# Patient Record
Sex: Male | Born: 1950
Health system: Southern US, Community
[De-identification: ages and names within clinical notes are randomized; demographics above are authoritative.]

## PROBLEM LIST (undated history)

## (undated) DIAGNOSIS — M5136 Other intervertebral disc degeneration, lumbar region: Secondary | ICD-10-CM

## (undated) DIAGNOSIS — I454 Nonspecific intraventricular block: Secondary | ICD-10-CM

## (undated) DIAGNOSIS — C434 Malignant melanoma of scalp and neck: Secondary | ICD-10-CM

## (undated) DIAGNOSIS — E7800 Pure hypercholesterolemia, unspecified: Secondary | ICD-10-CM

## (undated) DIAGNOSIS — G43109 Migraine with aura, not intractable, without status migrainosus: Secondary | ICD-10-CM

## (undated) DIAGNOSIS — R0683 Snoring: Secondary | ICD-10-CM

## (undated) DIAGNOSIS — M1990 Unspecified osteoarthritis, unspecified site: Secondary | ICD-10-CM

## (undated) DIAGNOSIS — F32A Depression, unspecified: Secondary | ICD-10-CM

## (undated) DIAGNOSIS — I10 Essential (primary) hypertension: Secondary | ICD-10-CM

## (undated) DIAGNOSIS — K2270 Barrett's esophagus without dysplasia: Secondary | ICD-10-CM

## (undated) DIAGNOSIS — R011 Cardiac murmur, unspecified: Secondary | ICD-10-CM

## (undated) DIAGNOSIS — M069 Rheumatoid arthritis, unspecified: Secondary | ICD-10-CM

## (undated) DIAGNOSIS — R7611 Nonspecific reaction to tuberculin skin test without active tuberculosis: Secondary | ICD-10-CM

## (undated) DIAGNOSIS — K219 Gastro-esophageal reflux disease without esophagitis: Secondary | ICD-10-CM

## (undated) DIAGNOSIS — G5600 Carpal tunnel syndrome, unspecified upper limb: Secondary | ICD-10-CM

## (undated) DIAGNOSIS — F329 Major depressive disorder, single episode, unspecified: Secondary | ICD-10-CM

## (undated) DIAGNOSIS — G4730 Sleep apnea, unspecified: Secondary | ICD-10-CM

## (undated) DIAGNOSIS — Z9884 Bariatric surgery status: Secondary | ICD-10-CM

## (undated) DIAGNOSIS — R0602 Shortness of breath: Secondary | ICD-10-CM

## (undated) DIAGNOSIS — I499 Cardiac arrhythmia, unspecified: Secondary | ICD-10-CM

## (undated) DIAGNOSIS — M792 Neuralgia and neuritis, unspecified: Secondary | ICD-10-CM

## (undated) DIAGNOSIS — G2581 Restless legs syndrome: Secondary | ICD-10-CM

## (undated) DIAGNOSIS — Z973 Presence of spectacles and contact lenses: Secondary | ICD-10-CM

## (undated) DIAGNOSIS — E785 Hyperlipidemia, unspecified: Secondary | ICD-10-CM

## (undated) DIAGNOSIS — R51 Headache: Secondary | ICD-10-CM

## (undated) DIAGNOSIS — R2689 Other abnormalities of gait and mobility: Secondary | ICD-10-CM

## (undated) HISTORY — DX: Neuralgia and neuritis, unspecified: M79.2

## (undated) HISTORY — PX: KNEE ARTHROPLASTY: SHX992

## (undated) HISTORY — PX: TONSILLECTOMY: SUR1361

## (undated) HISTORY — DX: Nonspecific intraventricular block: I45.4

## (undated) HISTORY — PX: LAPAROSCOPIC GASTRIC BANDING: SHX1100

## (undated) HISTORY — PX: CIRCUMCISION: SUR203

## (undated) HISTORY — DX: Other abnormalities of gait and mobility: R26.89

## (undated) HISTORY — PX: BASAL CELL CARCINOMA EXCISION: SHX1214

## (undated) HISTORY — DX: Essential (primary) hypertension: I10

## (undated) HISTORY — PX: OTHER SURGICAL HISTORY: SHX169

## (undated) HISTORY — DX: Malignant melanoma of scalp and neck: C43.4

## (undated) HISTORY — DX: Rheumatoid arthritis, unspecified: M06.9

## (undated) HISTORY — PX: UPPER GI ENDOSCOPY: SHX6162

## (undated) HISTORY — DX: Pure hypercholesterolemia, unspecified: E78.00

## (undated) HISTORY — DX: Other intervertebral disc degeneration, lumbar region: M51.36

## (undated) HISTORY — DX: Migraine with aura, not intractable, without status migrainosus: G43.109

---

## 1982-05-22 HISTORY — PX: EXCISION MELANOMA WITH SENTINEL LYMPH NODE BIOPSY: SHX5628

## 2000-12-28 ENCOUNTER — Encounter: Admission: RE | Admit: 2000-12-28 | Discharge: 2001-03-28 | Payer: Self-pay | Admitting: *Deleted

## 2002-04-10 ENCOUNTER — Encounter: Admission: RE | Admit: 2002-04-10 | Discharge: 2002-04-10 | Payer: Self-pay | Admitting: Otolaryngology

## 2002-04-10 ENCOUNTER — Encounter: Payer: Self-pay | Admitting: Otolaryngology

## 2002-05-09 ENCOUNTER — Encounter: Admission: RE | Admit: 2002-05-09 | Discharge: 2002-05-09 | Payer: Self-pay | Admitting: Family Medicine

## 2002-05-09 ENCOUNTER — Encounter: Payer: Self-pay | Admitting: Family Medicine

## 2002-05-22 HISTORY — PX: MUSCLE BIOPSY: SHX716

## 2004-06-16 ENCOUNTER — Encounter: Admission: RE | Admit: 2004-06-16 | Discharge: 2004-06-16 | Payer: Self-pay | Admitting: Family Medicine

## 2004-10-26 ENCOUNTER — Encounter: Admission: RE | Admit: 2004-10-26 | Discharge: 2004-10-26 | Payer: Self-pay | Admitting: Family Medicine

## 2004-11-03 ENCOUNTER — Encounter: Admission: RE | Admit: 2004-11-03 | Discharge: 2004-11-03 | Payer: Self-pay | Admitting: Family Medicine

## 2008-11-12 ENCOUNTER — Ambulatory Visit (HOSPITAL_COMMUNITY): Admission: RE | Admit: 2008-11-12 | Discharge: 2008-11-12 | Payer: Self-pay | Admitting: Surgery

## 2008-11-13 ENCOUNTER — Ambulatory Visit (HOSPITAL_COMMUNITY): Admission: RE | Admit: 2008-11-13 | Discharge: 2008-11-13 | Payer: Self-pay | Admitting: Surgery

## 2008-11-20 ENCOUNTER — Encounter: Admission: RE | Admit: 2008-11-20 | Discharge: 2008-11-20 | Payer: Self-pay | Admitting: Surgery

## 2008-11-30 ENCOUNTER — Ambulatory Visit (HOSPITAL_COMMUNITY): Admission: RE | Admit: 2008-11-30 | Discharge: 2008-11-30 | Payer: Self-pay | Admitting: Surgery

## 2009-02-18 ENCOUNTER — Encounter: Admission: RE | Admit: 2009-02-18 | Discharge: 2009-05-19 | Payer: Self-pay | Admitting: Surgery

## 2009-03-13 ENCOUNTER — Emergency Department (HOSPITAL_BASED_OUTPATIENT_CLINIC_OR_DEPARTMENT_OTHER): Admission: EM | Admit: 2009-03-13 | Discharge: 2009-03-13 | Payer: Self-pay | Admitting: Emergency Medicine

## 2009-03-15 ENCOUNTER — Ambulatory Visit (HOSPITAL_COMMUNITY): Admission: RE | Admit: 2009-03-15 | Discharge: 2009-03-16 | Payer: Self-pay | Admitting: Surgery

## 2009-03-15 HISTORY — PX: LAPAROSCOPIC GASTRIC BANDING: SHX1100

## 2009-10-25 ENCOUNTER — Encounter: Admission: RE | Admit: 2009-10-25 | Discharge: 2009-10-25 | Payer: Self-pay | Admitting: Surgery

## 2010-08-25 LAB — COMPREHENSIVE METABOLIC PANEL
ALT: 41 U/L (ref 0–53)
AST: 31 U/L (ref 0–37)
Albumin: 4 g/dL (ref 3.5–5.2)
Alkaline Phosphatase: 60 U/L (ref 39–117)
BUN: 18 mg/dL (ref 6–23)
CO2: 27 mEq/L (ref 19–32)
Calcium: 9.9 mg/dL (ref 8.4–10.5)
Chloride: 103 mEq/L (ref 96–112)
Creatinine, Ser: 1.09 mg/dL (ref 0.4–1.5)
GFR calc Af Amer: >60 mL/min (ref >60)
GFR calc non Af Amer: >60 mL/min (ref >60)
Glucose, Bld: 88 mg/dL (ref 70–99)
Potassium: 4.9 mEq/L (ref 3.5–5.1)
Sodium: 137 mEq/L (ref 135–145)
Total Bilirubin: 1 mg/dL (ref 0.3–1.2)
Total Protein: 7.3 g/dL (ref 6.0–8.3)

## 2010-08-25 LAB — DIFFERENTIAL
Basophils Absolute: 0 10*3/uL (ref 0.0–0.1)
Basophils Absolute: 0 10*3/uL (ref 0.0–0.1)
Basophils Relative: 1 % (ref 0–1)
Basophils Relative: 1 % (ref 0–1)
Eosinophils Absolute: 0.2 10*3/uL (ref 0.0–0.7)
Eosinophils Absolute: 0.2 10*3/uL (ref 0.0–0.7)
Eosinophils Relative: 2 % (ref 0–5)
Eosinophils Relative: 3 % (ref 0–5)
Lymphocytes Relative: 32 % (ref 12–46)
Lymphocytes Relative: 34 % (ref 12–46)
Lymphs Abs: 2 10*3/uL (ref 0.7–4.0)
Lymphs Abs: 2.6 10*3/uL (ref 0.7–4.0)
Monocytes Absolute: 0.6 10*3/uL (ref 0.1–1.0)
Monocytes Absolute: 0.8 10*3/uL (ref 0.1–1.0)
Monocytes Relative: 10 % (ref 3–12)
Monocytes Relative: 11 % (ref 3–12)
Neutro Abs: 3.1 10*3/uL (ref 1.7–7.7)
Neutro Abs: 4.7 10*3/uL (ref 1.7–7.7)
Neutrophils Relative %: 52 % (ref 43–77)
Neutrophils Relative %: 56 % (ref 43–77)

## 2010-08-25 LAB — CBC
HCT: 42.9 % (ref 39.0–52.0)
HCT: 46.9 % (ref 39.0–52.0)
Hemoglobin: 14.8 g/dL (ref 13.0–17.0)
Hemoglobin: 16.3 g/dL (ref 13.0–17.0)
MCHC: 34.4 g/dL (ref 30.0–36.0)
MCHC: 34.7 g/dL (ref 30.0–36.0)
MCV: 92.7 fL (ref 78.0–100.0)
MCV: 93.4 fL (ref 78.0–100.0)
Platelets: 198 10*3/uL (ref 150–400)
Platelets: 218 10*3/uL (ref 150–400)
RBC: 4.59 MIL/uL (ref 4.22–5.81)
RBC: 5.06 MIL/uL (ref 4.22–5.81)
RDW: 13.4 % (ref 11.5–15.5)
RDW: 14.2 % (ref 11.5–15.5)
WBC: 5.9 10*3/uL (ref 4.0–10.5)
WBC: 8.3 10*3/uL (ref 4.0–10.5)

## 2010-08-25 LAB — HEMOGLOBIN AND HEMATOCRIT, BLOOD
HCT: 45 % (ref 39.0–52.0)
Hemoglobin: 15.3 g/dL (ref 13.0–17.0)

## 2010-11-29 ENCOUNTER — Other Ambulatory Visit: Payer: Self-pay | Admitting: Orthopedic Surgery

## 2010-11-29 ENCOUNTER — Other Ambulatory Visit (HOSPITAL_COMMUNITY): Payer: Self-pay | Admitting: Orthopedic Surgery

## 2010-11-29 ENCOUNTER — Ambulatory Visit (HOSPITAL_COMMUNITY)
Admission: RE | Admit: 2010-11-29 | Discharge: 2010-11-29 | Disposition: A | Discharge status: Home / Self Care | Payer: BC Managed Care – PPO | Source: Ambulatory Visit | Attending: Orthopedic Surgery | Admitting: Orthopedic Surgery

## 2010-11-29 ENCOUNTER — Encounter (HOSPITAL_COMMUNITY): Payer: BC Managed Care – PPO

## 2010-11-29 DIAGNOSIS — M1710 Unilateral primary osteoarthritis, unspecified knee: Secondary | ICD-10-CM

## 2010-11-29 DIAGNOSIS — Z01812 Encounter for preprocedural laboratory examination: Secondary | ICD-10-CM | POA: Insufficient documentation

## 2010-11-29 DIAGNOSIS — Z01818 Encounter for other preprocedural examination: Secondary | ICD-10-CM | POA: Insufficient documentation

## 2010-11-29 DIAGNOSIS — IMO0002 Reserved for concepts with insufficient information to code with codable children: Secondary | ICD-10-CM | POA: Insufficient documentation

## 2010-11-29 DIAGNOSIS — Z01810 Encounter for preprocedural cardiovascular examination: Secondary | ICD-10-CM | POA: Insufficient documentation

## 2010-11-29 LAB — BASIC METABOLIC PANEL
BUN: 17 mg/dL (ref 6–23)
CO2: 33 mEq/L — ABNORMAL HIGH (ref 19–32)
Calcium: 10.1 mg/dL (ref 8.4–10.5)
Chloride: 100 mEq/L (ref 96–112)
Creatinine, Ser: 0.94 mg/dL (ref 0.50–1.35)
GFR calc Af Amer: >60 mL/min (ref >60)
GFR calc non Af Amer: >60 mL/min (ref >60)
Glucose, Bld: 91 mg/dL (ref 70–99)
Potassium: 4.2 mEq/L (ref 3.5–5.1)
Sodium: 139 mEq/L (ref 135–145)

## 2010-11-29 LAB — CBC
HCT: 48.2 % (ref 39.0–52.0)
Hemoglobin: 16.1 g/dL (ref 13.0–17.0)
MCH: 30 pg (ref 26.0–34.0)
MCHC: 33.4 g/dL (ref 30.0–36.0)
MCV: 89.8 fL (ref 78.0–100.0)
Platelets: 261 10*3/uL (ref 150–400)
RBC: 5.37 MIL/uL (ref 4.22–5.81)
RDW: 13.4 % (ref 11.5–15.5)
WBC: 8.1 10*3/uL (ref 4.0–10.5)

## 2010-11-29 LAB — SURGICAL PCR SCREEN
MRSA, PCR: NEGATIVE
Staphylococcus aureus: NEGATIVE

## 2010-11-29 LAB — URINALYSIS, ROUTINE W REFLEX MICROSCOPIC
Bilirubin Urine: NEGATIVE
Glucose, UA: NEGATIVE mg/dL
Hgb urine dipstick: NEGATIVE
Ketones, ur: NEGATIVE mg/dL
Leukocytes, UA: NEGATIVE
Nitrite: NEGATIVE
Protein, ur: NEGATIVE mg/dL
Specific Gravity, Urine: 1.026 (ref 1.005–1.030)
Urobilinogen, UA: 0.2 mg/dL (ref 0.0–1.0)
pH: 6 (ref 5.0–8.0)

## 2010-11-29 LAB — DIFFERENTIAL
Basophils Absolute: 0.1 10*3/uL (ref 0.0–0.1)
Basophils Relative: 1 % (ref 0–1)
Eosinophils Absolute: 0.3 10*3/uL (ref 0.0–0.7)
Eosinophils Relative: 3 % (ref 0–5)
Lymphocytes Relative: 35 % (ref 12–46)
Lymphs Abs: 2.8 10*3/uL (ref 0.7–4.0)
Monocytes Absolute: 0.8 10*3/uL (ref 0.1–1.0)
Monocytes Relative: 10 % (ref 3–12)
Neutro Abs: 4.2 10*3/uL (ref 1.7–7.7)
Neutrophils Relative %: 51 % (ref 43–77)

## 2010-11-29 LAB — APTT: aPTT: 33 seconds (ref 24–37)

## 2010-11-29 LAB — PROTIME-INR
INR: 0.84 (ref 0.00–1.49)
Prothrombin Time: 11.7 seconds (ref 11.6–15.2)

## 2010-12-02 NOTE — H&P (Unsigned)
NAME:  David [name], David [name]                     ACCOUNT NO.:  MEDICAL RECORD NO.:  LOCATION:                                 FACILITY:  PHYSICIAN:  Madlyn Frankel. Charlann Boxer, M.D.  DATE OF BIRTH:  05/27/1950  DATE OF ADMISSION: DATE OF DISCHARGE:                             HISTORY & PHYSICAL   MEDICAL DOCTOR:  Joycelyn Rua, MD  The patient is a candidate for tranexamic acid and will receive that at surgery.  He is not given his home medicines today as he will be probably going to a rehab facility postop.  CHIEF COMPLAINT:  Bilateral knee osteoarthritis.  HISTORY OF PRESENT ILLNESS:  This is a 60 year old gentleman with a history of bilateral knee osteoarthritis that has failed conservative management.  After discussion of treatments, benefits, risks, and options, the patient is now scheduled for bilateral total knee arthroplasty.  He understands the increased risk of complications with bilateral total knee arthroplasty including DVT, need for transfusion. He understands these risks and wishes to proceed.  Surgery will go ahead as scheduled.  PAST MEDICAL HISTORY:  Drug allergy to PENICILLIN with a rash, also DILAUDID with significant itching.  CURRENT MEDICATIONS: 1. Hydrochlorothiazide 25 mg 1 daily. 2. Vytorin 10/20 one daily. 3. Ropinirole 2 mg 1 at bedtime. 4. Cymbalta 60 mg 1 daily. 5. Citracal 500 mg 1 daily. 6. Glucosamine 1500 mg 1 daily. 7. Percocet 5/325 one q. 6 p.r.n. pain.  Serious medical illnesses include: 1. Hypertension. 2. Hyperlipidemia. 3. Restless legs syndrome.  Previous surgeries include: 1. Radical right posterior neck dissection in 1984, for melanoma. 2. Muscle biopsy, right thigh. 3. Lap band surgery by Dr. Ezzard Standing.  FAMILY HISTORY:  Positive for coronary artery disease and stomach cancer.  SOCIAL HISTORY:  The patient is married.  He is a Production designer, theatre/television/film at [employer].  He does not smoke and drinks rarely.  REVIEW OF SYSTEMS:  CENTRAL NERVOUS  SYSTEM:  Negative for headache, blurred vision, or dizziness.  Positive for history of shingles. PULMONARY:  Negative for shortness of breath, PND, or orthopnea.  He did have a history of sleep apnea before his lap band surgery, but has been taken off his CPAP machine since his lap band surgery and weight loss. CARDIOVASCULAR:  Negative for chest pain or palpitation.  GI:  Negative for ulcers, hepatitis, but positive for reflux and Barrett esophagitis which is better since his lap band surgery.  MUSCULOSKELETAL:  Positive as in HPI.  PHYSICAL EXAMINATION:  VITAL SIGNS:  BP 124/82, respirations 16, pulse 78 and regular. GENERAL APPEARANCE:  This is a well-developed, well-nourished gentleman in no acute distress. HEENT:  Head normocephalic.  Nose patent.  Ears patent.  Pupils equal, round, and reactive to light.  Throat without injection. NECK:  Supple without adenopathy.  Carotids 2+ without bruit.  Well- healed scar posterior neck. CHEST:  Clear to auscultation.  No rales or rhonchi.  Respirations 16. HEART:  Regular rate and rhythm at 78 beats without murmur. ABDOMEN:  Soft.  Active bowel sounds.  No masses or organomegaly. NEUROLOGIC:  The patient is alert and oriented to time, place, and person.  Cranial nerves II  through XII grossly intact. EXTREMITIES:  Bilateral knees with osteoarthritis, varus deformity. Neurovascular status is intact.  IMPRESSION:  Bilateral knee osteoarthritis.  PLAN:  Bilateral total knee arthroplasty.  Note that the patient will probably want to go to a rehab facility and will get a rehab consult from the hospital and also social work consult in case he cannot go to rehab.     Jaquelyn Bitter. Gentry Pilson, P.A.   ______________________________ Madlyn Frankel Charlann Boxer, M.D.    SJC/MEDQ  D:  11/30/2010  T:  11/30/2010  Job:  [id number]

## 2010-12-05 ENCOUNTER — Inpatient Hospital Stay (HOSPITAL_COMMUNITY)
Admission: RE | Admit: 2010-12-05 | Discharge: 2010-12-12 | DRG: 471 | Disposition: A | Discharge status: Skilled Nursing Facility | Payer: BC Managed Care – PPO | Source: Ambulatory Visit | Attending: Orthopedic Surgery | Admitting: Orthopedic Surgery

## 2010-12-05 DIAGNOSIS — T50995A Adverse effect of other drugs, medicaments and biological substances, initial encounter: Secondary | ICD-10-CM | POA: Diagnosis not present

## 2010-12-05 DIAGNOSIS — E7800 Pure hypercholesterolemia, unspecified: Secondary | ICD-10-CM | POA: Diagnosis present

## 2010-12-05 DIAGNOSIS — M1710 Unilateral primary osteoarthritis, unspecified knee: Principal | ICD-10-CM | POA: Diagnosis present

## 2010-12-05 DIAGNOSIS — I10 Essential (primary) hypertension: Secondary | ICD-10-CM | POA: Diagnosis present

## 2010-12-05 DIAGNOSIS — K219 Gastro-esophageal reflux disease without esophagitis: Secondary | ICD-10-CM | POA: Diagnosis present

## 2010-12-05 DIAGNOSIS — G8918 Other acute postprocedural pain: Secondary | ICD-10-CM | POA: Diagnosis not present

## 2010-12-05 DIAGNOSIS — H5316 Psychophysical visual disturbances: Secondary | ICD-10-CM | POA: Diagnosis not present

## 2010-12-05 DIAGNOSIS — F19921 Other psychoactive substance use, unspecified with intoxication with delirium: Secondary | ICD-10-CM | POA: Diagnosis not present

## 2010-12-05 DIAGNOSIS — Z01812 Encounter for preprocedural laboratory examination: Secondary | ICD-10-CM

## 2010-12-05 DIAGNOSIS — F3289 Other specified depressive episodes: Secondary | ICD-10-CM | POA: Diagnosis present

## 2010-12-05 DIAGNOSIS — F329 Major depressive disorder, single episode, unspecified: Secondary | ICD-10-CM | POA: Diagnosis present

## 2010-12-05 HISTORY — PX: TOTAL KNEE ARTHROPLASTY: SHX125

## 2010-12-05 LAB — TYPE AND SCREEN
ABO/RH(D): A POS
Antibody Screen: NEGATIVE

## 2010-12-05 LAB — ABO/RH: ABO/RH(D): A POS

## 2010-12-05 NOTE — Op Note (Signed)
NAMECHASTON, BRADBURN NO.:  [id number]  MEDICAL RECORD NO.:  [id number]  LOCATION:  0007                         FACILITY:  [facility]  PHYSICIAN:  Madlyn Frankel. Charlann Boxer, M.D.  DATE OF BIRTH:  1950-06-24  DATE OF PROCEDURE:  12/05/2010 DATE OF DISCHARGE:                              OPERATIVE REPORT   PREOPERATIVE DIAGNOSIS:  Bilateral knee osteoarthritis.  POSTOPERATIVE DIAGNOSIS:  Bilateral knee osteoarthritis.  PROCEDURE:  Bilateral total knee replacement.  COMPONENTS USED:  Left total knee replacement with a size 4 narrow femur, 4 tibia, 41 patellar button and 12.5 insert.  The right knee was 4 narrow femur, 4 tibia, 15 mm insert with a 41 patellar button.  SURGEON:  Madlyn Frankel. Charlann Boxer, M.D.  ASSISTANT:  Jaquelyn Bitter. Chabon, P.A.  ANESTHESIA:  Epidural catheter plus MAC anesthesia.  SPECIMENS:  None.  COMPLICATIONS:  None.  DRAINS:  One per knee.  TOURNIQUET TIME:  36 minutes at 250 mmHg on the left knee and 43 minutes at 250 mmHg on the right knee.  INDICATIONS:  Mr. Sobecki is a 60 year old gentleman who presented to the office for surgical consultation of bilateral knee osteoarthritis.  He had failed conservative measures and injections.  His quality of life is poor at this point.  He is ready to proceed with more definitive measures.  He wished to proceed with bilateral total knee replacement, after reviewing risks and benefits this was his choice.  Risks and benefits were discussed, benefit for pain relief was chosen for and consent obtained.  PROCEDURE IN DETAIL:  The patient was brought to operative theater. Once adequate anesthesia, preoperative antibiotics, 2 g Ancef administered, the patient was positioned supine and bilateral thigh tourniquet placed.  Bilateral lower extremity was then prepped and draped simultaneously for simultaneous procedure.  Attention was first directed to left knee.  Following the prep and drape, a time-out was  performed, identifying the procedure for the left total knee, identifying the patient, planned procedure in this initial procedure.  Leg was exsanguinated, tourniquet elevated to 250 mmHg.  We did use a Mayo leg holder.  Midline incision was made followed by median arthrotomy.  Following initial exposure, attention was directed to the patella.  Precut measurement was 23 mm or 24 mm.  I resected down to about 14 mm and used a 41 patellar button to restore patellar height. Lug holes were drilled and a metal shim placed to protect the patella from retractors and saw blades.  At this point, the femoral canal was opened and drilled, irrigated to try to prevent fat emboli.  Intramedullary canal was irrigated. Intramedullary rod was passed, 5 degrees of valgus, 11 mm bone resected off the distal femur.  Following this resection, the tibia was subluxated anteriorly.  Using extramedullary guide, measured resection of 10 mm of proximal lateral tibia was carried out.  Following this resection, we confirmed that the medial, lateral and collateral ligaments were stable from extension with 10-mm insert.  At this point, sized the femur to be a size 4 from anterior-posterior as mentioned.  Chose a narrow component based on medial lateral dimensions. The size 4 rotation block was then pinned into position with anterior  reference using a C-clamp to set rotation.  The 4 in 1 cutting block was then placed.  Anterior, posterior and chamfer cuts made without difficulty nor notching.  Final box cut was made off the lateral aspect of distal femur. Following this resection, attention was directed back to the tibia.  The tibial was best fit with a size 4 tibial tray and was pinned into position, drilled and keel punched.  Trial reduction was now carried out with a 4 narrow femur, 4 tibia and 12.5 insert which the knee came to full extension.  It was stable from extension to flexion and patella tracked  through the trochlea without application of pressure.  Given these findings, the trial components removed.  The synovial capsule junction, knee was injected with 25 mL of 0.25% Marcaine with epinephrine and combination of 1 mL of Toradol.  The knee was irrigated with normal saline solution and pulse lavage.  Final components were opened and cement mixed.  Final components were cemented on to clean and dried cut surfaces of bone.  The knee was brought to extension with 12.5 insert.  Tourniquet had been let down at 36 minutes without significant hemostasis.  Once the cement had fully cured, excessive cement was removed throughout the knee.  The final 12.5 insert was placed.  A medium Hemovac drain was placed deep.  We re- irrigated the knee with normal saline solution and reapproximated extensor mechanism with the knee in flexion.  The extensor mechanism was reapproximated using #1 Vicryl with the knee flexed.  The remainder of wound was closed with 2-0 Vicryl and running 4- 0 Monocryl.  The knee was cleaned, dried and dressed sterilely using Dermabond and Aquacel dressing.  Drain site dressed separately.  As this left knee was being closed, attention was now directed to the right knee.  A separate time-out was performed, identifying the patient, planned procedure and extremity after doing the instrument count.  The right lower extremity was then exsanguinated, tourniquet elevated to 250 mmHg.  Again, the Mayo leg holder utilized.  Midline incision was made followed by median arthrotomy.  Following initial exposure and debridement, attention was directed patella.  Precut measurement again was around 23 mm to 24 mm, dissected down to 13 to 14 mm, used 41 patellar button to restore height.  Lug holes were drilled and metal shim placed to protect the patella from retractors and saw blades.  Attention was now directed to femur.  Femoral canal was opened and drilled, irrigated to try to  prevent fat emboli.  Intramedullary rod was passed and again at 5 degrees valgus, 11 mm bone resected off the distal femur.  Following this resection, tibia was subluxated anteriorly and again using a measured resection off the proximal lateral tibia of 10 mm.  I confirmed the cut was perpendicular in the coronal plane as well as checked extension gap.  This gap with these cuts felt a little bit bigger to me than what the contralateral knee felt.  This was taken into consideration.  Attention was now directed back to the femur.  We used a size 4 rotation block and was then pinned in position with anterior reference using the C-clamp to set rotation.  Anterior, posterior and chamfer cuts made without difficulty nor notching.  Final box cut again made off the lateral aspect distal femur.  The tibia was then subluxated anteriorly. The cut surface again was best fit with size 4 tibial tray, was pinned in position, drilled and keel punched.  Trial reduction now carried out with 4 femur, 4 tibia and at this point initially with 12.5 insert. Felt that there may have been just a little bit of hyperextension and I was going to trial with a 15-mm insert during cementing.  At this point, all trial components removed.  The knee was irrigated with normal saline solution and pulse lavage.  The final 25 mL of 0.25% Marcaine with epinephrine was used in the synovial capsule junction. Final components were opened, holding the polyethylene insert.  Cement mixed.  Final components were cemented on clean and dried cut surfaces of bone. 15 mm insert was placed with this and knee came to full extension. Extruded cement was removed.  Once cement had fully cured, excess cement was removed throughout the knee.  The final 15-mm insert was chosen and inserted into the knee.  The tourniquet had been let down at 43 minutes without significant hemostasis required.  A medium Hemovac drain was placed deep.   The extensor mechanism was then reapproximated using #1 Vicryl with the knee in flexion.  The remainder of the wound was closed with 2-0 Vicryl and running 4-0 Monocryl.  The knee was cleaned, dried and sterilely with Dermabond and Aquacel dressing.  Drain site dressed separately.  Both knees were then wrapped in Ace wrap and sheath.  He was then brought to recovery room in stable addition, tolerating the procedure well.     Madlyn Frankel Charlann Boxer, M.D.     MDO/MEDQ  D:  12/05/2010  T:  12/05/2010  Job:  [id number]  Electronically Signed by Durene Romans M.D. on 12/05/2010 04:57:13 PM

## 2010-12-06 LAB — BASIC METABOLIC PANEL
BUN: 12 mg/dL (ref 6–23)
CO2: 29 mEq/L (ref 19–32)
Calcium: 8.3 mg/dL — ABNORMAL LOW (ref 8.4–10.5)
Chloride: 101 mEq/L (ref 96–112)
Creatinine, Ser: 1.11 mg/dL (ref 0.50–1.35)
GFR calc Af Amer: >60 mL/min (ref >60)
GFR calc non Af Amer: >60 mL/min (ref >60)
Glucose, Bld: 126 mg/dL — ABNORMAL HIGH (ref 70–99)
Potassium: 3.5 mEq/L (ref 3.5–5.1)
Sodium: 135 mEq/L (ref 135–145)

## 2010-12-06 LAB — CBC
HCT: 34.5 % — ABNORMAL LOW (ref 39.0–52.0)
Hemoglobin: 11.9 g/dL — ABNORMAL LOW (ref 13.0–17.0)
MCH: 30.4 pg (ref 26.0–34.0)
MCHC: 34.5 g/dL (ref 30.0–36.0)
MCV: 88.2 fL (ref 78.0–100.0)
Platelets: 203 10*3/uL (ref 150–400)
RBC: 3.91 MIL/uL — ABNORMAL LOW (ref 4.22–5.81)
RDW: 13.6 % (ref 11.5–15.5)
WBC: 8.1 10*3/uL (ref 4.0–10.5)

## 2010-12-07 LAB — CBC
HCT: 31.7 % — ABNORMAL LOW (ref 39.0–52.0)
Hemoglobin: 10.7 g/dL — ABNORMAL LOW (ref 13.0–17.0)
MCH: 30.5 pg (ref 26.0–34.0)
MCHC: 33.8 g/dL (ref 30.0–36.0)
MCV: 90.3 fL (ref 78.0–100.0)
Platelets: 179 10*3/uL (ref 150–400)
RBC: 3.51 MIL/uL — ABNORMAL LOW (ref 4.22–5.81)
RDW: 13.4 % (ref 11.5–15.5)
WBC: 10 10*3/uL (ref 4.0–10.5)

## 2010-12-07 LAB — BASIC METABOLIC PANEL
BUN: 10 mg/dL (ref 6–23)
CO2: 33 mEq/L — ABNORMAL HIGH (ref 19–32)
Calcium: 8.5 mg/dL (ref 8.4–10.5)
Chloride: 101 mEq/L (ref 96–112)
Creatinine, Ser: 1.04 mg/dL (ref 0.50–1.35)
GFR calc Af Amer: >60 mL/min (ref >60)
GFR calc non Af Amer: >60 mL/min (ref >60)
Glucose, Bld: 118 mg/dL — ABNORMAL HIGH (ref 70–99)
Potassium: 4.1 mEq/L (ref 3.5–5.1)
Sodium: 138 mEq/L (ref 135–145)

## 2010-12-12 NOTE — Discharge Summary (Signed)
  NAMEKEYGAN, DUMOND NO.:  [id number]  MEDICAL RECORD NO.:  [id number]  LOCATION:  1610                         FACILITY:  [facility]  PHYSICIAN:  David Frankel. Charlann [name], M.D.  DATE OF BIRTH:  1950-12-03  DATE OF ADMISSION:  12/05/2010 DATE OF DISCHARGE:                        DISCHARGE SUMMARY - REFERRING   ADDENDUM: [name] is a 60 year old gentleman who was planned to be discharged on December 09, 2010, to skilled nursing facility for postop rehab for his bilateral knees.  However, upon evaluation, there have been some concerns about some visual hallucinations most likely felt to be narcotic based and the nursing facility did not feel they were able to take the patient.  Over the weekend, his visual hallucinations have cleared as to be expected.  We did change him over to Norco from oxycodone even though he had been on Percocet preoperatively.  By Monday, December 12, 2010, he was doing significant better and he was ready to be discharged to skilled nursing facility.  DISCHARGE CONDITION:  Unchanged.  He has remained stable and improved.  PLAN:  He will be discharged to skilled nursing facility for physical therapy, medications and follow-up plans are unchanged at this point.     David [name], M.D.     MDO/MEDQ  D:  12/12/2010  T:  12/12/2010  Job:  [id number]  Electronically Signed by Durene Romans M.D. on 12/12/2010 10:15:19 AM

## 2010-12-24 NOTE — Discharge Summary (Signed)
  David [name], SHOUSE NO.:  [id number]  MEDICAL RECORD NO.:  [id number]  LOCATION:  1610                         FACILITY:  [facility]  PHYSICIAN:  Madlyn Frankel. Charlann Boxer, M.D.  DATE OF BIRTH:  12-Aug-1950  DATE OF ADMISSION:  12/05/2010 DATE OF DISCHARGE:  12/09/2010                        DISCHARGE SUMMARY - REFERRING   ADMITTING DIAGNOSIS:  Bilateral knee osteoarthritis.  DISCHARGE DIAGNOSES: 1. Hypertension. 2. Reflux disease. 3. Sleep apnea. 4. Hypercholesterolemia. 5. Depression. 6. Status post bilateral total knee arthroplasties.  HISTORY OF PRESENT ILLNESS:  The patient is a 60 year old white male with history of bilateral knee pain with osteoarthritis.  After the failure of conservative treatment, options were discussed with the patient, the patient wishes to proceed with bilateral knee replacements. Risks, benefits and expectations were discussed with the patient.  He understands.  The patient underwent bilateral TKAs on December 05, 2010. The patient tolerated the procedure well, was brought to the recovery room in good condition, subsequently to the floor.  The patient was in the hospital for 4 days.  At that time, the patient was afebrile, vital signs stable.  Her electrolytes and labs were relatively stable throughout the whole visit.  The patient initially was doing really well with the epidural in place.  After the epidural was removed, the patient started complaining of significant pain.  The patient also suffers from some confusion while being in the hospital.  The patient did better while on oxygen to help perfuse better.  The patient is going to maintain his oxygen while at skilled nursing facility to keep his saturations above 90%, to help confusion.  Otherwise, the patient was doing well.  The patient will be discharged to skilled nursing facility today on December 09, 2010.  The patient has dressings in place, he will have them for about 8 days and  after that change to gauze and tape.  The patient will follow up with Dr. Charlann Boxer in the [facility] in 2 weeks. Appointment has already been made.  The patient will be weightbearing as tolerated on both legs.  DISCHARGE MEDICATIONS: 1. Colace 100 mg 1 p.o. b.i.d. 2. Aspirin 325 mg 1 p.o. b.i.d. for 30 days. 3. Robaxin 500 mg 1 p.o. q.6 h. p.r.n. 4. Oxycodone 5 mg to 15 mg p.o. q.4-6 h. p.r.n. pain. 5. MiraLax 17 g p.o. b.i.d. p.r.n. 6. Xarelto 10 mg 1 p.o. daily for 12 days. 7. Calcium citrate 2 tabs p.o. q.a.m. 8. Colace 100 mg 3 p.o. q.h.s. 9. Cymbalta 60 mg 1 p.o. q.h.s. 10.Glucosamine 1500 mg 1 p.o. q.a.m. 11.HCTZ 25 mg 1 p.o. q.h.s. 12.Pennsaid 1 application topically q.i.d. 13.Requip 2 mg 1 p.o. q.h.s. 14.Vytorin 10/20 mg 1 p.o. q.h.s.    ______________________________ Lanney Gins, PA   ______________________________ Madlyn Frankel. Charlann Boxer, M.D.    MB/MEDQ  D:  12/09/2010  T:  12/09/2010  Job:  [id number]  Electronically Signed by Lanney Gins PA on 12/21/2010 01:36:18 PM Electronically Signed by Durene Romans M.D. on 12/24/2010 09:17:43 AM

## 2011-09-14 ENCOUNTER — Telehealth (INDEPENDENT_AMBULATORY_CARE_PROVIDER_SITE_OTHER): Payer: Self-pay | Admitting: Surgery

## 2011-09-14 NOTE — Telephone Encounter (Signed)
09/14/11 mailed recall letter to patient for bariatric surgery follow-up. Advised patient to call [facility] @ [phone] to schedule an appointment. cef 

## 2012-09-23 ENCOUNTER — Encounter (HOSPITAL_BASED_OUTPATIENT_CLINIC_OR_DEPARTMENT_OTHER): Payer: Self-pay | Admitting: *Deleted

## 2012-09-23 ENCOUNTER — Other Ambulatory Visit: Payer: Self-pay | Admitting: Orthopedic Surgery

## 2012-09-23 NOTE — Progress Notes (Signed)
Pt had bilat t9otal knees 2012-saw dr Mayford Knife for sleep apnea and tachy rhythum after steroids-no meds-not seen since-cannot use a cpap- To come in for bmet-ekg-

## 2012-09-25 ENCOUNTER — Encounter (HOSPITAL_BASED_OUTPATIENT_CLINIC_OR_DEPARTMENT_OTHER)
Admission: RE | Admit: 2012-09-25 | Discharge: 2012-09-25 | Disposition: A | Discharge status: Home / Self Care | Payer: BC Managed Care – PPO | Source: Ambulatory Visit | Attending: Orthopedic Surgery | Admitting: Orthopedic Surgery

## 2012-09-25 LAB — BASIC METABOLIC PANEL
BUN: 17 mg/dL (ref 6–23)
CO2: 25 mEq/L (ref 19–32)
Calcium: 9.5 mg/dL (ref 8.4–10.5)
Chloride: 101 mEq/L (ref 96–112)
Creatinine, Ser: 0.97 mg/dL (ref 0.50–1.35)
GFR calc Af Amer: >90 mL/min (ref >90)
GFR calc non Af Amer: 88 mL/min — ABNORMAL LOW (ref >90)
Glucose, Bld: 115 mg/dL — ABNORMAL HIGH (ref 70–99)
Potassium: 4.3 mEq/L (ref 3.5–5.1)
Sodium: 135 mEq/L (ref 135–145)

## 2012-09-26 ENCOUNTER — Encounter (HOSPITAL_BASED_OUTPATIENT_CLINIC_OR_DEPARTMENT_OTHER): Payer: Self-pay | Admitting: *Deleted

## 2012-09-26 ENCOUNTER — Ambulatory Visit (HOSPITAL_BASED_OUTPATIENT_CLINIC_OR_DEPARTMENT_OTHER): Payer: BC Managed Care – PPO | Admitting: Anesthesiology

## 2012-09-26 ENCOUNTER — Encounter (HOSPITAL_BASED_OUTPATIENT_CLINIC_OR_DEPARTMENT_OTHER)
Admission: RE | Disposition: A | Discharge status: Home / Self Care | Payer: Self-pay | Source: Ambulatory Visit | Attending: Orthopedic Surgery

## 2012-09-26 ENCOUNTER — Ambulatory Visit (HOSPITAL_BASED_OUTPATIENT_CLINIC_OR_DEPARTMENT_OTHER)
Admission: RE | Admit: 2012-09-26 | Discharge: 2012-09-26 | Disposition: A | Discharge status: Home / Self Care | Payer: BC Managed Care – PPO | Source: Ambulatory Visit | Attending: Orthopedic Surgery | Admitting: Orthopedic Surgery

## 2012-09-26 ENCOUNTER — Encounter (HOSPITAL_BASED_OUTPATIENT_CLINIC_OR_DEPARTMENT_OTHER): Payer: Self-pay | Admitting: Anesthesiology

## 2012-09-26 DIAGNOSIS — Z885 Allergy status to narcotic agent status: Secondary | ICD-10-CM | POA: Insufficient documentation

## 2012-09-26 DIAGNOSIS — Z880 Allergy status to penicillin: Secondary | ICD-10-CM | POA: Insufficient documentation

## 2012-09-26 DIAGNOSIS — Z9884 Bariatric surgery status: Secondary | ICD-10-CM | POA: Insufficient documentation

## 2012-09-26 DIAGNOSIS — K219 Gastro-esophageal reflux disease without esophagitis: Secondary | ICD-10-CM | POA: Insufficient documentation

## 2012-09-26 DIAGNOSIS — E785 Hyperlipidemia, unspecified: Secondary | ICD-10-CM | POA: Insufficient documentation

## 2012-09-26 DIAGNOSIS — R0989 Other specified symptoms and signs involving the circulatory and respiratory systems: Secondary | ICD-10-CM | POA: Insufficient documentation

## 2012-09-26 DIAGNOSIS — Z87891 Personal history of nicotine dependence: Secondary | ICD-10-CM | POA: Insufficient documentation

## 2012-09-26 DIAGNOSIS — R0609 Other forms of dyspnea: Secondary | ICD-10-CM | POA: Insufficient documentation

## 2012-09-26 DIAGNOSIS — I10 Essential (primary) hypertension: Secondary | ICD-10-CM | POA: Insufficient documentation

## 2012-09-26 DIAGNOSIS — G2581 Restless legs syndrome: Secondary | ICD-10-CM | POA: Insufficient documentation

## 2012-09-26 DIAGNOSIS — F329 Major depressive disorder, single episode, unspecified: Secondary | ICD-10-CM | POA: Insufficient documentation

## 2012-09-26 DIAGNOSIS — Z79899 Other long term (current) drug therapy: Secondary | ICD-10-CM | POA: Insufficient documentation

## 2012-09-26 DIAGNOSIS — F3289 Other specified depressive episodes: Secondary | ICD-10-CM | POA: Insufficient documentation

## 2012-09-26 DIAGNOSIS — M6530 Trigger finger, unspecified finger: Secondary | ICD-10-CM | POA: Insufficient documentation

## 2012-09-26 DIAGNOSIS — Z6837 Body mass index (BMI) 37.0-37.9, adult: Secondary | ICD-10-CM | POA: Insufficient documentation

## 2012-09-26 DIAGNOSIS — G5600 Carpal tunnel syndrome, unspecified upper limb: Secondary | ICD-10-CM | POA: Insufficient documentation

## 2012-09-26 HISTORY — DX: Gastro-esophageal reflux disease without esophagitis: K21.9

## 2012-09-26 HISTORY — DX: Restless legs syndrome: G25.81

## 2012-09-26 HISTORY — DX: Essential (primary) hypertension: I10

## 2012-09-26 HISTORY — PX: CARPAL TUNNEL RELEASE: SHX101

## 2012-09-26 HISTORY — DX: Sleep apnea, unspecified: G47.30

## 2012-09-26 HISTORY — DX: Shortness of breath: R06.02

## 2012-09-26 HISTORY — DX: Cardiac arrhythmia, unspecified: I49.9

## 2012-09-26 HISTORY — DX: Unspecified osteoarthritis, unspecified site: M19.90

## 2012-09-26 HISTORY — PX: TRIGGER FINGER RELEASE: SHX641

## 2012-09-26 HISTORY — DX: Depression, unspecified: F32.A

## 2012-09-26 HISTORY — DX: Bariatric surgery status: Z98.84

## 2012-09-26 HISTORY — DX: Carpal tunnel syndrome, unspecified upper limb: G56.00

## 2012-09-26 HISTORY — DX: Presence of spectacles and contact lenses: Z97.3

## 2012-09-26 HISTORY — DX: Snoring: R06.83

## 2012-09-26 HISTORY — DX: Hyperlipidemia, unspecified: E78.5

## 2012-09-26 HISTORY — DX: Major depressive disorder, single episode, unspecified: F32.9

## 2012-09-26 LAB — POCT HEMOGLOBIN-HEMACUE: Hemoglobin: 16.3 g/dL (ref 13.0–17.0)

## 2012-09-26 SURGERY — CARPAL TUNNEL RELEASE
Anesthesia: Choice | Site: Wrist | Laterality: Right | Wound class: Clean

## 2012-09-26 MED ORDER — FENTANYL CITRATE 0.05 MG/ML IJ SOLN
INTRAMUSCULAR | Status: DC | PRN
  Administered 2012-09-26: 100 ug via INTRAVENOUS
  Administered 2012-09-26: 25 ug via INTRAVENOUS

## 2012-09-26 MED ORDER — OXYCODONE-ACETAMINOPHEN 5-325 MG PO TABS: ORAL_TABLET | ORAL | Status: DC

## 2012-09-26 MED ORDER — ONDANSETRON HCL 4 MG/2ML IJ SOLN: 4.0000 mg | Freq: Once | INTRAMUSCULAR | Status: DC | PRN

## 2012-09-26 MED ORDER — CEFAZOLIN SODIUM-DEXTROSE 2-3 GM-% IV SOLR
2.0000 g | INTRAVENOUS | Status: AC
  Administered 2012-09-26: 2 g via INTRAVENOUS

## 2012-09-26 MED ORDER — ONDANSETRON HCL 4 MG/2ML IJ SOLN
INTRAMUSCULAR | Status: DC | PRN
  Administered 2012-09-26: 4 mg via INTRAVENOUS

## 2012-09-26 MED ORDER — KETOROLAC TROMETHAMINE 30 MG/ML IJ SOLN
INTRAMUSCULAR | Status: DC | PRN
  Administered 2012-09-26: 30 mg via INTRAVENOUS

## 2012-09-26 MED ORDER — BUPIVACAINE HCL (PF) 0.25 % IJ SOLN
INTRAMUSCULAR | Status: DC | PRN
  Administered 2012-09-26: 10 mL

## 2012-09-26 MED ORDER — MIDAZOLAM HCL 5 MG/5ML IJ SOLN
INTRAMUSCULAR | Status: DC | PRN
  Administered 2012-09-26: 2 mg via INTRAVENOUS

## 2012-09-26 MED ORDER — LIDOCAINE HCL (PF) 0.5 % IJ SOLN
INTRAMUSCULAR | Status: DC | PRN
  Administered 2012-09-26: 35 mL via INTRAVENOUS

## 2012-09-26 MED ORDER — PROPOFOL 10 MG/ML IV EMUL
INTRAVENOUS | Status: DC | PRN
  Administered 2012-09-26: 100 ug/kg/min via INTRAVENOUS

## 2012-09-26 MED ORDER — CHLORHEXIDINE GLUCONATE 4 % EX LIQD: 60.0000 mL | Freq: Once | CUTANEOUS | Status: DC

## 2012-09-26 MED ORDER — LIDOCAINE HCL (CARDIAC) 20 MG/ML IV SOLN
INTRAVENOUS | Status: DC | PRN
  Administered 2012-09-26: 60 mg via INTRAVENOUS

## 2012-09-26 MED ORDER — FENTANYL CITRATE 0.05 MG/ML IJ SOLN: 25.0000 ug | INTRAMUSCULAR | Status: DC | PRN

## 2012-09-26 MED ORDER — LACTATED RINGERS IV SOLN
INTRAVENOUS | Status: DC
  Administered 2012-09-26: 08:00:00 via INTRAVENOUS

## 2012-09-26 MED ORDER — DEXAMETHASONE SODIUM PHOSPHATE 10 MG/ML IJ SOLN
INTRAMUSCULAR | Status: DC | PRN
  Administered 2012-09-26: 8 mg via INTRAVENOUS

## 2012-09-26 SURGICAL SUPPLY — 34 items
BANDAGE COBAN STERILE 2 (GAUZE/BANDAGES/DRESSINGS) IMPLANT
BANDAGE CONFORM 2  STR LF (GAUZE/BANDAGES/DRESSINGS) ×3 IMPLANT
BANDAGE ELASTIC 3 VELCRO ST LF (GAUZE/BANDAGES/DRESSINGS) ×3 IMPLANT
BANDAGE GAUZE ELAST BULKY 4 IN (GAUZE/BANDAGES/DRESSINGS) ×3 IMPLANT
BLADE MINI RND TIP GREEN BEAV (BLADE) IMPLANT
BLADE SURG 15 STRL LF DISP TIS (BLADE) ×4 IMPLANT
BLADE SURG 15 STRL SS (BLADE) ×2
BNDG ELASTIC 2 VLCR STRL LF (GAUZE/BANDAGES/DRESSINGS) IMPLANT
BNDG ESMARK 4X9 LF (GAUZE/BANDAGES/DRESSINGS) IMPLANT
CHLORAPREP W/TINT 26ML (MISCELLANEOUS) ×3 IMPLANT
CLOTH BEACON ORANGE TIMEOUT ST (SAFETY) ×3 IMPLANT
CORDS BIPOLAR (ELECTRODE) ×3 IMPLANT
COVER MAYO STAND STRL (DRAPES) ×3 IMPLANT
COVER TABLE BACK 60X90 (DRAPES) ×3 IMPLANT
CUFF TOURNIQUET SINGLE 18IN (TOURNIQUET CUFF) ×3 IMPLANT
DRAPE EXTREMITY T 121X128X90 (DRAPE) ×3 IMPLANT
DRAPE SURG 17X23 STRL (DRAPES) ×3 IMPLANT
DRSG PAD ABDOMINAL 8X10 ST (GAUZE/BANDAGES/DRESSINGS) ×3 IMPLANT
GAUZE XEROFORM 1X8 LF (GAUZE/BANDAGES/DRESSINGS) ×3 IMPLANT
GLOVE BIO SURGEON STRL SZ7.5 (GLOVE) ×3 IMPLANT
GOWN BRE IMP PREV XXLGXLNG (GOWN DISPOSABLE) ×3 IMPLANT
GOWN PREVENTION PLUS XLARGE (GOWN DISPOSABLE) IMPLANT
NEEDLE HYPO 25X1 1.5 SAFETY (NEEDLE) ×3 IMPLANT
NS IRRIG 1000ML POUR BTL (IV SOLUTION) ×3 IMPLANT
PACK BASIN DAY SURGERY FS (CUSTOM PROCEDURE TRAY) ×3 IMPLANT
PADDING CAST ABS 4INX4YD NS (CAST SUPPLIES) ×1
PADDING CAST ABS COTTON 4X4 ST (CAST SUPPLIES) ×2 IMPLANT
SPONGE GAUZE 4X4 12PLY (GAUZE/BANDAGES/DRESSINGS) ×3 IMPLANT
STOCKINETTE 4X48 STRL (DRAPES) ×3 IMPLANT
SUT ETHILON 4 0 PS 2 18 (SUTURE) ×6 IMPLANT
SYR BULB 3OZ (MISCELLANEOUS) ×3 IMPLANT
SYR CONTROL 10ML LL (SYRINGE) ×3 IMPLANT
TOWEL OR 17X24 6PK STRL BLUE (TOWEL DISPOSABLE) ×6 IMPLANT
UNDERPAD 30X30 INCONTINENT (UNDERPADS AND DIAPERS) ×3 IMPLANT

## 2012-09-26 NOTE — Brief Op Note (Signed)
09/26/2012  9:37 AM  PATIENT:  David [name]  62 y.o. male  PRE-OPERATIVE DIAGNOSIS:  RIGHT CARPAL TUNNEL, RING AND SMALL TRIGGER DIGITS  POST-OPERATIVE DIAGNOSIS:  RIGHT CARPAL TUNNEL, RING AND SMALL TRIGGER DIGITS  PROCEDURE:  Procedure(s): CARPAL TUNNEL RELEASE (Right) RELEASE TRIGGER FINGER/A-1 PULLEY RING AND SMALL  (Right)  SURGEON:  Surgeon(s) and Role:    * Tami Ribas, MD - Primary  PHYSICIAN ASSISTANT:   ASSISTANTS: none   ANESTHESIA:   Bier block  EBL:  Total I/O In: 700 [I.V.:700] Out: -   BLOOD ADMINISTERED:none  DRAINS: none   LOCAL MEDICATIONS USED:  MARCAINE     SPECIMEN:  No Specimen  DISPOSITION OF SPECIMEN:  N/A  COUNTS:  YES  TOURNIQUET:   Total Tourniquet Time Documented: Forearm (Right) - 56 minutes Total: Forearm (Right) - 56 minutes   DICTATION: .Other Dictation: Dictation Number [id number]  PLAN OF CARE: Discharge to home after PACU  PATIENT DISPOSITION:  PACU - hemodynamically stable.

## 2012-09-26 NOTE — Op Note (Signed)
[id number] 

## 2012-09-26 NOTE — Anesthesia Preprocedure Evaluation (Addendum)
Anesthesia Evaluation  Patient identified by MRN, date of birth, ID band Patient awake    Reviewed: Allergy & Precautions, H&P , NPO status , Patient's Chart, lab work & pertinent test results  Airway Mallampati: I TM Distance: >3 FB Neck ROM: Full    Dental  (+) Teeth Intact and Dental Advisory Given   Pulmonary sleep apnea (Pt unable to tolerate CPAP) ,  breath sounds clear to auscultation        Cardiovascular hypertension, Pt. on medications Rhythm:Regular Rate:Normal     Neuro/Psych    GI/Hepatic GERD-  Medicated and Controlled,  Endo/Other  Morbid obesity  Renal/GU      Musculoskeletal   Abdominal   Peds  Hematology   Anesthesia Other Findings   Reproductive/Obstetrics                          Anesthesia Physical Anesthesia Plan  ASA: II  Anesthesia Plan: MAC and Bier Block   Post-op Pain Management:    Induction: Intravenous  Airway Management Planned: Simple Face Mask  Additional Equipment:   Intra-op Plan:   Post-operative Plan:   Informed Consent: I have reviewed the patients History and Physical, chart, labs and discussed the procedure including the risks, benefits and alternatives for the proposed anesthesia with the patient or authorized representative who has indicated his/her understanding and acceptance.   Dental advisory given  Plan Discussed with: CRNA, Anesthesiologist and Surgeon  Anesthesia Plan Comments:         Anesthesia Quick Evaluation

## 2012-09-26 NOTE — Anesthesia Procedure Notes (Signed)
Procedure Name: MAC Performed by: Zeb Rawl W Pre-anesthesia Checklist: Patient identified, Timeout performed, Emergency Drugs available, Suction available and Patient being monitored Oxygen Delivery Method: Simple face mask Placement Confirmation: positive ETCO2 Dental Injury: Teeth and Oropharynx as per pre-operative assessment      

## 2012-09-26 NOTE — Anesthesia Postprocedure Evaluation (Signed)
  Anesthesia Post-op Note  Patient: David [name]  Procedure(s) Performed: Procedure(s): CARPAL TUNNEL RELEASE (Right) RELEASE TRIGGER FINGER/A-1 PULLEY RING AND SMALL  (Right)  Patient Location: PACU  Anesthesia Type:MAC and Bier block  Level of Consciousness: awake, alert  and oriented  Airway and Oxygen Therapy: Patient Spontanous Breathing  Post-op Pain: mild  Post-op Assessment: Post-op Vital signs reviewed  Post-op Vital Signs: Reviewed  Complications: No apparent anesthesia complications

## 2012-09-26 NOTE — H&P (Signed)
David [name] is an 62 y.o. male.   Chief Complaint: right carpal tunnel, ring and small trigger digits HPI: 62 yo rhd male with right ring and small finger trigger digits and carpal tunnel syndrome.  Has had injection of the trigger digits x 2 without lasting relief.  Positive nerve conduction studies.  He wishes to have carpal tunnel release and ring and small finger trigger release.  Past Medical History  Diagnosis Date  . Dysrhythmia     had some tachycardia with steroids  . Hypertension   . Depression   . RLS (restless legs syndrome)   . Arthritis   . GERD (gastroesophageal reflux disease)     no meds since gastric banding  . Sleep apnea     could not use a cpap  . Shortness of breath     DOE  . Wears glasses     driving  . Carpal tunnel syndrome   . Hyperlipemia   . Status post gastric banding   . Snores     Past Surgical History  Procedure Laterality Date  . Tonsillectomy    . Laparoscopic gastric banding  2010  . Muscle biopsy  2004    rt thigh to r/o myocitis  . Excision melanoma with sentinel lymph node biopsy  1984    rt neck with mulpipal nodes and some muscle excised rt neck-shoulder  . Total knee arthroplasty  2012    bilateral rt and lt    History reviewed. No pertinent family history. Social History:  reports that he quit smoking about 11 years ago. He does not have any smokeless tobacco history on file. He reports that  drinks alcohol. He reports that he does not use illicit drugs.  Allergies:  Allergies  Allergen Reactions  . Codeine Itching  . Dilaudid (Hydromorphone Hcl) Itching  . Penicillins Itching    Medications Prior to Admission  Medication Sig Dispense Refill  . calcium-vitamin D (OSCAL WITH D) 500-200 MG-UNIT per tablet Take 1 tablet by mouth daily.      . celecoxib (CELEBREX) 200 MG capsule Take 200 mg by mouth every evening.      . DULoxetine (CYMBALTA) 60 MG capsule Take 60 mg by mouth at bedtime.      Marland Kitchen ezetimibe-simvastatin  (VYTORIN) 10-20 MG per tablet Take 1 tablet by mouth at bedtime.      . hydrochlorothiazide (HYDRODIURIL) 25 MG tablet Take 25 mg by mouth daily.      . Multiple Vitamins-Minerals (MULTIVITAMIN WITH MINERALS) tablet Take 1 tablet by mouth daily. Takes 2      . rOPINIRole (REQUIP) 4 MG tablet Take 4 mg by mouth at bedtime.      Marland Kitchen zolpidem (AMBIEN) 10 MG tablet Take 10 mg by mouth at bedtime as needed for sleep.        Results for orders placed during the hospital encounter of 09/26/12 (from the past 48 hour(s))  BASIC METABOLIC PANEL     Status: Abnormal   Collection Time    09/25/12  2:30 PM      Result Value Range   Sodium 135  135 - 145 mEq/L   Potassium 4.3  3.5 - 5.1 mEq/L   Chloride 101  96 - 112 mEq/L   CO2 25  19 - 32 mEq/L   Glucose, Bld 115 (*) 70 - 99 mg/dL   BUN 17  6 - 23 mg/dL   Creatinine, Ser 1.61  0.50 - 1.35 mg/dL   Calcium 9.5  8.4 - 10.5 mg/dL   GFR calc non Af Amer 88 (*) >90 mL/min   GFR calc Af Amer >90  >90 mL/min   Comment:            The eGFR has been calculated     using the CKD EPI equation.     This calculation has not been     validated in all clinical     situations.     eGFR's persistently     <90 mL/min signify     possible Chronic Kidney Disease.  POCT HEMOGLOBIN-HEMACUE     Status: None   Collection Time    09/26/12  7:49 AM      Result Value Range   Hemoglobin 16.3  13.0 - 17.0 g/dL    No results found.   A comprehensive review of systems was negative except for: Eyes: positive for contacts/glasses Genitourinary: positive for dysuria Behavioral/Psych: positive for depression  Blood pressure 137/99, pulse 73, temperature 98 F (36.7 C), temperature source Oral, resp. rate 20, height 5\' 8"  (1.727 m), weight 111.857 kg (246 lb 9.6 oz), SpO2 96.00%.  General appearance: alert, cooperative and appears stated age Head: Normocephalic, without obvious abnormality, atraumatic Neck: supple, symmetrical, trachea midline Resp: clear to  auscultation bilaterally Cardio: regular rate and rhythm GI: non tender Extremities: intact sensation and capillary refill all digits.  +epl/fpl/io.  no wounds Pulses: 2+ and symmetric Skin: Skin color, texture, turgor normal. No rashes or lesions Neurologic: Grossly normal Incision/Wound: na  Assessment/Plan Right carpal tunnel syndrome and ring and small finger trigger digits.  Non operative and operative treatment options were discussed with the patient and patient wishes to proceed with operative treatment. Risks, benefits, and alternatives of surgery were discussed and the patient agrees with the plan of care.   David [name] R 09/26/2012, 8:24 AM

## 2012-09-26 NOTE — Transfer of Care (Signed)
Immediate Anesthesia Transfer of Care Note  Patient: David [name]  Procedure(s) Performed: Procedure(s): CARPAL TUNNEL RELEASE (Right) RELEASE TRIGGER FINGER/A-1 PULLEY RING AND SMALL  (Right)  Patient Location: PACU  Anesthesia Type:MAC and Bier block  Level of Consciousness: awake, alert  and oriented  Airway & Oxygen Therapy: Patient Spontanous Breathing and Patient connected to face mask oxygen  Post-op Assessment: Report given to PACU RN and Post -op Vital signs reviewed and stable  Post vital signs: Reviewed and stable  Complications: No apparent anesthesia complications

## 2012-09-27 ENCOUNTER — Encounter (HOSPITAL_BASED_OUTPATIENT_CLINIC_OR_DEPARTMENT_OTHER): Payer: Self-pay | Admitting: Orthopedic Surgery

## 2012-09-27 NOTE — Addendum Note (Signed)
Addendum created 09/27/12 1015 by Lance Coon, CRNA   Modules edited: Charges VN

## 2012-09-27 NOTE — Op Note (Signed)
NAMEMICHEL, HENDON NO.:  [id number]  MEDICAL RECORD NO.:  [id number]  LOCATION:                                 FACILITY:  PHYSICIAN:  Betha Loa, MD             DATE OF BIRTH:  DATE OF PROCEDURE:  09/26/2012 DATE OF DISCHARGE:                              OPERATIVE REPORT   PREOPERATIVE DIAGNOSIS:  Right carpal tunnel syndrome, ring and small finger trigger digits.  POSTOPERATIVE DIAGNOSIS:  Right carpal tunnel syndrome, ring and small finger trigger digits.  PROCEDURE:   1. Right carpal tunnel release  2. Right ring finger trigger digit release 3. Right small finger trigger digits release  SURGEONS:  Betha Loa, MD  ASSISTANT:  None.  ANESTHESIA:  Bier block anesthesia.  IV FLUIDS:  Per anesthesia flow sheet.  ESTIMATED BLOOD LOSS:  Minimal.  COMPLICATIONS:  None.  SPECIMENS:  None.  TOURNIQUET TIME:  56 minutes.  DISPOSITION:  Stable to PACU.  INDICATIONS:  Mr. Degante is a 62 year old right-hand dominant male who has been followed in my office.  He has positive nerve conduction studies for carpal tunnel syndrome.  He has had triggering of the right ring and small fingers.  He has had these each injected twice with not lasting relief.  He wishes to have a release of these triggers in the carpal tunnel for management of this symptoms.  Risks, benefits, and alternatives of the surgery were discussed including risk of blood loss, infection, damage to nerves, vessels, tendons, ligaments, bone; failure to surgery, need for additional surgery, complications with wound healing, continued pain, continued triggering, and continued carpal tunnel syndrome.  He voiced understanding of these risks and elected to proceed.  OPERATIVE COURSE:  After being identified preoperatively by myself, the patient and I agreed upon procedure and site of procedure.  Surgical site was marked.  The risks, benefits, and alternatives of surgery were reviewed and  he wished to proceed.  Surgical consent had been signed. He was given 2 g of IV Ancef as preoperative antibiotic prophylaxis.  He was transported to the operating room and placed on the operative table in a supine position with the right upper extremity on arm board.  Bier block anesthesia was induced by the anesthesiologist.  The right upper extremity was prepped and draped in normal sterile orthopedic fashion. A surgical pause was performed between surgeons, anesthesia, and operating room staff, and all were in agreement as to the patient, procedure, and site of procedure.  The tourniquet at the proximal aspect of the forearm had been inflated for the Bier block.  Incision was made over the transverse carpal ligament.  It was carried into subcutaneous tissues by spreading technique.  Bipolar electrocautery was used to obtain hemostasis.  The transverse carpal ligament was incised sharply. It was incised distally.  Care was taken to ensure complete decompression proximally.  The scissors were used to split the distal aspect of the volar antebrachial fascia.  The nerve was hyperemic.  It was examined.  The motor branch was identified and was intact. Attention was turned to the trigger digits.  The ring finger was addressed  first.  An incision was made over the MP joint.  It was carried into subcutaneous tissues by spreading technique.  The A1 pulley was identified and incised.  The first 1-2 mm of the A2 pulley were incised to prevent any catching at the A2 pulley.  The small finger was then addressed.  Again, an incision was made at the MP joint.  This had to be extended slightly proximally due to a small retinacular ganglion. It was hard to visualize.  The A1 pulley was then identified and incised sharply.  The first 1-2 mm of the A2 pulley was incised as well.  The small ganglion was removed.  The tendons were brought out through the wounds.  There was some adhesion of the FDS and FDP  in the small finger, especially.  This was separated.  The wounds were all copiously irrigated with sterile saline.  They were closed with 4-0 nylon in a horizontal mattress fashion.  They were then injected with 10 mL of 0.25% plain Marcaine to aid in postoperative analgesia.  The wounds were dressed with sterile Xeroform, 4x4s, and ABD and wrapped with Kerlix and Ace bandage.  The tourniquet was deflated at 56 minutes.  The fingertips were pink with brisk capillary refill after deflation of the tourniquet. Operative drapes were broken down.  The patient was transferred back to the stretcher and taken to PACU in stable condition.  I will see him back in the office in 1 week for postoperative followup.  I will give him Percocet 5/325, 1-2 p.o. q.6 hours p.r.n. pain, dispensed #40.     Betha Loa, MD     KK/MEDQ  D:  09/26/2012  T:  09/27/2012  Job:  [id number]

## 2012-10-11 ENCOUNTER — Other Ambulatory Visit: Payer: Self-pay | Admitting: Orthopedic Surgery

## 2012-10-21 ENCOUNTER — Encounter (HOSPITAL_BASED_OUTPATIENT_CLINIC_OR_DEPARTMENT_OTHER): Payer: Self-pay | Admitting: *Deleted

## 2012-10-22 ENCOUNTER — Encounter (HOSPITAL_BASED_OUTPATIENT_CLINIC_OR_DEPARTMENT_OTHER): Payer: Self-pay | Admitting: *Deleted

## 2012-10-23 NOTE — Progress Notes (Signed)
Spoke with Dr.Ossey if need to repeat BMET since pt just had one 09/25/2012 - stated pt does not need another one.

## 2012-10-24 ENCOUNTER — Ambulatory Visit (HOSPITAL_BASED_OUTPATIENT_CLINIC_OR_DEPARTMENT_OTHER)
Admission: RE | Admit: 2012-10-24 | Discharge: 2012-10-24 | Disposition: A | Discharge status: Home / Self Care | Payer: BC Managed Care – PPO | Source: Ambulatory Visit | Attending: Orthopedic Surgery | Admitting: Orthopedic Surgery

## 2012-10-24 ENCOUNTER — Encounter (HOSPITAL_BASED_OUTPATIENT_CLINIC_OR_DEPARTMENT_OTHER)
Admission: RE | Disposition: A | Discharge status: Home / Self Care | Payer: Self-pay | Source: Ambulatory Visit | Attending: Orthopedic Surgery

## 2012-10-24 ENCOUNTER — Encounter (HOSPITAL_BASED_OUTPATIENT_CLINIC_OR_DEPARTMENT_OTHER): Payer: Self-pay | Admitting: *Deleted

## 2012-10-24 ENCOUNTER — Ambulatory Visit (HOSPITAL_BASED_OUTPATIENT_CLINIC_OR_DEPARTMENT_OTHER): Payer: BC Managed Care – PPO | Admitting: *Deleted

## 2012-10-24 DIAGNOSIS — F3289 Other specified depressive episodes: Secondary | ICD-10-CM | POA: Insufficient documentation

## 2012-10-24 DIAGNOSIS — M6530 Trigger finger, unspecified finger: Secondary | ICD-10-CM | POA: Insufficient documentation

## 2012-10-24 DIAGNOSIS — F329 Major depressive disorder, single episode, unspecified: Secondary | ICD-10-CM | POA: Insufficient documentation

## 2012-10-24 DIAGNOSIS — M129 Arthropathy, unspecified: Secondary | ICD-10-CM | POA: Insufficient documentation

## 2012-10-24 DIAGNOSIS — G5600 Carpal tunnel syndrome, unspecified upper limb: Secondary | ICD-10-CM | POA: Insufficient documentation

## 2012-10-24 DIAGNOSIS — I10 Essential (primary) hypertension: Secondary | ICD-10-CM | POA: Insufficient documentation

## 2012-10-24 DIAGNOSIS — F411 Generalized anxiety disorder: Secondary | ICD-10-CM | POA: Insufficient documentation

## 2012-10-24 DIAGNOSIS — G4730 Sleep apnea, unspecified: Secondary | ICD-10-CM | POA: Insufficient documentation

## 2012-10-24 DIAGNOSIS — E785 Hyperlipidemia, unspecified: Secondary | ICD-10-CM | POA: Insufficient documentation

## 2012-10-24 DIAGNOSIS — Z79899 Other long term (current) drug therapy: Secondary | ICD-10-CM | POA: Insufficient documentation

## 2012-10-24 DIAGNOSIS — G43809 Other migraine, not intractable, without status migrainosus: Secondary | ICD-10-CM | POA: Insufficient documentation

## 2012-10-24 DIAGNOSIS — K219 Gastro-esophageal reflux disease without esophagitis: Secondary | ICD-10-CM | POA: Insufficient documentation

## 2012-10-24 DIAGNOSIS — Z880 Allergy status to penicillin: Secondary | ICD-10-CM | POA: Insufficient documentation

## 2012-10-24 DIAGNOSIS — G2581 Restless legs syndrome: Secondary | ICD-10-CM | POA: Insufficient documentation

## 2012-10-24 DIAGNOSIS — Z888 Allergy status to other drugs, medicaments and biological substances status: Secondary | ICD-10-CM | POA: Insufficient documentation

## 2012-10-24 DIAGNOSIS — Z9884 Bariatric surgery status: Secondary | ICD-10-CM | POA: Insufficient documentation

## 2012-10-24 DIAGNOSIS — Z885 Allergy status to narcotic agent status: Secondary | ICD-10-CM | POA: Insufficient documentation

## 2012-10-24 HISTORY — PX: TRIGGER FINGER RELEASE: SHX641

## 2012-10-24 HISTORY — PX: CARPAL TUNNEL RELEASE: SHX101

## 2012-10-24 HISTORY — DX: Headache: R51

## 2012-10-24 LAB — POCT HEMOGLOBIN-HEMACUE: Hemoglobin: 16.8 g/dL (ref 13.0–17.0)

## 2012-10-24 SURGERY — CARPAL TUNNEL RELEASE
Anesthesia: Regional | Site: Wrist | Laterality: Left | Wound class: Clean

## 2012-10-24 MED ORDER — FENTANYL CITRATE 0.05 MG/ML IJ SOLN
INTRAMUSCULAR | Status: DC | PRN
  Administered 2012-10-24: 50 ug via INTRAVENOUS

## 2012-10-24 MED ORDER — LIDOCAINE HCL (PF) 0.5 % IJ SOLN
INTRAMUSCULAR | Status: DC | PRN
  Administered 2012-10-24: 35 mL via INTRAVENOUS

## 2012-10-24 MED ORDER — CEFAZOLIN SODIUM-DEXTROSE 2-3 GM-% IV SOLR
2.0000 g | Freq: Once | INTRAVENOUS | Status: AC
  Administered 2012-10-24: 2 g via INTRAVENOUS

## 2012-10-24 MED ORDER — LACTATED RINGERS IV SOLN: INTRAVENOUS | Status: DC

## 2012-10-24 MED ORDER — MIDAZOLAM HCL 2 MG/2ML IJ SOLN: 1.0000 mg | INTRAMUSCULAR | Status: DC | PRN

## 2012-10-24 MED ORDER — MIDAZOLAM HCL 5 MG/5ML IJ SOLN
INTRAMUSCULAR | Status: DC | PRN
  Administered 2012-10-24: 2 mg via INTRAVENOUS

## 2012-10-24 MED ORDER — ONDANSETRON HCL 4 MG/2ML IJ SOLN
INTRAMUSCULAR | Status: DC | PRN
  Administered 2012-10-24: 4 mg via INTRAVENOUS

## 2012-10-24 MED ORDER — OXYCODONE-ACETAMINOPHEN 5-325 MG PO TABS: ORAL_TABLET | ORAL | Status: DC

## 2012-10-24 MED ORDER — FENTANYL CITRATE 0.05 MG/ML IJ SOLN: 50.0000 ug | INTRAMUSCULAR | Status: DC | PRN

## 2012-10-24 MED ORDER — VANCOMYCIN HCL IN DEXTROSE 1-5 GM/200ML-% IV SOLN: 1000.0000 mg | INTRAVENOUS | Status: DC

## 2012-10-24 MED ORDER — LACTATED RINGERS IV SOLN
INTRAVENOUS | Status: DC | PRN
  Administered 2012-10-24: 08:00:00 via INTRAVENOUS

## 2012-10-24 MED ORDER — BUPIVACAINE HCL (PF) 0.25 % IJ SOLN
INTRAMUSCULAR | Status: DC | PRN
  Administered 2012-10-24: 18 mL

## 2012-10-24 MED ORDER — PROPOFOL INFUSION 10 MG/ML OPTIME
INTRAVENOUS | Status: DC | PRN
  Administered 2012-10-24: 100 ug/kg/min via INTRAVENOUS

## 2012-10-24 MED ORDER — LIDOCAINE HCL (CARDIAC) 20 MG/ML IV SOLN
INTRAVENOUS | Status: DC | PRN
  Administered 2012-10-24: 25 mg via INTRAVENOUS

## 2012-10-24 MED ORDER — CHLORHEXIDINE GLUCONATE 4 % EX LIQD: 60.0000 mL | Freq: Once | CUTANEOUS | Status: DC

## 2012-10-24 MED ORDER — FENTANYL CITRATE 0.05 MG/ML IJ SOLN: 25.0000 ug | INTRAMUSCULAR | Status: DC | PRN

## 2012-10-24 SURGICAL SUPPLY — 38 items
BANDAGE COBAN STERILE 2 (GAUZE/BANDAGES/DRESSINGS) ×3 IMPLANT
BANDAGE CONFORM 2  STR LF (GAUZE/BANDAGES/DRESSINGS) ×3 IMPLANT
BANDAGE ELASTIC 3 VELCRO ST LF (GAUZE/BANDAGES/DRESSINGS) ×3 IMPLANT
BANDAGE GAUZE ELAST BULKY 4 IN (GAUZE/BANDAGES/DRESSINGS) ×3 IMPLANT
BLADE MINI RND TIP GREEN BEAV (BLADE) IMPLANT
BLADE SURG 15 STRL LF DISP TIS (BLADE) ×4 IMPLANT
BLADE SURG 15 STRL SS (BLADE) ×2
BNDG ELASTIC 2 VLCR STRL LF (GAUZE/BANDAGES/DRESSINGS) IMPLANT
BNDG ESMARK 4X9 LF (GAUZE/BANDAGES/DRESSINGS) IMPLANT
CHLORAPREP W/TINT 26ML (MISCELLANEOUS) ×3 IMPLANT
CLOTH BEACON ORANGE TIMEOUT ST (SAFETY) ×3 IMPLANT
CORDS BIPOLAR (ELECTRODE) ×3 IMPLANT
COVER MAYO STAND STRL (DRAPES) ×3 IMPLANT
COVER TABLE BACK 60X90 (DRAPES) ×3 IMPLANT
CUFF TOURNIQUET SINGLE 18IN (TOURNIQUET CUFF) ×3 IMPLANT
DRAPE EXTREMITY T 121X128X90 (DRAPE) ×3 IMPLANT
DRAPE SURG 17X23 STRL (DRAPES) ×3 IMPLANT
DRSG PAD ABDOMINAL 8X10 ST (GAUZE/BANDAGES/DRESSINGS) ×3 IMPLANT
GAUZE XEROFORM 1X8 LF (GAUZE/BANDAGES/DRESSINGS) ×3 IMPLANT
GLOVE BIO SURGEON STRL SZ 6.5 (GLOVE) ×3 IMPLANT
GLOVE BIO SURGEON STRL SZ7.5 (GLOVE) ×3 IMPLANT
GLOVE BIOGEL PI IND STRL 7.0 (GLOVE) ×2 IMPLANT
GLOVE BIOGEL PI INDICATOR 7.0 (GLOVE) ×1
GLOVE EXAM NITRILE LRG STRL (GLOVE) ×3 IMPLANT
GOWN BRE IMP PREV XXLGXLNG (GOWN DISPOSABLE) ×3 IMPLANT
GOWN PREVENTION PLUS XLARGE (GOWN DISPOSABLE) ×3 IMPLANT
NEEDLE HYPO 25X1 1.5 SAFETY (NEEDLE) IMPLANT
NS IRRIG 1000ML POUR BTL (IV SOLUTION) ×3 IMPLANT
PACK BASIN DAY SURGERY FS (CUSTOM PROCEDURE TRAY) ×3 IMPLANT
PADDING CAST ABS 4INX4YD NS (CAST SUPPLIES) ×1
PADDING CAST ABS COTTON 4X4 ST (CAST SUPPLIES) ×2 IMPLANT
SPONGE GAUZE 4X4 12PLY (GAUZE/BANDAGES/DRESSINGS) ×3 IMPLANT
STOCKINETTE 4X48 STRL (DRAPES) ×3 IMPLANT
SUT ETHILON 4 0 PS 2 18 (SUTURE) ×3 IMPLANT
SYR BULB 3OZ (MISCELLANEOUS) ×3 IMPLANT
SYR CONTROL 10ML LL (SYRINGE) IMPLANT
TOWEL OR 17X24 6PK STRL BLUE (TOWEL DISPOSABLE) ×6 IMPLANT
UNDERPAD 30X30 INCONTINENT (UNDERPADS AND DIAPERS) ×3 IMPLANT

## 2012-10-24 NOTE — Anesthesia Postprocedure Evaluation (Signed)
  Anesthesia Post-op Note  Patient: David [name]  Procedure(s) Performed: Procedure(s): CARPAL TUNNEL RELEASE (Left) RELEASE TRIGGER FINGER/A-1 PULLEY LEFT MIDDLE AND LEFT RING (Left)  Patient Location: PACU  Anesthesia Type:General  Level of Consciousness: awake  Airway and Oxygen Therapy: Patient Spontanous Breathing  Post-op Pain: mild  Post-op Assessment: Post-op Vital signs reviewed  Post-op Vital Signs: Reviewed  Complications: No apparent anesthesia complications

## 2012-10-24 NOTE — Anesthesia Preprocedure Evaluation (Addendum)
Anesthesia Evaluation  Patient identified by MRN, date of birth, ID band Patient awake    Reviewed: Allergy & Precautions, H&P , NPO status , Patient's Chart, lab work & pertinent test results  Airway Mallampati: II      Dental   Pulmonary shortness of breath and with exertion, sleep apnea ,  breath sounds clear to auscultation        Cardiovascular hypertension, Rhythm:Regular Rate:Normal     Neuro/Psych  Headaches, Anxiety Depression    GI/Hepatic Neg liver ROS, GERD-  ,  Endo/Other  negative endocrine ROS  Renal/GU negative Renal ROS     Musculoskeletal   Abdominal   Peds  Hematology   Anesthesia Other Findings   Reproductive/Obstetrics                           Anesthesia Physical Anesthesia Plan  ASA: III  Anesthesia Plan: Bier Block   Post-op Pain Management:    Induction: Intravenous  Airway Management Planned: Mask  Additional Equipment:   Intra-op Plan:   Post-operative Plan:   Informed Consent: I have reviewed the patients History and Physical, chart, labs and discussed the procedure including the risks, benefits and alternatives for the proposed anesthesia with the patient or authorized representative who has indicated his/her understanding and acceptance.   Dental advisory given  Plan Discussed with: CRNA, Anesthesiologist and Surgeon  Anesthesia Plan Comments:         Anesthesia Quick Evaluation

## 2012-10-24 NOTE — H&P (Signed)
David [name] is an 62 y.o. male.   Chief Complaint: left carpal tunnel and long/ring trigger digits HPI: 62 yo male with months of triggering of left long and ring fingers and carpal tunnel syndrome.  Has had injections x 2 in trigger digits without resolution of symptoms.  Positive nerve conduction studies.  Previous right carpal tunnel and trigger digit release.  He wishes to have left carpal tunnel and trigger digit release.  Past Medical History  Diagnosis Date  . Dysrhythmia     had some tachycardia with steroids  . Hypertension   . Depression   . RLS (restless legs syndrome)   . Arthritis   . GERD (gastroesophageal reflux disease)     no meds since gastric banding  . Sleep apnea     could not use a cpap  . Shortness of breath     DOE  . Wears glasses     driving  . Carpal tunnel syndrome   . Hyperlipemia   . Status post gastric banding   . Snores   . Headache(784.0)     Ocular Migraines - takes Ambien for it    Past Surgical History  Procedure Laterality Date  . Tonsillectomy    . Laparoscopic gastric banding  03/15/2009  . Muscle biopsy  2004    rt thigh to r/o myocitis  . Excision melanoma with sentinel lymph node biopsy  1984    rt neck with mulpipal nodes and some muscle excised rt neck-shoulder  . Total knee arthroplasty Bilateral 12/05/2010  . Carpal tunnel release Right 09/26/2012    Procedure: CARPAL TUNNEL RELEASE;  Surgeon: Tami Ribas, MD;  Location: [facility];  Service: Orthopedics;  Laterality: Right;  . Trigger finger release Right 09/26/2012    Procedure: RELEASE TRIGGER FINGER/A-1 PULLEY RING AND SMALL ;  Surgeon: Tami Ribas, MD;  Location: [facility];  Service: Orthopedics;  Laterality: Right;    History reviewed. No pertinent family history. Social History:  reports that he quit smoking about 11 years ago. He does not have any smokeless tobacco history on file. He reports that  drinks alcohol. He reports that he  does not use illicit drugs.  Allergies:  Allergies  Allergen Reactions  . Codeine Itching  . Dilaudid (Hydromorphone Hcl) Itching  . Fentanyl Itching    Pt states Fentanyl patch causes itching  . Penicillins Itching    Medications Prior to Admission  Medication Sig Dispense Refill  . calcium-vitamin D (OSCAL WITH D) 500-200 MG-UNIT per tablet Take 1 tablet by mouth daily.      . celecoxib (CELEBREX) 200 MG capsule Take 200 mg by mouth every evening.      . DULoxetine (CYMBALTA) 60 MG capsule Take 60 mg by mouth at bedtime.      Marland Kitchen ezetimibe-simvastatin (VYTORIN) 10-20 MG per tablet Take 1 tablet by mouth at bedtime.      . hydrochlorothiazide (HYDRODIURIL) 25 MG tablet Take 25 mg by mouth daily.      . Multiple Vitamins-Minerals (MULTIVITAMIN WITH MINERALS) tablet Take 1 tablet by mouth daily. Takes 2      . oxyCODONE-acetaminophen (PERCOCET) 5-325 MG per tablet 1-2 tabs po q6 hours prn pain  40 tablet  0  . rOPINIRole (REQUIP) 4 MG tablet Take 4 mg by mouth at bedtime.      Marland Kitchen zolpidem (AMBIEN) 10 MG tablet Take 10 mg by mouth at bedtime as needed for sleep.  Results for orders placed during the hospital encounter of 10/24/12 (from the past 48 hour(s))  POCT HEMOGLOBIN-HEMACUE     Status: None   Collection Time    10/24/12  8:03 AM      Result Value Range   Hemoglobin 16.8  13.0 - 17.0 g/dL    No results found.   A comprehensive review of systems was negative except for: Eyes: positive for contacts/glasses Genitourinary: positive for dysuria Behavioral/Psych: positive for depression  Blood pressure 136/91, pulse 73, temperature 98.4 F (36.9 C), temperature source Oral, resp. rate 20, height 5\' 8"  (1.727 m), weight 112.946 kg (249 lb), SpO2 96.00%.  General appearance: alert, cooperative and appears stated age Head: Normocephalic, without obvious abnormality, atraumatic Neck: supple, symmetrical, trachea midline Resp: clear to auscultation bilaterally Cardio:  regular rate and rhythm GI: non tender Extremities: intact sensation and capillary refill all digits.  +epl/fpl/io  skin intact Pulses: 2+ and symmetric Skin: Skin color, texture, turgor normal. No rashes or lesions Neurologic: Grossly normal Incision/Wound: na  Assessment/Plan Left carpal tunnel and long and ring finger trigger digits.  Non operative and operative treatment options were discussed with the patient and patient wishes to proceed with operative treatment. Risks, benefits, and alternatives of surgery were discussed and the patient agrees with the plan of care.   Maverik Foot R 10/24/2012, 8:32 AM

## 2012-10-24 NOTE — Anesthesia Postprocedure Evaluation (Signed)
  Anesthesia Post-op Note  Patient: David [name]  Procedure(s) Performed: Procedure(s): CARPAL TUNNEL RELEASE (Left) RELEASE TRIGGER FINGER/A-1 PULLEY LEFT MIDDLE AND LEFT RING (Left)  Patient Location: PACU  Anesthesia Type:General  Level of Consciousness: awake  Airway and Oxygen Therapy: Patient Spontanous Breathing  Post-op Pain: mild  Post-op Assessment: Post-op Vital signs reviewed  Post-op Vital Signs: Reviewed  Complications: No apparent anesthesia complications 

## 2012-10-24 NOTE — Transfer of Care (Signed)
Immediate Anesthesia Transfer of Care Note  Patient: David [name]  Procedure(s) Performed: Procedure(s): CARPAL TUNNEL RELEASE (Left) RELEASE TRIGGER FINGER/A-1 PULLEY LEFT MIDDLE AND LEFT RING (Left)  Patient Location: PACU  Anesthesia Type:Bier block  Level of Consciousness: awake, alert  and oriented  Airway & Oxygen Therapy: Patient Spontanous Breathing and Patient connected to face mask oxygen  Post-op Assessment: Report given to PACU RN, Post -op Vital signs reviewed and stable and Patient moving all extremities  Post vital signs: Reviewed and stable  Complications: No apparent anesthesia complications

## 2012-10-24 NOTE — Anesthesia Procedure Notes (Signed)
Procedure Name: MAC Date/Time: 10/24/2012 8:48 AM Performed by: Meyer Russel Pre-anesthesia Checklist: Patient identified, Emergency Drugs available, Suction available and Patient being monitored Patient Re-evaluated:Patient Re-evaluated prior to inductionOxygen Delivery Method: Simple face mask Preoxygenation: Pre-oxygenation with 100% oxygen Intubation Type: IV induction Ventilation: Mask ventilation without difficulty Dental Injury: Teeth and Oropharynx as per pre-operative assessment

## 2012-10-24 NOTE — Op Note (Signed)
[id number] 

## 2012-10-24 NOTE — Brief Op Note (Signed)
10/24/2012  10:01 AM  PATIENT:  Virgia Land  62 y.o. male  PRE-OPERATIVE DIAGNOSIS:  LEFT CARPAL TUNNEL SYNDROME, long/ring finger trigger digits  POST-OPERATIVE DIAGNOSIS:  LEFT CARPAL TUNNELSYNDROME, long/ring finger trigger digits   PROCEDURE:  Procedure(s): CARPAL TUNNEL RELEASE (Left) RELEASE TRIGGER FINGER/A-1 PULLEY LEFT MIDDLE AND LEFT RING (Left)  SURGEON:  Surgeon(s) and Role:    * Tami Ribas, MD - Primary  PHYSICIAN ASSISTANT:   ASSISTANTS: none   ANESTHESIA:   Bier block with sedation   EBL:  Total I/O In: 800 [I.V.:800] Out: -   BLOOD ADMINISTERED:none  DRAINS: none   LOCAL MEDICATIONS USED:  MARCAINE     SPECIMEN:  No Specimen  DISPOSITION OF SPECIMEN:  N/A  COUNTS:  YES  TOURNIQUET:   Total Tourniquet Time Documented: Forearm (Left) - 56 minutes Total: Forearm (Left) - 56 minutes   DICTATION: .Other Dictation: Dictation Number [id number]  PLAN OF CARE: Discharge to home after PACU  PATIENT DISPOSITION:  PACU - hemodynamically stable.

## 2012-10-25 ENCOUNTER — Encounter (HOSPITAL_BASED_OUTPATIENT_CLINIC_OR_DEPARTMENT_OTHER): Payer: Self-pay | Admitting: Orthopedic Surgery

## 2012-10-25 NOTE — Op Note (Signed)
NAMEABRIEL, GEESEY NO.:  [id number]  MEDICAL RECORD NO.:  [id number]  LOCATION:                                 FACILITY:  PHYSICIAN:  Betha Loa, MD             DATE OF BIRTH:  DATE OF PROCEDURE:  10/24/2012 DATE OF DISCHARGE:                              OPERATIVE REPORT   PREOPERATIVE DIAGNOSIS:  Left carpal tunnel syndrome, and long and ring finger trigger digits.  POSTOPERATIVE DIAGNOSIS:  Left carpal tunnel syndrome, and long and ring finger trigger digits.  PROCEDURE:   1. Left carpal tunnel release 2. Left long finger trigger digit release 3. Left ring finger trigger digit release  SURGEON:  Betha Loa, MD  ASSISTANT:  None.  ANESTHESIA:  Bier block with sedation.  IV FLUIDS:  Per anesthesia flow sheet.  ESTIMATED BLOOD LOSS:  Minimal.  COMPLICATIONS:  None.  SPECIMENS:  None.  TOURNIQUET TIME:  56 minutes.  DISPOSITION:  Stable to PACU.  INDICATIONS:  [name] is a 62 year old male, who had carpal tunnel syndrome and triggering of the left long and ring fingers for approximately 6 months.  He has had 2 injections in the trigger digits without lasting relief.  He wishes to have release of the carpal tunnel and trigger digits.  He has positive nerve conduction studies.  Risks, benefits, and alternatives surgery were discussed including risk of blood loss, infection, damage to nerves, vessels, tendons, ligaments, bone; failure of surgery; need for additional surgery, complications with wound healing, continued pain, nonunion, malunion, stiffness.  He voiced understanding of these risks and elected to proceed.  OPERATIVE COURSE:  After being identified preoperatively by myself, the patient and I agreed upon the procedure and site of procedure.  Surgical site was marked.  The risks, benefits, and alternatives of surgery were reviewed and he wished to proceed.  Surgical consent had been signed. He was given 2 g of IV Ancef as  preoperative antibiotic prophylaxis.  He was transferred to the operating room and placed on the operative table in supine position with left upper extremity on arm board.  Bier block anesthesia was induced by the anesthesiologist.  The left upper extremity was prepped and draped in normal sterile orthopedic fashion. Surgical pause was performed between surgeons, anesthesia, operating staff, and all were in agreement as to the patient, procedure, and site of procedure.  Tourniquet at the proximal aspect of the extremity had been inflated for the Bier block.  An incision was made over the transverse carpal ligament and carried into subcutaneous tissues by spreading technique.  The palmar fascia was incised sharply.  Bipolar electrocautery was used to obtain hemostasis.  The transverse carpal ligament was incised on its distal direction.  Care was taken to ensure complete decompression distally.  It was then incised proximally.  The distal aspect of the volar antebrachial fascia was split using the scissors.  The finger was placed into the wound to ensure complete decompression, which was the case.  The nerve was flattened.  It was explored.  The motor branch was difficult to visualize due to fat underneath the distal aspect of the transverse  carpal ligament.  It was able to be visualized one time.  Attention was turned to the long finger.  An incision was made at the volar to the MP joint.  It was carried into subcutaneous tissues by spreading technique.  The A1 pulley was incised.  The first 1-2 mm of the A2 pulley was incised.  The tendons were brought out through the wound and there was no remaining triggering.  Attention was turned to the ring finger.  Again, incision was made and spreading technique used in the subcutaneous tissues.  The A1 pulley was incised sharply.  The first 1-2 mm of the A2 pulley was incised as well.  The finger was placed through a range of motion. There was no  triggering remaining.  The wounds were all copiously irrigated with sterile saline.  They were closed with 4-0 nylon in a horizontal mattress fashion.  They were injected with 0.25% plain Marcaine to aid in postoperative analgesia.  The carpal tunnel wound had been injected prior to the procedure to augment the anesthesia.  The wounds were then dressed with sterile Xeroform, 4x4s, and ABD, and wrapped with a Kerlix and Ace bandage.  Tourniquet deflated at 56 minute.  Fingertips were pink with brisk capillary refill after deflation of tourniquet.  Operative drapes were broken down.  The patient was awoken from anesthesia safely.  He was transferred back to stretcher and taken to PACU in stable condition.  I will see him back in the office in 1 week for postoperative followup.  I will give him Percocet 5/325 one to two p.o. q.6 hours p.r.n. pain, dispensed #30.  He has taken this the past without problems.     Betha Loa, MD     KK/MEDQ  D:  10/24/2012  T:  10/24/2012  Job:  [id number]

## 2013-09-02 ENCOUNTER — Telehealth (HOSPITAL_COMMUNITY): Payer: Self-pay

## 2013-09-02 NOTE — Telephone Encounter (Signed)
This patient is overdue for recommended follow-up with a bariatric surgeon at [facility]. Call attempted today to reestablish post-op care with [facility], but unable to reach patient by phone.  A letter will be mailed to the patient today to the address on file from [facility] advising the patient on the benefits of follow-up care and directing them to call [facility] at [phone] to schedule an appointment at their earliest convenience.  ° °Amanda T. Fleming °Bariatric Office Coordinator °[phone] ° °

## 2013-09-15 ENCOUNTER — Other Ambulatory Visit: Payer: Self-pay | Admitting: Rheumatology

## 2013-09-15 ENCOUNTER — Ambulatory Visit
Admission: RE | Admit: 2013-09-15 | Discharge: 2013-09-15 | Disposition: A | Discharge status: Home / Self Care | Payer: BC Managed Care – PPO | Source: Ambulatory Visit | Attending: Rheumatology | Admitting: Rheumatology

## 2013-09-15 DIAGNOSIS — Z5189 Encounter for other specified aftercare: Secondary | ICD-10-CM

## 2013-09-15 DIAGNOSIS — Z0189 Encounter for other specified special examinations: Secondary | ICD-10-CM

## 2014-03-27 ENCOUNTER — Encounter: Payer: Self-pay | Admitting: Rheumatology

## 2015-06-28 ENCOUNTER — Inpatient Hospital Stay (HOSPITAL_BASED_OUTPATIENT_CLINIC_OR_DEPARTMENT_OTHER)
Admission: EM | Admit: 2015-06-28 | Discharge: 2015-07-02 | DRG: 395 | Disposition: A | Discharge status: Home / Self Care | Payer: 59 | Attending: Surgery | Admitting: Surgery

## 2015-06-28 ENCOUNTER — Encounter (HOSPITAL_BASED_OUTPATIENT_CLINIC_OR_DEPARTMENT_OTHER): Payer: Self-pay | Admitting: Emergency Medicine

## 2015-06-28 DIAGNOSIS — G4733 Obstructive sleep apnea (adult) (pediatric): Secondary | ICD-10-CM | POA: Diagnosis present

## 2015-06-28 DIAGNOSIS — G2581 Restless legs syndrome: Secondary | ICD-10-CM | POA: Diagnosis present

## 2015-06-28 DIAGNOSIS — I880 Nonspecific mesenteric lymphadenitis: Secondary | ICD-10-CM | POA: Diagnosis not present

## 2015-06-28 DIAGNOSIS — M069 Rheumatoid arthritis, unspecified: Secondary | ICD-10-CM | POA: Diagnosis present

## 2015-06-28 DIAGNOSIS — Z96653 Presence of artificial knee joint, bilateral: Secondary | ICD-10-CM | POA: Diagnosis present

## 2015-06-28 DIAGNOSIS — F329 Major depressive disorder, single episode, unspecified: Secondary | ICD-10-CM | POA: Diagnosis present

## 2015-06-28 DIAGNOSIS — R1031 Right lower quadrant pain: Secondary | ICD-10-CM | POA: Diagnosis not present

## 2015-06-28 DIAGNOSIS — I10 Essential (primary) hypertension: Secondary | ICD-10-CM | POA: Diagnosis present

## 2015-06-28 DIAGNOSIS — K8020 Calculus of gallbladder without cholecystitis without obstruction: Secondary | ICD-10-CM | POA: Diagnosis present

## 2015-06-28 DIAGNOSIS — Z87891 Personal history of nicotine dependence: Secondary | ICD-10-CM

## 2015-06-28 DIAGNOSIS — Z9884 Bariatric surgery status: Secondary | ICD-10-CM

## 2015-06-28 DIAGNOSIS — K219 Gastro-esophageal reflux disease without esophagitis: Secondary | ICD-10-CM | POA: Diagnosis present

## 2015-06-28 LAB — CBC
HCT: 40.3 % (ref 39.0–52.0)
Hemoglobin: 13.5 g/dL (ref 13.0–17.0)
MCH: 31 pg (ref 26.0–34.0)
MCHC: 33.5 g/dL (ref 30.0–36.0)
MCV: 92.6 fL (ref 78.0–100.0)
Platelets: 262 10*3/uL (ref 150–400)
RBC: 4.35 MIL/uL (ref 4.22–5.81)
RDW: 13.9 % (ref 11.5–15.5)
WBC: 10.7 10*3/uL — ABNORMAL HIGH (ref 4.0–10.5)

## 2015-06-28 LAB — COMPREHENSIVE METABOLIC PANEL
ALT: 31 U/L (ref 17–63)
AST: 35 U/L (ref 15–41)
Albumin: 4 g/dL (ref 3.5–5.0)
Alkaline Phosphatase: 68 U/L (ref 38–126)
Anion gap: 10 (ref 5–15)
BUN: 15 mg/dL (ref 6–20)
CO2: 30 mmol/L (ref 22–32)
Calcium: 9.6 mg/dL (ref 8.9–10.3)
Chloride: 97 mmol/L — ABNORMAL LOW (ref 101–111)
Creatinine, Ser: 1.02 mg/dL (ref 0.61–1.24)
GFR calc Af Amer: >60 mL/min (ref >60)
GFR calc non Af Amer: >60 mL/min (ref >60)
Glucose, Bld: 106 mg/dL — ABNORMAL HIGH (ref 65–99)
Potassium: 3.5 mmol/L (ref 3.5–5.1)
Sodium: 137 mmol/L (ref 135–145)
Total Bilirubin: 1.3 mg/dL — ABNORMAL HIGH (ref 0.3–1.2)
Total Protein: 6.7 g/dL (ref 6.5–8.1)

## 2015-06-28 LAB — URINALYSIS, ROUTINE W REFLEX MICROSCOPIC
Bilirubin Urine: NEGATIVE
Glucose, UA: NEGATIVE mg/dL
Hgb urine dipstick: NEGATIVE
Ketones, ur: NEGATIVE mg/dL
Leukocytes, UA: NEGATIVE
Nitrite: NEGATIVE
Protein, ur: 30 mg/dL — AB
Specific Gravity, Urine: 1.026 (ref 1.005–1.030)
pH: 5.5 (ref 5.0–8.0)

## 2015-06-28 LAB — URINE MICROSCOPIC-ADD ON

## 2015-06-28 LAB — LIPASE, BLOOD: Lipase: 27 U/L (ref 11–51)

## 2015-06-28 NOTE — ED Notes (Signed)
Patient has had right lower quad pain intermittently for about 2 days. Fever earlier today  - took motrin about 1 - 2 hours ago for fever of 102. The patient started to have N/v today

## 2015-06-28 NOTE — ED Provider Notes (Signed)
CSN: [id number]     Arrival date & time 06/28/15  2118 History  By signing my name below, I, David [name], attest that this documentation has been prepared under the direction and in the presence of Delora Fuel, MD. Electronically Signed: Eustaquio [name], ED Scribe. 06/29/2015. 12:12 AM.   Chief Complaint  Patient presents with  . Abdominal Pain   The history is provided by the patient. No language interpreter was used.     HPI Comments: David [name] is a 65 y.o. male who presents to the Emergency Department complaining of gradual onset, constant, worsening, dull and aching, 6/10, RLQ abdominal pain x 2 days. The pain is exacerbated with bending, twisting, and movement. He also complains of a fever of 102, intermittent chills, nausea, loss of appetite, and dry heaving. Pt took Motrin earlier today with relief from his fever. His temperature in the ED is 98.6. He mentions that last night he could not sleep due to the pain and came to the ED today in an attempt to not have another night like last night. Pt states that the pain last night was worst than it is today. Denies vomiting, diarrhea, or any other associated symptoms.   Past Medical History  Diagnosis Date  . Dysrhythmia     had some tachycardia with steroids  . Hypertension   . Depression   . RLS (restless legs syndrome)   . Arthritis   . GERD (gastroesophageal reflux disease)     no meds since gastric banding  . Sleep apnea     could not use a cpap  . Shortness of breath     DOE  . Wears glasses     driving  . Carpal tunnel syndrome   . Hyperlipemia   . Status post gastric banding   . Snores   . Headache(784.0)     Ocular Migraines - takes Ambien for it   Past Surgical History  Procedure Laterality Date  . Tonsillectomy    . Laparoscopic gastric banding  03/15/2009  . Muscle biopsy  2004    rt thigh to r/o myocitis  . Excision melanoma with sentinel lymph node biopsy  1984    rt neck with mulpipal nodes and some  muscle excised rt neck-shoulder  . Total knee arthroplasty Bilateral 12/05/2010  . Carpal tunnel release Right 09/26/2012    Procedure: CARPAL TUNNEL RELEASE;  Surgeon: Tennis Must, MD;  Location: [place];  Service: Orthopedics;  Laterality: Right;  . Trigger finger release Right 09/26/2012    Procedure: RELEASE TRIGGER FINGER/A-1 PULLEY RING AND SMALL ;  Surgeon: Tennis Must, MD;  Location: [place];  Service: Orthopedics;  Laterality: Right;  . Carpal tunnel release Left 10/24/2012    Procedure: CARPAL TUNNEL RELEASE;  Surgeon: Tennis Must, MD;  Location: [place];  Service: Orthopedics;  Laterality: Left;  . Trigger finger release Left 10/24/2012    Procedure: RELEASE TRIGGER FINGER/A-1 PULLEY LEFT MIDDLE AND LEFT RING;  Surgeon: Tennis Must, MD;  Location: [place];  Service: Orthopedics;  Laterality: Left;  . Knee replacement     History reviewed. No pertinent family history. Social History  Substance Use Topics  . Smoking status: Former Smoker    Quit date: 09/23/2001  . Smokeless tobacco: None  . Alcohol Use: Yes     Comment: rare    Review of Systems  Constitutional: Positive for fever, chills and appetite change.  Gastrointestinal: Positive  for nausea and abdominal pain. Negative for vomiting and diarrhea.  All other systems reviewed and are negative.  Allergies  Codeine; Dilaudid; Fentanyl; Nucynta; and Penicillins  Home Medications   Prior to Admission medications   Medication Sig Start Date End Date Taking? Authorizing Provider  HYDROcodone-acetaminophen (NORCO) 10-325 MG tablet Take 1 tablet by mouth every 6 (six) hours as needed for moderate pain.   Yes Historical Provider, MD  Hydroxychloroquine Sulfate (PLAQUENIL PO) Take by mouth.   Yes Historical Provider, MD  Methotrexate, PF, 25 MG/0.5ML SOAJ Inject 25 mg into the skin.   Yes Historical Provider, MD  morphine (MS CONTIN) 15 MG 12 hr tablet  Take 15 mg by mouth every 12 (twelve) hours.   Yes Historical Provider, MD  sulfaDIAZINE 500 MG tablet Take 400 mg by mouth 2 (two) times daily.   Yes Historical Provider, MD  calcium-vitamin D (OSCAL WITH D) 500-200 MG-UNIT per tablet Take 1 tablet by mouth daily.    Historical Provider, MD  celecoxib (CELEBREX) 200 MG capsule Take 200 mg by mouth every evening.    Historical Provider, MD  DULoxetine (CYMBALTA) 60 MG capsule Take 60 mg by mouth at bedtime.    Historical Provider, MD  ezetimibe-simvastatin (VYTORIN) 10-20 MG per tablet Take 1 tablet by mouth at bedtime.    Historical Provider, MD  gabapentin (NEURONTIN) 100 MG capsule Take 100 mg by mouth 3 (three) times daily.    Historical Provider, MD  hydrochlorothiazide (HYDRODIURIL) 25 MG tablet Take 25 mg by mouth daily.    Historical Provider, MD  Multiple Vitamins-Minerals (MULTIVITAMIN WITH MINERALS) tablet Take 1 tablet by mouth daily. Takes 2    Historical Provider, MD  oxyCODONE-acetaminophen (PERCOCET) 5-325 MG per tablet 1-2 tabs po q6 hours prn pain 09/26/12   Leanora Cover, MD  oxyCODONE-acetaminophen [facility]) 5-325 MG per tablet 1-2 tabs po q6 hours prn pain 10/24/12   Leanora Cover, MD  rOPINIRole (REQUIP) 4 MG tablet Take 4 mg by mouth at bedtime.    Historical Provider, MD  simvastatin (ZOCOR) 40 MG tablet Take 40 mg by mouth daily.    Historical Provider, MD  zolpidem (AMBIEN) 10 MG tablet Take 10 mg by mouth at bedtime as needed for sleep.    Historical Provider, MD   BP 115/81 mmHg  Pulse 90  Temp(Src) 98.6 F (37 C) (Oral)  Resp 18  Ht 5\' 8"  (1.727 m)  Wt 235 lb (106.595 kg)  BMI 35.74 kg/m2  SpO2 100%   Physical Exam  Constitutional: He is oriented to person, place, and time. He appears well-developed and well-nourished. No distress.  HENT:  Head: Normocephalic and atraumatic.  Eyes: Conjunctivae and EOM are normal. Pupils are equal, round, and reactive to light.  Neck: Normal range of motion. Neck supple. No JVD  present.  Cardiovascular: Normal rate, regular rhythm and normal heart sounds.   No murmur heard. Pulmonary/Chest: Effort normal and breath sounds normal. He has no wheezes. He has no rales. He exhibits no tenderness.  Abdominal: Soft. He exhibits no mass. There is tenderness. There is rebound. There is no guarding.  Tenderness well localized to McBurney's area RLQ with rebound tenderness Bowel sounds decreased  Musculoskeletal: Normal range of motion. He exhibits no edema.  Lymphadenopathy:    He has no cervical adenopathy.  Neurological: He is alert and oriented to person, place, and time. No cranial nerve deficit. He exhibits normal muscle tone. Coordination normal.  Skin: Skin is warm and dry. No rash noted.  Psychiatric: He has a normal mood and affect. His behavior is normal. Judgment and thought content normal.  Nursing note and vitals reviewed.   ED Course  Procedures (including critical care time)  DIAGNOSTIC STUDIES: Oxygen Saturation is 100% on RA, normal by my interpretation.    COORDINATION OF CARE: 12:03 AM-Discussed treatment plan which includes CT A/P with pt at bedside and pt agreed to plan.   Labs Review Results for orders placed or performed during the hospital encounter of 06/28/15  Urinalysis, Routine w reflex microscopic (not at [facility])  Result Value Ref Range   Color, Urine AMBER (A) YELLOW   APPearance CLEAR CLEAR   Specific Gravity, Urine 1.026 1.005 - 1.030   pH 5.5 5.0 - 8.0   Glucose, UA NEGATIVE NEGATIVE mg/dL   Hgb urine dipstick NEGATIVE NEGATIVE   Bilirubin Urine NEGATIVE NEGATIVE   Ketones, ur NEGATIVE NEGATIVE mg/dL   Protein, ur 30 (A) NEGATIVE mg/dL   Nitrite NEGATIVE NEGATIVE   Leukocytes, UA NEGATIVE NEGATIVE  Lipase, blood  Result Value Ref Range   Lipase 27 11 - 51 U/L  Comprehensive metabolic panel  Result Value Ref Range   Sodium 137 135 - 145 mmol/L   Potassium 3.5 3.5 - 5.1 mmol/L   Chloride 97 (L) 101 - 111 mmol/L   CO2 30 22  - 32 mmol/L   Glucose, Bld 106 (H) 65 - 99 mg/dL   BUN 15 6 - 20 mg/dL   Creatinine, Ser 1.02 0.61 - 1.24 mg/dL   Calcium 9.6 8.9 - 10.3 mg/dL   Total Protein 6.7 6.5 - 8.1 g/dL   Albumin 4.0 3.5 - 5.0 g/dL   AST 35 15 - 41 U/L   ALT 31 17 - 63 U/L   Alkaline Phosphatase 68 38 - 126 U/L   Total Bilirubin 1.3 (H) 0.3 - 1.2 mg/dL   GFR calc non Af Amer >60 >60 mL/min   GFR calc Af Amer >60 >60 mL/min   Anion gap 10 5 - 15  CBC  Result Value Ref Range   WBC 10.7 (H) 4.0 - 10.5 K/uL   RBC 4.35 4.22 - 5.81 MIL/uL   Hemoglobin 13.5 13.0 - 17.0 g/dL   HCT 40.3 39.0 - 52.0 %   MCV 92.6 78.0 - 100.0 fL   MCH 31.0 26.0 - 34.0 pg   MCHC 33.5 30.0 - 36.0 g/dL   RDW 13.9 11.5 - 15.5 %   Platelets 262 150 - 400 K/uL  Urine microscopic-add on  Result Value Ref Range   Squamous Epithelial / LPF 0-5 (A) NONE SEEN   WBC, UA 0-5 0 - 5 WBC/hpf   RBC / HPF 0-5 0 - 5 RBC/hpf   Bacteria, UA RARE (A) NONE SEEN   Casts HYALINE CASTS (A) NEGATIVE   I have personally reviewed and evaluated these images and lab results as part of my medical   MDM   Final diagnoses:  Right lower quadrant abdominal pain    Abdominal pain strongly suggestive of appendicitis. He is given IV fluids and ondansetron and morphine for pain. CT is ordered but unfortunately, CT scan of his down for repairs. Laboratory workup is significant for mild leukocytosis and minimal elevation of bilirubin which is probably not clinically significant. Case is discussed with Dr. Zella Richer who requests patient be transferred to was a long emergency department and CT scan obtained over there. Case is discussed with Dr. Leonides Schanz, ED physician was a long hospital, who accepts the patient in  transfer. He was empirically started on antibiotics for appendicitis-given ceftriaxone and metronidazole. Old records were reviewed and he has no relevant past visits.  I personally performed the services described in this documentation, which was scribed in my  presence. The recorded information has been reviewed and is accurate.        Delora Fuel, MD [id number] [phone]

## 2015-06-29 ENCOUNTER — Emergency Department (HOSPITAL_COMMUNITY): Payer: 59

## 2015-06-29 ENCOUNTER — Encounter (HOSPITAL_COMMUNITY): Payer: Self-pay

## 2015-06-29 ENCOUNTER — Emergency Department (HOSPITAL_BASED_OUTPATIENT_CLINIC_OR_DEPARTMENT_OTHER): Payer: 59

## 2015-06-29 DIAGNOSIS — I10 Essential (primary) hypertension: Secondary | ICD-10-CM | POA: Diagnosis present

## 2015-06-29 DIAGNOSIS — K8020 Calculus of gallbladder without cholecystitis without obstruction: Secondary | ICD-10-CM | POA: Diagnosis present

## 2015-06-29 DIAGNOSIS — F329 Major depressive disorder, single episode, unspecified: Secondary | ICD-10-CM | POA: Diagnosis present

## 2015-06-29 DIAGNOSIS — R1031 Right lower quadrant pain: Secondary | ICD-10-CM | POA: Diagnosis present

## 2015-06-29 DIAGNOSIS — I880 Nonspecific mesenteric lymphadenitis: Secondary | ICD-10-CM | POA: Diagnosis present

## 2015-06-29 DIAGNOSIS — G4733 Obstructive sleep apnea (adult) (pediatric): Secondary | ICD-10-CM | POA: Diagnosis present

## 2015-06-29 DIAGNOSIS — G2581 Restless legs syndrome: Secondary | ICD-10-CM | POA: Diagnosis present

## 2015-06-29 DIAGNOSIS — M069 Rheumatoid arthritis, unspecified: Secondary | ICD-10-CM | POA: Diagnosis present

## 2015-06-29 DIAGNOSIS — K219 Gastro-esophageal reflux disease without esophagitis: Secondary | ICD-10-CM | POA: Diagnosis present

## 2015-06-29 DIAGNOSIS — Z87891 Personal history of nicotine dependence: Secondary | ICD-10-CM | POA: Diagnosis not present

## 2015-06-29 DIAGNOSIS — Z9884 Bariatric surgery status: Secondary | ICD-10-CM | POA: Diagnosis not present

## 2015-06-29 DIAGNOSIS — Z96653 Presence of artificial knee joint, bilateral: Secondary | ICD-10-CM | POA: Diagnosis present

## 2015-06-29 LAB — I-STAT CG4 LACTIC ACID, ED: Lactic Acid, Venous: 0.61 mmol/L (ref 0.5–2.0)

## 2015-06-29 MED ORDER — METOPROLOL SUCCINATE ER 25 MG PO TB24
25.0000 mg | ORAL_TABLET | Freq: Every day | ORAL | Status: DC
  Administered 2015-06-29 – 2015-07-02 (×4): 25 mg via ORAL
  Filled 2015-06-29 (×4): qty 1

## 2015-06-29 MED ORDER — TAMSULOSIN HCL 0.4 MG PO CAPS
0.4000 mg | ORAL_CAPSULE | Freq: Every day | ORAL | Status: DC
  Administered 2015-06-29 – 2015-07-02 (×4): 0.4 mg via ORAL
  Filled 2015-06-29 (×4): qty 1

## 2015-06-29 MED ORDER — ONDANSETRON HCL 4 MG/2ML IJ SOLN
4.0000 mg | Freq: Once | INTRAMUSCULAR | Status: AC
  Administered 2015-06-29: 4 mg via INTRAVENOUS
  Filled 2015-06-29: qty 2

## 2015-06-29 MED ORDER — METRONIDAZOLE IN NACL 5-0.79 MG/ML-% IV SOLN
500.0000 mg | Freq: Once | INTRAVENOUS | Status: AC
  Administered 2015-06-29: 500 mg via INTRAVENOUS
  Filled 2015-06-29: qty 100

## 2015-06-29 MED ORDER — SODIUM CHLORIDE 0.9 % IV SOLN
1000.0000 mL | Freq: Once | INTRAVENOUS | Status: AC
  Administered 2015-06-29: 1000 mL via INTRAVENOUS

## 2015-06-29 MED ORDER — METRONIDAZOLE IN NACL 5-0.79 MG/ML-% IV SOLN
500.0000 mg | Freq: Three times a day (TID) | INTRAVENOUS | Status: DC
  Administered 2015-06-29 – 2015-07-01 (×7): 500 mg via INTRAVENOUS
  Filled 2015-06-29 (×7): qty 100

## 2015-06-29 MED ORDER — HEPARIN SODIUM (PORCINE) 5000 UNIT/ML IJ SOLN
5000.0000 [IU] | Freq: Three times a day (TID) | INTRAMUSCULAR | Status: DC
  Administered 2015-06-29 (×3): 5000 [IU] via SUBCUTANEOUS
  Filled 2015-06-29 (×7): qty 1

## 2015-06-29 MED ORDER — DIPHENHYDRAMINE HCL 50 MG/ML IJ SOLN
25.0000 mg | Freq: Once | INTRAMUSCULAR | Status: AC
  Administered 2015-06-29: 25 mg via INTRAVENOUS
  Filled 2015-06-29: qty 1

## 2015-06-29 MED ORDER — HYDROCHLOROTHIAZIDE 25 MG PO TABS
25.0000 mg | ORAL_TABLET | Freq: Every day | ORAL | Status: DC
  Administered 2015-06-29 – 2015-07-02 (×4): 25 mg via ORAL
  Filled 2015-06-29 (×4): qty 1

## 2015-06-29 MED ORDER — KCL IN DEXTROSE-NACL 20-5-0.9 MEQ/L-%-% IV SOLN
INTRAVENOUS | Status: DC
  Administered 2015-06-29 – 2015-07-01 (×5): via INTRAVENOUS
  Filled 2015-06-29 (×8): qty 1000

## 2015-06-29 MED ORDER — DULOXETINE HCL 60 MG PO CPEP
60.0000 mg | ORAL_CAPSULE | Freq: Two times a day (BID) | ORAL | Status: DC
  Administered 2015-06-29 – 2015-07-02 (×7): 60 mg via ORAL
  Filled 2015-06-29 (×8): qty 1

## 2015-06-29 MED ORDER — CEFTRIAXONE SODIUM 1 G IJ SOLR
1.0000 g | Freq: Once | INTRAMUSCULAR | Status: AC
  Administered 2015-06-29: 1 g via INTRAVENOUS
  Filled 2015-06-29: qty 10

## 2015-06-29 MED ORDER — SODIUM CHLORIDE 0.9 % IV SOLN: 1000.0000 mL | INTRAVENOUS | Status: DC

## 2015-06-29 MED ORDER — ZOLPIDEM TARTRATE 10 MG PO TABS
10.0000 mg | ORAL_TABLET | Freq: Every evening | ORAL | Status: DC | PRN
  Administered 2015-06-29: 10 mg via ORAL
  Filled 2015-06-29: qty 1

## 2015-06-29 MED ORDER — ROPINIROLE HCL 1 MG PO TABS
4.0000 mg | ORAL_TABLET | Freq: Every evening | ORAL | Status: DC
  Administered 2015-06-29: 4 mg via ORAL
  Filled 2015-06-29 (×2): qty 4

## 2015-06-29 MED ORDER — ONDANSETRON 4 MG PO TBDP: 4.0000 mg | ORAL_TABLET | Freq: Four times a day (QID) | ORAL | Status: DC | PRN

## 2015-06-29 MED ORDER — DEXTROSE 5 % IV SOLN
2.0000 g | INTRAVENOUS | Status: DC
  Administered 2015-06-29 – 2015-07-02 (×4): 2 g via INTRAVENOUS
  Filled 2015-06-29 (×5): qty 2

## 2015-06-29 MED ORDER — ONDANSETRON HCL 4 MG/2ML IJ SOLN
4.0000 mg | INTRAMUSCULAR | Status: DC | PRN
  Administered 2015-06-29: 4 mg via INTRAVENOUS
  Filled 2015-06-29: qty 2

## 2015-06-29 MED ORDER — IOHEXOL 300 MG/ML  SOLN
100.0000 mL | Freq: Once | INTRAMUSCULAR | Status: AC | PRN
  Administered 2015-06-29: 100 mL via INTRAVENOUS

## 2015-06-29 MED ORDER — PANTOPRAZOLE SODIUM 40 MG IV SOLR
40.0000 mg | Freq: Every day | INTRAVENOUS | Status: DC
  Administered 2015-06-29 – 2015-07-01 (×3): 40 mg via INTRAVENOUS
  Filled 2015-06-29 (×3): qty 40

## 2015-06-29 MED ORDER — HYDROXYCHLOROQUINE SULFATE 200 MG PO TABS
200.0000 mg | ORAL_TABLET | Freq: Two times a day (BID) | ORAL | Status: DC
  Administered 2015-06-29 – 2015-07-02 (×7): 200 mg via ORAL
  Filled 2015-06-29 (×8): qty 1

## 2015-06-29 MED ORDER — MORPHINE SULFATE (PF) 4 MG/ML IV SOLN
4.0000 mg | Freq: Once | INTRAVENOUS | Status: AC
  Administered 2015-06-29: 4 mg via INTRAVENOUS
  Filled 2015-06-29: qty 1

## 2015-06-29 MED ORDER — SODIUM CHLORIDE 0.9 % IV SOLN
INTRAVENOUS | Status: DC
  Administered 2015-06-29: 05:00:00 via INTRAVENOUS

## 2015-06-29 NOTE — ED Provider Notes (Addendum)
4:10 AM  Pt is a 65 y.o. male with hypertension, GERD who was transferred from [facility] with right lower quadrant pain for the past 2 days. Had fever at home of 102, nausea, anorexia. Does have a mild leukocytosis of 10.7. Otherwise labs, urine unremarkable. Unable to perform CT scan at [facility] as their CT scan was broken. Transferred here for imaging. Dr. Zella Richer with surgery is aware of patient and would like for [place] to proceed with CT scan. Patient has already drank CT contrast. Have updated CT technician. He denies any pain medicine right now. He is currently NPO.  He is hemodynamically stable. Tender in the right lower quadrant with guarding. Nonsurgical abdomen.  5:20 AM  Pt's  CT scan shows inflammation of the mesentery in the right abdomen. This could be mesenteric panniculitis when I discussed CT scan with Dr. Radene Knee , radiologist who read patient's imaging. He states the appendix appears to be normal caliber but inflammation is around the appendix. We'll discuss with general surgery on call to review patient's imaging for further recommendations. His tenderness is localized in the right lower quadrant at McBurney's point.   5:30 AM  D/w Dr. Zella Richer.  Surgeon on call in AM will see pt in the [facility] ED for recommendations.   Patient family updated. Patient declines pain medication.  He is NPO.      [place], DO 06/29/15 [phone]

## 2015-06-29 NOTE — ED Notes (Signed)
Bed: [id number] Expected date:  Expected time:  Means of arrival:  Comments: Tx from [facility]

## 2015-06-29 NOTE — ED Notes (Signed)
MD at bedside discussing plan of care.

## 2015-06-29 NOTE — Progress Notes (Signed)
Patient arrived to floor at around 0930 this am from ED. Alert and oriented x3. Pleasant. Oriented to environment. Will continue to monitor.

## 2015-06-29 NOTE — ED Notes (Signed)
Report given to Butch Penny, RN-will transport to 3 rd floor

## 2015-06-29 NOTE — H&P (Signed)
David [name] is an 65 y.o. male.   Chief Complaint:  Worsening lower abdominal pain HPI:    He was in his normal state of health until Sunday when he began developing some mild lower abdominal aching pain. Pain started to progress and became intermittently sharp and moved just a little bit to the right of the midline. He tried taking some Pepto-Bismol but only got transient relief from this. Monday he tried walking around some and this helped the pain a little bit but later in the evening and increased pain came back. Associated with the pain he had subjective fever, chills, sweats, nausea, and dry heaves. No diarrhea. He's been passing less gas. He went to [employer] last night and was noted to have some right lower quadrant tenderness on exam there and a slightly elevated white blood cell count. There was concern for appendicitis but no CT scan was able to be done there at that time. He stops golly was transferred to [facility] where he underwent a CT scan which demonstrated a normal-appearing appendix but some inflammatory changes in the mesentery in the right mid abdomen and right lower quadrant area.Gallstones were also present on the scan. We were asked to see him because of the concern for possible appendicitis despite the CT scan.  Past Medical History  Diagnosis Date  . Dysrhythmia     had some tachycardia with steroids  . Hypertension   . Depression   . RLS (restless legs syndrome)   . Arthritis   . GERD (gastroesophageal reflux disease)     no meds since gastric banding  . Sleep apnea     could not use a cpap  . Shortness of breath     DOE  . Wears glasses     driving  . Carpal tunnel syndrome   . Hyperlipemia   . Status post gastric banding   . Snores   . Headache(784.0)     Ocular Migraines - takes Ambien for it    Past Surgical History  Procedure Laterality Date  . Tonsillectomy    . Laparoscopic gastric banding  03/15/2009  . Muscle biopsy  2004    rt  thigh to r/o myocitis  . Excision melanoma with sentinel lymph node biopsy  1984    rt neck with mulpipal nodes and some muscle excised rt neck-shoulder  . Total knee arthroplasty Bilateral 12/05/2010  . Carpal tunnel release Right 09/26/2012    Procedure: CARPAL TUNNEL RELEASE;  Surgeon: Tennis Must, MD;  Location: [place];  Service: Orthopedics;  Laterality: Right;  . Trigger finger release Right 09/26/2012    Procedure: RELEASE TRIGGER FINGER/A-1 PULLEY RING AND SMALL ;  Surgeon: Tennis Must, MD;  Location: [place];  Service: Orthopedics;  Laterality: Right;  . Carpal tunnel release Left 10/24/2012    Procedure: CARPAL TUNNEL RELEASE;  Surgeon: Tennis Must, MD;  Location: [place];  Service: Orthopedics;  Laterality: Left;  . Trigger finger release Left 10/24/2012    Procedure: RELEASE TRIGGER FINGER/A-1 PULLEY LEFT MIDDLE AND LEFT RING;  Surgeon: Tennis Must, MD;  Location: [place];  Service: Orthopedics;  Laterality: Left;  . Knee replacement      History reviewed. No pertinent family history. Social History:  reports that he quit smoking about 13 years ago. He does not have any smokeless tobacco history on file. He reports that he drinks alcohol. He reports that he does  not use illicit drugs.  Allergies:  Allergies  Allergen Reactions  . Codeine Itching  . Dilaudid [Hydromorphone Hcl] Itching  . Fentanyl Itching    Pt states Fentanyl patch causes itching  . Nucynta [Tapentadol]     MENTAL PROBLEMS  . Penicillins Itching    Has patient had a PCN reaction causing immediate rash, facial/tongue/throat swelling, SOB or lightheadedness with hypotension:unsure childhood reaction Has patient had a PCN reaction causing severe rash involving mucus membranes or skin necrosis:unsure Has patient had a PCN reaction that required hospitalization:No Has patient had a PCN reaction occurring within the last 10 years:NO If all  of the above answers are "NO", then may proceed with Cephalosporin use.      (Not in a hospital admission)  Results for orders placed or performed during the hospital encounter of 06/28/15 (from the past 48 hour(s))  Urinalysis, Routine w reflex microscopic (not at [facility])     Status: Abnormal   Collection Time: 06/28/15  9:55 PM  Result Value Ref Range   Color, Urine AMBER (A) YELLOW    Comment: BIOCHEMICALS MAY BE AFFECTED BY COLOR   APPearance CLEAR CLEAR   Specific Gravity, Urine 1.026 1.005 - 1.030   pH 5.5 5.0 - 8.0   Glucose, UA NEGATIVE NEGATIVE mg/dL   Hgb urine dipstick NEGATIVE NEGATIVE   Bilirubin Urine NEGATIVE NEGATIVE   Ketones, ur NEGATIVE NEGATIVE mg/dL   Protein, ur 30 (A) NEGATIVE mg/dL   Nitrite NEGATIVE NEGATIVE   Leukocytes, UA NEGATIVE NEGATIVE  Urine microscopic-add on     Status: Abnormal   Collection Time: 06/28/15  9:55 PM  Result Value Ref Range   Squamous Epithelial / LPF 0-5 (A) NONE SEEN   WBC, UA 0-5 0 - 5 WBC/hpf   RBC / HPF 0-5 0 - 5 RBC/hpf   Bacteria, UA RARE (A) NONE SEEN   Casts HYALINE CASTS (A) NEGATIVE  Lipase, blood     Status: None   Collection Time: 06/28/15 10:01 PM  Result Value Ref Range   Lipase 27 11 - 51 U/L  Comprehensive metabolic panel     Status: Abnormal   Collection Time: 06/28/15 10:01 PM  Result Value Ref Range   Sodium 137 135 - 145 mmol/L   Potassium 3.5 3.5 - 5.1 mmol/L   Chloride 97 (L) 101 - 111 mmol/L   CO2 30 22 - 32 mmol/L   Glucose, Bld 106 (H) 65 - 99 mg/dL   BUN 15 6 - 20 mg/dL   Creatinine, Ser 1.02 0.61 - 1.24 mg/dL   Calcium 9.6 8.9 - 10.3 mg/dL   Total Protein 6.7 6.5 - 8.1 g/dL   Albumin 4.0 3.5 - 5.0 g/dL   AST 35 15 - 41 U/L   ALT 31 17 - 63 U/L   Alkaline Phosphatase 68 38 - 126 U/L   Total Bilirubin 1.3 (H) 0.3 - 1.2 mg/dL   GFR calc non Af Amer >60 >60 mL/min   GFR calc Af Amer >60 >60 mL/min    Comment: (NOTE) The eGFR has been calculated using the CKD EPI equation. This calculation  has not been validated in all clinical situations. eGFR's persistently <60 mL/min signify possible Chronic Kidney Disease.    Anion gap 10 5 - 15  CBC     Status: Abnormal   Collection Time: 06/28/15 10:01 PM  Result Value Ref Range   WBC 10.7 (H) 4.0 - 10.5 K/uL   RBC 4.35 4.22 - 5.81 MIL/uL  Hemoglobin 13.5 13.0 - 17.0 g/dL   HCT 40.3 39.0 - 52.0 %   MCV 92.6 78.0 - 100.0 fL   MCH 31.0 26.0 - 34.0 pg   MCHC 33.5 30.0 - 36.0 g/dL   RDW 13.9 11.5 - 15.5 %   Platelets 262 150 - 400 K/uL  I-Stat CG4 Lactic Acid, ED     Status: None   Collection Time: 06/29/15  6:01 AM  Result Value Ref Range   Lactic Acid, Venous 0.61 0.5 - 2.0 mmol/L   Ct Abdomen Pelvis W Contrast  06/29/2015  CLINICAL DATA:  Acute onset of mid abdominal pain, radiating to the right lower quadrant. Initial encounter. EXAM: CT ABDOMEN AND PELVIS WITH CONTRAST TECHNIQUE: Multidetector CT imaging of the abdomen and pelvis was performed using the standard protocol following bolus administration of intravenous contrast. CONTRAST:  163m OMNIPAQUE IOHEXOL 300 MG/ML  SOLN COMPARISON:  Abdominal ultrasound performed 11/12/2008 FINDINGS: Minimal bibasilar atelectasis or scarring is noted. The patient's gastric band is grossly unremarkable in appearance, noted extending to the port at the right mid abdomen. The liver and spleen are unremarkable in appearance. Stones are noted dependently within the gallbladder. The gallbladder is otherwise unremarkable. The pancreas and adrenal glands are unremarkable. The kidneys are unremarkable in appearance. There is no evidence of hydronephrosis. No renal or ureteral stones are seen. Minimal nonspecific perinephric stranding is noted bilaterally. There is focal mesenteric inflammation noted tracking from the mid abdomen to the right lower quadrant, which may reflect an infectious or inflammatory process. The associated small bowel loops are decompressed and grossly unremarkable in appearance. No  free fluid is identified. The stomach is within normal limits. No acute vascular abnormalities are seen. Minimal calcification is noted along the distal abdominal aorta and its branches. The appendix is normal in caliber, without evidence of appendicitis. Contrast progresses to the level of the splenic flexure of the colon. The colon is grossly unremarkable. The bladder is mildly distended and grossly unremarkable. The prostate remains normal in size. No inguinal lymphadenopathy is seen. No acute osseous abnormalities are identified. IMPRESSION: 1. Focal mesenteric inflammation tracking from the mid abdomen to the right lower quadrant, which may reflect an infectious or inflammatory process. Associated small bowel loops are decompressed and grossly unremarkable. 2. Gastric band is unremarkable in appearance. 3. Cholelithiasis.  Gallbladder otherwise unremarkable. Electronically Signed   By: JGarald BaldingM.D.   On: 06/29/2015 04:37    Review of Systems  Constitutional: Positive for fever, chills and diaphoresis.  HENT: Negative for congestion.   Respiratory: Negative for shortness of breath.   Cardiovascular: Negative for chest pain.  Gastrointestinal: Positive for nausea and abdominal pain.  Genitourinary: Negative for dysuria and hematuria.  Neurological: Positive for dizziness.    Blood pressure 144/86, pulse 74, temperature 98.4 F (36.9 C), temperature source Oral, resp. rate 18, height _0  (1.727 m), weight 106.595 kg (235 lb), SpO2 94 %. Physical Exam  Constitutional:  Overweight male in no acute distress.  HENT:  Head: Normocephalic and atraumatic.  Eyes: No scleral icterus.  Cardiovascular: Normal rate and regular rhythm.   Respiratory: Effort normal and breath sounds normal.  GI: Soft. There is tenderness (Mild tendernessin the hypogastric area and left lower quadrant to deep palpation. No right lower quadrant tenderness.).  Small scars noted and a palpable port in the right mid  abdomen.  Musculoskeletal: He exhibits no edema.  Neurological: He is alert.  Skin: Skin is warm and dry.  Psychiatric: He has  a normal mood and affect. His behavior is normal.     Assessment/Plan Lower abdominal pain and mesenteric inflammatory changes in the right midabdomen and right lower quadrant. Appendix appears normal on CT scan and there is no right lower quadrant tenderness on current exam. He received IV Rocephin and Flagyl and states since then he is feeling some better. Differential diagnosis includes mesenteric adenitis, small bowel diverticulitis, acute appendicitis (although I feel this is less likely given the exam and CT findings).  Plan: Admit to the hospital. Continue intravenous antibiotics. Serial exams and check white blood cell count tomorrow. If situation does not get better or worsens, we discussed doing a diagnostic laparoscopy. He seems to understand all of this and agrees with the plan.  Odis Hollingshead, MD 06/29/2015, 7:44 AM

## 2015-06-30 LAB — CBC WITH DIFFERENTIAL/PLATELET
Basophils Absolute: 0 10*3/uL (ref 0.0–0.1)
Basophils Relative: 1 %
Eosinophils Absolute: 0.1 10*3/uL (ref 0.0–0.7)
Eosinophils Relative: 2 %
HCT: 38.4 % — ABNORMAL LOW (ref 39.0–52.0)
Hemoglobin: 12.7 g/dL — ABNORMAL LOW (ref 13.0–17.0)
Lymphocytes Relative: 28 %
Lymphs Abs: 1.4 10*3/uL (ref 0.7–4.0)
MCH: 31.6 pg (ref 26.0–34.0)
MCHC: 33.1 g/dL (ref 30.0–36.0)
MCV: 95.5 fL (ref 78.0–100.0)
Monocytes Absolute: 0.6 10*3/uL (ref 0.1–1.0)
Monocytes Relative: 11 %
Neutro Abs: 3 10*3/uL (ref 1.7–7.7)
Neutrophils Relative %: 58 %
Platelets: 282 10*3/uL (ref 150–400)
RBC: 4.02 MIL/uL — ABNORMAL LOW (ref 4.22–5.81)
RDW: 14.2 % (ref 11.5–15.5)
WBC: 5.1 10*3/uL (ref 4.0–10.5)

## 2015-06-30 MED ORDER — HYDROCODONE-ACETAMINOPHEN 10-325 MG PO TABS
1.0000 | ORAL_TABLET | Freq: Four times a day (QID) | ORAL | Status: DC | PRN
  Administered 2015-06-30 (×2): 1 via ORAL
  Filled 2015-06-30 (×3): qty 1

## 2015-06-30 MED ORDER — SULFASALAZINE 500 MG PO TBEC
500.0000 mg | DELAYED_RELEASE_TABLET | Freq: Two times a day (BID) | ORAL | Status: DC
  Administered 2015-06-30 – 2015-07-02 (×5): 500 mg via ORAL
  Filled 2015-06-30 (×7): qty 1

## 2015-06-30 MED ORDER — ROPINIROLE HCL 1 MG PO TABS
4.0000 mg | ORAL_TABLET | Freq: Every day | ORAL | Status: DC
  Administered 2015-06-30 – 2015-07-01 (×2): 4 mg via ORAL
  Filled 2015-06-30 (×3): qty 4

## 2015-06-30 MED ORDER — MORPHINE SULFATE ER 15 MG PO TBCR
15.0000 mg | EXTENDED_RELEASE_TABLET | Freq: Three times a day (TID) | ORAL | Status: DC
  Administered 2015-06-30 – 2015-07-02 (×6): 15 mg via ORAL
  Filled 2015-06-30 (×6): qty 1

## 2015-06-30 NOTE — Progress Notes (Signed)
Pt mentioned he had some hallucinations during the night to RN during rounding.

## 2015-06-30 NOTE — Progress Notes (Addendum)
Subjective: He is having Hallucinations talking to a dead friend this AM.  No sleep for 48 hours. He has been off his chronic pain meds for 48 hours.  Got his Requip at 5 yesterday and shakes all over, all night.  His abdomen is better, but still having some pain.  He has had hallucinations before with variations of being off his meds or changes with his sleep pattern before.  Objective: Vital signs in last 24 hours: Temp:  [97.7 F (36.5 C)-98.2 F (36.8 C)] 98.2 F (36.8 C) (02/08 0557) Pulse Rate:  [59-72] 72 (02/08 0557) Resp:  [16-18] 16 (02/08 0557) BP: (115-129)/(56-77) 115/73 mmHg (02/08 0557) SpO2:  [96 %-98 %] 98 % (02/08 0557) Weight:  [107.2 kg (236 lb 5.3 oz)] 107.2 kg (236 lb 5.3 oz) (02/07 0954) Last BM Date: 06/29/15 480 PO 1075 urine; + BM x 1 Afebrile, VSS WBC is back to normal, H/H stable Intake/Output from previous day: 02/07 0701 - 02/08 0700 In: 1923.3 [P.O.:480; I.V.:1243.3; IV Piggyback:200] Out: 1075 [Urine:1075] Intake/Output this shift:    General appearance: alert, cooperative, no distress and he is with it now.  his wife reported he was talking with dead friend earlier this AM. Resp: clear to auscultation bilaterally GI: soft, still some pain in the mid upper abdomen, but better.  + BS, having some diarrhea also. Extremities: extremities normal, atraumatic, no cyanosis or edema  Lab Results:   Recent Labs  06/28/15 2201 06/30/15 0423  WBC 10.7* 5.1  HGB 13.5 12.7*  HCT 40.3 38.4*  PLT 262 282    BMET  Recent Labs  06/28/15 2201  NA 137  K 3.5  CL 97*  CO2 30  GLUCOSE 106*  BUN 15  CREATININE 1.02  CALCIUM 9.6   PT/INR No results for input(s): LABPROT, INR in the last 72 hours.   Recent Labs Lab 06/28/15 2201  AST 35  ALT 31  ALKPHOS 68  BILITOT 1.3*  PROT 6.7  ALBUMIN 4.0     Lipase     Component Value Date/Time   LIPASE 27 06/28/2015 2201     Studies/Results: Ct Abdomen Pelvis W Contrast  06/29/2015   CLINICAL DATA:  Acute onset of mid abdominal pain, radiating to the right lower quadrant. Initial encounter. EXAM: CT ABDOMEN AND PELVIS WITH CONTRAST TECHNIQUE: Multidetector CT imaging of the abdomen and pelvis was performed using the standard protocol following bolus administration of intravenous contrast. CONTRAST:  128mL OMNIPAQUE IOHEXOL 300 MG/ML  SOLN COMPARISON:  Abdominal ultrasound performed 11/12/2008 FINDINGS: Minimal bibasilar atelectasis or scarring is noted. The patient's gastric band is grossly unremarkable in appearance, noted extending to the port at the right mid abdomen. The liver and spleen are unremarkable in appearance. Stones are noted dependently within the gallbladder. The gallbladder is otherwise unremarkable. The pancreas and adrenal glands are unremarkable. The kidneys are unremarkable in appearance. There is no evidence of hydronephrosis. No renal or ureteral stones are seen. Minimal nonspecific perinephric stranding is noted bilaterally. There is focal mesenteric inflammation noted tracking from the mid abdomen to the right lower quadrant, which may reflect an infectious or inflammatory process. The associated small bowel loops are decompressed and grossly unremarkable in appearance. No free fluid is identified. The stomach is within normal limits. No acute vascular abnormalities are seen. Minimal calcification is noted along the distal abdominal aorta and its branches. The appendix is normal in caliber, without evidence of appendicitis. Contrast progresses to the level of the splenic flexure of the  colon. The colon is grossly unremarkable. The bladder is mildly distended and grossly unremarkable. The prostate remains normal in size. No inguinal lymphadenopathy is seen. No acute osseous abnormalities are identified. IMPRESSION: 1. Focal mesenteric inflammation tracking from the mid abdomen to the right lower quadrant, which may reflect an infectious or inflammatory process. Associated  small bowel loops are decompressed and grossly unremarkable. 2. Gastric band is unremarkable in appearance. 3. Cholelithiasis.  Gallbladder otherwise unremarkable. Electronically Signed   By: Garald Balding M.D.   On: 06/29/2015 04:37    Medications: . cefTRIAXone (ROCEPHIN)  IV  2 g Intravenous Q24H   And  . metronidazole  500 mg Intravenous Q8H  . DULoxetine  60 mg Oral BID  . heparin  5,000 Units Subcutaneous 3 times per day  . hydrochlorothiazide  25 mg Oral Daily  . hydroxychloroquine  200 mg Oral BID  . metoprolol succinate  25 mg Oral Daily  . pantoprazole (PROTONIX) IV  40 mg Intravenous QHS  . rOPINIRole  4 mg Oral QPM  . tamsulosin  0.4 mg Oral Daily   . dextrose 5 % and 0.9 % NaCl with KCl 20 mEq/L 100 mL/hr at 06/29/15 2201   Prior to Admission medications   Medication Sig Start Date End Date Taking? Authorizing Provider  DULoxetine (CYMBALTA) 60 MG capsule Take 60 mg by mouth 2 (two) times daily.    Yes Historical Provider, MD  hydrochlorothiazide (HYDRODIURIL) 25 MG tablet Take 25 mg by mouth daily.   Yes Historical Provider, MD  HYDROcodone-acetaminophen (NORCO) 10-325 MG tablet Take 1 tablet by mouth every 6 (six) hours as needed for moderate pain.   Yes Historical Provider, MD  hydroxychloroquine (PLAQUENIL) 200 MG tablet Take 200 mg by mouth 2 (two) times daily. 04/15/15  Yes Historical Provider, MD  ibuprofen (ADVIL,MOTRIN) 200 MG tablet Take 400 mg by mouth every 6 (six) hours as needed (for pain.).   Yes Historical Provider, MD  Methotrexate, PF, 25 MG/0.5ML SOAJ Inject 25 mg into the skin every 7 (seven) days.    Yes Historical Provider, MD  metoprolol succinate (TOPROL-XL) 25 MG 24 hr tablet Take 25 mg by mouth daily. 11/24/14  Yes Historical Provider, MD  morphine (MS CONTIN) 15 MG 12 hr tablet Take 15 mg by mouth 3 (three) times daily as needed for pain.    Yes Historical Provider, MD  MOVANTIK 25 MG TABS tablet Take 25 mg by mouth daily. 06/11/15  Yes Historical  Provider, MD  Multiple Vitamins-Minerals (MULTIVITAMIN WITH MINERALS) tablet Take 1 tablet by mouth daily.    Yes Historical Provider, MD  rOPINIRole (REQUIP) 4 MG tablet Take 4 mg by mouth every evening.    Yes Historical Provider, MD  simvastatin (ZOCOR) 40 MG tablet Take 40 mg by mouth daily.   Yes Historical Provider, MD  sulfaSALAzine (AZULFIDINE) 500 MG EC tablet Take 500 mg by mouth 2 (two) times daily.  05/21/15  Yes Historical Provider, MD  tamsulosin (FLOMAX) 0.4 MG CAPS capsule Take 0.4 mg by mouth.   Yes Historical Provider, MD  zolpidem (AMBIEN) 10 MG tablet Take 10 mg by mouth at bedtime as needed for sleep.   Yes Historical Provider, MD     Assessment/Plan Abdominal pain with mesenteric inflammatory changes Possible mesenteric adenitis vs diverticulitis, vs appendicitis Hx of gastric banding Depression/Restless leg syndrome  Rheumatoid arthritis with chronic pain meds including morphine and Hydrocodone. Hallucinations this AM, sleep deprevations vs withdrawal sympotms Migraines Sleep apnea not on CPAP GERD Hypertension  Knee relpacements Carpal tunnel and trigger finger release  Antibiotics:  DAy 3 ceftriaxone/flagyl DVT:  Heparin/SCD   Plan:  I am getting him back on PO meds from home including Morphine and hydrocodone.  I fixed his requip timing also.   I will put him on full liquids and continue antibiotics.     LOS: 1 day    JENNINGS,WILLARD 06/30/2015  Agree with above. Better with meds today. He did not sleep much last PM.  Adjustments in narcotics should help.  Alphonsa Overall, MD, [facility] Surgery Pager: [phone] Office phone:  [phone]

## 2015-06-30 NOTE — Progress Notes (Signed)
MD, Patient would like all  home medications continued (in particular: Sulfasalazine, Simvastatin, Folic Acid,and Morphine). Thanks, David [name]

## 2015-07-01 MED ORDER — METRONIDAZOLE 500 MG PO TABS
500.0000 mg | ORAL_TABLET | Freq: Three times a day (TID) | ORAL | Status: DC
  Administered 2015-07-01 – 2015-07-02 (×3): 500 mg via ORAL
  Filled 2015-07-01 (×3): qty 1

## 2015-07-01 MED ORDER — CIPROFLOXACIN HCL 500 MG PO TABS
500.0000 mg | ORAL_TABLET | Freq: Two times a day (BID) | ORAL | Status: DC
Start: 2015-07-01 — End: 2015-07-02
  Administered 2015-07-01 – 2015-07-02 (×2): 500 mg via ORAL
  Filled 2015-07-01 (×2): qty 1

## 2015-07-01 NOTE — Progress Notes (Signed)
Subjective: Much better this AM, pain is much better, no nausea, say his gut is active after clears, but no pain.  His hallucinations are gone, and he slept well last PM, sleeping when I came in.  He had also been walking earlier this AM when I saw him.  Objective: Vital signs in last 24 hours: Temp:  [97.4 F (36.3 C)-98.7 F (37.1 C)] 97.4 F (36.3 C) (02/09 0550) Pulse Rate:  [60-75] 60 (02/09 0550) Resp:  [18-20] 20 (02/09 0550) BP: (126-134)/(73-90) 134/90 mmHg (02/09 0550) SpO2:  [96 %-97 %] 96 % (02/09 0550) Last BM Date: 06/30/15 480 PO yesterday, 500 this AM  Diet:  Clears  850 urine BM x 2 Afebrile, VSS WBC 5.1 this AM Intake/Output from previous day: 02/08 0701 - 02/09 0700 In: 4273.3 [P.O.:480; I.V.:3293.3; IV Piggyback:500] Out: 850 [Urine:850] Intake/Output this shift: Total I/O In: 500 [P.O.:500] Out: -   General appearance: alert, cooperative and no distress GI: soft, non-tender; bowel sounds normal; no masses,  no organomegaly  Lab Results:   Recent Labs  06/28/15 2201 06/30/15 0423  WBC 10.7* 5.1  HGB 13.5 12.7*  HCT 40.3 38.4*  PLT 262 282    BMET  Recent Labs  06/28/15 2201  NA 137  K 3.5  CL 97*  CO2 30  GLUCOSE 106*  BUN 15  CREATININE 1.02  CALCIUM 9.6   PT/INR No results for input(s): LABPROT, INR in the last 72 hours.   Recent Labs Lab 06/28/15 2201  AST 35  ALT 31  ALKPHOS 68  BILITOT 1.3*  PROT 6.7  ALBUMIN 4.0     Lipase     Component Value Date/Time   LIPASE 27 06/28/2015 2201     Studies/Results: No results found.  Medications: . cefTRIAXone (ROCEPHIN)  IV  2 g Intravenous Q24H   And  . metronidazole  500 mg Intravenous Q8H  . DULoxetine  60 mg Oral BID  . heparin  5,000 Units Subcutaneous 3 times per day  . hydrochlorothiazide  25 mg Oral Daily  . hydroxychloroquine  200 mg Oral BID  . metoprolol succinate  25 mg Oral Daily  . morphine  15 mg Oral 3 times per day  . pantoprazole (PROTONIX) IV   40 mg Intravenous QHS  . rOPINIRole  4 mg Oral QHS  . sulfaSALAzine  500 mg Oral BID  . tamsulosin  0.4 mg Oral Daily    Assessment/Plan Abdominal pain with mesenteric inflammatory changes Possible mesenteric adenitis vs diverticulitis, vs appendicitis Hx of gastric banding Depression/Restless leg syndrome Rheumatoid arthritis with chronic pain meds including morphine and Hydrocodone. Hallucinations this AM, sleep deprevations vs withdrawal symptoms, - now appear resolved   Migraines Sleep apnea not on CPAP GERD Hypertension  Knee relpacements Carpal tunnel and trigger finger release  Antibiotics: DAy 4 ceftriaxone/flagyl DVT: Heparin/SCD    Plan:  Full liquids and discuss when to convert to PO antibiotics.  1520 hours:  Tolerated his diet well, no pain.  His IV has come out so I will switch him to PO Flagyl.  Next Rocephin is due at AM.  If he is doing well we can switch him to PO Cipro in AM, or Augmentin and avoid further IV sticks.  I will recheck labs in AM.      LOS: 2 days    David [name],David 07/01/2015  Agree with above. It looks like he will ready to go home tomorrow. I showed him his CT scan and reviewed the findings.  I also showed him that he had gall stones. I would leave any further workup to his PCP.  If he remains asymptomatic, most likely there is nothing else to do.  Alphonsa Overall, MD, [facility] Surgery Pager: [phone] Office phone:  [phone]

## 2015-07-01 NOTE — Care Management Note (Signed)
Case Management Note  Patient Details  Name: David [name] MRN: [id number] Date of Birth: Nov 11, 1950  Subjective/Objective:       65 yo admitted with Mesenteric Adenitis             Action/Plan: From home with spouse  Expected Discharge Date:   (unknown)               Expected Discharge Plan:  Home/Self Care  In-House Referral:     Discharge planning Services  CM Consult  Post Acute Care Choice:    Choice offered to:     DME Arranged:    DME Agency:     HH Arranged:    HH Agency:     Status of Service:  In process, will continue to follow  Medicare Important Message Given:    Date Medicare IM Given:    Medicare IM give by:    Date Additional Medicare IM Given:    Additional Medicare Important Message give by:     If discussed at [place] of Stay Meetings, dates discussed:    Additional Comments: Chart reviewed and no CM needs identified or communicated at this time. CM will continue to follow. Marney Doctor RN,BSN,NCM [id number] Lynnell Catalan, RN 07/01/2015, 4:12 PM

## 2015-07-02 MED ORDER — CIPROFLOXACIN HCL 500 MG PO TABS: 500.0000 mg | ORAL_TABLET | Freq: Two times a day (BID) | ORAL | Status: DC

## 2015-07-02 MED ORDER — METRONIDAZOLE 500 MG PO TABS: 500.0000 mg | ORAL_TABLET | Freq: Three times a day (TID) | ORAL | Status: DC

## 2015-07-02 NOTE — Discharge Summary (Signed)
Physician Discharge Summary  David [name] [id number] DOB: [birth year] DOA: 06/28/2015  PCP: Orpah Melter, MD  Consultation: none  Admit date: 06/28/2015 Discharge date: 07/02/2015  Recommendations for Outpatient Follow-up:   Follow-up Information    Follow up with Orpah Melter, MD.   Specialty:  Family Medicine   Contact information:   [address] [place] [address] [phone]       Follow up with Shann Medal, MD.   Specialty:  General Surgery   Why:  As needed   Contact information:   [address] [place] [address] [phone]      Discharge Diagnoses:  1. Abdominal pain 2. Mesenteric adenitis   Surgical Procedure: none  Discharge Condition: stable Disposition: home  Diet recommendation: regular  [facility] Weights   06/28/15 2152 06/29/15 0954  Weight: 106.595 kg (235 lb) 107.2 kg (236 lb 5.3 oz)     Filed Vitals:   07/01/15 2155 07/02/15 0649  BP: 126/74 154/83  Pulse: 63 65  Temp: 97.9 F (36.6 C) 97.9 F (36.6 C)  Resp: 16 16     Hospital Course:  David [name] is a 65 year old male with a history of HTN, RLS, RA, gastric banding, depression,OSA, GERD who presented to [facility] with abdominal pain. CT scan which demonstrated a normal-appearing appendix but some inflammatory changes in the mesentery in the right mid abdomen and right lower quadrant area.Gallstones were also present on the scan.  He was admitted for observation and IV antibiotics. Did not develop peritoneal signs or signs of infection.  Diet was advanced.  antibiotics changed to oral.  On HD#4 he was felt stable for discharge home with additional 4 days of cipro/flagyl.  Warning signs that warrant further evaluation were discussed.  He knows to follow up with pcp and plans to have a colonoscopy done.     Physical Exam: General appearance: alert and oriented. Calm and cooperative No acute distress. VSS. Afebrile.  GI: soft round and nontender. +BS x4  quadrants. No organomegaly, hernias or masses.   Discharge Instructions     Medication List    TAKE these medications        ciprofloxacin 500 MG tablet  Commonly known as:  CIPRO  Take 1 tablet (500 mg total) by mouth 2 (two) times daily.     DULoxetine 60 MG capsule  Commonly known as:  CYMBALTA  Take 60 mg by mouth 2 (two) times daily.     hydrochlorothiazide 25 MG tablet  Commonly known as:  HYDRODIURIL  Take 25 mg by mouth daily.     HYDROcodone-acetaminophen 10-325 MG tablet  Commonly known as:  NORCO  Take 1 tablet by mouth every 6 (six) hours as needed for moderate pain.     hydroxychloroquine 200 MG tablet  Commonly known as:  PLAQUENIL  Take 200 mg by mouth 2 (two) times daily.     ibuprofen 200 MG tablet  Commonly known as:  ADVIL,MOTRIN  Take 400 mg by mouth every 6 (six) hours as needed (for pain.).     Methotrexate (PF) 25 MG/0.5ML Soaj  Inject 25 mg into the skin every 7 (seven) days.     metoprolol succinate 25 MG 24 hr tablet  Commonly known as:  TOPROL-XL  Take 25 mg by mouth daily.     metroNIDAZOLE 500 MG tablet  Commonly known as:  FLAGYL  Take 1 tablet (500 mg total) by mouth every 8 (eight) hours.  morphine 15 MG 12 hr tablet  Commonly known as:  MS CONTIN  Take 15 mg by mouth 3 (three) times daily as needed for pain.     MOVANTIK 25 MG Tabs tablet  Generic drug:  naloxegol oxalate  Take 25 mg by mouth daily.     multivitamin with minerals tablet  Take 1 tablet by mouth daily.     rOPINIRole 4 MG tablet  Commonly known as:  REQUIP  Take 4 mg by mouth every evening.     simvastatin 40 MG tablet  Commonly known as:  ZOCOR  Take 40 mg by mouth daily.     sulfaSALAzine 500 MG EC tablet  Commonly known as:  AZULFIDINE  Take 500 mg by mouth 2 (two) times daily.     tamsulosin 0.4 MG Caps capsule  Commonly known as:  FLOMAX  Take 0.4 mg by mouth.     zolpidem 10 MG tablet  Commonly known as:  AMBIEN  Take 10 mg by mouth at  bedtime as needed for sleep.           Follow-up Information    Follow up with Orpah Melter, MD.   Specialty:  Family Medicine   Contact information:   [place] [address] [phone]       Follow up with [facility], MD.   Specialty:  General Surgery   Why:  As needed   Contact information:   [address] [place]  [address] [phone]        The results of significant diagnostics from this hospitalization (including imaging, microbiology, ancillary and laboratory) are listed below for reference.    Significant Diagnostic Studies: Ct Abdomen Pelvis W Contrast  06/29/2015  CLINICAL DATA:  Acute onset of mid abdominal pain, radiating to the right lower quadrant. Initial encounter. EXAM: CT ABDOMEN AND PELVIS WITH CONTRAST TECHNIQUE: Multidetector CT imaging of the abdomen and pelvis was performed using the standard protocol following bolus administration of intravenous contrast. CONTRAST:  148mL OMNIPAQUE IOHEXOL 300 MG/ML  SOLN COMPARISON:  Abdominal ultrasound performed 11/12/2008 FINDINGS: Minimal bibasilar atelectasis or scarring is noted. The patient's gastric band is grossly unremarkable in appearance, noted extending to the port at the right mid abdomen. The liver and spleen are unremarkable in appearance. Stones are noted dependently within the gallbladder. The gallbladder is otherwise unremarkable. The pancreas and adrenal glands are unremarkable. The kidneys are unremarkable in appearance. There is no evidence of hydronephrosis. No renal or ureteral stones are seen. Minimal nonspecific perinephric stranding is noted bilaterally. There is focal mesenteric inflammation noted tracking from the mid abdomen to the right lower quadrant, which may reflect an infectious or inflammatory process. The associated small bowel loops are decompressed and grossly unremarkable in appearance. No free fluid is identified. The stomach is within normal  limits. No acute vascular abnormalities are seen. Minimal calcification is noted along the distal abdominal aorta and its branches. The appendix is normal in caliber, without evidence of appendicitis. Contrast progresses to the level of the splenic flexure of the colon. The colon is grossly unremarkable. The bladder is mildly distended and grossly unremarkable. The prostate remains normal in size. No inguinal lymphadenopathy is seen. No acute osseous abnormalities are identified. IMPRESSION: 1. Focal mesenteric inflammation tracking from the mid abdomen to the right lower quadrant, which may reflect an infectious or inflammatory process. Associated small bowel loops are decompressed and grossly unremarkable. 2. Gastric band is unremarkable in appearance.  3. Cholelithiasis.  Gallbladder otherwise unremarkable. Electronically Signed   By: Garald Balding M.D.   On: 06/29/2015 04:37    Microbiology: No results found for this or any previous visit (from the past 240 hour(s)).   Labs: Basic Metabolic Panel:  Recent Labs Lab 06/28/15 2201  NA 137  K 3.5  CL 97*  CO2 30  GLUCOSE 106*  BUN 15  CREATININE 1.02  CALCIUM 9.6   Liver Function Tests:  Recent Labs Lab 06/28/15 2201  AST 35  ALT 31  ALKPHOS 68  BILITOT 1.3*  PROT 6.7  ALBUMIN 4.0    Recent Labs Lab 06/28/15 2201  LIPASE 27   No results for input(s): AMMONIA in the last 168 hours. CBC:  Recent Labs Lab 06/28/15 2201 06/30/15 0423  WBC 10.7* 5.1  NEUTROABS  --  3.0  HGB 13.5 12.7*  HCT 40.3 38.4*  MCV 92.6 95.5  PLT 262 282   Cardiac Enzymes: No results for input(s): CKTOTAL, CKMB, CKMBINDEX, TROPONINI in the last 168 hours. BNP: BNP (last 3 results) No results for input(s): BNP in the last 8760 hours.  ProBNP (last 3 results) No results for input(s): PROBNP in the last 8760 hours.  CBG: No results for input(s): GLUCAP in the last 168 hours.  Active Problems:   Mesenteric adenitis   Time  coordinating discharge: <30 mins   Signed:  Emina Riebock, ANP-BC  Agree with above. Unsure of his diagnosis, but he got better.  Alphonsa Overall, MD, [facility] Surgery Pager: [phone] Office phone:  [phone]

## 2015-07-02 NOTE — Discharge Instructions (Signed)
Ciprofloxacin tablets What is this medicine? CIPROFLOXACIN (sip roe FLOX a sin) is a quinolone antibiotic. It is used to treat certain kinds of bacterial infections. It will not work for colds, flu, or other viral infections. This medicine may be used for other purposes; ask your health care provider or pharmacist if you have questions. What should I tell my health care provider before I take this medicine? They need to know if you have any of these conditions: -bone problems -cerebral disease -history of low levels of potassium in the blood -joint problems -irregular heartbeat -kidney disease -myasthenia gravis -seizures -tendon problems -tingling of the fingers or toes, or other nerve disorder -an unusual or allergic reaction to ciprofloxacin, other antibiotics or medicines, foods, dyes, or preservatives -pregnant or trying to get pregnant -breast-feeding How should I use this medicine? Take this medicine by mouth with a glass of water. Follow the directions on the prescription label. Take your medicine at regular intervals. Do not take your medicine more often than directed. Take all of your medicine as directed even if you think your are better. Do not skip doses or stop your medicine early. You can take this medicine with food or on an empty stomach. It can be taken with a meal that contains dairy or calcium, but do not take it alone with a dairy product, like milk or yogurt or calcium-fortified juice. A special MedGuide will be given to you by the pharmacist with each prescription and refill. Be sure to read this information carefully each time. Talk to your pediatrician regarding the use of this medicine in children. Special care may be needed. Overdosage: If you think you have taken too much of this medicine contact a poison control center or emergency room at once. NOTE: This medicine is only for you. Do not share this medicine with others. What if I miss a dose? If you miss a  dose, take it as soon as you can. If it is almost time for your next dose, take only that dose. Do not take double or extra doses. What may interact with this medicine? Do not take this medicine with any of the following medications: -cisapride -droperidol -terfenadine -tizanidine This medicine may also interact with the following medications: -antacids -birth control pills -caffeine -cyclosporin -didanosine (ddI) buffered tablets or powder -medicines for diabetes -medicines for inflammation like ibuprofen, naproxen -methotrexate -multivitamins -omeprazole -phenytoin -probenecid -sucralfate -theophylline -warfarin This list may not describe all possible interactions. Give your health care provider a list of all the medicines, herbs, non-prescription drugs, or dietary supplements you use. Also tell them if you smoke, drink alcohol, or use illegal drugs. Some items may interact with your medicine. What should I watch for while using this medicine? Tell your doctor or health care professional if your symptoms do not improve. Do not treat diarrhea with over the counter products. Contact your doctor if you have diarrhea that lasts more than 2 days or if it is severe and watery. You may get drowsy or dizzy. Do not drive, use machinery, or do anything that needs mental alertness until you know how this medicine affects you. Do not stand or sit up quickly, especially if you are an older patient. This reduces the risk of dizzy or fainting spells. This medicine can make you more sensitive to the sun. Keep out of the sun. If you cannot avoid being in the sun, wear protective clothing and use sunscreen. Do not use sun lamps or tanning beds/booths. Avoid antacids, aluminum,  calcium, iron, magnesium, and zinc products for 6 hours before and 2 hours after taking a dose of this medicine. What side effects may I notice from receiving this medicine? Side effects that you should report to your doctor or  health care professional as soon as possible: -allergic reactions like skin rash or hives, swelling of the face, lips, or tongue -anxious -confusion -depressed mood -diarrhea -fast, irregular heartbeat -hallucination, loss of contact with reality -joint, muscle, or tendon pain or swelling -pain, tingling, numbness in the hands or feet -suicidal thoughts or other mood changes -sunburn -unusually weak or tired Side effects that usually do not require medical attention (report to your doctor or health care professional if they continue or are bothersome): -dry mouth -headache -nausea -trouble sleeping This list may not describe all possible side effects. Call your doctor for medical advice about side effects. You may report side effects to FDA at 1-800-FDA-1088. Where should I keep my medicine? Keep out of the reach of children. Store at room temperature below 30 degrees C (86 degrees F). Keep container tightly closed. Throw away any unused medicine after the expiration date. NOTE: This sheet is a summary. It may not cover all possible information. If you have questions about this medicine, talk to your doctor, pharmacist, or health care provider.    2016, Elsevier/Gold Standard. (2014-12-17 12:57:02) Metronidazole extended-release tablets What is this medicine? METRONIDAZOLE (me troe NI da zole) is an antiinfective. It is used to treat certain kinds of bacterial and protozoal infections. It will not work for colds, flu, or other viral infections. This medicine may be used for other purposes; ask your health care provider or pharmacist if you have questions. What should I tell my health care provider before I take this medicine? They need to know if you have any of these conditions: -anemia or other blood disorders -disease of the nervous system -fungal or yeast infection -if you drink alcohol containing drinks -liver disease -seizures -an unusual or allergic reaction to  metronidazole, or other medicines, foods, dyes, or preservatives -pregnant or trying to get pregnant -breast-feeding How should I use this medicine? Take this medicine by mouth with a full glass of water. Follow the directions on the prescription label. Do not crush or chew. Take this medicine on an empty stomach 1 hour before or 2 hours after meals or food. Take your medicine at regular intervals. Do not take your medicine more often than directed. Take all of your medicine as directed even if you think you are better. Do not skip doses or stop your medicine early. Talk to your pediatrician regarding the use of this medicine in children. Special care may be needed. Overdosage: If you think you have taken too much of this medicine contact a poison control center or emergency room at once. NOTE: This medicine is only for you. Do not share this medicine with others. What if I miss a dose? If you miss a dose, take it as soon as you can. If it is almost time for your next dose, take only that dose. Do not take double or extra doses. What may interact with this medicine? Do not take this medicine with any of the following medications: -alcohol or any product that contains alcohol -amprenavir oral solution -cisapride -disulfiram -dofetilide -dronedarone -paclitaxel injection -pimozide -ritonavir oral solution -sertraline oral solution -sulfamethoxazole-trimethoprim injection -thioridazine -ziprasidone This medicine may also interact with the following medications: -birth control pills -cimetidine -lithium -other medicines that prolong the QT interval (cause  an abnormal heart rhythm) -phenobarbital -phenytoin -warfarin This list may not describe all possible interactions. Give your health care provider a list of all the medicines, herbs, non-prescription drugs, or dietary supplements you use. Also tell them if you smoke, drink alcohol, or use illegal drugs. Some items may interact with your  medicine. What should I watch for while using this medicine? Tell your doctor or health care professional if your symptoms do not improve or if they get worse. You may get drowsy or dizzy. Do not drive, use machinery, or do anything that needs mental alertness until you know how this medicine affects you. Do not stand or sit up quickly, especially if you are an older patient. This reduces the risk of dizzy or fainting spells. Avoid alcoholic drinks while you are taking this medicine and for three days afterward. Alcohol may make you feel dizzy, sick, or flushed. If you are being treated for a sexually transmitted disease, avoid sexual contact until you have finished your treatment. Your sexual partner may also need treatment. What side effects may I notice from receiving this medicine? Side effects that you should report to your doctor or health care professional as soon as possible: -allergic reactions like skin rash, itching or hives, swelling of the face, lips, or tongue -confusion, clumsiness -difficulty speaking -discolored or sore mouth -dizziness -fever, infection -numbness, tingling, pain or weakness in the hands or feet -trouble passing urine or change in the amount of urine -redness, blistering, peeling or loosening of the skin, including inside the mouth -seizures -unusually weak or tired -vaginal irritation, dryness, or discharge Side effects that usually do not require medical attention (report to your doctor or health care professional if they continue or are bothersome): -diarrhea -headache -irritability -metallic taste -nausea -stomach pain or cramps -trouble sleeping This list may not describe all possible side effects. Call your doctor for medical advice about side effects. You may report side effects to FDA at 1-800-FDA-1088. Where should I keep my medicine? Keep out of the reach of children. Store at room temperature between 15 and 30 degrees C (59 and 86 degrees  F). Protect from light and moisture. Keep container tightly closed. Throw away any unused medicine after the expiration date. NOTE: This sheet is a summary. It may not cover all possible information. If you have questions about this medicine, talk to your doctor, pharmacist, or health care provider.    2016, Elsevier/Gold Standard. (2012-12-13 14:05:10)

## 2015-07-02 NOTE — Progress Notes (Signed)
Discharge instructions reviewed with patient and wife, questions answered, verbalized understanding.  Patient given prescriptions for Cipro and Flagyl as well as copy of CT report.  Patient transported via wheelchair to front of hospital accompanied by nurse tech to be taken home by wife.

## 2016-03-03 LAB — CBC AND DIFFERENTIAL
HCT: 41 % (ref 41–53)
Hemoglobin: 13.5 g/dL (ref 13.5–17.5)
Platelets: 289 10*3/uL (ref 150–399)
WBC: 6.2 10^3/mL

## 2016-03-03 LAB — BASIC METABOLIC PANEL
BUN: 18 mg/dL (ref 4–21)
Creatinine: 1.1 mg/dL (ref 0.6–1.3)
Glucose: 88 mg/dL
Potassium: 4.4 mmol/L (ref 3.4–5.3)
Sodium: 138 mmol/L (ref 137–147)

## 2016-03-03 LAB — HEPATIC FUNCTION PANEL
ALT: 24 U/L (ref 10–40)
AST: 27 U/L (ref 14–40)
Alkaline Phosphatase: 60 U/L (ref 25–125)
Bilirubin, Total: 0.4 mg/dL

## 2016-03-15 ENCOUNTER — Encounter: Payer: Self-pay | Admitting: *Deleted

## 2016-03-15 DIAGNOSIS — M47812 Spondylosis without myelopathy or radiculopathy, cervical region: Secondary | ICD-10-CM | POA: Insufficient documentation

## 2016-03-15 DIAGNOSIS — M19041 Primary osteoarthritis, right hand: Secondary | ICD-10-CM | POA: Insufficient documentation

## 2016-03-15 DIAGNOSIS — M19042 Primary osteoarthritis, left hand: Secondary | ICD-10-CM

## 2016-03-15 DIAGNOSIS — E669 Obesity, unspecified: Secondary | ICD-10-CM | POA: Insufficient documentation

## 2016-03-15 DIAGNOSIS — C434 Malignant melanoma of scalp and neck: Secondary | ICD-10-CM

## 2016-03-15 DIAGNOSIS — I454 Nonspecific intraventricular block: Secondary | ICD-10-CM

## 2016-03-15 DIAGNOSIS — M069 Rheumatoid arthritis, unspecified: Secondary | ICD-10-CM

## 2016-03-15 DIAGNOSIS — M47816 Spondylosis without myelopathy or radiculopathy, lumbar region: Secondary | ICD-10-CM | POA: Insufficient documentation

## 2016-03-15 DIAGNOSIS — M792 Neuralgia and neuritis, unspecified: Secondary | ICD-10-CM

## 2016-03-15 DIAGNOSIS — E7800 Pure hypercholesterolemia, unspecified: Secondary | ICD-10-CM | POA: Insufficient documentation

## 2016-03-15 DIAGNOSIS — M5136 Other intervertebral disc degeneration, lumbar region: Secondary | ICD-10-CM

## 2016-03-15 DIAGNOSIS — I10 Essential (primary) hypertension: Secondary | ICD-10-CM

## 2016-03-15 DIAGNOSIS — Z79899 Other long term (current) drug therapy: Secondary | ICD-10-CM | POA: Insufficient documentation

## 2016-03-15 DIAGNOSIS — M170 Bilateral primary osteoarthritis of knee: Secondary | ICD-10-CM | POA: Insufficient documentation

## 2016-03-15 DIAGNOSIS — M5030 Other cervical disc degeneration, unspecified cervical region: Secondary | ICD-10-CM

## 2016-03-15 DIAGNOSIS — M51369 Other intervertebral disc degeneration, lumbar region without mention of lumbar back pain or lower extremity pain: Secondary | ICD-10-CM | POA: Insufficient documentation

## 2016-03-15 HISTORY — DX: Malignant melanoma of scalp and neck: C43.4

## 2016-03-15 HISTORY — DX: Essential (primary) hypertension: I10

## 2016-03-15 HISTORY — DX: Other intervertebral disc degeneration, lumbar region: M51.36

## 2016-03-15 HISTORY — DX: Pure hypercholesterolemia, unspecified: E78.00

## 2016-03-15 HISTORY — DX: Nonspecific intraventricular block: I45.4

## 2016-03-15 HISTORY — DX: Rheumatoid arthritis, unspecified: M06.9

## 2016-03-15 HISTORY — DX: Neuralgia and neuritis, unspecified: M79.2

## 2016-03-15 HISTORY — DX: Other intervertebral disc degeneration, lumbar region without mention of lumbar back pain or lower extremity pain: M51.369

## 2016-03-15 NOTE — Progress Notes (Signed)
*IMAGE* Office Visit Note  Patient: David [name]             Date of Birth: 12-09-1950           MRN: [id number]             PCP: Orpah Melter, MD Referring: Orpah Melter, MD Visit Date: 03/16/2016    Subjective:  Follow-up   History of Present Illness: David [name] is a 65 y.o. male with seronegative rheumatoid arthritis. He complains of some pain in his bilateral MCP joints and intermittent swelling. More so in the right hand than the left hand. His left trochanteric bursitis is better after the cortisone injection , he still has some intermittent pain. Right lateral epicondylitis is improved after the injection as well. He denies much discomfort in his bilateral total knee replacement. C-spine and lumbar spine pain has been tolerable.  Activities of Daily Living:  Patient reports morning stiffness for 2 hours.   Patient Reports nocturnal pain.  Difficulty dressing/grooming: Denies Difficulty climbing stairs: Denies Difficulty getting out of chair: Denies Difficulty using hands for taps, buttons, cutlery, and/or writing: Reports   Review of Systems  Constitutional: Positive for fatigue, weight gain and weakness.  HENT: Positive for mouth sores and mouth dryness.   Eyes: Positive for dryness.  Respiratory: Negative.   Cardiovascular: Negative.   Gastrointestinal: Positive for constipation.  Endocrine: Negative.   Genitourinary: Negative.   Musculoskeletal: Positive for arthralgias, joint pain, joint swelling and morning stiffness.  Skin: Negative.   Allergic/Immunologic: Negative.   Neurological: Positive for memory loss.  Hematological: Negative for swollen glands.  Psychiatric/Behavioral: Negative.     PMFS History:  Patient Active Problem List   Diagnosis Date Noted  . Rheumatoid arthritis ([place]) 03/15/2016  . DJD (degenerative joint disease), cervical 03/15/2016  . DDD (degenerative disc disease), lumbar 03/15/2016  . HTN (hypertension) 03/15/2016  .  Obesity 03/15/2016  . Elevated cholesterol 03/15/2016  . Neuralgia 03/15/2016  . Malignant melanoma of right side of neck ([place]) 03/15/2016  . BBB (bundle branch block) 03/15/2016  . High risk medication use 03/15/2016  . Osteoarthritis of lumbar spine 03/15/2016  . Primary osteoarthritis of both hands 03/15/2016  . Primary osteoarthritis of both knees 03/15/2016  . Mesenteric adenitis 06/29/2015    Past Medical History:  Diagnosis Date  . Arthritis   . BBB (bundle branch block) 03/15/2016  . Carpal tunnel syndrome   . DDD (degenerative disc disease), lumbar 03/15/2016  . Depression   . Dysrhythmia    had some tachycardia with steroids  . Elevated cholesterol 03/15/2016  . GERD (gastroesophageal reflux disease)    no meds since gastric banding  . Headache(784.0)    Ocular Migraines - takes Ambien for it  . HTN (hypertension) 03/15/2016  . Hyperlipemia   . Hypertension   . Malignant melanoma of right side of neck ([place]) 03/15/2016   30 years ago   . Neuralgia 03/15/2016  . Rheumatoid arthritis ([place]) 03/15/2016   Sero Negative.   Marland Kitchen RLS (restless legs syndrome)   . Shortness of breath    DOE  . Sleep apnea    could not use a cpap  . Snores   . Status post gastric banding   . Wears glasses    driving    Family History  Problem Relation Age of Onset  . Heart disease Mother   . Alzheimer's disease Father    Past Surgical History:  Procedure Laterality Date  . CARPAL TUNNEL RELEASE  Right 09/26/2012   Procedure: CARPAL TUNNEL RELEASE;  Surgeon: Tennis Must, MD;  Location: [facility];  Service: Orthopedics;  Laterality: Right;  . CARPAL TUNNEL RELEASE Left 10/24/2012   Procedure: CARPAL TUNNEL RELEASE;  Surgeon: Tennis Must, MD;  Location: [facility];  Service: Orthopedics;  Laterality: Left;  . EXCISION MELANOMA WITH SENTINEL LYMPH NODE BIOPSY  1984   rt neck with mulpipal nodes and some muscle excised rt neck-shoulder  . knee replacement     . LAPAROSCOPIC GASTRIC BANDING  03/15/2009  . LAPAROSCOPIC GASTRIC BANDING    . MUSCLE BIOPSY  2004   rt thigh to r/o myocitis  . TONSILLECTOMY    . TOTAL KNEE ARTHROPLASTY Bilateral 12/05/2010  . TRIGGER FINGER RELEASE Right 09/26/2012   Procedure: RELEASE TRIGGER FINGER/A-1 PULLEY RING AND SMALL ;  Surgeon: Tennis Must, MD;  Location: [facility];  Service: Orthopedics;  Laterality: Right;  . TRIGGER FINGER RELEASE Left 10/24/2012   Procedure: RELEASE TRIGGER FINGER/A-1 PULLEY LEFT MIDDLE AND LEFT RING;  Surgeon: Tennis Must, MD;  Location: [facility];  Service: Orthopedics;  Laterality: Left;   Social History   Social History Narrative  . No narrative on file     Objective: Vital Signs: BP 129/75 (BP Location: Left Arm, Patient Position: Sitting, Cuff Size: Large)   Pulse 91   Resp 14   Ht 5\' 8"  (1.727 m)   Wt 244 lb (110.7 kg)   BMI 37.10 kg/m    Physical Exam  Constitutional: He is oriented to person, place, and time. He appears well-developed and well-nourished.  HENT:  Head: Normocephalic.  Eyes: Conjunctivae and EOM are normal. Pupils are equal, round, and reactive to light.  Neck: Normal range of motion. No thyromegaly present.  Cardiovascular: Normal rate, regular rhythm, normal heart sounds and intact distal pulses.   Pulmonary/Chest: Effort normal and breath sounds normal.  Abdominal: Soft. Bowel sounds are normal.  Lymphadenopathy:    He has no cervical adenopathy.  Neurological: He is alert and oriented to person, place, and time.  Skin: Skin is warm and dry. Capillary refill takes more than 3 seconds. Rash noted.  Psychiatric: He has a normal mood and affect. His behavior is normal.    He had macular rash on the right side of his neck in right shoulder. Musculoskeletal Exam: C-spine and thoracic lumbar spine was good range of motion. Right shoulder joint range of motion was limited to 140 but not painful. Left shoulder joint is  full range of motion. Bilateral elbow joints with good range of motion. No tenderness over the right epicondyle area was noted. He had some limitation with range of motion of his wrist joints. No synovitis was noted over her MCP joints. He had thickening of bilateral PIP DIP CMC joints consistent with osteoarthritis. Bilateral knee joints are replaced without any warmth swelling or effusion. He had good range of motion of his bilateral hip joints and ankle joints MTPs and PIP joints. No synovitis in feet is noted.  CDAI Exam: CDAI Homunculus Exam:   Joint Counts:  CDAI Tender Joint count: 0 CDAI Swollen Joint count: 0  Global Assessments:  Patient Global Assessment: 5 Provider Global Assessment: 3  CDAI Calculated Score: 8    Investigation: Findings:  Labs 03/03/2016 comprehensive metabolic panel normal CBC normal TB Gold 09/2015 was neg, hepatitis panel 03/2013 was negative, April 2015 G6PD was normal, HIV-negative, immunoglobulins normal    Imaging: No  results found.  Speciality Comments: No specialty comments available.    Procedures:  No procedures performed Allergies: Codeine; Dilaudid [hydromorphone hcl]; Fentanyl; Nucynta [tapentadol]; and Penicillins   Assessment / Plan: Visit Diagnoses: Rheumatoid arthritis of multiple sites with negative rheumatoid factor ([place]): He complains of pain in his bilateral MCP joints but no synovitis was noted on examination today. He is tolerating his medications well. He is unable to take more aggressive therapy due to history of melanoma.  High risk medication use - he is on methotrexate 25 mg subcutaneously per week, sulfasalazine 1 g by mouth twice a day, Plaquenil 200 mg twice a day. His labs have been stable and he's been getting his eye exams. We'll schedule standing orders every 3 months. Plan: CBC with Differential/Platelet, COMPLETE METABOLIC PANEL WITH GFR  DJD (degenerative joint disease), cervical: Some stiffness  present.  Osteoarthritis of lumbar spine: He reports minimal discomfort.,   Primary osteoarthritis of both hands: he has some ongoing pain and stiffness joint protection and muscle strengthening was discussed.  Primary osteoarthritis of both knees - BTKR: He does not have much discomfort in his knees.  Trochanteric bursitis of left hip.: Has improved since the cortisone injection he will continue to do exercises.  Lateral epicondylitis, right elbow has improved after the injection.  Rash: He has rash on the right side of his neck and right shoulder. He is seeing dermatologist for that and receiving some treatment. He believes it could be due to possible spider bite.  Malignant melanoma of right side of neck ([place]): Followed up by dermatologist.  Obesity due to excess calories, unspecified classification, unspecified whether serious comorbidity present   Association of heart disease with rheumatoid arthritis was discussed. Need to monitor blood pressure, cholesterol, and to exercise 30-60 minutes on daily basis was discussed. Poor dental hygiene can be a predisposing factor for rheumatoid arthritis. Good dental hygiene was discussed.   Follow-Up Instructions: Return in about 5 months (around 08/14/2016) for Rheumatoid arthritis.  Orders: Orders Placed This Encounter  Procedures  . CBC with Differential/Platelet  . COMPLETE METABOLIC PANEL WITH GFR    Bo Merino, MD

## 2016-03-16 ENCOUNTER — Ambulatory Visit (INDEPENDENT_AMBULATORY_CARE_PROVIDER_SITE_OTHER): Payer: 59 | Admitting: Rheumatology

## 2016-03-16 ENCOUNTER — Encounter: Payer: Self-pay | Admitting: Rheumatology

## 2016-03-16 VITALS — BP 129/75 | HR 91 | Resp 14 | Ht 68.0 in | Wt 244.0 lb

## 2016-03-16 DIAGNOSIS — M5030 Other cervical disc degeneration, unspecified cervical region: Secondary | ICD-10-CM

## 2016-03-16 DIAGNOSIS — E6609 Other obesity due to excess calories: Secondary | ICD-10-CM | POA: Diagnosis not present

## 2016-03-16 DIAGNOSIS — M19042 Primary osteoarthritis, left hand: Secondary | ICD-10-CM | POA: Diagnosis not present

## 2016-03-16 DIAGNOSIS — Z79899 Other long term (current) drug therapy: Secondary | ICD-10-CM | POA: Diagnosis not present

## 2016-03-16 DIAGNOSIS — M47816 Spondylosis without myelopathy or radiculopathy, lumbar region: Secondary | ICD-10-CM | POA: Diagnosis not present

## 2016-03-16 DIAGNOSIS — C434 Malignant melanoma of scalp and neck: Secondary | ICD-10-CM | POA: Diagnosis not present

## 2016-03-16 DIAGNOSIS — M7062 Trochanteric bursitis, left hip: Secondary | ICD-10-CM | POA: Diagnosis not present

## 2016-03-16 DIAGNOSIS — M19041 Primary osteoarthritis, right hand: Secondary | ICD-10-CM

## 2016-03-16 DIAGNOSIS — M170 Bilateral primary osteoarthritis of knee: Secondary | ICD-10-CM | POA: Diagnosis not present

## 2016-03-16 DIAGNOSIS — M0609 Rheumatoid arthritis without rheumatoid factor, multiple sites: Secondary | ICD-10-CM

## 2016-03-16 DIAGNOSIS — M7711 Lateral epicondylitis, right elbow: Secondary | ICD-10-CM

## 2016-03-16 DIAGNOSIS — M47812 Spondylosis without myelopathy or radiculopathy, cervical region: Secondary | ICD-10-CM

## 2016-03-16 NOTE — Patient Instructions (Signed)
Standing Labs We placed an order today for your standing lab work.    Please come back and get your standing labs in January  We have open lab Monday through Friday from 8:30-11:30 AM and 1-4 PM at the office of Dr. Tresa Moore, PA.   The office is located at [address] [place], [place], [place] [address] No appointment is necessary.   Labs are drawn by [facility].  You may receive a bill from [facility] for your lab work.

## 2016-03-16 NOTE — Progress Notes (Signed)
Pharmacy Note  Subjective:   Patient presents today to the [facility] to see Dr. Estanislado Pandy.  Patient reports he may be without insurance for a few months before he is eligible for Medicare in July 2018, and he is concerned about the cost of his medications.  Patient has rheumatoid arthritis and is currently prescribed methotrexate injections using prefilled syringe (Rasuvo), sulfasalazine, and hydroxychloroquine.  Patient seen by pharmacist for discussion of medication cost.    Assessment/Plan:  1.  Methotrexate:  Patient is currently using Rasuvo prefilled syringes.  Discussed switching to methotrexate vial and syringe to reduce cost.  Patient is agreeable to switch in the future if needed to reduce medication cost.  Educated patient on how to use a vial and syringe and reviewed injection technique with patient.  Patient confirms he gives his wife injections of B12 at this time and feels very comfortable drawing medication out of vials.  Counseled patient on difference with B12 injections (intramuscular) and methotrexate injections (subcutaneous).  Patient voiced understanding.  Provided patient on educational material regarding injection technique and storage of methotrexate.  Advised patient to call us in the future if he wants [place] to switch his Rasuvo prescription to methotrexate vials and syringes.   2.  Hydroxychloroquine/sulfasalazine:  Provided patient with GoodRx coupon card and looked up medication cost with GoodRx.  Patient reports he would be able to afford medication cost with GoodRx.  Elisabeth Most, Pharm.D., BCPS Clinical Pharmacist Pager: [phone] Phone: [phone] 03/16/2016 12:30 PM

## 2016-03-26 ENCOUNTER — Other Ambulatory Visit: Payer: Self-pay | Admitting: Rheumatology

## 2016-03-27 NOTE — Telephone Encounter (Signed)
Last visit 10/15/15 Next visit 08/15/15 Labs  03/06/16 Ok to refill per Dr Estanislado Pandy

## 2016-05-10 ENCOUNTER — Other Ambulatory Visit: Payer: Self-pay | Admitting: Rheumatology

## 2016-05-10 NOTE — Telephone Encounter (Signed)
Last Visit: 03/16/16 Next Visit: 08/14/16 Labs: 03/06/16  Okay to refill SSZ?

## 2016-05-10 NOTE — Telephone Encounter (Signed)
ok 

## 2016-06-26 ENCOUNTER — Other Ambulatory Visit: Payer: Self-pay | Admitting: Rheumatology

## 2016-06-26 NOTE — Telephone Encounter (Signed)
Last Visit: 03/16/16 Next Visit: 08/14/16 Labs: 03/03/16 WNL  Okay to refill SSZ?

## 2016-07-31 ENCOUNTER — Other Ambulatory Visit: Payer: Self-pay | Admitting: Rheumatology

## 2016-07-31 NOTE — Telephone Encounter (Signed)
Last visit 03/16/16 Next visit 08/14/16 Labs past due last done in October  Called patient left message for him to call back and let me know when he can go for the labs

## 2016-08-04 NOTE — Telephone Encounter (Signed)
Called patient left message for him to call back and let me know when he can go for the labs

## 2016-08-04 NOTE — Telephone Encounter (Signed)
Can not refill till labs are done

## 2016-08-04 NOTE — Telephone Encounter (Signed)
Last visit 03/16/16 Next visit 08/14/16 Labs past due last done in October  Multiple attempts made to contact patient regarding labs. He has not returned call.   Okay to refill 30 supply SSZ?

## 2016-08-07 NOTE — Progress Notes (Signed)
Office Visit Note  Patient: David [name]             Date of Birth: 11-26-1950           MRN: [id number]             PCP: Orpah Melter, MD Referring: Orpah Melter, MD Visit Date: 08/14/2016 Occupation: _0 @    Subjective:  Follow-up  History of Present Illness: David [name] is a 66 y.o. male  03/16/2016 last visit   hx of Malignant melanoma of right side of neck ([place]): Followed up by dermatologist. On methotrexate 25 mg subcutaneously per week, sulfasalazine 1 g by mouth twice a day, Plaquenil 200 mg twice a day.and doing well.  Pt would like to see if he can decrease one of his medication (up to [place] which medication we decreased but patient wants to explore that idea)  Hx of FMS.   Activities of Daily Living:  Patient reports morning stiffness for 30 minutes.   Patient Denies nocturnal pain.  Difficulty dressing/grooming: Denies Difficulty climbing stairs: Denies Difficulty getting out of chair: Denies Difficulty using hands for taps, buttons, cutlery, and/or writing: Denies   Review of Systems  Constitutional: Negative for fatigue.  HENT: Negative for mouth sores and mouth dryness.   Eyes: Negative for dryness.  Respiratory: Negative for shortness of breath.   Gastrointestinal: Negative for constipation and diarrhea.  Musculoskeletal: Negative for myalgias and myalgias.  Skin: Negative for sensitivity to sunlight.  Neurological: Negative for memory loss.  Psychiatric/Behavioral: Negative for sleep disturbance.    PMFS History:  Patient Active Problem List   Diagnosis Date Noted  . Rheumatoid arthritis ([place]) 03/15/2016  . DJD (degenerative joint disease), cervical 03/15/2016  . DDD (degenerative disc disease), lumbar 03/15/2016  . HTN (hypertension) 03/15/2016  . Obesity 03/15/2016  . Elevated cholesterol 03/15/2016  . Neuralgia 03/15/2016  . Malignant melanoma of right side of neck ([place]) 03/15/2016  . BBB (bundle branch block) 03/15/2016  .  High risk medication use 03/15/2016  . Osteoarthritis of lumbar spine 03/15/2016  . Primary osteoarthritis of both hands 03/15/2016  . Primary osteoarthritis of both knees 03/15/2016  . Mesenteric adenitis 06/29/2015    Past Medical History:  Diagnosis Date  . Arthritis   . BBB (bundle branch block) 03/15/2016  . Carpal tunnel syndrome   . DDD (degenerative disc disease), lumbar 03/15/2016  . Depression   . Dysrhythmia    had some tachycardia with steroids  . Elevated cholesterol 03/15/2016  . GERD (gastroesophageal reflux disease)    no meds since gastric banding  . Headache(784.0)    Ocular Migraines - takes Ambien for it  . HTN (hypertension) 03/15/2016  . Hyperlipemia   . Hypertension   . Malignant melanoma of right side of neck ([place]) 03/15/2016   30 years ago   . Neuralgia 03/15/2016  . Rheumatoid arthritis ([place]) 03/15/2016   Sero Negative.   Marland Kitchen RLS (restless legs syndrome)   . Shortness of breath    DOE  . Sleep apnea    could not use a cpap  . Snores   . Status post gastric banding   . Wears glasses    driving    Family History  Problem Relation Age of Onset  . Heart disease Mother   . Alzheimer's disease Father    Past Surgical History:  Procedure Laterality Date  . CARPAL TUNNEL RELEASE Right 09/26/2012   Procedure: CARPAL TUNNEL RELEASE;  Surgeon: Tennis Must, MD;  Location: [facility];  Service: Orthopedics;  Laterality: Right;  . CARPAL TUNNEL RELEASE Left 10/24/2012   Procedure: CARPAL TUNNEL RELEASE;  Surgeon: Tennis Must, MD;  Location: [facility];  Service: Orthopedics;  Laterality: Left;  . EXCISION MELANOMA WITH SENTINEL LYMPH NODE BIOPSY  1984   rt neck with mulpipal nodes and some muscle excised rt neck-shoulder  . knee replacement    . LAPAROSCOPIC GASTRIC BANDING  03/15/2009  . LAPAROSCOPIC GASTRIC BANDING    . MUSCLE BIOPSY  2004   rt thigh to r/o myocitis  . TONSILLECTOMY    . TOTAL KNEE ARTHROPLASTY  Bilateral 12/05/2010  . TRIGGER FINGER RELEASE Right 09/26/2012   Procedure: RELEASE TRIGGER FINGER/A-1 PULLEY RING AND SMALL ;  Surgeon: Tennis Must, MD;  Location: [facility];  Service: Orthopedics;  Laterality: Right;  . TRIGGER FINGER RELEASE Left 10/24/2012   Procedure: RELEASE TRIGGER FINGER/A-1 PULLEY LEFT MIDDLE AND LEFT RING;  Surgeon: Tennis Must, MD;  Location: [facility];  Service: Orthopedics;  Laterality: Left;   Social History   Social History Narrative  . No narrative on file     Objective: Vital Signs: BP (!) 163/98   Pulse 79   Resp 13   Ht _0  (1.727 m)   Wt 257 lb (116.6 kg)   BMI 39.08 kg/m    Physical Exam  Constitutional: He is oriented to person, place, and time. He appears well-developed and well-nourished.  HENT:  Head: Normocephalic and atraumatic.  Eyes: Conjunctivae and EOM are normal. Pupils are equal, round, and reactive to light.  Neck: Normal range of motion. Neck supple.  Cardiovascular: Normal rate, regular rhythm and normal heart sounds.  Exam reveals no gallop and no friction rub.   No murmur heard. Pulmonary/Chest: Effort normal and breath sounds normal. No respiratory distress. He has no wheezes. He has no rales. He exhibits no tenderness.  Abdominal: Soft. He exhibits no distension and no mass. There is no tenderness. There is no guarding.  Musculoskeletal: Normal range of motion.  Lymphadenopathy:    He has no cervical adenopathy.  Neurological: He is alert and oriented to person, place, and time. He exhibits normal muscle tone. Coordination normal.  Skin: Skin is warm and dry. Capillary refill takes less than 2 seconds. No rash noted.  Psychiatric: He has a normal mood and affect. His behavior is normal. Judgment and thought content normal.  Vitals reviewed.    Musculoskeletal Exam:  Full range of motion of all joints Grip strength is equal and strong bilaterally Fibromyalgia tender points are all  absent  CDAI Exam: CDAI Homunculus Exam:   Joint Counts:  CDAI Tender Joint count: 0 CDAI Swollen Joint count: 0  Global Assessments:  Patient Global Assessment: 0 Provider Global Assessment: 0    Investigation: Findings:  Labs 03/03/2016 comprehensive metabolic panel normal CBC normal TB Gold 09/2015 was neg, hepatitis panel 03/2013 was negative, April 2015 G6PD was normal, HIV-negative, immunoglobulins normal  08/13/2013  Per EULAR recommendations, ultrasound examination of bilateral hands was performed using a 12 MHz transducer, power Doppler, and gray scale to look for synovitis and tenosynovitis.  Bilateral 2nd, 3rd, and 5th MCP joints and bilateral wrist joints, both dorsal and volar aspects, were evaluated.  The findings were:  There was synovitis in bilateral 2nd MCP joints, bilateral 3rd MCP joints, and bilateral 5th MCP joints.  Also synovitis in bilateral wrist joints.  The right median nerve was 0.1  cm square which was more than upper limits of normal, and the left median nerve was 0.14 cm square which was also upper limits of normal.  There was also tenosynovitis in his right wrist joint.  These findings are consistent with inflammatory arthritis like rheumatoid arthritis.   PLAN:  He will be coming back for a followup visit to discuss the results and determine the future treatment plan  04/02/2013 X-rays bilateral hands in the office today show bilateral  PIP, DIP narrowing, no MCP narrowing, no intercarpal or radiocarpal joint space narrowing was noted.     He has history of elevated CK and had neurological and rheumatological workups in the past several years ago including a muscle biopsy which according to the patient was negative.    Labs from April 02, 2013 shows CBC with diff is normal, CMP is normal, hepatitis panel is negative, sed rate is normal, TSH is normal, uric acid is normal at 6.3, CK is elevated at 444.  We tried to add aldolase on last labs, but we were  not able to.  We will do that today on today's repeat labs.  Rheumatoid factor is 15, ANA is negative, SPEP M-spike is negative, CCP is negative, HLA-B27 is negative, vitamin D is normal at 47.   No visits with results within 3 Month(s) from this visit.  Latest known visit with results is:  Abstract on 03/15/2016  Component Date Value Ref Range Status  . Hemoglobin 03/03/2016 13.5  13.5 - 17.5 g/dL Final  . HCT 03/03/2016 41  41 - 53 % Final  . Platelets 03/03/2016 289  150 - 399 K/L Final  . WBC 03/03/2016 6.2  10^3/mL Final  . Glucose 03/03/2016 88  mg/dL Final  . BUN 03/03/2016 18  4 - 21 mg/dL Final  . Creatinine 03/03/2016 1.1  0.6 - 1.3 mg/dL Final  . Potassium 03/03/2016 4.4  3.4 - 5.3 mmol/L Final  . Sodium 03/03/2016 138  137 - 147 mmol/L Final  . Alkaline Phosphatase 03/03/2016 60  25 - 125 U/L Final  . ALT 03/03/2016 24  10 - 40 U/L Final  . AST 03/03/2016 27  14 - 40 U/L Final  . Bilirubin, Total 03/03/2016 0.4  mg/dL Final    Imaging: No results found.  Speciality Comments: No specialty comments available.    Procedures:  No procedures performed Allergies: Codeine; Dilaudid [hydromorphone hcl]; Fentanyl; Nucynta [tapentadol]; and Penicillins   Assessment / Plan:     Visit Diagnoses: Rheumatoid arthritis of multiple sites with negative rheumatoid factor ([facility])  High risk medication use - Methtorexate PLQ and SSZ   Primary osteoarthritis of both knees  DJD (degenerative joint disease), cervical  DDD (degenerative disc disease), lumbar  Malignant melanoma of right side of neck ([facility])  History of hypertension  History of hyperlipidemia  Elevated CK -  history of elevated CK and had neurological and rheum workups in the past several years ago including a muscle biopsy which according to the patient was normal   Plan: #1: History of rheumatoid arthritis (seronegative). Has had ultrasound done in 2015 which showed synovitis of the second third and fifth MCP  joint bilaterally. History of melanoma on the right side of the neck and therefore not on any biologic at this time. Currently taking triple therapy of methotrexate, sulfasalazine, Plaquenil. Patient is doing really well with his joints. No joint pain swelling or stiffness. Patient is requesting a decrease in one of his medication if appropriate since he is  doing so well. We discussed the possibility of decreasing Plaquenil from 200 mg twice a day to 20 mg daily  #2: High risk prescription Methotrexate 25 mg subcutaneous once a week Folic acid 2 mg daily Sulfasalazine 1000 mg BID Plaquenil 20 mg twice a day  #3: CBC with differential and CMP with GFR due today  #4 patient was seen pain management doctor but while the pain management doctor was on vacation, patient's medication refill was due. When he tried to get the refill, no one in the doctor's office was able to refill the patient's medication and patient went under some withdrawal symptoms. However, patient is doing well without the morphine and does not need any more pain medication anymore.  #5: No fibromyalgia symptoms at this time     Orders: No orders of the defined types were placed in this encounter.  No orders of the defined types were placed in this encounter.   Face-to-face time spent with patient was 30 minutes. 50% of time was spent in counseling and coordination of care.  Follow-Up Instructions: No Follow-up on file.   Eliezer Lofts, PA-C  Patient has no synovitis on exam today. I examined and evaluated the patient with Eliezer Lofts PA. The plan of care was discussed as noted above.  Bo Merino, MD Note - This record has been created using Editor, commissioning.  Chart creation errors have been sought, but may not always  have been located. Such creation errors do not reflect on  the standard of medical care.

## 2016-08-14 ENCOUNTER — Encounter: Payer: Self-pay | Admitting: Rheumatology

## 2016-08-14 ENCOUNTER — Ambulatory Visit (INDEPENDENT_AMBULATORY_CARE_PROVIDER_SITE_OTHER): Payer: 59 | Admitting: Rheumatology

## 2016-08-14 VITALS — BP 163/98 | HR 79 | Resp 13 | Ht 68.0 in | Wt 257.0 lb

## 2016-08-14 DIAGNOSIS — R748 Abnormal levels of other serum enzymes: Secondary | ICD-10-CM

## 2016-08-14 DIAGNOSIS — Z79899 Other long term (current) drug therapy: Secondary | ICD-10-CM | POA: Diagnosis not present

## 2016-08-14 DIAGNOSIS — M5030 Other cervical disc degeneration, unspecified cervical region: Secondary | ICD-10-CM | POA: Diagnosis not present

## 2016-08-14 DIAGNOSIS — C434 Malignant melanoma of scalp and neck: Secondary | ICD-10-CM | POA: Diagnosis not present

## 2016-08-14 DIAGNOSIS — M170 Bilateral primary osteoarthritis of knee: Secondary | ICD-10-CM | POA: Diagnosis not present

## 2016-08-14 DIAGNOSIS — Z8639 Personal history of other endocrine, nutritional and metabolic disease: Secondary | ICD-10-CM

## 2016-08-14 DIAGNOSIS — Z8679 Personal history of other diseases of the circulatory system: Secondary | ICD-10-CM

## 2016-08-14 DIAGNOSIS — M5136 Other intervertebral disc degeneration, lumbar region: Secondary | ICD-10-CM | POA: Diagnosis not present

## 2016-08-14 DIAGNOSIS — M0609 Rheumatoid arthritis without rheumatoid factor, multiple sites: Secondary | ICD-10-CM | POA: Diagnosis not present

## 2016-08-14 DIAGNOSIS — M47812 Spondylosis without myelopathy or radiculopathy, cervical region: Secondary | ICD-10-CM

## 2016-08-14 LAB — CBC WITH DIFFERENTIAL/PLATELET
Basophils Absolute: 92 cells/uL (ref 0–200)
Basophils Relative: 1 %
Eosinophils Absolute: 184 cells/uL (ref 15–500)
Eosinophils Relative: 2 %
HCT: 48.7 % (ref 38.5–50.0)
Hemoglobin: 16 g/dL (ref 13.2–17.1)
Lymphocytes Relative: 26 %
Lymphs Abs: 2392 cells/uL (ref 850–3900)
MCH: 31.2 pg (ref 27.0–33.0)
MCHC: 32.9 g/dL (ref 32.0–36.0)
MCV: 94.9 fL (ref 80.0–100.0)
MPV: 9 fL (ref 7.5–12.5)
Monocytes Absolute: 1104 cells/uL — ABNORMAL HIGH (ref 200–950)
Monocytes Relative: 12 %
Neutro Abs: 5428 cells/uL (ref 1500–7800)
Neutrophils Relative %: 59 %
Platelets: 269 10*3/uL (ref 140–400)
RBC: 5.13 MIL/uL (ref 4.20–5.80)
RDW: 15.4 % — ABNORMAL HIGH (ref 11.0–15.0)
WBC: 9.2 10*3/uL (ref 3.8–10.8)

## 2016-08-14 LAB — COMPLETE METABOLIC PANEL WITH GFR
ALT: 33 U/L (ref 9–46)
AST: 36 U/L — ABNORMAL HIGH (ref 10–35)
Albumin: 3.9 g/dL (ref 3.6–5.1)
Alkaline Phosphatase: 72 U/L (ref 40–115)
BUN: 14 mg/dL (ref 7–25)
CO2: 28 mmol/L (ref 20–31)
Calcium: 10 mg/dL (ref 8.6–10.3)
Chloride: 108 mmol/L (ref 98–110)
Creat: 1.06 mg/dL (ref 0.70–1.25)
GFR, Est African American: 85 mL/min (ref >=60)
GFR, Est Non African American: 74 mL/min (ref >=60)
Glucose, Bld: 94 mg/dL (ref 65–99)
Potassium: 5.7 mmol/L — ABNORMAL HIGH (ref 3.5–5.3)
Sodium: 141 mmol/L (ref 135–146)
Total Bilirubin: 0.5 mg/dL (ref 0.2–1.2)
Total Protein: 6.7 g/dL (ref 6.1–8.1)

## 2016-08-15 ENCOUNTER — Other Ambulatory Visit: Payer: Self-pay | Admitting: Rheumatology

## 2016-08-15 NOTE — Telephone Encounter (Signed)
Last Visit: 08/14/16 Next Visit: 01/18/17 Labs: 08/14/16 Potassium 5.7  Okay to refill SSZ?

## 2016-08-15 NOTE — Telephone Encounter (Signed)
Asked patient to follow with PCP for his elevated potassium

## 2016-08-16 NOTE — Telephone Encounter (Signed)
It should say "OK" , not o peter

## 2016-08-16 NOTE — Telephone Encounter (Signed)
[facility] refill sulfasalazine500 mg enteric-coated tablet2 pills twice a day120 tablets with 2 refill please

## 2016-08-16 NOTE — Telephone Encounter (Signed)
Patient advised to discuss elevated Potassium with PCP. Patient verbalized understanding.

## 2016-09-11 ENCOUNTER — Other Ambulatory Visit: Payer: Self-pay | Admitting: Rheumatology

## 2016-09-11 NOTE — Telephone Encounter (Signed)
08/14/16 labs and office visit 01/18/17 next visit Ok to refill per Dr Estanislado Pandy

## 2016-09-25 ENCOUNTER — Other Ambulatory Visit: Payer: Self-pay | Admitting: Rheumatology

## 2016-09-25 ENCOUNTER — Other Ambulatory Visit: Payer: Self-pay | Admitting: Radiology

## 2016-09-25 ENCOUNTER — Other Ambulatory Visit: Payer: Self-pay

## 2016-09-25 DIAGNOSIS — Z79899 Other long term (current) drug therapy: Secondary | ICD-10-CM

## 2016-09-25 LAB — COMPLETE METABOLIC PANEL WITH GFR
ALT: 24 U/L (ref 9–46)
AST: 30 U/L (ref 10–35)
Albumin: 4.1 g/dL (ref 3.6–5.1)
Alkaline Phosphatase: 62 U/L (ref 40–115)
BUN: 16 mg/dL (ref 7–25)
CO2: 28 mmol/L (ref 20–31)
Calcium: 9.6 mg/dL (ref 8.6–10.3)
Chloride: 105 mmol/L (ref 98–110)
Creat: 1.14 mg/dL (ref 0.70–1.25)
GFR, Est African American: 78 mL/min (ref >=60)
GFR, Est Non African American: 68 mL/min (ref >=60)
Glucose, Bld: 95 mg/dL (ref 65–99)
Potassium: 4.5 mmol/L (ref 3.5–5.3)
Sodium: 142 mmol/L (ref 135–146)
Total Bilirubin: 0.5 mg/dL (ref 0.2–1.2)
Total Protein: 6.6 g/dL (ref 6.1–8.1)

## 2016-09-25 LAB — CBC WITH DIFFERENTIAL/PLATELET
Basophils Absolute: 95 cells/uL (ref 0–200)
Basophils Relative: 1 %
Eosinophils Absolute: 190 cells/uL (ref 15–500)
Eosinophils Relative: 2 %
HCT: 47.7 % (ref 38.5–50.0)
Hemoglobin: 16.1 g/dL (ref 13.2–17.1)
Lymphocytes Relative: 26 %
Lymphs Abs: 2470 cells/uL (ref 850–3900)
MCH: 32.1 pg (ref 27.0–33.0)
MCHC: 33.8 g/dL (ref 32.0–36.0)
MCV: 95 fL (ref 80.0–100.0)
MPV: 9 fL (ref 7.5–12.5)
Monocytes Absolute: 1140 cells/uL — ABNORMAL HIGH (ref 200–950)
Monocytes Relative: 12 %
Neutro Abs: 5605 cells/uL (ref 1500–7800)
Neutrophils Relative %: 59 %
Platelets: 287 10*3/uL (ref 140–400)
RBC: 5.02 MIL/uL (ref 4.20–5.80)
RDW: 15.6 % — ABNORMAL HIGH (ref 11.0–15.0)
WBC: 9.5 10*3/uL (ref 3.8–10.8)

## 2016-09-25 NOTE — Telephone Encounter (Signed)
Spoke with patient and he states that he has not seen his PCP. Patient will come by the office today to have a BMP drawn.

## 2016-09-25 NOTE — Telephone Encounter (Signed)
Last Visit: 08/14/16  Next Visit: 01/18/17  Labs: 08/14/16 Potassium 5.7  Okay to refill SSZ?

## 2016-09-25 NOTE — Telephone Encounter (Signed)
Please make sure that patient had repeat potassium done with his PCP. Okay to refill sulfasalazine.

## 2016-09-26 NOTE — Progress Notes (Signed)
WNL

## 2016-10-20 ENCOUNTER — Encounter (HOSPITAL_COMMUNITY): Payer: Self-pay

## 2016-10-30 ENCOUNTER — Other Ambulatory Visit: Payer: Self-pay | Admitting: Rheumatology

## 2016-10-30 NOTE — Telephone Encounter (Signed)
Last Visit: 08/14/16  Next Visit: 01/18/17  Labs: 09/25/16 WNL  Okay to refill SSZ?

## 2016-12-18 ENCOUNTER — Telehealth: Payer: Self-pay | Admitting: Rheumatology

## 2016-12-18 MED ORDER — SULFASALAZINE 500 MG PO TBEC: 1000.0000 mg | DELAYED_RELEASE_TABLET | Freq: Two times a day (BID) | ORAL | 0 refills | Status: DC

## 2016-12-18 NOTE — Telephone Encounter (Signed)
Patient went to pick up Rasuvo rx and it was over $400 and he cannot afford that. He is requesting MTX 25mg  injectables to be sent to [facility] on [address].  He is also requesting a refill of sulfasalazine to be sent to [facility] on [address] in [place].

## 2016-12-18 NOTE — Telephone Encounter (Signed)
Patient states that the Rasuvo costs $400 per month. Patient has recently went on Medicare and [employer] for supplemental. Patient states he would like to change to MTX vial and syringe. Patient states this will be more cost effective for him. Patient is coming to update labs this week.   Last Visit: 08/14/16  Next Visit: 01/18/17  Labs: 09/25/16 WNL  Okay to switch patient to MTX vial and syringe?

## 2016-12-18 NOTE — Telephone Encounter (Signed)
ok 

## 2016-12-18 NOTE — Telephone Encounter (Signed)
Last Visit: 08/14/16  Next Visit: 01/18/17  Labs: 09/25/16 WNL  Okay to refill SSZ per Dr Estanislado Pandy

## 2016-12-19 MED ORDER — "TUBERCULIN-ALLERGY SYRINGES 27G X 1/2"" 1 ML MISC": 3 refills | Status: DC

## 2016-12-19 MED ORDER — METHOTREXATE SODIUM CHEMO INJECTION 50 MG/2ML: 25.0000 mg | INTRAMUSCULAR | 0 refills | Status: DC

## 2016-12-19 MED ORDER — METHOTREXATE SODIUM CHEMO INJECTION 50 MG/2ML: 12.5000 mg | INTRAMUSCULAR | 0 refills | Status: DC

## 2016-12-19 MED ORDER — "TUBERCULIN-ALLERGY SYRINGES 27G X 1/2"" 0.5 ML MISC": 3 refills | Status: DC

## 2016-12-19 NOTE — Telephone Encounter (Signed)
Patient has experience with vial and syringe and declines teaching at this time. Prescription sent to the pharmacy for MTX vial and syringe.

## 2016-12-25 ENCOUNTER — Other Ambulatory Visit: Payer: Self-pay | Admitting: Rheumatology

## 2017-01-15 ENCOUNTER — Telehealth: Payer: Self-pay | Admitting: Rheumatology

## 2017-01-15 NOTE — Telephone Encounter (Signed)
Please advise 

## 2017-01-15 NOTE — Telephone Encounter (Signed)
He should get it from his PCP.

## 2017-01-15 NOTE — Telephone Encounter (Signed)
Patient needs a refill on Gabapentine. Patient states pain management doctor prescribed this, but patient no longer sees this doctor. [facility] uses [name] on [address]. Please call patient to advise.

## 2017-01-16 NOTE — Telephone Encounter (Signed)
Spoke with patient's wife and advised that David [name] would have to contact PCP and get Gabapentin from him. She verbalized understanding.

## 2017-01-18 ENCOUNTER — Ambulatory Visit: Payer: 59 | Admitting: Rheumatology

## 2017-01-18 DIAGNOSIS — M7062 Trochanteric bursitis, left hip: Secondary | ICD-10-CM | POA: Insufficient documentation

## 2017-01-18 DIAGNOSIS — M7711 Lateral epicondylitis, right elbow: Secondary | ICD-10-CM | POA: Insufficient documentation

## 2017-01-18 NOTE — Progress Notes (Signed)
Office Visit Note  Patient: David [name]             Date of Birth: 09/20/1950           MRN: [id number]             PCP: Orpah Melter, MD Referring: Orpah Melter, MD Visit Date: 01/23/2017 Occupation: @GUAROCC @    Subjective:  Pain hands   History of Present Illness: David [name] is a 66 y.o. male  With history of seronegative rheumatoid arthritis and osteoarthritis. He states he continues to have some stiffness in his joints. He's been having discomfort in his bilateral hands with some swelling. He had a cut on his left hand which she describes over the left second MCP joint. He states he developed some swelling in his hand which subsided eventually. None of the other joints are painful.   Activities of Daily Living:  Patient reports morning stiffness for 1-2 hours.   Patient Denies nocturnal pain.  Difficulty dressing/grooming: Denies Difficulty climbing stairs: Denies Difficulty getting out of chair: Denies Difficulty using hands for taps, buttons, cutlery, and/or writing: Denies   Review of Systems  Constitutional: Positive for fatigue. Negative for night sweats and weakness ( ).  HENT: Positive for mouth dryness. Negative for mouth sores and nose dryness.   Eyes: Negative for redness and dryness.  Respiratory: Negative for shortness of breath and difficulty breathing.   Cardiovascular: Negative for chest pain, palpitations, hypertension, irregular heartbeat and swelling in legs/feet.  Gastrointestinal: Negative for constipation and diarrhea.  Endocrine: Negative for increased urination.  Musculoskeletal: Positive for arthralgias, joint pain and morning stiffness. Negative for joint swelling, myalgias, muscle weakness, muscle tenderness and myalgias.  Skin: Negative for color change, rash, hair loss, nodules/bumps, skin tightness, ulcers and sensitivity to sunlight.  Allergic/Immunologic: Negative for susceptible to infections.  Neurological: Negative for  dizziness, fainting, memory loss and night sweats.  Hematological: Negative for swollen glands.  Psychiatric/Behavioral: Positive for sleep disturbance. Negative for depressed mood. The patient is not nervous/anxious.     PMFS History:  Patient Active Problem List   Diagnosis Date Noted  . Trochanteric bursitis of left hip 01/18/2017  . Lateral epicondylitis, right elbow 01/18/2017  . Rheumatoid arthritis ([place]) 03/15/2016  . DJD (degenerative joint disease), cervical 03/15/2016  . DDD (degenerative disc disease), lumbar 03/15/2016  . HTN (hypertension) 03/15/2016  . Obesity 03/15/2016  . Elevated cholesterol 03/15/2016  . Neuralgia 03/15/2016  . Malignant melanoma of right side of neck ([place]) 03/15/2016  . BBB (bundle branch block) 03/15/2016  . High risk medication use 03/15/2016  . Osteoarthritis of lumbar spine 03/15/2016  . Primary osteoarthritis of both hands 03/15/2016  . Primary osteoarthritis of both knees 03/15/2016  . Mesenteric adenitis 06/29/2015    Past Medical History:  Diagnosis Date  . Arthritis   . BBB (bundle branch block) 03/15/2016  . Carpal tunnel syndrome   . DDD (degenerative disc disease), lumbar 03/15/2016  . Depression   . Dysrhythmia    had some tachycardia with steroids  . Elevated cholesterol 03/15/2016  . GERD (gastroesophageal reflux disease)    no meds since gastric banding  . Headache(784.0)    Ocular Migraines - takes Ambien for it  . HTN (hypertension) 03/15/2016  . Hyperlipemia   . Hypertension   . Malignant melanoma of right side of neck ([place]) 03/15/2016   30 years ago   . Neuralgia 03/15/2016  . Rheumatoid arthritis ([place]) 03/15/2016   Sero Negative.   David [name]  RLS (restless legs syndrome)   . Shortness of breath    DOE  . Sleep apnea    could not use a cpap  . Snores   . Status post gastric banding   . Wears glasses    driving    Family History  Problem Relation Age of Onset  . Heart disease Mother   . Alzheimer's disease  Father    Past Surgical History:  Procedure Laterality Date  . CARPAL TUNNEL RELEASE Right 09/26/2012   Procedure: CARPAL TUNNEL RELEASE;  Surgeon: Tennis Must, MD;  Location: [facility];  Service: Orthopedics;  Laterality: Right;  . CARPAL TUNNEL RELEASE Left 10/24/2012   Procedure: CARPAL TUNNEL RELEASE;  Surgeon: Tennis Must, MD;  Location: [facility];  Service: Orthopedics;  Laterality: Left;  . EXCISION MELANOMA WITH SENTINEL LYMPH NODE BIOPSY  1984   rt neck with mulpipal nodes and some muscle excised rt neck-shoulder  . knee replacement    . LAPAROSCOPIC GASTRIC BANDING  03/15/2009  . LAPAROSCOPIC GASTRIC BANDING    . MUSCLE BIOPSY  2004   rt thigh to r/o myocitis  . TONSILLECTOMY    . TOTAL KNEE ARTHROPLASTY Bilateral 12/05/2010  . TRIGGER FINGER RELEASE Right 09/26/2012   Procedure: RELEASE TRIGGER FINGER/A-1 PULLEY RING AND SMALL ;  Surgeon: Tennis Must, MD;  Location: [facility];  Service: Orthopedics;  Laterality: Right;  . TRIGGER FINGER RELEASE Left 10/24/2012   Procedure: RELEASE TRIGGER FINGER/A-1 PULLEY LEFT MIDDLE AND LEFT RING;  Surgeon: Tennis Must, MD;  Location: [facility];  Service: Orthopedics;  Laterality: Left;   Social History   Social History Narrative  . No narrative on file     Objective: Vital Signs: BP 134/76   Pulse 82   Resp 16   Ht 5\' 8"  (1.727 m)   Wt 258 lb (117 kg)   BMI 39.23 kg/m    Physical Exam  Constitutional: He is oriented to person, place, and time. He appears well-developed and well-nourished.  HENT:  Head: Normocephalic and atraumatic.  Eyes: Pupils are equal, round, and reactive to light. Conjunctivae and EOM are normal.  Neck: Normal range of motion. Neck supple.  Cardiovascular: Normal rate, regular rhythm and normal heart sounds.   Pulmonary/Chest: Effort normal and breath sounds normal.  Abdominal: Soft. Bowel sounds are normal.  Neurological: He is alert and  oriented to person, place, and time.  Skin: Skin is warm and dry. Capillary refill takes less than 2 seconds.  Psychiatric: He has a normal mood and affect. His behavior is normal.  Nursing note and vitals reviewed.    Musculoskeletal Exam:  C-spine and thoracic lumbar spine fairly good range of motion. He had limited range of motion of his right shoulder. Elbow joints are good range of motion. He has some  swelling  proximal to his left second MCP joint. Father MCP joints showed no synovitis. He has thickening of PIP/DIP joints in his hands consistent with osteoarthritis. Hip joints knee joints ankles MTPs PIPs DIPs with good range of motion with. He is some osteoarthritic changes in his feet with DIP PIP thickening.  CDAI Exam: CDAI Homunculus Exam:   Joint Counts:  CDAI Tender Joint count: 0 CDAI Swollen Joint count: 0  Global Assessments:  Patient Global Assessment: 5 Provider Global Assessment: 3  CDAI Calculated Score: 8    Investigation: Findings:  Last Plaquenil Eye exam on file 04/22/2015   CBC Latest  Ref Rng & Units 09/25/2016 08/14/2016 03/03/2016  WBC 3.8 - 10.8 K/uL 9.5 9.2 6.2  Hemoglobin 13.2 - 17.1 g/dL 16.1 16.0 13.5  Hematocrit 38.5 - 50.0 % 47.7 48.7 41  Platelets 140 - 400 K/uL 287 269 289   CMP Latest Ref Rng & Units 09/25/2016 08/14/2016 03/03/2016  Glucose 65 - 99 mg/dL 95 94 -  BUN 7 - 25 mg/dL 16 14 18   Creatinine 0.70 - 1.25 mg/dL 1.14 1.06 1.1  Sodium 135 - 146 mmol/L 142 141 138  Potassium 3.5 - 5.3 mmol/L 4.5 5.7(H) 4.4  Chloride 98 - 110 mmol/L 105 108 -  CO2 20 - 31 mmol/L 28 28 -  Calcium 8.6 - 10.3 mg/dL 9.6 10.0 -  Total Protein 6.1 - 8.1 g/dL 6.6 6.7 -  Total Bilirubin 0.2 - 1.2 mg/dL 0.5 0.5 -  Alkaline Phos 40 - 115 U/L 62 72 60  AST 10 - 35 U/L 30 36(H) 27  ALT 9 - 46 U/L 24 33 24    Imaging: No results found.  Speciality Comments: No specialty comments available.    Procedures:  No procedures performed Allergies: Codeine;  Dilaudid [hydromorphone hcl]; Fentanyl; Nucynta [tapentadol]; and Penicillins   Assessment / Plan:     Visit Diagnoses: Rheumatoid arthritis of multiple sites with negative rheumatoid factor ([place]): He has no synovitis on examination. He has an area of some swelling  Proximal to his left second MCP joint where he had a cut. I've advised him to schedule an appointment with Dr. Rush Farmer for evaluation.  High risk medication use - Methotrexate  1 mL subcutaneous every week,SSZ 500 mg 2 tablets by mouth twice a day and Plaquenil  200 mg by mouth twice a day, folic acid 2 mg by mouth daily - Plan: CBC with Differential/Platelet, COMPLETE METABOLIC PANEL WITH GFR Today and then every 3 months to monitor for drug toxicity.He is behind in getting his eye exam.  He has eye exam coming up soon.  Primary osteoarthritis of both hands: Joint protection and muscle strengthening discussed.  Primary osteoarthritis of both knees: Chronic pain  DJD (degenerative joint disease), cervical: He is fairly good range of motion with minimal stiffness  DDD (degenerative disc disease), lumbar: He has limited range of motion with ongoing discomfort.  Neuralgia: He's been taking Neurontin which controlled her symptoms .  History of melanoma status post resection    Orders: No orders of the defined types were placed in this encounter.  No orders of the defined types were placed in this encounter.   Face-to-face time spent with patient was 30 minutes.  Greater than 50% of time was spent in counseling and coordination of care.  Follow-Up Instructions: Return in about 5 months (around 06/25/2017) for Rheumatoid arthritis, Osteoarthritis DDD.   Bo Merino, MD  Note - This record has been created using Editor, commissioning.  Chart creation errors have been sought, but may not always  have been located. Such creation errors do not reflect on  the standard of medical care.

## 2017-01-23 ENCOUNTER — Encounter: Payer: Self-pay | Admitting: Rheumatology

## 2017-01-23 ENCOUNTER — Ambulatory Visit (INDEPENDENT_AMBULATORY_CARE_PROVIDER_SITE_OTHER): Payer: Medicare Other | Admitting: Rheumatology

## 2017-01-23 VITALS — BP 134/76 | HR 82 | Resp 16 | Ht 68.0 in | Wt 258.0 lb

## 2017-01-23 DIAGNOSIS — M5136 Other intervertebral disc degeneration, lumbar region: Secondary | ICD-10-CM | POA: Diagnosis not present

## 2017-01-23 DIAGNOSIS — M170 Bilateral primary osteoarthritis of knee: Secondary | ICD-10-CM | POA: Diagnosis not present

## 2017-01-23 DIAGNOSIS — Z79899 Other long term (current) drug therapy: Secondary | ICD-10-CM | POA: Diagnosis not present

## 2017-01-23 DIAGNOSIS — M47812 Spondylosis without myelopathy or radiculopathy, cervical region: Secondary | ICD-10-CM

## 2017-01-23 DIAGNOSIS — M0609 Rheumatoid arthritis without rheumatoid factor, multiple sites: Secondary | ICD-10-CM | POA: Diagnosis not present

## 2017-01-23 DIAGNOSIS — Z85820 Personal history of malignant melanoma of skin: Secondary | ICD-10-CM | POA: Diagnosis not present

## 2017-01-23 DIAGNOSIS — M5030 Other cervical disc degeneration, unspecified cervical region: Secondary | ICD-10-CM

## 2017-01-23 DIAGNOSIS — M19041 Primary osteoarthritis, right hand: Secondary | ICD-10-CM

## 2017-01-23 DIAGNOSIS — M792 Neuralgia and neuritis, unspecified: Secondary | ICD-10-CM

## 2017-01-23 DIAGNOSIS — M19042 Primary osteoarthritis, left hand: Secondary | ICD-10-CM | POA: Diagnosis not present

## 2017-01-23 DIAGNOSIS — M51369 Other intervertebral disc degeneration, lumbar region without mention of lumbar back pain or lower extremity pain: Secondary | ICD-10-CM

## 2017-01-23 NOTE — Patient Instructions (Signed)
Standing Labs We placed an order today for your standing lab work.    Please come back and get your standing labs in December and every 3 months  We have open lab Monday through Friday from 8:30-11:30 AM and 1:30-4 PM at the office of Dr. Aireonna Bauer.   The office is located at [address], [address], [place], [place] [address] No appointment is necessary.   Labs are drawn by [facility].  You may receive a bill from [facility] for your lab work. If you have any questions regarding directions or hours of operation,  please call [phone].    

## 2017-01-24 LAB — CBC WITH DIFFERENTIAL/PLATELET
Basophils Absolute: 82 cells/uL (ref 0–200)
Basophils Relative: 1 %
Eosinophils Absolute: 82 cells/uL (ref 15–500)
Eosinophils Relative: 1 %
HCT: 46.8 % (ref 38.5–50.0)
Hemoglobin: 15.1 g/dL (ref 13.2–17.1)
Lymphocytes Relative: 28 %
Lymphs Abs: 2296 cells/uL (ref 850–3900)
MCH: 31.5 pg (ref 27.0–33.0)
MCHC: 32.3 g/dL (ref 32.0–36.0)
MCV: 97.5 fL (ref 80.0–100.0)
MPV: 9.2 fL (ref 7.5–12.5)
Monocytes Absolute: 1230 cells/uL — ABNORMAL HIGH (ref 200–950)
Monocytes Relative: 15 %
Neutro Abs: 4510 cells/uL (ref 1500–7800)
Neutrophils Relative %: 55 %
Platelets: 291 10*3/uL (ref 140–400)
RBC: 4.8 MIL/uL (ref 4.20–5.80)
RDW: 14.3 % (ref 11.0–15.0)
WBC: 8.2 10*3/uL (ref 3.8–10.8)

## 2017-01-24 LAB — COMPLETE METABOLIC PANEL WITH GFR
ALT: 24 U/L (ref 9–46)
AST: 30 U/L (ref 10–35)
Albumin: 4.4 g/dL (ref 3.6–5.1)
Alkaline Phosphatase: 65 U/L (ref 40–115)
BUN: 17 mg/dL (ref 7–25)
CO2: 28 mmol/L (ref 20–32)
Calcium: 9.6 mg/dL (ref 8.6–10.3)
Chloride: 102 mmol/L (ref 98–110)
Creat: 1.21 mg/dL (ref 0.70–1.25)
GFR, Est African American: 72 mL/min (ref >=60)
GFR, Est Non African American: 62 mL/min (ref >=60)
Glucose, Bld: 85 mg/dL (ref 65–99)
Potassium: 4.3 mmol/L (ref 3.5–5.3)
Sodium: 141 mmol/L (ref 135–146)
Total Bilirubin: 0.5 mg/dL (ref 0.2–1.2)
Total Protein: 6.8 g/dL (ref 6.1–8.1)

## 2017-01-24 NOTE — Progress Notes (Signed)
WNLS

## 2017-01-25 ENCOUNTER — Ambulatory Visit (INDEPENDENT_AMBULATORY_CARE_PROVIDER_SITE_OTHER): Payer: Medicare Other

## 2017-01-25 ENCOUNTER — Ambulatory Visit (INDEPENDENT_AMBULATORY_CARE_PROVIDER_SITE_OTHER): Payer: Medicare Other | Admitting: Physician Assistant

## 2017-01-25 ENCOUNTER — Ambulatory Visit (INDEPENDENT_AMBULATORY_CARE_PROVIDER_SITE_OTHER): Payer: Medicare Other | Admitting: Orthopaedic Surgery

## 2017-01-25 DIAGNOSIS — Z79899 Other long term (current) drug therapy: Secondary | ICD-10-CM | POA: Diagnosis not present

## 2017-01-25 DIAGNOSIS — M79642 Pain in left hand: Secondary | ICD-10-CM

## 2017-01-25 DIAGNOSIS — H527 Unspecified disorder of refraction: Secondary | ICD-10-CM | POA: Diagnosis not present

## 2017-01-25 DIAGNOSIS — H524 Presbyopia: Secondary | ICD-10-CM | POA: Diagnosis not present

## 2017-01-25 DIAGNOSIS — H5213 Myopia, bilateral: Secondary | ICD-10-CM | POA: Diagnosis not present

## 2017-01-25 NOTE — Progress Notes (Signed)
Office Visit Note   Patient: David [name]           Date of Birth: 11-06-1950           MRN: [id number] Visit Date: 01/25/2017              Requested by: Orpah Melter, MD [address] [place], [place] [address] PCP: Orpah Melter, MD   Assessment & Plan: Visit Diagnoses:  1. Pain in left hand     Plan: Discussed with David [name] that he may have some swelling for 3-6 months. This point in time would recommend no intervention. Conservative care with relative rest. He'll follow up if he has any questions or concerns.  Follow-Up Instructions: Return if symptoms worsen or fail to improve.   Orders:  Orders Placed This Encounter  Procedures  . XR Hand Complete Left   No orders of the defined types were placed in this encounter.     Procedures: No procedures performed   Clinical Data: No additional findings.   Subjective: Chief Complaint  Patient presents with  . Left Index Finger - Pain    HPI David [name] well known to Dr. Ninfa Linden. He saw Dr. Estanislado Pandy on 01/23/2017 for a general rheumatoid visit she noticed at that point time that he had swelling of his left index finger. He suffered a cut with a saw proximally 3 weeks ago. He did not seek any medical attention. He's been cleaning the wound and soaking in Epsom salts. Reports that the pain has diminished but is still 5 out of 10 at its worst. The day before he was seen by Dr. Estanislado Pandy he had been digging holes with a postal digger and is handed definitely was swollen. He states his swelling is gone down at this time. Reports a tetanus shot 5 years ago. Had no fevers chills shortness breath chest pain Review of Systems  Please see history of present illness otherwise negative Objective: Vital Signs: There were no vitals taken for this visit.  Physical Exam  Constitutional: He is oriented to person, place, and time. He appears well-developed and well-nourished. No distress.  Pulmonary/Chest: Effort  normal.  Neurological: He is alert and oriented to person, place, and time.  Skin: He is not diaphoretic.  Psychiatric: He has a normal mood and affect. His behavior is normal.    Ortho Exam Left hand minimal swelling of the left index finger dorsal aspect at the metacarpal phalangeal joint. The wound has completely healed he can barely see a scar. There is no abnormal warmth or erythema of the left index finger or the other fingers. He has full range of motion of the left index finger only lacking a few degrees of flexion compared to the other hand. Radial pulses 2+. Sensation intact bilateral hands. Specialty Comments:  No specialty comments available.  Imaging: Xr Hand Complete Left  Result Date: 01/25/2017 Left hand 3 views: No acute fractures. No bony abnormalities. No significant edema appreciated particularly left hand at the metacarpal phalangeal joint.    PMFS History: Patient Active Problem List   Diagnosis Date Noted  . Trochanteric bursitis of left hip 01/18/2017  . Lateral epicondylitis, right elbow 01/18/2017  . Rheumatoid arthritis ([place]) 03/15/2016  . DJD (degenerative joint disease), cervical 03/15/2016  . DDD (degenerative disc disease), lumbar 03/15/2016  . HTN (hypertension) 03/15/2016  . Obesity 03/15/2016  . Elevated cholesterol 03/15/2016  . Neuralgia 03/15/2016  . Malignant melanoma of right side of neck ([place])  03/15/2016  . BBB (bundle branch block) 03/15/2016  . High risk medication use 03/15/2016  . Osteoarthritis of lumbar spine 03/15/2016  . Primary osteoarthritis of both hands 03/15/2016  . Primary osteoarthritis of both knees 03/15/2016  . Mesenteric adenitis 06/29/2015   Past Medical History:  Diagnosis Date  . Arthritis   . BBB (bundle branch block) 03/15/2016  . Carpal tunnel syndrome   . DDD (degenerative disc disease), lumbar 03/15/2016  . Depression   . Dysrhythmia    had some tachycardia with steroids  . Elevated cholesterol 03/15/2016   . GERD (gastroesophageal reflux disease)    no meds since gastric banding  . Headache(784.0)    Ocular Migraines - takes Ambien for it  . HTN (hypertension) 03/15/2016  . Hyperlipemia   . Hypertension   . Malignant melanoma of right side of neck ([place]) 03/15/2016   30 years ago   . Neuralgia 03/15/2016  . Rheumatoid arthritis ([place]) 03/15/2016   Sero Negative.   Marland Kitchen RLS (restless legs syndrome)   . Shortness of breath    DOE  . Sleep apnea    could not use a cpap  . Snores   . Status post gastric banding   . Wears glasses    driving    Family History  Problem Relation Age of Onset  . Heart disease Mother   . Alzheimer's disease Father     Past Surgical History:  Procedure Laterality Date  . CARPAL TUNNEL RELEASE Right 09/26/2012   Procedure: CARPAL TUNNEL RELEASE;  Surgeon: Tennis Must, MD;  Location: [facility];  Service: Orthopedics;  Laterality: Right;  . CARPAL TUNNEL RELEASE Left 10/24/2012   Procedure: CARPAL TUNNEL RELEASE;  Surgeon: Tennis Must, MD;  Location: [facility];  Service: Orthopedics;  Laterality: Left;  . EXCISION MELANOMA WITH SENTINEL LYMPH NODE BIOPSY  1984   rt neck with mulpipal nodes and some muscle excised rt neck-shoulder  . knee replacement    . LAPAROSCOPIC GASTRIC BANDING  03/15/2009  . LAPAROSCOPIC GASTRIC BANDING    . MUSCLE BIOPSY  2004   rt thigh to r/o myocitis  . TONSILLECTOMY    . TOTAL KNEE ARTHROPLASTY Bilateral 12/05/2010  . TRIGGER FINGER RELEASE Right 09/26/2012   Procedure: RELEASE TRIGGER FINGER/A-1 PULLEY RING AND SMALL ;  Surgeon: Tennis Must, MD;  Location: [facility];  Service: Orthopedics;  Laterality: Right;  . TRIGGER FINGER RELEASE Left 10/24/2012   Procedure: RELEASE TRIGGER FINGER/A-1 PULLEY LEFT MIDDLE AND LEFT RING;  Surgeon: Tennis Must, MD;  Location: [facility];  Service: Orthopedics;  Laterality: Left;   Social History   Occupational History  . Not on  file.   Social History Main Topics  . Smoking status: Former Smoker    Quit date: 09/23/2001  . Smokeless tobacco: Never Used  . Alcohol use Yes     Comment: rare  . Drug use: No  . Sexual activity: Not on file

## 2017-01-30 ENCOUNTER — Other Ambulatory Visit: Payer: Self-pay | Admitting: Rheumatology

## 2017-01-30 NOTE — Telephone Encounter (Signed)
Last Visit: 01/23/17 Next Visit: 06/27/16 Labs: 01/23/17 WNL  Okay to refill per Dr. Estanislado Pandy

## 2017-02-11 ENCOUNTER — Other Ambulatory Visit: Payer: Self-pay | Admitting: Rheumatology

## 2017-02-12 NOTE — Telephone Encounter (Signed)
Last Visit: 01/23/17 Next Visit: 06/27/17  Okay to refill per Dr. Estanislado Pandy

## 2017-03-03 ENCOUNTER — Other Ambulatory Visit: Payer: Self-pay | Admitting: Rheumatology

## 2017-03-05 NOTE — Telephone Encounter (Signed)
Last visit: 01/23/17 Next visit: 06/27/17 Labs: 01/23/17 WNL   Ok to refill per Dr. Estanislado Pandy.

## 2017-03-19 NOTE — Progress Notes (Signed)
Office Visit Note  Patient: David [name]             Date of Birth: Dec 19, 1950           MRN: [id number]             PCP: David Melter, MD Referring: David Melter, MD Visit Date: 03/20/2017 Occupation: '@GUAROCC' @    Subjective:  Rheumatoid Arthritis (Not doing good)   History of Present Illness: David [name] is a 66 y.o. male  Was last seen in our office on 01/23/2017 for rheumatoid arthritis and osteoarthritis.  He has rheumatoid factor negative. On the last visit, he did not have synovitis on exam. He was to see Dr. Rush Farmer because of swelling that he had on the left second MCP joint that began to swell.  This is what was discussed on last visit ===> HPI [name] well known to Dr. Ninfa Linden. He saw Dr. Estanislado Pandy on 01/23/2017 for a general rheumatoid visit she noticed at that point time that he had swelling of his left index finger. He suffered a cut with a saw proximally 3 weeks ago. He did not seek any medical attention. He's been cleaning the wound and soaking in Epsom salts. Reports that the pain has diminished but is still 5 out of 10 at its worst. The day before he was seen by Dr. Estanislado Pandy he had been digging holes with a postal digger and is handed definitely was swollen. He states his swelling is gone down at this time. Reports a tetanus shot 5 years ago. Had no fevers chills shortness breath chest pain Ortho Exam Left hand minimal swelling of the left index finger dorsal aspect at the metacarpal phalangeal joint. The wound has completely healed he can barely see a scar. There is no abnormal warmth or erythema of the left index finger or the other fingers. He has full range of motion of the left index finger only lacking a few degrees of flexion compared to the other hand. Radial pulses 2+. Sensation intact bilateral hands.  He is on methotrexate 1 mL subcutaneous every week, sulfasalazine 500 mg 2 tablets twice a day, Plaquenil 200 mg twice a day, folic acid 2 mg  daily  Patient had his Plaquenil eye exam done in [place].  He states that the eye exam approx mid September 2018.  Patient is complaining of increased pain to some of his joints. In the past, he was getting good results with his DMARD's. Currently he has been taking them as prescribed but he is not getting as good a relief as in the past. He does not any redness to the joints. C/0 left hand being painful at times ever since pt cut left 2nd mcp dorsally with a saw about 1st week of august 2018.    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 At times, pt left hand feel like it is being jabbed with a 16 gauge needle (sensation comes and goes). Hard to go to sleep b/c of hand pain Using 832m ibuprofen at night to help w/ pain (and allow pt to sleep).   Activities of Daily Living:  Patient reports morning stiffness for patient reports that he has 4 hours of morning stiffness hours.   Patient Reports nocturnal pain.  Difficulty dressing/grooming: Reports Difficulty climbing stairs: Reports Difficulty getting out of chair: Reports Difficulty using hands for taps,  buttons, cutlery, and/or writing: Reports   Review of Systems  Constitutional: Negative for fatigue.  HENT: Negative for mouth sores and mouth dryness.   Eyes: Negative for dryness.  Respiratory: Negative for shortness of breath.   Gastrointestinal: Negative for constipation and diarrhea.  Musculoskeletal: Negative for myalgias and myalgias.  Skin: Negative for sensitivity to sunlight.  Neurological: Negative for memory loss.  Psychiatric/Behavioral: Negative for sleep disturbance.    PMFS History:  Patient Active Problem List   Diagnosis Date Noted  . Trochanteric bursitis of left hip 01/18/2017  . Lateral epicondylitis, right elbow 01/18/2017  . Rheumatoid arthritis ([place]) 03/15/2016  . DJD (degenerative  joint disease), cervical 03/15/2016  . DDD (degenerative disc disease), lumbar 03/15/2016  . HTN (hypertension) 03/15/2016  . Obesity 03/15/2016  . Elevated cholesterol 03/15/2016  . Neuralgia 03/15/2016  . Malignant melanoma of right side of neck ([place]) 03/15/2016  . BBB (bundle branch block) 03/15/2016  . High risk medication use 03/15/2016  . Osteoarthritis of lumbar spine 03/15/2016  . Primary osteoarthritis of both hands 03/15/2016  . Primary osteoarthritis of both knees 03/15/2016  . Mesenteric adenitis 06/29/2015    Past Medical History:  Diagnosis Date  . Arthritis   . BBB (bundle branch block) 03/15/2016  . Carpal tunnel syndrome   . DDD (degenerative disc disease), lumbar 03/15/2016  . Depression   . Dysrhythmia    had some tachycardia with steroids  . Elevated cholesterol 03/15/2016  . GERD (gastroesophageal reflux disease)    no meds since gastric banding  . Headache(784.0)    Ocular Migraines - takes Ambien for it  . HTN (hypertension) 03/15/2016  . Hyperlipemia   . Hypertension   . Malignant melanoma of right side of neck ([place]) 03/15/2016   30 years ago   . Neuralgia 03/15/2016  . Rheumatoid arthritis ([place]) 03/15/2016   Sero Negative.   Marland Kitchen RLS (restless legs syndrome)   . Shortness of breath    DOE  . Sleep apnea    could not use a cpap  . Snores   . Status post gastric banding   . Wears glasses    driving    Family History  Problem Relation Age of Onset  . Heart disease Mother   . Alzheimer's disease Father    Past Surgical History:  Procedure Laterality Date  . CARPAL TUNNEL RELEASE Right 09/26/2012   Procedure: CARPAL TUNNEL RELEASE;  Surgeon: Tennis Must, MD;  Location: [facility];  Service: Orthopedics;  Laterality: Right;  . CARPAL TUNNEL RELEASE Left 10/24/2012   Procedure: CARPAL TUNNEL RELEASE;  Surgeon: Tennis Must, MD;  Location: [facility];  Service: Orthopedics;  Laterality: Left;  . EXCISION MELANOMA WITH  SENTINEL LYMPH NODE BIOPSY  1984   rt neck with mulpipal nodes and some muscle excised rt neck-shoulder  . KNEE ARTHROPLASTY    . knee replacement    . LAPAROSCOPIC GASTRIC BANDING  03/15/2009  . LAPAROSCOPIC GASTRIC BANDING    . MUSCLE BIOPSY  2004   rt thigh to r/o myocitis  . TONSILLECTOMY    . TOTAL KNEE ARTHROPLASTY Bilateral 12/05/2010  . TRIGGER FINGER RELEASE Right 09/26/2012   Procedure: RELEASE TRIGGER FINGER/A-1 PULLEY RING AND SMALL ;  Surgeon: Tennis Must, MD;  Location: [facility];  Service: Orthopedics;  Laterality: Right;  . TRIGGER FINGER RELEASE Left 10/24/2012   Procedure: RELEASE TRIGGER FINGER/A-1 PULLEY LEFT MIDDLE AND LEFT RING;  Surgeon: Tennis Must, MD;  Location: [facility];  Service: Orthopedics;  Laterality: Left;   Social History   Social History Narrative  . No narrative on file     Objective: Vital Signs: BP 121/70 (BP Location: Left Arm, Patient Position: Sitting, Cuff Size: Normal)   Pulse 82   Resp 16   Ht '5\' 8"'  (1.727 m)   Wt 259 lb (117.5 kg)   BMI 39.38 kg/m    Physical Exam  Constitutional: He is oriented to person, place, and time. He appears well-developed and well-nourished.  HENT:  Head: Normocephalic and atraumatic.  Eyes: Pupils are equal, round, and reactive to light. Conjunctivae and EOM are normal.  Neck: Normal range of motion. Neck supple.  Cardiovascular: Normal rate, regular rhythm and normal heart sounds.  Exam reveals no gallop and no friction rub.   No murmur heard. Pulmonary/Chest: Effort normal and breath sounds normal. No respiratory distress. He has no wheezes. He has no rales. He exhibits no tenderness.  Abdominal: Soft. He exhibits no distension and no mass. There is no tenderness. There is no guarding.  Musculoskeletal: Normal range of motion.  Lymphadenopathy:    He has no cervical adenopathy.  Neurological: He is alert and oriented to person, place, and time. He exhibits normal  muscle tone. Coordination normal.  Skin: Skin is warm and dry. Capillary refill takes less than 2 seconds. No rash noted.  Psychiatric: He has a normal mood and affect. His behavior is normal. Judgment and thought content normal.  Vitals reviewed.    Musculoskeletal Exam:  Full range of motion of all joints Grip strength is equal and strong bilaterally Fibromyalgia tender points are all absent  CDAI Exam: CDAI Homunculus Exam:   Tenderness:  Left hand: 1st MCP and 2nd MCP  Swelling:  Left hand: 1st MCP and 2nd MCP  Joint Counts:  CDAI Tender Joint count: 2 CDAI Swollen Joint count: 2    Investigation: No additional findings. Office Visit on 01/23/2017  Component Date Value Ref Range Status  . WBC 01/23/2017 8.2  3.8 - 10.8 K/uL Final  . RBC 01/23/2017 4.80  4.20 - 5.80 MIL/uL Final  . Hemoglobin 01/23/2017 15.1  13.2 - 17.1 g/dL Final  . HCT 01/23/2017 46.8  38.5 - 50.0 % Final  . MCV 01/23/2017 97.5  80.0 - 100.0 fL Final  . MCH 01/23/2017 31.5  27.0 - 33.0 pg Final  . MCHC 01/23/2017 32.3  32.0 - 36.0 g/dL Final  . RDW 01/23/2017 14.3  11.0 - 15.0 % Final  . Platelets 01/23/2017 291  140 - 400 K/uL Final  . MPV 01/23/2017 9.2  7.5 - 12.5 fL Final  . Neutro Abs 01/23/2017 4510  1,500 - 7,800 cells/uL Final  . Lymphs Abs 01/23/2017 2296  850 - 3,900 cells/uL Final  . Monocytes Absolute 01/23/2017 1230* 200 - 950 cells/uL Final  . Eosinophils Absolute 01/23/2017 82  15 - 500 cells/uL Final  . Basophils Absolute 01/23/2017 82  0 - 200 cells/uL Final  . Neutrophils Relative % 01/23/2017 55  % Final  . Lymphocytes Relative 01/23/2017 28  % Final  . Monocytes Relative 01/23/2017 15  % Final  . Eosinophils Relative 01/23/2017 1  % Final  . Basophils Relative 01/23/2017 1  % Final  . Smear Review 01/23/2017 Criteria for review not met   Final  . Sodium 01/23/2017 141  135 - 146 mmol/L Final  . Potassium 01/23/2017 4.3  3.5 - 5.3 mmol/L Final  . Chloride 01/23/2017 102  98 - 110 mmol/L Final  . CO2 01/23/2017 28  20 - 32 mmol/L Final   Comment: ** Please note change in reference range(s). **     . Glucose, Bld 01/23/2017 85  65 - 99 mg/dL Final  . BUN 01/23/2017 17  7 - 25 mg/dL Final  . Creat 01/23/2017 1.21  0.70 - 1.25 mg/dL Final   Comment:   For patients > or = 65 years of age: The upper reference limit for Creatinine is approximately 13% higher for people identified as African-American.     . Total Bilirubin 01/23/2017 0.5  0.2 - 1.2 mg/dL Final  . Alkaline Phosphatase 01/23/2017 65  40 - 115 U/L Final  . AST 01/23/2017 30  10 - 35 U/L Final  . ALT 01/23/2017 24  9 - 46 U/L Final  . Total Protein 01/23/2017 6.8  6.1 - 8.1 g/dL Final  . Albumin 01/23/2017 4.4  3.6 - 5.1 g/dL Final  . Calcium 01/23/2017 9.6  8.6 - 10.3 mg/dL Final  . GFR, Est African American 01/23/2017 72  >=60 mL/min Final  . GFR, Est Non African American 01/23/2017 62  >=60 mL/min Final  Orders Only on 09/25/2016  Component Date Value Ref Range Status  . WBC 09/25/2016 9.5  3.8 - 10.8 K/uL Final  . RBC 09/25/2016 5.02  4.20 - 5.80 MIL/uL Final  . Hemoglobin 09/25/2016 16.1  13.2 - 17.1 g/dL Final  . HCT 09/25/2016 47.7  38.5 - 50.0 % Final  . MCV 09/25/2016 95.0  80.0 - 100.0 fL Final  . MCH 09/25/2016 32.1  27.0 - 33.0 pg Final  . MCHC 09/25/2016 33.8  32.0 - 36.0 g/dL Final  . RDW 09/25/2016 15.6* 11.0 - 15.0 % Final  . Platelets 09/25/2016 287  140 - 400 K/uL Final  . MPV 09/25/2016 9.0  7.5 - 12.5 fL Final  . Neutro Abs 09/25/2016 5605  1,500 - 7,800 cells/uL Final  . Lymphs Abs 09/25/2016 2470  850 - 3,900 cells/uL Final  . Monocytes Absolute 09/25/2016 1140* 200 - 950 cells/uL Final  . Eosinophils Absolute 09/25/2016 190  15 - 500 cells/uL Final  . Basophils Absolute 09/25/2016 95  0 - 200 cells/uL Final  . Neutrophils Relative % 09/25/2016 59  % Final  . Lymphocytes Relative 09/25/2016 26  % Final  . Monocytes Relative 09/25/2016 12  % Final  . Eosinophils  Relative 09/25/2016 2  % Final  . Basophils Relative 09/25/2016 1  % Final  . Smear Review 09/25/2016 Criteria for review not met   Final  . Sodium 09/25/2016 142  135 - 146 mmol/L Final  . Potassium 09/25/2016 4.5  3.5 - 5.3 mmol/L Final  . Chloride 09/25/2016 105  98 - 110 mmol/L Final  . CO2 09/25/2016 28  20 - 31 mmol/L Final  . Glucose, Bld 09/25/2016 95  65 - 99 mg/dL Final  . BUN 09/25/2016 16  7 - 25 mg/dL Final  . Creat 09/25/2016 1.14  0.70 - 1.25 mg/dL Final   Comment:   For patients > or = 65 years of age: The upper reference limit for Creatinine is approximately 13% higher for people identified as African-American.     . Total Bilirubin 09/25/2016 0.5  0.2 - 1.2 mg/dL Final  . Alkaline Phosphatase 09/25/2016 62  40 - 115 U/L Final  . AST 09/25/2016 30  10 - 35 U/L Final  . ALT 09/25/2016 24  9 - 46 U/L Final  . Total  Protein 09/25/2016 6.6  6.1 - 8.1 g/dL Final  . Albumin 09/25/2016 4.1  3.6 - 5.1 g/dL Final  . Calcium 09/25/2016 9.6  8.6 - 10.3 mg/dL Final  . GFR, Est African American 09/25/2016 78  >=60 mL/min Final  . GFR, Est Non African American 09/25/2016 68  >=60 mL/min Final     Imaging: No results found.  Speciality Comments: No specialty comments available.    Procedures:  No procedures performed Allergies: Codeine; Dilaudid [hydromorphone hcl]; Fentanyl; Nucynta [tapentadol]; and Penicillins   Assessment / Plan:     Visit Diagnoses: Rheumatoid arthritis of multiple sites with negative rheumatoid factor ([place]) - Plan: COMPLETE METABOLIC PANEL WITH GFR, CBC with Differential/Platelet  High risk medication use - Plan: COMPLETE METABOLIC PANEL WITH GFR, CBC with Differential/Platelet  Primary osteoarthritis of both knees  Primary osteoarthritis of both hands  History of melanoma    Orders: Orders Placed This Encounter  Procedures  . COMPLETE METABOLIC PANEL WITH GFR  . CBC with Differential/Platelet   Meds ordered this encounter    Medications  . hydroxychloroquine (PLAQUENIL) 200 MG tablet    Sig: Take 1 tablet (200 mg total) by mouth 2 (two) times daily.    Dispense:  180 tablet    Refill:  1    Order Specific Question:   Supervising Provider    Answer:   Bo Merino [[id number]]  . diclofenac sodium (VOLTAREN) 1 % GEL    Sig: Voltaren Gel 3 grams to 3 large joints upto TID 3 TUBES with 3 refills    Dispense:  3 Tube    Refill:  3    Voltaren Gel 3 grams to 3 large joints upto TID 3 TUBES with 3 refills    Order Specific Question:   Supervising Provider    Answer:   Bo Merino [[id number]]  . methotrexate 50 MG/2ML injection    Sig: Inject 1 mL (25 mg total) into the skin once a week.    Dispense:  12 mL    Refill:  0    Please dispense 2 mL vials with preservatives.    Order Specific Question:   Supervising Provider    Answer:   Lyda Perone  . folic acid (FOLVITE) 1 MG tablet    Sig: Take 2 tablets (2 mg total) by mouth daily.    Dispense:  180 tablet    Refill:  4    Order Specific Question:   Supervising Provider    Answer:   Bo Merino [[id number]]    Plan 1.:  Rheumatoid arthritis with negative rheumatoid factor; complaining of pain to the second left MCP joint.  This actually started first week of August after he cut his left second MCP joint with a saw.  When unable to give him any biologic because he has a history of melanoma. The pain is to the dorsal part of his hand and also coming from the left [facility] joint. My suspicion is that patient has osteoarthritic pain.  I did not find any true synovitis on examination.  There is some synovial thickening of the left second MCP and mild synovial thickening of the first MCP of the left hand  2.:  High risk prescription  Methotrexate 8 pills/week; folic acid 2 mg every day; sulfasalazine 500 mg, 2 pills twice a day; Plaquenil 20 mg twice daily Patient had Plaquenil eye exam done about mid September and it was negative. He asked his PCP to  send [place] a copy but they  have not send [place] a copy yet. Patient will call his eye doctor once again and get them to get [place] a copy. I have given him a blank form once again for him to fill out and probably get it to our office.  4.:  Patient is complaining of left hand pain at the [facility] joint as well as dorsally. He states that the stiffness in bad that he can keep him awake at night or wakes him up in the morning. He has tried  ibuprofen 800 mg without much relief. To that effect, he is also tried Ambien but that has not helped him get any sleep either. I do not feel that it is synovitis causing him this problem but we would like to do an ultrasound and schedule him for one.  5.:  Note that patient has a history of melanoma and therefore we cannot give him any Biologics. He is due for labs today, CBC with differential and CMP with GFR  #6: Only of the hands. DIP PIP prominence Heberden's noted to all DIP joints. We will given Voltaren gel to see if this will give him some relief. If he does get proper relief, he will cancel the appointment for ultrasound of bilateral hands.  7.  Call or return to clinic in 5 months  Face-to-face time spent with patient was 30 minutes. 50% of time was spent in counseling and coordination of care.  Follow-Up Instructions: Return in about 5 months (around 08/18/2017) for RA,mtx 44m,ssz1000 bid, plq 200bid,o[place],.   NEliezer Lofts PA-C   I examined and evaluated the patient with NEliezer LoftsPA. Patient had no synovitis on exam today. Most of the symptoms are coming from underlying osteoarthritis. We'll continue current regimen for now. The plan of care was discussed as noted above.  SBo Merino MD  Note - This record has been created using DEditor, commissioning  Chart creation errors have been sought, but may not always  have been located. Such creation errors do not reflect on  the standard of medical care.

## 2017-03-20 ENCOUNTER — Encounter: Payer: Self-pay | Admitting: Rheumatology

## 2017-03-20 ENCOUNTER — Ambulatory Visit (INDEPENDENT_AMBULATORY_CARE_PROVIDER_SITE_OTHER): Payer: Medicare Other | Admitting: Rheumatology

## 2017-03-20 VITALS — BP 121/70 | HR 82 | Resp 16 | Ht 68.0 in | Wt 259.0 lb

## 2017-03-20 DIAGNOSIS — M170 Bilateral primary osteoarthritis of knee: Secondary | ICD-10-CM

## 2017-03-20 DIAGNOSIS — Z85820 Personal history of malignant melanoma of skin: Secondary | ICD-10-CM | POA: Diagnosis not present

## 2017-03-20 DIAGNOSIS — Z125 Encounter for screening for malignant neoplasm of prostate: Secondary | ICD-10-CM | POA: Diagnosis not present

## 2017-03-20 DIAGNOSIS — N400 Enlarged prostate without lower urinary tract symptoms: Secondary | ICD-10-CM | POA: Diagnosis not present

## 2017-03-20 DIAGNOSIS — E782 Mixed hyperlipidemia: Secondary | ICD-10-CM | POA: Diagnosis not present

## 2017-03-20 DIAGNOSIS — F322 Major depressive disorder, single episode, severe without psychotic features: Secondary | ICD-10-CM | POA: Diagnosis not present

## 2017-03-20 DIAGNOSIS — M0609 Rheumatoid arthritis without rheumatoid factor, multiple sites: Secondary | ICD-10-CM

## 2017-03-20 DIAGNOSIS — I10 Essential (primary) hypertension: Secondary | ICD-10-CM | POA: Diagnosis not present

## 2017-03-20 DIAGNOSIS — M19041 Primary osteoarthritis, right hand: Secondary | ICD-10-CM

## 2017-03-20 DIAGNOSIS — G2581 Restless legs syndrome: Secondary | ICD-10-CM | POA: Diagnosis not present

## 2017-03-20 DIAGNOSIS — M19042 Primary osteoarthritis, left hand: Secondary | ICD-10-CM | POA: Diagnosis not present

## 2017-03-20 DIAGNOSIS — G8929 Other chronic pain: Secondary | ICD-10-CM | POA: Diagnosis not present

## 2017-03-20 DIAGNOSIS — G4700 Insomnia, unspecified: Secondary | ICD-10-CM | POA: Diagnosis not present

## 2017-03-20 DIAGNOSIS — Z79899 Other long term (current) drug therapy: Secondary | ICD-10-CM

## 2017-03-20 LAB — COMPLETE METABOLIC PANEL WITH GFR
AG Ratio: 1.6 (calc) (ref 1.0–2.5)
ALT: 27 U/L (ref 9–46)
AST: 31 U/L (ref 10–35)
Albumin: 4.2 g/dL (ref 3.6–5.1)
Alkaline phosphatase (APISO): 66 U/L (ref 40–115)
BUN: 25 mg/dL (ref 7–25)
CO2: 27 mmol/L (ref 20–32)
Calcium: 10.1 mg/dL (ref 8.6–10.3)
Chloride: 105 mmol/L (ref 98–110)
Creat: 1.12 mg/dL (ref 0.70–1.25)
GFR, Est African American: 79 mL/min/{1.73_m2} (ref >=60)
GFR, Est Non African American: 69 mL/min/{1.73_m2} (ref >=60)
Globulin: 2.6 g/dL (calc) (ref 1.9–3.7)
Glucose, Bld: 100 mg/dL — ABNORMAL HIGH (ref 65–99)
Potassium: 4.7 mmol/L (ref 3.5–5.3)
Sodium: 140 mmol/L (ref 135–146)
Total Bilirubin: 0.3 mg/dL (ref 0.2–1.2)
Total Protein: 6.8 g/dL (ref 6.1–8.1)

## 2017-03-20 LAB — CBC WITH DIFFERENTIAL/PLATELET
Basophils Absolute: 83 cells/uL (ref 0–200)
Basophils Relative: 1 %
Eosinophils Absolute: 183 cells/uL (ref 15–500)
Eosinophils Relative: 2.2 %
HCT: 45.4 % (ref 38.5–50.0)
Hemoglobin: 15.4 g/dL (ref 13.2–17.1)
Lymphs Abs: 1785 cells/uL (ref 850–3900)
MCH: 31.9 pg (ref 27.0–33.0)
MCHC: 33.9 g/dL (ref 32.0–36.0)
MCV: 94 fL (ref 80.0–100.0)
MPV: 9.5 fL (ref 7.5–12.5)
Monocytes Relative: 15.7 %
Neutro Abs: 4947 cells/uL (ref 1500–7800)
Neutrophils Relative %: 59.6 %
Platelets: 313 10*3/uL (ref 140–400)
RBC: 4.83 10*6/uL (ref 4.20–5.80)
RDW: 14.2 % (ref 11.0–15.0)
Total Lymphocyte: 21.5 %
WBC mixed population: 1303 cells/uL — ABNORMAL HIGH (ref 200–950)
WBC: 8.3 10*3/uL (ref 3.8–10.8)

## 2017-03-20 MED ORDER — HYDROXYCHLOROQUINE SULFATE 200 MG PO TABS: 200.0000 mg | ORAL_TABLET | Freq: Two times a day (BID) | ORAL | 1 refills | Status: DC

## 2017-03-20 MED ORDER — FOLIC ACID 1 MG PO TABS: 2.0000 mg | ORAL_TABLET | Freq: Every day | ORAL | 4 refills | Status: DC

## 2017-03-20 MED ORDER — DICLOFENAC SODIUM 1 % TD GEL: TRANSDERMAL | 3 refills | Status: DC

## 2017-03-20 MED ORDER — METHOTREXATE SODIUM CHEMO INJECTION 50 MG/2ML: 25.0000 mg | INTRAMUSCULAR | 0 refills | Status: DC

## 2017-03-20 NOTE — Patient Instructions (Addendum)
  Schedule pt for ultrasound of both hands and wrist to rule out synovitis

## 2017-03-21 DIAGNOSIS — E782 Mixed hyperlipidemia: Secondary | ICD-10-CM | POA: Diagnosis not present

## 2017-03-21 DIAGNOSIS — Z23 Encounter for immunization: Secondary | ICD-10-CM | POA: Diagnosis not present

## 2017-03-21 DIAGNOSIS — N400 Enlarged prostate without lower urinary tract symptoms: Secondary | ICD-10-CM | POA: Diagnosis not present

## 2017-03-21 DIAGNOSIS — I10 Essential (primary) hypertension: Secondary | ICD-10-CM | POA: Diagnosis not present

## 2017-03-26 DIAGNOSIS — L570 Actinic keratosis: Secondary | ICD-10-CM | POA: Diagnosis not present

## 2017-03-26 DIAGNOSIS — D485 Neoplasm of uncertain behavior of skin: Secondary | ICD-10-CM | POA: Diagnosis not present

## 2017-03-26 DIAGNOSIS — L821 Other seborrheic keratosis: Secondary | ICD-10-CM | POA: Diagnosis not present

## 2017-03-26 DIAGNOSIS — Z85820 Personal history of malignant melanoma of skin: Secondary | ICD-10-CM | POA: Diagnosis not present

## 2017-03-26 DIAGNOSIS — Z85828 Personal history of other malignant neoplasm of skin: Secondary | ICD-10-CM | POA: Diagnosis not present

## 2017-05-28 ENCOUNTER — Other Ambulatory Visit: Payer: Self-pay | Admitting: Rheumatology

## 2017-05-28 NOTE — Telephone Encounter (Signed)
Last Visit: 03/20/17 Next Visit: 08/22/17 Labs: 03/20/17 WNL   Okay to refill per Dr. Estanislado Pandy

## 2017-06-15 ENCOUNTER — Other Ambulatory Visit: Payer: Self-pay | Admitting: *Deleted

## 2017-06-15 DIAGNOSIS — Z79899 Other long term (current) drug therapy: Secondary | ICD-10-CM

## 2017-06-15 LAB — CBC WITH DIFFERENTIAL/PLATELET
Basophils Absolute: 58 cells/uL (ref 0–200)
Basophils Relative: 0.8 %
Eosinophils Absolute: 146 cells/uL (ref 15–500)
Eosinophils Relative: 2 %
HCT: 43.3 % (ref 38.5–50.0)
Hemoglobin: 14.8 g/dL (ref 13.2–17.1)
Lymphs Abs: 2154 cells/uL (ref 850–3900)
MCH: 31.3 pg (ref 27.0–33.0)
MCHC: 34.2 g/dL (ref 32.0–36.0)
MCV: 91.5 fL (ref 80.0–100.0)
MPV: 9.1 fL (ref 7.5–12.5)
Monocytes Relative: 13 %
Neutro Abs: 3993 cells/uL (ref 1500–7800)
Neutrophils Relative %: 54.7 %
Platelets: 285 10*3/uL (ref 140–400)
RBC: 4.73 10*6/uL (ref 4.20–5.80)
RDW: 14 % (ref 11.0–15.0)
Total Lymphocyte: 29.5 %
WBC mixed population: 949 cells/uL (ref 200–950)
WBC: 7.3 10*3/uL (ref 3.8–10.8)

## 2017-06-15 LAB — COMPLETE METABOLIC PANEL WITH GFR
AG Ratio: 1.6 (calc) (ref 1.0–2.5)
ALT: 25 U/L (ref 9–46)
AST: 29 U/L (ref 10–35)
Albumin: 4.2 g/dL (ref 3.6–5.1)
Alkaline phosphatase (APISO): 70 U/L (ref 40–115)
BUN: 17 mg/dL (ref 7–25)
CO2: 32 mmol/L (ref 20–32)
Calcium: 9.9 mg/dL (ref 8.6–10.3)
Chloride: 99 mmol/L (ref 98–110)
Creat: 1.15 mg/dL (ref 0.70–1.25)
GFR, Est African American: 77 mL/min/{1.73_m2} (ref >=60)
GFR, Est Non African American: 66 mL/min/{1.73_m2} (ref >=60)
Globulin: 2.7 g/dL (calc) (ref 1.9–3.7)
Glucose, Bld: 93 mg/dL (ref 65–99)
Potassium: 4.4 mmol/L (ref 3.5–5.3)
Sodium: 138 mmol/L (ref 135–146)
Total Bilirubin: 0.5 mg/dL (ref 0.2–1.2)
Total Protein: 6.9 g/dL (ref 6.1–8.1)

## 2017-06-27 ENCOUNTER — Ambulatory Visit: Payer: Medicare Other | Admitting: Rheumatology

## 2017-07-03 DIAGNOSIS — M5416 Radiculopathy, lumbar region: Secondary | ICD-10-CM | POA: Diagnosis not present

## 2017-07-04 ENCOUNTER — Other Ambulatory Visit (HOSPITAL_BASED_OUTPATIENT_CLINIC_OR_DEPARTMENT_OTHER): Payer: Self-pay | Admitting: Internal Medicine

## 2017-07-04 DIAGNOSIS — M5410 Radiculopathy, site unspecified: Secondary | ICD-10-CM

## 2017-07-07 ENCOUNTER — Ambulatory Visit (HOSPITAL_BASED_OUTPATIENT_CLINIC_OR_DEPARTMENT_OTHER)
Admission: RE | Admit: 2017-07-07 | Discharge: 2017-07-07 | Disposition: A | Discharge status: Home / Self Care | Payer: Medicare Other | Source: Ambulatory Visit | Attending: Internal Medicine | Admitting: Internal Medicine

## 2017-07-07 DIAGNOSIS — M5116 Intervertebral disc disorders with radiculopathy, lumbar region: Secondary | ICD-10-CM | POA: Insufficient documentation

## 2017-07-07 DIAGNOSIS — M5410 Radiculopathy, site unspecified: Secondary | ICD-10-CM

## 2017-07-07 DIAGNOSIS — M48061 Spinal stenosis, lumbar region without neurogenic claudication: Secondary | ICD-10-CM | POA: Insufficient documentation

## 2017-07-07 DIAGNOSIS — M5416 Radiculopathy, lumbar region: Secondary | ICD-10-CM | POA: Diagnosis present

## 2017-08-09 NOTE — Progress Notes (Deleted)
Office Visit Note  Patient: David [name]             Date of Birth: [birth year]           MRN: [id number]             PCP: Orpah Melter, MD Referring: Orpah Melter, MD Visit Date: 08/22/2017 Occupation: @GUAROCC @    Subjective:  No chief complaint on file.   History of Present Illness: David [name] is a 67 y.o. male ***   Activities of Daily Living:  Patient reports morning stiffness for *** {minute/hour:19697}.   Patient {ACTIONS;DENIES/REPORTS:21021675::"Denies"} nocturnal pain.  Difficulty dressing/grooming: {ACTIONS;DENIES/REPORTS:21021675::"Denies"} Difficulty climbing stairs: {ACTIONS;DENIES/REPORTS:21021675::"Denies"} Difficulty getting out of chair: {ACTIONS;DENIES/REPORTS:21021675::"Denies"} Difficulty using hands for taps, buttons, cutlery, and/or writing: {ACTIONS;DENIES/REPORTS:21021675::"Denies"}   No Rheumatology ROS completed.   PMFS History:  Patient Active Problem List   Diagnosis Date Noted  . Trochanteric bursitis of left hip 01/18/2017  . Lateral epicondylitis, right elbow 01/18/2017  . Rheumatoid arthritis ([place]) 03/15/2016  . DJD (degenerative joint disease), cervical 03/15/2016  . DDD (degenerative disc disease), lumbar 03/15/2016  . HTN (hypertension) 03/15/2016  . Obesity 03/15/2016  . Elevated cholesterol 03/15/2016  . Neuralgia 03/15/2016  . Malignant melanoma of right side of neck ([place]) 03/15/2016  . BBB (bundle branch block) 03/15/2016  . High risk medication use 03/15/2016  . Osteoarthritis of lumbar spine 03/15/2016  . Primary osteoarthritis of both hands 03/15/2016  . Primary osteoarthritis of both knees 03/15/2016  . Mesenteric adenitis 06/29/2015    Past Medical History:  Diagnosis Date  . Arthritis   . BBB (bundle branch block) 03/15/2016  . Carpal tunnel syndrome   . DDD (degenerative disc disease), lumbar 03/15/2016  . Depression   . Dysrhythmia    had some tachycardia with steroids  . Elevated cholesterol  03/15/2016  . GERD (gastroesophageal reflux disease)    no meds since gastric banding  . Headache(784.0)    Ocular Migraines - takes Ambien for it  . HTN (hypertension) 03/15/2016  . Hyperlipemia   . Hypertension   . Malignant melanoma of right side of neck ([place]) 03/15/2016   30 years ago   . Neuralgia 03/15/2016  . Rheumatoid arthritis ([place]) 03/15/2016   Sero Negative.   Marland Kitchen RLS (restless legs syndrome)   . Shortness of breath    DOE  . Sleep apnea    could not use a cpap  . Snores   . Status post gastric banding   . Wears glasses    driving    Family History  Problem Relation Age of Onset  . Heart disease Mother   . Alzheimer's disease Father    Past Surgical History:  Procedure Laterality Date  . CARPAL TUNNEL RELEASE Right 09/26/2012   Procedure: CARPAL TUNNEL RELEASE;  Surgeon: Tennis Must, MD;  Location: [place];  Service: Orthopedics;  Laterality: Right;  . CARPAL TUNNEL RELEASE Left 10/24/2012   Procedure: CARPAL TUNNEL RELEASE;  Surgeon: Tennis Must, MD;  Location: [place];  Service: Orthopedics;  Laterality: Left;  . EXCISION MELANOMA WITH SENTINEL LYMPH NODE BIOPSY  1984   rt neck with mulpipal nodes and some muscle excised rt neck-shoulder  . KNEE ARTHROPLASTY    . knee replacement    . LAPAROSCOPIC GASTRIC BANDING  03/15/2009  . LAPAROSCOPIC GASTRIC BANDING    . MUSCLE BIOPSY  2004   rt thigh to r/o myocitis  . TONSILLECTOMY    . TOTAL KNEE ARTHROPLASTY  Bilateral 12/05/2010  . TRIGGER FINGER RELEASE Right 09/26/2012   Procedure: RELEASE TRIGGER FINGER/A-1 PULLEY RING AND SMALL ;  Surgeon: Tennis Must, MD;  Location: [place];  Service: Orthopedics;  Laterality: Right;  . TRIGGER FINGER RELEASE Left 10/24/2012   Procedure: RELEASE TRIGGER FINGER/A-1 PULLEY LEFT MIDDLE AND LEFT RING;  Surgeon: Tennis Must, MD;  Location: [place];  Service: Orthopedics;  Laterality: Left;   Social History    Social History Narrative  . Not on file     Objective: Vital Signs: There were no vitals taken for this visit.   Physical Exam   Musculoskeletal Exam: ***  CDAI Exam: No CDAI exam completed.    Investigation: No additional findings. CBC Latest Ref Rng & Units 06/15/2017 03/20/2017 01/23/2017  WBC 3.8 - 10.8 Thousand/uL 7.3 8.3 8.2  Hemoglobin 13.2 - 17.1 g/dL 14.8 15.4 15.1  Hematocrit 38.5 - 50.0 % 43.3 45.4 46.8  Platelets 140 - 400 Thousand/uL 285 313 291   CMP Latest Ref Rng & Units 06/15/2017 03/20/2017 01/23/2017  Glucose 65 - 99 mg/dL 93 100(H) 85  BUN 7 - 25 mg/dL 17 25 17   Creatinine 0.70 - 1.25 mg/dL 1.15 1.12 1.21  Sodium 135 - 146 mmol/L 138 140 141  Potassium 3.5 - 5.3 mmol/L 4.4 4.7 4.3  Chloride 98 - 110 mmol/L 99 105 102  CO2 20 - 32 mmol/L 32 27 28  Calcium 8.6 - 10.3 mg/dL 9.9 10.1 9.6  Total Protein 6.1 - 8.1 g/dL 6.9 6.8 6.8  Total Bilirubin 0.2 - 1.2 mg/dL 0.5 0.3 0.5  Alkaline Phos 40 - 115 U/L - - 65  AST 10 - 35 U/L 29 31 30   ALT 9 - 46 U/L 25 27 24     Imaging: No results found.  Speciality Comments: No specialty comments available.    Procedures:  No procedures performed Allergies: Codeine; Dilaudid [hydromorphone hcl]; Fentanyl; Nucynta [tapentadol]; and Penicillins   Assessment / Plan:     Visit Diagnoses: No diagnosis found.    Orders: No orders of the defined types were placed in this encounter.  No orders of the defined types were placed in this encounter.   Face-to-face time spent with patient was *** minutes. 50% of time was spent in counseling and coordination of care.  Follow-Up Instructions: No follow-ups on file.   Earnestine Mealing, CMA  Note - This record has been created using Editor, commissioning.  Chart creation errors have been sought, but may not always  have been located. Such creation errors do not reflect on  the standard of medical care.

## 2017-08-10 DIAGNOSIS — I10 Essential (primary) hypertension: Secondary | ICD-10-CM | POA: Diagnosis not present

## 2017-08-10 DIAGNOSIS — M48062 Spinal stenosis, lumbar region with neurogenic claudication: Secondary | ICD-10-CM | POA: Diagnosis not present

## 2017-08-10 DIAGNOSIS — M7061 Trochanteric bursitis, right hip: Secondary | ICD-10-CM | POA: Diagnosis not present

## 2017-08-10 DIAGNOSIS — Z6839 Body mass index (BMI) 39.0-39.9, adult: Secondary | ICD-10-CM | POA: Diagnosis not present

## 2017-08-10 DIAGNOSIS — M7062 Trochanteric bursitis, left hip: Secondary | ICD-10-CM | POA: Diagnosis not present

## 2017-08-21 ENCOUNTER — Other Ambulatory Visit: Payer: Self-pay

## 2017-08-21 ENCOUNTER — Inpatient Hospital Stay (HOSPITAL_BASED_OUTPATIENT_CLINIC_OR_DEPARTMENT_OTHER)
Admission: EM | Admit: 2017-08-21 | Discharge: 2017-08-23 | DRG: 390 | Disposition: A | Discharge status: Home / Self Care | Payer: Medicare Other | Attending: Surgery | Admitting: Surgery

## 2017-08-21 ENCOUNTER — Encounter (HOSPITAL_BASED_OUTPATIENT_CLINIC_OR_DEPARTMENT_OTHER): Payer: Self-pay

## 2017-08-21 ENCOUNTER — Emergency Department (HOSPITAL_BASED_OUTPATIENT_CLINIC_OR_DEPARTMENT_OTHER): Payer: Medicare Other

## 2017-08-21 ENCOUNTER — Emergency Department (HOSPITAL_COMMUNITY): Payer: Medicare Other

## 2017-08-21 ENCOUNTER — Inpatient Hospital Stay (HOSPITAL_COMMUNITY): Payer: Medicare Other

## 2017-08-21 DIAGNOSIS — K219 Gastro-esophageal reflux disease without esophagitis: Secondary | ICD-10-CM | POA: Diagnosis present

## 2017-08-21 DIAGNOSIS — Z4682 Encounter for fitting and adjustment of non-vascular catheter: Secondary | ICD-10-CM | POA: Diagnosis not present

## 2017-08-21 DIAGNOSIS — K56600 Partial intestinal obstruction, unspecified as to cause: Secondary | ICD-10-CM | POA: Diagnosis not present

## 2017-08-21 DIAGNOSIS — I10 Essential (primary) hypertension: Secondary | ICD-10-CM | POA: Diagnosis present

## 2017-08-21 DIAGNOSIS — G43B0 Ophthalmoplegic migraine, not intractable: Secondary | ICD-10-CM | POA: Diagnosis present

## 2017-08-21 DIAGNOSIS — Z888 Allergy status to other drugs, medicaments and biological substances status: Secondary | ICD-10-CM | POA: Diagnosis not present

## 2017-08-21 DIAGNOSIS — G2581 Restless legs syndrome: Secondary | ICD-10-CM | POA: Diagnosis present

## 2017-08-21 DIAGNOSIS — R112 Nausea with vomiting, unspecified: Secondary | ICD-10-CM | POA: Diagnosis not present

## 2017-08-21 DIAGNOSIS — M0600 Rheumatoid arthritis without rheumatoid factor, unspecified site: Secondary | ICD-10-CM | POA: Diagnosis present

## 2017-08-21 DIAGNOSIS — M5136 Other intervertebral disc degeneration, lumbar region: Secondary | ICD-10-CM | POA: Diagnosis present

## 2017-08-21 DIAGNOSIS — M5030 Other cervical disc degeneration, unspecified cervical region: Secondary | ICD-10-CM | POA: Diagnosis present

## 2017-08-21 DIAGNOSIS — I454 Nonspecific intraventricular block: Secondary | ICD-10-CM | POA: Diagnosis present

## 2017-08-21 DIAGNOSIS — Z0189 Encounter for other specified special examinations: Secondary | ICD-10-CM

## 2017-08-21 DIAGNOSIS — Z87891 Personal history of nicotine dependence: Secondary | ICD-10-CM | POA: Diagnosis not present

## 2017-08-21 DIAGNOSIS — Z96653 Presence of artificial knee joint, bilateral: Secondary | ICD-10-CM | POA: Diagnosis present

## 2017-08-21 DIAGNOSIS — Z85820 Personal history of malignant melanoma of skin: Secondary | ICD-10-CM

## 2017-08-21 DIAGNOSIS — K56609 Unspecified intestinal obstruction, unspecified as to partial versus complete obstruction: Secondary | ICD-10-CM | POA: Diagnosis present

## 2017-08-21 DIAGNOSIS — E785 Hyperlipidemia, unspecified: Secondary | ICD-10-CM | POA: Diagnosis present

## 2017-08-21 DIAGNOSIS — Z880 Allergy status to penicillin: Secondary | ICD-10-CM | POA: Diagnosis not present

## 2017-08-21 DIAGNOSIS — Z9884 Bariatric surgery status: Secondary | ICD-10-CM

## 2017-08-21 DIAGNOSIS — Z885 Allergy status to narcotic agent status: Secondary | ICD-10-CM

## 2017-08-21 DIAGNOSIS — E7800 Pure hypercholesterolemia, unspecified: Secondary | ICD-10-CM | POA: Diagnosis present

## 2017-08-21 DIAGNOSIS — R1013 Epigastric pain: Secondary | ICD-10-CM | POA: Diagnosis not present

## 2017-08-21 DIAGNOSIS — G4733 Obstructive sleep apnea (adult) (pediatric): Secondary | ICD-10-CM | POA: Diagnosis present

## 2017-08-21 DIAGNOSIS — Z8249 Family history of ischemic heart disease and other diseases of the circulatory system: Secondary | ICD-10-CM | POA: Diagnosis not present

## 2017-08-21 DIAGNOSIS — K8020 Calculus of gallbladder without cholecystitis without obstruction: Secondary | ICD-10-CM | POA: Diagnosis not present

## 2017-08-21 DIAGNOSIS — Z820 Family history of epilepsy and other diseases of the nervous system: Secondary | ICD-10-CM

## 2017-08-21 DIAGNOSIS — F329 Major depressive disorder, single episode, unspecified: Secondary | ICD-10-CM | POA: Diagnosis present

## 2017-08-21 LAB — URINALYSIS, ROUTINE W REFLEX MICROSCOPIC
Bilirubin Urine: NEGATIVE
Glucose, UA: NEGATIVE mg/dL
Hgb urine dipstick: NEGATIVE
Ketones, ur: NEGATIVE mg/dL
Leukocytes, UA: NEGATIVE
Nitrite: NEGATIVE
Protein, ur: NEGATIVE mg/dL
Specific Gravity, Urine: 1.02 (ref 1.005–1.030)
pH: 6 (ref 5.0–8.0)

## 2017-08-21 LAB — CBC
HCT: 47.5 % (ref 39.0–52.0)
Hemoglobin: 16.1 g/dL (ref 13.0–17.0)
MCH: 31.7 pg (ref 26.0–34.0)
MCHC: 33.9 g/dL (ref 30.0–36.0)
MCV: 93.5 fL (ref 78.0–100.0)
Platelets: 317 10*3/uL (ref 150–400)
RBC: 5.08 MIL/uL (ref 4.22–5.81)
RDW: 14.9 % (ref 11.5–15.5)
WBC: 12.9 10*3/uL — ABNORMAL HIGH (ref 4.0–10.5)

## 2017-08-21 LAB — COMPREHENSIVE METABOLIC PANEL
ALT: 29 U/L (ref 17–63)
AST: 36 U/L (ref 15–41)
Albumin: 4.3 g/dL (ref 3.5–5.0)
Alkaline Phosphatase: 69 U/L (ref 38–126)
Anion gap: 11 (ref 5–15)
BUN: 15 mg/dL (ref 6–20)
CO2: 25 mmol/L (ref 22–32)
Calcium: 9.2 mg/dL (ref 8.9–10.3)
Chloride: 100 mmol/L — ABNORMAL LOW (ref 101–111)
Creatinine, Ser: 1.03 mg/dL (ref 0.61–1.24)
GFR calc Af Amer: >60 mL/min (ref >60)
GFR calc non Af Amer: >60 mL/min (ref >60)
Glucose, Bld: 133 mg/dL — ABNORMAL HIGH (ref 65–99)
Potassium: 3.5 mmol/L (ref 3.5–5.1)
Sodium: 136 mmol/L (ref 135–145)
Total Bilirubin: 0.7 mg/dL (ref 0.3–1.2)
Total Protein: 7.3 g/dL (ref 6.5–8.1)

## 2017-08-21 LAB — LIPASE, BLOOD: Lipase: 37 U/L (ref 11–51)

## 2017-08-21 MED ORDER — IOPAMIDOL (ISOVUE-300) INJECTION 61%
125.0000 mL | Freq: Once | INTRAVENOUS | Status: AC | PRN
  Administered 2017-08-21: 125 mL via INTRAVENOUS

## 2017-08-21 MED ORDER — ONDANSETRON 4 MG PO TBDP: 4.0000 mg | ORAL_TABLET | Freq: Four times a day (QID) | ORAL | Status: DC | PRN

## 2017-08-21 MED ORDER — MORPHINE SULFATE (PF) 4 MG/ML IV SOLN
4.0000 mg | Freq: Once | INTRAVENOUS | Status: AC
  Administered 2017-08-21: 4 mg via INTRAVENOUS
  Filled 2017-08-21: qty 1

## 2017-08-21 MED ORDER — DIPHENHYDRAMINE HCL 50 MG/ML IJ SOLN
25.0000 mg | Freq: Four times a day (QID) | INTRAMUSCULAR | Status: DC | PRN
  Administered 2017-08-21: 25 mg via INTRAVENOUS
  Filled 2017-08-21: qty 1

## 2017-08-21 MED ORDER — METOPROLOL TARTRATE 5 MG/5ML IV SOLN
5.0000 mg | Freq: Three times a day (TID) | INTRAVENOUS | Status: DC
  Administered 2017-08-21 – 2017-08-23 (×6): 5 mg via INTRAVENOUS
  Filled 2017-08-21 (×6): qty 5

## 2017-08-21 MED ORDER — KCL IN DEXTROSE-NACL 20-5-0.45 MEQ/L-%-% IV SOLN
INTRAVENOUS | Status: DC
  Administered 2017-08-21: 1000 mL via INTRAVENOUS
  Administered 2017-08-21: 12:00:00 via INTRAVENOUS
  Filled 2017-08-21 (×4): qty 1000

## 2017-08-21 MED ORDER — ONDANSETRON HCL 4 MG/2ML IJ SOLN
4.0000 mg | Freq: Four times a day (QID) | INTRAMUSCULAR | Status: DC | PRN
  Administered 2017-08-21: 4 mg via INTRAVENOUS
  Filled 2017-08-21: qty 2

## 2017-08-21 MED ORDER — PHENOL 1.4 % MT LIQD
1.0000 | OROMUCOSAL | Status: DC | PRN
  Filled 2017-08-21: qty 177

## 2017-08-21 MED ORDER — HYDRALAZINE HCL 20 MG/ML IJ SOLN: 10.0000 mg | INTRAMUSCULAR | Status: DC | PRN

## 2017-08-21 MED ORDER — MORPHINE SULFATE (PF) 2 MG/ML IV SOLN
1.0000 mg | INTRAVENOUS | Status: DC | PRN
  Administered 2017-08-21 – 2017-08-22 (×3): 2 mg via INTRAVENOUS
  Filled 2017-08-21 (×4): qty 1

## 2017-08-21 MED ORDER — ACETAMINOPHEN 500 MG PO TABS
1000.0000 mg | ORAL_TABLET | Freq: Four times a day (QID) | ORAL | Status: DC | PRN
  Administered 2017-08-21 – 2017-08-22 (×2): 1000 mg via ORAL
  Filled 2017-08-21 (×3): qty 2

## 2017-08-21 MED ORDER — ONDANSETRON HCL 4 MG/2ML IJ SOLN
4.0000 mg | Freq: Once | INTRAMUSCULAR | Status: AC | PRN
  Administered 2017-08-21: 4 mg via INTRAVENOUS
  Filled 2017-08-21: qty 2

## 2017-08-21 MED ORDER — ENOXAPARIN SODIUM 40 MG/0.4ML ~~LOC~~ SOLN
40.0000 mg | SUBCUTANEOUS | Status: DC
  Filled 2017-08-21: qty 0.4

## 2017-08-21 MED ORDER — PANTOPRAZOLE SODIUM 40 MG IV SOLR
40.0000 mg | Freq: Every day | INTRAVENOUS | Status: DC
  Administered 2017-08-21 – 2017-08-22 (×2): 40 mg via INTRAVENOUS
  Filled 2017-08-21 (×2): qty 40

## 2017-08-21 MED ORDER — DIATRIZOATE MEGLUMINE & SODIUM 66-10 % PO SOLN
90.0000 mL | Freq: Once | ORAL | Status: AC
  Administered 2017-08-21: 90 mL via NASOGASTRIC
  Filled 2017-08-21: qty 90

## 2017-08-21 MED ORDER — ONDANSETRON HCL 4 MG/2ML IJ SOLN
4.0000 mg | Freq: Once | INTRAMUSCULAR | Status: AC
  Administered 2017-08-21: 4 mg via INTRAVENOUS
  Filled 2017-08-21: qty 2

## 2017-08-21 MED ORDER — SODIUM CHLORIDE 0.9 % IV BOLUS
1000.0000 mL | Freq: Once | INTRAVENOUS | Status: AC
  Administered 2017-08-21: 1000 mL via INTRAVENOUS

## 2017-08-21 NOTE — H&P (Signed)
David [name] Mar 24, 1951  [id number].    Chief Complaint/Reason for Consult: SBO  HPI:  This is a 67 yo male with a history of HTN, OSA, RA, and obesity s/p lap band placement 7 years ago by Dr. Lucia Gaskins.  The patient states that he last saw Dr. Lucia Gaskins about 7 years ago.  He is unsure if he has any fluid remaining in his band.  He states he is able to eat whatever he wants.  He lost about 70lbs and then regained at least 30lbs or more back.  He has never had any other abdominal surgery.    Yesterday morning, he ate cereal, but then developed nausea and vomiting.  After initially vomiting, he could not vomit anymore.  He just had dry heaves.  His abdominal pain persistently worsened throughout the day.  Nothing helped his symptoms.  He tried drinking ginger ale, but to no avail.  He states he passed a lot of flatus and had a liquid BM yesterday morning, but nothing since.  His wife took him to [place] last night where he had a CT scan that revealed a SBO.  He has since had an NGT placed and transferred to [facility] for admission.  ROS: ROS : Please see HPI, otherwise all other systems are negative.  He does have OSA, but CPAPs do not fit him and therefore he does not wear them.  Family History  Problem Relation Age of Onset  . Heart disease Mother   . Alzheimer's disease Father     Past Medical History:  Diagnosis Date  . Arthritis   . BBB (bundle branch block) 03/15/2016  . Carpal tunnel syndrome   . DDD (degenerative disc disease), lumbar 03/15/2016  . Depression   . Dysrhythmia    had some tachycardia with steroids  . Elevated cholesterol 03/15/2016  . GERD (gastroesophageal reflux disease)    no meds since gastric banding  . Headache(784.0)    Ocular Migraines - takes Ambien for it  . HTN (hypertension) 03/15/2016  . Hyperlipemia   . Hypertension   . Malignant melanoma of right side of neck ([place]) 03/15/2016   30 years ago   . Neuralgia 03/15/2016  . Rheumatoid arthritis  ([place]) 03/15/2016   Sero Negative.   Marland Kitchen RLS (restless legs syndrome)   . Shortness of breath    DOE  . Sleep apnea    could not use a cpap  . Snores   . Status post gastric banding   . Wears glasses    driving    Past Surgical History:  Procedure Laterality Date  . CARPAL TUNNEL RELEASE Right 09/26/2012   Procedure: CARPAL TUNNEL RELEASE;  Surgeon: Tennis Must, MD;  Location: [place];  Service: Orthopedics;  Laterality: Right;  . CARPAL TUNNEL RELEASE Left 10/24/2012   Procedure: CARPAL TUNNEL RELEASE;  Surgeon: Tennis Must, MD;  Location: [place];  Service: Orthopedics;  Laterality: Left;  . EXCISION MELANOMA WITH SENTINEL LYMPH NODE BIOPSY  1984   rt neck with mulpipal nodes and some muscle excised rt neck-shoulder  . KNEE ARTHROPLASTY    . knee replacement    . LAPAROSCOPIC GASTRIC BANDING  03/15/2009  . LAPAROSCOPIC GASTRIC BANDING    . MUSCLE BIOPSY  2004   rt thigh to r/o myocitis  . TONSILLECTOMY    . TOTAL KNEE ARTHROPLASTY Bilateral 12/05/2010  . TRIGGER FINGER RELEASE Right 09/26/2012   Procedure: RELEASE TRIGGER FINGER/A-1 PULLEY  RING AND SMALL ;  Surgeon: Tennis Must, MD;  Location: [place];  Service: Orthopedics;  Laterality: Right;  . TRIGGER FINGER RELEASE Left 10/24/2012   Procedure: RELEASE TRIGGER FINGER/A-1 PULLEY LEFT MIDDLE AND LEFT RING;  Surgeon: Tennis Must, MD;  Location: [place];  Service: Orthopedics;  Laterality: Left;    Social History:  reports that he quit smoking about 15 years ago. His smoking use included cigarettes. He has a 30.00 pack-year smoking history. He has never used smokeless tobacco. He reports that he drinks alcohol. He reports that he does not use drugs.  Allergies:  Allergies  Allergen Reactions  . Codeine Itching  . Dilaudid [Hydromorphone Hcl] Itching  . Fentanyl Itching    Pt states Fentanyl patch causes itching  . Nucynta [Tapentadol]     MENTAL  PROBLEMS  . Penicillins Itching    Has patient had a PCN reaction causing immediate rash, facial/tongue/throat swelling, SOB or lightheadedness with hypotension:unsure childhood reaction Has patient had a PCN reaction causing severe rash involving mucus membranes or skin necrosis:unsure Has patient had a PCN reaction that required hospitalization:No Has patient had a PCN reaction occurring within the last 10 years:NO If all of the above answers are "NO", then may proceed with Cephalosporin use.      (Not in a hospital admission)   Physical Exam: Blood pressure (!) 148/93, pulse 87, temperature 97.9 F (36.6 C), temperature source Oral, resp. rate 16, height '5\' 8"'$  (1.727 m), weight 250 lb (113.4 kg), SpO2 96 %. General: pleasant, morbidly obese white male who is laying in bed in NAD HEENT: head is normocephalic, atraumatic.  Sclera are noninjected.  PERRL.  Ears and nose without any masses or lesions.  NGT is present with minimal nonbilious output.  Mouth is pink and dry Heart: regular, rate, and rhythm.  Normal s1,s2. No obvious murmurs, gallops, or rubs noted.  Palpable radial and pedal pulses bilaterally Lungs: CTAB, no wheezes, rhonchi, or rales noted.  Respiratory effort nonlabored Abd: soft, tender greatest in the upper abdomen over the epigastrium, but no guarding. Morbidly obese, hypoactive BS, no masses, hernias, or organomegaly.  Port palpable in right mid abdomen. MS: all 4 extremities are symmetrical with no cyanosis, clubbing, or edema. Skin: warm and dry with no masses, lesions, or rashes Psych: A&Ox3 with an appropriate affect.   Results for orders placed or performed during the hospital encounter of 08/21/17 (from the past 48 hour(s))  Urinalysis, Routine w reflex microscopic     Status: None   Collection Time: 08/21/17 12:38 AM  Result Value Ref Range   Color, Urine YELLOW YELLOW   APPearance CLEAR CLEAR   Specific Gravity, Urine 1.020 1.005 - 1.030   pH 6.0 5.0 -  8.0   Glucose, UA NEGATIVE NEGATIVE mg/dL   Hgb urine dipstick NEGATIVE NEGATIVE   Bilirubin Urine NEGATIVE NEGATIVE   Ketones, ur NEGATIVE NEGATIVE mg/dL   Protein, ur NEGATIVE NEGATIVE mg/dL   Nitrite NEGATIVE NEGATIVE   Leukocytes, UA NEGATIVE NEGATIVE    Comment: Microscopic not done on urines with negative protein, blood, leukocytes, nitrite, or glucose < 500 mg/dL. Performed at [facility], [address] [place], [place] [address]   Lipase, blood     Status: None   Collection Time: 08/21/17 12:46 AM  Result Value Ref Range   Lipase 37 11 - 51 U/L    Comment: Performed at [facility], [address] [place], [place] [address]  Comprehensive metabolic panel     Status: Abnormal   Collection Time: 08/21/17 12:46 AM  Result Value Ref Range   Sodium 136 135 - 145 mmol/L   Potassium 3.5 3.5 - 5.1 mmol/L   Chloride 100 (L) 101 - 111 mmol/L   CO2 25 22 - 32 mmol/L   Glucose, Bld 133 (H) 65 - 99 mg/dL   BUN 15 6 - 20 mg/dL   Creatinine, Ser 1.03 0.61 - 1.24 mg/dL   Calcium 9.2 8.9 - 10.3 mg/dL   Total Protein 7.3 6.5 - 8.1 g/dL   Albumin 4.3 3.5 - 5.0 g/dL   AST 36 15 - 41 U/L   ALT 29 17 - 63 U/L   Alkaline Phosphatase 69 38 - 126 U/L   Total Bilirubin 0.7 0.3 - 1.2 mg/dL   GFR calc non Af Amer >60 >60 mL/min   GFR calc Af Amer >60 >60 mL/min    Comment: (NOTE) The eGFR has been calculated using the CKD EPI equation. This calculation has not been validated in all clinical situations. eGFR's persistently <60 mL/min signify possible Chronic Kidney Disease.    Anion gap 11 5 - 15    Comment: Performed at [facility], [address] [place], [place] [address]  CBC     Status: Abnormal   Collection Time: 08/21/17 12:46 AM  Result Value Ref Range   WBC 12.9 (H) 4.0 - 10.5 K/uL   RBC 5.08 4.22 - 5.81 MIL/uL   Hemoglobin 16.1 13.0 - 17.0 g/dL   HCT 47.5 39.0 - 52.0 %   MCV 93.5 78.0 - 100.0 fL   MCH 31.7 26.0 - 34.0 pg   MCHC 33.9 30.0  - 36.0 g/dL   RDW 14.9 11.5 - 15.5 %   Platelets 317 150 - 400 K/uL    Comment: Performed at [facility], [address], [place], [place] [address]   Dg Abdomen 1 View  Result Date: 08/21/2017 CLINICAL DATA:  67 year old male status post NG tube placement. EXAM: ABDOMEN - 1 VIEW COMPARISON:  Abdominal CT dated 08/21/2017 FINDINGS: An enteric tube is partially visualized with tip in the left upper abdomen, in the proximal stomach. The side port of the tube is in the distal esophagus proximal to the lap band. Recommend further advancing of the tube into the stomach. A lap band tubing is noted. Dilated loops of small bowel measure up to 5 cm. IMPRESSION: Enteric tube tip in the proximal stomach. Recommend advancing the tube further into the stomach. Persistent dilatation of small-bowel loops. Electronically Signed   By: Anner Crete M.D.   On: 08/21/2017 05:58   Ct Abdomen Pelvis W Contrast  Result Date: 08/21/2017 CLINICAL DATA:  Periumbilical pain for 8 hours EXAM: CT ABDOMEN AND PELVIS WITH CONTRAST TECHNIQUE: Multidetector CT imaging of the abdomen and pelvis was performed using the standard protocol following bolus administration of intravenous contrast. CONTRAST:  174m ISOVUE-300 IOPAMIDOL (ISOVUE-300) INJECTION 61% COMPARISON:  CT 06/29/2015 FINDINGS: Lower chest: Lung bases demonstrate no acute consolidation or pleural effusion. Normal heart size. Hepatobiliary: No focal hepatic abnormality. Multiple calcified gallstones. No gallbladder wall thickening. No biliary dilatation. Pancreas: Unremarkable. No pancreatic ductal dilatation or surrounding inflammatory changes. Spleen: Normal in size without focal abnormality. Adrenals/Urinary Tract: Adrenal glands are unremarkable. Kidneys are normal, without renal calculi, focal lesion, or hydronephrosis. Bladder is unremarkable. Stomach/Bowel: Status post gastric banding. Moderate dilated stomach. Multiple loops of dilated proximal to  mid  small bowel. Transition to decompressed distal small bowel in the right anterior lower abdomen, slightly to the right of midline, near the level of the umbilicus. Colon is decompressed. Appendix is normal. No colon wall thickening. Vascular/Lymphatic: Mild aortic atherosclerosis. No aneurysmal dilatation. No significantly enlarged lymph nodes Reproductive: Prostate is unremarkable. Other: Negative for free air or free fluid. Small fat in the umbilical region. Musculoskeletal: Degenerative changes. No acute or suspicious bone lesion IMPRESSION: 1. Status post gastric banding. Moderate dilatation of the stomach with multiple fluid-filled dilated loops of proximal to mid small bowel with transition to decompressed distal small bowel in the right anterior lower abdomen; findings are consistent with mechanical small bowel obstruction, likely due to adhesions. 2. Negative appendix 3. Multiple gallstones Electronically Signed   By: Donavan Foil M.D.   On: 08/21/2017 02:13   Dg Chest Portable 1 View  Result Date: 08/21/2017 CLINICAL DATA:  Nasogastric tube placement. EXAM: PORTABLE CHEST 1 VIEW COMPARISON:  Chest radiograph September 15, 2013 FINDINGS: Cardiac silhouette is at least mildly enlarged. Tortuous aorta assisted with chronic hypertension. Bibasilar strandy densities without pleural effusion or focal consolidation. Nasogastric tube past GE junction, distal tip out of field-of-view. Soft tissue planes and included osseous structures are nonsuspicious. IMPRESSION: Nasogastric tube past GE junction, distal tip out of field-of-view. Stable cardiomegaly.  Bibasilar atelectasis/scarring. Electronically Signed   By: Elon Alas M.D.   On: 08/21/2017 04:25      Assessment/Plan SBO The patient has evidence of a SBO on CT scan.  He has a history of lap band placed 7 years ago.  It seems as though he has never followed up and likely has minimal fluid if any in his band.  His CT suggests that his obstruction is  not related to his band but likely adhesive disease in his RLQ.  He will be admitted with his NGT and we will start the SBO protocol.  He is aware that he may resolved with conservative measures, but if he doesn't he may require surgical intervention.  We will also have one of the bariatric surgeons review his imaging just to make sure there are no other concerning issues seen with his band.  He will be NPO x minimal ice.  IS to bedside.  Mobilize TID in halls with NGT clamped.  HTN - hydralazine prn, BP good right now RA - hold home meds OSA - no CPAP at home.  Monitor for now Morbid obesity - h/o lap band surgery.    FEN - NPO/IVFs 100cc VTE - Lovenox, SCDs ID - none Foley - none  Henreitta Cea, [facility] 08/21/2017, 8:14 AM Pager: [phone]

## 2017-08-21 NOTE — ED Notes (Signed)
ED TO INPATIENT HANDOFF REPORT  Name/Age/Gender David [name] 67 y.o. male  Code Status Code Status History    Date Active Date Inactive Code Status Order ID Comments User Context   06/29/2015 0759 07/02/2015 1623 Full Code [id number]  Jackolyn Confer, MD ED      Home/SNF/Other Home  Chief Complaint stomachache..n/v  Level of Care/Admitting Diagnosis ED Disposition    ED Disposition Condition Comment   Admit  Hospital Area: [place] [[id number]]  Level of Care: Med-Surg [16]  Diagnosis: SBO (small bowel obstruction) ([place]) [800349]  Admitting Physician: [place], [place]  Attending Physician: CCS, MD [[id number]]  Estimated length of stay: 3 - 4 days  Certification:: I certify this patient will need inpatient services for at least 2 midnights  Bed request comments: 5W  PT Class (Do Not Modify): Inpatient [101]  PT Acc Code (Do Not Modify): Private [1]       Medical History Past Medical History:  Diagnosis Date  . Arthritis   . BBB (bundle branch block) 03/15/2016  . Carpal tunnel syndrome   . DDD (degenerative disc disease), lumbar 03/15/2016  . Depression   . Dysrhythmia    had some tachycardia with steroids  . Elevated cholesterol 03/15/2016  . GERD (gastroesophageal reflux disease)    no meds since gastric banding  . Headache(784.0)    Ocular Migraines - takes Ambien for it  . HTN (hypertension) 03/15/2016  . Hyperlipemia   . Hypertension   . Malignant melanoma of right side of neck ([place]) 03/15/2016   30 years ago   . Neuralgia 03/15/2016  . Rheumatoid arthritis ([place]) 03/15/2016   Sero Negative.   Marland Kitchen RLS (restless legs syndrome)   . Shortness of breath    DOE  . Sleep apnea    could not use a cpap  . Snores   . Status post gastric banding   . Wears glasses    driving    Allergies Allergies  Allergen Reactions  . Codeine Itching  . Dilaudid [Hydromorphone Hcl] Itching  . Fentanyl Itching    Pt states Fentanyl patch causes itching   . Nucynta [Tapentadol]     MENTAL PROBLEMS  . Penicillins Itching    Has patient had a PCN reaction causing immediate rash, facial/tongue/throat swelling, SOB or lightheadedness with hypotension:unsure childhood reaction Has patient had a PCN reaction causing severe rash involving mucus membranes or skin necrosis:unsure Has patient had a PCN reaction that required hospitalization:No Has patient had a PCN reaction occurring within the last 10 years:NO If all of the above answers are "NO", then may proceed with Cephalosporin use.     IV Location/Drains/Wounds Patient Lines/Drains/Airways Status   Active Line/Drains/Airways    Name:   Placement date:   Placement time:   Site:   Days:   Peripheral IV 08/21/17 Left Antecubital   08/21/17    0052    Antecubital   less than 1   Peripheral IV 08/21/17 Right Antecubital   08/21/17    0338    Antecubital   less than 1   NG/OG Tube Nasogastric 14 Fr. Left nare Aucultation Documented cm marking at nare/ corner of mouth   08/21/17    0335    Left nare   less than 1          Labs/Imaging Results for orders placed or performed during the hospital encounter of 08/21/17 (from the past 48 hour(s))  Urinalysis, Routine w reflex microscopic     Status:  None   Collection Time: 08/21/17 12:38 AM  Result Value Ref Range   Color, Urine YELLOW YELLOW   APPearance CLEAR CLEAR   Specific Gravity, Urine 1.020 1.005 - 1.030   pH 6.0 5.0 - 8.0   Glucose, UA NEGATIVE NEGATIVE mg/dL   Hgb urine dipstick NEGATIVE NEGATIVE   Bilirubin Urine NEGATIVE NEGATIVE   Ketones, ur NEGATIVE NEGATIVE mg/dL   Protein, ur NEGATIVE NEGATIVE mg/dL   Nitrite NEGATIVE NEGATIVE   Leukocytes, UA NEGATIVE NEGATIVE    Comment: Microscopic not done on urines with negative protein, blood, leukocytes, nitrite, or glucose < 500 mg/dL. Performed at [facility], [address] [place], [place] [address]   Lipase, blood     Status: None   Collection Time: 08/21/17 12:46  AM  Result Value Ref Range   Lipase 37 11 - 51 U/L    Comment: Performed at [facility], [address] [place], [place] [address]  Comprehensive metabolic panel     Status: Abnormal   Collection Time: 08/21/17 12:46 AM  Result Value Ref Range   Sodium 136 135 - 145 mmol/L   Potassium 3.5 3.5 - 5.1 mmol/L   Chloride 100 (L) 101 - 111 mmol/L   CO2 25 22 - 32 mmol/L   Glucose, Bld 133 (H) 65 - 99 mg/dL   BUN 15 6 - 20 mg/dL   Creatinine, Ser 1.03 0.61 - 1.24 mg/dL   Calcium 9.2 8.9 - 10.3 mg/dL   Total Protein 7.3 6.5 - 8.1 g/dL   Albumin 4.3 3.5 - 5.0 g/dL   AST 36 15 - 41 U/L   ALT 29 17 - 63 U/L   Alkaline Phosphatase 69 38 - 126 U/L   Total Bilirubin 0.7 0.3 - 1.2 mg/dL   GFR calc non Af Amer >60 >60 mL/min   GFR calc Af Amer >60 >60 mL/min    Comment: (NOTE) The eGFR has been calculated using the CKD EPI equation. This calculation has not been validated in all clinical situations. eGFR's persistently <60 mL/min signify possible Chronic Kidney Disease.    Anion gap 11 5 - 15    Comment: Performed at [facility], [address] [place], [place] [address]  CBC     Status: Abnormal   Collection Time: 08/21/17 12:46 AM  Result Value Ref Range   WBC 12.9 (H) 4.0 - 10.5 K/uL   RBC 5.08 4.22 - 5.81 MIL/uL   Hemoglobin 16.1 13.0 - 17.0 g/dL   HCT 47.5 39.0 - 52.0 %   MCV 93.5 78.0 - 100.0 fL   MCH 31.7 26.0 - 34.0 pg   MCHC 33.9 30.0 - 36.0 g/dL   RDW 14.9 11.5 - 15.5 %   Platelets 317 150 - 400 K/uL    Comment: Performed at [facility], [address] [place], [place] [address]   Dg Abdomen 1 View  Result Date: 08/21/2017 CLINICAL DATA:  67 year old male status post NG tube placement. EXAM: ABDOMEN - 1 VIEW COMPARISON:  Abdominal CT dated 08/21/2017 FINDINGS: An enteric tube is partially visualized with tip in the left upper abdomen, in the proximal stomach. The side port of the tube is in the distal esophagus proximal to the lap band.  Recommend further advancing of the tube into the stomach. A lap band tubing is noted. Dilated loops of small bowel measure up to 5 cm. IMPRESSION: Enteric tube tip in the proximal stomach. Recommend  advancing the tube further into the stomach. Persistent dilatation of small-bowel loops. Electronically Signed   By: Anner Crete M.D.   On: 08/21/2017 05:58   Ct Abdomen Pelvis W Contrast  Result Date: 08/21/2017 CLINICAL DATA:  Periumbilical pain for 8 hours EXAM: CT ABDOMEN AND PELVIS WITH CONTRAST TECHNIQUE: Multidetector CT imaging of the abdomen and pelvis was performed using the standard protocol following bolus administration of intravenous contrast. CONTRAST:  144m ISOVUE-300 IOPAMIDOL (ISOVUE-300) INJECTION 61% COMPARISON:  CT 06/29/2015 FINDINGS: Lower chest: Lung bases demonstrate no acute consolidation or pleural effusion. Normal heart size. Hepatobiliary: No focal hepatic abnormality. Multiple calcified gallstones. No gallbladder wall thickening. No biliary dilatation. Pancreas: Unremarkable. No pancreatic ductal dilatation or surrounding inflammatory changes. Spleen: Normal in size without focal abnormality. Adrenals/Urinary Tract: Adrenal glands are unremarkable. Kidneys are normal, without renal calculi, focal lesion, or hydronephrosis. Bladder is unremarkable. Stomach/Bowel: Status post gastric banding. Moderate dilated stomach. Multiple loops of dilated proximal to mid small bowel. Transition to decompressed distal small bowel in the right anterior lower abdomen, slightly to the right of midline, near the level of the umbilicus. Colon is decompressed. Appendix is normal. No colon wall thickening. Vascular/Lymphatic: Mild aortic atherosclerosis. No aneurysmal dilatation. No significantly enlarged lymph nodes Reproductive: Prostate is unremarkable. Other: Negative for free air or free fluid. Small fat in the umbilical region. Musculoskeletal: Degenerative changes. No acute or suspicious bone  lesion IMPRESSION: 1. Status post gastric banding. Moderate dilatation of the stomach with multiple fluid-filled dilated loops of proximal to mid small bowel with transition to decompressed distal small bowel in the right anterior lower abdomen; findings are consistent with mechanical small bowel obstruction, likely due to adhesions. 2. Negative appendix 3. Multiple gallstones Electronically Signed   By: KDonavan FoilM.D.   On: 08/21/2017 02:13   Dg Chest Portable 1 View  Result Date: 08/21/2017 CLINICAL DATA:  Nasogastric tube placement. EXAM: PORTABLE CHEST 1 VIEW COMPARISON:  Chest radiograph September 15, 2013 FINDINGS: Cardiac silhouette is at least mildly enlarged. Tortuous aorta assisted with chronic hypertension. Bibasilar strandy densities without pleural effusion or focal consolidation. Nasogastric tube past GE junction, distal tip out of field-of-view. Soft tissue planes and included osseous structures are nonsuspicious. IMPRESSION: Nasogastric tube past GE junction, distal tip out of field-of-view. Stable cardiomegaly.  Bibasilar atelectasis/scarring. Electronically Signed   By: CElon AlasM.D.   On: 08/21/2017 04:25    Pending Labs [employer](From admission, onward)   Start     Ordered   Signed and Held  HIV antibody (Routine Testing)  Once,   R     Signed and Held   Signed and Held  Creatinine, serum  (enoxaparin (LOVENOX)    CrCl >/= 30 ml/min)  Weekly,   R    Comments:  while on enoxaparin therapy    Signed and Held   Signed and Held  Basic metabolic panel  Tomorrow morning,   R     Signed and Held   Signed and Held  CBC  Tomorrow morning,   R     Signed and Held      Vitals/Pain Today's Vitals   08/21/17 0800 08/21/17 0826 08/21/17 0826 08/21/17 0930  BP: 124/81   (!) 144/81  Pulse: 84   90  Resp: 16   18  Temp:      TempSrc:      SpO2: 95%   96%  Weight:      Height:      PainSc:  0-No pain 0-No pain     Isolation Precautions No active  isolations  Medications Medications  ondansetron (ZOFRAN) injection 4 mg (4 mg Intravenous Given 08/21/17 0216)  sodium chloride 0.9 % bolus 1,000 mL (0 mLs Intravenous Stopped 08/21/17 0151)  ondansetron (ZOFRAN) injection 4 mg (4 mg Intravenous Given 08/21/17 0057)  morphine 4 MG/ML injection 4 mg (4 mg Intravenous Given 08/21/17 0058)  iopamidol (ISOVUE-300) 61 % injection 125 mL (125 mLs Intravenous Contrast Given 08/21/17 0126)  morphine 4 MG/ML injection 4 mg (4 mg Intravenous Given 08/21/17 0216)  morphine 4 MG/ML injection 4 mg (4 mg Intravenous Given 08/21/17 0302)  morphine 4 MG/ML injection 4 mg (4 mg Intravenous Given 08/21/17 0640)    Mobility walks

## 2017-08-21 NOTE — ED Provider Notes (Signed)
[facility] EMERGENCY DEPARTMENT Provider Note   CSN: [id number] Arrival date & time: 08/21/17  0019     History   Chief Complaint Chief Complaint  Patient presents with  . Abdominal Pain    HPI David [name] is a 67 y.o. male.  This patient is a 67 year old male with past medical history of hypertension, GERD, obesity, status post gastric sleeve 8 years ago.  He presents today for evaluation of abdominal pain.  This started earlier this evening in the absence of any injury or trauma.  He has vomited once.  Earlier today he was passing flatus, however he states that this has stopped.  His last bowel movement was this morning and was normal.  He denies any fevers or chills.  He denies any urinary complaints.  The history is provided by the patient.  Abdominal Pain   This is a new problem. Episode onset: This afternoon. The problem occurs constantly. The problem has been rapidly worsening. The pain is associated with an unknown factor. The pain is located in the epigastric region. The quality of the pain is cramping. The pain is severe. Associated symptoms include nausea and vomiting. Pertinent negatives include fever, melena, constipation, dysuria and frequency. Nothing aggravates the symptoms. Nothing relieves the symptoms.    Past Medical History:  Diagnosis Date  . Arthritis   . BBB (bundle branch block) 03/15/2016  . Carpal tunnel syndrome   . DDD (degenerative disc disease), lumbar 03/15/2016  . Depression   . Dysrhythmia    had some tachycardia with steroids  . Elevated cholesterol 03/15/2016  . GERD (gastroesophageal reflux disease)    no meds since gastric banding  . Headache(784.0)    Ocular Migraines - takes Ambien for it  . HTN (hypertension) 03/15/2016  . Hyperlipemia   . Hypertension   . Malignant melanoma of right side of neck ([place]) 03/15/2016   30 years ago   . Neuralgia 03/15/2016  . Rheumatoid arthritis ([place]) 03/15/2016   Sero Negative.   Marland Kitchen  RLS (restless legs syndrome)   . Shortness of breath    DOE  . Sleep apnea    could not use a cpap  . Snores   . Status post gastric banding   . Wears glasses    driving    Patient Active Problem List   Diagnosis Date Noted  . Trochanteric bursitis of left hip 01/18/2017  . Lateral epicondylitis, right elbow 01/18/2017  . Rheumatoid arthritis ([place]) 03/15/2016  . DJD (degenerative joint disease), cervical 03/15/2016  . DDD (degenerative disc disease), lumbar 03/15/2016  . HTN (hypertension) 03/15/2016  . Obesity 03/15/2016  . Elevated cholesterol 03/15/2016  . Neuralgia 03/15/2016  . Malignant melanoma of right side of neck ([place]) 03/15/2016  . BBB (bundle branch block) 03/15/2016  . High risk medication use 03/15/2016  . Osteoarthritis of lumbar spine 03/15/2016  . Primary osteoarthritis of both hands 03/15/2016  . Primary osteoarthritis of both knees 03/15/2016  . Mesenteric adenitis 06/29/2015    Past Surgical History:  Procedure Laterality Date  . CARPAL TUNNEL RELEASE Right 09/26/2012   Procedure: CARPAL TUNNEL RELEASE;  Surgeon: Tennis Must, MD;  Location: [facility];  Service: Orthopedics;  Laterality: Right;  . CARPAL TUNNEL RELEASE Left 10/24/2012   Procedure: CARPAL TUNNEL RELEASE;  Surgeon: Tennis Must, MD;  Location: [facility];  Service: Orthopedics;  Laterality: Left;  . EXCISION MELANOMA WITH SENTINEL LYMPH NODE BIOPSY  1984   rt neck  with mulpipal nodes and some muscle excised rt neck-shoulder  . KNEE ARTHROPLASTY    . knee replacement    . LAPAROSCOPIC GASTRIC BANDING  03/15/2009  . LAPAROSCOPIC GASTRIC BANDING    . MUSCLE BIOPSY  2004   rt thigh to r/o myocitis  . TONSILLECTOMY    . TOTAL KNEE ARTHROPLASTY Bilateral 12/05/2010  . TRIGGER FINGER RELEASE Right 09/26/2012   Procedure: RELEASE TRIGGER FINGER/A-1 PULLEY RING AND SMALL ;  Surgeon: Tennis Must, MD;  Location: [facility];  Service: Orthopedics;   Laterality: Right;  . TRIGGER FINGER RELEASE Left 10/24/2012   Procedure: RELEASE TRIGGER FINGER/A-1 PULLEY LEFT MIDDLE AND LEFT RING;  Surgeon: Tennis Must, MD;  Location: [facility];  Service: Orthopedics;  Laterality: Left;        Home Medications    Prior to Admission medications   Medication Sig Start Date End Date Taking? Authorizing Provider  diclofenac sodium (VOLTAREN) 1 % GEL Voltaren Gel 3 grams to 3 large joints upto TID 3 TUBES with 3 refills 03/20/17   Panwala, Naitik, PA-C  DULoxetine (CYMBALTA) 60 MG capsule Take 60 mg by mouth 2 (two) times daily.     [provider]  folic acid (FOLVITE) 1 MG tablet Take 2 tablets (2 mg total) by mouth daily. 03/20/17 06/13/18  Panwala, Naitik, PA-C  gabapentin (NEURONTIN) 300 MG capsule Take 600 mg by mouth. 07/11/16   [provider]  hydrochlorothiazide (HYDRODIURIL) 25 MG tablet Take 25 mg by mouth daily.    [provider]  HYDROcodone-acetaminophen (NORCO) 10-325 MG tablet Take 1 tablet by mouth every 6 (six) hours as needed for moderate pain.    [provider]  hydroxychloroquine (PLAQUENIL) 200 MG tablet Take 200 mg by mouth 2 (two) times daily. 04/15/15   [provider]  hydroxychloroquine (PLAQUENIL) 200 MG tablet Take 1 tablet (200 mg total) by mouth 2 (two) times daily. 03/20/17 09/16/17  Panwala, Naitik, PA-C  ibuprofen (ADVIL,MOTRIN) 200 MG tablet Take 400 mg by mouth every 6 (six) hours as needed (for pain.).    [provider]  methotrexate 50 MG/2ML injection Inject 1 mL (25 mg total) into the skin once a week. 03/20/17 06/18/17  Panwala, Naitik, PA-C  metoprolol succinate (TOPROL-XL) 25 MG 24 hr tablet Take 25 mg by mouth daily. 11/24/14   [provider]  morphine (MS CONTIN) 15 MG 12 hr tablet Take 30 mg by mouth 2 (two) times daily.     [provider]  MOVANTIK 25 MG TABS tablet Take 25 mg by mouth daily. 06/11/15   [provider]  Multiple Vitamins-Minerals (MULTIVITAMIN WITH MINERALS) tablet Take 1 tablet by mouth daily.     [provider]  psyllium (METAMUCIL) 58.6 % powder Take 1 packet by mouth as needed.     [provider]  rOPINIRole (REQUIP) 4 MG tablet Take 4 mg by mouth every evening.     [provider]  simvastatin (ZOCOR) 40 MG tablet Take 40 mg by mouth daily.    [provider]  sulfaSALAzine (AZULFIDINE) 500 MG EC tablet TAKE 2 TABLETS BY MOUTH TWICE DAILY 05/28/17   Bo Merino, MD  tamsulosin (FLOMAX) 0.4 MG CAPS capsule Take 0.4 mg by mouth.    [provider]  Tuberculin-Allergy Syringes 27G X 1/2" 1 ML MISC Patient to inject MTX once weekly 12/19/16   Bo Merino, MD  zolpidem (AMBIEN) 10 MG tablet Take 10 mg by mouth  at bedtime as needed for sleep.    [provider]    Family History Family History  Problem Relation Age of Onset  . Heart disease Mother   . Alzheimer's disease Father     Social History Social History   Tobacco Use  . Smoking status: Former Smoker    Packs/day: 1.00    Years: 30.00    Pack years: 30.00    Types: Cigarettes    Last attempt to quit: 09/23/2001    Years since quitting: 15.9  . Smokeless tobacco: Never Used  Substance Use Topics  . Alcohol use: Yes    Comment: rare  . Drug use: No     Allergies   Codeine; Dilaudid [hydromorphone hcl]; Fentanyl; Nucynta [tapentadol]; and Penicillins   Review of Systems Review of Systems  Constitutional: Negative for fever.  Gastrointestinal: Positive for abdominal pain, nausea and vomiting. Negative for constipation and melena.  Genitourinary: Negative for dysuria and frequency.  All other systems reviewed and are negative.    Physical Exam Updated Vital Signs BP (!) 167/99 (BP Location: Right Arm)   Pulse 83   Temp 98.1 F (36.7 C) (Oral)   Resp 18   Ht 5\' 8"  (1.727 m)   Wt 113.4 kg (250 lb)   SpO2 100%   BMI 38.01 kg/m   Physical  Exam  Constitutional: He is oriented to person, place, and time. He appears well-developed and well-nourished. No distress.  HENT:  Head: Normocephalic and atraumatic.  Mouth/Throat: Oropharynx is clear and moist.  Neck: Normal range of motion. Neck supple.  Cardiovascular: Normal rate and regular rhythm. Exam reveals no friction rub.  No murmur heard. Pulmonary/Chest: Effort normal and breath sounds normal. No respiratory distress. He has no wheezes. He has no rales.  Abdominal: Soft. Bowel sounds are normal. He exhibits no distension. There is tenderness in the right upper quadrant, epigastric area, periumbilical area and left upper quadrant. There is no rigidity, no rebound, no guarding and no CVA tenderness.  Musculoskeletal: Normal range of motion. He exhibits no edema.  Neurological: He is alert and oriented to person, place, and time. Coordination normal.  Skin: Skin is warm and dry. He is not diaphoretic.  Nursing note and vitals reviewed.    ED Treatments / Results  Labs (all labs ordered are listed, but only abnormal results are displayed) Labs Reviewed  URINALYSIS, ROUTINE W REFLEX MICROSCOPIC  LIPASE, BLOOD  COMPREHENSIVE METABOLIC PANEL  CBC    EKG None  Radiology No results found.  Procedures Procedures (including critical care time)  Medications Ordered in ED Medications  ondansetron (ZOFRAN) injection 4 mg (has no administration in time range)  sodium chloride 0.9 % bolus 1,000 mL (has no administration in time range)  ondansetron (ZOFRAN) injection 4 mg (has no administration in time range)  morphine 4 MG/ML injection 4 mg (has no administration in time range)     Initial Impression / Assessment and Plan / ED Course  I have reviewed the triage vital signs and the nursing notes.  Pertinent labs & imaging results that were available during my care of the patient were reviewed by me and considered in my medical decision making (see chart for  details).  Patient presents with acute upper abdominal pain starting earlier today.  He has a history of gastric band placement.  His workup reveals a white count of 13,000 and CT scan shows a small bowel obstruction, likely secondary to adhesions.  He was given IV fluids, pain  and nausea medicines, and an NG tube will be inserted.  I have discussed the case with Dr. Harlow Asa from general surgery who would like the patient to be transferred to [facility] ED for surgical evaluation.  Final Clinical Impressions(s) / ED Diagnoses   Final diagnoses:  None    ED Discharge Orders    None       Veryl Speak, MD 08/21/17 [phone]

## 2017-08-21 NOTE — ED Triage Notes (Signed)
Upper abdominal pain that is radiating to his back, with 5 episodes of vomiting since this afternoon, tried Gas X without relief

## 2017-08-21 NOTE — ED Notes (Signed)
Paged general surgery (Dr. Harlow Asa) for consult.

## 2017-08-21 NOTE — ED Provider Notes (Addendum)
Patient was seen when he arrived with [facility].  He has an NG tube.  He was diagnosed with SBO at [facility] and is being transferred to our ED to be evaluated by Dr. Harlow Asa.   05:10 AM Dr Harlow Asa, will see patient shortly. Wants an abd xray to see where the NG ends.   5:15 AM patient was informed that Dr. Harlow Asa would be seeing him in a little while.  He also was informed about the x-ray that was ordered.  He was offered pain or nausea medication but he states he does not need it at this time.  He states as long as the suction is working on his NG tube he feels comfortable.  Dg Abdomen 1 View  Result Date: 08/21/2017 CLINICAL DATA:  67 year old male status post NG tube placement. EXAM: ABDOMEN - 1 VIEW COMPARISON:  Abdominal CT dated 08/21/2017 FINDINGS: An enteric tube is partially visualized with tip in the left upper abdomen, in the proximal stomach. The side port of the tube is in the distal esophagus proximal to the lap band. Recommend further advancing of the tube into the stomach. A lap band tubing is noted. Dilated loops of small bowel measure up to 5 cm. IMPRESSION: Enteric tube tip in the proximal stomach. Recommend advancing the tube further into the stomach. Persistent dilatation of small-bowel loops. Electronically Signed   By: Anner Crete M.D.   On: 08/21/2017 05:58      Rolland Porter, MD, Barbette Or, MD 08/21/17 [place], [place], MD 08/21/17 [place], [place], MD 08/21/17 [phone]

## 2017-08-21 NOTE — ED Notes (Signed)
Pt reports inability to have a BM here when he visited the bathroom.

## 2017-08-22 ENCOUNTER — Ambulatory Visit: Payer: Medicare Other | Admitting: Rheumatology

## 2017-08-22 LAB — BASIC METABOLIC PANEL
Anion gap: 7 (ref 5–15)
BUN: 12 mg/dL (ref 6–20)
CO2: 27 mmol/L (ref 22–32)
Calcium: 8.9 mg/dL (ref 8.9–10.3)
Chloride: 107 mmol/L (ref 101–111)
Creatinine, Ser: 1.06 mg/dL (ref 0.61–1.24)
GFR calc Af Amer: >60 mL/min (ref >60)
GFR calc non Af Amer: >60 mL/min (ref >60)
Glucose, Bld: 110 mg/dL — ABNORMAL HIGH (ref 65–99)
Potassium: 3.5 mmol/L (ref 3.5–5.1)
Sodium: 141 mmol/L (ref 135–145)

## 2017-08-22 LAB — CBC
HCT: 43.9 % (ref 39.0–52.0)
Hemoglobin: 14.7 g/dL (ref 13.0–17.0)
MCH: 32.2 pg (ref 26.0–34.0)
MCHC: 33.5 g/dL (ref 30.0–36.0)
MCV: 96.1 fL (ref 78.0–100.0)
Platelets: 234 10*3/uL (ref 150–400)
RBC: 4.57 MIL/uL (ref 4.22–5.81)
RDW: 15.1 % (ref 11.5–15.5)
WBC: 8.8 10*3/uL (ref 4.0–10.5)

## 2017-08-22 LAB — HIV ANTIBODY (ROUTINE TESTING W REFLEX): HIV Screen 4th Generation wRfx: NONREACTIVE

## 2017-08-22 MED ORDER — ROPINIROLE HCL 1 MG PO TABS
4.0000 mg | ORAL_TABLET | Freq: Every day | ORAL | Status: DC
  Administered 2017-08-22: 4 mg via ORAL
  Filled 2017-08-22: qty 4

## 2017-08-22 MED ORDER — KETOROLAC TROMETHAMINE 15 MG/ML IJ SOLN
15.0000 mg | Freq: Three times a day (TID) | INTRAMUSCULAR | Status: DC | PRN
  Administered 2017-08-22: 15 mg via INTRAVENOUS
  Filled 2017-08-22 (×2): qty 1

## 2017-08-22 NOTE — Progress Notes (Signed)
[facility] Progress Note     Subjective: CC:  Reports multiple loose bowel movements, some straining to have BM. BMs are loose and non-bloody. Denies N/V. Mobilizing in halls. Denies abdominal pain.  Objective: Vital signs in last 24 hours: Temp:  [98.5 F (36.9 C)-98.8 F (37.1 C)] 98.5 F (36.9 C) (04/03 0517) Pulse Rate:  [83-90] 84 (04/03 0517) Resp:  [16-18] 16 (04/03 0517) BP: (136-151)/(81-96) 137/83 (04/03 0517) SpO2:  [92 %-97 %] 97 % (04/03 0517) Last BM Date: 08/21/17  Intake/Output from previous day: 04/02 0701 - 04/03 0700 In: 1893.3 [I.V.:1773.3; NG/GT:120] Out: 1050 [Urine:1050] Intake/Output this shift: No intake/output data recorded.  PE: Gen:  Alert, NAD, pleasant Card:  Regular rate and rhythm, pedal pulses 2+ BL Pulm:  Normal effort, clear to auscultation bilaterally Abd: Soft, mild TTP epigastrium, some upper abdominal distention, +BS, no guarding, no peritonitis  Skin: warm and dry, no rashes  Psych: A&Ox3   Lab Results:  Recent Labs    08/21/17 0046 08/22/17 0649  WBC 12.9* 8.8  HGB 16.1 14.7  HCT 47.5 43.9  PLT 317 234   BMET Recent Labs    08/21/17 0046 08/22/17 0649  NA 136 141  K 3.5 3.5  CL 100* 107  CO2 25 27  GLUCOSE 133* 110*  BUN 15 12  CREATININE 1.03 1.06  CALCIUM 9.2 8.9   PT/INR No results for input(s): LABPROT, INR in the last 72 hours. CMP     Component Value Date/Time   NA 141 08/22/2017 0649   NA 138 03/03/2016   K 3.5 08/22/2017 0649   CL 107 08/22/2017 0649   CO2 27 08/22/2017 0649   GLUCOSE 110 (H) 08/22/2017 0649   BUN 12 08/22/2017 0649   BUN 18 03/03/2016   CREATININE 1.06 08/22/2017 0649   CREATININE 1.15 06/15/2017 1407   CALCIUM 8.9 08/22/2017 0649   PROT 7.3 08/21/2017 0046   ALBUMIN 4.3 08/21/2017 0046   AST 36 08/21/2017 0046   ALT 29 08/21/2017 0046   ALKPHOS 69 08/21/2017 0046   BILITOT 0.7 08/21/2017 0046   GFRNONAA >60 08/22/2017 0649   GFRNONAA 66 06/15/2017 1407    GFRAA >60 08/22/2017 0649   GFRAA 77 06/15/2017 1407   Lipase     Component Value Date/Time   LIPASE 37 08/21/2017 0046       Studies/Results: Dg Abdomen 1 View  Result Date: 08/21/2017 CLINICAL DATA:  67 year old male status post NG tube placement. EXAM: ABDOMEN - 1 VIEW COMPARISON:  Abdominal CT dated 08/21/2017 FINDINGS: An enteric tube is partially visualized with tip in the left upper abdomen, in the proximal stomach. The side port of the tube is in the distal esophagus proximal to the lap band. Recommend further advancing of the tube into the stomach. A lap band tubing is noted. Dilated loops of small bowel measure up to 5 cm. IMPRESSION: Enteric tube tip in the proximal stomach. Recommend advancing the tube further into the stomach. Persistent dilatation of small-bowel loops. Electronically Signed   By: Anner Crete M.D.   On: 08/21/2017 05:58   Ct Abdomen Pelvis W Contrast  Result Date: 08/21/2017 CLINICAL DATA:  Periumbilical pain for 8 hours EXAM: CT ABDOMEN AND PELVIS WITH CONTRAST TECHNIQUE: Multidetector CT imaging of the abdomen and pelvis was performed using the standard protocol following bolus administration of intravenous contrast. CONTRAST:  151mL ISOVUE-300 IOPAMIDOL (ISOVUE-300) INJECTION 61% COMPARISON:  CT 06/29/2015 FINDINGS: Lower chest: Lung bases demonstrate no acute consolidation or pleural  effusion. Normal heart size. Hepatobiliary: No focal hepatic abnormality. Multiple calcified gallstones. No gallbladder wall thickening. No biliary dilatation. Pancreas: Unremarkable. No pancreatic ductal dilatation or surrounding inflammatory changes. Spleen: Normal in size without focal abnormality. Adrenals/Urinary Tract: Adrenal glands are unremarkable. Kidneys are normal, without renal calculi, focal lesion, or hydronephrosis. Bladder is unremarkable. Stomach/Bowel: Status post gastric banding. Moderate dilated stomach. Multiple loops of dilated proximal to mid small bowel.  Transition to decompressed distal small bowel in the right anterior lower abdomen, slightly to the right of midline, near the level of the umbilicus. Colon is decompressed. Appendix is normal. No colon wall thickening. Vascular/Lymphatic: Mild aortic atherosclerosis. No aneurysmal dilatation. No significantly enlarged lymph nodes Reproductive: Prostate is unremarkable. Other: Negative for free air or free fluid. Small fat in the umbilical region. Musculoskeletal: Degenerative changes. No acute or suspicious bone lesion IMPRESSION: 1. Status post gastric banding. Moderate dilatation of the stomach with multiple fluid-filled dilated loops of proximal to mid small bowel with transition to decompressed distal small bowel in the right anterior lower abdomen; findings are consistent with mechanical small bowel obstruction, likely due to adhesions. 2. Negative appendix 3. Multiple gallstones Electronically Signed   By: Donavan Foil M.D.   On: 08/21/2017 02:13   Dg Chest Portable 1 View  Result Date: 08/21/2017 CLINICAL DATA:  Nasogastric tube placement. EXAM: PORTABLE CHEST 1 VIEW COMPARISON:  Chest radiograph September 15, 2013 FINDINGS: Cardiac silhouette is at least mildly enlarged. Tortuous aorta assisted with chronic hypertension. Bibasilar strandy densities without pleural effusion or focal consolidation. Nasogastric tube past GE junction, distal tip out of field-of-view. Soft tissue planes and included osseous structures are nonsuspicious. IMPRESSION: Nasogastric tube past GE junction, distal tip out of field-of-view. Stable cardiomegaly.  Bibasilar atelectasis/scarring. Electronically Signed   By: Elon Alas M.D.   On: 08/21/2017 04:25   Dg Abd Portable 1v-small Bowel Obstruction Protocol-initial, 8 Hr Delay  Result Date: 08/21/2017 CLINICAL DATA:  Small bowel obstruction protocol, 8 hour delay EXAM: PORTABLE ABDOMEN - 1 VIEW COMPARISON:  08/21/2017, CT 08/21/2017 FINDINGS: Esophageal tube is coiled in  the stomach. Persistent dilatation of central small bowel loops measuring up to 6.1 cm but with contrast present within the colon and distal small bowel. IMPRESSION: Persistent dilatation of central small bowel loops but with contrast present within the colon. Electronically Signed   By: Donavan Foil M.D.   On: 08/21/2017 22:36   Dg Abd Portable 1v-small Bowel Protocol-position Verification  Result Date: 08/21/2017 CLINICAL DATA:  67 year old male with a history of nasogastric tube placement. EXAM: PORTABLE ABDOMEN - 1 VIEW COMPARISON:  None. FINDINGS: Limited plain film of the upper abdomen demonstrates gastric tube terminating in the left upper quadrant within the body of the stomach. Lap band present, poorly visualized. IMPRESSION: Limited upper abdomen plain film demonstrates gastric tube terminating within the stomach. Electronically Signed   By: Corrie Mckusick D.O.   On: 08/21/2017 12:57    Anti-infectives: Anti-infectives (From admission, onward)   None     Assessment/Plan SBO - Hx lap band 7 years ago - SB protocol 4/2 >> contrast in the colon; still with some dilated SB on AXR - bowel function returning with flatus and multiple BMs - D/C NG tube and start clear liquid diet  HTN - hydralazine prn, BP good right now RA - hold home meds OSA - no CPAP at home.  Monitor for now Morbid obesity - h/o lap band surgery.    FEN - clears VTE - Lovenox, SCDs  ID - none Foley - none    LOS: 1 day    [facility] 08/22/2017, 9:06 AM Pager: [phone] Consults: [phone] Mon-Fri 7:00 am-4:30 pm Sat-Sun 7:00 am-11:30 am

## 2017-08-23 NOTE — Progress Notes (Signed)
Assessment unchanged. Pt verbalized understanding of discharge instructions through teach back including follow up care and when to call the doctor. No scripts. Discharged via foot per request to meet wife at front entrance. Accompanied by NT.

## 2017-08-23 NOTE — Discharge Instructions (Signed)
Low-Fiber Diet °Fiber is found in fruits, vegetables, and whole grains. A low-fiber diet restricts fibrous foods that are not digested in the small intestine. A diet containing about 10-15 grams of fiber per day is considered low fiber. Low-fiber diets may be used to: °· Promote healing and rest the bowel during intestinal flare-ups. °· Prevent blockage of a partially obstructed or narrowed gastrointestinal tract. °· Reduce fecal weight and volume. °· Slow the movement of feces. ° °You may be on a low-fiber diet as a transitional diet following surgery, after an injury (trauma), or because of a short (acute) or lifelong (chronic) illness. Your health care provider will determine the length of time you need to stay on this diet. °What do I need to know about a low-fiber diet? °Always check the fiber content on the packaging's Nutrition Facts label, especially on foods from the grains list. Ask your dietitian if you have questions about specific foods that are related to your condition, especially if the food is not listed below. In general, a low-fiber food will have less than 2 g of fiber. °What foods can I eat? °Grains °All breads and crackers made with white flour. Sweet rolls, doughnuts, waffles, pancakes, French toast, bagels. Pretzels, Melba toast, zwieback. Well-cooked cereals, such as cornmeal, farina, or cream cereals. Dry cereals that do not contain whole grains, fruit, or nuts, such as refined corn, wheat, rice, and oat cereals. Potatoes prepared any way without skins, plain pastas and noodles, refined white rice. Use white flour for baking and making sauces. Use allowed list of grains for casseroles, dumplings, and puddings. °Vegetables °Strained tomato and vegetable juices. Fresh lettuce, cucumber, spinach. Well-cooked (no skin or pulp) or canned vegetables, such as asparagus, bean sprouts, beets, carrots, green beans, mushrooms, potatoes, pumpkin, spinach, yellow squash, tomato sauce/puree, turnips,  yams, and zucchini. Keep servings limited to ½ cup. °Fruits °All fruit juices except prune juice. Cooked or canned fruits without skin and seeds, such as applesauce, apricots, cherries, fruit cocktail, grapefruit, grapes, mandarin oranges, melons, peaches, pears, pineapple, and plums. Fresh fruits without skin, such as apricots, avocados, bananas, melons, pineapple, nectarines, and peaches. Keep servings limited to ½ cup or 1 piece. °Meat and Other Protein Sources °Ground or well-cooked tender beef, ham, veal, lamb, pork, or poultry. Eggs, plain cheese. Fish, oysters, shrimp, lobster, and other seafood. Liver, organ meats. Smooth nut butters. °Dairy °All milk products and alternative dairy substitutes, such as soy, rice, almond, and coconut, not containing added whole nuts, seeds, or added fruit. °Beverages °Decaf coffee, fruit, and vegetable juices or smoothies (small amounts, with no pulp or skins, and with fruits from allowed list), sports drinks, herbal tea. °Condiments °Ketchup, mustard, vinegar, cream sauce, cheese sauce, cocoa powder. Spices in moderation, such as allspice, basil, bay leaves, celery powder or leaves, cinnamon, cumin powder, curry powder, ginger, mace, marjoram, onion or garlic powder, oregano, paprika, parsley flakes, ground pepper, rosemary, sage, savory, tarragon, thyme, and turmeric. °Sweets and Desserts °Plain cakes and cookies, pie made with allowed fruit, pudding, custard, cream pie. Gelatin, fruit, ice, sherbet, frozen ice pops. Ice cream, ice milk without nuts. Plain hard candy, honey, jelly, molasses, syrup, sugar, chocolate syrup, gumdrops, marshmallows. Limit overall sugar intake. °Fats and Oil °Margarine, butter, cream, mayonnaise, salad oils, plain salad dressings made from allowed foods. Choose healthy fats such as olive oil, canola oil, and omega-3 fatty acids (such as found in salmon or tuna) when possible. °Other °Bouillon, broth, or cream soups made from allowed foods. Any    strained soup. Casseroles or mixed dishes made with allowed foods. °The items listed above may not be a complete list of recommended foods or beverages. Contact your dietitian for more options. °What foods are not recommended? °Grains °All whole wheat and whole grain breads and crackers. Multigrains, rye, bran seeds, nuts, or coconut. Cereals containing whole grains, multigrains, bran, coconut, nuts, raisins. Cooked or dry oatmeal, steel-cut oats. Coarse wheat cereals, granola. Cereals advertised as high fiber. Potato skins. Whole grain pasta, wild or brown rice. Popcorn. Coconut flour. Bran, buckwheat, corn bread, multigrains, rye, wheat germ. °Vegetables °Fresh, cooked or canned vegetables, such as artichokes, asparagus, beet greens, broccoli, Brussels sprouts, cabbage, celery, cauliflower, corn, eggplant, kale, legumes or beans, okra, peas, and tomatoes. Avoid large servings of any vegetables, especially raw vegetables. °Fruits °Fresh fruits, such as apples with or without skin, berries, cherries, figs, grapes, grapefruit, guavas, kiwis, mangoes, oranges, papayas, pears, persimmons, pineapple, and pomegranate. Prune juice and juices with pulp, stewed or dried prunes. Dried fruits, dates, raisins. Fruit seeds or skins. Avoid large servings of all fresh fruits. °Meats and Other Protein Sources °Tough, fibrous meats with gristle. Chunky nut butter. Cheese made with seeds, nuts, or other foods not recommended. Nuts, seeds, legumes (beans, including baked beans), dried peas, beans, lentils. °Dairy °Yogurt or cheese that contains nuts, seeds, or added fruit. °Beverages °Fruit juices with high pulp, prune juice. Caffeinated coffee and teas. °Condiments °Coconut, maple syrup, pickles, olives. °Sweets and Desserts °Desserts, cookies, or candies that contain nuts or coconut, chunky peanut butter, dried fruits. Jams, preserves with seeds, marmalade. Large amounts of sugar and sweets. Any other dessert made with fruits from  the not recommended list. °Other °Soups made from vegetables that are not recommended or that contain other foods not recommended. °The items listed above may not be a complete list of foods and beverages to avoid. Contact your dietitian for more information. °This information is not intended to replace advice given to you by your health care provider. Make sure you discuss any questions you have with your health care provider. °Document Released: 10/28/2001 Document Revised: 10/14/2015 Document Reviewed: 03/31/2013 °Elsevier Interactive Patient Education © 2017 [employer] ° °

## 2017-08-23 NOTE — Discharge Summary (Signed)
Patient ID: David [name] [id number] [birth year] 67 y.o.  Admit date: 08/21/2017 Discharge date: 08/23/2017  Admitting Diagnosis: SBO  Discharge Diagnosis Patient Active Problem List   Diagnosis Date Noted  . SBO (small bowel obstruction) ([place]) 08/21/2017  . Trochanteric bursitis of left hip 01/18/2017  . Lateral epicondylitis, right elbow 01/18/2017  . Rheumatoid arthritis ([place]) 03/15/2016  . DJD (degenerative joint disease), cervical 03/15/2016  . DDD (degenerative disc disease), lumbar 03/15/2016  . HTN (hypertension) 03/15/2016  . Obesity 03/15/2016  . Elevated cholesterol 03/15/2016  . Neuralgia 03/15/2016  . Malignant melanoma of right side of neck ([place]) 03/15/2016  . BBB (bundle branch block) 03/15/2016  . High risk medication use 03/15/2016  . Osteoarthritis of lumbar spine 03/15/2016  . Primary osteoarthritis of both hands 03/15/2016  . Primary osteoarthritis of both knees 03/15/2016  . Mesenteric adenitis 06/29/2015    Consultants none  Reason for Admission: This is a 67 yo male with a history of HTN, OSA, RA, and obesity s/p lap band placement 7 years ago by Dr. Lucia Gaskins.  The patient states that he last saw Dr. Lucia Gaskins about 7 years ago.  He is unsure if he has any fluid remaining in his band.  He states he is able to eat whatever he wants.  He lost about 70lbs and then regained at least 30lbs or more back.  He has never had any other abdominal surgery.    Yesterday morning, he ate cereal, but then developed nausea and vomiting.  After initially vomiting, he could not vomit anymore.  He just had dry heaves.  His abdominal pain persistently worsened throughout the day.  Nothing helped his symptoms.  He tried drinking ginger ale, but to no avail.  He states he passed a lot of flatus and had a liquid BM yesterday morning, but nothing since.  His wife took him to [place] last night where he had a CT scan that revealed a SBO.  He has since had an NGT placed and  transferred to [facility] for admission.  Procedures none  Hospital Course:  The patient was admitted after NGT placed.  He was started on the SBO protocol.  On his 8 hr delay films, he had contrast in his colon already.  He only had about 200cc of total NGT output during his admission.  His NGT was then removed on HD 1 and he was started on clear liquids.  The following day, he was still passing flatus and having BMs.  His diet was then advanced to a soft diet and he was stable for DC home.    Physical Exam: Heart: regular Lungs: CTAB Abd: soft, NT, obese, +BS  Allergies as of 08/23/2017      Reactions   Codeine Itching   Dilaudid [hydromorphone Hcl] Itching   Fentanyl Itching   Pt states Fentanyl patch causes itching   Nucynta [tapentadol]    MENTAL PROBLEMS   Penicillins Itching   Has patient had a PCN reaction causing immediate rash, facial/tongue/throat swelling, SOB or lightheadedness with hypotension:unsure childhood reaction Has patient had a PCN reaction causing severe rash involving mucus membranes or skin necrosis:unsure Has patient had a PCN reaction that required hospitalization:No Has patient had a PCN reaction occurring within the last 10 years:NO If all of the above answers are "NO", then may proceed with Cephalosporin use.      Medication List    TAKE these medications   diclofenac sodium 1 % Gel Commonly known as:  VOLTAREN Voltaren Gel 3 grams to 3 large joints upto TID 3 TUBES with 3 refills   DULoxetine 60 MG capsule Commonly known as:  CYMBALTA Take 60 mg by mouth 2 (two) times daily.   folic acid 1 MG tablet Commonly known as:  FOLVITE Take 2 tablets (2 mg total) by mouth daily.   gabapentin 300 MG capsule Commonly known as:  NEURONTIN Take 900 mg by mouth 2 (two) times daily.   hydrochlorothiazide 25 MG tablet Commonly known as:  HYDRODIURIL Take 25 mg by mouth daily.   hydroxychloroquine 200 MG tablet Commonly known as:  PLAQUENIL Take 1 tablet  (200 mg total) by mouth 2 (two) times daily.   ibuprofen 200 MG tablet Commonly known as:  ADVIL,MOTRIN Take 400 mg by mouth every 6 (six) hours as needed (for pain.).   methotrexate 50 MG/2ML injection Inject 1 mL (25 mg total) into the skin once a week.   metoprolol succinate 25 MG 24 hr tablet Commonly known as:  TOPROL-XL Take 25 mg by mouth daily.   rOPINIRole 4 MG tablet Commonly known as:  REQUIP Take 4 mg by mouth every evening.   simvastatin 40 MG tablet Commonly known as:  ZOCOR Take 40 mg by mouth daily.   sulfaSALAzine 500 MG EC tablet Commonly known as:  AZULFIDINE TAKE 2 TABLETS BY MOUTH TWICE DAILY   tamsulosin 0.4 MG Caps capsule Commonly known as:  FLOMAX Take 0.4 mg by mouth daily.   Tuberculin-Allergy Syringes 27G X 1/2" 1 ML Misc Patient to inject MTX once weekly   zolpidem 10 MG tablet Commonly known as:  AMBIEN Take 10 mg by mouth at bedtime as needed for sleep.        Follow-up Information    Orpah Melter, MD Follow up.   Specialty:  Family Medicine Why:  as needed Contact information: [address] [place] [address] [phone]           Signed: Saverio Danker, [facility] 08/23/2017, 8:56 AM Pager: [phone]

## 2017-08-29 NOTE — Progress Notes (Signed)
Office Visit Note  Patient: David [name]             Date of Birth: 1950-11-02           MRN: [id number]             PCP: Orpah Melter, MD Referring: Orpah Melter, MD Visit Date: 08/30/2017 Occupation: @GUAROCC @    Subjective:  Medication Management   History of Present Illness: David [name] is a 67 y.o. male with history of seronegative rheumatoid arthritis, osteoarthritis and degenerative disc disease.  Patient reports that he was recently hospitalized for partial small bowel obstruction.  He was discharged after 3 days.  He is on low fiber diet now.  He was also having the right lower extremity tingling and hot sensation for which she was evaluated by neurosurgeon.  Prior to that he was seen by PCP and was started on prednisone Dosepak.  He states the prednisone he stopped his symptoms.  The MRI of the lumbar spine showed mild to moderate spinal stenosis.  He is taking Mobic currently.  He is not having much joint discomfort currently.  He denies any joint swelling.  Activities of Daily Living:  Patient reports morning stiffness for 1 hour.   Patient Denies nocturnal pain.  Difficulty dressing/grooming: Denies Difficulty climbing stairs: Denies Difficulty getting out of chair: Denies Difficulty using hands for taps, buttons, cutlery, and/or writing: Denies   Review of Systems  Constitutional: Negative for fatigue and night sweats.  HENT: Positive for mouth dryness. Negative for mouth sores and nose dryness.   Eyes: Negative for redness and dryness.  Respiratory: Negative for shortness of breath and difficulty breathing.   Cardiovascular: Positive for hypertension. Negative for chest pain, palpitations, irregular heartbeat and swelling in legs/feet.  Gastrointestinal: Negative for constipation and diarrhea.  Endocrine: Negative for increased urination.  Musculoskeletal: Positive for arthralgias, joint pain and morning stiffness. Negative for joint swelling, myalgias,  muscle weakness, muscle tenderness and myalgias.  Skin: Negative for color change, rash, hair loss, nodules/bumps, skin tightness, ulcers and sensitivity to sunlight.  Allergic/Immunologic: Negative for susceptible to infections.  Neurological: Negative for dizziness, fainting, memory loss, night sweats and weakness ( ).  Hematological: Negative for swollen glands.  Psychiatric/Behavioral: Negative for depressed mood and sleep disturbance. The patient is not nervous/anxious.     PMFS History:  Patient Active Problem List   Diagnosis Date Noted  . SBO (small bowel obstruction) ([place]) 08/21/2017  . Trochanteric bursitis of left hip 01/18/2017  . Lateral epicondylitis, right elbow 01/18/2017  . Rheumatoid arthritis ([place]) 03/15/2016  . DJD (degenerative joint disease), cervical 03/15/2016  . DDD (degenerative disc disease), lumbar 03/15/2016  . HTN (hypertension) 03/15/2016  . Obesity 03/15/2016  . Elevated cholesterol 03/15/2016  . Neuralgia 03/15/2016  . Malignant melanoma of right side of neck ([place]) 03/15/2016  . BBB (bundle branch block) 03/15/2016  . High risk medication use 03/15/2016  . Osteoarthritis of lumbar spine 03/15/2016  . Primary osteoarthritis of both hands 03/15/2016  . Primary osteoarthritis of both knees 03/15/2016  . Mesenteric adenitis 06/29/2015    Past Medical History:  Diagnosis Date  . Arthritis   . BBB (bundle branch block) 03/15/2016  . Carpal tunnel syndrome   . DDD (degenerative disc disease), lumbar 03/15/2016  . Depression   . Dysrhythmia    had some tachycardia with steroids  . Elevated cholesterol 03/15/2016  . GERD (gastroesophageal reflux disease)    no meds since gastric banding  . Headache(784.0)  Ocular Migraines - takes Ambien for it  . HTN (hypertension) 03/15/2016  . Hyperlipemia   . Hypertension   . Malignant melanoma of right side of neck ([place]) 03/15/2016   30 years ago   . Neuralgia 03/15/2016  . Rheumatoid arthritis ([place])  03/15/2016   Sero Negative.   Marland Kitchen RLS (restless legs syndrome)   . Shortness of breath    DOE  . Sleep apnea    could not use a cpap  . Snores   . Status post gastric banding   . Wears glasses    driving    Family History  Problem Relation Age of Onset  . Heart disease Mother   . Alzheimer's disease Father    Past Surgical History:  Procedure Laterality Date  . CARPAL TUNNEL RELEASE Right 09/26/2012   Procedure: CARPAL TUNNEL RELEASE;  Surgeon: Tennis Must, MD;  Location: [facility];  Service: Orthopedics;  Laterality: Right;  . CARPAL TUNNEL RELEASE Left 10/24/2012   Procedure: CARPAL TUNNEL RELEASE;  Surgeon: Tennis Must, MD;  Location: [facility];  Service: Orthopedics;  Laterality: Left;  . EXCISION MELANOMA WITH SENTINEL LYMPH NODE BIOPSY  1984   rt neck with mulpipal nodes and some muscle excised rt neck-shoulder  . KNEE ARTHROPLASTY    . knee replacement    . LAPAROSCOPIC GASTRIC BANDING  03/15/2009  . LAPAROSCOPIC GASTRIC BANDING    . MUSCLE BIOPSY  2004   rt thigh to r/o myocitis  . TONSILLECTOMY    . TOTAL KNEE ARTHROPLASTY Bilateral 12/05/2010  . TRIGGER FINGER RELEASE Right 09/26/2012   Procedure: RELEASE TRIGGER FINGER/A-1 PULLEY RING AND SMALL ;  Surgeon: Tennis Must, MD;  Location: [facility];  Service: Orthopedics;  Laterality: Right;  . TRIGGER FINGER RELEASE Left 10/24/2012   Procedure: RELEASE TRIGGER FINGER/A-1 PULLEY LEFT MIDDLE AND LEFT RING;  Surgeon: Tennis Must, MD;  Location: [facility];  Service: Orthopedics;  Laterality: Left;   Social History   Social History Narrative  . Not on file     Objective: Vital Signs: BP 109/69 (BP Location: Left Arm, Patient Position: Sitting, Cuff Size: Normal)   Pulse 76   Resp 16   Ht 5\' 8"  (1.727 m)   Wt 257 lb (116.6 kg)   BMI 39.08 kg/m    Physical Exam  Constitutional: He is oriented to person, place, and time. He appears well-developed and  well-nourished.  HENT:  Head: Normocephalic and atraumatic.  Eyes: Pupils are equal, round, and reactive to light. Conjunctivae and EOM are normal.  Neck: Normal range of motion. Neck supple.  Cardiovascular: Normal rate, regular rhythm and normal heart sounds.  Pulmonary/Chest: Effort normal and breath sounds normal.  Abdominal: Soft. Bowel sounds are normal.  Neurological: He is alert and oriented to person, place, and time.  Skin: Skin is warm and dry. Capillary refill takes less than 2 seconds.  Psychiatric: He has a normal mood and affect. His behavior is normal.  Nursing note and vitals reviewed.    Musculoskeletal Exam: C-spine thoracic lumbar spine good range of motion.  Shoulder joints elbow joints wrist joints are good range of motion.  He has no synovitis over MCPs or MTP joints.  He is thickening of PIP/DIP joints in his hands and feet.  Hip joints were in good range of motion.  He has bilateral total knee replacement which is doing well.  CDAI Exam: CDAI Homunculus Exam:   Joint Counts:  CDAI Tender Joint count: 0 CDAI Swollen Joint count: 0  Global Assessments:  Patient Global Assessment: 4 Provider Global Assessment: 2  CDAI Calculated Score: 6    Investigation: No additional findings. CBC Latest Ref Rng & Units 08/22/2017 08/21/2017 06/15/2017  WBC 4.0 - 10.5 K/uL 8.8 12.9(H) 7.3  Hemoglobin 13.0 - 17.0 g/dL 14.7 16.1 14.8  Hematocrit 39.0 - 52.0 % 43.9 47.5 43.3  Platelets 150 - 400 K/uL 234 317 285   CMP Latest Ref Rng & Units 08/22/2017 08/21/2017 06/15/2017  Glucose 65 - 99 mg/dL 110(H) 133(H) 93  BUN 6 - 20 mg/dL 12 15 17   Creatinine 0.61 - 1.24 mg/dL 1.06 1.03 1.15  Sodium 135 - 145 mmol/L 141 136 138  Potassium 3.5 - 5.1 mmol/L 3.5 3.5 4.4  Chloride 101 - 111 mmol/L 107 100(L) 99  CO2 22 - 32 mmol/L 27 25 32  Calcium 8.9 - 10.3 mg/dL 8.9 9.2 9.9  Total Protein 6.5 - 8.1 g/dL - 7.3 6.9  Total Bilirubin 0.3 - 1.2 mg/dL - 0.7 0.5  Alkaline Phos 38 - 126 U/L  - 69 -  AST 15 - 41 U/L - 36 29  ALT 17 - 63 U/L - 29 25    Imaging: Dg Abdomen 1 View  Result Date: 08/21/2017 CLINICAL DATA:  67 year old male status post NG tube placement. EXAM: ABDOMEN - 1 VIEW COMPARISON:  Abdominal CT dated 08/21/2017 FINDINGS: An enteric tube is partially visualized with tip in the left upper abdomen, in the proximal stomach. The side port of the tube is in the distal esophagus proximal to the lap band. Recommend further advancing of the tube into the stomach. A lap band tubing is noted. Dilated loops of small bowel measure up to 5 cm. IMPRESSION: Enteric tube tip in the proximal stomach. Recommend advancing the tube further into the stomach. Persistent dilatation of small-bowel loops. Electronically Signed   By: Anner Crete M.D.   On: 08/21/2017 05:58   Ct Abdomen Pelvis W Contrast  Result Date: 08/21/2017 CLINICAL DATA:  Periumbilical pain for 8 hours EXAM: CT ABDOMEN AND PELVIS WITH CONTRAST TECHNIQUE: Multidetector CT imaging of the abdomen and pelvis was performed using the standard protocol following bolus administration of intravenous contrast. CONTRAST:  163mL ISOVUE-300 IOPAMIDOL (ISOVUE-300) INJECTION 61% COMPARISON:  CT 06/29/2015 FINDINGS: Lower chest: Lung bases demonstrate no acute consolidation or pleural effusion. Normal heart size. Hepatobiliary: No focal hepatic abnormality. Multiple calcified gallstones. No gallbladder wall thickening. No biliary dilatation. Pancreas: Unremarkable. No pancreatic ductal dilatation or surrounding inflammatory changes. Spleen: Normal in size without focal abnormality. Adrenals/Urinary Tract: Adrenal glands are unremarkable. Kidneys are normal, without renal calculi, focal lesion, or hydronephrosis. Bladder is unremarkable. Stomach/Bowel: Status post gastric banding. Moderate dilated stomach. Multiple loops of dilated proximal to mid small bowel. Transition to decompressed distal small bowel in the right anterior lower abdomen,  slightly to the right of midline, near the level of the umbilicus. Colon is decompressed. Appendix is normal. No colon wall thickening. Vascular/Lymphatic: Mild aortic atherosclerosis. No aneurysmal dilatation. No significantly enlarged lymph nodes Reproductive: Prostate is unremarkable. Other: Negative for free air or free fluid. Small fat in the umbilical region. Musculoskeletal: Degenerative changes. No acute or suspicious bone lesion IMPRESSION: 1. Status post gastric banding. Moderate dilatation of the stomach with multiple fluid-filled dilated loops of proximal to mid small bowel with transition to decompressed distal small bowel in the right anterior lower abdomen; findings are consistent with mechanical small bowel obstruction, likely due to  adhesions. 2. Negative appendix 3. Multiple gallstones Electronically Signed   By: Donavan Foil M.D.   On: 08/21/2017 02:13   Dg Chest Portable 1 View  Result Date: 08/21/2017 CLINICAL DATA:  Nasogastric tube placement. EXAM: PORTABLE CHEST 1 VIEW COMPARISON:  Chest radiograph September 15, 2013 FINDINGS: Cardiac silhouette is at least mildly enlarged. Tortuous aorta assisted with chronic hypertension. Bibasilar strandy densities without pleural effusion or focal consolidation. Nasogastric tube past GE junction, distal tip out of field-of-view. Soft tissue planes and included osseous structures are nonsuspicious. IMPRESSION: Nasogastric tube past GE junction, distal tip out of field-of-view. Stable cardiomegaly.  Bibasilar atelectasis/scarring. Electronically Signed   By: Elon Alas M.D.   On: 08/21/2017 04:25   Dg Abd Portable 1v-small Bowel Obstruction Protocol-initial, 8 Hr Delay  Result Date: 08/21/2017 CLINICAL DATA:  Small bowel obstruction protocol, 8 hour delay EXAM: PORTABLE ABDOMEN - 1 VIEW COMPARISON:  08/21/2017, CT 08/21/2017 FINDINGS: Esophageal tube is coiled in the stomach. Persistent dilatation of central small bowel loops measuring up to 6.1  cm but with contrast present within the colon and distal small bowel. IMPRESSION: Persistent dilatation of central small bowel loops but with contrast present within the colon. Electronically Signed   By: Donavan Foil M.D.   On: 08/21/2017 22:36   Dg Abd Portable 1v-small Bowel Protocol-position Verification  Result Date: 08/21/2017 CLINICAL DATA:  67 year old male with a history of nasogastric tube placement. EXAM: PORTABLE ABDOMEN - 1 VIEW COMPARISON:  None. FINDINGS: Limited plain film of the upper abdomen demonstrates gastric tube terminating in the left upper quadrant within the body of the stomach. Lap band present, poorly visualized. IMPRESSION: Limited upper abdomen plain film demonstrates gastric tube terminating within the stomach. Electronically Signed   By: Corrie Mckusick D.O.   On: 08/21/2017 12:57    Speciality Comments: No specialty comments available.    Procedures:  No procedures performed Allergies: Codeine; Dilaudid [hydromorphone hcl]; Fentanyl; Nucynta [tapentadol]; and Penicillins   Assessment / Plan:     Visit Diagnoses: Rheumatoid arthritis of multiple sites with negative rheumatoid factor ([place]) he had no synovitis on examination.  We had detailed discussion regarding tapering his medications.  He wants to stay on the same medication regimen.  He states it took a long time to get at this stage and he would rather wait until he comes from his trip in September.  High risk medication use - MTX 1 mL subcu weekly, PLQ 200 mg p.o. twice daily, SSZ 500 mg 2 tablets p.o. twice daily, folic acid 2 mg p.o. Daily, he states that his eye exam was within 6 months.  Primary osteoarthritis of both hands: Joint protection muscle strengthening was discussed.  Hx of total knee replacement, bilateral: Doing well.  DDD (degenerative disc disease), cervical: He has some stiffness with range of motion of his C-spine.  DDD (degenerative disc disease), lumbar - with spinal stenosis.  He has  been having right-sided radiculopathy with paresthesias.  He states he had evaluation by neurosurgeon and no surgical intervention is needed at this time.  He had some relief with the prednisone.  He is taking Mobic right now without any benefit.  Have advised him to discontinue Mobic as he is on multiple medications and it may elevate his liver functions.  Class II obesity: Weight loss diet and exercise was discussed.  He had been procedure few years back.  Dietary modification was discussed at length.  It will help his arthritis and also his lower back pain.  Neuralgia: Related to his disc disease.  History of melanoma    Association of heart disease with rheumatoid arthritis was discussed. Need to monitor blood pressure, cholesterol, and to exercise 30-60 minutes on daily basis was discussed. Poor dental hygiene can be a predisposing factor for rheumatoid arthritis. Good dental hygiene was discussed. Orders: No orders of the defined types were placed in this encounter.  No orders of the defined types were placed in this encounter.   Face-to-face time spent with patient was 30 minutes. Greater than 50% of time was spent in counseling and coordination of care.  Follow-Up Instructions: Return in about 5 months (around 01/30/2018) for Rheumatoid arthritis, Osteoarthritis,DDD.   Bo Merino, MD  Note - This record has been created using Editor, commissioning.  Chart creation errors have been sought, but may not always  have been located. Such creation errors do not reflect on  the standard of medical care.

## 2017-08-30 ENCOUNTER — Encounter: Payer: Self-pay | Admitting: Rheumatology

## 2017-08-30 ENCOUNTER — Ambulatory Visit (INDEPENDENT_AMBULATORY_CARE_PROVIDER_SITE_OTHER): Payer: Medicare Other | Admitting: Rheumatology

## 2017-08-30 VITALS — BP 109/69 | HR 76 | Resp 16 | Ht 68.0 in | Wt 257.0 lb

## 2017-08-30 DIAGNOSIS — M19042 Primary osteoarthritis, left hand: Secondary | ICD-10-CM

## 2017-08-30 DIAGNOSIS — Z79899 Other long term (current) drug therapy: Secondary | ICD-10-CM | POA: Diagnosis not present

## 2017-08-30 DIAGNOSIS — M19041 Primary osteoarthritis, right hand: Secondary | ICD-10-CM

## 2017-08-30 DIAGNOSIS — M5030 Other cervical disc degeneration, unspecified cervical region: Secondary | ICD-10-CM | POA: Diagnosis not present

## 2017-08-30 DIAGNOSIS — Z6839 Body mass index (BMI) 39.0-39.9, adult: Secondary | ICD-10-CM | POA: Diagnosis not present

## 2017-08-30 DIAGNOSIS — M792 Neuralgia and neuritis, unspecified: Secondary | ICD-10-CM | POA: Diagnosis not present

## 2017-08-30 DIAGNOSIS — M0609 Rheumatoid arthritis without rheumatoid factor, multiple sites: Secondary | ICD-10-CM | POA: Diagnosis not present

## 2017-08-30 DIAGNOSIS — Z85820 Personal history of malignant melanoma of skin: Secondary | ICD-10-CM | POA: Diagnosis not present

## 2017-08-30 DIAGNOSIS — E6609 Other obesity due to excess calories: Secondary | ICD-10-CM

## 2017-08-30 DIAGNOSIS — M5136 Other intervertebral disc degeneration, lumbar region: Secondary | ICD-10-CM | POA: Diagnosis not present

## 2017-08-30 DIAGNOSIS — Z96653 Presence of artificial knee joint, bilateral: Secondary | ICD-10-CM

## 2017-08-30 NOTE — Patient Instructions (Signed)
Association of heart disease with rheumatoid arthritis was discussed. Need to monitor blood pressure, cholesterol, and to exercise 30-60 minutes on daily basis was discussed. Poor dental hygiene can be a predisposing factor for rheumatoid arthritis. Good dental hygiene was discussed.  Standing Labs We placed an order today for your standing lab work.    Please come back and get your standing labs in July and every 3 months  We have open lab Monday through Friday from 8:30-11:30 AM and 1:30-4:00 PM  at the office of Dr. Aleaya Latona.   You may experience shorter wait times on Monday and Friday afternoons. The office is located at [address], [address], [place], [place] [address] No appointment is necessary.   Labs are drawn by [facility].  You may receive a bill from [facility] for your lab work. If you have any questions regarding directions or hours of operation,  please call [phone].    

## 2017-09-10 ENCOUNTER — Other Ambulatory Visit: Payer: Self-pay | Admitting: Rheumatology

## 2017-09-10 DIAGNOSIS — N400 Enlarged prostate without lower urinary tract symptoms: Secondary | ICD-10-CM | POA: Diagnosis not present

## 2017-09-10 DIAGNOSIS — M79604 Pain in right leg: Secondary | ICD-10-CM | POA: Diagnosis not present

## 2017-09-10 DIAGNOSIS — F325 Major depressive disorder, single episode, in full remission: Secondary | ICD-10-CM | POA: Diagnosis not present

## 2017-09-10 DIAGNOSIS — I10 Essential (primary) hypertension: Secondary | ICD-10-CM | POA: Diagnosis not present

## 2017-09-10 DIAGNOSIS — E782 Mixed hyperlipidemia: Secondary | ICD-10-CM | POA: Diagnosis not present

## 2017-09-10 DIAGNOSIS — I700 Atherosclerosis of aorta: Secondary | ICD-10-CM | POA: Diagnosis not present

## 2017-09-10 DIAGNOSIS — K56609 Unspecified intestinal obstruction, unspecified as to partial versus complete obstruction: Secondary | ICD-10-CM | POA: Diagnosis not present

## 2017-09-10 DIAGNOSIS — Z0000 Encounter for general adult medical examination without abnormal findings: Secondary | ICD-10-CM | POA: Diagnosis not present

## 2017-09-10 NOTE — Telephone Encounter (Signed)
Last Visit: 08/30/17 Next Visit: 02/12/18 Labs: 08/21/17 elevated glucose and elevated WBCs.  Okay to refill per Dr. Estanislado Pandy

## 2017-10-12 ENCOUNTER — Telehealth: Payer: Self-pay | Admitting: Rheumatology

## 2017-10-12 NOTE — Telephone Encounter (Signed)
Patient called requesting prescription refill of Hydroxychloroquine to be sent to [facility] in [place].

## 2017-10-12 NOTE — Telephone Encounter (Signed)
Last Visit: 08/30/17 Next Visit: 02/12/18 Labs: 08/21/17 elevated glucose and elevated WBCs.

## 2017-10-23 ENCOUNTER — Telehealth: Payer: Self-pay | Admitting: Rheumatology

## 2017-10-23 NOTE — Telephone Encounter (Signed)
See previous phone note.  

## 2017-10-23 NOTE — Telephone Encounter (Signed)
Patient left a voicemail requesting prescription refill of Plaquenil.  [facility]'s [facility] is [name] [address]

## 2017-10-23 NOTE — Telephone Encounter (Signed)
Last Visit: 08/30/17 Next Visit: 02/12/18 Labs: 08/21/17 elevated glucose and elevated WBCs. Patient's wife is having PLQ eye exam sent to office.

## 2017-10-24 MED ORDER — HYDROXYCHLOROQUINE SULFATE 200 MG PO TABS: 200.0000 mg | ORAL_TABLET | Freq: Two times a day (BID) | ORAL | 0 refills | Status: DC

## 2017-10-24 NOTE — Addendum Note (Signed)
Addended by: Carole Binning on: 10/24/2017 11:26 AM   Modules accepted: Orders

## 2017-10-24 NOTE — Telephone Encounter (Signed)
Okay to refill 30 day supply per Dr. Deveshwar 

## 2017-11-14 ENCOUNTER — Telehealth (INDEPENDENT_AMBULATORY_CARE_PROVIDER_SITE_OTHER): Payer: Self-pay | Admitting: Rheumatology

## 2017-11-14 NOTE — Telephone Encounter (Signed)
Emailed patient advising that copy of records are ready at front desk to pick up

## 2017-11-20 ENCOUNTER — Other Ambulatory Visit: Payer: Self-pay | Admitting: Rheumatology

## 2017-11-20 DIAGNOSIS — M48062 Spinal stenosis, lumbar region with neurogenic claudication: Secondary | ICD-10-CM | POA: Diagnosis not present

## 2017-11-20 DIAGNOSIS — Z6841 Body Mass Index (BMI) 40.0 and over, adult: Secondary | ICD-10-CM | POA: Diagnosis not present

## 2017-11-20 DIAGNOSIS — I10 Essential (primary) hypertension: Secondary | ICD-10-CM | POA: Diagnosis not present

## 2017-11-20 NOTE — Telephone Encounter (Signed)
Last Visit: 08/30/17 Next Visit: 02/12/18 Labs: 08/21/17 elevated glucose and elevated WBCs. PLQ Eye Exam: 03/27/17 WNL  Okay to refill per Dr. Estanislado Pandy

## 2017-12-10 ENCOUNTER — Telehealth: Payer: Self-pay | Admitting: Rheumatology

## 2017-12-10 MED ORDER — METHOTREXATE SODIUM CHEMO INJECTION 50 MG/2ML: 25.0000 mg | INTRAMUSCULAR | 0 refills | Status: DC

## 2017-12-10 NOTE — Telephone Encounter (Signed)
Patient called requesting prescription refill of Methotrexate to be sent to [facility] on [address]. Patient states he has been out of medication for 3 weeks now.

## 2017-12-10 NOTE — Telephone Encounter (Signed)
Last Visit: 08/30/17 Next Visit: 02/12/18 Labs: 08/21/17 elevated glucose and elevated WBCs.  Left message to advise patient he is due for labs.   Okay to refill 30 day supply per Dr. Estanislado Pandy

## 2017-12-13 ENCOUNTER — Other Ambulatory Visit: Payer: Self-pay | Admitting: *Deleted

## 2017-12-13 ENCOUNTER — Other Ambulatory Visit: Payer: Self-pay

## 2017-12-13 ENCOUNTER — Other Ambulatory Visit: Payer: Self-pay | Admitting: Rheumatology

## 2017-12-13 DIAGNOSIS — Z79899 Other long term (current) drug therapy: Secondary | ICD-10-CM | POA: Diagnosis not present

## 2017-12-13 LAB — COMPLETE METABOLIC PANEL WITH GFR
AG Ratio: 1.8 (calc) (ref 1.0–2.5)
ALT: 22 U/L (ref 9–46)
AST: 29 U/L (ref 10–35)
Albumin: 4.2 g/dL (ref 3.6–5.1)
Alkaline phosphatase (APISO): 66 U/L (ref 40–115)
BUN: 15 mg/dL (ref 7–25)
CO2: 29 mmol/L (ref 20–32)
Calcium: 9.2 mg/dL (ref 8.6–10.3)
Chloride: 100 mmol/L (ref 98–110)
Creat: 1.05 mg/dL (ref 0.70–1.25)
GFR, Est African American: 85 mL/min/{1.73_m2} (ref >=60)
GFR, Est Non African American: 74 mL/min/{1.73_m2} (ref >=60)
Globulin: 2.3 g/dL (calc) (ref 1.9–3.7)
Glucose, Bld: 67 mg/dL (ref 65–99)
Potassium: 4.4 mmol/L (ref 3.5–5.3)
Sodium: 136 mmol/L (ref 135–146)
Total Bilirubin: 0.6 mg/dL (ref 0.2–1.2)
Total Protein: 6.5 g/dL (ref 6.1–8.1)

## 2017-12-13 LAB — CBC WITH DIFFERENTIAL/PLATELET
Basophils Absolute: 60 cells/uL (ref 0–200)
Basophils Relative: 0.8 %
Eosinophils Absolute: 173 cells/uL (ref 15–500)
Eosinophils Relative: 2.3 %
HCT: 43.1 % (ref 38.5–50.0)
Hemoglobin: 14.7 g/dL (ref 13.2–17.1)
Lymphs Abs: 2288 cells/uL (ref 850–3900)
MCH: 31.3 pg (ref 27.0–33.0)
MCHC: 34.1 g/dL (ref 32.0–36.0)
MCV: 91.7 fL (ref 80.0–100.0)
MPV: 9.7 fL (ref 7.5–12.5)
Monocytes Relative: 13.8 %
Neutro Abs: 3945 cells/uL (ref 1500–7800)
Neutrophils Relative %: 52.6 %
Platelets: 254 10*3/uL (ref 140–400)
RBC: 4.7 10*6/uL (ref 4.20–5.80)
RDW: 13.8 % (ref 11.0–15.0)
Total Lymphocyte: 30.5 %
WBC mixed population: 1035 cells/uL — ABNORMAL HIGH (ref 200–950)
WBC: 7.5 10*3/uL (ref 3.8–10.8)

## 2017-12-13 NOTE — Telephone Encounter (Signed)
Last Visit: 08/30/17 Next Visit: 02/12/18 Labs: 08/21/17 elevated glucose and elevated WBCs.  Patient aware he is due for labs  Okay to refill 30 day supply per Dr. Estanislado Pandy

## 2018-01-09 ENCOUNTER — Other Ambulatory Visit: Payer: Self-pay | Admitting: Rheumatology

## 2018-01-09 NOTE — Telephone Encounter (Signed)
Last Visit: 08/30/17 Next Visit: 02/12/18 Labs: 12/13/17 WNL  Okay to refill per Dr. Estanislado Pandy

## 2018-01-12 ENCOUNTER — Other Ambulatory Visit: Payer: Self-pay | Admitting: Rheumatology

## 2018-01-14 NOTE — Telephone Encounter (Signed)
Last Visit: 08/30/17 Next Visit: 02/12/18 Labs: 12/13/17 WNL  Okay to refill per Dr. Estanislado Pandy

## 2018-01-25 DIAGNOSIS — J1000 Influenza due to other identified influenza virus with unspecified type of pneumonia: Secondary | ICD-10-CM | POA: Diagnosis not present

## 2018-01-27 ENCOUNTER — Other Ambulatory Visit: Payer: Self-pay | Admitting: Rheumatology

## 2018-01-28 NOTE — Telephone Encounter (Signed)
Last Visit: 08/30/17 Next Visit: 02/12/18 Labs: 12/13/17 WNL PLQ Eye Exam: 03/27/17 WNL  Okay to refill per Dr. Estanislado Pandy

## 2018-02-04 NOTE — Progress Notes (Signed)
Office Visit Note  Patient: David [name]             Date of Birth: Nov 16, 1950           MRN: [id number]             PCP: Orpah Melter, MD Referring: Orpah Melter, MD Visit Date: 02/05/2018 Occupation: @GUAROCC @  Subjective:  Pain in hands   History of Present Illness: David [name] is a 67 y.o. male seronegative rheumatoid arthritis and osteoarthritis.  He also have disc disease of lumbar spine with a spinal stenosis.  He has been having some radiculopathy to the right lower extremity.  He has seen a neurosurgeon and will be scheduled for nerve conduction velocities.  He continues to have some pain and stiffness in his hands.  He denies any joint swelling.  He sometimes have swelling in his right hand.  Bilateral total knee replacement is doing well.  Activities of Daily Living:  Patient reports morning stiffness for 4 hours.   Patient Reports nocturnal pain.  Difficulty dressing/grooming: Denies Difficulty climbing stairs: Denies Difficulty getting out of chair: Denies Difficulty using hands for taps, buttons, cutlery, and/or writing: Denies  Review of Systems  Constitutional: Negative for fatigue and night sweats.  HENT: Positive for mouth dryness. Negative for mouth sores, ear ringing and nose dryness.   Eyes: Positive for dryness. Negative for redness.  Respiratory: Negative for cough, shortness of breath and difficulty breathing.   Cardiovascular: Negative for chest pain, palpitations, hypertension, irregular heartbeat and swelling in legs/feet.  Gastrointestinal: Negative for blood in stool, constipation and diarrhea.  Endocrine: Negative for increased urination.  Genitourinary: Negative for difficulty urinating.  Musculoskeletal: Positive for arthralgias, joint pain, joint swelling, myalgias, muscle weakness, morning stiffness and myalgias. Negative for muscle tenderness.  Skin: Negative for color change, rash, hair loss, nodules/bumps, skin tightness, ulcers and  sensitivity to sunlight.  Allergic/Immunologic: Negative for susceptible to infections.  Neurological: Positive for weakness. Negative for dizziness, fainting, headaches, memory loss and night sweats.  Hematological: Negative for swollen glands.  Psychiatric/Behavioral: Positive for sleep disturbance. Negative for depressed mood. The patient is not nervous/anxious.     PMFS History:  Patient Active Problem List   Diagnosis Date Noted  . SBO (small bowel obstruction) ([place]) 08/21/2017  . Trochanteric bursitis of left hip 01/18/2017  . Lateral epicondylitis, right elbow 01/18/2017  . Rheumatoid arthritis ([place]) 03/15/2016  . DJD (degenerative joint disease), cervical 03/15/2016  . DDD (degenerative disc disease), lumbar 03/15/2016  . HTN (hypertension) 03/15/2016  . Obesity 03/15/2016  . Elevated cholesterol 03/15/2016  . Neuralgia 03/15/2016  . Malignant melanoma of right side of neck ([place]) 03/15/2016  . BBB (bundle branch block) 03/15/2016  . High risk medication use 03/15/2016  . Osteoarthritis of lumbar spine 03/15/2016  . Primary osteoarthritis of both hands 03/15/2016  . Primary osteoarthritis of both knees 03/15/2016  . Mesenteric adenitis 06/29/2015    Past Medical History:  Diagnosis Date  . Arthritis   . BBB (bundle branch block) 03/15/2016  . Carpal tunnel syndrome   . DDD (degenerative disc disease), lumbar 03/15/2016  . Depression   . Dysrhythmia    had some tachycardia with steroids  . Elevated cholesterol 03/15/2016  . GERD (gastroesophageal reflux disease)    no meds since gastric banding  . Headache(784.0)    Ocular Migraines - takes Ambien for it  . HTN (hypertension) 03/15/2016  . Hyperlipemia   . Hypertension   . Malignant melanoma of  right side of neck ([place]) 03/15/2016   30 years ago   . Neuralgia 03/15/2016  . Rheumatoid arthritis ([place]) 03/15/2016   Sero Negative.   Marland Kitchen RLS (restless legs syndrome)   . Shortness of breath    DOE  . Sleep apnea     could not use a cpap  . Snores   . Status post gastric banding   . Wears glasses    driving    Family History  Problem Relation Age of Onset  . Heart disease Mother   . Alzheimer's disease Father    Past Surgical History:  Procedure Laterality Date  . CARPAL TUNNEL RELEASE Right 09/26/2012   Procedure: CARPAL TUNNEL RELEASE;  Surgeon: Tennis Must, MD;  Location: [facility];  Service: Orthopedics;  Laterality: Right;  . CARPAL TUNNEL RELEASE Left 10/24/2012   Procedure: CARPAL TUNNEL RELEASE;  Surgeon: Tennis Must, MD;  Location: [facility];  Service: Orthopedics;  Laterality: Left;  . EXCISION MELANOMA WITH SENTINEL LYMPH NODE BIOPSY  1984   rt neck with mulpipal nodes and some muscle excised rt neck-shoulder  . KNEE ARTHROPLASTY    . knee replacement    . LAPAROSCOPIC GASTRIC BANDING  03/15/2009  . LAPAROSCOPIC GASTRIC BANDING    . MUSCLE BIOPSY  2004   rt thigh to r/o myocitis  . TONSILLECTOMY    . TOTAL KNEE ARTHROPLASTY Bilateral 12/05/2010  . TRIGGER FINGER RELEASE Right 09/26/2012   Procedure: RELEASE TRIGGER FINGER/A-1 PULLEY RING AND SMALL ;  Surgeon: Tennis Must, MD;  Location: [facility];  Service: Orthopedics;  Laterality: Right;  . TRIGGER FINGER RELEASE Left 10/24/2012   Procedure: RELEASE TRIGGER FINGER/A-1 PULLEY LEFT MIDDLE AND LEFT RING;  Surgeon: Tennis Must, MD;  Location: [facility];  Service: Orthopedics;  Laterality: Left;   Social History   Social History Narrative  . Not on file    Objective: Vital Signs: BP 128/83 (BP Location: Left Arm, Patient Position: Sitting, Cuff Size: Normal)   Pulse 86   Ht 5\' 8"  (1.727 m)   Wt 268 lb (121.6 kg)   BMI 40.75 kg/m    Physical Exam  Constitutional: He is oriented to person, place, and time. He appears well-developed and well-nourished.  HENT:  Head: Normocephalic and atraumatic.  Eyes: Pupils are equal, round, and reactive to light. Conjunctivae  and EOM are normal.  Neck: Normal range of motion. Neck supple.  Cardiovascular: Normal rate, regular rhythm and normal heart sounds.  Pulmonary/Chest: Effort normal and breath sounds normal.  Abdominal: Soft. Bowel sounds are normal.  Neurological: He is alert and oriented to person, place, and time.  Skin: Skin is warm and dry. Capillary refill takes less than 2 seconds.  Psychiatric: He has a normal mood and affect. His behavior is normal.  Nursing note and vitals reviewed.    Musculoskeletal Exam: C-spine thoracic lumbar spine limited range of motion with discomfort on range of motion of his lumbar spine.  Shoulder joints elbow joints wrist joints were in good range of motion.  He has DIP and PIP thickening in his bilateral hands.  No synovitis was noted.  Hip joints were in good range of motion.  His bilateral knee joints are replaced.  CDAI Exam: CDAI Score: 0.7  Patient Global Assessment: 5 (mm); Provider Global Assessment: 2 (mm) Swollen: 0 ; Tender: 0  Joint Exam   Not documented   There is currently no information documented on the  homunculus. Go to the Rheumatology activity and complete the homunculus joint exam.  Investigation: No additional findings.  Imaging: No results found.  Recent Labs: Lab Results  Component Value Date   WBC 7.5 12/13/2017   HGB 14.7 12/13/2017   PLT 254 12/13/2017   NA 136 12/13/2017   K 4.4 12/13/2017   CL 100 12/13/2017   CO2 29 12/13/2017   GLUCOSE 67 12/13/2017   BUN 15 12/13/2017   CREATININE 1.05 12/13/2017   BILITOT 0.6 12/13/2017   ALKPHOS 69 08/21/2017   AST 29 12/13/2017   ALT 22 12/13/2017   PROT 6.5 12/13/2017   ALBUMIN 4.3 08/21/2017   CALCIUM 9.2 12/13/2017   GFRAA 85 12/13/2017    Speciality Comments: PLQ Eye Exam: 03/27/17 WNL @ [facility]   Procedures:  No procedures performed Allergies: Codeine; Dilaudid [hydromorphone hcl]; Fentanyl; Nucynta [tapentadol]; and Penicillins   Assessment / Plan:     Visit  Diagnoses: Rheumatoid arthritis of multiple sites with negative rheumatoid factor (HCC)-patient had no synovitis on examination.  He complains of arthralgia which I believe is coming from osteoarthritis.  High risk medication use - MTX 1 mL subcu weekly, PLQ 200 mg p.o. twice daily, SSZ 500 mg 2 tablets p.o. twice daily, folic acid 2 mg p.o. eye exam: 03/27/2017.  His labs have been stable.  He has been reminded of getting his eye exam.  Primary osteoarthritis of both hands-he has severe osteoarthritis which causes decreased grip strength.  Hx of total knee replacement, bilateral-doing well.  DDD (degenerative disc disease), cervical-he has limitation with range of motion.  DDD (degenerative disc disease), lumbar - with spinal stenosis.Marland Kitchen  He has been having right-sided radiculopathy which is causing discomfort.  Have advised him to see his neurosurgeon.  History of melanoma  Neuralgia  History of obesity   Orders: No orders of the defined types were placed in this encounter.  No orders of the defined types were placed in this encounter.   Face-to-face time spent with patient was 30 minutes. Greater than 50% of time was spent in counseling and coordination of care.  Follow-Up Instructions: Return in about 5 months (around 07/08/2018) for Rheumatoid arthritis, Osteoarthritis.   Bo Merino, MD  Note - This record has been created using Editor, commissioning.  Chart creation errors have been sought, but may not always  have been located. Such creation errors do not reflect on  the standard of medical care.

## 2018-02-05 ENCOUNTER — Ambulatory Visit (INDEPENDENT_AMBULATORY_CARE_PROVIDER_SITE_OTHER): Payer: Medicare Other | Admitting: Rheumatology

## 2018-02-05 ENCOUNTER — Encounter: Payer: Self-pay | Admitting: Rheumatology

## 2018-02-05 VITALS — BP 128/83 | HR 86 | Ht 68.0 in | Wt 268.0 lb

## 2018-02-05 DIAGNOSIS — M5136 Other intervertebral disc degeneration, lumbar region: Secondary | ICD-10-CM | POA: Diagnosis not present

## 2018-02-05 DIAGNOSIS — M5030 Other cervical disc degeneration, unspecified cervical region: Secondary | ICD-10-CM

## 2018-02-05 DIAGNOSIS — M19041 Primary osteoarthritis, right hand: Secondary | ICD-10-CM | POA: Diagnosis not present

## 2018-02-05 DIAGNOSIS — M0609 Rheumatoid arthritis without rheumatoid factor, multiple sites: Secondary | ICD-10-CM | POA: Diagnosis not present

## 2018-02-05 DIAGNOSIS — Z85820 Personal history of malignant melanoma of skin: Secondary | ICD-10-CM

## 2018-02-05 DIAGNOSIS — M19042 Primary osteoarthritis, left hand: Secondary | ICD-10-CM

## 2018-02-05 DIAGNOSIS — Z8639 Personal history of other endocrine, nutritional and metabolic disease: Secondary | ICD-10-CM | POA: Diagnosis not present

## 2018-02-05 DIAGNOSIS — M51369 Other intervertebral disc degeneration, lumbar region without mention of lumbar back pain or lower extremity pain: Secondary | ICD-10-CM

## 2018-02-05 DIAGNOSIS — M792 Neuralgia and neuritis, unspecified: Secondary | ICD-10-CM

## 2018-02-05 DIAGNOSIS — Z96653 Presence of artificial knee joint, bilateral: Secondary | ICD-10-CM

## 2018-02-05 DIAGNOSIS — Z79899 Other long term (current) drug therapy: Secondary | ICD-10-CM | POA: Diagnosis not present

## 2018-02-05 NOTE — Patient Instructions (Signed)
Standing Labs We placed an order today for your standing lab work.   Please come back and get your standing labs in October and every 3 months   We have open lab Monday through Friday from 8:30-11:30 AM and 1:30-4:00 PM  at the office of Dr. Sharonann Malbrough.   You may experience shorter wait times on Monday and Friday afternoons. The office is located at [address], [address], [place], [place] [address] No appointment is necessary.   Labs are drawn by [facility].  You may receive a bill from [facility] for your lab work. If you have any questions regarding directions or hours of operation,  please call [phone].    

## 2018-02-12 ENCOUNTER — Ambulatory Visit: Payer: Medicare Other | Admitting: Rheumatology

## 2018-02-18 ENCOUNTER — Other Ambulatory Visit: Payer: Self-pay | Admitting: Rheumatology

## 2018-02-18 NOTE — Telephone Encounter (Signed)
Last Visit : 02/05/18 Next visit : 07/09/18 Labs:  12/13/17 CMP WNL.CBC WNL.  Okay to refill per Dr. Estanislado Pandy

## 2018-03-22 ENCOUNTER — Other Ambulatory Visit: Payer: Self-pay

## 2018-03-22 DIAGNOSIS — Z79899 Other long term (current) drug therapy: Secondary | ICD-10-CM

## 2018-03-22 LAB — COMPLETE METABOLIC PANEL WITH GFR
AG Ratio: 1.5 (calc) (ref 1.0–2.5)
ALT: 31 U/L (ref 9–46)
AST: 38 U/L — ABNORMAL HIGH (ref 10–35)
Albumin: 4.2 g/dL (ref 3.6–5.1)
Alkaline phosphatase (APISO): 74 U/L (ref 40–115)
BUN: 16 mg/dL (ref 7–25)
CO2: 30 mmol/L (ref 20–32)
Calcium: 10 mg/dL (ref 8.6–10.3)
Chloride: 100 mmol/L (ref 98–110)
Creat: 1.15 mg/dL (ref 0.70–1.25)
GFR, Est African American: 76 mL/min/{1.73_m2} (ref >=60)
GFR, Est Non African American: 66 mL/min/{1.73_m2} (ref >=60)
Globulin: 2.8 g/dL (calc) (ref 1.9–3.7)
Glucose, Bld: 102 mg/dL — ABNORMAL HIGH (ref 65–99)
Potassium: 4.9 mmol/L (ref 3.5–5.3)
Sodium: 141 mmol/L (ref 135–146)
Total Bilirubin: 0.4 mg/dL (ref 0.2–1.2)
Total Protein: 7 g/dL (ref 6.1–8.1)

## 2018-03-22 LAB — CBC WITH DIFFERENTIAL/PLATELET
Basophils Absolute: 53 cells/uL (ref 0–200)
Basophils Relative: 0.7 %
Eosinophils Absolute: 190 cells/uL (ref 15–500)
Eosinophils Relative: 2.5 %
HCT: 45.6 % (ref 38.5–50.0)
Hemoglobin: 15.5 g/dL (ref 13.2–17.1)
Lymphs Abs: 2668 cells/uL (ref 850–3900)
MCH: 31.2 pg (ref 27.0–33.0)
MCHC: 34 g/dL (ref 32.0–36.0)
MCV: 91.8 fL (ref 80.0–100.0)
MPV: 9.5 fL (ref 7.5–12.5)
Monocytes Relative: 12.7 %
Neutro Abs: 3724 cells/uL (ref 1500–7800)
Neutrophils Relative %: 49 %
Platelets: 318 10*3/uL (ref 140–400)
RBC: 4.97 10*6/uL (ref 4.20–5.80)
RDW: 14 % (ref 11.0–15.0)
Total Lymphocyte: 35.1 %
WBC mixed population: 965 cells/uL — ABNORMAL HIGH (ref 200–950)
WBC: 7.6 10*3/uL (ref 3.8–10.8)

## 2018-03-25 NOTE — Progress Notes (Signed)
LFTs are mildly elevated.  Please advise patient not to drink any alcohol and to do not to take any NSAIDs.  We will continue to monitor labs.

## 2018-03-26 ENCOUNTER — Telehealth: Payer: Self-pay | Admitting: Rheumatology

## 2018-03-26 ENCOUNTER — Other Ambulatory Visit: Payer: Self-pay | Admitting: Rheumatology

## 2018-03-26 MED ORDER — SULFASALAZINE 500 MG PO TABS: 1000.0000 mg | ORAL_TABLET | Freq: Two times a day (BID) | ORAL | 0 refills | Status: DC

## 2018-03-26 NOTE — Telephone Encounter (Signed)
Patient's wife David [name] left a voicemail requesting a return call.

## 2018-03-26 NOTE — Telephone Encounter (Signed)
Last Visit: 02/05/18 Next Visit: 07/09/18 Labs: 03/22/18 LFTs are mildly elevated

## 2018-03-27 MED ORDER — SULFASALAZINE 500 MG PO TBEC: 1000.0000 mg | DELAYED_RELEASE_TABLET | Freq: Two times a day (BID) | ORAL | 0 refills | Status: DC

## 2018-03-27 NOTE — Addendum Note (Signed)
Addended by: Carole Binning on: 03/27/2018 02:32 PM   Modules accepted: Orders

## 2018-04-02 ENCOUNTER — Other Ambulatory Visit: Payer: Self-pay | Admitting: *Deleted

## 2018-04-02 MED ORDER — FOLIC ACID 1 MG PO TABS: 2.0000 mg | ORAL_TABLET | Freq: Every day | ORAL | 4 refills | Status: DC

## 2018-04-02 NOTE — Telephone Encounter (Signed)
Refill request received via fax  Last Visit : 02/05/18 Next visit : 07/09/18  Okay to refill per Dr. Estanislado Pandy

## 2018-04-04 ENCOUNTER — Telehealth: Payer: Self-pay | Admitting: Pharmacist

## 2018-04-04 NOTE — Telephone Encounter (Signed)
Received fax from [facility] stating patient sulfasalazine enteric-coated tablets were unavailable.  Called patient to see how they would like [place] to proceed.  We can call in immediate release sulfasalazine which is associated with more GI upset or they can try to send the prescription to another pharmacy.  Left voicemail for patient to return our call.

## 2018-05-01 ENCOUNTER — Other Ambulatory Visit: Payer: Self-pay | Admitting: Rheumatology

## 2018-05-01 NOTE — Telephone Encounter (Signed)
Last Visit : 02/05/18 Next visit : 07/09/18 Labs: 03/22/18 LFTs are mildly elevated.  PLQ Eye Exam: 03/27/17 WNL  Okay to refill per Dr. Estanislado Pandy

## 2018-06-11 DIAGNOSIS — R0789 Other chest pain: Secondary | ICD-10-CM | POA: Diagnosis not present

## 2018-06-26 NOTE — Progress Notes (Signed)
Office Visit Note  Patient: David [name]             Date of Birth: March 26, 1951           MRN: [id number]             PCP: Orpah Melter, MD Referring: Orpah Melter, MD Visit Date: 07/09/2018 Occupation: @GUAROCC @  Subjective:  Pain in hands.    History of Present Illness: David [name] is a 68 y.o. male with history of rheumatoid arthritis and osteoarthritis overlap.  He states he has been having intermittent swelling in his knuckles especially after doing certain activities.  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  He also describes some stabbing sharp pain in different parts of his body which lasts only a few seconds.  He is has difficulty in the morning and also at nighttime.  He states afternoons and evenings get better.  Bilateral total knee replacements are doing well.  He denies any neck or lower back discomfort at this time.  Activities of Daily Living:  Patient reports morning stiffness for 4 hours.   Patient Reports nocturnal pain.  Difficulty dressing/grooming: Denies Difficulty climbing stairs: Denies Difficulty getting out of chair: Denies Difficulty using hands for taps, buttons, cutlery, and/or writing: Denies  Review of Systems  Constitutional: Positive for fatigue. Negative for night sweats.  HENT: Positive for mouth dryness. Negative for mouth sores and nose dryness.   Eyes: Negative for redness and dryness.  Respiratory: Negative for shortness of breath and difficulty breathing.   Cardiovascular: Negative for chest pain, palpitations, hypertension, irregular heartbeat and swelling in legs/feet.  Gastrointestinal: Negative for constipation and diarrhea.  Endocrine: Negative for increased urination.  Musculoskeletal: Positive for arthralgias, joint pain and morning stiffness. Negative for joint swelling, myalgias, muscle weakness, muscle tenderness and myalgias.  Skin: Negative for color change, rash, hair loss,  nodules/bumps, skin tightness, ulcers and sensitivity to sunlight.  Allergic/Immunologic: Negative for susceptible to infections.  Neurological: Negative for dizziness, fainting, memory loss, night sweats and weakness ( ).  Hematological: Negative for swollen glands.  Psychiatric/Behavioral: Positive for sleep disturbance. Negative for depressed mood. The patient is not nervous/anxious.     PMFS History:  Patient Active Problem List   Diagnosis Date Noted  . SBO (small bowel obstruction) ([place]) 08/21/2017  . Trochanteric bursitis of left hip 01/18/2017  . Lateral epicondylitis, right elbow 01/18/2017  . Rheumatoid arthritis ([place]) 03/15/2016  . DJD (degenerative joint disease), cervical 03/15/2016  . DDD (degenerative disc disease), lumbar 03/15/2016  . HTN (hypertension) 03/15/2016  . Obesity 03/15/2016  . Elevated cholesterol 03/15/2016  . Neuralgia 03/15/2016  . Malignant melanoma of right side of neck ([place]) 03/15/2016  . BBB (bundle branch block) 03/15/2016  . High risk medication use 03/15/2016  . Osteoarthritis of lumbar spine 03/15/2016  . Primary osteoarthritis of both hands 03/15/2016  . Primary osteoarthritis of both knees 03/15/2016  . Mesenteric adenitis 06/29/2015    Past Medical History:  Diagnosis Date  . Arthritis   . BBB (bundle branch block) 03/15/2016  . Carpal tunnel syndrome   . DDD (degenerative disc disease), lumbar 03/15/2016  . Depression   . Dysrhythmia    had some tachycardia with steroids  . Elevated cholesterol 03/15/2016  . GERD (gastroesophageal reflux disease)    no meds since gastric banding  . Headache(784.0)    Ocular Migraines - takes Ambien for it  . HTN (hypertension) 03/15/2016  . Hyperlipemia   .  Hypertension   . Malignant melanoma of right side of neck ([place]) 03/15/2016   30 years ago   . Neuralgia 03/15/2016  . Rheumatoid arthritis ([place]) 03/15/2016   Sero Negative.   Marland Kitchen RLS (restless legs syndrome)   . Shortness of breath     DOE  . Sleep apnea    could not use a cpap  . Snores   . Status post gastric banding   . Wears glasses    driving    Family History  Problem Relation Age of Onset  . Heart disease Mother   . Alzheimer's disease Father    Past Surgical History:  Procedure Laterality Date  . CARPAL TUNNEL RELEASE Right 09/26/2012   Procedure: CARPAL TUNNEL RELEASE;  Surgeon: Tennis Must, MD;  Location: [facility];  Service: Orthopedics;  Laterality: Right;  . CARPAL TUNNEL RELEASE Left 10/24/2012   Procedure: CARPAL TUNNEL RELEASE;  Surgeon: Tennis Must, MD;  Location: [facility];  Service: Orthopedics;  Laterality: Left;  . EXCISION MELANOMA WITH SENTINEL LYMPH NODE BIOPSY  1984   rt neck with mulpipal nodes and some muscle excised rt neck-shoulder  . KNEE ARTHROPLASTY    . knee replacement    . LAPAROSCOPIC GASTRIC BANDING  03/15/2009  . LAPAROSCOPIC GASTRIC BANDING    . MUSCLE BIOPSY  2004   rt thigh to r/o myocitis  . TONSILLECTOMY    . TOTAL KNEE ARTHROPLASTY Bilateral 12/05/2010  . TRIGGER FINGER RELEASE Right 09/26/2012   Procedure: RELEASE TRIGGER FINGER/A-1 PULLEY RING AND SMALL ;  Surgeon: Tennis Must, MD;  Location: [facility];  Service: Orthopedics;  Laterality: Right;  . TRIGGER FINGER RELEASE Left 10/24/2012   Procedure: RELEASE TRIGGER FINGER/A-1 PULLEY LEFT MIDDLE AND LEFT RING;  Surgeon: Tennis Must, MD;  Location: [facility];  Service: Orthopedics;  Laterality: Left;   Social History   Social History Narrative  . Not on file    There is no immunization history on file for this patient.   Objective: Vital Signs: BP 124/78 (BP Location: Left Arm, Patient Position: Sitting, Cuff Size: Large)   Pulse 80   Resp 15   Ht 5\' 8"  (1.727 m)   Wt 243 lb 12.8 oz (110.6 kg)   BMI 37.07 kg/m    Physical Exam Vitals signs and nursing note reviewed.  Constitutional:      Appearance: He is well-developed.  HENT:     Head:  Normocephalic and atraumatic.  Eyes:     Conjunctiva/sclera: Conjunctivae normal.     Pupils: Pupils are equal, round, and reactive to light.  Neck:     Musculoskeletal: Normal range of motion and neck supple.  Cardiovascular:     Rate and Rhythm: Normal rate and regular rhythm.     Heart sounds: Normal heart sounds.  Pulmonary:     Effort: Pulmonary effort is normal.     Breath sounds: Normal breath sounds.  Abdominal:     General: Bowel sounds are normal.     Palpations: Abdomen is soft.  Skin:    General: Skin is warm and dry.     Capillary Refill: Capillary refill takes less than 2 seconds.  Neurological:     Mental Status: He is alert and oriented to person, place, and time.  Psychiatric:        Behavior: Behavior normal.      Musculoskeletal Exam: C-spine good range of motion.  Lumbar spine limited range of  motion.  He has discomfort and limited range of motion in his right shoulder due to melanoma surgery.  Left shoulder joint was in full range of motion.  Elbow joints wrist joints were in good range of motion.  He has no synovitis over MCPs.  Synovial thickening over MCP joints.  PIP DIP and CMC thickening was noted.  Hip joints with good range of motion.  He has bilateral total knee replacement which appears to be doing well.  He has some discomfort over MTPs but no synovitis was noted.  CDAI Exam: CDAI Score: 0.8  Patient Global Assessment: 4 (mm); Provider Global Assessment: 4 (mm) Swollen: 0 ; Tender: 0  Joint Exam   Not documented   There is currently no information documented on the homunculus. Go to the Rheumatology activity and complete the homunculus joint exam.  Investigation: No additional findings.  Imaging: Xr Foot 2 Views Left  Result Date: 07/09/2018 PIP and DIP narrowing was noted.  First MTP minimal narrowing was noted.  No MTP joint narrowing was noted.  No erosive changes were noted.  No intertarsal or subtalar joint space narrowing was noted.  Impression: These findings are consistent with osteoarthritis of the foot.  Xr Foot 2 Views Right  Result Date: 07/09/2018 PIP and DIP narrowing was noted.  First MTP minimal narrowing was noted.  No MTP joint narrowing was noted.  No erosive changes were noted.  No intertarsal or subtalar joint space narrowing was noted.  Inferior and posterior calcaneal spurs were noted. Impression: These findings are consistent with osteoarthritis of the foot.  Xr Hand 2 View Left  Result Date: 07/09/2018 DIP PIP and CMC narrowing was noted.  Juxta-articular osteopenia was noted.  No MCP intercarpal radiocarpal joint space narrowing was noted.  No erosive changes were noted. Impression: These findings are consistent with rheumatoid arthritis and osteoarthritis overlap.  Xr Hand 2 View Right  Result Date: 07/09/2018 DIP and PIP narrowing was noted.  No MCP intercarpal or radiocarpal joint space narrowing was noted.  No erosive changes were noted.  These findings are consistent with osteoarthritis.  Mild juxta-articular osteopenia was noted. Impression: These findings are consistent with rheumatoid arthritis and osteoarthritis overlap.   Recent Labs: Lab Results  Component Value Date   WBC 7.6 03/22/2018   HGB 15.5 03/22/2018   PLT 318 03/22/2018   NA 141 03/22/2018   K 4.9 03/22/2018   CL 100 03/22/2018   CO2 30 03/22/2018   GLUCOSE 102 (H) 03/22/2018   BUN 16 03/22/2018   CREATININE 1.15 03/22/2018   BILITOT 0.4 03/22/2018   ALKPHOS 69 08/21/2017   AST 38 (H) 03/22/2018   ALT 31 03/22/2018   PROT 7.0 03/22/2018   ALBUMIN 4.3 08/21/2017   CALCIUM 10.0 03/22/2018   GFRAA 76 03/22/2018    Speciality Comments: PLQ Eye Exam: 03/27/17 WNL @ [facility]   Procedures:  No procedures performed Allergies: Codeine; Dilaudid [hydromorphone hcl]; Fentanyl; Nucynta [tapentadol]; and Penicillins   Assessment / Plan:     Visit Diagnoses: Rheumatoid arthritis of multiple sites with negative rheumatoid  factor (HCC) -patient complains of pain and discomfort in his hands and feet.  No synovitis was noted on examination. I believe his discomfort is coming from underlying osteoarthritis.  Plan: XR Hand 2 View Right, XR Hand 2 View Left, XR Foot 2 Views Right, XR Foot 2 Views Left.  The x-rays were consistent with rheumatoid arthritis and osteoarthritis overlap without any radiographic progression.  High risk medication  use - MTX 1 mL subcu weekly, PLQ 200 mg p.o. twice daily, SSZ 500 mg 2 tablets p.o. twice daily, folic acid 2 mg p.o.Eye Exam: 03/27/17 -his last labs showed mild elevation of LFTs.  Plan: CBC with Differential/Platelet, COMPLETE METABOLIC PANEL WITH GFR  Primary osteoarthritis of both hands-he has severe osteoarthritis in his hands.  He has been having discomfort over bilateral CMC joints.  Joint protection muscle strengthening was discussed.  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  Hx of total knee replacement, bilateral-he is not having much discomfort.  DDD (degenerative disc disease), cervical-he has chronic discomfort.  DDD (degenerative disc disease), lumbar - with spinal stenosis-chronic pain.  Neuralgia-he continues to have neurology has which cause discomfort.  He states he recently switched from gabapentin to pregabalin.  History of obesity  History of melanoma   Orders: Orders Placed This Encounter  Procedures  . XR Hand 2 View Right  . XR Hand 2 View Left  . XR Foot 2 Views Right  . XR Foot 2 Views Left  . CBC with Differential/Platelet  . COMPLETE METABOLIC PANEL WITH GFR   Meds ordered this encounter  Medications  . diclofenac sodium (VOLTAREN) 1 % GEL    Sig: Voltaren Gel 3 grams to 3 large joints upto TID 3 TUBES with 3 refills    Dispense:  3 Tube    Refill:  3    Voltaren Gel 3 grams to 3 large joints upto TID 3 TUBES with 3 refills    Face-to-face time spent with patient was 30 minutes. Greater than 50% of time was spent in  counseling and coordination of care.  Follow-Up Instructions: Return in about 5 months (around 12/07/2018) for Rheumatoid arthritis, Osteoarthritis.   Bo Merino, MD  Note - This record has been created using Editor, commissioning.  Chart creation errors have been sought, but may not always  have been located. Such creation errors do not reflect on  the standard of medical care.

## 2018-06-28 ENCOUNTER — Other Ambulatory Visit: Payer: Self-pay | Admitting: Rheumatology

## 2018-06-28 NOTE — Telephone Encounter (Addendum)
Last Visit : 02/05/18 Next visit : 07/09/18 Labs: 03/22/18 LFTs are mildly elevated.  Left message to advise patient he is due to update labs.  Okay to refill 30 day supply per Dr. Estanislado Pandy

## 2018-07-09 ENCOUNTER — Ambulatory Visit (INDEPENDENT_AMBULATORY_CARE_PROVIDER_SITE_OTHER): Payer: Self-pay

## 2018-07-09 ENCOUNTER — Ambulatory Visit (INDEPENDENT_AMBULATORY_CARE_PROVIDER_SITE_OTHER): Payer: Medicare Other

## 2018-07-09 ENCOUNTER — Encounter: Payer: Self-pay | Admitting: Rheumatology

## 2018-07-09 ENCOUNTER — Ambulatory Visit (INDEPENDENT_AMBULATORY_CARE_PROVIDER_SITE_OTHER): Payer: Medicare Other | Admitting: Rheumatology

## 2018-07-09 VITALS — BP 124/78 | HR 80 | Resp 15 | Ht 68.0 in | Wt 243.8 lb

## 2018-07-09 DIAGNOSIS — M0609 Rheumatoid arthritis without rheumatoid factor, multiple sites: Secondary | ICD-10-CM | POA: Diagnosis not present

## 2018-07-09 DIAGNOSIS — M5030 Other cervical disc degeneration, unspecified cervical region: Secondary | ICD-10-CM | POA: Diagnosis not present

## 2018-07-09 DIAGNOSIS — Z96653 Presence of artificial knee joint, bilateral: Secondary | ICD-10-CM

## 2018-07-09 DIAGNOSIS — Z8639 Personal history of other endocrine, nutritional and metabolic disease: Secondary | ICD-10-CM

## 2018-07-09 DIAGNOSIS — M19041 Primary osteoarthritis, right hand: Secondary | ICD-10-CM | POA: Diagnosis not present

## 2018-07-09 DIAGNOSIS — Z85820 Personal history of malignant melanoma of skin: Secondary | ICD-10-CM | POA: Diagnosis not present

## 2018-07-09 DIAGNOSIS — Z79899 Other long term (current) drug therapy: Secondary | ICD-10-CM | POA: Diagnosis not present

## 2018-07-09 DIAGNOSIS — M792 Neuralgia and neuritis, unspecified: Secondary | ICD-10-CM | POA: Diagnosis not present

## 2018-07-09 DIAGNOSIS — M5136 Other intervertebral disc degeneration, lumbar region: Secondary | ICD-10-CM

## 2018-07-09 DIAGNOSIS — M19042 Primary osteoarthritis, left hand: Secondary | ICD-10-CM | POA: Diagnosis not present

## 2018-07-09 LAB — CBC WITH DIFFERENTIAL/PLATELET
Absolute Monocytes: 1008 cells/uL — ABNORMAL HIGH (ref 200–950)
Basophils Absolute: 50 cells/uL (ref 0–200)
Basophils Relative: 0.7 %
Eosinophils Absolute: 223 cells/uL (ref 15–500)
Eosinophils Relative: 3.1 %
HCT: 44.8 % (ref 38.5–50.0)
Hemoglobin: 15.1 g/dL (ref 13.2–17.1)
Lymphs Abs: 2225 cells/uL (ref 850–3900)
MCH: 31.1 pg (ref 27.0–33.0)
MCHC: 33.7 g/dL (ref 32.0–36.0)
MCV: 92.2 fL (ref 80.0–100.0)
MPV: 9.6 fL (ref 7.5–12.5)
Monocytes Relative: 14 %
Neutro Abs: 3694 cells/uL (ref 1500–7800)
Neutrophils Relative %: 51.3 %
Platelets: 240 10*3/uL (ref 140–400)
RBC: 4.86 10*6/uL (ref 4.20–5.80)
RDW: 14.2 % (ref 11.0–15.0)
Total Lymphocyte: 30.9 %
WBC: 7.2 10*3/uL (ref 3.8–10.8)

## 2018-07-09 LAB — COMPLETE METABOLIC PANEL WITH GFR
AG Ratio: 1.6 (calc) (ref 1.0–2.5)
ALT: 25 U/L (ref 9–46)
AST: 35 U/L (ref 10–35)
Albumin: 4.2 g/dL (ref 3.6–5.1)
Alkaline phosphatase (APISO): 81 U/L (ref 35–144)
BUN: 16 mg/dL (ref 7–25)
CO2: 28 mmol/L (ref 20–32)
Calcium: 9.8 mg/dL (ref 8.6–10.3)
Chloride: 100 mmol/L (ref 98–110)
Creat: 1.08 mg/dL (ref 0.70–1.25)
GFR, Est African American: 82 mL/min/{1.73_m2} (ref >=60)
GFR, Est Non African American: 71 mL/min/{1.73_m2} (ref >=60)
Globulin: 2.6 g/dL (calc) (ref 1.9–3.7)
Glucose, Bld: 92 mg/dL (ref 65–99)
Potassium: 4.3 mmol/L (ref 3.5–5.3)
Sodium: 135 mmol/L (ref 135–146)
Total Bilirubin: 0.5 mg/dL (ref 0.2–1.2)
Total Protein: 6.8 g/dL (ref 6.1–8.1)

## 2018-07-09 MED ORDER — DICLOFENAC SODIUM 1 % TD GEL: TRANSDERMAL | 3 refills | Status: DC

## 2018-07-09 NOTE — Patient Instructions (Addendum)
Standing Labs We placed an order today for your standing lab work.    Please come back and get your standing labs in May and every 3 months  We have open lab Monday through Friday from 8:30-11:30 AM and 1:30-4:00 PM  at the office of Dr. Kaylob Wallen.   You may experience shorter wait times on Monday and Friday afternoons. The office is located at [address], [address], [place], [place] [address] No appointment is necessary.   Labs are drawn by [facility].  You may receive a bill from [facility] for your lab work.  If you wish to have your labs drawn at another location, please call the office 24 hours in advance to send orders.  If you have any questions regarding directions or hours of operation,  please call [phone].   Just as a reminder please drink plenty of water prior to coming for your lab work. Thanks!  

## 2018-08-09 ENCOUNTER — Other Ambulatory Visit: Payer: Self-pay | Admitting: Rheumatology

## 2018-08-12 ENCOUNTER — Other Ambulatory Visit: Payer: Self-pay | Admitting: Rheumatology

## 2018-08-12 NOTE — Telephone Encounter (Signed)
Last Visit: 07/09/2018 Next Visit: 12/04/2018 Labs: 07/09/2018 Absolute monocytes are mildly elevated. Rest of CBC is WNL. CMP WNL.  Okay to refill per Dr. Estanislado Pandy.

## 2018-08-12 NOTE — Telephone Encounter (Signed)
Last Visit: 07/09/2018 Next Visit: 12/04/2018 Labs: 07/09/2018 Absolute monocytes are mildly elevated. Rest of CBC is WNL. CMP WNL. Eye exam: 03/27/2017  Advised patient we need recent eye exam, patient verbalized understanding. Patient states as soon as his eye doctors office opens back up, he will get one scheduled.   Okay to refill per Dr. Estanislado Pandy.

## 2018-09-14 ENCOUNTER — Encounter (HOSPITAL_BASED_OUTPATIENT_CLINIC_OR_DEPARTMENT_OTHER): Payer: Self-pay | Admitting: Emergency Medicine

## 2018-09-14 ENCOUNTER — Other Ambulatory Visit: Payer: Self-pay

## 2018-09-14 ENCOUNTER — Emergency Department (HOSPITAL_BASED_OUTPATIENT_CLINIC_OR_DEPARTMENT_OTHER)
Admission: EM | Admit: 2018-09-14 | Discharge: 2018-09-14 | Disposition: A | Discharge status: Home / Self Care | Payer: Medicare Other | Attending: Emergency Medicine | Admitting: Emergency Medicine

## 2018-09-14 DIAGNOSIS — Z87891 Personal history of nicotine dependence: Secondary | ICD-10-CM | POA: Insufficient documentation

## 2018-09-14 DIAGNOSIS — Z79899 Other long term (current) drug therapy: Secondary | ICD-10-CM | POA: Diagnosis not present

## 2018-09-14 DIAGNOSIS — R600 Localized edema: Secondary | ICD-10-CM | POA: Diagnosis not present

## 2018-09-14 DIAGNOSIS — I10 Essential (primary) hypertension: Secondary | ICD-10-CM | POA: Diagnosis not present

## 2018-09-14 DIAGNOSIS — M069 Rheumatoid arthritis, unspecified: Secondary | ICD-10-CM | POA: Diagnosis not present

## 2018-09-14 DIAGNOSIS — M7989 Other specified soft tissue disorders: Secondary | ICD-10-CM | POA: Diagnosis not present

## 2018-09-14 DIAGNOSIS — M79603 Pain in arm, unspecified: Secondary | ICD-10-CM | POA: Diagnosis present

## 2018-09-14 LAB — CBC WITH DIFFERENTIAL/PLATELET
Abs Immature Granulocytes: 0.03 10*3/uL (ref 0.00–0.07)
Basophils Absolute: 0 10*3/uL (ref 0.0–0.1)
Basophils Relative: 1 %
Eosinophils Absolute: 0.1 10*3/uL (ref 0.0–0.5)
Eosinophils Relative: 2 %
HCT: 43.9 % (ref 39.0–52.0)
Hemoglobin: 14.4 g/dL (ref 13.0–17.0)
Immature Granulocytes: 0 %
Lymphocytes Relative: 23 %
Lymphs Abs: 1.9 10*3/uL (ref 0.7–4.0)
MCH: 30.4 pg (ref 26.0–34.0)
MCHC: 32.8 g/dL (ref 30.0–36.0)
MCV: 92.8 fL (ref 80.0–100.0)
Monocytes Absolute: 1.1 10*3/uL — ABNORMAL HIGH (ref 0.1–1.0)
Monocytes Relative: 13 %
Neutro Abs: 5 10*3/uL (ref 1.7–7.7)
Neutrophils Relative %: 61 %
Platelets: 226 10*3/uL (ref 150–400)
RBC: 4.73 MIL/uL (ref 4.22–5.81)
RDW: 14.7 % (ref 11.5–15.5)
WBC: 8.2 10*3/uL (ref 4.0–10.5)
nRBC: 0 % (ref 0.0–0.2)

## 2018-09-14 LAB — COMPREHENSIVE METABOLIC PANEL
ALT: 30 U/L (ref 0–44)
AST: 36 U/L (ref 15–41)
Albumin: 3.8 g/dL (ref 3.5–5.0)
Alkaline Phosphatase: 72 U/L (ref 38–126)
Anion gap: 8 (ref 5–15)
BUN: 21 mg/dL (ref 8–23)
CO2: 28 mmol/L (ref 22–32)
Calcium: 9.1 mg/dL (ref 8.9–10.3)
Chloride: 101 mmol/L (ref 98–111)
Creatinine, Ser: 0.93 mg/dL (ref 0.61–1.24)
GFR calc Af Amer: >60 mL/min (ref >60)
GFR calc non Af Amer: >60 mL/min (ref >60)
Glucose, Bld: 101 mg/dL — ABNORMAL HIGH (ref 70–99)
Potassium: 3.2 mmol/L — ABNORMAL LOW (ref 3.5–5.1)
Sodium: 137 mmol/L (ref 135–145)
Total Bilirubin: 0.6 mg/dL (ref 0.3–1.2)
Total Protein: 6.6 g/dL (ref 6.5–8.1)

## 2018-09-14 LAB — PROTIME-INR
INR: 0.9 (ref 0.8–1.2)
Prothrombin Time: 12 seconds (ref 11.4–15.2)

## 2018-09-14 MED ORDER — SODIUM CHLORIDE 0.9 % IV SOLN
INTRAVENOUS | Status: DC | PRN
  Administered 2018-09-14: 16:00:00 500 mL via INTRAVENOUS

## 2018-09-14 MED ORDER — MORPHINE SULFATE (PF) 4 MG/ML IV SOLN
8.0000 mg | Freq: Once | INTRAVENOUS | Status: AC
  Administered 2018-09-14: 16:00:00 8 mg via INTRAVENOUS
  Filled 2018-09-14: qty 2

## 2018-09-14 MED ORDER — PREDNISONE 20 MG PO TABS: ORAL_TABLET | ORAL | 0 refills | Status: DC

## 2018-09-14 MED ORDER — PREDNISONE 50 MG PO TABS
60.0000 mg | ORAL_TABLET | Freq: Once | ORAL | Status: AC
  Administered 2018-09-14: 60 mg via ORAL
  Filled 2018-09-14: qty 1

## 2018-09-14 MED ORDER — POTASSIUM CHLORIDE CRYS ER 20 MEQ PO TBCR
40.0000 meq | EXTENDED_RELEASE_TABLET | Freq: Once | ORAL | Status: AC
  Administered 2018-09-14: 40 meq via ORAL
  Filled 2018-09-14: qty 2

## 2018-09-14 NOTE — ED Provider Notes (Signed)
[facility] EMERGENCY DEPARTMENT Provider Note   CSN: [id number] Arrival date & time: 09/14/18  1512    History   Chief Complaint Chief Complaint  Patient presents with  . Arm Pain    HPI David [name] is a 68 y.o. male.     HPI  68 year old male presents with bilateral arm pain.  Started 2 days ago.  Yesterday he noticed bruising, especially to his left wrist.  No trauma noted.  No fevers, shortness of breath, or chest pain.  The swelling is mostly in his left wrist and hand.  He is also having pain in his right shoulder as well as milder pains in his left shoulder and right hand and wrist.  He has a history of rheumatoid arthritis and states that he is never had pain this bad though this pain does feel somewhat similar in quality.  At times the pain will suddenly exacerbate and get worse, especially with movement of his left wrist.  He also feels like the dorsal aspects of his hands are darker.  However there is no diffuse swelling to his extremities.  He is not on a blood thinner.  He is been taking hydrocodone for the pain and at times taking aspirin/Tylenol/caffeine combination medicine. No abnormal bleeding or bruising elsewhere. When to urgent care and was sent here for labwork. No trauma to arms.  Past Medical History:  Diagnosis Date  . Arthritis   . BBB (bundle branch block) 03/15/2016  . Carpal tunnel syndrome   . DDD (degenerative disc disease), lumbar 03/15/2016  . Depression   . Dysrhythmia    had some tachycardia with steroids  . Elevated cholesterol 03/15/2016  . GERD (gastroesophageal reflux disease)    no meds since gastric banding  . Headache(784.0)    Ocular Migraines - takes Ambien for it  . HTN (hypertension) 03/15/2016  . Hyperlipemia   . Hypertension   . Malignant melanoma of right side of neck ([place]) 03/15/2016   30 years ago   . Neuralgia 03/15/2016  . Rheumatoid arthritis ([place]) 03/15/2016   Sero Negative.   Marland Kitchen RLS (restless legs  syndrome)   . Shortness of breath    DOE  . Sleep apnea    could not use a cpap  . Snores   . Status post gastric banding   . Wears glasses    driving    Patient Active Problem List   Diagnosis Date Noted  . SBO (small bowel obstruction) ([place]) 08/21/2017  . Trochanteric bursitis of left hip 01/18/2017  . Lateral epicondylitis, right elbow 01/18/2017  . Rheumatoid arthritis ([place]) 03/15/2016  . DJD (degenerative joint disease), cervical 03/15/2016  . DDD (degenerative disc disease), lumbar 03/15/2016  . HTN (hypertension) 03/15/2016  . Obesity 03/15/2016  . Elevated cholesterol 03/15/2016  . Neuralgia 03/15/2016  . Malignant melanoma of right side of neck ([place]) 03/15/2016  . BBB (bundle branch block) 03/15/2016  . High risk medication use 03/15/2016  . Osteoarthritis of lumbar spine 03/15/2016  . Primary osteoarthritis of both hands 03/15/2016  . Primary osteoarthritis of both knees 03/15/2016  . Mesenteric adenitis 06/29/2015    Past Surgical History:  Procedure Laterality Date  . CARPAL TUNNEL RELEASE Right 09/26/2012   Procedure: CARPAL TUNNEL RELEASE;  Surgeon: Tennis Must, MD;  Location: [facility];  Service: Orthopedics;  Laterality: Right;  . CARPAL TUNNEL RELEASE Left 10/24/2012   Procedure: CARPAL TUNNEL RELEASE;  Surgeon: Tennis Must, MD;  Location: [facility];  Service: Orthopedics;  Laterality: Left;  . EXCISION MELANOMA WITH SENTINEL LYMPH NODE BIOPSY  1984   rt neck with mulpipal nodes and some muscle excised rt neck-shoulder  . KNEE ARTHROPLASTY    . knee replacement    . LAPAROSCOPIC GASTRIC BANDING  03/15/2009  . LAPAROSCOPIC GASTRIC BANDING    . MUSCLE BIOPSY  2004   rt thigh to r/o myocitis  . TONSILLECTOMY    . TOTAL KNEE ARTHROPLASTY Bilateral 12/05/2010  . TRIGGER FINGER RELEASE Right 09/26/2012   Procedure: RELEASE TRIGGER FINGER/A-1 PULLEY RING AND SMALL ;  Surgeon: Tennis Must, MD;  Location: [facility];  Service: Orthopedics;  Laterality: Right;  . TRIGGER FINGER RELEASE Left 10/24/2012   Procedure: RELEASE TRIGGER FINGER/A-1 PULLEY LEFT MIDDLE AND LEFT RING;  Surgeon: Tennis Must, MD;  Location: [facility];  Service: Orthopedics;  Laterality: Left;        Home Medications    Prior to Admission medications   Medication Sig Start Date End Date Taking? Authorizing Provider  diclofenac sodium (VOLTAREN) 1 % GEL Voltaren Gel 3 grams to 3 large joints upto TID 3 TUBES with 3 refills 07/09/18   Bo Merino, MD  DULoxetine (CYMBALTA) 60 MG capsule Take 60 mg by mouth 2 (two) times daily.     [provider]  folic acid (FOLVITE) 1 MG tablet Take 2 tablets (2 mg total) by mouth daily. 04/02/18 06/26/19  Bo Merino, MD  gabapentin (NEURONTIN) 300 MG capsule Take 900 mg by mouth 2 (two) times daily.  07/11/16   [provider]  hydrochlorothiazide (HYDRODIURIL) 25 MG tablet Take 25 mg by mouth daily.    [provider]  hydroxychloroquine (PLAQUENIL) 200 MG tablet TAKE ONE TABLET BY MOUTH TWICE A DAY 08/12/18   Bo Merino, MD  methotrexate 50 MG/2ML injection INJECT 1 ML INTO THE SKIN ONCE WEEKLY 08/12/18   Bo Merino, MD  metoprolol succinate (TOPROL-XL) 25 MG 24 hr tablet Take 25 mg by mouth daily. 11/24/14   [provider]  predniSONE (DELTASONE) 20 MG tablet 3 tabs po daily x 2 days, then 2 tabs x 3 days, then 1.5 tabs x 3 days, then 1 tab x 3 days, then 0.5 tabs x 3 days 09/15/18   Sherwood Gambler, MD  pregabalin (LYRICA) 25 MG capsule Take 25 mg by mouth 2 (two) times daily.    [provider]  rOPINIRole (REQUIP) 4 MG tablet Take 4 mg by mouth every evening.     [provider]  simvastatin (ZOCOR) 40 MG tablet Take 40 mg by mouth daily.    [provider]  sulfaSALAzine (AZULFIDINE) 500 MG EC tablet TAKE 2 TABLETS BY MOUTH TWICE DAILY 08/12/18   Bo Merino, MD  tamsulosin (FLOMAX)  0.4 MG CAPS capsule Take 0.4 mg by mouth daily.     [provider]  Tuberculin-Allergy Syringes 27G X 1/2" 1 ML MISC Patient to inject MTX once weekly 12/19/16   Bo Merino, MD  zolpidem (AMBIEN) 10 MG tablet Take 10 mg by mouth at bedtime as needed for sleep.    [provider]    Family History Family History  Problem Relation Age of Onset  . Heart disease Mother   . Alzheimer's disease Father     Social History Social History   Tobacco Use  . Smoking status: Former Smoker    Packs/day: 1.00    Years: 30.00    Pack years:  30.00    Types: Cigarettes    Last attempt to quit: 09/23/2001    Years since quitting: 16.9  . Smokeless tobacco: Never Used  Substance Use Topics  . Alcohol use: Yes    Comment: rare  . Drug use: No     Allergies   Codeine; Dilaudid [hydromorphone hcl]; Fentanyl; Nucynta [tapentadol]; and Penicillins   Review of Systems Review of Systems  Constitutional: Negative for fever.  Respiratory: Negative for shortness of breath.   Cardiovascular: Negative for chest pain.  Musculoskeletal: Positive for arthralgias and joint swelling.  All other systems reviewed and are negative.    Physical Exam Updated Vital Signs BP 132/87   Pulse 79   Temp 97.8 F (36.6 C) (Oral)   Resp 18   Ht 5\' 8"  (1.727 m)   Wt 117.9 kg   SpO2 98%   BMI 39.53 kg/m   Physical Exam Vitals signs and nursing note reviewed.  Constitutional:      Appearance: He is well-developed. He is obese.  HENT:     Head: Normocephalic and atraumatic.     Right Ear: External ear normal.     Left Ear: External ear normal.     Nose: Nose normal.  Eyes:     General:        Right eye: No discharge.        Left eye: No discharge.  Neck:     Musculoskeletal: Neck supple.  Cardiovascular:     Rate and Rhythm: Normal rate and regular rhythm.     Pulses:          Radial pulses are 2+ on the right side and 2+ on the left side.     Heart sounds: Normal heart  sounds.  Pulmonary:     Effort: Pulmonary effort is normal.     Breath sounds: Normal breath sounds.  Abdominal:     Palpations: Abdomen is soft.     Tenderness: There is no abdominal tenderness.  Musculoskeletal:     Right shoulder: He exhibits normal range of motion and no effusion.     Left shoulder: He exhibits normal range of motion and no swelling.     Right elbow: He exhibits normal range of motion and no swelling. No tenderness found.     Left elbow: He exhibits normal range of motion and no swelling. No tenderness found.     Right wrist: He exhibits normal range of motion, no tenderness and no swelling.     Left wrist: He exhibits tenderness and swelling.       Arms:     Right hand: He exhibits tenderness (mild). He exhibits no swelling.     Left hand: He exhibits tenderness and swelling (mild).     Comments: Normal active, passive ROM of upper extremities, including left forearm and wrist. No hot joints or erythema. Maybe some mild discoloration of dorsal hands, and some ecchymosis with tenderness to left forearm. No circumferential swelling or pitting edema.  Skin:    General: Skin is warm and dry.  Neurological:     Mental Status: He is alert.  Psychiatric:        Mood and Affect: Mood is not anxious.      ED Treatments / Results  Labs (all labs ordered are listed, but only abnormal results are displayed) Labs Reviewed  COMPREHENSIVE METABOLIC PANEL - Abnormal; Notable for the following components:      Result Value   Potassium 3.2 (*)  Glucose, Bld 101 (*)    All other components within normal limits  CBC WITH DIFFERENTIAL/PLATELET - Abnormal; Notable for the following components:   Monocytes Absolute 1.1 (*)    All other components within normal limits  PROTIME-INR    EKG None  Radiology No results found.  Procedures Procedures (including critical care time)  Medications Ordered in ED Medications  0.9 %  sodium chloride infusion (500 mLs  Intravenous New Bag/Given 09/14/18 1604)  predniSONE (DELTASONE) tablet 60 mg (has no administration in time range)  morphine 4 MG/ML injection 8 mg (8 mg Intravenous Given 09/14/18 1605)     Initial Impression / Assessment and Plan / ED Course  I have reviewed the triage vital signs and the nursing notes.  Pertinent labs & imaging results that were available during my care of the patient were reviewed by me and considered in my medical decision making (see chart for details).        Given the multiple areas of joint pain and some mild swelling I think this is a rheumatoid arthritis flare.  Unclear why he has the ecchymosis over his forearm.  He has good range of motion of his forearm and his wrist without trauma I think bony fracture is unlikely.  I offered x-ray but he declines.  Good distal pulses.  There is no uniform swelling to suggest a proximal  obstruction such as thoracic outlet syndrome.  He is feeling a little better with IV morphine here.  Will start on prednisone taper.  Follow-up with PCP and/or rheumatologist.  We discussed return precautions.  I highly doubt septic joint or multiple septic joints.  Final Clinical Impressions(s) / ED Diagnoses   Final diagnoses:  Rheumatoid arthritis flare [facility])    ED Discharge Orders         Ordered    predniSONE (DELTASONE) 20 MG tablet     09/14/18 1635           Sherwood Gambler, MD 09/14/18 1641

## 2018-09-14 NOTE — Discharge Instructions (Addendum)
If you develop fever, worsening swelling or swelling of your entire arms, trouble breathing, chest pain, redness, hot or very warm joints, intractable pain, or any other new/concerning symptoms then return to the ER for evaluation.

## 2018-09-14 NOTE — ED Triage Notes (Addendum)
Pt reports bilateral arm pain, discoloration, swelling x 2 days. Denies injury. Sent from [facility]

## 2018-09-25 ENCOUNTER — Other Ambulatory Visit: Payer: Self-pay | Admitting: Rheumatology

## 2018-09-26 DIAGNOSIS — G2581 Restless legs syndrome: Secondary | ICD-10-CM | POA: Diagnosis not present

## 2018-09-26 DIAGNOSIS — N400 Enlarged prostate without lower urinary tract symptoms: Secondary | ICD-10-CM | POA: Diagnosis not present

## 2018-09-26 DIAGNOSIS — I10 Essential (primary) hypertension: Secondary | ICD-10-CM | POA: Diagnosis not present

## 2018-09-26 DIAGNOSIS — F325 Major depressive disorder, single episode, in full remission: Secondary | ICD-10-CM | POA: Diagnosis not present

## 2018-09-26 DIAGNOSIS — M79606 Pain in leg, unspecified: Secondary | ICD-10-CM | POA: Diagnosis not present

## 2018-09-26 DIAGNOSIS — I700 Atherosclerosis of aorta: Secondary | ICD-10-CM | POA: Diagnosis not present

## 2018-09-26 DIAGNOSIS — M79603 Pain in arm, unspecified: Secondary | ICD-10-CM | POA: Diagnosis not present

## 2018-09-26 DIAGNOSIS — E782 Mixed hyperlipidemia: Secondary | ICD-10-CM | POA: Diagnosis not present

## 2018-09-26 NOTE — Telephone Encounter (Signed)
Last Visit: 07/09/2018 Next Visit: 12/04/2018 Labs: 07/09/2018 Absolute monocytes are mildly elevated. Rest of CBC is WNL. CMP WNL. PLQ Eye Exam: 03/27/17 WNL  Patient advised he is due to update PLQ eye exam. Patient states the doctor he was seeing is no longer there. Patient advised to schedule an appointment as soon as possible.  Okay to refill PLQ?

## 2018-09-26 NOTE — Telephone Encounter (Signed)
ok 

## 2018-10-21 DIAGNOSIS — K2270 Barrett's esophagus without dysplasia: Secondary | ICD-10-CM | POA: Diagnosis not present

## 2018-10-21 DIAGNOSIS — Z9889 Other specified postprocedural states: Secondary | ICD-10-CM | POA: Diagnosis not present

## 2018-11-03 ENCOUNTER — Other Ambulatory Visit: Payer: Self-pay | Admitting: Rheumatology

## 2018-11-04 NOTE — Telephone Encounter (Signed)
Last Visit: 07/09/2018 Next Visit: 12/04/2018 Labs: 09/14/2018 elevated glucose, potassium 3.2, monocytes absolute 1.1.   Okay to refill per Dr. Estanislado Pandy.

## 2018-11-15 DIAGNOSIS — H5213 Myopia, bilateral: Secondary | ICD-10-CM | POA: Diagnosis not present

## 2018-11-15 DIAGNOSIS — H353130 Nonexudative age-related macular degeneration, bilateral, stage unspecified: Secondary | ICD-10-CM | POA: Diagnosis not present

## 2018-11-15 DIAGNOSIS — H524 Presbyopia: Secondary | ICD-10-CM | POA: Diagnosis not present

## 2018-11-15 DIAGNOSIS — Z79899 Other long term (current) drug therapy: Secondary | ICD-10-CM | POA: Diagnosis not present

## 2018-11-20 NOTE — Progress Notes (Signed)
Office Visit Note  Patient: David [name]             Date of Birth: 1950/12/23           MRN: [id number]             PCP: Orpah Melter, MD Referring: Orpah Melter, MD Visit Date: 12/04/2018 Occupation: @GUAROCC @  Subjective:  Pain in both shoulder joints     History of Present Illness: David [name] is a 68 y.o. male with history of seronegative rheumatoid arthritis, osteoarthritis, DDD.  Patient is on triple therapy of methotrexate 1 mL subcutaneous injections once weekly, folic acid 2 mg a mouth daily, sulfasalazine 500 mg 2 tablets twice daily, and Plaquenil 20 mg 1 tablet twice daily.  He reports he is having pain and stiffness in both hands, worse in the morning.  He reports muscle tenderness and aching in both upper extremities.  He states the tenderness can be exquisite and cause him to yell out.  He has been having worsening nocturnal pain.  He denies any other joint pain or joint swelling.  He had a PLQ eye exam on 11/15/18.    Activities of Daily Living:  Patient reports morning stiffness for several hours.   Patient Reports nocturnal pain.  Difficulty dressing/grooming: Denies Difficulty climbing stairs: Denies Difficulty getting out of chair: Denies Difficulty using hands for taps, buttons, cutlery, and/or writing: Reports  Review of Systems  Constitutional: Positive for fatigue. Negative for night sweats.  HENT: Negative for mouth sores, mouth dryness and nose dryness.   Eyes: Negative for pain, redness, itching and dryness.  Respiratory: Negative for cough, hemoptysis, shortness of breath, wheezing and difficulty breathing.   Cardiovascular: Negative for chest pain, palpitations, hypertension, irregular heartbeat and swelling in legs/feet.  Gastrointestinal: Negative for abdominal pain, blood in stool, constipation and diarrhea.  Endocrine: Negative for increased urination.  Genitourinary: Negative for painful urination and pelvic pain.  Musculoskeletal:  Positive for arthralgias, joint pain, joint swelling and morning stiffness. Negative for myalgias, muscle weakness, muscle tenderness and myalgias.  Skin: Negative for color change, rash, hair loss, nodules/bumps, redness, skin tightness, ulcers and sensitivity to sunlight.  Allergic/Immunologic: Negative for susceptible to infections.  Neurological: Negative for dizziness, fainting, light-headedness, headaches, night sweats and weakness.  Hematological: Negative for swollen glands.  Psychiatric/Behavioral: Negative for depressed mood, confusion and sleep disturbance. The patient is not nervous/anxious.     PMFS History:  Patient Active Problem List   Diagnosis Date Noted  . SBO (small bowel obstruction) ([place]) 08/21/2017  . Trochanteric bursitis of left hip 01/18/2017  . Lateral epicondylitis, right elbow 01/18/2017  . Rheumatoid arthritis ([place]) 03/15/2016  . DJD (degenerative joint disease), cervical 03/15/2016  . DDD (degenerative disc disease), lumbar 03/15/2016  . HTN (hypertension) 03/15/2016  . Obesity 03/15/2016  . Elevated cholesterol 03/15/2016  . Neuralgia 03/15/2016  . Malignant melanoma of right side of neck ([place]) 03/15/2016  . BBB (bundle branch block) 03/15/2016  . High risk medication use 03/15/2016  . Osteoarthritis of lumbar spine 03/15/2016  . Primary osteoarthritis of both hands 03/15/2016  . Primary osteoarthritis of both knees 03/15/2016  . Mesenteric adenitis 06/29/2015    Past Medical History:  Diagnosis Date  . Arthritis   . BBB (bundle branch block) 03/15/2016  . Carpal tunnel syndrome   . DDD (degenerative disc disease), lumbar 03/15/2016  . Depression   . Dysrhythmia    had some tachycardia with steroids  . Elevated cholesterol 03/15/2016  .  GERD (gastroesophageal reflux disease)    no meds since gastric banding  . Headache(784.0)    Ocular Migraines - takes Ambien for it  . HTN (hypertension) 03/15/2016  . Hyperlipemia   . Hypertension   .  Malignant melanoma of right side of neck ([place]) 03/15/2016   30 years ago   . Neuralgia 03/15/2016  . Rheumatoid arthritis ([place]) 03/15/2016   Sero Negative.   Marland Kitchen RLS (restless legs syndrome)   . Shortness of breath    DOE  . Sleep apnea    could not use a cpap  . Snores   . Status post gastric banding   . Wears glasses    driving    Family History  Problem Relation Age of Onset  . Heart disease Mother   . Alzheimer's disease Father    Past Surgical History:  Procedure Laterality Date  . CARPAL TUNNEL RELEASE Right 09/26/2012   Procedure: CARPAL TUNNEL RELEASE;  Surgeon: Tennis Must, MD;  Location: [facility];  Service: Orthopedics;  Laterality: Right;  . CARPAL TUNNEL RELEASE Left 10/24/2012   Procedure: CARPAL TUNNEL RELEASE;  Surgeon: Tennis Must, MD;  Location: [facility];  Service: Orthopedics;  Laterality: Left;  . EXCISION MELANOMA WITH SENTINEL LYMPH NODE BIOPSY  1984   rt neck with mulpipal nodes and some muscle excised rt neck-shoulder  . KNEE ARTHROPLASTY    . knee replacement    . LAPAROSCOPIC GASTRIC BANDING  03/15/2009  . LAPAROSCOPIC GASTRIC BANDING    . MUSCLE BIOPSY  2004   rt thigh to r/o myocitis  . TONSILLECTOMY    . TOTAL KNEE ARTHROPLASTY Bilateral 12/05/2010  . TRIGGER FINGER RELEASE Right 09/26/2012   Procedure: RELEASE TRIGGER FINGER/A-1 PULLEY RING AND SMALL ;  Surgeon: Tennis Must, MD;  Location: [facility];  Service: Orthopedics;  Laterality: Right;  . TRIGGER FINGER RELEASE Left 10/24/2012   Procedure: RELEASE TRIGGER FINGER/A-1 PULLEY LEFT MIDDLE AND LEFT RING;  Surgeon: Tennis Must, MD;  Location: [facility];  Service: Orthopedics;  Laterality: Left;   Social History   Social History Narrative  . Not on file    There is no immunization history on file for this patient.   Objective: Vital Signs: BP 115/72 (BP Location: Left Arm, Patient Position: Sitting, Cuff Size: Large)   Pulse  72   Resp 15   Ht 5\' 8"  (1.727 m)   Wt 269 lb (122 kg)   BMI 40.90 kg/m    Physical Exam Vitals signs and nursing note reviewed.  Constitutional:      Appearance: He is well-developed.  HENT:     Head: Normocephalic and atraumatic.  Eyes:     Conjunctiva/sclera: Conjunctivae normal.     Pupils: Pupils are equal, round, and reactive to light.  Neck:     Musculoskeletal: Normal range of motion and neck supple.  Cardiovascular:     Rate and Rhythm: Normal rate and regular rhythm.     Heart sounds: Normal heart sounds.  Pulmonary:     Effort: Pulmonary effort is normal.     Breath sounds: Normal breath sounds.  Abdominal:     General: Bowel sounds are normal.     Palpations: Abdomen is soft.  Skin:    General: Skin is warm and dry.     Capillary Refill: Capillary refill takes less than 2 seconds.  Neurological:     Mental Status: He is alert and oriented to  person, place, and time.  Psychiatric:        Behavior: Behavior normal.      Musculoskeletal Exam: C-spine, thoracic spine, and lumbar spine good ROM.  Limited abduction and internal rotation of both shoulder joints.  Elbow joints, wrist joints, MCPs, PIPs, and DIPs good ROM with no synovitis.  PIP and DIP synovial thickening consistent with osteoarthritis.  Hip joints, ankle joints, MTPs, PIPs, and DIPs good ROM with no synovitis. Bilateral knee replacements have good ROM with no warmth or effusion.  No warmth or effusion of knee joints.  No tenderness or swelling of ankle joints.   CDAI Exam: CDAI Score: - Patient Global: -; Provider Global: - Swollen: -; Tender: - Joint Exam   No joint exam has been documented for this visit   There is currently no information documented on the homunculus. Go to the Rheumatology activity and complete the homunculus joint exam.  Investigation: No additional findings.  Imaging: No results found.  Recent Labs: Lab Results  Component Value Date   WBC 8.2 09/14/2018   HGB 14.4  09/14/2018   PLT 226 09/14/2018   NA 137 09/14/2018   K 3.2 (L) 09/14/2018   CL 101 09/14/2018   CO2 28 09/14/2018   GLUCOSE 101 (H) 09/14/2018   BUN 21 09/14/2018   CREATININE 0.93 09/14/2018   BILITOT 0.6 09/14/2018   ALKPHOS 72 09/14/2018   AST 36 09/14/2018   ALT 30 09/14/2018   PROT 6.6 09/14/2018   ALBUMIN 3.8 09/14/2018   CALCIUM 9.1 09/14/2018   GFRAA >60 09/14/2018    Speciality Comments: PLQ Eye Exam: 03/27/17 WNL @ [facility]   Procedures:  Indications: pain Details: 71 G 1.5 in needle, posterior approach  Arthrogram: No  Outcome: tolerated well, no immediate complications Procedure, treatment alternatives, risks and benefits explained, specific risks discussed. Consent was given by the patient. Immediately prior to procedure a time out was called to verify the correct patient, procedure, equipment, support staff and site/side marked as required. Patient was prepped and draped in the usual sterile fashion.   Large Joint Inj: bilateral glenohumeral on 12/04/2018 10:58 AM Indications: pain Details: 27 G 1.5 in needle, posterior approach  Arthrogram: No  Medications (Right): 1 mL lidocaine 1 %; 40 mg triamcinolone acetonide 40 MG/ML Aspirate (Right): 0 mL Medications (Left): 1 mL lidocaine 1 %; 40 mg triamcinolone acetonide 40 MG/ML Aspirate (Left): 0 mL Outcome: tolerated well, no immediate complications Procedure, treatment alternatives, risks and benefits explained, specific risks discussed. Consent was given by the patient. Immediately prior to procedure a time out was called to verify the correct patient, procedure, equipment, support staff and site/side marked as required. Patient was prepped and draped in the usual sterile fashion.     Allergies: Codeine, Dilaudid [hydromorphone hcl], Fentanyl, Nucynta [tapentadol], and Penicillins    Assessment / Plan:     Visit Diagnoses: Rheumatoid arthritis of multiple sites with negative rheumatoid factor ([place]): He  has no synovitis or tenderness on exam.  He has not had any recent rheumatoid arthritis flares.  He is clinically doing well on triple therapy including methotrexate 1 mL subcutaneous injections once weekly, Plaquenil 200 mg 1 tablet twice daily, and sulfasalazine 500 mg 2 tablets twice daily.  He has not missed any doses of these medications recently.  He has chronic pain and stiffness in bilateral hands due to underlying osteoarthritis.  He has tenderness of bilateral CMC joints.  Joint protection and muscle strengthening were discussed today.  He is been having increased pain in bilateral shoulder joints.  X-rays of both shoulders were obtained today.  He requested bilateral cortisone injections.  He tolerated the procedure well.  He will continue on his current treatment regimen.  He was advised to notify us if he develops increased joint pain or joint swelling.  He will follow-up in the office in 5 months.  High risk medication use - Methotrexate 1 mL every 7 days, folic acid 1 mg 2 tablets daily, sulfasalazine 500 mg 2 tablets twice daily, and Plaquenil 200 mg 1 tablet twice daily.  Last Plaquenil eye exam normal on 11/15/18.  Most recent CBC/CMP during hospital encounter within normal limits except for low potassium on 09/14/2018.  CBC and CMP were drawn today. Standing orders are in place.  Primary osteoarthritis of both hands - He has PIP and DIP synovial thickening consistent with osteoarthritis of bilateral hands.  He has CMC joint synovial thickening and tenderness bilaterally.  He has complete fist formation bilaterally.  No synovitis was noted.  Joint protection and muscle strengthening were discussed.  Hx of total knee replacement, bilateral - Doing well.  He has good ROM with no discomfort.  No warmth or effusion of knee joints.   DDD (degenerative disc disease), cervical - He has good ROM with some discomfort.   DDD (degenerative disc disease), lumbar - with spinal stenosis -Chronic pain    Chronic pain of both shoulders -He presents today with increased pain in bilateral shoulder joints.  He has limited abduction and internal rotation with discomfort.  X-rays of both shoulders were obtained today.  He requested bilateral cortisone injections.  He tolerated procedure well.  The procedure note was completed above.  Aftercare was discussed.  He was given a handout of shoulder exercises to perform.  He was advised to notify us if he develops new or worsening symptoms.  Plan: XR Shoulder Left, XR Shoulder Right  Other medical conditions are listed as follows:   History of melanoma  Neuralgia   History of obesity     Orders: Orders Placed This Encounter  Procedures  . Large Joint Inj  . XR Shoulder Left  . XR Shoulder Right   Meds ordered this encounter  Medications  . hydroxychloroquine (PLAQUENIL) 200 MG tablet    Sig: Take 1 tablet (200 mg total) by mouth 2 (two) times daily.    Dispense:  180 tablet    Refill:  0  . sulfaSALAzine (AZULFIDINE) 500 MG EC tablet    Sig: Take 2 tablets (1,000 mg total) by mouth 2 (two) times daily.    Dispense:  360 tablet    Refill:  0    Face-to-face time spent with patient was 30 minutes. Greater than 50% of time was spent in counseling and coordination of care.  Follow-Up Instructions: Return in about 5 months (around 05/06/2019) for Rheumatoid arthritis, DDD.   Ofilia Neas, PA-C   I examined and evaluated the patient with Hazel Sams PA.  Patient complains of increased pain and discomfort in his joints.  He had no synovitis on examination today.  He has been having discomfort in his shoulder joints.  The x-ray of the shoulder joint showed acromioclavicular arthritis otherwise unremarkable.  Per his request after different treatment options were discussed bilateral shoulder joints were injected with cortisone as described above.  He will continue current regimen for right now.  The plan of care was discussed as noted above.   Bo Merino, MD Note -  This record has been created using [employer].  Chart creation errors have been sought, but may not always  have been located. Such creation errors do not reflect on  the standard of medical care.

## 2018-12-04 ENCOUNTER — Ambulatory Visit (INDEPENDENT_AMBULATORY_CARE_PROVIDER_SITE_OTHER): Payer: Medicare Other

## 2018-12-04 ENCOUNTER — Other Ambulatory Visit: Payer: Self-pay

## 2018-12-04 ENCOUNTER — Ambulatory Visit: Payer: Self-pay

## 2018-12-04 ENCOUNTER — Encounter: Payer: Self-pay | Admitting: Rheumatology

## 2018-12-04 ENCOUNTER — Ambulatory Visit (INDEPENDENT_AMBULATORY_CARE_PROVIDER_SITE_OTHER): Payer: Medicare Other | Admitting: Rheumatology

## 2018-12-04 VITALS — BP 115/72 | HR 72 | Resp 15 | Ht 68.0 in | Wt 269.0 lb

## 2018-12-04 DIAGNOSIS — Z96653 Presence of artificial knee joint, bilateral: Secondary | ICD-10-CM | POA: Diagnosis not present

## 2018-12-04 DIAGNOSIS — M0609 Rheumatoid arthritis without rheumatoid factor, multiple sites: Secondary | ICD-10-CM

## 2018-12-04 DIAGNOSIS — M19041 Primary osteoarthritis, right hand: Secondary | ICD-10-CM

## 2018-12-04 DIAGNOSIS — G8929 Other chronic pain: Secondary | ICD-10-CM

## 2018-12-04 DIAGNOSIS — Z85820 Personal history of malignant melanoma of skin: Secondary | ICD-10-CM | POA: Diagnosis not present

## 2018-12-04 DIAGNOSIS — M19042 Primary osteoarthritis, left hand: Secondary | ICD-10-CM | POA: Diagnosis not present

## 2018-12-04 DIAGNOSIS — M792 Neuralgia and neuritis, unspecified: Secondary | ICD-10-CM | POA: Diagnosis not present

## 2018-12-04 DIAGNOSIS — M5136 Other intervertebral disc degeneration, lumbar region: Secondary | ICD-10-CM

## 2018-12-04 DIAGNOSIS — M25511 Pain in right shoulder: Secondary | ICD-10-CM | POA: Diagnosis not present

## 2018-12-04 DIAGNOSIS — Z79899 Other long term (current) drug therapy: Secondary | ICD-10-CM

## 2018-12-04 DIAGNOSIS — M25512 Pain in left shoulder: Secondary | ICD-10-CM

## 2018-12-04 DIAGNOSIS — M5030 Other cervical disc degeneration, unspecified cervical region: Secondary | ICD-10-CM | POA: Diagnosis not present

## 2018-12-04 DIAGNOSIS — Z8639 Personal history of other endocrine, nutritional and metabolic disease: Secondary | ICD-10-CM

## 2018-12-04 MED ORDER — SULFASALAZINE 500 MG PO TBEC: 1000.0000 mg | DELAYED_RELEASE_TABLET | Freq: Two times a day (BID) | ORAL | 0 refills | Status: DC

## 2018-12-04 MED ORDER — TRIAMCINOLONE ACETONIDE 40 MG/ML IJ SUSP
40.0000 mg | INTRAMUSCULAR | Status: AC | PRN
  Administered 2018-12-04: 40 mg via INTRA_ARTICULAR

## 2018-12-04 MED ORDER — LIDOCAINE HCL 1 % IJ SOLN
1.0000 mL | INTRAMUSCULAR | Status: AC | PRN
  Administered 2018-12-04: 1 mL

## 2018-12-04 MED ORDER — HYDROXYCHLOROQUINE SULFATE 200 MG PO TABS: 200.0000 mg | ORAL_TABLET | Freq: Two times a day (BID) | ORAL | 0 refills | Status: DC

## 2018-12-04 NOTE — Addendum Note (Signed)
Addended by: Ofilia Neas on: 12/04/2018 11:20 AM   Modules accepted: Orders

## 2018-12-04 NOTE — Patient Instructions (Signed)
Shoulder Exercises Ask your health care provider which exercises are safe for you. Do exercises exactly as told by your health care provider and adjust them as directed. It is normal to feel mild stretching, pulling, tightness, or discomfort as you do these exercises. Stop right away if you feel sudden pain or your pain gets worse. Do not begin these exercises until told by your health care provider. Stretching exercises External rotation and abduction This exercise is sometimes called corner stretch. This exercise rotates your arm outward (external rotation) and moves your arm out from your body (abduction). 1. Stand in a doorway with one of your feet slightly in front of the other. This is called a staggered stance. If you cannot reach your forearms to the door frame, stand facing a corner of a room. 2. Choose one of the following positions as told by your health care provider: ? Place your hands and forearms on the door frame above your head. ? Place your hands and forearms on the door frame at the height of your head. ? Place your hands on the door frame at the height of your elbows. 3. Slowly move your weight onto your front foot until you feel a stretch across your chest and in the front of your shoulders. Keep your head and chest upright and keep your abdominal muscles tight. 4. Hold for __________ seconds. 5. To release the stretch, shift your weight to your back foot. Repeat __________ times. Complete this exercise __________ times a day. Extension, standing 1. Stand and hold a broomstick, a cane, or a similar object behind your back. ? Your hands should be a little wider than shoulder width apart. ? Your palms should face away from your back. 2. Keeping your elbows straight and your shoulder muscles relaxed, move the stick away from your body until you feel a stretch in your shoulders (extension). ? Avoid shrugging your shoulders while you move the stick. Keep your shoulder blades tucked  down toward the middle of your back. 3. Hold for __________ seconds. 4. Slowly return to the starting position. Repeat __________ times. Complete this exercise __________ times a day. Range-of-motion exercises Pendulum  1. Stand near a wall or a surface that you can hold onto for balance. 2. Bend at the waist and let your left / right arm hang straight down. Use your other arm to support you. Keep your back straight and do not lock your knees. 3. Relax your left / right arm and shoulder muscles, and move your hips and your trunk so your left / right arm swings freely. Your arm should swing because of the motion of your body, not because you are using your arm or shoulder muscles. 4. Keep moving your hips and trunk so your arm swings in the following directions, as told by your health care provider: ? Side to side. ? Forward and backward. ? In clockwise and counterclockwise circles. 5. Continue each motion for __________ seconds, or for as long as told by your health care provider. 6. Slowly return to the starting position. Repeat __________ times. Complete this exercise __________ times a day. Shoulder flexion, standing  1. Stand and hold a broomstick, a cane, or a similar object. Place your hands a little more than shoulder width apart on the object. Your left / right hand should be palm up, and your other hand should be palm down. 2. Keep your elbow straight and your shoulder muscles relaxed. Push the stick up with your healthy arm to   raise your left / right arm in front of your body, and then over your head until you feel a stretch in your shoulder (flexion). ? Avoid shrugging your shoulder while you raise your arm. Keep your shoulder blade tucked down toward the middle of your back. 3. Hold for __________ seconds. 4. Slowly return to the starting position. Repeat __________ times. Complete this exercise __________ times a day. Shoulder abduction, standing 1. Stand and hold a broomstick,  a cane, or a similar object. Place your hands a little more than shoulder width apart on the object. Your left / right hand should be palm up, and your other hand should be palm down. 2. Keep your elbow straight and your shoulder muscles relaxed. Push the object across your body toward your left / right side. Raise your left / right arm to the side of your body (abduction) until you feel a stretch in your shoulder. ? Do not raise your arm above shoulder height unless your health care provider tells you to do that. ? If directed, raise your arm over your head. ? Avoid shrugging your shoulder while you raise your arm. Keep your shoulder blade tucked down toward the middle of your back. 3. Hold for __________ seconds. 4. Slowly return to the starting position. Repeat __________ times. Complete this exercise __________ times a day. Internal rotation  1. Place your left / right hand behind your back, palm up. 2. Use your other hand to dangle an exercise band, a towel, or a similar object over your shoulder. Grasp the band with your left / right hand so you are holding on to both ends. 3. Gently pull up on the band until you feel a stretch in the front of your left / right shoulder. The movement of your arm toward the center of your body is called internal rotation. ? Avoid shrugging your shoulder while you raise your arm. Keep your shoulder blade tucked down toward the middle of your back. 4. Hold for __________ seconds. 5. Release the stretch by letting go of the band and lowering your hands. Repeat __________ times. Complete this exercise __________ times a day. Strengthening exercises External rotation  1. Sit in a stable chair without armrests. 2. Secure an exercise band to a stable object at elbow height on your left / right side. 3. Place a soft object, such as a folded towel or a small pillow, between your left / right upper arm and your body to move your elbow about 4 inches (10 cm) away  from your side. 4. Hold the end of the exercise band so it is tight and there is no slack. 5. Keeping your elbow pressed against the soft object, slowly move your forearm out, away from your abdomen (external rotation). Keep your body steady so only your forearm moves. 6. Hold for __________ seconds. 7. Slowly return to the starting position. Repeat __________ times. Complete this exercise __________ times a day. Shoulder abduction  1. Sit in a stable chair without armrests, or stand up. 2. Hold a __________ weight in your left / right hand, or hold an exercise band with both hands. 3. Start with your arms straight down and your left / right palm facing in, toward your body. 4. Slowly lift your left / right hand out to your side (abduction). Do not lift your hand above shoulder height unless your health care provider tells you that this is safe. ? Keep your arms straight. ? Avoid shrugging your shoulder while you   do this movement. Keep your shoulder blade tucked down toward the middle of your back. 5. Hold for __________ seconds. 6. Slowly lower your arm, and return to the starting position. Repeat __________ times. Complete this exercise __________ times a day. Shoulder extension 1. Sit in a stable chair without armrests, or stand up. 2. Secure an exercise band to a stable object in front of you so it is at shoulder height. 3. Hold one end of the exercise band in each hand. Your palms should face each other. 4. Straighten your elbows and lift your hands up to shoulder height. 5. Step back, away from the secured end of the exercise band, until the band is tight and there is no slack. 6. Squeeze your shoulder blades together as you pull your hands down to the sides of your thighs (extension). Stop when your hands are straight down by your sides. Do not let your hands go behind your body. 7. Hold for __________ seconds. 8. Slowly return to the starting position. Repeat __________ times.  Complete this exercise __________ times a day. Shoulder row 1. Sit in a stable chair without armrests, or stand up. 2. Secure an exercise band to a stable object in front of you so it is at waist height. 3. Hold one end of the exercise band in each hand. Position your palms so that your thumbs are facing the ceiling (neutral position). 4. Bend each of your elbows to a 90-degree angle (right angle) and keep your upper arms at your sides. 5. Step back until the band is tight and there is no slack. 6. Slowly pull your elbows back behind you. 7. Hold for __________ seconds. 8. Slowly return to the starting position. Repeat __________ times. Complete this exercise __________ times a day. Shoulder press-ups  1. Sit in a stable chair that has armrests. Sit upright, with your feet flat on the floor. 2. Put your hands on the armrests so your elbows are bent and your fingers are pointing forward. Your hands should be about even with the sides of your body. 3. Push down on the armrests and use your arms to lift yourself off the chair. Straighten your elbows and lift yourself up as much as you comfortably can. ? Move your shoulder blades down, and avoid letting your shoulders move up toward your ears. ? Keep your feet on the ground. As you get stronger, your feet should support less of your body weight as you lift yourself up. 4. Hold for __________ seconds. 5. Slowly lower yourself back into the chair. Repeat __________ times. Complete this exercise __________ times a day. Wall push-ups  1. Stand so you are facing a stable wall. Your feet should be about one arm-length away from the wall. 2. Lean forward and place your palms on the wall at shoulder height. 3. Keep your feet flat on the floor as you bend your elbows and lean forward toward the wall. 4. Hold for __________ seconds. 5. Straighten your elbows to push yourself back to the starting position. Repeat __________ times. Complete this exercise  __________ times a day. This information is not intended to replace advice given to you by your health care provider. Make sure you discuss any questions you have with your health care provider. Document Released: 03/22/2005 Document Revised: 08/30/2018 Document Reviewed: 06/07/2018 Elsevier Patient Education  2020 [employer]  

## 2018-12-05 LAB — CBC WITH DIFFERENTIAL/PLATELET
Absolute Monocytes: 1006 cells/uL — ABNORMAL HIGH (ref 200–950)
Basophils Absolute: 62 cells/uL (ref 0–200)
Basophils Relative: 0.8 %
Eosinophils Absolute: 203 cells/uL (ref 15–500)
Eosinophils Relative: 2.6 %
HCT: 46.9 % (ref 38.5–50.0)
Hemoglobin: 15.6 g/dL (ref 13.2–17.1)
Lymphs Abs: 1989 cells/uL (ref 850–3900)
MCH: 31.3 pg (ref 27.0–33.0)
MCHC: 33.3 g/dL (ref 32.0–36.0)
MCV: 94.2 fL (ref 80.0–100.0)
MPV: 9.7 fL (ref 7.5–12.5)
Monocytes Relative: 12.9 %
Neutro Abs: 4540 cells/uL (ref 1500–7800)
Neutrophils Relative %: 58.2 %
Platelets: 275 10*3/uL (ref 140–400)
RBC: 4.98 10*6/uL (ref 4.20–5.80)
RDW: 14.1 % (ref 11.0–15.0)
Total Lymphocyte: 25.5 %
WBC: 7.8 10*3/uL (ref 3.8–10.8)

## 2018-12-05 LAB — COMPLETE METABOLIC PANEL WITH GFR
AG Ratio: 1.6 (calc) (ref 1.0–2.5)
ALT: 28 U/L (ref 9–46)
AST: 34 U/L (ref 10–35)
Albumin: 4.1 g/dL (ref 3.6–5.1)
Alkaline phosphatase (APISO): 61 U/L (ref 35–144)
BUN: 14 mg/dL (ref 7–25)
CO2: 32 mmol/L (ref 20–32)
Calcium: 10.2 mg/dL (ref 8.6–10.3)
Chloride: 102 mmol/L (ref 98–110)
Creat: 1.03 mg/dL (ref 0.70–1.25)
GFR, Est African American: 87 mL/min/{1.73_m2} (ref >=60)
GFR, Est Non African American: 75 mL/min/{1.73_m2} (ref >=60)
Globulin: 2.5 g/dL (calc) (ref 1.9–3.7)
Glucose, Bld: 102 mg/dL — ABNORMAL HIGH (ref 65–99)
Potassium: 5.3 mmol/L (ref 3.5–5.3)
Sodium: 139 mmol/L (ref 135–146)
Total Bilirubin: 0.5 mg/dL (ref 0.2–1.2)
Total Protein: 6.6 g/dL (ref 6.1–8.1)

## 2018-12-05 NOTE — Progress Notes (Signed)
Labs are WNL.

## 2019-01-06 ENCOUNTER — Other Ambulatory Visit: Payer: Self-pay | Admitting: Rheumatology

## 2019-01-06 DIAGNOSIS — Z79899 Other long term (current) drug therapy: Secondary | ICD-10-CM

## 2019-01-22 ENCOUNTER — Other Ambulatory Visit: Payer: Self-pay | Admitting: Rheumatology

## 2019-01-22 NOTE — Telephone Encounter (Signed)
Last Visit: 12/14/18 Next Visit: 05/12/19 Labs: 12/04/18 WNL  Okay to refill per Dr. Estanislado Pandy

## 2019-01-30 ENCOUNTER — Telehealth: Payer: Self-pay | Admitting: Pharmacy Technician

## 2019-01-30 NOTE — Telephone Encounter (Signed)
Received notification from [facility] regarding a prior authorization for Methotrexate vial. Authorization has been APPROVED from 01/30/19 to 05/22/19.   Approved quantity: 12 units per 84 day(s). You may fill up to a 90 day supply except for those on Specialty Tier 5, which can be filled up to a 30 day supply.  Called [name] and advised of approval.  Authorization # [id number] Phone # [phone]  10:43 AM Beatriz Chancellor, CPhT

## 2019-03-13 ENCOUNTER — Other Ambulatory Visit: Payer: Self-pay | Admitting: Physician Assistant

## 2019-03-13 DIAGNOSIS — Z79899 Other long term (current) drug therapy: Secondary | ICD-10-CM

## 2019-03-14 NOTE — Telephone Encounter (Addendum)
Last Visit: 12/14/18 Next Visit: 05/12/19 Labs: 12/04/18 WNL  Patient's wife advised patient is due to update labs. She will have patient come to office Monday to update.

## 2019-03-18 ENCOUNTER — Other Ambulatory Visit: Payer: Self-pay

## 2019-03-18 DIAGNOSIS — Z79899 Other long term (current) drug therapy: Secondary | ICD-10-CM

## 2019-03-19 LAB — CBC WITH DIFFERENTIAL/PLATELET
Absolute Monocytes: 1234 cells/uL — ABNORMAL HIGH (ref 200–950)
Basophils Absolute: 58 cells/uL (ref 0–200)
Basophils Relative: 0.8 %
Eosinophils Absolute: 168 cells/uL (ref 15–500)
Eosinophils Relative: 2.3 %
HCT: 45.2 % (ref 38.5–50.0)
Hemoglobin: 15.1 g/dL (ref 13.2–17.1)
Lymphs Abs: 2227 cells/uL (ref 850–3900)
MCH: 31.5 pg (ref 27.0–33.0)
MCHC: 33.4 g/dL (ref 32.0–36.0)
MCV: 94.4 fL (ref 80.0–100.0)
MPV: 9.7 fL (ref 7.5–12.5)
Monocytes Relative: 16.9 %
Neutro Abs: 3614 cells/uL (ref 1500–7800)
Neutrophils Relative %: 49.5 %
Platelets: 282 10*3/uL (ref 140–400)
RBC: 4.79 10*6/uL (ref 4.20–5.80)
RDW: 13.9 % (ref 11.0–15.0)
Total Lymphocyte: 30.5 %
WBC: 7.3 10*3/uL (ref 3.8–10.8)

## 2019-03-19 LAB — COMPLETE METABOLIC PANEL WITH GFR
AG Ratio: 1.9 (calc) (ref 1.0–2.5)
ALT: 19 U/L (ref 9–46)
AST: 28 U/L (ref 10–35)
Albumin: 4.1 g/dL (ref 3.6–5.1)
Alkaline phosphatase (APISO): 64 U/L (ref 35–144)
BUN: 20 mg/dL (ref 7–25)
CO2: 25 mmol/L (ref 20–32)
Calcium: 9.6 mg/dL (ref 8.6–10.3)
Chloride: 101 mmol/L (ref 98–110)
Creat: 0.97 mg/dL (ref 0.70–1.25)
GFR, Est African American: 93 mL/min/{1.73_m2} (ref >=60)
GFR, Est Non African American: 80 mL/min/{1.73_m2} (ref >=60)
Globulin: 2.2 g/dL (calc) (ref 1.9–3.7)
Glucose, Bld: 92 mg/dL (ref 65–99)
Potassium: 4.6 mmol/L (ref 3.5–5.3)
Sodium: 136 mmol/L (ref 135–146)
Total Bilirubin: 0.4 mg/dL (ref 0.2–1.2)
Total Protein: 6.3 g/dL (ref 6.1–8.1)

## 2019-03-19 NOTE — Telephone Encounter (Signed)
Patient updated labs 03/18/19   Okay to refill per Dr. Estanislado Pandy

## 2019-03-24 ENCOUNTER — Other Ambulatory Visit: Payer: Self-pay | Admitting: Physician Assistant

## 2019-03-24 DIAGNOSIS — Z79899 Other long term (current) drug therapy: Secondary | ICD-10-CM

## 2019-03-24 NOTE — Telephone Encounter (Signed)
Last Visit: 12/14/18 Next Visit: 05/12/19 Labs: 03/18/19 CMP WNL. Absolute monocytes are elevated but stable. Rest of CBC WNL. Plaquenil eye exam normal on 11/15/18  Okay to refill per Dr. Estanislado Pandy

## 2019-04-02 DIAGNOSIS — M792 Neuralgia and neuritis, unspecified: Secondary | ICD-10-CM | POA: Diagnosis not present

## 2019-04-02 DIAGNOSIS — I10 Essential (primary) hypertension: Secondary | ICD-10-CM | POA: Diagnosis not present

## 2019-04-02 DIAGNOSIS — W19XXXA Unspecified fall, initial encounter: Secondary | ICD-10-CM | POA: Diagnosis not present

## 2019-04-02 DIAGNOSIS — M1990 Unspecified osteoarthritis, unspecified site: Secondary | ICD-10-CM | POA: Diagnosis not present

## 2019-04-02 DIAGNOSIS — Z23 Encounter for immunization: Secondary | ICD-10-CM | POA: Diagnosis not present

## 2019-04-02 DIAGNOSIS — Z0000 Encounter for general adult medical examination without abnormal findings: Secondary | ICD-10-CM | POA: Diagnosis not present

## 2019-04-02 DIAGNOSIS — E782 Mixed hyperlipidemia: Secondary | ICD-10-CM | POA: Diagnosis not present

## 2019-04-02 DIAGNOSIS — I700 Atherosclerosis of aorta: Secondary | ICD-10-CM | POA: Diagnosis not present

## 2019-04-02 DIAGNOSIS — N400 Enlarged prostate without lower urinary tract symptoms: Secondary | ICD-10-CM | POA: Diagnosis not present

## 2019-04-02 DIAGNOSIS — F325 Major depressive disorder, single episode, in full remission: Secondary | ICD-10-CM | POA: Diagnosis not present

## 2019-04-02 DIAGNOSIS — G2581 Restless legs syndrome: Secondary | ICD-10-CM | POA: Diagnosis not present

## 2019-05-07 ENCOUNTER — Other Ambulatory Visit: Payer: Self-pay

## 2019-05-07 ENCOUNTER — Other Ambulatory Visit: Payer: Self-pay | Admitting: *Deleted

## 2019-05-07 ENCOUNTER — Telehealth: Payer: Self-pay | Admitting: Rheumatology

## 2019-05-07 ENCOUNTER — Encounter: Payer: Self-pay | Admitting: Rheumatology

## 2019-05-07 ENCOUNTER — Telehealth (INDEPENDENT_AMBULATORY_CARE_PROVIDER_SITE_OTHER): Payer: Medicare Other | Admitting: Rheumatology

## 2019-05-07 DIAGNOSIS — Z96653 Presence of artificial knee joint, bilateral: Secondary | ICD-10-CM

## 2019-05-07 DIAGNOSIS — M0609 Rheumatoid arthritis without rheumatoid factor, multiple sites: Secondary | ICD-10-CM

## 2019-05-07 DIAGNOSIS — Z79899 Other long term (current) drug therapy: Secondary | ICD-10-CM | POA: Diagnosis not present

## 2019-05-07 DIAGNOSIS — M19041 Primary osteoarthritis, right hand: Secondary | ICD-10-CM | POA: Diagnosis not present

## 2019-05-07 DIAGNOSIS — M5030 Other cervical disc degeneration, unspecified cervical region: Secondary | ICD-10-CM

## 2019-05-07 DIAGNOSIS — M792 Neuralgia and neuritis, unspecified: Secondary | ICD-10-CM

## 2019-05-07 DIAGNOSIS — M19042 Primary osteoarthritis, left hand: Secondary | ICD-10-CM

## 2019-05-07 DIAGNOSIS — Z85820 Personal history of malignant melanoma of skin: Secondary | ICD-10-CM

## 2019-05-07 DIAGNOSIS — M5136 Other intervertebral disc degeneration, lumbar region: Secondary | ICD-10-CM

## 2019-05-07 MED ORDER — PREDNISONE 5 MG PO TABS: ORAL_TABLET | ORAL | 0 refills | Status: DC

## 2019-05-07 NOTE — Progress Notes (Signed)
Virtual Visit via Telephone Note  I connected with David [name] on 05/07/19 at  1:00 PM EST by telephone and verified that I am speaking with the correct person using two identifiers.  Location: Patient: Home  Provider: Clinic  This service was conducted via virtual visit.    The patient was located at home. I was located in my office.  Consent was obtained prior to the virtual visit and is aware of possible charges through their insurance for this visit.  The patient is an established patient.  Dr. Estanislado Pandy, MD conducted the virtual visit and Hazel Sams, PA-C acted as scribe during the service.  Office staff helped with scheduling follow up visits after the service was conducted.    I discussed the limitations, risks, security and privacy concerns of performing an evaluation and management service by telephone and the availability of in person appointments. I also discussed with the patient that there may be a patient responsible charge related to this service. The patient expressed understanding and agreed to proceed.  CC: Pain in both arms History of Present Illness: Patient is a 68 year old male with a past medical history of seronegative rheumatoid arthritis, osteoarthritis, and DDD. He is currently on MTX 1 ml sq once weekly, sulfasalazine 500 mg 2 tablets BID, and PLQ  200 mg 1 tablet twice daily. He has been experiencing increased pain in bilateral upper extremities for the past 2 weeks, which has progressively been worsening. He had bilateral shoulder joint cortisone injections on 12/04/18, which provided significant relief for about 4 weeks.  He is having severe pain today and having difficulty with ADLs.  He is having an aching sensation in both forearms down to his fingers.  He is having difficulty making a complete fist.  He is having painful ROM and tenderness of both wrist joints.  He is having mild swelling in both wrist joints and both hands.   Review of Systems  Constitutional:  Positive for malaise/fatigue. Negative for fever.  HENT:       +Dry mouth  Eyes: Negative for photophobia, pain, discharge and redness.  Respiratory: Negative for cough, shortness of breath and wheezing.   Cardiovascular: Negative for chest pain and palpitations.  Gastrointestinal: Negative for blood in stool, constipation and diarrhea.  Genitourinary: Negative for dysuria.  Musculoskeletal: Positive for joint pain. Negative for back pain, myalgias and neck pain.       +Joint swelling +Joint stiffness   Skin: Negative for rash.  Neurological: Negative for dizziness and headaches.  Psychiatric/Behavioral: Negative for depression. The patient is not nervous/anxious and does not have insomnia.       Observations/Objective: Physical Exam  Constitutional: He is oriented to person, place, and time.  Neurological: He is alert and oriented to person, place, and time.  Psychiatric: Mood, memory, affect and judgment normal.   Patient reports morning stiffness for 3 hours.   Patient reports nocturnal pain.  Difficulty dressing/grooming: Reports Difficulty climbing stairs: Denies Difficulty getting out of chair: Denies Difficulty using hands for taps, buttons, cutlery, and/or writing: Reports   Assessment and Plan: Visit Diagnoses: Rheumatoid arthritis of multiple sites with negative rheumatoid factor [facility]): He is having tenderness and inflammation in bilateral shoulder joints, elbow joints, wrist joints, and both hands, which started about 2 weeks ago.  His pain is severe in both wrist joints and both hands today.  He is having difficulty with ADLs.  He has noticed limited ROM of both wrist joints and is unable to  make a complete fist at this time.  He is currently on triple therapy including Methotrexate 1 ml sq once weekly, folic acid 2 mg po daily, sulfasalazine 500 mg 2 tablets BID, and Plaquenil 200 mg 1 tablet BID.  He has not missed any doses recently.  His treatment options are limited  due to his history of melanoma.  We discussed switching him SSZ to [place].  Indications, contraindications, and potential side effects of Arava were discussed.  He does not want to make any changes at this time. He will review information about Arava on his own and let us know if he would like to switch to [place].  He will start taking prednisone 20 mg tapering by 5 mg every week.  He was advised to notify us if his joint and inflammation returns after the prednisone taper.  He will follow up in 2-3 months.  High risk medication use - Methotrexate 1 mL every 7 days, folic acid 1 mg 2 tablets daily, sulfasalazine 500 mg 2 tablets twice daily, and Plaquenil 200 mg 1 tablet twice daily.  Last Plaquenil eye exam normal on 11/15/18.  CBC and CMP were drawn on 03/18/19.  He is due to update lab work in January and every 3 months.   Primary osteoarthritis of both hands - He is having severe pain and inflammation in both hands and wrist joints.  He is having difficulty making a complete fist and cannot write today due to the discomfort.  We will send in a prednisone taper today.  Hx of total knee replacement, bilateral - Doing well.  He has no joint pain or inflammation this time.  DDD (degenerative disc disease), cervical - He is not having any increased neck pain or stiffness at this time.  DDD (degenerative disc disease), lumbar - with spinal stenosis -Chronic pain.  He is not having any new or worsening symptoms.   Chronic pain of both shoulders -He has chronic pain in both shoulder joints. He had cortisone injections in both shoulder joints on 12/04/18, which resolved his discomfort for 4 weeks.  His discomfort is persistent and is limiting his activities.    Other medical conditions are listed as follows:   History of melanoma  Neuralgia   History of obesity   Follow Up Instructions: He will follow up in 2-3 months.    I discussed the assessment and treatment plan with the patient. The  patient was provided an opportunity to ask questions and all were answered. The patient agreed with the plan and demonstrated an understanding of the instructions.   The patient was advised to call back or seek an in-person evaluation if the symptoms worsen or if the condition fails to improve as anticipated.  I provided 25 minutes of non-face-to-face time during this encounter.   Bo Merino, MD   Scribed by-  Hazel Sams, PA-C

## 2019-05-07 NOTE — Telephone Encounter (Signed)
Faxed, I called patient.

## 2019-05-07 NOTE — Telephone Encounter (Signed)
Patient called checking on the status of his prescription of Prednisone.  Patient states the prescription needs to be sent to [facility] on [address] in [place].

## 2019-05-08 ENCOUNTER — Telehealth: Payer: Medicare Other | Admitting: Rheumatology

## 2019-05-12 ENCOUNTER — Ambulatory Visit: Payer: Medicare Other | Admitting: Rheumatology

## 2019-06-10 DIAGNOSIS — Z23 Encounter for immunization: Secondary | ICD-10-CM | POA: Diagnosis not present

## 2019-06-16 ENCOUNTER — Other Ambulatory Visit: Payer: Self-pay | Admitting: Rheumatology

## 2019-06-16 DIAGNOSIS — Z79899 Other long term (current) drug therapy: Secondary | ICD-10-CM

## 2019-06-16 NOTE — Telephone Encounter (Signed)
Last Visit: 05/07/19 Next Visit: 08/06/19 Labs: 03/18/19 CMP WNL. Absolute monocytes are elevated but stable. Rest of CBC WNL.  Okay to refill per Dr. Estanislado Pandy

## 2019-06-21 ENCOUNTER — Other Ambulatory Visit: Payer: Self-pay | Admitting: Rheumatology

## 2019-06-21 DIAGNOSIS — Z79899 Other long term (current) drug therapy: Secondary | ICD-10-CM

## 2019-06-23 NOTE — Telephone Encounter (Signed)
Last Visit: 05/07/19 Next Visit: 08/06/19 Labs: 03/18/19 CMP WNL. Absolute monocytes are elevated but stable. Rest of CBC WNL. PLQ Eye Exam: 11/15/18 WNL   Okay to refill per Dr. Estanislado Pandy

## 2019-06-29 ENCOUNTER — Ambulatory Visit: Payer: Medicare Other

## 2019-07-08 DIAGNOSIS — Z23 Encounter for immunization: Secondary | ICD-10-CM | POA: Diagnosis not present

## 2019-07-16 ENCOUNTER — Ambulatory Visit: Payer: Medicare Other

## 2019-07-30 NOTE — Progress Notes (Signed)
Office Visit Note  Patient: David [name]             Date of Birth: 1950-07-11           MRN: [id number]             PCP: Orpah Melter, MD Referring: Orpah Melter, MD Visit Date: 08/06/2019 Occupation: @GUAROCC @  Subjective:  Severe pain in bilateral upper extremities  History of Present Illness: JEANPHILIPPE [name] is a 69 y.o. male with history of seronegative rheumatoid arthritis and osteoarthritis. He is on MTX 1.0 ml sq once weekly, folic acid 2 mg po daily, PLQ 200 mg 1 tablet BID, and SSZ 500 mg 2 tablets twice daily.  He denies any recent rheumatoid arthritis flares.  He states that over the past 2 months he has been experiencing sharp pains intermittently in bilateral lower extremities.  He states that the symptoms have progressively been getting worse for the past 2 weeks.  He states that the past 2 weeks have been miserable.  He describes the pain has an ice pick stabbing into his arms.  The pain is very fleeting and nothing seems to alleviate or aggravate the pain.  He states the pain is so severe that it causes an involuntary jerking of his muscles.  He has been having frequent muscle spasms despite taking robaxin 500 mg every 6 hours. He has been having severe nocturnal pain causing interrupted sleep at night.  He states that his fatigue and drowsiness have been so significant that he has fallen asleep standing up and has fallen and hit his head on the floor.  He denies any joint pain or joint swelling and states that the pain is in his muscles.  He denies any muscle weakness.  He was evaluated by Dr. Ninfa Linden on 08/04/2019 who prescribed Robaxin, Medrol dosepak, and hydrocodone for pain relief.  He states the discomfort and frequency of the pains has improved slightly.  Activities of Daily Living:  Patient reports morning stiffness for 4  hours.   Patient Reports nocturnal pain.  Difficulty dressing/grooming: Reports Difficulty climbing stairs: Denies Difficulty getting out of  chair: Denies Difficulty using hands for taps, buttons, cutlery, and/or writing: Reports  Review of Systems  Constitutional: Positive for fatigue. Negative for night sweats.  HENT: Positive for mouth dryness. Negative for mouth sores and nose dryness.   Eyes: Negative for redness and dryness.  Respiratory: Negative for cough, hemoptysis, shortness of breath and difficulty breathing.   Cardiovascular: Negative for chest pain, palpitations, hypertension, irregular heartbeat and swelling in legs/feet.  Gastrointestinal: Negative for blood in stool, constipation and diarrhea.  Endocrine: Negative for increased urination.  Genitourinary: Negative for painful urination.  Musculoskeletal: Positive for arthralgias, joint pain, morning stiffness and muscle tenderness. Negative for joint swelling, myalgias, muscle weakness and myalgias.  Skin: Negative for color change, rash, hair loss, nodules/bumps, skin tightness, ulcers and sensitivity to sunlight.  Allergic/Immunologic: Negative for susceptible to infections.  Neurological: Negative for dizziness, fainting, numbness, memory loss, night sweats and weakness.  Hematological: Negative for bruising/bleeding tendency and swollen glands.  Psychiatric/Behavioral: Positive for sleep disturbance. Negative for depressed mood. The patient is not nervous/anxious.     PMFS History:  Patient Active Problem List   Diagnosis Date Noted  . SBO (small bowel obstruction) ([place]) 08/21/2017  . Trochanteric bursitis of left hip 01/18/2017  . Lateral epicondylitis, right elbow 01/18/2017  . Rheumatoid arthritis ([place]) 03/15/2016  . DJD (degenerative joint disease), cervical 03/15/2016  . DDD (  degenerative disc disease), lumbar 03/15/2016  . HTN (hypertension) 03/15/2016  . Obesity 03/15/2016  . Elevated cholesterol 03/15/2016  . Neuralgia 03/15/2016  . Malignant melanoma of right side of neck ([place]) 03/15/2016  . BBB (bundle branch block) 03/15/2016  . High risk  medication use 03/15/2016  . Osteoarthritis of lumbar spine 03/15/2016  . Primary osteoarthritis of both hands 03/15/2016  . Primary osteoarthritis of both knees 03/15/2016  . Mesenteric adenitis 06/29/2015    Past Medical History:  Diagnosis Date  . Arthritis   . BBB (bundle branch block) 03/15/2016  . Carpal tunnel syndrome   . DDD (degenerative disc disease), lumbar 03/15/2016  . Depression   . Dysrhythmia    had some tachycardia with steroids  . Elevated cholesterol 03/15/2016  . GERD (gastroesophageal reflux disease)    no meds since gastric banding  . Headache(784.0)    Ocular Migraines - takes Ambien for it  . HTN (hypertension) 03/15/2016  . Hyperlipemia   . Hypertension   . Malignant melanoma of right side of neck ([place]) 03/15/2016   30 years ago   . Neuralgia 03/15/2016  . Rheumatoid arthritis ([place]) 03/15/2016   Sero Negative.   Marland Kitchen RLS (restless legs syndrome)   . Shortness of breath    DOE  . Sleep apnea    could not use a cpap  . Snores   . Status post gastric banding   . Wears glasses    driving    Family History  Problem Relation Age of Onset  . Heart disease Mother   . Alzheimer's disease Father    Past Surgical History:  Procedure Laterality Date  . CARPAL TUNNEL RELEASE Right 09/26/2012   Procedure: CARPAL TUNNEL RELEASE;  Surgeon: Tennis Must, MD;  Location: [place];  Service: Orthopedics;  Laterality: Right;  . CARPAL TUNNEL RELEASE Left 10/24/2012   Procedure: CARPAL TUNNEL RELEASE;  Surgeon: Tennis Must, MD;  Location: [place];  Service: Orthopedics;  Laterality: Left;  . EXCISION MELANOMA WITH SENTINEL LYMPH NODE BIOPSY  1984   rt neck with mulpipal nodes and some muscle excised rt neck-shoulder  . KNEE ARTHROPLASTY    . knee replacement    . LAPAROSCOPIC GASTRIC BANDING  03/15/2009  . LAPAROSCOPIC GASTRIC BANDING    . MUSCLE BIOPSY  2004   rt thigh to r/o myocitis  . TONSILLECTOMY    . TOTAL KNEE  ARTHROPLASTY Bilateral 12/05/2010  . TRIGGER FINGER RELEASE Right 09/26/2012   Procedure: RELEASE TRIGGER FINGER/A-1 PULLEY RING AND SMALL ;  Surgeon: Tennis Must, MD;  Location: [place];  Service: Orthopedics;  Laterality: Right;  . TRIGGER FINGER RELEASE Left 10/24/2012   Procedure: RELEASE TRIGGER FINGER/A-1 PULLEY LEFT MIDDLE AND LEFT RING;  Surgeon: Tennis Must, MD;  Location: [place];  Service: Orthopedics;  Laterality: Left;   Social History   Social History Narrative  . Not on file    There is no immunization history on file for this patient.   Objective: Vital Signs: BP 111/67 (BP Location: Right Arm, Patient Position: Sitting, Cuff Size: Normal)   Pulse 79   Resp 16   Ht 5\' 8"  (1.727 m)   Wt 267 lb 12.8 oz (121.5 kg)   BMI 40.72 kg/m    Physical Exam Vitals and nursing note reviewed.  Constitutional:      Appearance: He is well-developed.  HENT:     Head: Normocephalic and atraumatic.  Eyes:  Conjunctiva/sclera: Conjunctivae normal.     Pupils: Pupils are equal, round, and reactive to light.  Pulmonary:     Effort: Pulmonary effort is normal.  Abdominal:     General: Bowel sounds are normal.     Palpations: Abdomen is soft.  Musculoskeletal:     Cervical back: Normal range of motion and neck supple.  Skin:    General: Skin is warm and dry.     Capillary Refill: Capillary refill takes less than 2 seconds.  Neurological:     Mental Status: He is alert and oriented to person, place, and time.  Psychiatric:        Behavior: Behavior normal.      Musculoskeletal Exam: C-spine, thoracic spine, and lumbar spine good ROM.  Shoulder joints good ROM.  Right elbow joint contracture. Left elbow good ROM.  Wrist joints, MCPs, PIPs, and DIPs good ROM. Dupuytren's contracture left 3rd and 4th. Bilateral carpal tunnel release.  PIP and DIP thickening consistent with osteoarthritis of both hands.  Hip joints, knee joints, ankle joints,  MTPs, PIPs, and DIPs good ROM with no synovitis.  No warmth or effusion of knee joints.  No tenderness or swelling of ankle joints.   Involuntary muscle spasms and jerking in bilateral upper extremities.  No tremor noted.   Patellar reflexes normal bilaterally.    CDAI Exam: CDAI Score: - Patient Global: -; Provider Global: - Swollen: -; Tender: - Joint Exam 08/06/2019   No joint exam has been documented for this visit   There is currently no information documented on the homunculus. Go to the Rheumatology activity and complete the homunculus joint exam.  Investigation: No additional findings.  Imaging: No results found.  Recent Labs: Lab Results  Component Value Date   WBC 7.3 03/18/2019   HGB 15.1 03/18/2019   PLT 282 03/18/2019   NA 136 03/18/2019   K 4.6 03/18/2019   CL 101 03/18/2019   CO2 25 03/18/2019   GLUCOSE 92 03/18/2019   BUN 20 03/18/2019   CREATININE 0.97 03/18/2019   BILITOT 0.4 03/18/2019   ALKPHOS 72 09/14/2018   AST 28 03/18/2019   ALT 19 03/18/2019   PROT 6.3 03/18/2019   ALBUMIN 3.8 09/14/2018   CALCIUM 9.6 03/18/2019   GFRAA 93 03/18/2019    Speciality Comments: PLQ Eye Exam: 11/15/18 WNL @ [facility]   Procedures:  No procedures performed Allergies: Codeine, Dilaudid [hydromorphone hcl], Fentanyl, Nucynta [tapentadol], and Penicillins   Assessment / Plan:     Visit Diagnoses: Rheumatoid arthritis of multiple sites with negative rheumatoid factor ([place]): He has no synovitis on exam.  He has not had any recent rheumatoid arthritis flares.  He is clinically doing well on triple therapy of methotrexate 1 mL once weekly, folic acid 2 mg by mouth daily, sulfasalazine 500 mg 2 tablets twice daily and Plaquenil 200 mg 1 tablet twice daily.  He has been tolerating this combination of medications and has not missed any doses recently.  He is not having any joint pain or joint swelling at this time.  He has been experiencing sharp pain and muscle spasms in  bilateral lower extremities which has progressively been getting worse over the past 2 weeks.  He is currently taking Robaxin 500 mg every 6 hours as needed and Medrol dosepak prescribed by Dr. Ninfa Linden.  He will continue on Abitrexate, Plaquenil, and sulfasalazine as prescribed.  He was advised to notify us if he develops increased joint pain or joint swelling.  He will follow-up in the office in 2 months.- Plan: CBC with Differential/Platelet, COMPLETE METABOLIC PANEL WITH GFR  High risk medication use - Methotrexate 1 mL every 7 days, folic acid 1 mg 2 tablets daily, sulfasalazine 500 mg 2 tablets twice daily, and Plaquenil 200 mg 1 tablet twice daily.  CBC and CMP were drawn today to monitor for drug toxicity.  Plaquenil eye exam was updated on 11/15/2018.- Plan: CBC with Differential/Platelet, COMPLETE METABOLIC PANEL WITH GFR  Primary osteoarthritis of both hands: He has PIP and DIP thickening consistent with osteoarthritis of both hands.  No tenderness or synovitis noted.  Joint protection and muscle strengthening were discussed.   Hx of total knee replacement, bilateral: Doing well.  He has good range of motion with no discomfort at this time.  DDD (degenerative disc disease), cervical: He has good range of motion of the C-spine with no discomfort.  No symptoms of radiculopathy.  DDD (degenerative disc disease), lumbar - with spinal stenosis: He has intermittent discomfort.  No symptoms of radiculopathy.   Muscle spasm: He has been experiencing frequent and severe muscle spasms in bilateral upper extremities for the past 2 weeks. His symptoms initially started about 2 months ago but were very infrequent and mild.  There have been no aggravating or alleviating factors he has had severe nocturnal pain which has led to excessive daytime drowsiness.  He had one occurrence of falling asleep while standing which led to a fall.  He has been having fleeting sharp pains which has led to spasms as well as  involuntary muscle jerking intermittently in both upper extremities.  He has no muscle weakness or symptoms in his lower extremities.  No joint pain or joint swelling noted.  No paresthesias. No tremor noted.   He was evaluated by Dr. Ninfa Linden on 08/04/2019 who prescribed Robaxin 500 mg every 6 hours as needed for muscle spasms as well as a Medrol Dosepak.  His symptoms have become slightly less frequent and severe but he is still in considerable discomfort.  He reports of the pain is brought him to tears several times daily.  We will refer him to neurology for further evaluation.  Other medical conditions are listed as follows:   History of melanoma  Neuralgia - Plan: Ambulatory referral to Neurology  History of obesity    Orders: Orders Placed This Encounter  Procedures  . CBC with Differential/Platelet  . COMPLETE METABOLIC PANEL WITH GFR  . Ambulatory referral to Neurology   No orders of the defined types were placed in this encounter.   Face-to-face time spent with patient was 30 minutes. Greater than 50% of time was spent in counseling and coordination of care.  Follow-Up Instructions: Return in about 2 months (around 10/06/2019) for Rheumatoid arthritis, Osteoarthritis.   Ofilia Neas, PA-C   I examined and evaluated the patient with Hazel Sams PA.  Patient had no synovitis on my examination.  He has been experiencing increased muscle spasm and severe pain in his upper extremities.  He was placed on multiple medications including steroid pack by Dr. Ninfa Linden.  Which is given him some relief.  He states the pain has been so severe that he has been in tears and is unable to sleep.  We have referred him to neurology.  He has an appointment  tomorrow.  The plan of care was discussed as noted above.  Bo Merino, MD  Note - This record has been created using Editor, commissioning.  Chart creation errors have  been sought, but may not always  have been located. Such creation errors  do not reflect on  the standard of medical care.

## 2019-08-04 ENCOUNTER — Ambulatory Visit (INDEPENDENT_AMBULATORY_CARE_PROVIDER_SITE_OTHER): Payer: Medicare Other | Admitting: Orthopaedic Surgery

## 2019-08-04 ENCOUNTER — Encounter: Payer: Self-pay | Admitting: Orthopaedic Surgery

## 2019-08-04 ENCOUNTER — Other Ambulatory Visit: Payer: Self-pay

## 2019-08-04 DIAGNOSIS — M79601 Pain in right arm: Secondary | ICD-10-CM | POA: Diagnosis not present

## 2019-08-04 DIAGNOSIS — M79602 Pain in left arm: Secondary | ICD-10-CM | POA: Diagnosis not present

## 2019-08-04 MED ORDER — METHYLPREDNISOLONE 4 MG PO TABS: ORAL_TABLET | ORAL | 0 refills | Status: DC

## 2019-08-04 MED ORDER — HYDROCODONE-ACETAMINOPHEN 5-325 MG PO TABS: 1.0000 | ORAL_TABLET | Freq: Four times a day (QID) | ORAL | 0 refills | Status: DC | PRN

## 2019-08-04 MED ORDER — METHOCARBAMOL 500 MG PO TABS: 500.0000 mg | ORAL_TABLET | Freq: Four times a day (QID) | ORAL | 0 refills | Status: DC | PRN

## 2019-08-04 NOTE — Progress Notes (Signed)
Office Visit Note   Patient: David [name]           Date of Birth: 25-Mar-1951           MRN: [id number] Visit Date: 08/04/2019              Requested by: Orpah Melter, MD [address] [place],  [place] [address] PCP: Orpah Melter, MD   Assessment & Plan: Visit Diagnoses:  1. Bilateral arm pain     Plan: I am not sure as to why his pain is so severe in his upper extremities.  It does not seem to be neck related.  This certainly could be a flareup of his rheumatologic issues.  He does have appointment with rheumatology next week but he cannot wait for that appointment due to the severity of his pain.  I want to start him on a 6-day steroid taper as well as a combination of Robaxin and some Norco to see if we can calm down the acute pain while he awaits his rheumatologic appointment.  All questions and concerns were answered and addressed.  Follow-Up Instructions: Return if symptoms worsen or fail to improve.   Orders:  No orders of the defined types were placed in this encounter.  No orders of the defined types were placed in this encounter.     Procedures: No procedures performed   Clinical Data: No additional findings.   Subjective: Chief Complaint  Patient presents with  . Left Shoulder - Pain  . Right Shoulder - Pain  Morning the patient comes in today with significant pain in his bilateral upper extremities.  It has been getting rapidly worse for him in a short amount of time.  He is a patient of rheumatology and is on Plaquenil as well as gabapentin and methotrexate.  He is very tearful in the office today.  He says he gets sharp stabbing pains in his arms.  He has had x-rays of his shoulders last year which did not show any significant findings.  He has not been able to get sleep at all this week.  He says he could almost not get here due to a flareup of pain while he was driving.  HPI  Review of Systems He currently denies any headache, chest pain,  shortness of breath, fever, chills, nausea, vomiting  Objective: Vital Signs: There were no vitals taken for this visit.  Physical Exam He is alert and oriented x3 and in no acute distress but obvious comfort and very tearful Ortho Exam Examination of his bilateral upper extremities shows no acute findings.  Both shoulders are well located.  Both elbows move well.  Neither hand is swollen.  Both wrists move well.  He did get some "jolts of pain" after I examined him and became very tearful again. Specialty Comments:  No specialty comments available.  Imaging: No results found.   PMFS History: Patient Active Problem List   Diagnosis Date Noted  . SBO (small bowel obstruction) ([facility]) 08/21/2017  . Trochanteric bursitis of left hip 01/18/2017  . Lateral epicondylitis, right elbow 01/18/2017  . Rheumatoid arthritis ([place]) 03/15/2016  . DJD (degenerative joint disease), cervical 03/15/2016  . DDD (degenerative disc disease), lumbar 03/15/2016  . HTN (hypertension) 03/15/2016  . Obesity 03/15/2016  . Elevated cholesterol 03/15/2016  . Neuralgia 03/15/2016  . Malignant melanoma of right side of neck ([place]) 03/15/2016  . BBB (bundle branch block) 03/15/2016  . High risk medication use 03/15/2016  .  Osteoarthritis of lumbar spine 03/15/2016  . Primary osteoarthritis of both hands 03/15/2016  . Primary osteoarthritis of both knees 03/15/2016  . Mesenteric adenitis 06/29/2015   Past Medical History:  Diagnosis Date  . Arthritis   . BBB (bundle branch block) 03/15/2016  . Carpal tunnel syndrome   . DDD (degenerative disc disease), lumbar 03/15/2016  . Depression   . Dysrhythmia    had some tachycardia with steroids  . Elevated cholesterol 03/15/2016  . GERD (gastroesophageal reflux disease)    no meds since gastric banding  . Headache(784.0)    Ocular Migraines - takes Ambien for it  . HTN (hypertension) 03/15/2016  . Hyperlipemia   . Hypertension   . Malignant melanoma of  right side of neck ([facility]) 03/15/2016   30 years ago   . Neuralgia 03/15/2016  . Rheumatoid arthritis ([place]) 03/15/2016   Sero Negative.   Marland Kitchen RLS (restless legs syndrome)   . Shortness of breath    DOE  . Sleep apnea    could not use a cpap  . Snores   . Status post gastric banding   . Wears glasses    driving    Family History  Problem Relation Age of Onset  . Heart disease Mother   . Alzheimer's disease Father     Past Surgical History:  Procedure Laterality Date  . CARPAL TUNNEL RELEASE Right 09/26/2012   Procedure: CARPAL TUNNEL RELEASE;  Surgeon: Tennis Must, MD;  Location: [facility];  Service: Orthopedics;  Laterality: Right;  . CARPAL TUNNEL RELEASE Left 10/24/2012   Procedure: CARPAL TUNNEL RELEASE;  Surgeon: Tennis Must, MD;  Location: [facility];  Service: Orthopedics;  Laterality: Left;  . EXCISION MELANOMA WITH SENTINEL LYMPH NODE BIOPSY  1984   rt neck with mulpipal nodes and some muscle excised rt neck-shoulder  . KNEE ARTHROPLASTY    . knee replacement    . LAPAROSCOPIC GASTRIC BANDING  03/15/2009  . LAPAROSCOPIC GASTRIC BANDING    . MUSCLE BIOPSY  2004   rt thigh to r/o myocitis  . TONSILLECTOMY    . TOTAL KNEE ARTHROPLASTY Bilateral 12/05/2010  . TRIGGER FINGER RELEASE Right 09/26/2012   Procedure: RELEASE TRIGGER FINGER/A-1 PULLEY RING AND SMALL ;  Surgeon: Tennis Must, MD;  Location: [facility];  Service: Orthopedics;  Laterality: Right;  . TRIGGER FINGER RELEASE Left 10/24/2012   Procedure: RELEASE TRIGGER FINGER/A-1 PULLEY LEFT MIDDLE AND LEFT RING;  Surgeon: Tennis Must, MD;  Location: [facility];  Service: Orthopedics;  Laterality: Left;   Social History   Occupational History  . Not on file  Tobacco Use  . Smoking status: Former Smoker    Packs/day: 1.00    Years: 30.00    Pack years: 30.00    Types: Cigarettes    Quit date: 09/23/2001    Years since quitting: 17.8  . Smokeless tobacco:  Never Used  Substance and Sexual Activity  . Alcohol use: Yes    Comment: rare  . Drug use: No  . Sexual activity: Not on file

## 2019-08-06 ENCOUNTER — Other Ambulatory Visit: Payer: Self-pay

## 2019-08-06 ENCOUNTER — Encounter: Payer: Self-pay | Admitting: Physician Assistant

## 2019-08-06 ENCOUNTER — Ambulatory Visit (INDEPENDENT_AMBULATORY_CARE_PROVIDER_SITE_OTHER): Payer: Medicare Other | Admitting: Rheumatology

## 2019-08-06 VITALS — BP 111/67 | HR 79 | Resp 16 | Ht 68.0 in | Wt 267.8 lb

## 2019-08-06 DIAGNOSIS — Z96653 Presence of artificial knee joint, bilateral: Secondary | ICD-10-CM | POA: Diagnosis not present

## 2019-08-06 DIAGNOSIS — M792 Neuralgia and neuritis, unspecified: Secondary | ICD-10-CM | POA: Diagnosis not present

## 2019-08-06 DIAGNOSIS — M5030 Other cervical disc degeneration, unspecified cervical region: Secondary | ICD-10-CM | POA: Diagnosis not present

## 2019-08-06 DIAGNOSIS — M19041 Primary osteoarthritis, right hand: Secondary | ICD-10-CM

## 2019-08-06 DIAGNOSIS — Z85820 Personal history of malignant melanoma of skin: Secondary | ICD-10-CM | POA: Diagnosis not present

## 2019-08-06 DIAGNOSIS — M19042 Primary osteoarthritis, left hand: Secondary | ICD-10-CM

## 2019-08-06 DIAGNOSIS — M0609 Rheumatoid arthritis without rheumatoid factor, multiple sites: Secondary | ICD-10-CM

## 2019-08-06 DIAGNOSIS — M51369 Other intervertebral disc degeneration, lumbar region without mention of lumbar back pain or lower extremity pain: Secondary | ICD-10-CM

## 2019-08-06 DIAGNOSIS — M5136 Other intervertebral disc degeneration, lumbar region: Secondary | ICD-10-CM | POA: Diagnosis not present

## 2019-08-06 DIAGNOSIS — Z79899 Other long term (current) drug therapy: Secondary | ICD-10-CM

## 2019-08-06 DIAGNOSIS — M62838 Other muscle spasm: Secondary | ICD-10-CM

## 2019-08-06 DIAGNOSIS — Z8639 Personal history of other endocrine, nutritional and metabolic disease: Secondary | ICD-10-CM | POA: Diagnosis not present

## 2019-08-06 NOTE — Patient Instructions (Signed)
Standing Labs We placed an order today for your standing lab work.    Please come back and get your standing labs in June  We have open lab daily Monday through Thursday from 8:30-12:30 PM and 1:30-4:30 PM and Friday from 8:30-12:30 PM and 1:30-4:00 PM at the office of Dr. Bo Merino.   You may experience shorter wait times on Monday and Friday afternoons. The office is located at [address] [place], [place], [place] [address] No appointment is necessary.   Labs are drawn by [facility].  You may receive a bill from [facility] for your lab work.  If you wish to have your labs drawn at another location, please call the office 24 hours in advance to send orders.  If you have any questions regarding directions or hours of operation,  please call [phone].   Just as a reminder please drink plenty of water prior to coming for your lab work. Thanks!

## 2019-08-07 ENCOUNTER — Other Ambulatory Visit: Payer: Self-pay

## 2019-08-07 ENCOUNTER — Encounter: Payer: Self-pay | Admitting: Neurology

## 2019-08-07 ENCOUNTER — Ambulatory Visit (INDEPENDENT_AMBULATORY_CARE_PROVIDER_SITE_OTHER): Payer: Medicare Other | Admitting: Neurology

## 2019-08-07 VITALS — BP 154/90 | HR 84 | Temp 97.0°F | Ht 68.0 in | Wt 264.0 lb

## 2019-08-07 DIAGNOSIS — M79601 Pain in right arm: Secondary | ICD-10-CM | POA: Diagnosis not present

## 2019-08-07 DIAGNOSIS — M79602 Pain in left arm: Secondary | ICD-10-CM | POA: Diagnosis not present

## 2019-08-07 NOTE — Progress Notes (Signed)
Subjective:    Patient ID: David [name] is a 69 y.o. male.  HPI     Star Age, MD, PhD [facility] [address] [place], [place] [address]  Dear David [name],   I saw your patient, David [name], upon your kind request in my neurologic clinic today for initial consultation of his upper extremity pain.  The patient is unaccompanied today.  As you know, David [name] is a 69 year old right-handed gentleman with an underlying complex medical history of osteoarthritis, rheumatoid arthritis, restless leg syndrome, morbid obesity with a BMI of over 40, obstructive sleep apnea, history of melanoma, hypertension, hyperlipidemia, reflux disease, carpal tunnel syndrome, degenerative disc disease, depression, status post weight loss surgery, history of bundle branch block, who reports an approximately 20-year history of intermittent sharp pains affecting both upper extremities in various areas.  He denies any radiating pain, any specific area that hurts, it is a shocklike stabbing sensation in different parts of the upper extremities, including hands, wrist area, forearms, elbow area, upper arms and shoulder areas.  He reports that some 20 years ago he saw multiple doctors and specialists for this including to rheumatologist, neurologist, and internal medicine doctor.  He reports that he had a muscle biopsy which may have been normal.  1 rheumatologist felt that he had fibromyalgia.  He was in pain management up until 2 years ago.  He reports that he took morphine.  He reports that he lost his prescription and could not get a refill.  He reports that he went through withdrawals and was treated symptomatically by Dr. Ninfa Linden with gabapentin.  He then switched to Lyrica for about a year and is now back on gabapentin.  He denies any similar pain in the lower extremities, his strength is good, he has a history of melanoma removal from the back of his neck or base of  the head and also surgery to his right neck for this.  He reports that some years ago he had a neck MRI.  He also had a lumbar spine MRI because of burning sensation in the right thigh area.  He was seen by an orthopedic specialist but with time his symptoms improved.  For the past 2 months his upper extremity pain has been more severe and more frequent. I reviewed your office records.  He has had multiple recent x-rays. He also recently saw orthopedics, Dr. Ninfa Linden on 08/04/2019 for the same issue and was given a trial of steroid Dosepak as well as Norco as needed.  No obvious cause was found and pain was not deemed neck related.    His Past Medical History Is Significant For: Past Medical History:  Diagnosis Date  . Arthritis   . BBB (bundle branch block) 03/15/2016  . Carpal tunnel syndrome   . DDD (degenerative disc disease), lumbar 03/15/2016  . Depression   . Dysrhythmia    had some tachycardia with steroids  . Elevated cholesterol 03/15/2016  . GERD (gastroesophageal reflux disease)    no meds since gastric banding  . Headache(784.0)    Ocular Migraines - takes Ambien for it  . HTN (hypertension) 03/15/2016  . Hyperlipemia   . Hypertension   . Malignant melanoma of right side of neck ([facility]) 03/15/2016   30 years ago   . Neuralgia 03/15/2016  . Rheumatoid arthritis ([facility]) 03/15/2016   Sero Negative.   Marland Kitchen RLS (restless legs syndrome)   . Shortness of breath    DOE  .  Sleep apnea    could not use a cpap  . Snores   . Status post gastric banding   . Wears glasses    driving    His Past Surgical History Is Significant For: Past Surgical History:  Procedure Laterality Date  . CARPAL TUNNEL RELEASE Right 09/26/2012   Procedure: CARPAL TUNNEL RELEASE;  Surgeon: Tennis Must, MD;  Location: [facility];  Service: Orthopedics;  Laterality: Right;  . CARPAL TUNNEL RELEASE Left 10/24/2012   Procedure: CARPAL TUNNEL RELEASE;  Surgeon: Tennis Must, MD;  Location: [facility];  Service: Orthopedics;  Laterality: Left;  . EXCISION MELANOMA WITH SENTINEL LYMPH NODE BIOPSY  1984   rt neck with mulpipal nodes and some muscle excised rt neck-shoulder  . KNEE ARTHROPLASTY    . knee replacement    . LAPAROSCOPIC GASTRIC BANDING  03/15/2009  . LAPAROSCOPIC GASTRIC BANDING    . MUSCLE BIOPSY  2004   rt thigh to r/o myocitis  . TONSILLECTOMY    . TOTAL KNEE ARTHROPLASTY Bilateral 12/05/2010  . TRIGGER FINGER RELEASE Right 09/26/2012   Procedure: RELEASE TRIGGER FINGER/A-1 PULLEY RING AND SMALL ;  Surgeon: Tennis Must, MD;  Location: [facility];  Service: Orthopedics;  Laterality: Right;  . TRIGGER FINGER RELEASE Left 10/24/2012   Procedure: RELEASE TRIGGER FINGER/A-1 PULLEY LEFT MIDDLE AND LEFT RING;  Surgeon: Tennis Must, MD;  Location: [facility];  Service: Orthopedics;  Laterality: Left;    His Family History Is Significant For: Family History  Problem Relation Age of Onset  . Heart disease Mother   . Alzheimer's disease Father     His Social History Is Significant For: Social History   Socioeconomic History  . Marital status: Married    Spouse name: Not on file  . Number of children: Not on file  . Years of education: Not on file  . Highest education level: Not on file  Occupational History  . Not on file  Tobacco Use  . Smoking status: Former Smoker    Packs/day: 1.00    Years: 30.00    Pack years: 30.00    Types: Cigarettes    Quit date: 09/23/2001    Years since quitting: 17.8  . Smokeless tobacco: Never Used  Substance and Sexual Activity  . Alcohol use: Yes    Comment: rare  . Drug use: No  . Sexual activity: Not on file  Other Topics Concern  . Not on file  Social History Narrative  . Not on file   Social Determinants of Health   Financial Resource Strain:   . Difficulty of Paying Living Expenses:   Food Insecurity:   . Worried About Charity fundraiser in the Last Year:   . Youth worker in the Last Year:   Transportation Needs:   . Film/video editor (Medical):   Marland Kitchen Lack of Transportation (Non-Medical):   Physical Activity:   . Days of Exercise per Week:   . Minutes of Exercise per Session:   Stress:   . Feeling of Stress :   Social Connections:   . Frequency of Communication with Friends and Family:   . Frequency of Social Gatherings with Friends and Family:   . Attends Religious Services:   . Active Member of Clubs or Organizations:   . Attends Archivist Meetings:   Marland Kitchen Marital Status:     His Allergies Are:  Allergies  Allergen  Reactions  . Codeine Itching  . Dilaudid [Hydromorphone Hcl] Itching  . Fentanyl Itching    Pt states Fentanyl patch causes itching  . Nucynta [Tapentadol]     MENTAL PROBLEMS  . Penicillins Itching    Has patient had a PCN reaction causing immediate rash, facial/tongue/throat swelling, SOB or lightheadedness with hypotension:unsure childhood reaction Has patient had a PCN reaction causing severe rash involving mucus membranes or skin necrosis:unsure Has patient had a PCN reaction that required hospitalization:No Has patient had a PCN reaction occurring within the last 10 years:NO If all of the above answers are "NO", then may proceed with Cephalosporin use.   :   His Current Medications Are:  Outpatient Encounter Medications as of 08/07/2019  Medication Sig  . DULoxetine (CYMBALTA) 60 MG capsule Take 60 mg by mouth 2 (two) times daily.   . folic acid (FOLVITE) 1 MG tablet TAKE TWO TABLETS BY MOUTH DAILY  . gabapentin (NEURONTIN) 300 MG capsule   . hydrochlorothiazide (HYDRODIURIL) 25 MG tablet Take 25 mg by mouth daily.  Marland Kitchen HYDROcodone-acetaminophen (NORCO/VICODIN) 5-325 MG tablet Take 1-2 tablets by mouth every 6 (six) hours as needed for moderate pain.  . hydroxychloroquine (PLAQUENIL) 200 MG tablet TAKE ONE TABLET BY MOUTH TWICE A DAY  . methocarbamol (ROBAXIN) 500 MG tablet Take 1 tablet (500 mg total) by  mouth every 6 (six) hours as needed for muscle spasms.  . methotrexate 50 MG/2ML injection INJECT 1 ML UNDER THE SKIN ONCE WEEKLY  . methylPREDNISolone (MEDROL) 4 MG tablet Medrol dose pack. Take as instructed  . metoprolol succinate (TOPROL-XL) 25 MG 24 hr tablet Take 25 mg by mouth daily.  Marland Kitchen rOPINIRole (REQUIP) 4 MG tablet Take 4 mg by mouth every evening.   . simvastatin (ZOCOR) 40 MG tablet Take 40 mg by mouth daily.  Marland Kitchen sulfaSALAzine (AZULFIDINE) 500 MG EC tablet TAKE TWO TABLETS BY MOUTH TWICE A DAY  . tamsulosin (FLOMAX) 0.4 MG CAPS capsule Take 0.4 mg by mouth daily.   . Tuberculin-Allergy Syringes 27G X 1/2" 1 ML MISC Patient to inject MTX once weekly  . predniSONE (DELTASONE) 5 MG tablet Take 4 tablets (20 mg) by mouth daily x 1 week, take 3 tablets by mouth daily (15 mg) x 1 week, take 2 tablets by mouth daily (10 mg) x 1 week, take 1 tablet by mouth daily x 1 week (5 mg). (Patient not taking: Reported on 08/07/2019)   No facility-administered encounter medications on file as of 08/07/2019.  :   Review of Systems:  Out of a complete 14 point review of systems, all are reviewed and negative with the exception of these symptoms as listed below:  Review of Systems  Neurological:       Pt complains of Twitching and pain    Objective:  Neurological Exam  Physical Exam Physical Examination:   Vitals:   08/07/19 1308  BP: (!) 154/90  Pulse: 84  Temp: (!) 97 F (36.1 C)   General Examination: The patient is a very pleasant 69 y.o. male in no acute distress. He appears well-developed and well-nourished and adequately groomed.   HEENT: Normocephalic, atraumatic, pupils are equal, round and reactive to light, extraocular tracking is good without limitation to gaze excursion or nystagmus noted. Normal smooth pursuit is noted. Hearing is grossly intact. Tympanic membranes are clear bilaterally. Face is symmetric with normal facial animation and normal facial sensation. Speech is  clear with no dysarthria noted. There is no hypophonia. There is no lip,  neck/head, jaw or voice tremor. Neck is supple with full range of passive and active motion. There are no carotid bruits on auscultation. Oropharynx exam reveals: moderate mouth dryness, adequate dental hygiene. Tongue protrudes centrally and palate elevates symmetrically. Scar R ant neck, well healed.   Chest: Clear to auscultation without wheezing, rhonchi or crackles noted.  Heart: S1+S2+0, regular and normal without murmurs, rubs or gallops noted.   Abdomen: Soft, non-tender and non-distended with normal bowel sounds appreciated on auscultation.  Extremities: There is no pitting edema in the distal lower extremities bilaterally. Pedal pulses are intact.  Skin: Warm and dry without trophic changes noted. There are no varicose veins.  Musculoskeletal: exam reveals bilateral knee scars from knee replacement surgeries, right shoulder muscle profile smaller than left from prior surgery.   Neurologically:  Mental status: The patient is awake, alert and oriented in all 4 spheres. His immediate and remote memory, attention, language skills and fund of knowledge are appropriate. There is no evidence of aphasia, agnosia, apraxia or anomia. Speech is clear with normal prosody and enunciation. Thought process is linear. Mood is normal and affect is normal.  Cranial nerves II - XII are as described above under HEENT exam. In addition: shoulder shrug is normal with equal shoulder height noted. Motor exam: Normal bulk, strength and tone is noted. There is no drift, tremor or rebound. Romberg is negative.  He has intermittent upper body movements and jerking from pain.  No abnormal involuntary movements, no fasciculations noted, no athetoid movements, no tremor noted. Reflexes are 1+ in the UEs and trace in the LEs. Toes are flexor bilaterally. Fine motor skills and coordination: intact with normal finger taps, normal hand movements,  normal rapid alternating patting, normal foot taps and normal foot agility.  Cerebellar testing: No dysmetria or intention tremor on finger to nose testing. Heel to shin is unremarkable bilaterally. There is no truncal or gait ataxia.  Sensory exam: intact to light touch, pinprick, vibration, temperature in the upper and lower extremities.  Gait, station and balance: He stands easily. No veering to one side is noted. No leaning to one side is noted. Posture is age-appropriate and stance is narrow based. Gait shows normal stride length and normal pace. No problems turning are noted.   Assessment and Plan:   Assessment and Plan:  In summary, David [name] is a very pleasant 69 y.o.-year old male with an underlying complex medical history of osteoarthritis, rheumatoid arthritis, restless leg syndrome, morbid obesity with a BMI of over 40, obstructive sleep apnea, history of melanoma, hypertension, hyperlipidemia, reflux disease, carpal tunnel syndrome, degenerative disc disease, depression, status post weight loss surgery, history of bundle branch block, who presents for evaluation of his upper extremity stabbing pains.  He reports that he has had this pain for 20 years and has essentially resolved to living with this.  He has previously had work-up through a neurologist and 2 other rheumatologists as I understand several years ago.  I am not sure as to how to explain his pain.  It does not seem the have a radicular pattern, no specific neuralgic pattern.  Symptoms and findings are in keeping with peripheral neuropathy.  Of note, he had bilateral carpal tunnel surgeries and in that context several years ago had an EMG nerve conduction study.  I suggested we check his CK level.  He reports that in the past it was elevated.  He also had a muscle biopsy but prior test results are not available for  my review today.  He was told by his neurologist that the biopsy was normal.  We will check his CK level today and  also schedule him for EMG nerve conduction velocity testing of the upper extremities.  We will keep him posted as to his test results.  At this juncture, I am afraid, I do not have a good answer for him.  For symptomatic treatment he is currently on Norco, he is also on gabapentin.  In addition he takes Cymbalta.  In the past, he was followed by pain management.  He is encouraged to talk to you about referral to pain management if needed.  I answered all his questions today and the patient was in agreement.  Thank you very much for allowing me to participate in the care of this nice patient. If I can be of any further assistance to you please do not hesitate to call me at [phone].  Sincerely,   Star Age, MD, PhD

## 2019-08-07 NOTE — Patient Instructions (Addendum)
Unfortunately, I do not have a good explanation for your stabbing pains in the upper extremities.  Your neurological exam is benign.  I do not believe that you have a pinched nerve issues in the neck.  We will check your CK level today.  Please also make sure that you follow-up with your primary care physician on a regular basis and get your thyroid function tested, screening test for diabetes and that you have your vitamin B12 level checked.  We will do an EMG and nerve conduction velocity test, which is an electrical nerve and muscle test, which we will schedule. We will call you with the results.

## 2019-08-08 ENCOUNTER — Telehealth: Payer: Self-pay | Admitting: Rheumatology

## 2019-08-08 LAB — CK: Total CK: 372 U/L — ABNORMAL HIGH (ref 41–331)

## 2019-08-08 NOTE — Telephone Encounter (Signed)
Patient's wife calling after patient has seen Neurologist. Neuro is not sure if he can help patient. In the meantime, he has been put on a Prednisone Dose pk on Monday by Dr. Ninfa Linden. Patient has gotten some relief with that, and slept thru the night for the first time in a long time. Patient takes last dose tomorrow. Patient's wife wanted to discuss possible having patient on a lose dose of Prednisone continuously to keep flare ups from happening. Please call to advise.

## 2019-08-11 ENCOUNTER — Telehealth: Payer: Self-pay

## 2019-08-11 NOTE — Telephone Encounter (Signed)
David [name] was in the office on 08/06/19 and he was referred to a neurologist. Patient saw on the neurologist on 08/07/19. Patient is scheduled for EMG nerve conduction velocity. Manuela Schwartz, patient's wife states [name] is in a significant amount of pain. She states patient is crying in pain and the pain is interrupting his sleep. Patient was given a prednisone by Dr. Rush Farmer. Manuela Schwartz states the prednisone taper appears to have helped with the pain. Manuela Schwartz states that this has been the only thing that has helped with the pain. She states the pain is affecting his quality of life. Manuela Schwartz is inquiring about the possibility of having patient on a low dose of Prednisone. Please advise.

## 2019-08-11 NOTE — Telephone Encounter (Signed)
I do not know why he is having muscle spasm and severe pain in his muscles.  Prednisone will mask symptoms and will not be a solution.  I would recommend going to the pain management if he has severe pain.  He should follow-up with the neurologist to find the cause.  If you would prefer I will be happy to refer him for a second opinion to [facility] or to [facility].

## 2019-08-11 NOTE — Telephone Encounter (Signed)
I called pt and discussed this with him. He was told at check out that he would be called to schedule his NCV/EMG. He does wish to proceed with this. Pt verbalized understanding of results. Pt had no questions at this time but was encouraged to call back if questions arise.

## 2019-08-11 NOTE — Telephone Encounter (Signed)
-----   Message from Star Age, MD sent at 08/11/2019  7:58 AM EDT ----- Please call patient and advise him that his CK level was mildly elevated. Mild elevation of CK level can be seen in variety of underlying causes.  He reported to me that it was elevated in the past as well.  As discussed, we will proceed with an EMG nerve conduction velocity test.  I do not see where he has been scheduled yet.  Please inquire with patient.

## 2019-08-11 NOTE — Progress Notes (Signed)
Please call patient and advise him that his CK level was mildly elevated. Mild elevation of CK level can be seen in variety of underlying causes.  He reported to me that it was elevated in the past as well.  As discussed, we will proceed with an EMG nerve conduction velocity test.  I do not see where he has been scheduled yet.  Please inquire with patient.

## 2019-08-11 NOTE — Telephone Encounter (Signed)
Manuela Schwartz advised Dr. Estanislado Pandy does not know why he is having muscle spasm and severe pain in his muscles.  Prednisone will mask symptoms and will not be a solution.  I would recommend going to the pain management if he has severe pain.  He should follow-up with the neurologist to find the cause.  If you would prefer I will be happy to refer him for a second opinion to [facility] or to [facility]. Manuela Schwartz states she will discuss with Mancel Bale and call to let us know if they would like to the referral for a second opinion.

## 2019-09-02 ENCOUNTER — Ambulatory Visit (INDEPENDENT_AMBULATORY_CARE_PROVIDER_SITE_OTHER): Payer: Medicare Other | Admitting: Neurology

## 2019-09-02 ENCOUNTER — Encounter: Payer: Self-pay | Admitting: Neurology

## 2019-09-02 ENCOUNTER — Other Ambulatory Visit: Payer: Self-pay

## 2019-09-02 DIAGNOSIS — M79601 Pain in right arm: Secondary | ICD-10-CM | POA: Diagnosis not present

## 2019-09-02 DIAGNOSIS — M79602 Pain in left arm: Secondary | ICD-10-CM

## 2019-09-02 DIAGNOSIS — M792 Neuralgia and neuritis, unspecified: Secondary | ICD-10-CM

## 2019-09-02 NOTE — Procedures (Signed)
     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  The patient is being evaluated for this issue.  He has had prior surgery for bilateral carpal tunnel syndrome.  NERVE CONDUCTION STUDIES:  Nerve conduction studies were performed on both upper extremities.  The distal motor latencies for the median nerves were borderline normal on the right and slightly prolonged on the left with normal motor amplitudes for these nerves bilaterally.  The distal motor latencies and motor amplitudes for the ulnar nerves were normal bilaterally.  Slight slowing was seen for the left median nerve but was normal for the right median nerve and for the ulnar nerves bilaterally.  The sensory latencies for the median nerves were slightly prolonged bilaterally, normal for the ulnar nerves bilaterally.  The F-wave latencies for the ulnar nerves were slightly prolonged on the left and normal on the right.  EMG STUDIES:  EMG study was performed on the left upper extremity:  The first dorsal interosseous muscle reveals 2 to 4 K units with full recruitment. No fibrillations or positive waves were noted. The abductor pollicis brevis muscle reveals 2 to 4 K units with full recruitment. No fibrillations or positive waves were noted. The extensor indicis proprius muscle reveals 1 to 3 K units with full recruitment. No fibrillations or positive waves were noted. The pronator teres muscle reveals 2 to 3 K units with full recruitment. No fibrillations or positive waves were noted. The biceps muscle reveals 1 to 2 K units with full recruitment. No fibrillations or positive waves were noted. The triceps muscle reveals 2 to 4 K units with full recruitment. No fibrillations or positive waves were noted. The anterior deltoid muscle reveals 2 to 3 K units with full recruitment. No fibrillations or positive waves were noted. The cervical paraspinal  muscles were tested at 2 levels. No abnormalities of insertional activity were seen at either level tested. There was good relaxation.   IMPRESSION:  Nerve conduction studies done on both upper extremities shows evidence of a borderline carpal tunnel syndrome on the right and a very mild carpal tunnel syndrome on the left.  EMG of the left upper extremity is unremarkable, no evidence of an overlying cervical radiculopathy is seen.  [name] MD 09/02/2019 2:11 PM  [facility] [address] [place], [place] [address]  Phone [phone] Fax [phone]

## 2019-09-02 NOTE — Progress Notes (Signed)
Please call and advise the patient that the recent EMG and nerve conduction velocity test, which is the electrical nerve and muscle test we we performed, was reported as showing borderline carpal tunnel type changes on the right and very mild carpal tunnel on the left and otherwise normal findings.  For carpal tunnel, he can try OTC wrist splints, at least at night or he can follow up with his hand surgeon if the carpal tunnel symptoms bother him during the day. At this juncture, he can follow up with his primary care.   Star Age, MD, PhD

## 2019-09-02 NOTE — Progress Notes (Signed)
Please refer to EMG and nerve conduction procedure note.  

## 2019-09-02 NOTE — Progress Notes (Signed)
[facility]    Nerve / Sites Muscle Latency Ref. Amplitude Ref. Rel Amp Segments Distance Velocity Ref. Area    ms ms mV mV %  cm m/s m/s mVms  R Median - APB     Wrist APB 4.0 ?4.4 9.0 ?4.0 100 Wrist - APB 7   31.5     Upper arm APB 8.5  8.9  98.3 Upper arm - Wrist 22 49 ?49 30.4  L Median - APB     Wrist APB 4.1 ?4.4 6.3 ?4.0 100 Wrist - APB 7   23.0     Upper arm APB 8.8  2.5  39 Upper arm - Wrist 21 45 ?49 9.6  R Ulnar - ADM     Wrist ADM 2.9 ?3.3 9.6 ?6.0 100 Wrist - ADM 7   34.1     B.Elbow ADM 6.5  7.0  72.9 B.Elbow - Wrist 20 54 ?49 25.9     A.Elbow ADM 8.4  8.5  122 A.Elbow - B.Elbow 10 54 ?49 33.0         A.Elbow - Wrist      L Ulnar - ADM     Wrist ADM 2.7 ?3.3 11.8 ?6.0 100 Wrist - ADM 7   40.6     B.Elbow ADM 6.1  12.4  105 B.Elbow - Wrist 19 56 ?49 40.0     A.Elbow ADM 8.0  11.7  94.3 A.Elbow - B.Elbow 10 52 ?49 39.9         A.Elbow - Wrist                 SNC    Nerve / Sites Rec. Site Peak Lat Ref.  Amp Ref. Segments Distance    ms ms V V  cm  R Median - Orthodromic (Dig II, Mid palm)     Dig II Wrist 3.5 ?3.4 5 ?10 Dig II - Wrist 13  L Median - Orthodromic (Dig II, Mid palm)     Dig II Wrist 3.5 ?3.4 8 ?10 Dig II - Wrist 13  R Ulnar - Orthodromic, (Dig V, Mid palm)     Dig V Wrist 3.0 ?3.1 6 ?5 Dig V - Wrist 11  L Ulnar - Orthodromic, (Dig V, Mid palm)     Dig V Wrist 2.8 ?3.1 5 ?5 Dig V - Wrist 65             F  Wave    Nerve F Lat Ref.   ms ms  R Ulnar - ADM 31.9 ?32.0  L Ulnar - ADM 33.4 ?32.0

## 2019-09-03 ENCOUNTER — Telehealth: Payer: Self-pay

## 2019-09-03 NOTE — Telephone Encounter (Signed)
I spoke with Dr. Jannifer Franklin and please clarify with patient that there was no obvious evidence of neuropathy. He has been on several medications for symptomatic relief of his pain. Unfortunately, I cannot think of anything else to add at this time. If he wishes to pursue a second opinion, I would recommend, he talk to his primary care physician about seeing another neurologist, I would recommend a neuromuscular specialist.

## 2019-09-03 NOTE — Telephone Encounter (Signed)
I called pt about EMG test. I stated per Dr.Athar.I stated the recent EMG and nerve conduction velocity test, which is the electrical nerve and muscle test we we performed, was reported as showing borderline carpal tunnel type changes on the right and very mild carpal tunnel on the left and otherwise normal findings.  For carpal tunnel, he can try OTC wrist splints, at least at night or he can follow up with his hand surgeon if the carpal tunnel symptoms bother him during the day. At this juncture, he can follow up with his primary care. Pt stated he carpel tunnel surgery 10 years ago and is having pain from shoulders to hands. He stated Dr. 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 Rexene Alberts to review. He verbalized understanding.

## 2019-09-03 NOTE — Telephone Encounter (Signed)
Sent to Dr Rexene Alberts

## 2019-09-03 NOTE — Telephone Encounter (Signed)
-----   Message from Star Age, MD sent at 09/02/2019  4:17 PM EDT ----- Please call and advise the patient that the recent EMG and nerve conduction velocity test, which is the electrical nerve and muscle test we we performed, was reported as showing borderline carpal tunnel type changes on the right and very mild carpal tunnel on the left and otherwise normal findings.  For carpal tunnel, he can try OTC wrist splints, at least at night or he can follow up with his hand surgeon if the carpal tunnel symptoms bother him during the day. At this juncture, he can follow up with his primary care.   Star Age, MD, PhD

## 2019-09-08 NOTE — Telephone Encounter (Signed)
I called pt and gave him Dr.Athar recommendations listed below. Pt verbalized understanding of the EMG results and knows he can get call his PCP for second opinion.

## 2019-09-17 DIAGNOSIS — G4752 REM sleep behavior disorder: Secondary | ICD-10-CM | POA: Diagnosis not present

## 2019-09-17 DIAGNOSIS — G4733 Obstructive sleep apnea (adult) (pediatric): Secondary | ICD-10-CM | POA: Diagnosis not present

## 2019-09-17 DIAGNOSIS — G8929 Other chronic pain: Secondary | ICD-10-CM | POA: Diagnosis not present

## 2019-09-19 ENCOUNTER — Other Ambulatory Visit: Payer: Self-pay | Admitting: Rheumatology

## 2019-09-19 DIAGNOSIS — Z79899 Other long term (current) drug therapy: Secondary | ICD-10-CM

## 2019-09-19 NOTE — Telephone Encounter (Signed)
Last Visit: 08/06/2019 Next Visit: 10/06/2019 Labs: 03/18/2019 CMP WNL. Absolute monocytes are elevated but stable. Rest of CBC WNL. Eye exam: 11/15/2018  Advised patient that he is due to update labs. Patient will come by the office today to have labs drawn.   Okay to refill 30 day supply of plaquenil?  Patient is also requesting a low dose of steroids, patient states he discussed this with Dr. Estanislado Pandy prior to seeing the neurologist and was advised it would mask the symptoms. Patient has now seen the neurologist and would like low dose steroids. Please advise.

## 2019-09-22 ENCOUNTER — Other Ambulatory Visit: Payer: Self-pay

## 2019-09-22 ENCOUNTER — Other Ambulatory Visit (HOSPITAL_BASED_OUTPATIENT_CLINIC_OR_DEPARTMENT_OTHER): Payer: Self-pay

## 2019-09-22 DIAGNOSIS — G4752 REM sleep behavior disorder: Secondary | ICD-10-CM

## 2019-09-22 DIAGNOSIS — R0683 Snoring: Secondary | ICD-10-CM

## 2019-09-22 DIAGNOSIS — G4719 Other hypersomnia: Secondary | ICD-10-CM

## 2019-09-22 DIAGNOSIS — Z79899 Other long term (current) drug therapy: Secondary | ICD-10-CM

## 2019-09-22 DIAGNOSIS — R0681 Apnea, not elsewhere classified: Secondary | ICD-10-CM

## 2019-09-22 NOTE — Telephone Encounter (Signed)
Dr. Estanislado Pandy recommends a referral to [facility] neurology for further evaluation.   She does not recommend prednisone at this time since there is not a definite diagnosis.    Ok to refill 30 day supply of PLQ. Please advised the patient to update lab work ASAP.

## 2019-09-22 NOTE — Telephone Encounter (Signed)
Advised patient Dr. Estanislado Pandy recommends a referral to [facility] neurology for further evaluation.   She does not recommend prednisone at this time since there is not a definite diagnosis.  patient declined a referral to [facility] neurology since we received the diagnosis of recurrent carpal tunnel, he will follow up with Dr. Ninfa Linden for that. Patient will discuss treatment options at upcoming visit.   Ok to refill 30 day supply of PLQ. Per patient, he updated labs today.

## 2019-09-23 ENCOUNTER — Telehealth: Payer: Self-pay | Admitting: Rheumatology

## 2019-09-23 ENCOUNTER — Other Ambulatory Visit: Payer: Self-pay | Admitting: Rheumatology

## 2019-09-23 DIAGNOSIS — M0609 Rheumatoid arthritis without rheumatoid factor, multiple sites: Secondary | ICD-10-CM

## 2019-09-23 DIAGNOSIS — Z79899 Other long term (current) drug therapy: Secondary | ICD-10-CM

## 2019-09-23 LAB — CBC WITH DIFFERENTIAL/PLATELET
Absolute Monocytes: 558 cells/uL (ref 200–950)
Basophils Absolute: 41 cells/uL (ref 0–200)
Basophils Relative: 0.6 %
Eosinophils Absolute: 143 cells/uL (ref 15–500)
Eosinophils Relative: 2.1 %
HCT: 47.8 % (ref 38.5–50.0)
Hemoglobin: 15.4 g/dL (ref 13.2–17.1)
Lymphs Abs: 1843 cells/uL (ref 850–3900)
MCH: 30.6 pg (ref 27.0–33.0)
MCHC: 32.2 g/dL (ref 32.0–36.0)
MCV: 95 fL (ref 80.0–100.0)
MPV: 9.7 fL (ref 7.5–12.5)
Monocytes Relative: 8.2 %
Neutro Abs: 4216 cells/uL (ref 1500–7800)
Neutrophils Relative %: 62 %
Platelets: 285 10*3/uL (ref 140–400)
RBC: 5.03 10*6/uL (ref 4.20–5.80)
RDW: 14 % (ref 11.0–15.0)
Total Lymphocyte: 27.1 %
WBC: 6.8 10*3/uL (ref 3.8–10.8)

## 2019-09-23 LAB — COMPLETE METABOLIC PANEL WITH GFR
AG Ratio: 1.8 (calc) (ref 1.0–2.5)
ALT: 25 U/L (ref 9–46)
AST: 31 U/L (ref 10–35)
Albumin: 4.1 g/dL (ref 3.6–5.1)
Alkaline phosphatase (APISO): 67 U/L (ref 35–144)
BUN: 18 mg/dL (ref 7–25)
CO2: 32 mmol/L (ref 20–32)
Calcium: 9.8 mg/dL (ref 8.6–10.3)
Chloride: 102 mmol/L (ref 98–110)
Creat: 1.06 mg/dL (ref 0.70–1.25)
GFR, Est African American: 84 mL/min/{1.73_m2} (ref >=60)
GFR, Est Non African American: 72 mL/min/{1.73_m2} (ref >=60)
Globulin: 2.3 g/dL (calc) (ref 1.9–3.7)
Glucose, Bld: 166 mg/dL — ABNORMAL HIGH (ref 65–99)
Potassium: 4.7 mmol/L (ref 3.5–5.3)
Sodium: 140 mmol/L (ref 135–146)
Total Bilirubin: 0.4 mg/dL (ref 0.2–1.2)
Total Protein: 6.4 g/dL (ref 6.1–8.1)

## 2019-09-23 MED ORDER — HYDROXYCHLOROQUINE SULFATE 200 MG PO TABS: 200.0000 mg | ORAL_TABLET | Freq: Two times a day (BID) | ORAL | 0 refills | Status: DC

## 2019-09-23 MED ORDER — METHOTREXATE SODIUM CHEMO INJECTION 50 MG/2ML: INTRAMUSCULAR | 0 refills | Status: DC

## 2019-09-23 NOTE — Telephone Encounter (Signed)
Last Visit: 08/06/2019 Next Visit: 10/06/2019 Labs: 09/22/2019 Glucose is elevated-166. Rest of CMP WNL. CBC WNL.   Okay to refill per Dr. Estanislado Pandy.

## 2019-09-23 NOTE — Telephone Encounter (Signed)
Last Visit: 08/06/2019 Next Visit: 10/06/2019 Labs: 09/22/2019 Glucose is elevated-166. Rest of CMP WNL. CBC WNL.  Eye exam: 11/15/2018   Spoke with [name] and sulfasalazine prescription has been received. Explained to David [name] that plaquenil was refilled for a 30 day supply because we had not received lab results when the refill was sent. Per David [name], they will be traveling and she requested a 60 day supply of plaquenil be sent to [name] to equal a 90 day.  She also requested a refill of methotrexate.   Okay to refill per Dr. Estanislado Pandy.   [name] called and they do not have sulfasalazine in stock. I called patient's wife to advise and see which pharmacy she would like prescription sent to. She will call a few local pharmacies and call back to advise.

## 2019-09-23 NOTE — Progress Notes (Signed)
Glucose is elevated-166.  Rest of CMP WNL.  CBC WNL.

## 2019-09-23 NOTE — Telephone Encounter (Signed)
David [name] called stating she went to [place] to pick up Kh[facility]'s prescriptions.  David [name] states they did not have the prescription for Sulfasalazine.  Patient was informed that the prescription was electronically sent to the pharmacy today at 2:37.  David [name] states she was there today at 2:45 and they were still waiting on the doctor's office to send in the prescription.    David [name] states she also picked up Hydroxychloroquine, but there was only 60 tablets.  David [name] states she needs 3 month supply due to going out of town.  She also states they will also need a refill of Methotrexate before they leave next week.  Edy only has 2 injections remaining.

## 2019-09-24 ENCOUNTER — Telehealth: Payer: Self-pay | Admitting: Rheumatology

## 2019-09-24 DIAGNOSIS — Z79899 Other long term (current) drug therapy: Secondary | ICD-10-CM

## 2019-09-24 MED ORDER — SULFASALAZINE 500 MG PO TBEC: 1000.0000 mg | DELAYED_RELEASE_TABLET | Freq: Two times a day (BID) | ORAL | 0 refills | Status: DC

## 2019-09-24 NOTE — Telephone Encounter (Signed)
Prescription resent to [facility] as requested.

## 2019-09-24 NOTE — Telephone Encounter (Signed)
David [name] called stating she needs the Sulfasalazine prescription sent to [facility] at [address] in [place].  David [name] states she thought the prescription was being transferred from [name], but [facility] is requesting new prescription before they will refill.  If you have any questions, please call back at [phone]

## 2019-09-25 ENCOUNTER — Other Ambulatory Visit (HOSPITAL_COMMUNITY)
Admission: RE | Admit: 2019-09-25 | Discharge: 2019-09-25 | Disposition: A | Discharge status: Home / Self Care | Payer: Medicare Other | Source: Ambulatory Visit | Attending: Internal Medicine | Admitting: Internal Medicine

## 2019-09-25 DIAGNOSIS — G2581 Restless legs syndrome: Secondary | ICD-10-CM | POA: Diagnosis not present

## 2019-09-25 DIAGNOSIS — N400 Enlarged prostate without lower urinary tract symptoms: Secondary | ICD-10-CM | POA: Diagnosis not present

## 2019-09-25 DIAGNOSIS — M1990 Unspecified osteoarthritis, unspecified site: Secondary | ICD-10-CM | POA: Diagnosis not present

## 2019-09-25 DIAGNOSIS — I10 Essential (primary) hypertension: Secondary | ICD-10-CM | POA: Diagnosis not present

## 2019-09-25 DIAGNOSIS — I700 Atherosclerosis of aorta: Secondary | ICD-10-CM | POA: Diagnosis not present

## 2019-09-25 DIAGNOSIS — Z20822 Contact with and (suspected) exposure to covid-19: Secondary | ICD-10-CM | POA: Insufficient documentation

## 2019-09-25 DIAGNOSIS — Z01812 Encounter for preprocedural laboratory examination: Secondary | ICD-10-CM | POA: Diagnosis not present

## 2019-09-25 DIAGNOSIS — F325 Major depressive disorder, single episode, in full remission: Secondary | ICD-10-CM | POA: Diagnosis not present

## 2019-09-25 DIAGNOSIS — M792 Neuralgia and neuritis, unspecified: Secondary | ICD-10-CM | POA: Diagnosis not present

## 2019-09-25 DIAGNOSIS — E782 Mixed hyperlipidemia: Secondary | ICD-10-CM | POA: Diagnosis not present

## 2019-09-25 LAB — SARS CORONAVIRUS 2 (TAT 6-24 HRS): SARS Coronavirus 2: NEGATIVE

## 2019-09-26 NOTE — Progress Notes (Signed)
Office Visit Note  Patient: David [name]             Date of Birth: May 27, 1950           MRN: [id number]             PCP: Orpah Melter, MD Referring: Orpah Melter, MD Visit Date: 10/06/2019 Occupation: @GUAROCC @  Subjective:  Right shoulder joint pain   History of Present Illness: David [name] is a 69 y.o. male with history of seronegative rheumatoid arthritis and osteoarthritis.  He is taking plaquenil 200 mg 1 tablet by mouth twice daily, MTX 15ml sq once weekly, folic acid 2 mg po daily, and Sulfasalazine 500 mg 2 tablets by mouth twice daily.  He denies missing any doses. He denies any recent rheumatoid arthritis flares.  He states he continues to have lancinating pain intermittently in bilateral upper extremities.  He states the pain is severe and causes muscle jerking and spasms.  He had a NCV with EMG, which revealed carpal tunnel syndrome bilaterally.  He states he has had surgical release bilaterally. He has tried taking robaxin as needed but it causes significant drowsiness.  He states his discomfort improves with prednisone.     Activities of Daily Living:  Patient reports morning stiffness for 2-3  hours.   Patient Reports nocturnal pain.  Difficulty dressing/grooming: Denies Difficulty climbing stairs: Denies Difficulty getting out of chair: Denies Difficulty using hands for taps, buttons, cutlery, and/or writing: Reports  Review of Systems  Constitutional: Positive for fatigue. Negative for night sweats.  HENT: Negative for mouth sores, mouth dryness and nose dryness.   Eyes: Negative for redness, itching and dryness.  Respiratory: Negative for shortness of breath and difficulty breathing.   Cardiovascular: Negative for chest pain, palpitations, hypertension, irregular heartbeat and swelling in legs/feet.  Gastrointestinal: Negative for blood in stool, constipation and diarrhea.  Endocrine: Negative for increased urination.  Genitourinary: Negative for  difficulty urinating and painful urination.  Musculoskeletal: Positive for arthralgias, joint pain, joint swelling, myalgias, muscle weakness, morning stiffness, muscle tenderness and myalgias.  Skin: Negative for color change, rash, hair loss, nodules/bumps, redness, skin tightness, ulcers and sensitivity to sunlight.  Allergic/Immunologic: Negative for susceptible to infections.  Neurological: Positive for memory loss. Negative for dizziness, fainting, numbness, headaches, night sweats and weakness.  Hematological: Negative for bruising/bleeding tendency and swollen glands.  Psychiatric/Behavioral: Positive for sleep disturbance. Negative for depressed mood and confusion. The patient is not nervous/anxious.     PMFS History:  Patient Active Problem List   Diagnosis Date Noted  . SBO (small bowel obstruction) ([place]) 08/21/2017  . Trochanteric bursitis of left hip 01/18/2017  . Lateral epicondylitis, right elbow 01/18/2017  . Rheumatoid arthritis ([place]) 03/15/2016  . DJD (degenerative joint disease), cervical 03/15/2016  . DDD (degenerative disc disease), lumbar 03/15/2016  . HTN (hypertension) 03/15/2016  . Obesity 03/15/2016  . Elevated cholesterol 03/15/2016  . Neuralgia 03/15/2016  . Malignant melanoma of right side of neck ([place]) 03/15/2016  . BBB (bundle branch block) 03/15/2016  . High risk medication use 03/15/2016  . Osteoarthritis of lumbar spine 03/15/2016  . Primary osteoarthritis of both hands 03/15/2016  . Primary osteoarthritis of both knees 03/15/2016  . Mesenteric adenitis 06/29/2015    Past Medical History:  Diagnosis Date  . Arthritis   . BBB (bundle branch block) 03/15/2016  . Carpal tunnel syndrome   . DDD (degenerative disc disease), lumbar 03/15/2016  . Depression   . Dysrhythmia    had  some tachycardia with steroids  . Elevated cholesterol 03/15/2016  . GERD (gastroesophageal reflux disease)    no meds since gastric banding  . Headache(784.0)    Ocular  Migraines - takes Ambien for it  . HTN (hypertension) 03/15/2016  . Hyperlipemia   . Hypertension   . Malignant melanoma of right side of neck ([place]) 03/15/2016   30 years ago   . Neuralgia 03/15/2016  . Rheumatoid arthritis ([place]) 03/15/2016   Sero Negative.   Marland Kitchen RLS (restless legs syndrome)   . Shortness of breath    DOE  . Sleep apnea    could not use a cpap  . Snores   . Status post gastric banding   . Wears glasses    driving    Family History  Problem Relation Age of Onset  . Heart disease Mother   . Alzheimer's disease Father    Past Surgical History:  Procedure Laterality Date  . CARPAL TUNNEL RELEASE Right 09/26/2012   Procedure: CARPAL TUNNEL RELEASE;  Surgeon: Tennis Must, MD;  Location: [facility];  Service: Orthopedics;  Laterality: Right;  . CARPAL TUNNEL RELEASE Left 10/24/2012   Procedure: CARPAL TUNNEL RELEASE;  Surgeon: Tennis Must, MD;  Location: [facility];  Service: Orthopedics;  Laterality: Left;  . EXCISION MELANOMA WITH SENTINEL LYMPH NODE BIOPSY  1984   rt neck with mulpipal nodes and some muscle excised rt neck-shoulder  . KNEE ARTHROPLASTY    . knee replacement    . LAPAROSCOPIC GASTRIC BANDING  03/15/2009  . LAPAROSCOPIC GASTRIC BANDING    . MUSCLE BIOPSY  2004   rt thigh to r/o myocitis  . nerve conduction velosity test    . TONSILLECTOMY    . TOTAL KNEE ARTHROPLASTY Bilateral 12/05/2010  . TRIGGER FINGER RELEASE Right 09/26/2012   Procedure: RELEASE TRIGGER FINGER/A-1 PULLEY RING AND SMALL ;  Surgeon: Tennis Must, MD;  Location: [place] [facility];  Service: Orthopedics;  Laterality: Right;  . TRIGGER FINGER RELEASE Left 10/24/2012   Procedure: RELEASE TRIGGER FINGER/A-1 PULLEY LEFT MIDDLE AND LEFT RING;  Surgeon: Tennis Must, MD;  Location: [facility];  Service: Orthopedics;  Laterality: Left;   Social History   Social History Narrative  . Not on file    There is no immunization history  on file for this patient.   Objective: Vital Signs: BP 131/77 (BP Location: Left Arm, Patient Position: Sitting, Cuff Size: Normal)   Pulse 87   Resp 20   Ht 5\' 8"  (1.727 m)   Wt 264 lb 6.4 oz (119.9 kg)   BMI 40.20 kg/m    Physical Exam Vitals and nursing note reviewed.  Constitutional:      Appearance: He is well-developed.  HENT:     Head: Normocephalic and atraumatic.  Eyes:     Conjunctiva/sclera: Conjunctivae normal.     Pupils: Pupils are equal, round, and reactive to light.  Pulmonary:     Effort: Pulmonary effort is normal.  Abdominal:     General: Bowel sounds are normal.     Palpations: Abdomen is soft.  Musculoskeletal:     Cervical back: Normal range of motion and neck supple.  Skin:    General: Skin is warm and dry.     Capillary Refill: Capillary refill takes less than 2 seconds.  Neurological:     Mental Status: He is alert and oriented to person, place, and time.  Psychiatric:  Behavior: Behavior normal.      Musculoskeletal Exam: C-spine good ROM bilaterally.  Thoracic kyphosis. Painful and limited ROM of both shoulder joints. Tenderness over bilateral AC joints and subacromial bursa. Elbow joints good ROM with no tenderness of synovitis.  PIP and DIP thickening consistent with osteoarthritis. Complete fist formation bilaterally.  Bilateral knee replacements has good ROM with no warmth or effusion.  Ankle joints good ROM with no tenderness or inflammation.   CDAI Exam: CDAI Score: -- Patient Global: --; Provider Global: -- Swollen: --; Tender: -- Joint Exam 10/06/2019   No joint exam has been documented for this visit   There is currently no information documented on the homunculus. Go to the Rheumatology activity and complete the homunculus joint exam.  Investigation: No additional findings.  Imaging: SLEEP STUDY DOCUMENTS  Result Date: 10/01/2019 Ordered by an unspecified provider.   Recent Labs: Lab Results  Component Value Date    WBC 6.8 09/22/2019   HGB 15.4 09/22/2019   PLT 285 09/22/2019   NA 140 09/22/2019   K 4.7 09/22/2019   CL 102 09/22/2019   CO2 32 09/22/2019   GLUCOSE 166 (H) 09/22/2019   BUN 18 09/22/2019   CREATININE 1.06 09/22/2019   BILITOT 0.4 09/22/2019   ALKPHOS 72 09/14/2018   AST 31 09/22/2019   ALT 25 09/22/2019   PROT 6.4 09/22/2019   ALBUMIN 3.8 09/14/2018   CALCIUM 9.8 09/22/2019   GFRAA 84 09/22/2019    Speciality Comments: PLQ Eye Exam: 11/15/18 WNL @ [facility]   Procedures:  Large Joint Inj: R subacromial bursa on 10/06/2019 9:14 AM Indications: pain Details: 27 G 1.5 in needle, posterior approach  Arthrogram: No  Medications: 1.5 mL lidocaine 1 %; 40 mg triamcinolone acetonide 40 MG/ML Aspirate: 0 mL Outcome: tolerated well, no immediate complications Procedure, treatment alternatives, risks and benefits explained, specific risks discussed. Consent was given by the patient. Immediately prior to procedure a time out was called to verify the correct patient, procedure, equipment, support staff and site/side marked as required. Patient was prepped and draped in the usual sterile fashion.     Allergies: Codeine, Dilaudid [hydromorphone hcl], Fentanyl, Nucynta [tapentadol], and Penicillins   Assessment / Plan:     Visit Diagnoses: Rheumatoid arthritis of multiple sites with negative rheumatoid factor ([place]): He has no synovitis on exam today.  He has been experiencing severe pain in both shoulder joints.  X-rays of both shoulders were obtained on 12/04/2018 which were consistent with [facility] joint arthritis.  He has painful limited range of motion of both shoulders on exam.  A right subacromial bursa cortisone injection was performed today.  He tolerated the procedure well.  He has been experiencing lancinating pain in bilateral upper extremities.  He was evaluated by Dr. Jannifer Franklin and had a thorough work-up performed.  NCV with EMG was consistent with carpal tunnel syndrome bilaterally.   He was advised to schedule an appointment with Dr. Ninfa Linden for further evaluation and treatment.  Her discomfort has been a 10 out of 10 and has had difficulty sleeping due to the pain.  He has an upcoming vacation with his family and is unsure if he will be able to go due to the severity of his pain.  We discussed scheduling appointment with Dr. Ronnald Ramp his dermatologist to discuss clearance for starting on a biologic medication in the future.  We will send in a prednisone taper starting at 20 mg tapering by 5 mg every week.  We will  also check a sed rate today.  In the meantime he will continue taking sulfasalazine 500 mg 2 tablets twice daily, Plaquenil 200 mg 1 tablet twice daily, and methotrexate 1 mL subcu injections once weekly.  He will follow-up in the office in 3 months.- Plan: Sedimentation rate  High risk medication use - Methotrexate 1 mL every 7 days, folic acid 1 mg 2 tablets daily, sulfasalazine 500 mg 2 tablets twice daily, and Plaquenil 200 mg 1 tablet twice daily.  He has a history of melanoma and has not taken any biologic medications in the past.  We discussed scheduling appointment Dr. Ronnald Ramp to discuss starting on a biologic to better control control his rheumatoid arthritis.  CBC and CMP were drawn on 09/22/2019.  He will return for lab work in August and every 3 months.  Primary osteoarthritis of both hands: He has PIP and DIP thickening consistent with osteoarthritis of both hands.  No tenderness or synovitis was noted.  He has complete fist formation bilaterally.  Arthritis of both acromioclavicular joints: X-rays of both shoulder joints were obtained on 12/04/2018 which were consistent with acromioclavicular joint arthritis.  He has painful limited range of motion of the right shoulder on exam.  He requested a cortisone injection today.  He tolerated the procedure well.  The procedure note was completed above.  He will be given a prednisone taper starting at 20 mg tapering by 5 mg every  week.  Hx of total knee replacement, bilateral: Doing well.  He has good range of motion with no discomfort.  No warmth or effusion was noted.  DDD (degenerative disc disease), cervical: He has good range of motion with no discomfort at this time.  DDD (degenerative disc disease), lumbar: He is not having any lower back pain currently.  He has no symptoms of radiculopathy.  History of melanoma: He sees Dr. Ronnald Ramp on a yearly basis.  He was advised to schedule a follow-up visit to discuss starting on a biologic medication for management of his rheumatoid arthritis.  Neuralgia -He has been experiencing severe intermittent neuralgias and muscle spasms.  He was evaluated with Dr. Jannifer Franklin and underwent a thorough work-up.  NCV with EMG was consistent with carpal tunnel syndrome bilaterally.  He has been taking gabapentin as prescribed.  He tried taking Robaxin but experiences severe drowsiness.  He declined a second opinion referral to a neurologist.  Plan: Sedimentation rate  Muscle spasm - He continues to have lancinating pain and muscle spasms intermittently in bilateral upper extremities.  He was evaluated by Dr. Jannifer Franklin and underwent a thorough work-up.  He discomfort is 10 out of 10 intermittently.  His neuralgias and myalgias are responsive to prednisone.  We discussed a second referral to neurologist but he declined at this time.  He had a NCV with EMG which was consistent with carpal tunnel syndrome bilaterally.  He has had carpal tunnel release bilaterally in the past.  He was advised to schedule appointment Dr. Ninfa Linden for further evaluation and treatment.  He will be starting on a prednisone taper starting at 20 mg tapering by 5 mg once a week.  We will check a sed rate today.- Plan: Sedimentation rate  Orders: Orders Placed This Encounter  Procedures  . Large Joint Inj  . Sedimentation rate   Meds ordered this encounter  Medications  . predniSONE (DELTASONE) 5 MG tablet    Sig: Take 4  tablets by mouth daily x1 week, 3 tablets by mouth daily x1 week, 2  tablets by mouth daily x1 week, 1 tablet by mouth daily x1 week.    Dispense:  70 tablet    Refill:  0    Face-to-face time spent with patient was 30 minutes. Greater than 50% of time was spent in counseling and coordination of care.  Follow-Up Instructions: Return in about 3 months (around 01/06/2020) for Rheumatoid arthritis.   Ofilia Neas, PA-C  Note - This record has been created using Dragon software.  Chart creation errors have been sought, but may not always  have been located. Such creation errors do not reflect on  the standard of medical care.

## 2019-09-27 ENCOUNTER — Other Ambulatory Visit: Payer: Self-pay

## 2019-09-27 ENCOUNTER — Ambulatory Visit (HOSPITAL_BASED_OUTPATIENT_CLINIC_OR_DEPARTMENT_OTHER): Payer: Medicare Other | Attending: Internal Medicine | Admitting: Internal Medicine

## 2019-09-27 VITALS — Ht 68.0 in | Wt 256.0 lb

## 2019-09-29 ENCOUNTER — Other Ambulatory Visit (HOSPITAL_BASED_OUTPATIENT_CLINIC_OR_DEPARTMENT_OTHER): Payer: Self-pay

## 2019-09-29 DIAGNOSIS — G4752 REM sleep behavior disorder: Secondary | ICD-10-CM

## 2019-09-29 DIAGNOSIS — G4719 Other hypersomnia: Secondary | ICD-10-CM

## 2019-09-29 DIAGNOSIS — R0681 Apnea, not elsewhere classified: Secondary | ICD-10-CM

## 2019-09-29 DIAGNOSIS — R0683 Snoring: Secondary | ICD-10-CM

## 2019-10-05 NOTE — Procedures (Signed)
   NAME: David [name] DATE OF BIRTH:  1951-02-06 MEDICAL RECORD NUMBER [id number]  LOCATION: [facility]  PHYSICIAN: Marius Ditch  DATE OF STUDY: 09/27/2019  SLEEP STUDY TYPE: Positive Airway Pressure Titration               REFERRING PHYSICIAN: Marius Ditch, MD  INDICATION FOR STUDY: h/o OSA, disrupted sleep, witnessed apnea, excessive daytime sleepiness  EPWORTH SLEEPINESS SCORE:   HEIGHT: 5\' 8"  (172.7 cm)  WEIGHT: 256 lb (116.1 kg)    Body mass index is 38.92 kg/m.  NECK SIZE: 16 in.  MEDICATIONS Patient self administered medications include: N/A. Medications administered during study include Sleep medicine administered - melatonin 5 mg at 10:14:24 PM.  SLEEP STUDY TECHNIQUE A multi-channel overnight Polysomnography study was performed. The channels recorded and monitored were central and occipital EEG, electrooculogram (EOG), submentalis EMG (chin), nasal and oral airflow, thoracic and abdominal wall motion, anterior tibialis EMG, snore microphone, electrocardiogram, and a pulse oximetry.  TECHNICAL COMMENTS Comments added by Technician: Patient had difficulty initiating sleep. Comments added by Scorer: N/A  SLEEP ARCHITECTURE The study was initiated at 10:18:33 PM and terminated at 12:33:05 AM. The total recorded time was 134.5 minutes. EEG confirmed total sleep time was 42 minutes yielding a sleep efficiency of 31.2%%. Sleep onset after lights out was 12.4 minutes with a REM latency of N/A minutes. The patient spent 46.4%% of the night in stage N1 sleep, 53.6%% in stage N2 sleep, 0.0%% in stage N3 and 0% in REM. Wake after sleep onset (WASO) was 80.1 minutes. The Arousal Index was 51.4/hour.  RESPIRATORY PARAMETERS There were a total of 3 respiratory disturbances out of which 0 were apneas ( 0 obstructive, 0 mixed, 0 central) and 3 hypopneas. The apnea/hypopnea index (AHI) was 4.3 events/hour. The central sleep apnea index was 0.0 events/hour. The  supine AHI was 2.7 events/hour and the non supine AHI was 6.2 events/hour. Respiratory disturbance index was 27 events/hour.  Respiratory disturbances were associated with oxygen desaturation down to a nadir of 85.0% during sleep. The mean oxygen saturation during the study was 89.5%. The cumulative time under 88% oxygen saturation was 6.4 minutes (about 15% of measured time).  LEG MOVEMENT DATA The total leg movements were 0 with a resulting leg movement index of 0.0/hr . Associated arousal with leg movement index was 0.0/hr. CARDIAC DATA The underlying cardiac rhythm was most consistent with sinus rhythm. Mean heart rate during sleep was 77.2 bpm. Additional rhythm abnormalities include None.  IMPRESSIONS - Inadequate sleep test with only 42 minutes of sleep noted - No Significant Obstructive Sleep apnea(OSA) by AHI; moderate OSA by RDI - Moderate Oxygen Desaturation  DIAGNOSIS - Obstructive sleep apnea, by history and by RDI  RECOMMENDATIONS - This test was inadequate. It was discontinued due to patient discomfort. Although there is concern about RBD, a HSAT might be adequate to (re)confirm his past diagnosis of OSA and allow treatment with further testing for RBD if dream enactment continues.   Marius Ditch Sleep specialist, [place] Board of [employer]  ELECTRONICALLY SIGNED ON:  10/05/2019, 8:25 PM [place] PH: [phone]   FX: [phone] [place]

## 2019-10-06 ENCOUNTER — Other Ambulatory Visit: Payer: Self-pay

## 2019-10-06 ENCOUNTER — Ambulatory Visit (INDEPENDENT_AMBULATORY_CARE_PROVIDER_SITE_OTHER): Payer: Medicare Other | Admitting: Physician Assistant

## 2019-10-06 ENCOUNTER — Encounter: Payer: Self-pay | Admitting: Physician Assistant

## 2019-10-06 VITALS — BP 131/77 | HR 87 | Resp 20 | Ht 68.0 in | Wt 264.4 lb

## 2019-10-06 DIAGNOSIS — Z85820 Personal history of malignant melanoma of skin: Secondary | ICD-10-CM

## 2019-10-06 DIAGNOSIS — M62838 Other muscle spasm: Secondary | ICD-10-CM

## 2019-10-06 DIAGNOSIS — M19012 Primary osteoarthritis, left shoulder: Secondary | ICD-10-CM

## 2019-10-06 DIAGNOSIS — M5030 Other cervical disc degeneration, unspecified cervical region: Secondary | ICD-10-CM | POA: Diagnosis not present

## 2019-10-06 DIAGNOSIS — M19011 Primary osteoarthritis, right shoulder: Secondary | ICD-10-CM | POA: Diagnosis not present

## 2019-10-06 DIAGNOSIS — M0609 Rheumatoid arthritis without rheumatoid factor, multiple sites: Secondary | ICD-10-CM

## 2019-10-06 DIAGNOSIS — Z8639 Personal history of other endocrine, nutritional and metabolic disease: Secondary | ICD-10-CM | POA: Diagnosis not present

## 2019-10-06 DIAGNOSIS — M5136 Other intervertebral disc degeneration, lumbar region: Secondary | ICD-10-CM | POA: Diagnosis not present

## 2019-10-06 DIAGNOSIS — M7551 Bursitis of right shoulder: Secondary | ICD-10-CM | POA: Diagnosis not present

## 2019-10-06 DIAGNOSIS — M19041 Primary osteoarthritis, right hand: Secondary | ICD-10-CM | POA: Diagnosis not present

## 2019-10-06 DIAGNOSIS — M792 Neuralgia and neuritis, unspecified: Secondary | ICD-10-CM

## 2019-10-06 DIAGNOSIS — Z96653 Presence of artificial knee joint, bilateral: Secondary | ICD-10-CM

## 2019-10-06 DIAGNOSIS — Z79899 Other long term (current) drug therapy: Secondary | ICD-10-CM

## 2019-10-06 DIAGNOSIS — M19042 Primary osteoarthritis, left hand: Secondary | ICD-10-CM

## 2019-10-06 LAB — SEDIMENTATION RATE: Sed Rate: 6 mm/h (ref 0–20)

## 2019-10-06 MED ORDER — PREDNISONE 5 MG PO TABS
ORAL_TABLET | ORAL | 0 refills | Status: DC
Start: 2019-10-06 — End: 2020-01-06

## 2019-10-07 NOTE — Progress Notes (Signed)
ESR is WNL-6.

## 2019-10-30 ENCOUNTER — Encounter: Payer: Self-pay | Admitting: Orthopaedic Surgery

## 2019-10-30 ENCOUNTER — Ambulatory Visit (INDEPENDENT_AMBULATORY_CARE_PROVIDER_SITE_OTHER): Payer: Medicare Other | Admitting: Orthopaedic Surgery

## 2019-10-30 DIAGNOSIS — M79601 Pain in right arm: Secondary | ICD-10-CM | POA: Diagnosis not present

## 2019-10-30 DIAGNOSIS — M79602 Pain in left arm: Secondary | ICD-10-CM | POA: Diagnosis not present

## 2019-10-30 DIAGNOSIS — R200 Anesthesia of skin: Secondary | ICD-10-CM

## 2019-10-30 MED ORDER — GABAPENTIN 300 MG PO CAPS: 900.0000 mg | ORAL_CAPSULE | Freq: Three times a day (TID) | ORAL | 3 refills | Status: DC

## 2019-10-30 NOTE — Progress Notes (Signed)
Office Visit Note   Patient: David [name]           Date of Birth: 1950-06-01           MRN: [id number] Visit Date: 10/30/2019              Requested by: Orpah Melter, MD [address] [place],  [place] [address] PCP: Orpah Melter, MD   Assessment & Plan: Visit Diagnoses:  1. Bilateral arm pain   2. Bilateral hand numbness     Plan: Given his persistent pain with numbness and tingling I would like to increase his Neurontin to 3 times a day.  I would also like to obtain an MRI of the cervical spine and a MRI of the right shoulder given the architecture of his right shoulder from all the muscles being removed from the malignant melanoma.  We need to get a better understanding of what the glenohumeral joint looks like and for evidence of impingement given the fact that a steroid shot did help some in the right shoulder.  The MRI of the cervical spine is warranted given the persistent numbness and tingling going down both arms.  He agrees with this treatment plan.  All questions and concerns were answered and addressed.  Follow-Up Instructions: Return in about 3 weeks (around 11/20/2019).   Orders:  No orders of the defined types were placed in this encounter.  No orders of the defined types were placed in this encounter.     Procedures: No procedures performed   Clinical Data: No additional findings.   Subjective: Chief Complaint  Patient presents with  . Left Hand - Pain  . Right Hand - Pain  The patient comes in today with a complex history.  He has a remote history of bilateral carpal tunnel surgery done at different times.  He gets shooting pain from his neck down to his hands on both sides.  This is become a chronic situation for him.  He also has remote surgeries right shoulder due to a malignant melanoma and had to remove a lot of muscles from around the shoulder itself on the right side.  He is recently had a steroid injection in the right shoulder by his  rheumatologist.  He has been on prednisone as well and that helped temporize some of his pain and symptoms.  He still has persistent numbness and pain going down both arms.  Recent nerve conduction study showed borderline carpal tunnel syndrome on the right side and mild carpal tunnel syndrome on the left side.  He is on 900 mg of Neurontin twice a day.  He is on other medications as relates to rheumatologic disease.  HPI  Review of Systems He currently denies any headache, chest pain, shortness of breath, fever, chills, nausea, vomiting  Objective: Vital Signs: There were no vitals taken for this visit.  Physical Exam He is alert and orient x3 and in no acute distress Ortho Exam Examination of his right shoulder shows limited shoulder abduction and obvious deformity of the shoulder due to previous surgery on the muscles from his malignant melanoma.  He has 5 out of 5 strength of his biceps, triceps, wrist and hand.  He has excellent grip strength in both hands.  There is no atrophy in the muscles of the hands.  He does have a positive Spurling sign to the right and left sides when I examined his neck. Specialty Comments:  No specialty comments available.  Imaging:  No results found.   PMFS History: Patient Active Problem List   Diagnosis Date Noted  . SBO (small bowel obstruction) ([place]) 08/21/2017  . Trochanteric bursitis of left hip 01/18/2017  . Lateral epicondylitis, right elbow 01/18/2017  . Rheumatoid arthritis ([place]) 03/15/2016  . DJD (degenerative joint disease), cervical 03/15/2016  . DDD (degenerative disc disease), lumbar 03/15/2016  . HTN (hypertension) 03/15/2016  . Obesity 03/15/2016  . Elevated cholesterol 03/15/2016  . Neuralgia 03/15/2016  . Malignant melanoma of right side of neck ([place]) 03/15/2016  . BBB (bundle branch block) 03/15/2016  . High risk medication use 03/15/2016  . Osteoarthritis of lumbar spine 03/15/2016  . Primary osteoarthritis of both hands  03/15/2016  . Primary osteoarthritis of both knees 03/15/2016  . Mesenteric adenitis 06/29/2015   Past Medical History:  Diagnosis Date  . Arthritis   . BBB (bundle branch block) 03/15/2016  . Carpal tunnel syndrome   . DDD (degenerative disc disease), lumbar 03/15/2016  . Depression   . Dysrhythmia    had some tachycardia with steroids  . Elevated cholesterol 03/15/2016  . GERD (gastroesophageal reflux disease)    no meds since gastric banding  . Headache(784.0)    Ocular Migraines - takes Ambien for it  . HTN (hypertension) 03/15/2016  . Hyperlipemia   . Hypertension   . Malignant melanoma of right side of neck ([place]) 03/15/2016   30 years ago   . Neuralgia 03/15/2016  . Rheumatoid arthritis ([place]) 03/15/2016   Sero Negative.   Marland Kitchen RLS (restless legs syndrome)   . Shortness of breath    DOE  . Sleep apnea    could not use a cpap  . Snores   . Status post gastric banding   . Wears glasses    driving    Family History  Problem Relation Age of Onset  . Heart disease Mother   . Alzheimer's disease Father     Past Surgical History:  Procedure Laterality Date  . CARPAL TUNNEL RELEASE Right 09/26/2012   Procedure: CARPAL TUNNEL RELEASE;  Surgeon: Tennis Must, MD;  Location: [facility];  Service: Orthopedics;  Laterality: Right;  . CARPAL TUNNEL RELEASE Left 10/24/2012   Procedure: CARPAL TUNNEL RELEASE;  Surgeon: Tennis Must, MD;  Location: [facility];  Service: Orthopedics;  Laterality: Left;  . EXCISION MELANOMA WITH SENTINEL LYMPH NODE BIOPSY  1984   rt neck with mulpipal nodes and some muscle excised rt neck-shoulder  . KNEE ARTHROPLASTY    . knee replacement    . LAPAROSCOPIC GASTRIC BANDING  03/15/2009  . LAPAROSCOPIC GASTRIC BANDING    . MUSCLE BIOPSY  2004   rt thigh to r/o myocitis  . nerve conduction velosity test    . TONSILLECTOMY    . TOTAL KNEE ARTHROPLASTY Bilateral 12/05/2010  . TRIGGER FINGER RELEASE Right 09/26/2012    Procedure: RELEASE TRIGGER FINGER/A-1 PULLEY RING AND SMALL ;  Surgeon: Tennis Must, MD;  Location: [facility];  Service: Orthopedics;  Laterality: Right;  . TRIGGER FINGER RELEASE Left 10/24/2012   Procedure: RELEASE TRIGGER FINGER/A-1 PULLEY LEFT MIDDLE AND LEFT RING;  Surgeon: Tennis Must, MD;  Location: [facility];  Service: Orthopedics;  Laterality: Left;   Social History   Occupational History  . Not on file  Tobacco Use  . Smoking status: Former Smoker    Packs/day: 1.00    Years: 30.00    Pack years: 30.00    Types: Cigarettes  Quit date: 09/23/2001    Years since quitting: 18.1  . Smokeless tobacco: Never Used  Vaping Use  . Vaping Use: Never used  Substance and Sexual Activity  . Alcohol use: Yes    Comment: rare  . Drug use: No  . Sexual activity: Not on file

## 2019-10-31 ENCOUNTER — Other Ambulatory Visit: Payer: Self-pay

## 2019-10-31 DIAGNOSIS — M4807 Spinal stenosis, lumbosacral region: Secondary | ICD-10-CM

## 2019-10-31 DIAGNOSIS — M542 Cervicalgia: Secondary | ICD-10-CM

## 2019-10-31 DIAGNOSIS — M79601 Pain in right arm: Secondary | ICD-10-CM

## 2019-11-20 ENCOUNTER — Ambulatory Visit: Payer: Medicare Other | Admitting: Orthopaedic Surgery

## 2019-11-23 ENCOUNTER — Other Ambulatory Visit: Payer: Self-pay | Admitting: Rheumatology

## 2019-11-23 DIAGNOSIS — Z79899 Other long term (current) drug therapy: Secondary | ICD-10-CM

## 2019-11-23 DIAGNOSIS — M0609 Rheumatoid arthritis without rheumatoid factor, multiple sites: Secondary | ICD-10-CM

## 2019-11-25 NOTE — Telephone Encounter (Signed)
Ok to refill 30 day supply of PLQ.

## 2019-11-25 NOTE — Telephone Encounter (Signed)
Last Visit: 10/06/2019 Next Visit: 01/06/2020 Labs: 09/22/2019 Glucose is elevated-166. Rest of CMP WNL. CBC WNL.  PLQ Eye Exam: 11/15/18 WNL   Current Dose per office note on 10/06/2019: Plaquenil 200 mg 1 tablet twice daily.    Left message to advise patient he is due to update PLQ eye exam.  Okay to refill PLQ?

## 2019-12-04 ENCOUNTER — Ambulatory Visit
Admission: RE | Admit: 2019-12-04 | Discharge: 2019-12-04 | Disposition: A | Discharge status: Home / Self Care | Payer: Medicare Other | Source: Ambulatory Visit | Attending: Orthopaedic Surgery | Admitting: Orthopaedic Surgery

## 2019-12-04 ENCOUNTER — Other Ambulatory Visit: Payer: Self-pay

## 2019-12-04 DIAGNOSIS — M542 Cervicalgia: Secondary | ICD-10-CM

## 2019-12-04 DIAGNOSIS — M79601 Pain in right arm: Secondary | ICD-10-CM

## 2019-12-04 DIAGNOSIS — M4807 Spinal stenosis, lumbosacral region: Secondary | ICD-10-CM

## 2019-12-04 DIAGNOSIS — M4802 Spinal stenosis, cervical region: Secondary | ICD-10-CM | POA: Diagnosis not present

## 2019-12-04 DIAGNOSIS — M19011 Primary osteoarthritis, right shoulder: Secondary | ICD-10-CM | POA: Diagnosis not present

## 2019-12-04 DIAGNOSIS — M75111 Incomplete rotator cuff tear or rupture of right shoulder, not specified as traumatic: Secondary | ICD-10-CM | POA: Diagnosis not present

## 2019-12-08 ENCOUNTER — Encounter: Payer: Self-pay | Admitting: Orthopaedic Surgery

## 2019-12-08 ENCOUNTER — Other Ambulatory Visit: Payer: Self-pay

## 2019-12-08 ENCOUNTER — Ambulatory Visit (INDEPENDENT_AMBULATORY_CARE_PROVIDER_SITE_OTHER): Payer: Medicare Other | Admitting: Orthopaedic Surgery

## 2019-12-08 VITALS — Ht 68.0 in | Wt 250.0 lb

## 2019-12-08 DIAGNOSIS — G8929 Other chronic pain: Secondary | ICD-10-CM | POA: Diagnosis not present

## 2019-12-08 DIAGNOSIS — M25511 Pain in right shoulder: Secondary | ICD-10-CM | POA: Diagnosis not present

## 2019-12-08 DIAGNOSIS — M542 Cervicalgia: Secondary | ICD-10-CM

## 2019-12-08 DIAGNOSIS — M79601 Pain in right arm: Secondary | ICD-10-CM

## 2019-12-08 MED ORDER — LIDOCAINE HCL 1 % IJ SOLN
3.0000 mL | INTRAMUSCULAR | Status: AC | PRN
  Administered 2019-12-08: 3 mL

## 2019-12-08 MED ORDER — METHYLPREDNISOLONE ACETATE 40 MG/ML IJ SUSP
40.0000 mg | INTRAMUSCULAR | Status: AC | PRN
  Administered 2019-12-08: 40 mg via INTRA_ARTICULAR

## 2019-12-08 NOTE — Progress Notes (Signed)
Office Visit Note   Patient: David [name]           Date of Birth: 1950/09/29           MRN: [id number] Visit Date: 12/08/2019              Requested by: Orpah Melter, MD [address] [place],  [place] [address] PCP: Orpah Melter, MD   Assessment & Plan: Visit Diagnoses:  1. Chronic right shoulder pain   2. Neck pain   3. Right arm pain     Plan: I did go over the MRIs with the patient in the office.  I recommended a steroid injection in his right shoulder subacromial space today.  I would like to get him into therapy for his right shoulder.  They can work on the modalities to increase his motion and strength of the shoulder and decrease his pain.  I would also like to send him to neurosurgery for evaluation of his cervical spine given the severe stenosis that is being seen centrally and foraminal he at C5-C6.  All questions and concerns were answered and addressed.  I will see him back myself in 4 weeks to see how he is doing overall from a shoulder standpoint.  Follow-Up Instructions: Return in about 4 weeks (around 01/05/2020).   Orders:  Orders Placed This Encounter  Procedures  . Large Joint Inj   No orders of the defined types were placed in this encounter.     Procedures: Large Joint Inj: R subacromial bursa on 12/08/2019 10:40 AM Indications: pain and diagnostic evaluation Details: 22 G 1.5 in needle  Arthrogram: No  Medications: 3 mL lidocaine 1 %; 40 mg methylPREDNISolone acetate 40 MG/ML Outcome: tolerated well, no immediate complications Procedure, treatment alternatives, risks and benefits explained, specific risks discussed. Consent was given by the patient. Immediately prior to procedure a time out was called to verify the correct patient, procedure, equipment, support staff and site/side marked as required. Patient was prepped and draped in the usual sterile fashion.       Clinical Data: No additional findings.   Subjective: Chief  Complaint  Patient presents with  . Neck - Follow-up, Pain    MRI Cervical Spine review  . Right Shoulder - Follow-up, Pain    MRI Right Shoulder review  The patient comes in today for MRI reviews of his right shoulder as well as the cervical spine.  Since we increased his Neurontin his symptoms have decreased somewhat.  He is not waking up in the middle night with the severe pain that he was having.  He still reporting weakness in both arms with occasional burning pain that goes down both arms.  He has had a remote history of melanoma surgery to his right shoulder where they had remove a lot of muscles from the shoulder.  His biggest complaint with his right shoulder is just weakness with overhead activities.  He still complains of significant pain radiating down both arms.  HPI  Review of Systems He currently denies any headache, chest pain, shortness of breath, fever, chills, nausea, vomiting  Objective: Vital Signs: Ht 5\' 8"  (1.727 m)   Wt 250 lb (113.4 kg)   BMI 38.01 kg/m   Physical Exam He is alert and orient x3 with no acute distress Ortho Exam Examination of both upper extremities shows significant weakness.  Most of the weakness is more so in the right shoulder itself but overall he does have radicular  symptoms affecting both upper extremities. Specialty Comments:  No specialty comments available.  Imaging: No results found. The MRI of the shoulder on the right side shows severe tendinosis of the rotator cuff.  There is a type II acromion and moderate arthritis of the AC joint.  The rotator cuff itself is intact and there is no muscle atrophy of the rotator cuff muscles.  MRI of the cervical spine shows severe multifactorial stenosis centrally and bilateral foramina at see 5 C6 as well as C2-3 in terms of foraminal stenosis to the right.  PMFS History: Patient Active Problem List   Diagnosis Date Noted  . SBO (small bowel obstruction) ([place]) 08/21/2017  . Trochanteric  bursitis of left hip 01/18/2017  . Lateral epicondylitis, right elbow 01/18/2017  . Rheumatoid arthritis ([place]) 03/15/2016  . DJD (degenerative joint disease), cervical 03/15/2016  . DDD (degenerative disc disease), lumbar 03/15/2016  . HTN (hypertension) 03/15/2016  . Obesity 03/15/2016  . Elevated cholesterol 03/15/2016  . Neuralgia 03/15/2016  . Malignant melanoma of right side of neck ([facility]) 03/15/2016  . BBB (bundle branch block) 03/15/2016  . High risk medication use 03/15/2016  . Osteoarthritis of lumbar spine 03/15/2016  . Primary osteoarthritis of both hands 03/15/2016  . Primary osteoarthritis of both knees 03/15/2016  . Mesenteric adenitis 06/29/2015   Past Medical History:  Diagnosis Date  . Arthritis   . BBB (bundle branch block) 03/15/2016  . Carpal tunnel syndrome   . DDD (degenerative disc disease), lumbar 03/15/2016  . Depression   . Dysrhythmia    had some tachycardia with steroids  . Elevated cholesterol 03/15/2016  . GERD (gastroesophageal reflux disease)    no meds since gastric banding  . Headache(784.0)    Ocular Migraines - takes Ambien for it  . HTN (hypertension) 03/15/2016  . Hyperlipemia   . Hypertension   . Malignant melanoma of right side of neck ([place]) 03/15/2016   30 years ago   . Neuralgia 03/15/2016  . Rheumatoid arthritis ([place]) 03/15/2016   Sero Negative.   Marland Kitchen RLS (restless legs syndrome)   . Shortness of breath    DOE  . Sleep apnea    could not use a cpap  . Snores   . Status post gastric banding   . Wears glasses    driving    Family History  Problem Relation Age of Onset  . Heart disease Mother   . Alzheimer's disease Father     Past Surgical History:  Procedure Laterality Date  . CARPAL TUNNEL RELEASE Right 09/26/2012   Procedure: CARPAL TUNNEL RELEASE;  Surgeon: Tennis Must, MD;  Location: [facility];  Service: Orthopedics;  Laterality: Right;  . CARPAL TUNNEL RELEASE Left 10/24/2012   Procedure: CARPAL  TUNNEL RELEASE;  Surgeon: Tennis Must, MD;  Location: [facility];  Service: Orthopedics;  Laterality: Left;  . EXCISION MELANOMA WITH SENTINEL LYMPH NODE BIOPSY  1984   rt neck with mulpipal nodes and some muscle excised rt neck-shoulder  . KNEE ARTHROPLASTY    . knee replacement    . LAPAROSCOPIC GASTRIC BANDING  03/15/2009  . LAPAROSCOPIC GASTRIC BANDING    . MUSCLE BIOPSY  2004   rt thigh to r/o myocitis  . nerve conduction velosity test    . TONSILLECTOMY    . TOTAL KNEE ARTHROPLASTY Bilateral 12/05/2010  . TRIGGER FINGER RELEASE Right 09/26/2012   Procedure: RELEASE TRIGGER FINGER/A-1 PULLEY RING AND SMALL ;  Surgeon: Tennis Must, MD;  Location: [facility];  Service: Orthopedics;  Laterality: Right;  . TRIGGER FINGER RELEASE Left 10/24/2012   Procedure: RELEASE TRIGGER FINGER/A-1 PULLEY LEFT MIDDLE AND LEFT RING;  Surgeon: Tennis Must, MD;  Location: [facility];  Service: Orthopedics;  Laterality: Left;   Social History   Occupational History  . Not on file  Tobacco Use  . Smoking status: Former Smoker    Packs/day: 1.00    Years: 30.00    Pack years: 30.00    Types: Cigarettes    Quit date: 09/23/2001    Years since quitting: 18.2  . Smokeless tobacco: Never Used  Vaping Use  . Vaping Use: Never used  Substance and Sexual Activity  . Alcohol use: Yes    Comment: rare  . Drug use: No  . Sexual activity: Not on file

## 2019-12-09 ENCOUNTER — Other Ambulatory Visit: Payer: Self-pay | Admitting: Radiology

## 2019-12-09 DIAGNOSIS — M542 Cervicalgia: Secondary | ICD-10-CM

## 2019-12-09 DIAGNOSIS — G8929 Other chronic pain: Secondary | ICD-10-CM

## 2019-12-12 ENCOUNTER — Telehealth: Payer: Self-pay | Admitting: Orthopaedic Surgery

## 2019-12-12 NOTE — Telephone Encounter (Signed)
IC advised sometimes process to neurosurgery can take some time but all referral information submitted and should receive a call back.

## 2019-12-12 NOTE — Telephone Encounter (Signed)
Submitted to them on 07/22. Will call pt

## 2019-12-12 NOTE — Telephone Encounter (Signed)
Pt called wanting to check on their referral for a neurosurgeon; pts would like a CB  [phone]

## 2019-12-17 ENCOUNTER — Other Ambulatory Visit: Payer: Self-pay

## 2019-12-17 ENCOUNTER — Ambulatory Visit: Payer: Medicare Other | Attending: Orthopaedic Surgery | Admitting: Physical Therapy

## 2019-12-17 ENCOUNTER — Encounter: Payer: Self-pay | Admitting: Physical Therapy

## 2019-12-17 DIAGNOSIS — R293 Abnormal posture: Secondary | ICD-10-CM | POA: Diagnosis not present

## 2019-12-17 DIAGNOSIS — G8929 Other chronic pain: Secondary | ICD-10-CM | POA: Insufficient documentation

## 2019-12-17 DIAGNOSIS — M25511 Pain in right shoulder: Secondary | ICD-10-CM | POA: Diagnosis not present

## 2019-12-17 DIAGNOSIS — M25611 Stiffness of right shoulder, not elsewhere classified: Secondary | ICD-10-CM | POA: Diagnosis not present

## 2019-12-17 DIAGNOSIS — R29898 Other symptoms and signs involving the musculoskeletal system: Secondary | ICD-10-CM | POA: Insufficient documentation

## 2019-12-17 NOTE — Therapy (Signed)
[facility] [address]  [place], [place], [address] Phone: [phone]   Fax:  [phone]  Physical Therapy Evaluation  Patient Details  Name: David [name] MRN: [id number] Date of Birth: March 31, 1951 Referring Provider (PT): Jean Rosenthal, MD   Encounter Date: 12/17/2019   PT End of Session - 12/17/19 0930    Visit Number 1    Number of Visits 13    Date for PT Re-Evaluation 01/28/20    Authorization Type Medicare & [employer]    PT Start Time [phone]    PT Stop Time 0923    PT Time Calculation (min) 37 min    Activity Tolerance Patient limited by pain;Patient tolerated treatment well    Behavior During Therapy David [name] for tasks assessed/performed           Past Medical History:  Diagnosis Date  . Arthritis   . BBB (bundle branch block) 03/15/2016  . Carpal tunnel syndrome   . DDD (degenerative disc disease), lumbar 03/15/2016  . Depression   . Dysrhythmia    had some tachycardia with steroids  . Elevated cholesterol 03/15/2016  . GERD (gastroesophageal reflux disease)    no meds since gastric banding  . Headache(784.0)    Ocular Migraines - takes Ambien for it  . HTN (hypertension) 03/15/2016  . Hyperlipemia   . Hypertension   . Malignant melanoma of right side of neck ([place]) 03/15/2016   30 years ago   . Neuralgia 03/15/2016  . Rheumatoid arthritis ([facility]) 03/15/2016   Sero Negative.   Marland Kitchen RLS (restless legs syndrome)   . Shortness of breath    DOE  . Sleep apnea    could not use a cpap  . Snores   . Status post gastric banding   . Wears glasses    driving    Past Surgical History:  Procedure Laterality Date  . CARPAL TUNNEL RELEASE Right 09/26/2012   Procedure: CARPAL TUNNEL RELEASE;  Surgeon: Tennis Must, MD;  Location: [facility];  Service: Orthopedics;  Laterality: Right;  . CARPAL TUNNEL RELEASE Left 10/24/2012   Procedure: CARPAL TUNNEL RELEASE;  Surgeon: Tennis Must, MD;  Location: [facility];  Service: Orthopedics;  Laterality: Left;  . EXCISION MELANOMA WITH SENTINEL LYMPH NODE BIOPSY  1984   rt neck with mulpipal nodes and some muscle excised rt neck-shoulder  . KNEE ARTHROPLASTY    . knee replacement    . LAPAROSCOPIC GASTRIC BANDING  03/15/2009  . LAPAROSCOPIC GASTRIC BANDING    . MUSCLE BIOPSY  2004   rt thigh to r/o myocitis  . nerve conduction velosity test    . TONSILLECTOMY    . TOTAL KNEE ARTHROPLASTY Bilateral 12/05/2010  . TRIGGER FINGER RELEASE Right 09/26/2012   Procedure: RELEASE TRIGGER FINGER/A-1 PULLEY RING AND SMALL ;  Surgeon: Tennis Must, MD;  Location: [facility];  Service: Orthopedics;  Laterality: Right;  . TRIGGER FINGER RELEASE Left 10/24/2012   Procedure: RELEASE TRIGGER FINGER/A-1 PULLEY LEFT MIDDLE AND LEFT RING;  Surgeon: Tennis Must, MD;  Location: [facility];  Service: Orthopedics;  Laterality: Left;    There were no vitals filed for this visit.    Subjective Assessment - 12/17/19 0848    Subjective Patient reports that about 3 weeks ago he received a cortisone injection to his R shoulder with good improvement in pain. Also notes that he is also going to  be following up with neurosurgery for his neck- requesting to focus on shoulder pain with PT. Pain is located over the anterior and posterior shoulder. Worse with R sidelying and after periods of movement; worse in the past 4 months. Denies N/T or radiation. In the 1980's he had melanoma removed from the R side of his neck, resulting in the surgical removal of the muscles in this region and has had limited shoulder ROM since.    Pertinent History RA, neuralgia, R neck melanoma, HTN, HLD, B carpal tunnel release, BBB, B TKA    Limitations Lifting;House hold activities    Diagnostic tests 12/04/19 L shoulder MRI: Severe supraspinatus tendinosis with small partial-thickness bursal surface tear, partial thickness  subscapularis tendon tear, infraspinatus and biceps tendinosis, AC jt OA; 12/04/19 cervical MRI: C5-C6 mild/moderate spinal canal stenosis with minimal flattening of the ventral David [name]    Patient Stated Goals get rid of pain    Currently in Pain? Yes    Pain Score 4     Pain Location Shoulder    Pain Orientation Right    Pain Descriptors / Indicators Aching    Pain Type Chronic pain              [facility] PT Assessment - 12/17/19 0855      Assessment   Medical Diagnosis Neck pain, chronic R shoulder pain    Referring Provider (PT) Jean Rosenthal, MD    Onset Date/Surgical Date 08/17/19    Hand Dominance Right    Next MD Visit 01/05/20    Prior Therapy yes      Precautions   Precautions None      Balance Screen   Has the patient fallen in the past 6 months Yes    How many times? 6   reports d/t "clutter" or out of bed   Has the patient had a decrease in activity level because of a fear of falling?  No    Is the patient reluctant to leave their home because of a fear of falling?  No      Home Ecologist residence    Living Arrangements Spouse/significant other    Available Help at Discharge Family    Type of [place]      Prior Function   Level of [place] Retired    Leisure yardwork, fishing      Cognition   Overall Cognitive Status Within Functional Limits for tasks assessed      Observation/Other Assessments   Observations 3 quarter-sized open wounds over R UT without drainage, redness, or warmth; restricted scar tissue over R lateral side of neck       Sensation   Light Touch Appears Intact      Coordination   Gross Motor Movements are Fluid and Coordinated Yes      Posture/Postural Control   Posture/Postural Control Postural limitations    Postural Limitations Rounded Shoulders;Forward head    Posture Comments evident dowager's hump      ROM / Strength   AROM / PROM / Strength AROM;Strength       AROM   AROM Assessment Site Shoulder    Right/Left Shoulder Right;Left    Right Shoulder Flexion 117 Degrees   painful & difficulty   Right Shoulder ABduction 94 Degrees   painful & difficult   Right Shoulder Internal Rotation --   FIR T9 & pain   Right Shoulder External Rotation --   FER  C4 & pain   Left Shoulder Flexion 145 Degrees    Left Shoulder ABduction 166 Degrees    Left Shoulder Internal Rotation --   FIR T9   Left Shoulder External Rotation --   FER T1     Strength   Strength Assessment Site Shoulder    Right/Left Shoulder Right;Left    Right Shoulder Flexion 4+/5    Right Shoulder ABduction 4+/5    Right Shoulder Internal Rotation 4/5    Right Shoulder External Rotation 4-/5    Left Shoulder Flexion 5/5    Left Shoulder ABduction 4+/5    Left Shoulder Internal Rotation 4+/5    Left Shoulder External Rotation 4+/5      Palpation   Palpation comment considerable tone in R UT; TTP in R pec, proximal biceps tendon, and biceps muscle belly                      Objective measurements completed on examination: See above findings.               PT Education - 12/17/19 0929    Education Details prognosis, POC, HEP    Person(s) Educated Patient    Methods Explanation;Demonstration;Tactile cues;Verbal cues;Handout    Comprehension Verbalized understanding;Returned demonstration            PT Short Term Goals - 12/17/19 1134      PT SHORT TERM GOAL #1   Title Patient to be independent with initial HEP.    Time 3    Period Weeks    Status New    Target Date 01/07/20             PT Long Term Goals - 12/17/19 1134      PT LONG TERM GOAL #1   Title Patient to be independent with advanced HEP.    Time 6    Period Weeks    Status New    Target Date 01/28/20      PT LONG TERM GOAL #2   Title Patient to demonstrate R shoulder AROM WFL and without pain limiting.    Time 6    Period Weeks    Status New    Target Date 01/28/20       PT LONG TERM GOAL #3   Title Patient to demonstrate R shoulder strength >/=4+/5.    Time 6    Period Weeks    Status New    Target Date 01/28/20      PT LONG TERM GOAL #4   Title Patient to report 60% improvement in tolerance for R sidelying.    Time 6    Period Weeks    Status New    Target Date 01/28/20      PT LONG TERM GOAL #5   Title Patient to return to yardwork without pain.    Time 6    Period Weeks    Status New    Target Date 01/28/20                  Plan - 12/17/19 1124    Clinical Impression Statement Patient is a 69y/o M presenting to OPPT with c/o chronic R shoulder pain with insidious exacerbation in the last 4 months. Patient also with recent cervical MRI showing mild/moderate spinal canal stenosis with minimal flattening of the ventral [place] which he plans to f/u with neurosurgery for and requesting to focus PT POC on R shoulder at this time. Shoulder pain  occurs over the anterior and posterior aspects, worse with R sidelying and after periods of increased activity. Denies N/T or radiation. Notes a baseline of R shoulder ROM limitation d/t remote hx of melanoma excision over R side of neck. Patient today presenting with rounded and forward head posture, limited and painful R shoulder AROM, decreased R shoulder strength, increased muscle tension in R UT, and TTP in R pec, proximal biceps tendon, and biceps muscle belly. Patient was educated on gentle AAROM HEP- patient reported understanding. Would benefit from skilled PT services 2x/week for 6 weeks to address aforementioned impairments.    Personal Factors and Comorbidities Age;Comorbidity 3+;Fitness;Past/Current Experience;Time since onset of injury/illness/exacerbation    Comorbidities RA, neuralgia, R neck melanoma, HTN, HLD, B carpal tunnel release, BBB, B TKA    Examination-Activity Limitations Bathing;Bed Mobility;Sleep;Carry;Dressing;Hygiene/Grooming;Lift;Reach Overhead;Self Feeding     Examination-Participation Restrictions Cleaning;Shop;Community Activity;Driving;Yard Work;Interpersonal Relationship;Laundry;Meal Prep    Stability/Clinical Decision Making Stable/Uncomplicated    Clinical Decision Making Low    Rehab Potential Good    PT Frequency 2x / week    PT Duration 6 weeks    PT Treatment/Interventions ADLs/Self Care Home Management;Cryotherapy;Electrical Stimulation;Iontophoresis 4mg /ml Dexamethasone;Moist Heat;Balance training;Therapeutic exercise;Therapeutic activities;Functional mobility training;Ultrasound;Neuromuscular re-education;Patient/family education;Manual techniques;Vasopneumatic Device;Taping;Energy conservation;Dry needling;Passive range of motion;Scar mobilization    PT Next Visit Plan shoulder FOTO; progress R shoulder PROM/AAROM/AROM, RTC and periscapular strengthening    Consulted and Agree with Plan of Care Patient           Patient will benefit from skilled therapeutic intervention in order to improve the following deficits and impairments:  Hypomobility, Increased edema, Decreased scar mobility, Decreased activity tolerance, Decreased strength, Increased fascial restricitons, Impaired UE functional use, Pain, Increased muscle spasms, Improper body mechanics, Decreased range of motion, Impaired flexibility, Postural dysfunction  Visit Diagnosis: Chronic right shoulder pain  Stiffness of right shoulder, not elsewhere classified  Abnormal posture  Other symptoms and signs involving the musculoskeletal system     Problem List Patient Active Problem List   Diagnosis Date Noted  . SBO (small bowel obstruction) ([facility]) 08/21/2017  . Trochanteric bursitis of left hip 01/18/2017  . Lateral epicondylitis, right elbow 01/18/2017  . Rheumatoid arthritis ([facility]) 03/15/2016  . DJD (degenerative joint disease), cervical 03/15/2016  . DDD (degenerative disc disease), lumbar 03/15/2016  . HTN (hypertension) 03/15/2016  . Obesity 03/15/2016  . Elevated  cholesterol 03/15/2016  . Neuralgia 03/15/2016  . Malignant melanoma of right side of neck ([facility]) 03/15/2016  . BBB (bundle branch block) 03/15/2016  . High risk medication use 03/15/2016  . Osteoarthritis of lumbar spine 03/15/2016  . Primary osteoarthritis of both hands 03/15/2016  . Primary osteoarthritis of both knees 03/15/2016  . Mesenteric adenitis 06/29/2015     David [name], PT, DPT 12/17/19 11:46 AM   [facility] [address]  [place], [place], [address] Phone: [phone]   Fax:  [phone]  Name: David [name] MRN: [id number] Date of Birth: 01-25-1951

## 2019-12-23 ENCOUNTER — Other Ambulatory Visit: Payer: Self-pay

## 2019-12-23 ENCOUNTER — Ambulatory Visit: Payer: Medicare Other | Attending: Orthopaedic Surgery

## 2019-12-23 DIAGNOSIS — M25511 Pain in right shoulder: Secondary | ICD-10-CM | POA: Insufficient documentation

## 2019-12-23 DIAGNOSIS — G8929 Other chronic pain: Secondary | ICD-10-CM | POA: Diagnosis not present

## 2019-12-23 DIAGNOSIS — R293 Abnormal posture: Secondary | ICD-10-CM | POA: Diagnosis not present

## 2019-12-23 DIAGNOSIS — R29898 Other symptoms and signs involving the musculoskeletal system: Secondary | ICD-10-CM | POA: Insufficient documentation

## 2019-12-23 DIAGNOSIS — M25611 Stiffness of right shoulder, not elsewhere classified: Secondary | ICD-10-CM | POA: Diagnosis not present

## 2019-12-23 NOTE — Progress Notes (Signed)
Office Visit Note  Patient: David [name]             Date of Birth: [birth year]           MRN: [id number]             PCP: Orpah Melter, MD Referring: Orpah Melter, MD Visit Date: 01/06/2020 Occupation: @GUAROCC @  Subjective:  Neck pain   History of Present Illness: David [name] is a 69 y.o. male with history of seronegative rheumatoid arthritis and osteoarthritis.  Patient is taking methotrexate 1 mL subcutaneous injections once weekly, sulfasalazine 500 mg 2 tablets by mouth twice daily, Plaquenil 200 mg 1 tablet by mouth twice daily.  He is tolerating his medications without any side effects.  He has not missed any doses recently.  He denies any recent rheumatoid arthritis flares.  He continues to have morning stiffness but denies any increased joint pain or joint swelling.  He states that he was evaluated by Dr. Ninfa Linden for the neuralgias he was experiencing in bilateral upper extremities.  He states that Dr. Ninfa Linden performed a right shoulder cortisone injection and referred him to physical therapy.  He states that physical therapy exacerbated his discomfort.  Dr. Ninfa Linden proceeded with scheduling an MRI of his right shoulder and C-spine.  According to the patient he has been evaluated by Dr. Arnoldo Morale and is planning a discectomy in the near future.    Activities of Daily Living:  Patient reports morning stiffness for 4  hours.   Patient Reports nocturnal pain.  Difficulty dressing/grooming: Denies Difficulty climbing stairs: Denies Difficulty getting out of chair: Denies Difficulty using hands for taps, buttons, cutlery, and/or writing: Denies  Review of Systems  Constitutional: Positive for fatigue. Negative for night sweats.  HENT: Positive for mouth dryness. Negative for mouth sores and nose dryness.   Eyes: Negative for redness and dryness.  Respiratory: Negative for shortness of breath and difficulty breathing.   Cardiovascular: Negative for chest pain,  palpitations, hypertension, irregular heartbeat and swelling in legs/feet.  Gastrointestinal: Negative for blood in stool, constipation and diarrhea.  Endocrine: Negative for increased urination.  Genitourinary: Negative for painful urination.  Musculoskeletal: Positive for arthralgias, joint pain and morning stiffness. Negative for joint swelling, myalgias, muscle weakness, muscle tenderness and myalgias.  Skin: Negative for color change, rash, hair loss, nodules/bumps, skin tightness, ulcers and sensitivity to sunlight.  Allergic/Immunologic: Negative for susceptible to infections.  Neurological: Negative for dizziness, fainting, memory loss, night sweats and weakness.  Hematological: Negative for swollen glands.  Psychiatric/Behavioral: Negative for depressed mood and sleep disturbance. The patient is not nervous/anxious.     PMFS History:  Patient Active Problem List   Diagnosis Date Noted  . SBO (small bowel obstruction) ([place]) 08/21/2017  . Trochanteric bursitis of left hip 01/18/2017  . Lateral epicondylitis, right elbow 01/18/2017  . Rheumatoid arthritis ([place]) 03/15/2016  . DJD (degenerative joint disease), cervical 03/15/2016  . DDD (degenerative disc disease), lumbar 03/15/2016  . HTN (hypertension) 03/15/2016  . Obesity 03/15/2016  . Elevated cholesterol 03/15/2016  . Neuralgia 03/15/2016  . Malignant melanoma of right side of neck ([place]) 03/15/2016  . BBB (bundle branch block) 03/15/2016  . High risk medication use 03/15/2016  . Osteoarthritis of lumbar spine 03/15/2016  . Primary osteoarthritis of both hands 03/15/2016  . Primary osteoarthritis of both knees 03/15/2016  . Mesenteric adenitis 06/29/2015    Past Medical History:  Diagnosis Date  . Arthritis   . BBB (bundle branch block) 03/15/2016  .  Carpal tunnel syndrome   . DDD (degenerative disc disease), lumbar 03/15/2016  . Depression   . Dysrhythmia    had some tachycardia with steroids  . Elevated  cholesterol 03/15/2016  . GERD (gastroesophageal reflux disease)    no meds since gastric banding  . Headache(784.0)    Ocular Migraines - takes Ambien for it  . HTN (hypertension) 03/15/2016  . Hyperlipemia   . Hypertension   . Malignant melanoma of right side of neck ([place]) 03/15/2016   30 years ago   . Neuralgia 03/15/2016  . Rheumatoid arthritis ([place]) 03/15/2016   Sero Negative.   Marland Kitchen RLS (restless legs syndrome)   . Shortness of breath    DOE  . Sleep apnea    could not use a cpap  . Snores   . Status post gastric banding   . Wears glasses    driving    Family History  Problem Relation Age of Onset  . Heart disease Mother   . Alzheimer's disease Father    Past Surgical History:  Procedure Laterality Date  . CARPAL TUNNEL RELEASE Right 09/26/2012   Procedure: CARPAL TUNNEL RELEASE;  Surgeon: Tennis Must, MD;  Location: [facility];  Service: Orthopedics;  Laterality: Right;  . CARPAL TUNNEL RELEASE Left 10/24/2012   Procedure: CARPAL TUNNEL RELEASE;  Surgeon: Tennis Must, MD;  Location: [facility];  Service: Orthopedics;  Laterality: Left;  . EXCISION MELANOMA WITH SENTINEL LYMPH NODE BIOPSY  1984   rt neck with mulpipal nodes and some muscle excised rt neck-shoulder  . KNEE ARTHROPLASTY    . knee replacement    . LAPAROSCOPIC GASTRIC BANDING  03/15/2009  . LAPAROSCOPIC GASTRIC BANDING    . MUSCLE BIOPSY  2004   rt thigh to r/o myocitis  . nerve conduction velosity test    . TONSILLECTOMY    . TOTAL KNEE ARTHROPLASTY Bilateral 12/05/2010  . TRIGGER FINGER RELEASE Right 09/26/2012   Procedure: RELEASE TRIGGER FINGER/A-1 PULLEY RING AND SMALL ;  Surgeon: Tennis Must, MD;  Location: [facility];  Service: Orthopedics;  Laterality: Right;  . TRIGGER FINGER RELEASE Left 10/24/2012   Procedure: RELEASE TRIGGER FINGER/A-1 PULLEY LEFT MIDDLE AND LEFT RING;  Surgeon: Tennis Must, MD;  Location: [facility];  Service:  Orthopedics;  Laterality: Left;   Social History   Social History Narrative  . Not on file   Immunization History  Administered Date(s) Administered  . Moderna SARS-COVID-2 Vaccination 06/10/2019, 07/08/2019     Objective: Vital Signs: There were no vitals taken for this visit.   Physical Exam Vitals and nursing note reviewed.  Constitutional:      Appearance: He is well-developed.  HENT:     Head: Normocephalic and atraumatic.  Eyes:     Conjunctiva/sclera: Conjunctivae normal.     Pupils: Pupils are equal, round, and reactive to light.  Pulmonary:     Effort: Pulmonary effort is normal.  Abdominal:     General: Bowel sounds are normal.     Palpations: Abdomen is soft.  Musculoskeletal:     Cervical back: Normal range of motion and neck supple.  Skin:    General: Skin is warm and dry.     Capillary Refill: Capillary refill takes less than 2 seconds.  Neurological:     Mental Status: He is alert and oriented to person, place, and time.  Psychiatric:        Behavior: Behavior normal.  Musculoskeletal Exam: C-spine limited range of motion with lateral rotation.  Painful flexion extension of the C-spine.  Thoracic and lumbar spine have good range of motion.  Right shoulder has slightly limited range of motion with discomfort.  Left shoulder has good range of motion with no discomfort.  Right elbow joint contracture noted.  Wrist joints, MCPs, PIPs, and DIPs have good range of motion with no synovitis.  He has PIP and DIP thickening consistent with osteoarthritis of both hands.  He has complete fist formation bilaterally.  Hip joints have good range of motion with no discomfort.  Bilateral knee replacements a full range of motion with no discomfort.  Ankle joints have good range of motion no tenderness or inflammation  CDAI Exam: CDAI Score: -- Patient Global: --; Provider Global: -- Swollen: --; Tender: -- Joint Exam 01/06/2020   No joint exam has been documented for  this visit   There is currently no information documented on the homunculus. Go to the Rheumatology activity and complete the homunculus joint exam.  Investigation: No additional findings.  Imaging: No results found.  Recent Labs: Lab Results  Component Value Date   WBC 6.8 09/22/2019   HGB 15.4 09/22/2019   PLT 285 09/22/2019   NA 140 09/22/2019   K 4.7 09/22/2019   CL 102 09/22/2019   CO2 32 09/22/2019   GLUCOSE 166 (H) 09/22/2019   BUN 18 09/22/2019   CREATININE 1.06 09/22/2019   BILITOT 0.4 09/22/2019   ALKPHOS 72 09/14/2018   AST 31 09/22/2019   ALT 25 09/22/2019   PROT 6.4 09/22/2019   ALBUMIN 3.8 09/14/2018   CALCIUM 9.8 09/22/2019   GFRAA 84 09/22/2019    Speciality Comments: PLQ Eye Exam: 11/15/18 WNL @ [facility]   Procedures:  No procedures performed Allergies: Codeine, Dilaudid [hydromorphone hcl], Fentanyl, Nucynta [tapentadol], and Penicillins   Assessment / Plan:     Visit Diagnoses: Rheumatoid arthritis of multiple sites with negative rheumatoid factor ([place]): He has no synovitis on exam.  He has not had any recent rheumatoid arthritis flares.  He is clinically doing well on methotrexate 1 mL subcutaneous injections once weekly, folic acid 2 mg by mouth daily, sulfasalazine 500 mg 2 tablets twice daily, and Plaquenil 200 mg 1 tablet by mouth twice daily.  He is tolerating these medications without any side effects.  He continues to have morning stiffness lasting several hours but denies any increased discomfort or inflammation.  He has no difficulty performing ADLs.  He will continue on the current treatment regimen.  He was advised to hold methotrexate, sulfasalazine, Plaquenil 1 week prior to his upcoming C-spine surgery with Dr. Arnoldo Morale.  He will require clearance by Dr. Arnoldo Morale prior to restarting on these medications.  He will follow-up in the office in 5 months.  High risk medication use - Methotrexate 1 mL every 7 days, folic acid 1 mg 2 tablets daily,  sulfasalazine 500 mg 2 tablets twice daily, and Plaquenil 200 mg 1 tablet twice daily.  CBC and CMP were within normal limits on 09/22/2019 (except for elevated glucose) he is due to update lab work today.  Orders for CBC and CMP released.  He will update labs in November and every 3 months to monitor for drug toxicity.  Standing orders for CBC and CMP are in place.- Plan: CBC with Differential/Platelet, COMPLETE METABOLIC PANEL WITH GFR He has received both maternal vaccinations.  He plans on proceeding with the booster vaccine once available.  He was  advised to notify us if he develops a COVID-19 infection or to receive the antibody infusion.  He was encouraged to continue to mask, social distance, and practice good hand hygiene.   Primary osteoarthritis of both hands: He has PIP and DIP thickening consistent with osteoarthritis of both hands.  He has complete fist formation bilaterally.  Joint protection and muscle strengthening were discussed.  Arthritis of both acromioclavicular joints - X-rays of both shoulder joints were obtained on 12/04/2018 which were consistent with acromioclavicular joint arthritis.  He was evaluated by Dr. Ninfa Linden who performed a right shoulder cortisone injection and referred him to physical therapy.  He had a right shoulder MRI performed on 12/04/2019 which revealed severe supraspinatus tendinosis, small partial-thickness articular tear of the superior subscapularis tendon, and moderate infraspinatus and severe intra-articular biceps tendinosis.  He was also found to have moderate AC joint arthritis.  His discomfort has improved since having the cortisone injection performed.  He will be proceeding with right shoulder arthroscopic surgery with Dr. Ninfa Linden soon.  Hx of total knee replacement, bilateral: He has good ROM with no discomfort.  No warmth or effusion noted.   DDD (degenerative disc disease), cervical: He has limited range of motion with lateral rotation.  He has  discomfort with flexion extension of the C-spine.  He had MRI of the C-spine performed on 12/04/2019 which revealed cervical spondylosis and C5-C6 multifactorial mild to moderate spinal canal stenosis with minimal flattening of the ventral spinal cord.  He was found to have severe bilateral neural laminal narrowing as well.  He was evaluated by Dr. Arnoldo Morale and is planning on having a discectomy in the near future.  He was advised to hold methotrexate, PLQ, and sulfasalazine 1 week prior to surgery and then needs to be cleared by Dr. Arnoldo Morale prior to restarting.  DDD (degenerative disc disease), lumbar: He is not having any increased discomfort in her lower back at this time.   History of melanoma - He sees Dr. Ronnald Ramp on a yearly basis.   Neuralgia - He was evaluated by Dr. Jannifer Franklin and underwent a thorough work-up.  He was evaluated by Dr. Ninfa Linden who proceeded with scheduling an MRI of the right shoulder and C-spine.  He is planning to have a discectomy performed by Dr. Arnoldo Morale in the near future.  He has noticed a significant improvement in his discomfort since increasing the dose of gabapentin to 900 mg 3 times daily.    Orders: Orders Placed This Encounter  Procedures  . CBC with Differential/Platelet  . COMPLETE METABOLIC PANEL WITH GFR   No orders of the defined types were placed in this encounter.    Follow-Up Instructions: Return in about 5 months (around 06/07/2020) for Rheumatoid arthritis, Osteoarthritis.   Ofilia Neas, PA-C  Note - This record has been created using Dragon software.  Chart creation errors have been sought, but may not always  have been located. Such creation errors do not reflect on  the standard of medical care.

## 2019-12-23 NOTE — Therapy (Signed)
[facility] [address]  [place], [place], [address] Phone: [phone]   Fax:  [phone]  Physical Therapy Treatment  Patient Details  Name: David [name] MRN: [id number] Date of Birth: July 16, 1950 Referring Provider (PT): Jean Rosenthal, MD   Encounter Date: 12/23/2019   PT End of Session - 12/23/19 1504    Visit Number 2    Number of Visits 13    Date for PT Re-Evaluation 01/28/20    Authorization Type Medicare & [employer]    PT Start Time 7672    PT Stop Time 1543    PT Time Calculation (min) 57 min    Activity Tolerance Patient tolerated treatment well    Behavior During Therapy [facility] for tasks assessed/performed           Past Medical History:  Diagnosis Date  . Arthritis   . BBB (bundle branch block) 03/15/2016  . Carpal tunnel syndrome   . DDD (degenerative disc disease), lumbar 03/15/2016  . Depression   . Dysrhythmia    had some tachycardia with steroids  . Elevated cholesterol 03/15/2016  . GERD (gastroesophageal reflux disease)    no meds since gastric banding  . Headache(784.0)    Ocular Migraines - takes Ambien for it  . HTN (hypertension) 03/15/2016  . Hyperlipemia   . Hypertension   . Malignant melanoma of right side of neck ([place]) 03/15/2016   30 years ago   . Neuralgia 03/15/2016  . Rheumatoid arthritis ([place]) 03/15/2016   Sero Negative.   Marland Kitchen RLS (restless legs syndrome)   . Shortness of breath    DOE  . Sleep apnea    could not use a cpap  . Snores   . Status post gastric banding   . Wears glasses    driving    Past Surgical History:  Procedure Laterality Date  . CARPAL TUNNEL RELEASE Right 09/26/2012   Procedure: CARPAL TUNNEL RELEASE;  Surgeon: Tennis Must, MD;  Location: [facility];  Service: Orthopedics;  Laterality: Right;  . CARPAL TUNNEL RELEASE Left 10/24/2012   Procedure: CARPAL TUNNEL RELEASE;  Surgeon: Tennis Must, MD;  Location:  [facility];  Service: Orthopedics;  Laterality: Left;  . EXCISION MELANOMA WITH SENTINEL LYMPH NODE BIOPSY  1984   rt neck with mulpipal nodes and some muscle excised rt neck-shoulder  . KNEE ARTHROPLASTY    . knee replacement    . LAPAROSCOPIC GASTRIC BANDING  03/15/2009  . LAPAROSCOPIC GASTRIC BANDING    . MUSCLE BIOPSY  2004   rt thigh to r/o myocitis  . nerve conduction velosity test    . TONSILLECTOMY    . TOTAL KNEE ARTHROPLASTY Bilateral 12/05/2010  . TRIGGER FINGER RELEASE Right 09/26/2012   Procedure: RELEASE TRIGGER FINGER/A-1 PULLEY RING AND SMALL ;  Surgeon: Tennis Must, MD;  Location: [facility];  Service: Orthopedics;  Laterality: Right;  . TRIGGER FINGER RELEASE Left 10/24/2012   Procedure: RELEASE TRIGGER FINGER/A-1 PULLEY LEFT MIDDLE AND LEFT RING;  Surgeon: Tennis Must, MD;  Location: [facility];  Service: Orthopedics;  Laterality: Left;    There were no vitals filed for this visit.   Subjective Assessment - 12/23/19 1501    Subjective Had some increased pain after doing HEP.    Pertinent History RA, neuralgia, R neck melanoma, HTN, HLD, B carpal tunnel release, BBB, B TKA    Diagnostic tests 12/04/19 L  shoulder MRI: Severe supraspinatus tendinosis with small partial-thickness bursal surface tear, partial thickness subscapularis tendon tear, infraspinatus and biceps tendinosis, AC jt OA; 12/04/19 cervical MRI: C5-C6 mild/moderate spinal canal stenosis with minimal flattening of the ventral [place]    Patient Stated Goals get rid of pain    Currently in Pain? Yes    Pain Score 4     Pain Location Shoulder    Pain Orientation Right    Pain Descriptors / Indicators Aching    Pain Type Chronic pain    Pain Frequency Intermittent    Multiple Pain Sites No              [facility] PT Assessment - 12/23/19 0001      Observation/Other Assessments   Focus on Therapeutic Outcomes (FOTO)  Neck FOTO: 88% status (12% limitation)                          [facility] Adult PT Treatment/Exercise - 12/23/19 0001      Self-Care   Self-Care Other Self-Care Comments    Other Self-Care Comments  Discussed patient imaging and encouraged pt. to avoid painful arc of movement with all shoulder motions; discussed pt. possible shoulder "scope" in future      Shoulder Exercises: Supine   Protraction Both;15 reps;AROM    Protraction Limitations cues for proper motion     External Rotation Right;AAROM;10 reps    External Rotation Limitations cues to slow motion; wand    pillow under elbow    Flexion Right;AAROM;10 reps    Flexion Limitations wand; cues to slow motion     ABduction Right;AAROM;10 reps    ABduction Limitations wand; cues to slow motion       Shoulder Exercises: Standing   Extension Both;10 reps;Strengthening;Theraband    Theraband Level (Shoulder Extension) Level 2 (Red)    Extension Limitations cues for proper scapular retraction     Row Both;10 reps    Theraband Level (Shoulder Row) Level 2 (Red)    Row Limitations Cues for proper scapular retraction       Shoulder Exercises: Pulleys   Flexion 3 minutes    Flexion Limitations cues to perform slowely     Scaption 3 minutes    Scaption Limitations cues to perform                     PT Short Term Goals - 12/23/19 1504      PT SHORT TERM GOAL #1   Title Patient to be independent with initial HEP.    Time 3    Period Weeks    Status On-going    Target Date 01/07/20             PT Long Term Goals - 12/23/19 1504      PT LONG TERM GOAL #1   Title Patient to be independent with advanced HEP.    Time 6    Period Weeks    Status On-going      PT LONG TERM GOAL #2   Title Patient to demonstrate R shoulder AROM WFL and without pain limiting.    Time 6    Period Weeks    Status On-going      PT LONG TERM GOAL #3   Title Patient to demonstrate R shoulder strength >/=4+/5.    Time 6    Period Weeks    Status On-going      PT  LONG TERM  GOAL #4   Title Patient to report 60% improvement in tolerance for R sidelying.    Time 6    Period Weeks    Status On-going      PT LONG TERM GOAL #5   Title Patient to return to yardwork without pain.    Time 6    Period Weeks    Status On-going                 Plan - 12/23/19 1506    Clinical Impression Statement David [name] reporting he has been performing HEP.  Had increased shoulder pain and soreness from performing HEP this morning.  Review of HEP revealed that patient was performing wand supine AAROM HEP activities with excessive fast movements.  Cued patient for proper pacing for most benefit from HEP activities.  Encouraged pt. to avoid painful arc of movement with all HEP activities and ended visit with 4/10 pain slightly up from baseline initial pain to start session in R shoulder.    Comorbidities RA, neuralgia, R neck melanoma, HTN, HLD, B carpal tunnel release, BBB, B TKA    Rehab Potential Good    PT Frequency 2x / week    PT Treatment/Interventions ADLs/Self Care Home Management;Cryotherapy;Electrical Stimulation;Iontophoresis 4mg /ml Dexamethasone;Moist Heat;Balance training;Therapeutic exercise;Therapeutic activities;Functional mobility training;Ultrasound;Neuromuscular re-education;Patient/family education;Manual techniques;Vasopneumatic Device;Taping;Energy conservation;Dry needling;Passive range of motion;Scar mobilization    PT Next Visit Plan Progress R shoulder PROM/AAROM/AROM, RTC and periscapular strengthening    Consulted and Agree with Plan of Care Patient           Patient will benefit from skilled therapeutic intervention in order to improve the following deficits and impairments:  Hypomobility, Increased edema, Decreased scar mobility, Decreased activity tolerance, Decreased strength, Increased fascial restricitons, Impaired UE functional use, Pain, Increased muscle spasms, Improper body mechanics, Decreased range of motion, Impaired flexibility,  Postural dysfunction  Visit Diagnosis: Chronic right shoulder pain  Stiffness of right shoulder, not elsewhere classified  Abnormal posture  Other symptoms and signs involving the musculoskeletal system     Problem List Patient Active Problem List   Diagnosis Date Noted  . SBO (small bowel obstruction) ([place]) 08/21/2017  . Trochanteric bursitis of left hip 01/18/2017  . Lateral epicondylitis, right elbow 01/18/2017  . Rheumatoid arthritis ([place]) 03/15/2016  . DJD (degenerative joint disease), cervical 03/15/2016  . DDD (degenerative disc disease), lumbar 03/15/2016  . HTN (hypertension) 03/15/2016  . Obesity 03/15/2016  . Elevated cholesterol 03/15/2016  . Neuralgia 03/15/2016  . Malignant melanoma of right side of neck ([place]) 03/15/2016  . BBB (bundle branch block) 03/15/2016  . High risk medication use 03/15/2016  . Osteoarthritis of lumbar spine 03/15/2016  . Primary osteoarthritis of both hands 03/15/2016  . Primary osteoarthritis of both knees 03/15/2016  . Mesenteric adenitis 06/29/2015   Bess Harvest, PTA 12/23/19 4:04 PM   [facility] [address]  [place], [place], [address] Phone: [phone]   Fax:  [phone]  Name: David [name] MRN: [id number] Date of Birth: [birth year]

## 2019-12-25 ENCOUNTER — Other Ambulatory Visit: Payer: Self-pay

## 2019-12-25 ENCOUNTER — Ambulatory Visit: Payer: Medicare Other

## 2019-12-25 DIAGNOSIS — R293 Abnormal posture: Secondary | ICD-10-CM | POA: Diagnosis not present

## 2019-12-25 DIAGNOSIS — M25611 Stiffness of right shoulder, not elsewhere classified: Secondary | ICD-10-CM

## 2019-12-25 DIAGNOSIS — G8929 Other chronic pain: Secondary | ICD-10-CM

## 2019-12-25 DIAGNOSIS — R29898 Other symptoms and signs involving the musculoskeletal system: Secondary | ICD-10-CM | POA: Diagnosis not present

## 2019-12-25 DIAGNOSIS — M25511 Pain in right shoulder: Secondary | ICD-10-CM | POA: Diagnosis not present

## 2019-12-25 NOTE — Therapy (Addendum)
[facility] [address]  [place], [place], [address] Phone: [phone]   Fax:  [phone]  Physical Therapy Treatment  Patient Details  Name: David [name] MRN: [id number] Date of Birth: March 13, 1951 Referring Provider (PT): Jean Rosenthal, MD   Progress Note Reporting Period 12/17/19 to 12/25/19  See note below for Objective Data and Assessment of Progress/Goals.     Encounter Date: 12/25/2019   PT End of Session - 12/25/19 1454    Visit Number 3    Number of Visits 13    Date for PT Re-Evaluation 01/28/20    Authorization Type Medicare & [employer]    PT Start Time 1447    PT Stop Time 1528    PT Time Calculation (min) 41 min    Activity Tolerance Patient tolerated treatment well    Behavior During Therapy WFL for tasks assessed/performed           Past Medical History:  Diagnosis Date  . Arthritis   . BBB (bundle branch block) 03/15/2016  . Carpal tunnel syndrome   . DDD (degenerative disc disease), lumbar 03/15/2016  . Depression   . Dysrhythmia    had some tachycardia with steroids  . Elevated cholesterol 03/15/2016  . GERD (gastroesophageal reflux disease)    no meds since gastric banding  . Headache(784.0)    Ocular Migraines - takes Ambien for it  . HTN (hypertension) 03/15/2016  . Hyperlipemia   . Hypertension   . Malignant melanoma of right side of neck ([place]) 03/15/2016   30 years ago   . Neuralgia 03/15/2016  . Rheumatoid arthritis ([place]) 03/15/2016   Sero Negative.   Marland Kitchen RLS (restless legs syndrome)   . Shortness of breath    DOE  . Sleep apnea    could not use a cpap  . Snores   . Status post gastric banding   . Wears glasses    driving    Past Surgical History:  Procedure Laterality Date  . CARPAL TUNNEL RELEASE Right 09/26/2012   Procedure: CARPAL TUNNEL RELEASE;  Surgeon: Tennis Must, MD;  Location: [facility];  Service: Orthopedics;   Laterality: Right;  . CARPAL TUNNEL RELEASE Left 10/24/2012   Procedure: CARPAL TUNNEL RELEASE;  Surgeon: Tennis Must, MD;  Location: [facility];  Service: Orthopedics;  Laterality: Left;  . EXCISION MELANOMA WITH SENTINEL LYMPH NODE BIOPSY  1984   rt neck with mulpipal nodes and some muscle excised rt neck-shoulder  . KNEE ARTHROPLASTY    . knee replacement    . LAPAROSCOPIC GASTRIC BANDING  03/15/2009  . LAPAROSCOPIC GASTRIC BANDING    . MUSCLE BIOPSY  2004   rt thigh to r/o myocitis  . nerve conduction velosity test    . TONSILLECTOMY    . TOTAL KNEE ARTHROPLASTY Bilateral 12/05/2010  . TRIGGER FINGER RELEASE Right 09/26/2012   Procedure: RELEASE TRIGGER FINGER/A-1 PULLEY RING AND SMALL ;  Surgeon: Tennis Must, MD;  Location: [facility];  Service: Orthopedics;  Laterality: Right;  . TRIGGER FINGER RELEASE Left 10/24/2012   Procedure: RELEASE TRIGGER FINGER/A-1 PULLEY LEFT MIDDLE AND LEFT RING;  Surgeon: Tennis Must, MD;  Location: [facility];  Service: Orthopedics;  Laterality: Left;    There were no vitals filed for this visit.   Subjective Assessment - 12/25/19 1454    Subjective Pt. doing ok.  Notes some soreness after last session.  Pertinent History RA, neuralgia, R neck melanoma, HTN, HLD, B carpal tunnel release, BBB, B TKA    Diagnostic tests 12/04/19 L shoulder MRI: Severe supraspinatus tendinosis with small partial-thickness bursal surface tear, partial thickness subscapularis tendon tear, infraspinatus and biceps tendinosis, AC jt OA; 12/04/19 cervical MRI: C5-C6 mild/moderate spinal canal stenosis with minimal flattening of the ventral [place]    Patient Stated Goals get rid of pain    Currently in Pain? Yes    Pain Score 1     Pain Location Shoulder    Pain Orientation Right;Upper    Pain Descriptors / Indicators Aching    Pain Type Chronic pain    Pain Frequency Intermittent    Multiple Pain Sites No              [facility]  PT Assessment - 12/25/19 0001      Assessment   Medical Diagnosis Neck pain, chronic R shoulder pain    Referring Provider (PT) Jean Rosenthal, MD    Onset Date/Surgical Date 08/17/19    Hand Dominance Right    Next MD Visit 01/05/20    Prior Therapy yes                         [facility] Adult PT Treatment/Exercise - 12/25/19 0001      Shoulder Exercises: Supine   Protraction Both;15 reps;AROM    Protraction Limitations wand    External Rotation Right;AAROM;10 reps    External Rotation Limitations cues to slow motion; wand    pillow under elbow   Flexion Right;AAROM;10 reps    Flexion Limitations wand; cues to slow motion     ABduction Right;AAROM;10 reps    ABduction Limitations wand; cues to slow motion       Shoulder Exercises: Pulleys   Flexion 3 minutes    Flexion Limitations cues to perform slowely     Scaption 3 minutes    Scaption Limitations cues to perform       Shoulder Exercises: ROM/Strengthening   Cybex Row 10 reps    Cybex Row Limitations 10# - low row      Neck Exercises: Stretches   Upper Trapezius Stretch Right;2 reps;30 seconds    Upper Trapezius Stretch Limitations hand anchored on chair                    PT Short Term Goals - 12/25/19 1513      PT SHORT TERM GOAL #1   Title Patient to be independent with initial HEP.    Time 3    Period Weeks    Status Achieved    Target Date 01/07/20             PT Long Term Goals - 12/23/19 1504      PT LONG TERM GOAL #1   Title Patient to be independent with advanced HEP.    Time 6    Period Weeks    Status On-going      PT LONG TERM GOAL #2   Title Patient to demonstrate R shoulder AROM WFL and without pain limiting.    Time 6    Period Weeks    Status On-going      PT LONG TERM GOAL #3   Title Patient to demonstrate R shoulder strength >/=4+/5.    Time 6    Period Weeks    Status On-going      PT LONG TERM GOAL #4   Title  Patient to report 60% improvement  in tolerance for R sidelying.    Time 6    Period Weeks    Status On-going      PT LONG TERM GOAL #5   Title Patient to return to yardwork without pain.    Time 6    Period Weeks    Status On-going                 Plan - 12/25/19 1457    Clinical Impression Statement David [name] with visible improvement in R shoulder ROM today with therex.  Reports he has been performing shoulder AAROM activities as home daily.  Tolerated progression of periscapular strengthening well today without pain.  improved tolerance for ER positioning with wand assisted AAROM abduction, ER in supine.  Ended visit with pt. pain free.    Comorbidities RA, neuralgia, R neck melanoma, HTN, HLD, B carpal tunnel release, BBB, B TKA    Rehab Potential Good    PT Frequency 2x / week    PT Duration 6 weeks    PT Treatment/Interventions ADLs/Self Care Home Management;Cryotherapy;Electrical Stimulation;Iontophoresis 22m/ml Dexamethasone;Moist Heat;Balance training;Therapeutic exercise;Therapeutic activities;Functional mobility training;Ultrasound;Neuromuscular re-education;Patient/family education;Manual techniques;Vasopneumatic Device;Taping;Energy conservation;Dry needling;Passive range of motion;Scar mobilization    PT Next Visit Plan Progress R shoulder PROM/AAROM/AROM, RTC and periscapular strengthening    Consulted and Agree with Plan of Care Patient           Patient will benefit from skilled therapeutic intervention in order to improve the following deficits and impairments:  Hypomobility, Increased edema, Decreased scar mobility, Decreased activity tolerance, Decreased strength, Increased fascial restricitons, Impaired UE functional use, Pain, Increased muscle spasms, Improper body mechanics, Decreased range of motion, Impaired flexibility, Postural dysfunction  Visit Diagnosis: Chronic right shoulder pain  Stiffness of right shoulder, not elsewhere classified  Abnormal posture  Other symptoms and  signs involving the musculoskeletal system     Problem List Patient Active Problem List   Diagnosis Date Noted  . SBO (small bowel obstruction) ([facility] 08/21/2017  . Trochanteric bursitis of left hip 01/18/2017  . Lateral epicondylitis, right elbow 01/18/2017  . Rheumatoid arthritis ([facility] 03/15/2016  . DJD (degenerative joint disease), cervical 03/15/2016  . DDD (degenerative disc disease), lumbar 03/15/2016  . HTN (hypertension) 03/15/2016  . Obesity 03/15/2016  . Elevated cholesterol 03/15/2016  . Neuralgia 03/15/2016  . Malignant melanoma of right side of neck ([facility] 03/15/2016  . BBB (bundle branch block) 03/15/2016  . High risk medication use 03/15/2016  . Osteoarthritis of lumbar spine 03/15/2016  . Primary osteoarthritis of both hands 03/15/2016  . Primary osteoarthritis of both knees 03/15/2016  . Mesenteric adenitis 06/29/2015    MBess Harvest PTA 12/25/19 3:52 PM   [facility] [address] [place] [address]Phone: [phone]  Fax:  [phone] Name: David OZERMRN: [id number] of Birth: [birth year]  PHYSICAL THERAPY DISCHARGE SUMMARY  Visits from Start of Care: 3  Current functional level related to goals / functional outcomes: Unable to assess; patient did not return d/t continued pain   Remaining deficits: Unable to assess   Education / Equipment: HEP  Plan: Patient agrees to discharge.  Patient goals were not met. Patient is being discharged due to not returning since the last visit.  ?????      YJanene Harvey PT, DPT 01/27/20 11:31 AM

## 2019-12-27 ENCOUNTER — Other Ambulatory Visit: Payer: Self-pay | Admitting: Physician Assistant

## 2019-12-27 DIAGNOSIS — M0609 Rheumatoid arthritis without rheumatoid factor, multiple sites: Secondary | ICD-10-CM

## 2019-12-27 DIAGNOSIS — Z79899 Other long term (current) drug therapy: Secondary | ICD-10-CM

## 2019-12-29 NOTE — Telephone Encounter (Signed)
Last Visit: 10/06/2019 Next Visit: 01/06/2020 Labs: 09/22/2019 Glucose is elevated-166. Rest of CMP WNL. CBC WNL.  PLQ Eye Exam: 11/15/18 WNL   Current Dose per office note on 10/06/2019: Plaquenil 200 mg 1 tablet twice daily. DX: Rheumatoid arthritis of multiple sites with negative rheumatoid factor   Patient advised he is due to update PLQ eye exam. Patient states he will all today to get that scheduled and will call the office with appointment date.  Okay to refill PLQ?

## 2019-12-30 ENCOUNTER — Ambulatory Visit: Payer: Medicare Other

## 2019-12-30 ENCOUNTER — Other Ambulatory Visit: Payer: Self-pay | Admitting: Physician Assistant

## 2019-12-30 DIAGNOSIS — Z79899 Other long term (current) drug therapy: Secondary | ICD-10-CM

## 2019-12-30 DIAGNOSIS — M0609 Rheumatoid arthritis without rheumatoid factor, multiple sites: Secondary | ICD-10-CM

## 2019-12-31 NOTE — Telephone Encounter (Signed)
PLQ eye exam scheduled for 02/11/2020.

## 2020-01-02 DIAGNOSIS — Z6839 Body mass index (BMI) 39.0-39.9, adult: Secondary | ICD-10-CM | POA: Diagnosis not present

## 2020-01-02 DIAGNOSIS — M4712 Other spondylosis with myelopathy, cervical region: Secondary | ICD-10-CM | POA: Diagnosis not present

## 2020-01-02 DIAGNOSIS — I10 Essential (primary) hypertension: Secondary | ICD-10-CM | POA: Diagnosis not present

## 2020-01-05 ENCOUNTER — Ambulatory Visit (INDEPENDENT_AMBULATORY_CARE_PROVIDER_SITE_OTHER): Payer: Medicare Other | Admitting: Orthopaedic Surgery

## 2020-01-05 ENCOUNTER — Encounter: Payer: Self-pay | Admitting: Orthopaedic Surgery

## 2020-01-05 DIAGNOSIS — M25511 Pain in right shoulder: Secondary | ICD-10-CM | POA: Diagnosis not present

## 2020-01-05 DIAGNOSIS — G8929 Other chronic pain: Secondary | ICD-10-CM

## 2020-01-05 DIAGNOSIS — M542 Cervicalgia: Secondary | ICD-10-CM

## 2020-01-05 DIAGNOSIS — M7541 Impingement syndrome of right shoulder: Secondary | ICD-10-CM | POA: Diagnosis not present

## 2020-01-05 DIAGNOSIS — M4802 Spinal stenosis, cervical region: Secondary | ICD-10-CM

## 2020-01-05 NOTE — Progress Notes (Signed)
Office Visit Note   Patient: David [name]           Date of Birth: 07-30-1950           MRN: [id number] Visit Date: 01/05/2020              Requested by: Orpah Melter, MD [address] [place],  [place] [address] PCP: Orpah Melter, MD   Assessment & Plan: Visit Diagnoses:  1. Chronic right shoulder pain   2. Neck pain   3. Spinal stenosis of cervical region   4. Impingement syndrome of right shoulder     Plan: At this point given the failure of all modes of conservative treatment, we are recommending an arthroscopic intervention with a right shoulder arthroscopy with subacromial decompression and extensive debridement.  I explained in detail what the surgery involves.  We discussed the interoperative and postoperative course as well as the risk and benefits of surgery.  This is something he wishes to have set up in the near future.  All questions and concerns were answered and addressed.  Follow-Up Instructions: Return for 1 week post-op.   Orders:  No orders of the defined types were placed in this encounter.  No orders of the defined types were placed in this encounter.     Procedures: No procedures performed   Clinical Data: No additional findings.   Subjective: Chief Complaint  Patient presents with  . Right Shoulder - Follow-up  The patient comes in today with continued right shoulder pain.  He does have severe impingement syndrome with significant tendinosis of the rotator cuff and AC joint arthritic changes.  This was verified on clinical exam and a MRI.  He has had conservative treatment with anti-inflammatories as well as steroid injections and has been an active physical therapy for her shoulder.  He is not having much progress with the pain he is having in his shoulder.  He is also been seen by neurosurgery who is recommended surgery on his neck but they feel that we should proceed with shoulder surgery first since it can be a less invasive surgery  to have right shoulder surgery.  He is interested in pursuing that route at this point given the failure of conservative treatment has continued pain.  The Neurontin increase has helped with some of his nerve symptoms but they have not dissipated completely.  HPI  Review of Systems He currently denies a headache, chest pain, shortness of breath, fever, chills, nausea, vomiting  Objective: Vital Signs: There were no vitals taken for this visit.  Physical Exam He is alert and oriented x3 and in no acute distress Ortho Exam Examination of his right shoulder shows significant signs of impingement. Specialty Comments:  No specialty comments available.  Imaging: No results found.   PMFS History: Patient Active Problem List   Diagnosis Date Noted  . SBO (small bowel obstruction) ([place]) 08/21/2017  . Trochanteric bursitis of left hip 01/18/2017  . Lateral epicondylitis, right elbow 01/18/2017  . Rheumatoid arthritis ([facility]) 03/15/2016  . DJD (degenerative joint disease), cervical 03/15/2016  . DDD (degenerative disc disease), lumbar 03/15/2016  . HTN (hypertension) 03/15/2016  . Obesity 03/15/2016  . Elevated cholesterol 03/15/2016  . Neuralgia 03/15/2016  . Malignant melanoma of right side of neck ([facility] [place]) 03/15/2016  . BBB (bundle branch block) 03/15/2016  . High risk medication use 03/15/2016  . Osteoarthritis of lumbar spine 03/15/2016  . Primary osteoarthritis of both hands 03/15/2016  . Primary osteoarthritis  of both knees 03/15/2016  . Mesenteric adenitis 06/29/2015   Past Medical History:  Diagnosis Date  . Arthritis   . BBB (bundle branch block) 03/15/2016  . Carpal tunnel syndrome   . DDD (degenerative disc disease), lumbar 03/15/2016  . Depression   . Dysrhythmia    had some tachycardia with steroids  . Elevated cholesterol 03/15/2016  . GERD (gastroesophageal reflux disease)    no meds since gastric banding  . Headache(784.0)    Ocular Migraines - takes Ambien for  it  . HTN (hypertension) 03/15/2016  . Hyperlipemia   . Hypertension   . Malignant melanoma of right side of neck ([facility]) 03/15/2016   30 years ago   . Neuralgia 03/15/2016  . Rheumatoid arthritis ([facility]) 03/15/2016   Sero Negative.   Marland Kitchen RLS (restless legs syndrome)   . Shortness of breath    DOE  . Sleep apnea    could not use a cpap  . Snores   . Status post gastric banding   . Wears glasses    driving    Family History  Problem Relation Age of Onset  . Heart disease Mother   . Alzheimer's disease Father     Past Surgical History:  Procedure Laterality Date  . CARPAL TUNNEL RELEASE Right 09/26/2012   Procedure: CARPAL TUNNEL RELEASE;  Surgeon: Tennis Must, MD;  Location: [facility];  Service: Orthopedics;  Laterality: Right;  . CARPAL TUNNEL RELEASE Left 10/24/2012   Procedure: CARPAL TUNNEL RELEASE;  Surgeon: Tennis Must, MD;  Location: [facility];  Service: Orthopedics;  Laterality: Left;  . EXCISION MELANOMA WITH SENTINEL LYMPH NODE BIOPSY  1984   rt neck with mulpipal nodes and some muscle excised rt neck-shoulder  . KNEE ARTHROPLASTY    . knee replacement    . LAPAROSCOPIC GASTRIC BANDING  03/15/2009  . LAPAROSCOPIC GASTRIC BANDING    . MUSCLE BIOPSY  2004   rt thigh to r/o myocitis  . nerve conduction velosity test    . TONSILLECTOMY    . TOTAL KNEE ARTHROPLASTY Bilateral 12/05/2010  . TRIGGER FINGER RELEASE Right 09/26/2012   Procedure: RELEASE TRIGGER FINGER/A-1 PULLEY RING AND SMALL ;  Surgeon: Tennis Must, MD;  Location: [facility];  Service: Orthopedics;  Laterality: Right;  . TRIGGER FINGER RELEASE Left 10/24/2012   Procedure: RELEASE TRIGGER FINGER/A-1 PULLEY LEFT MIDDLE AND LEFT RING;  Surgeon: Tennis Must, MD;  Location: [facility];  Service: Orthopedics;  Laterality: Left;   Social History   Occupational History  . Not on file  Tobacco Use  . Smoking status: Former Smoker    Packs/day: 1.00     Years: 30.00    Pack years: 30.00    Types: Cigarettes    Quit date: 09/23/2001    Years since quitting: 18.2  . Smokeless tobacco: Never Used  Vaping Use  . Vaping Use: Never used  Substance and Sexual Activity  . Alcohol use: Yes    Comment: rare  . Drug use: No  . Sexual activity: Not on file

## 2020-01-06 ENCOUNTER — Encounter: Payer: Self-pay | Admitting: Physician Assistant

## 2020-01-06 ENCOUNTER — Ambulatory Visit (INDEPENDENT_AMBULATORY_CARE_PROVIDER_SITE_OTHER): Payer: Medicare Other | Admitting: Physician Assistant

## 2020-01-06 ENCOUNTER — Encounter: Payer: Medicare Other | Admitting: Physical Therapy

## 2020-01-06 ENCOUNTER — Other Ambulatory Visit: Payer: Self-pay

## 2020-01-06 VITALS — BP 129/71 | HR 69 | Resp 18 | Ht 68.0 in | Wt 268.2 lb

## 2020-01-06 DIAGNOSIS — M62838 Other muscle spasm: Secondary | ICD-10-CM | POA: Diagnosis not present

## 2020-01-06 DIAGNOSIS — M0609 Rheumatoid arthritis without rheumatoid factor, multiple sites: Secondary | ICD-10-CM | POA: Diagnosis not present

## 2020-01-06 DIAGNOSIS — M19042 Primary osteoarthritis, left hand: Secondary | ICD-10-CM | POA: Diagnosis not present

## 2020-01-06 DIAGNOSIS — M5030 Other cervical disc degeneration, unspecified cervical region: Secondary | ICD-10-CM

## 2020-01-06 DIAGNOSIS — M5136 Other intervertebral disc degeneration, lumbar region: Secondary | ICD-10-CM | POA: Diagnosis not present

## 2020-01-06 DIAGNOSIS — Z96653 Presence of artificial knee joint, bilateral: Secondary | ICD-10-CM

## 2020-01-06 DIAGNOSIS — Z79899 Other long term (current) drug therapy: Secondary | ICD-10-CM | POA: Diagnosis not present

## 2020-01-06 DIAGNOSIS — Z85820 Personal history of malignant melanoma of skin: Secondary | ICD-10-CM

## 2020-01-06 DIAGNOSIS — M19041 Primary osteoarthritis, right hand: Secondary | ICD-10-CM

## 2020-01-06 DIAGNOSIS — M19012 Primary osteoarthritis, left shoulder: Secondary | ICD-10-CM | POA: Diagnosis not present

## 2020-01-06 DIAGNOSIS — M19011 Primary osteoarthritis, right shoulder: Secondary | ICD-10-CM | POA: Diagnosis not present

## 2020-01-06 DIAGNOSIS — M792 Neuralgia and neuritis, unspecified: Secondary | ICD-10-CM | POA: Diagnosis not present

## 2020-01-06 LAB — CBC WITH DIFFERENTIAL/PLATELET
Absolute Monocytes: 851 cells/uL (ref 200–950)
Basophils Absolute: 53 cells/uL (ref 0–200)
Basophils Relative: 0.8 %
Eosinophils Absolute: 172 cells/uL (ref 15–500)
Eosinophils Relative: 2.6 %
HCT: 45.3 % (ref 38.5–50.0)
Hemoglobin: 15.1 g/dL (ref 13.2–17.1)
Lymphs Abs: 1894 cells/uL (ref 850–3900)
MCH: 31.7 pg (ref 27.0–33.0)
MCHC: 33.3 g/dL (ref 32.0–36.0)
MCV: 95.2 fL (ref 80.0–100.0)
MPV: 9.6 fL (ref 7.5–12.5)
Monocytes Relative: 12.9 %
Neutro Abs: 3630 cells/uL (ref 1500–7800)
Neutrophils Relative %: 55 %
Platelets: 213 10*3/uL (ref 140–400)
RBC: 4.76 10*6/uL (ref 4.20–5.80)
RDW: 14.2 % (ref 11.0–15.0)
Total Lymphocyte: 28.7 %
WBC: 6.6 10*3/uL (ref 3.8–10.8)

## 2020-01-06 LAB — COMPLETE METABOLIC PANEL WITH GFR
AG Ratio: 2 (calc) (ref 1.0–2.5)
ALT: 24 U/L (ref 9–46)
AST: 27 U/L (ref 10–35)
Albumin: 4.2 g/dL (ref 3.6–5.1)
Alkaline phosphatase (APISO): 64 U/L (ref 35–144)
BUN: 20 mg/dL (ref 7–25)
CO2: 32 mmol/L (ref 20–32)
Calcium: 10.1 mg/dL (ref 8.6–10.3)
Chloride: 102 mmol/L (ref 98–110)
Creat: 1.12 mg/dL (ref 0.70–1.25)
GFR, Est African American: 78 mL/min/{1.73_m2} (ref >=60)
GFR, Est Non African American: 67 mL/min/{1.73_m2} (ref >=60)
Globulin: 2.1 g/dL (calc) (ref 1.9–3.7)
Glucose, Bld: 92 mg/dL (ref 65–99)
Potassium: 5.1 mmol/L (ref 3.5–5.3)
Sodium: 140 mmol/L (ref 135–146)
Total Bilirubin: 0.4 mg/dL (ref 0.2–1.2)
Total Protein: 6.3 g/dL (ref 6.1–8.1)

## 2020-01-06 NOTE — Patient Instructions (Signed)
Standing Labs We placed an order today for your standing lab work.   Please have your standing labs drawn in November and every 3 months   If possible, please have your labs drawn 2 weeks prior to your appointment so that the provider can discuss your results at your appointment.  We have open lab daily Monday through Thursday from 8:30-12:30 PM and 1:30-4:30 PM and Friday from 8:30-12:30 PM and 1:30-4:00 PM at the office of Dr. Bo Merino, [facility].   Please be advised, patients with office appointments requiring lab work will take precedents over walk-in lab work.  If possible, please come for your lab work on Monday and Friday afternoons, as you may experience shorter wait times. The office is located at [address] [place], [place], [place] [address] No appointment is necessary.   Labs are drawn by [facility]. Please bring your co-pay at the time of your lab draw.  You may receive a bill from [facility] for your lab work.  If you wish to have your labs drawn at another location, please call the office 24 hours in advance to send orders.  If you have any questions regarding directions or hours of operation,  please call [phone].   As a reminder, please drink plenty of water prior to coming for your lab work. Thanks!  COVID-19 vaccine recommendations:   COVID-19 vaccine is recommended for everyone (unless you are allergic to a vaccine component), even if you are on a medication that suppresses your immune system.   If you are on Methotrexate, Cellcept (mycophenolate), Rinvoq, Morrie Sheldon, and Olumiant- hold the medication for 1 week after each vaccine. Hold Methotrexate for 2 weeks after the single dose COVID-19 vaccine.   If you are on Orencia subcutaneous injection - hold medication one week prior to and one week after the first COVID-19 vaccine dose (only).   If you are on Orencia IV infusions- time vaccination administration so that the first COVID-19  vaccination will occur four weeks after the infusion and postpone the subsequent infusion by one week.   If you are on Cyclophosphamide or Rituxan infusions please contact your doctor prior to receiving the COVID-19 vaccine.   Do not take Tylenol or ant anti-inflammatory medications (NSAIDs) 24 hours prior to the COVID-19 vaccination.   There is no direct evidence about the efficacy of the COVID-19 vaccine in individuals who are on medications that suppress the immune system.   Even if you are fully vaccinated, and you are on any medications that suppress your immune system, please continue to wear a mask, maintain at least six feet social distance and practice hand hygiene.   If you develop a COVID-19 infection, please contact your PCP or our office to determine if you need antibody infusion.  The booster vaccine is now available for immunocompromised patients. It is advised that if you had Pfizer vaccine you should get [employer]a booster.  If you had a Moderna vaccine then you should get a Moderna booster. Johnson and Wynetta Emery does not have a booster vaccine at this time.  Please see the following web sites for updated information.   https://www.[email or web address].org/Portals/0/Files/COVID-19-Vaccination-Patient-Resources.pdf  https://www.[email or web address].org/About-Us/Newsroom/Press-Releases/ID/1159

## 2020-01-07 NOTE — Progress Notes (Signed)
CBC and CMP WNL

## 2020-01-12 ENCOUNTER — Other Ambulatory Visit: Payer: Self-pay | Admitting: Neurosurgery

## 2020-01-13 ENCOUNTER — Encounter: Payer: Medicare Other | Admitting: Physical Therapy

## 2020-01-15 ENCOUNTER — Encounter: Payer: Medicare Other | Admitting: Physical Therapy

## 2020-01-20 ENCOUNTER — Encounter: Payer: Medicare Other | Admitting: Physical Therapy

## 2020-01-22 ENCOUNTER — Encounter: Payer: Medicare Other | Admitting: Physical Therapy

## 2020-01-26 ENCOUNTER — Other Ambulatory Visit: Payer: Self-pay | Admitting: Physician Assistant

## 2020-01-26 DIAGNOSIS — M0609 Rheumatoid arthritis without rheumatoid factor, multiple sites: Secondary | ICD-10-CM

## 2020-01-26 DIAGNOSIS — Z79899 Other long term (current) drug therapy: Secondary | ICD-10-CM

## 2020-01-27 NOTE — Telephone Encounter (Signed)
Last Visit: 01/06/2020 Next Visit: 06/08/2020 Labs: 01/06/2020 CBC and CMP WNL Eye exam:  11/15/18 WNL  PLQ eye exam scheduled for 02/11/2020.   Current Dose per office note 01/06/2020: Plaquenil 200 mg 1 tablet twice daily DX: Rheumatoid arthritis of multiple sites with negative rheumatoid factor   Okay to refill per Dr. Estanislado Pandy

## 2020-02-06 ENCOUNTER — Other Ambulatory Visit: Payer: Self-pay | Admitting: Rheumatology

## 2020-02-06 DIAGNOSIS — Z79899 Other long term (current) drug therapy: Secondary | ICD-10-CM

## 2020-02-06 NOTE — Telephone Encounter (Signed)
Last Visit: 01/06/2020 Next Visit: 06/08/2020 Labs: 01/06/2020 CBC and CMP WNL  Current Dose per office note 01/06/2020: Sulfasalazine 500 mg 2 tablets twice daily DX: Rheumatoid arthritis of multiple sites with negative rheumatoid factor  Okay to refill Sulfasalazine?

## 2020-02-11 DIAGNOSIS — H5213 Myopia, bilateral: Secondary | ICD-10-CM | POA: Diagnosis not present

## 2020-02-11 DIAGNOSIS — Z79899 Other long term (current) drug therapy: Secondary | ICD-10-CM | POA: Diagnosis not present

## 2020-02-11 DIAGNOSIS — H353130 Nonexudative age-related macular degeneration, bilateral, stage unspecified: Secondary | ICD-10-CM | POA: Diagnosis not present

## 2020-02-11 DIAGNOSIS — H524 Presbyopia: Secondary | ICD-10-CM | POA: Diagnosis not present

## 2020-02-20 ENCOUNTER — Other Ambulatory Visit: Payer: Self-pay | Admitting: Neurosurgery

## 2020-02-23 ENCOUNTER — Telehealth: Payer: Self-pay

## 2020-02-23 MED ORDER — SULFASALAZINE 500 MG PO TABS: 1000.0000 mg | ORAL_TABLET | Freq: Two times a day (BID) | ORAL | 0 refills | Status: DC

## 2020-02-23 NOTE — Telephone Encounter (Signed)
Patient's wife Manuela Schwartz called stating she has tried every drug store in the [place] area and is unable to find Sulfasalazine EC tablets.  Manuela Schwartz states the pharmacies have the regular Sulfasalazine, but not EC tablets.  Manuela Schwartz states Dr. Estanislado Pandy either prescribes the regular Sulfasalazine or Selestino will not be able to take the medication at all.  Manuela Schwartz states he only has 2 days left of tablets.

## 2020-02-23 NOTE — Pre-Procedure Instructions (Signed)
David [name]  02/23/2020      [name] [facility] [place], [name] [address] Phone: [phone] Fax: [phone]  [name], [name] [address] Phone: [phone] Fax: [name] #[id number] - [name], [place] - [name] AT [name] [name] [name] [address] Phone: [phone] Fax: [phone]    Your procedure is scheduled on Oct. 7  Report to [facility] Entrance A at 5:30 A.M.  Call this number if you have problems the morning of surgery:  [phone]   Remember:  Do not eat or drink after midnight.      Take these medicines the morning of surgery with A SIP OF WATER :              Duloxetine (cymbalta)             Gabapentin (neurontin)             Hydroxychloroquine (plaquenil)             Metoprolol succinate(toprol-xl)             Simvastatin (zocor)             tamsulosin (flomax)             Sulfasalazine (azulfidine)                    7 days prior to surgery STOP taking any Aspirin (unless otherwise instructed by your surgeon), Aleve, Naproxen, Ibuprofen, Motrin, Advil, Goody's, BC's, all herbal medications, fish oil, and all vitamins.                Do not wear jewelry.  Do not wear lotions, powders, or perfumes, or deodorant.  Do not shave 48 hours prior to surgery.  Men may shave face and neck.  Do not bring valuables to the hospital.  [facility] is not responsible for any belongings or valuables.  Contacts, dentures or bridgework may not be worn into surgery.  Leave your suitcase in the car.  After surgery it may be brought to your room.  For patients admitted to the hospital, discharge time will be determined by your treatment team.  Patients discharged the day of surgery will not be allowed to drive home.     Special instructions:   [place]- Preparing For Surgery  Before surgery, you can play an important role. Because skin is not sterile, your skin needs to be as free of germs as possible. You can reduce the number of germs on your skin by washing with CHG (chlorahexidine gluconate) Soap before surgery.  CHG is an antiseptic cleaner which kills germs and bonds with the skin to continue killing germs even after washing.    Oral Hygiene is also important to reduce your risk of infection.  Remember - BRUSH YOUR TEETH THE MORNING OF SURGERY WITH YOUR REGULAR TOOTHPASTE  Please do not use if you have an allergy to CHG or antibacterial soaps. If your skin becomes reddened/irritated stop using the CHG.  Do not shave (including legs and underarms) for at least 48 hours prior to first CHG shower. It is OK to shave your face.  Please follow these instructions carefully.   1. Shower the [employer]  BEFORE SURGERY and the MORNING OF SURGERY with CHG.   2. If you chose to wash your hair, wash your hair first as usual with your normal shampoo.  3. After you shampoo, rinse your hair and body thoroughly to remove the shampoo.  4. Use CHG as you would any other liquid soap. You can apply CHG directly to the skin and wash gently with a scrungie or a clean washcloth.   5. Apply the CHG Soap to your body ONLY FROM THE NECK DOWN.  Do not use on open wounds or open sores. Avoid contact with your eyes, ears, mouth and genitals (private parts). Wash Face and genitals (private parts)  with your normal soap.  6. Wash thoroughly, paying special attention to the area where your surgery will be performed.  7. Thoroughly rinse your body with warm water from the neck down.  8. DO NOT shower/wash with your normal soap after using and rinsing off the CHG Soap.  9. Pat yourself dry with a CLEAN TOWEL.  10. Wear CLEAN PAJAMAS to bed the night before surgery, wear comfortable clothes the morning of surgery  11. Place  CLEAN SHEETS on your bed the night of your first shower and DO NOT SLEEP WITH PETS.    Day of Surgery:  Do not apply any deodorants/lotions.  Please wear clean clothes to the hospital/surgery center.   Remember to brush your teeth WITH YOUR REGULAR TOOTHPASTE.    Please read over the following fact sheets that you were given.

## 2020-02-23 NOTE — Telephone Encounter (Signed)
Last Visit: 01/06/2020 Next Visit: 06/08/2020 Labs: 01/06/2020 CBC and CMP WNL  Current Dose per office note 01/06/2020: Sulfasalazine 500 mg 2 tablets twice daily DX: Rheumatoid arthritis of multiple sites with negative rheumatoid factor  Okay to refill per Dr. Estanislado Pandy

## 2020-02-24 ENCOUNTER — Other Ambulatory Visit: Payer: Self-pay

## 2020-02-24 ENCOUNTER — Encounter (HOSPITAL_COMMUNITY)
Admission: RE | Admit: 2020-02-24 | Discharge: 2020-02-24 | Disposition: A | Discharge status: Home / Self Care | Payer: Medicare Other | Source: Ambulatory Visit | Attending: Neurosurgery | Admitting: Neurosurgery

## 2020-02-24 ENCOUNTER — Other Ambulatory Visit (HOSPITAL_COMMUNITY)
Admission: RE | Admit: 2020-02-24 | Discharge: 2020-02-24 | Disposition: A | Discharge status: Home / Self Care | Payer: Medicare Other | Source: Ambulatory Visit | Attending: Neurosurgery | Admitting: Neurosurgery

## 2020-02-24 ENCOUNTER — Encounter (HOSPITAL_COMMUNITY): Payer: Self-pay

## 2020-02-24 DIAGNOSIS — Z01812 Encounter for preprocedural laboratory examination: Secondary | ICD-10-CM | POA: Insufficient documentation

## 2020-02-24 DIAGNOSIS — M069 Rheumatoid arthritis, unspecified: Secondary | ICD-10-CM | POA: Diagnosis not present

## 2020-02-24 DIAGNOSIS — Z79899 Other long term (current) drug therapy: Secondary | ICD-10-CM | POA: Insufficient documentation

## 2020-02-24 DIAGNOSIS — G4733 Obstructive sleep apnea (adult) (pediatric): Secondary | ICD-10-CM | POA: Insufficient documentation

## 2020-02-24 DIAGNOSIS — Z01818 Encounter for other preprocedural examination: Secondary | ICD-10-CM | POA: Diagnosis not present

## 2020-02-24 DIAGNOSIS — M1990 Unspecified osteoarthritis, unspecified site: Secondary | ICD-10-CM | POA: Diagnosis not present

## 2020-02-24 DIAGNOSIS — Z20822 Contact with and (suspected) exposure to covid-19: Secondary | ICD-10-CM | POA: Diagnosis not present

## 2020-02-24 DIAGNOSIS — Z9989 Dependence on other enabling machines and devices: Secondary | ICD-10-CM | POA: Insufficient documentation

## 2020-02-24 HISTORY — DX: Cardiac murmur, unspecified: R01.1

## 2020-02-24 HISTORY — DX: Nonspecific reaction to tuberculin skin test without active tuberculosis: R76.11

## 2020-02-24 LAB — CBC
HCT: 47.1 % (ref 39.0–52.0)
Hemoglobin: 15.1 g/dL (ref 13.0–17.0)
MCH: 31.4 pg (ref 26.0–34.0)
MCHC: 32.1 g/dL (ref 30.0–36.0)
MCV: 97.9 fL (ref 80.0–100.0)
Platelets: 278 10*3/uL (ref 150–400)
RBC: 4.81 MIL/uL (ref 4.22–5.81)
RDW: 14.6 % (ref 11.5–15.5)
WBC: 8.2 10*3/uL (ref 4.0–10.5)
nRBC: 0 % (ref 0.0–0.2)

## 2020-02-24 LAB — BASIC METABOLIC PANEL
Anion gap: 10 (ref 5–15)
BUN: 21 mg/dL (ref 8–23)
CO2: 28 mmol/L (ref 22–32)
Calcium: 10 mg/dL (ref 8.9–10.3)
Chloride: 100 mmol/L (ref 98–111)
Creatinine, Ser: 1.08 mg/dL (ref 0.61–1.24)
GFR calc non Af Amer: >60 mL/min (ref >60)
Glucose, Bld: 103 mg/dL — ABNORMAL HIGH (ref 70–99)
Potassium: 4.6 mmol/L (ref 3.5–5.1)
Sodium: 138 mmol/L (ref 135–145)

## 2020-02-24 LAB — TYPE AND SCREEN
ABO/RH(D): A POS
Antibody Screen: NEGATIVE

## 2020-02-24 LAB — SURGICAL PCR SCREEN
MRSA, PCR: NEGATIVE
Staphylococcus aureus: POSITIVE — AB

## 2020-02-24 LAB — SARS CORONAVIRUS 2 (TAT 6-24 HRS): SARS Coronavirus 2: NEGATIVE

## 2020-02-24 NOTE — Progress Notes (Signed)
PCP - Christella Noa @ [facility] Cardiologist - na    Chest x-ray - na EKG - 02/24/20 Stress Test - 2012 ECHO - 2010 Cardiac Cath - na  Sleep Study - yes CPAP - no  Fasting Blood Sugar - na   Blood Thinner Instructions:  na Aspirin Instructions: na    COVID TEST-  02/24/20   Anesthesia review: ekg/murmur  Patient denies shortness of breath, fever, cough and chest pain at PAT appointment   All instructions explained to the patient, with a verbal understanding of the material. Patient agrees to go over the instructions while at home for a better understanding. Patient also instructed to self quarantine after being tested for COVID-19. The opportunity to ask questions was provided.

## 2020-02-25 MED ORDER — VANCOMYCIN HCL 1500 MG/300ML IV SOLN
1500.0000 mg | INTRAVENOUS | Status: AC
  Administered 2020-02-26: 1500 mg via INTRAVENOUS
  Filled 2020-02-25: qty 300

## 2020-02-25 NOTE — Anesthesia Preprocedure Evaluation (Addendum)
Anesthesia Evaluation  Patient identified by MRN, date of birth, ID band Patient awake    Reviewed: Allergy & Precautions, NPO status , Patient's Chart, lab work & pertinent test results, reviewed documented beta blocker date and time   Airway Mallampati: III  TM Distance: >3 FB Neck ROM: Full    Dental  (+) Teeth Intact, Dental Advisory Given   Pulmonary sleep apnea (unable to tolerate CPAP) , former smoker,  Quit smoking 2003, 30 pack year history    Pulmonary exam normal breath sounds clear to auscultation       Cardiovascular hypertension, Pt. on medications and Pt. on home beta blockers Normal cardiovascular exam Rhythm:Regular Rate:Normal  RBBB   Neuro/Psych  Headaches, PSYCHIATRIC DISORDERS Depression    GI/Hepatic negative GI ROS, Neg liver ROS, S/p gastric band   Endo/Other  Morbid obesityBMI 40  Renal/GU negative Renal ROS  negative genitourinary   Musculoskeletal  (+) Arthritis , Osteoarthritis and Rheumatoid disorders,    Abdominal (+) + obese,   Peds  Hematology negative hematology ROS (+) hct 47   Anesthesia Other Findings   Reproductive/Obstetrics negative OB ROS                           Anesthesia Physical Anesthesia Plan  ASA: III  Anesthesia Plan: General   Post-op Pain Management:    Induction: Intravenous  PONV Risk Score and Plan: 2 and Ondansetron, Dexamethasone, Midazolam and Treatment may vary due to age or medical condition  Airway Management Planned: Oral ETT  Additional Equipment: None  Intra-op Plan:   Post-operative Plan: Extubation in OR  Informed Consent: I have reviewed the patients History and Physical, chart, labs and discussed the procedure including the risks, benefits and alternatives for the proposed anesthesia with the patient or authorized representative who has indicated his/her understanding and acceptance.     Dental advisory  given  Plan Discussed with: CRNA  Anesthesia Plan Comments: ( )      Anesthesia Quick Evaluation

## 2020-02-25 NOTE — Progress Notes (Signed)
Anesthesia Chart Review:  Examined patient at PAT appointment due to history of possible heart murmur. He reports that he is previously been told by his rheumatologist that he has a very soft heart murmur. He says he is reported this to his PCP and his PCP did not appreciate murmur on exam. On auscultation today I also do not appreciate a heart murmur. Heart is regular rate and rhythm, no murmurs rubs gallops. Lungs are clear to auscultation bilaterally. He denies any cardiac history, denies any chest pain or shortness of breath with activity. His activity is somewhat limited currently by orthopedic pain. He has never had any previous problems with anesthesia.  Follows with rheumatology for history of rheumatoid arthritis and osteoarthritis, maintained on methotrexate, sulfasalazine, and Plaquenil. Last seen 01/06/2020 and discussed upcoming neck surgery. Discussed that he will hold medications 1 week prior to surgery.  OSA, unable to tolerate CPAP.  Preop labs reviewed, WNL.  EKG 02/24/2020: Normal sinus rhythm. Right bundle branch block. No significant change.   Wynonia Musty [facility] Short Stay Center/Anesthesiology Phone [phone] 02/25/2020 8:55 AM

## 2020-02-26 ENCOUNTER — Observation Stay (HOSPITAL_COMMUNITY)
Admission: RE | Admit: 2020-02-26 | Discharge: 2020-02-27 | Disposition: A | Discharge status: Home / Self Care | Payer: Medicare Other | Attending: Neurosurgery | Admitting: Neurosurgery

## 2020-02-26 ENCOUNTER — Inpatient Hospital Stay (HOSPITAL_COMMUNITY): Payer: Medicare Other | Admitting: Anesthesiology

## 2020-02-26 ENCOUNTER — Other Ambulatory Visit: Payer: Self-pay

## 2020-02-26 ENCOUNTER — Inpatient Hospital Stay (HOSPITAL_COMMUNITY): Payer: Medicare Other

## 2020-02-26 ENCOUNTER — Encounter (HOSPITAL_COMMUNITY): Payer: Self-pay | Admitting: Neurosurgery

## 2020-02-26 ENCOUNTER — Inpatient Hospital Stay (HOSPITAL_COMMUNITY): Payer: Medicare Other | Admitting: Physician Assistant

## 2020-02-26 ENCOUNTER — Inpatient Hospital Stay (HOSPITAL_COMMUNITY)
Admission: RE | Disposition: A | Discharge status: Home / Self Care | Payer: Self-pay | Source: Home / Self Care | Attending: Neurosurgery

## 2020-02-26 DIAGNOSIS — I10 Essential (primary) hypertension: Secondary | ICD-10-CM | POA: Insufficient documentation

## 2020-02-26 DIAGNOSIS — M4722 Other spondylosis with radiculopathy, cervical region: Secondary | ICD-10-CM | POA: Diagnosis not present

## 2020-02-26 DIAGNOSIS — Z87891 Personal history of nicotine dependence: Secondary | ICD-10-CM | POA: Insufficient documentation

## 2020-02-26 DIAGNOSIS — Z96653 Presence of artificial knee joint, bilateral: Secondary | ICD-10-CM | POA: Diagnosis not present

## 2020-02-26 DIAGNOSIS — E7800 Pure hypercholesterolemia, unspecified: Secondary | ICD-10-CM | POA: Diagnosis not present

## 2020-02-26 DIAGNOSIS — M4322 Fusion of spine, cervical region: Secondary | ICD-10-CM | POA: Diagnosis not present

## 2020-02-26 DIAGNOSIS — M542 Cervicalgia: Secondary | ICD-10-CM | POA: Diagnosis present

## 2020-02-26 DIAGNOSIS — M4802 Spinal stenosis, cervical region: Secondary | ICD-10-CM | POA: Diagnosis not present

## 2020-02-26 DIAGNOSIS — Z419 Encounter for procedure for purposes other than remedying health state, unspecified: Secondary | ICD-10-CM

## 2020-02-26 DIAGNOSIS — Z79899 Other long term (current) drug therapy: Secondary | ICD-10-CM | POA: Diagnosis not present

## 2020-02-26 DIAGNOSIS — Z981 Arthrodesis status: Secondary | ICD-10-CM | POA: Diagnosis not present

## 2020-02-26 HISTORY — PX: ANTERIOR CERVICAL DECOMPRESSION/DISCECTOMY FUSION 4 LEVELS: SHX5556

## 2020-02-26 SURGERY — ANTERIOR CERVICAL DECOMPRESSION/DISCECTOMY FUSION 4 LEVELS
Anesthesia: General

## 2020-02-26 MED ORDER — GABAPENTIN 300 MG PO CAPS
900.0000 mg | ORAL_CAPSULE | Freq: Three times a day (TID) | ORAL | Status: DC
  Administered 2020-02-26 (×2): 900 mg via ORAL
  Filled 2020-02-26 (×2): qty 3

## 2020-02-26 MED ORDER — MORPHINE SULFATE (PF) 4 MG/ML IV SOLN: 4.0000 mg | INTRAVENOUS | Status: DC | PRN

## 2020-02-26 MED ORDER — ACETAMINOPHEN 500 MG PO TABS
ORAL_TABLET | ORAL | Status: AC
  Administered 2020-02-26: 1000 mg via ORAL
  Filled 2020-02-26: qty 2

## 2020-02-26 MED ORDER — DULOXETINE HCL 30 MG PO CPEP
60.0000 mg | ORAL_CAPSULE | Freq: Two times a day (BID) | ORAL | Status: DC
  Administered 2020-02-26: 60 mg via ORAL
  Filled 2020-02-26: qty 2

## 2020-02-26 MED ORDER — KETOROLAC TROMETHAMINE 30 MG/ML IJ SOLN
30.0000 mg | Freq: Once | INTRAMUSCULAR | Status: AC | PRN
  Administered 2020-02-26: 30 mg via INTRAVENOUS

## 2020-02-26 MED ORDER — FENTANYL CITRATE (PF) 100 MCG/2ML IJ SOLN: 25.0000 ug | INTRAMUSCULAR | Status: DC | PRN

## 2020-02-26 MED ORDER — OXYCODONE HCL 5 MG/5ML PO SOLN: 5.0000 mg | Freq: Once | ORAL | Status: AC | PRN

## 2020-02-26 MED ORDER — KETOROLAC TROMETHAMINE 30 MG/ML IJ SOLN
INTRAMUSCULAR | Status: AC
  Filled 2020-02-26: qty 1

## 2020-02-26 MED ORDER — FOLIC ACID 1 MG PO TABS
2.0000 mg | ORAL_TABLET | Freq: Every day | ORAL | Status: DC
  Administered 2020-02-26: 2 mg via ORAL
  Filled 2020-02-26: qty 2

## 2020-02-26 MED ORDER — EPHEDRINE SULFATE 50 MG/ML IJ SOLN
INTRAMUSCULAR | Status: DC | PRN
  Administered 2020-02-26: 5 mg via INTRAVENOUS
  Administered 2020-02-26 (×3): 10 mg via INTRAVENOUS
  Administered 2020-02-26: 15 mg via INTRAVENOUS

## 2020-02-26 MED ORDER — BISACODYL 10 MG RE SUPP: 10.0000 mg | Freq: Every day | RECTAL | Status: DC | PRN

## 2020-02-26 MED ORDER — OXYCODONE HCL 5 MG PO TABS
ORAL_TABLET | ORAL | Status: AC
  Administered 2020-02-26: 5 mg via ORAL
  Filled 2020-02-26: qty 1

## 2020-02-26 MED ORDER — METOPROLOL SUCCINATE ER 25 MG PO TB24: 25.0000 mg | ORAL_TABLET | Freq: Every day | ORAL | Status: DC

## 2020-02-26 MED ORDER — FENTANYL CITRATE (PF) 100 MCG/2ML IJ SOLN
25.0000 ug | INTRAMUSCULAR | Status: DC | PRN
  Administered 2020-02-26 (×3): 50 ug via INTRAVENOUS

## 2020-02-26 MED ORDER — PANTOPRAZOLE SODIUM 40 MG IV SOLR: 40.0000 mg | Freq: Every day | INTRAVENOUS | Status: DC

## 2020-02-26 MED ORDER — MEPERIDINE HCL 25 MG/ML IJ SOLN: 6.2500 mg | INTRAMUSCULAR | Status: DC | PRN

## 2020-02-26 MED ORDER — ACETAMINOPHEN 325 MG PO TABS: 650.0000 mg | ORAL_TABLET | ORAL | Status: DC | PRN

## 2020-02-26 MED ORDER — ROCURONIUM BROMIDE 10 MG/ML (PF) SYRINGE
PREFILLED_SYRINGE | INTRAVENOUS | Status: AC
  Filled 2020-02-26: qty 10

## 2020-02-26 MED ORDER — PHENYLEPHRINE HCL (PRESSORS) 10 MG/ML IV SOLN
INTRAVENOUS | Status: DC | PRN
  Administered 2020-02-26: 120 ug via INTRAVENOUS
  Administered 2020-02-26 (×3): 40 ug via INTRAVENOUS

## 2020-02-26 MED ORDER — SIMVASTATIN 20 MG PO TABS: 40.0000 mg | ORAL_TABLET | Freq: Every day | ORAL | Status: DC

## 2020-02-26 MED ORDER — SODIUM CHLORIDE (PF) 0.9 % IJ SOLN
INTRAMUSCULAR | Status: AC
  Filled 2020-02-26: qty 10

## 2020-02-26 MED ORDER — CYCLOBENZAPRINE HCL 10 MG PO TABS
ORAL_TABLET | ORAL | Status: AC
  Administered 2020-02-26: 10 mg via ORAL
  Filled 2020-02-26: qty 1

## 2020-02-26 MED ORDER — LACTATED RINGERS IV SOLN: INTRAVENOUS | Status: DC

## 2020-02-26 MED ORDER — BUPIVACAINE-EPINEPHRINE (PF) 0.5% -1:200000 IJ SOLN
INTRAMUSCULAR | Status: DC | PRN
  Administered 2020-02-26: 10 mL via PERINEURAL

## 2020-02-26 MED ORDER — BACITRACIN ZINC 500 UNIT/GM EX OINT
TOPICAL_OINTMENT | CUTANEOUS | Status: AC
  Filled 2020-02-26: qty 28.35

## 2020-02-26 MED ORDER — THROMBIN 5000 UNITS EX SOLR
CUTANEOUS | Status: AC
  Filled 2020-02-26: qty 5000

## 2020-02-26 MED ORDER — ACETAMINOPHEN 500 MG PO TABS
1000.0000 mg | ORAL_TABLET | Freq: Four times a day (QID) | ORAL | Status: DC
  Administered 2020-02-26: 1000 mg via ORAL
  Filled 2020-02-26: qty 2

## 2020-02-26 MED ORDER — CHLORHEXIDINE GLUCONATE CLOTH 2 % EX PADS: 6.0000 | MEDICATED_PAD | Freq: Once | CUTANEOUS | Status: DC

## 2020-02-26 MED ORDER — ONDANSETRON HCL 4 MG/2ML IJ SOLN: 4.0000 mg | Freq: Four times a day (QID) | INTRAMUSCULAR | Status: DC | PRN

## 2020-02-26 MED ORDER — HYDROCHLOROTHIAZIDE 25 MG PO TABS
25.0000 mg | ORAL_TABLET | Freq: Every day | ORAL | Status: DC
  Administered 2020-02-26: 25 mg via ORAL
  Filled 2020-02-26: qty 1

## 2020-02-26 MED ORDER — EPHEDRINE 5 MG/ML INJ
INTRAVENOUS | Status: AC
  Filled 2020-02-26: qty 10

## 2020-02-26 MED ORDER — SULFASALAZINE 500 MG PO TABS
1000.0000 mg | ORAL_TABLET | Freq: Two times a day (BID) | ORAL | Status: DC
  Administered 2020-02-26: 1000 mg via ORAL
  Filled 2020-02-26 (×2): qty 2

## 2020-02-26 MED ORDER — ONDANSETRON HCL 4 MG/2ML IJ SOLN
INTRAMUSCULAR | Status: AC
  Filled 2020-02-26: qty 2

## 2020-02-26 MED ORDER — FENTANYL CITRATE (PF) 100 MCG/2ML IJ SOLN
INTRAMUSCULAR | Status: AC
  Administered 2020-02-26: 25 ug via INTRAVENOUS
  Filled 2020-02-26: qty 2

## 2020-02-26 MED ORDER — BACITRACIN ZINC 500 UNIT/GM EX OINT
TOPICAL_OINTMENT | CUTANEOUS | Status: DC | PRN
  Administered 2020-02-26: 1 via TOPICAL

## 2020-02-26 MED ORDER — DOCUSATE SODIUM 100 MG PO CAPS
100.0000 mg | ORAL_CAPSULE | Freq: Two times a day (BID) | ORAL | Status: DC
  Administered 2020-02-26: 100 mg via ORAL
  Filled 2020-02-26: qty 1

## 2020-02-26 MED ORDER — OXYCODONE-ACETAMINOPHEN 5-325 MG PO TABS
1.0000 | ORAL_TABLET | ORAL | Status: DC | PRN
  Administered 2020-02-26 – 2020-02-27 (×3): 2 via ORAL
  Filled 2020-02-26 (×3): qty 2

## 2020-02-26 MED ORDER — LACTATED RINGERS IV SOLN: INTRAVENOUS | Status: DC | PRN

## 2020-02-26 MED ORDER — PANTOPRAZOLE SODIUM 40 MG PO TBEC
40.0000 mg | DELAYED_RELEASE_TABLET | Freq: Every day | ORAL | Status: DC
  Administered 2020-02-26: 40 mg via ORAL
  Filled 2020-02-26: qty 1

## 2020-02-26 MED ORDER — PROPOFOL 10 MG/ML IV BOLUS
INTRAVENOUS | Status: DC | PRN
  Administered 2020-02-26: 200 mg via INTRAVENOUS

## 2020-02-26 MED ORDER — SUGAMMADEX SODIUM 200 MG/2ML IV SOLN
INTRAVENOUS | Status: DC | PRN
  Administered 2020-02-26: 200 mg via INTRAVENOUS

## 2020-02-26 MED ORDER — DEXAMETHASONE 4 MG PO TABS
4.0000 mg | ORAL_TABLET | Freq: Four times a day (QID) | ORAL | Status: AC
  Administered 2020-02-26: 4 mg via ORAL
  Filled 2020-02-26: qty 1

## 2020-02-26 MED ORDER — THROMBIN 5000 UNITS EX SOLR
OROMUCOSAL | Status: DC | PRN
  Administered 2020-02-26 (×2): 5 mL via TOPICAL

## 2020-02-26 MED ORDER — BUPIVACAINE-EPINEPHRINE 0.5% -1:200000 IJ SOLN
INTRAMUSCULAR | Status: AC
  Filled 2020-02-26: qty 1

## 2020-02-26 MED ORDER — MIDAZOLAM HCL 2 MG/2ML IJ SOLN
INTRAMUSCULAR | Status: DC | PRN
  Administered 2020-02-26: 2 mg via INTRAVENOUS

## 2020-02-26 MED ORDER — CYCLOBENZAPRINE HCL 10 MG PO TABS
10.0000 mg | ORAL_TABLET | Freq: Three times a day (TID) | ORAL | Status: DC | PRN
  Administered 2020-02-27: 10 mg via ORAL
  Filled 2020-02-26: qty 1

## 2020-02-26 MED ORDER — LIDOCAINE 2% (20 MG/ML) 5 ML SYRINGE
INTRAMUSCULAR | Status: AC
  Filled 2020-02-26: qty 5

## 2020-02-26 MED ORDER — 0.9 % SODIUM CHLORIDE (POUR BTL) OPTIME
TOPICAL | Status: DC | PRN
  Administered 2020-02-26: 1000 mL

## 2020-02-26 MED ORDER — PROPOFOL 10 MG/ML IV BOLUS
INTRAVENOUS | Status: AC
  Filled 2020-02-26: qty 20

## 2020-02-26 MED ORDER — PROMETHAZINE HCL 25 MG/ML IJ SOLN: 6.2500 mg | INTRAMUSCULAR | Status: DC | PRN

## 2020-02-26 MED ORDER — LIDOCAINE HCL (CARDIAC) PF 100 MG/5ML IV SOSY
PREFILLED_SYRINGE | INTRAVENOUS | Status: DC | PRN
  Administered 2020-02-26: 60 mg via INTRAVENOUS

## 2020-02-26 MED ORDER — ORAL CARE MOUTH RINSE: 15.0000 mL | Freq: Once | OROMUCOSAL | Status: AC

## 2020-02-26 MED ORDER — ACETAMINOPHEN 500 MG PO TABS
1000.0000 mg | ORAL_TABLET | Freq: Once | ORAL | Status: AC
  Administered 2020-02-26: 1000 mg via ORAL
  Filled 2020-02-26: qty 2

## 2020-02-26 MED ORDER — ROPINIROLE HCL 1 MG PO TABS
4.0000 mg | ORAL_TABLET | Freq: Every evening | ORAL | Status: DC
  Administered 2020-02-26: 4 mg via ORAL
  Filled 2020-02-26: qty 4

## 2020-02-26 MED ORDER — TAMSULOSIN HCL 0.4 MG PO CAPS: 0.4000 mg | ORAL_CAPSULE | Freq: Every day | ORAL | Status: DC

## 2020-02-26 MED ORDER — HYDROXYCHLOROQUINE SULFATE 200 MG PO TABS
200.0000 mg | ORAL_TABLET | Freq: Two times a day (BID) | ORAL | Status: DC
  Administered 2020-02-26: 200 mg via ORAL
  Filled 2020-02-26 (×2): qty 1

## 2020-02-26 MED ORDER — VANCOMYCIN HCL IN DEXTROSE 750-5 MG/150ML-% IV SOLN
750.0000 mg | Freq: Two times a day (BID) | INTRAVENOUS | Status: DC
  Administered 2020-02-26 – 2020-02-27 (×2): 750 mg via INTRAVENOUS
  Filled 2020-02-26 (×2): qty 150

## 2020-02-26 MED ORDER — CHLORHEXIDINE GLUCONATE 0.12 % MT SOLN
15.0000 mL | Freq: Once | OROMUCOSAL | Status: AC
  Administered 2020-02-26: 15 mL via OROMUCOSAL
  Filled 2020-02-26: qty 15

## 2020-02-26 MED ORDER — PHENOL 1.4 % MT LIQD: 1.0000 | OROMUCOSAL | Status: DC | PRN

## 2020-02-26 MED ORDER — MIDAZOLAM HCL 2 MG/2ML IJ SOLN
INTRAMUSCULAR | Status: AC
  Filled 2020-02-26: qty 2

## 2020-02-26 MED ORDER — ROCURONIUM 10MG/ML (10ML) SYRINGE FOR MEDFUSION PUMP - OPTIME
INTRAVENOUS | Status: DC | PRN
  Administered 2020-02-26: 100 mg via INTRAVENOUS

## 2020-02-26 MED ORDER — VANCOMYCIN HCL 1500 MG/300ML IV SOLN: 1500.0000 mg | INTRAVENOUS | Status: DC

## 2020-02-26 MED ORDER — ACETAMINOPHEN 650 MG RE SUPP: 650.0000 mg | RECTAL | Status: DC | PRN

## 2020-02-26 MED ORDER — ALUM & MAG HYDROXIDE-SIMETH 200-200-20 MG/5ML PO SUSP: 30.0000 mL | Freq: Four times a day (QID) | ORAL | Status: DC | PRN

## 2020-02-26 MED ORDER — DEXAMETHASONE SODIUM PHOSPHATE 10 MG/ML IJ SOLN
INTRAMUSCULAR | Status: AC
  Filled 2020-02-26: qty 1

## 2020-02-26 MED ORDER — DEXAMETHASONE SODIUM PHOSPHATE 10 MG/ML IJ SOLN
INTRAMUSCULAR | Status: DC | PRN
  Administered 2020-02-26: 10 mg via INTRAVENOUS

## 2020-02-26 MED ORDER — ONDANSETRON HCL 4 MG PO TABS: 4.0000 mg | ORAL_TABLET | Freq: Four times a day (QID) | ORAL | Status: DC | PRN

## 2020-02-26 MED ORDER — DEXAMETHASONE SODIUM PHOSPHATE 4 MG/ML IJ SOLN
4.0000 mg | Freq: Four times a day (QID) | INTRAMUSCULAR | Status: AC
  Administered 2020-02-26: 4 mg via INTRAVENOUS
  Filled 2020-02-26: qty 1

## 2020-02-26 MED ORDER — FENTANYL CITRATE (PF) 100 MCG/2ML IJ SOLN
INTRAMUSCULAR | Status: AC
  Filled 2020-02-26: qty 2

## 2020-02-26 MED ORDER — SUFENTANIL CITRATE 50 MCG/ML IV SOLN
INTRAVENOUS | Status: AC
Start: 2020-02-26 — End: ?
  Filled 2020-02-26: qty 1

## 2020-02-26 MED ORDER — PHENYLEPHRINE HCL-NACL 20-0.9 MG/250ML-% IV SOLN
INTRAVENOUS | Status: DC | PRN
  Administered 2020-02-26: 50 ug/min via INTRAVENOUS

## 2020-02-26 MED ORDER — SUFENTANIL CITRATE 50 MCG/ML IV SOLN
INTRAVENOUS | Status: DC | PRN
Start: 2020-02-26 — End: 2020-02-26
  Administered 2020-02-26: 10 ug via INTRAVENOUS
  Administered 2020-02-26: 20 ug via INTRAVENOUS

## 2020-02-26 MED ORDER — MENTHOL 3 MG MT LOZG: 1.0000 | LOZENGE | OROMUCOSAL | Status: DC | PRN

## 2020-02-26 MED ORDER — ONDANSETRON HCL 4 MG/2ML IJ SOLN
INTRAMUSCULAR | Status: DC | PRN
  Administered 2020-02-26: 4 mg via INTRAVENOUS

## 2020-02-26 MED ORDER — OXYCODONE HCL 5 MG PO TABS: 5.0000 mg | ORAL_TABLET | Freq: Once | ORAL | Status: AC | PRN

## 2020-02-26 MED ORDER — PHENYLEPHRINE HCL (PRESSORS) 10 MG/ML IV SOLN
INTRAVENOUS | Status: AC
  Filled 2020-02-26: qty 2

## 2020-02-26 MED ORDER — PHENYLEPHRINE 40 MCG/ML (10ML) SYRINGE FOR IV PUSH (FOR BLOOD PRESSURE SUPPORT)
PREFILLED_SYRINGE | INTRAVENOUS | Status: AC
  Filled 2020-02-26: qty 20

## 2020-02-26 SURGICAL SUPPLY — 65 items
BAG DECANTER FOR FLEXI CONT (MISCELLANEOUS) ×2 IMPLANT
BAND RUBBER #18 3X1/16 STRL (MISCELLANEOUS) IMPLANT
BASKET BONE COLLECTION (BASKET) ×2 IMPLANT
BENZOIN TINCTURE PRP APPL 2/3 (GAUZE/BANDAGES/DRESSINGS) ×2 IMPLANT
BIT DRILL NEURO 2X3.1 SFT TUCH (MISCELLANEOUS) ×1 IMPLANT
BLADE SURG 15 STRL LF DISP TIS (BLADE) ×1 IMPLANT
BLADE SURG 15 STRL SS (BLADE) ×2
BLADE ULTRA TIP 2M (BLADE) ×2 IMPLANT
BUR BARREL STRAIGHT FLUTE 4.0 (BURR) ×2 IMPLANT
BUR MATCHSTICK NEURO 3.0 LAGG (BURR) ×4 IMPLANT
CANISTER SUCT 3000ML PPV (MISCELLANEOUS) ×2 IMPLANT
CARTRIDGE OIL MAESTRO DRILL (MISCELLANEOUS) ×1 IMPLANT
COVER MAYO STAND STRL (DRAPES) ×2 IMPLANT
COVER WAND RF STERILE (DRAPES) ×2 IMPLANT
DECANTER SPIKE VIAL GLASS SM (MISCELLANEOUS) ×2 IMPLANT
DEVICE FUSION VIST S 14X14X5MM (Cage) ×2 IMPLANT
DEVICE FUSION VIST S 14X14X6MM (Trauma) ×1 IMPLANT
DIFFUSER DRILL AIR PNEUMATIC (MISCELLANEOUS) ×2 IMPLANT
DRAIN JACKSON PRATT 10MM FLAT (MISCELLANEOUS) ×2 IMPLANT
DRAPE LAPAROTOMY 100X72 PEDS (DRAPES) ×2 IMPLANT
DRAPE MICROSCOPE LEICA (MISCELLANEOUS) IMPLANT
DRAPE SURG 17X23 STRL (DRAPES) ×4 IMPLANT
DRILL NEURO 2X3.1 SOFT TOUCH (MISCELLANEOUS) ×2
DRSG OPSITE POSTOP 3X4 (GAUZE/BANDAGES/DRESSINGS) IMPLANT
DRSG OPSITE POSTOP 4X6 (GAUZE/BANDAGES/DRESSINGS) ×2 IMPLANT
ELECT REM PT RETURN 9FT ADLT (ELECTROSURGICAL) ×2
ELECTRODE REM PT RTRN 9FT ADLT (ELECTROSURGICAL) ×1 IMPLANT
EVACUATOR SILICONE 100CC (DRAIN) ×2 IMPLANT
GAUZE 4X4 16PLY RFD (DISPOSABLE) ×2 IMPLANT
GLOVE BIO SURGEON STRL SZ8 (GLOVE) ×2 IMPLANT
GLOVE BIO SURGEON STRL SZ8.5 (GLOVE) ×2 IMPLANT
GLOVE EXAM NITRILE XL STR (GLOVE) IMPLANT
GOWN STRL REUS W/ TWL LRG LVL3 (GOWN DISPOSABLE) ×1 IMPLANT
GOWN STRL REUS W/ TWL XL LVL3 (GOWN DISPOSABLE) ×1 IMPLANT
GOWN STRL REUS W/TWL LRG LVL3 (GOWN DISPOSABLE) ×2
GOWN STRL REUS W/TWL XL LVL3 (GOWN DISPOSABLE) ×2
HEMOSTAT POWDER KIT SURGIFOAM (HEMOSTASIS) ×4 IMPLANT
KIT BASIN OR (CUSTOM PROCEDURE TRAY) ×2 IMPLANT
KIT TURNOVER KIT B (KITS) ×2 IMPLANT
MARKER SKIN DUAL TIP RULER LAB (MISCELLANEOUS) ×2 IMPLANT
NEEDLE HYPO 22GX1.5 SAFETY (NEEDLE) ×2 IMPLANT
NEEDLE SPNL 18GX3.5 QUINCKE PK (NEEDLE) ×2 IMPLANT
NS IRRIG 1000ML POUR BTL (IV SOLUTION) ×2 IMPLANT
OIL CARTRIDGE MAESTRO DRILL (MISCELLANEOUS) ×2
PACK LAMINECTOMY NEURO (CUSTOM PROCEDURE TRAY) ×2 IMPLANT
PATTIES SURGICAL .5 X.5 (GAUZE/BANDAGES/DRESSINGS) ×2 IMPLANT
PATTIES SURGICAL 1X1 (DISPOSABLE) ×2 IMPLANT
PEEK VISTA 14X14X7MM (Peek) ×2 IMPLANT
PIN DISTRACTION 14MM (PIN) ×4 IMPLANT
PLATE ANT CERV XTEND 4 LV 66 (Plate) ×2 IMPLANT
PUTTY DBM 5CC CALC GRAN ×2 IMPLANT
SCREW XTD VAR 4.2 SELF TAP (Screw) ×20 IMPLANT
SPONGE INTESTINAL PEANUT (DISPOSABLE) ×6 IMPLANT
SPONGE SURGIFOAM ABS GEL 100 (HEMOSTASIS) IMPLANT
STRIP CLOSURE SKIN 1/2X4 (GAUZE/BANDAGES/DRESSINGS) ×2 IMPLANT
SUT VIC AB 0 CT1 27 (SUTURE) ×2
SUT VIC AB 0 CT1 27XBRD ANTBC (SUTURE) ×1 IMPLANT
SUT VIC AB 3-0 SH 8-18 (SUTURE) ×2 IMPLANT
TOWEL GREEN STERILE (TOWEL DISPOSABLE) ×2 IMPLANT
TOWEL GREEN STERILE FF (TOWEL DISPOSABLE) ×2 IMPLANT
TRAY FOLEY W/BAG SLVR 16FR (SET/KITS/TRAYS/PACK) ×2
TRAY FOLEY W/BAG SLVR 16FR ST (SET/KITS/TRAYS/PACK) ×1 IMPLANT
VISTA S 14X14X5MM (Cage) ×4 IMPLANT
VISTA S O 14X14X6MM (Trauma) ×2 IMPLANT
WATER STERILE IRR 1000ML POUR (IV SOLUTION) ×2 IMPLANT

## 2020-02-26 NOTE — Transfer of Care (Signed)
Immediate Anesthesia Transfer of Care Note  Patient: [name]  Procedure(s) Performed: ANTERIOR CERVICAL DECOMPRESSION/DISCECTOMY FUSION, INTERBODY PROSTHESIS, PLATE/SCREWS CERVICAL THREE-FOUR CERVICAL FOUR-FIVE CERVICAL FIVE-SIX- CERVICAL SIX-SEVEN (N/A )  Patient Location: PACU  Anesthesia Type:General  Level of Consciousness: drowsy, patient cooperative and responds to stimulation  Airway & Oxygen Therapy: Patient Spontanous Breathing and Patient connected to nasal cannula oxygen  Post-op Assessment: Report given to RN, Post -op Vital signs reviewed and stable and Patient moving all extremities X 4  Post vital signs: Reviewed and stable  Last Vitals:  Vitals Value Taken Time  BP 132/82 02/26/20 1233  Temp    Pulse 108 02/26/20 1236  Resp 25 02/26/20 1236  SpO2 90 % 02/26/20 1236  Vitals shown include unvalidated device data.  Last Pain:  Vitals:   02/26/20 0638  TempSrc: Oral  PainSc: 6       Patients Stated Pain Goal: 3 (41/74/08 1448)  Complications: No complications documented.

## 2020-02-26 NOTE — Op Note (Signed)
Brief history: The patient is a 69 year old white male who has complained of neck and right greater left shoulder and arm pain consistent with a cervical radiculopathy.  He has failed medical management and was worked up with a cervical MRI which demonstrated multilevel spondylosis and neural foraminal stenosis.  I discussed the various treatment options with him.  He has decided to proceed with surgery after weighing the risk, benefits and alternatives.  Preoperative diagnosis: Cervical spondylosis with radiculopathy, cervicalgia  Postoperative diagnosis: The same  Procedure: C3-4, C4-5, C5-6 and C6-7 anterior cervical discectomy/decompression; C3-4, C4-5, C5-6 and C6-7 interbody arthrodesis with local morcellized autograft bone and Zimmer DBM; insertion of interbody prosthesis at C3-4, C4-5, C5-6 and C6-7 (Zimmer peek interbody prosthesis); anterior cervical plating from C3-C7 with globus titanium plate  Surgeon: Dr. Earle Gell  Asst.: Dr. Pieter Partridge Dawley and Arnetha Massy, NP  Anesthesia: Gen. endotracheal  Estimated blood loss: 200 cc  Drains: One 10 mm flat Jackson-Pratt drain in the prevertebral space  Complications: None  Description of procedure: The patient was brought to the operating room by the anesthesia team. General endotracheal anesthesia was induced. A roll was placed under the patient's shoulders to keep the neck in the neutral position. The patient's anterior cervical region was then prepared with Betadine scrub and Betadine solution. Sterile drapes were applied.  The area to be incised was then injected with Marcaine with epinephrine solution. I then used a scalpel to make a transverse incision in the patient's left anterior neck. I used the Metzenbaum scissors to divide the platysmal muscle and then to dissect medial to the sternocleidomastoid muscle, jugular vein, and carotid artery. I carefully dissected down towards the anterior cervical spine identifying the esophagus and  retracting it medially. Then using Kitner swabs to clear soft tissue from the anterior cervical spine. We then inserted a bent spinal needle into the upper exposed intervertebral disc space. We then obtained intraoperative radiographs confirm our location.  I then used electrocautery to detach the medial border of the longus colli muscle bilaterally from the C3-4, C4-5, C5-6 and C6-7 intervertebral disc spaces. I then inserted the Caspar self-retaining retractor underneath the longus colli muscle bilaterally to provide exposure.  We then incised the intervertebral disc at C3-4. We then performed a partial intervertebral discectomy with a pituitary forceps and the Karlin curettes. I then inserted distraction screws into the vertebral bodies at C3 and C4. We then distracted the interspace. We then used the high-speed drill to decorticate the vertebral endplates at U2-3, to drill away the remainder of the intervertebral disc, to drill away some posterior spondylosis, and to thin out the posterior longitudinal ligament. I then incised ligament with the arachnoid knife. We then removed the ligament with a Kerrison punches undercutting the vertebral endplates and decompressing the thecal sac. We then performed foraminotomies about the bilateral C4 nerve roots. This completed the decompression at this level.  We then repeated this procedure in analogous fashion at C4-5, C5-6 and C6-7 decompressing the thecal sac and the bilateral C5, C6 and C7 nerve roots.  We now turned our to attention to the interbody fusion. We used the trial spacers to determine the appropriate size for the interbody prosthesis. We then pre-filled prosthesis with a combination of local morcellized autograft bone that we obtained during decompression as well as Zimmer DBM. We then inserted the prosthesis into the distracted interspace at C3-4, C4-5, C5-6 and C6-7. We then removed the distraction screws. There was a good snug fit of the  prosthesis in the interspace.  Having completed the fusion we now turned attention to the anterior spinal instrumentation. We used the high-speed drill to drill away some anterior spondylosis at the disc spaces so that the plate lay down flat. We selected the appropriate length titanium anterior cervical plate. We laid it along the anterior aspect of the vertebral bodies from C3-C7. We then drilled 14 mm holes at C3, C4, C5, C6 and C7. We then secured the plate to the vertebral bodies by placing two 14 mm self-tapping screws at C3, C4, C5, C6 and C7. We then obtained intraoperative radiograph. The demonstrating good position of the instrumentation. We therefore secured the screws the plate the locking each cam. This completed the instrumentation.  We then obtained hemostasis using bipolar electrocautery. We irrigated the wound out with bacitracin solution. We then removed the retractor. We inspected the esophagus for any damage. There was none apparent.  We placed a 10 mm flat Jackson-Pratt drain in the prevertebral space and tunneled it out through a separate stab wound.  We then reapproximated patient's platysmal muscle with interrupted 3-0 Vicryl suture. We then reapproximated the subcutaneous tissue with interrupted 3-0 Vicryl suture. The skin was reapproximated with Steri-Strips and benzoin. The wound was then covered with bacitracin ointment. A sterile dressing was applied. The drapes were removed. Patient was subsequently extubated by the anesthesia team and transported to the post anesthesia care unit in stable condition. All sponge instrument and needle counts were reportedly correct at the end of this case.

## 2020-02-26 NOTE — Anesthesia Postprocedure Evaluation (Signed)
Anesthesia Post Note  Patient: David [name]  Procedure(s) Performed: ANTERIOR CERVICAL DECOMPRESSION/DISCECTOMY FUSION, INTERBODY PROSTHESIS, PLATE/SCREWS CERVICAL THREE-FOUR CERVICAL FOUR-FIVE CERVICAL FIVE-SIX- CERVICAL SIX-SEVEN (N/A )     Patient location during evaluation: PACU Anesthesia Type: General Level of consciousness: awake and alert, oriented and patient cooperative Pain management: pain level controlled Vital Signs Assessment: post-procedure vital signs reviewed and stable Respiratory status: spontaneous breathing, nonlabored ventilation and respiratory function stable Cardiovascular status: blood pressure returned to baseline and stable Postop Assessment: no apparent nausea or vomiting Anesthetic complications: no   No complications documented.  Last Vitals:  Vitals:   02/26/20 0638 02/26/20 1233  BP: (!) 141/82 132/82  Pulse: 71 100  Resp: 18 15  Temp: 36.8 C 36.6 C  SpO2: 92% 92%    Last Pain:  Vitals:   02/26/20 3383  TempSrc: Oral  PainSc: [place]

## 2020-02-26 NOTE — Progress Notes (Signed)
Pharmacy Antibiotic Note  David [name] is a 69 y.o. male admitted on 02/26/2020 with multi-level spondylosis and foraminal stenosis for surgery with surgical prophylaxis.  Pharmacy has been consulted for Vancomycin dosing for post-op surgical prophylaxis.  Patient received Vancomycin 1500mg  IV x1 dose preoperatively at 06:58AM.  SCr was 1.08 on 10/5 - baseline for this patient per prior labs.  Normalized CrCl ~ 65-70 mL/min.  Patient has a closed system JP drain in place post-procedure.   Plan: Vancomycin 750mg  IV every 12 hours.  Follow-up clinical status,  any cultures, and for drain removal.   Height: 5\' 8"  (172.7 cm) Weight: 119.7 kg (263 lb 14.4 oz) IBW/kg (Calculated) : 68.4  Temp (24hrs), Avg:98.1 F (36.7 C), Min:97.9 F (36.6 C), Max:98.3 F (36.8 C)  Recent Labs  Lab 02/24/20 1030  WBC 8.2  CREATININE 1.08    Estimated Creatinine Clearance: 82.3 mL/min (by C-G formula based on SCr of 1.08 mg/dL).    Allergies  Allergen Reactions  . Codeine Itching  . Dilaudid [Hydromorphone Hcl] Itching  . Fentanyl Itching    Pt states Fentanyl patch causes itching  . Nucynta [Tapentadol] Other (See Comments)    MENTAL PROBLEMS  . Penicillins Itching    Has patient had a PCN reaction causing immediate rash, facial/tongue/throat swelling, SOB or lightheadedness with hypotension:unsure childhood reaction Has patient had a PCN reaction causing severe rash involving mucus membranes or skin necrosis:unsure Has patient had a PCN reaction that required hospitalization:No Has patient had a PCN reaction occurring within the last 10 years:NO If all of the above answers are "NO", then may proceed with Cephalosporin use.     Antimicrobials this admission: Vancomycin 10/7 >>  Dose adjustments this admission:  Microbiology results: None available  Thank you for allowing pharmacy to be a part of this patient's care.  Sloan Leiter, PharmD, BCPS, BCCCP Clinical  Pharmacist Please refer to [facility] for [place] numbers 02/26/2020 3:28 PM

## 2020-02-26 NOTE — H&P (Signed)
Subjective: The patient is a 69 year old white male who has complained of neck pain and right greater left shoulder and arm pain and weakness.  He has failed medical management.  He was worked up with a cervical MRI which demonstrated multilevel spondylosis and foraminal stenosis.  I discussed the various treatment options with him.  He has decided proceed with surgery.  Past Medical History:  Diagnosis Date  . Arthritis   . BBB (bundle branch block) 03/15/2016  . Carpal tunnel syndrome   . DDD (degenerative disc disease), lumbar 03/15/2016  . Depression   . Dysrhythmia    had some tachycardia with steroids  . Elevated cholesterol 03/15/2016  . Headache(784.0)    Ocular Migraines - takes Ambien for it  . Heart murmur   . HTN (hypertension) 03/15/2016  . Hyperlipemia   . Hypertension   . Malignant melanoma of right side of neck ([place]) 03/15/2016   30 years ago   . Neuralgia 03/15/2016  . 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 Rheumatoid arthritis ([place]) 03/15/2016   Sero Negative.   Marland Kitchen RLS (restless legs syndrome)   . Sleep apnea    could not use a cpap  . Snores   . Status post gastric banding   . Wears glasses    driving    Past Surgical History:  Procedure Laterality Date  . CARPAL TUNNEL RELEASE Right 09/26/2012   Procedure: CARPAL TUNNEL RELEASE;  Surgeon: Tennis Must, MD;  Location: [facility];  Service: Orthopedics;  Laterality: Right;  . CARPAL TUNNEL RELEASE Left 10/24/2012   Procedure: CARPAL TUNNEL RELEASE;  Surgeon: Tennis Must, MD;  Location: [place];  Service: Orthopedics;  Laterality: Left;  . EXCISION MELANOMA WITH SENTINEL LYMPH NODE BIOPSY  1984   rt neck with mulpipal nodes and some muscle excised rt neck-shoulder  . KNEE ARTHROPLASTY    . knee replacement    . LAPAROSCOPIC GASTRIC BANDING  03/15/2009  . LAPAROSCOPIC GASTRIC BANDING    . MUSCLE BIOPSY  2004   rt thigh to r/o myocitis  . nerve conduction  velosity test    . TONSILLECTOMY    . TOTAL KNEE ARTHROPLASTY Bilateral 12/05/2010  . TRIGGER FINGER RELEASE Right 09/26/2012   Procedure: RELEASE TRIGGER FINGER/A-1 PULLEY RING AND SMALL ;  Surgeon: Tennis Must, MD;  Location: [facility];  Service: Orthopedics;  Laterality: Right;  . TRIGGER FINGER RELEASE Left 10/24/2012   Procedure: RELEASE TRIGGER FINGER/A-1 PULLEY LEFT MIDDLE AND LEFT RING;  Surgeon: Tennis Must, MD;  Location: [facility];  Service: Orthopedics;  Laterality: Left;    Allergies  Allergen Reactions  . Codeine Itching  . Dilaudid [Hydromorphone Hcl] Itching  . Fentanyl Itching    Pt states Fentanyl patch causes itching  . Nucynta [Tapentadol]     MENTAL PROBLEMS  . Penicillins Itching    Has patient had a PCN reaction causing immediate rash, facial/tongue/throat swelling, SOB or lightheadedness with hypotension:unsure childhood reaction Has patient had a PCN reaction causing severe rash involving mucus membranes or skin necrosis:unsure Has patient had a PCN reaction that required hospitalization:No Has patient had a PCN reaction occurring within the last 10 years:NO If all of the above answers are "NO", then may proceed with Cephalosporin use.     Social History   Tobacco Use  . Smoking status: Former Smoker    Packs/day: 1.00    Years: 30.00  Pack years: 30.00    Types: Cigarettes    Quit date: 09/23/2001    Years since quitting: 18.4  . Smokeless tobacco: Never Used  Substance Use Topics  . Alcohol use: Not Currently    Comment: rare 2-3 per year    Family History  Problem Relation Age of Onset  . Heart disease Mother   . Alzheimer's disease Father    Prior to Admission medications   Medication Sig Start Date End Date Taking? Authorizing Provider  acetaminophen (TYLENOL) 500 MG tablet Take 1,000 mg by mouth at bedtime.   Yes [provider]  Aspirin-Acetaminophen-Caffeine (EXCEDRIN PO) Take 2 tablets by mouth  daily as needed (headache).    Yes [provider]  DULoxetine (CYMBALTA) 60 MG capsule Take 60 mg by mouth 2 (two) times daily.    Yes [provider]  folic acid (FOLVITE) 1 MG tablet TAKE TWO TABLETS BY MOUTH DAILY Patient taking differently: Take 2 mg by mouth daily.  06/23/19  Yes Deveshwar, Abel Presto, MD  gabapentin (NEURONTIN) 300 MG capsule Take 3 capsules (900 mg total) by mouth 3 (three) times daily. 10/30/19  Yes Mcarthur Rossetti, MD  hydrochlorothiazide (HYDRODIURIL) 25 MG tablet Take 25 mg by mouth daily.   Yes [provider]  hydroxychloroquine (PLAQUENIL) 200 MG tablet TAKE ONE TABLET BY MOUTH TWICE A DAY Patient taking differently: Take 200 mg by mouth 2 (two) times daily.  01/27/20  Yes Deveshwar, Abel Presto, MD  methotrexate 50 MG/2ML injection INJECT 1 ML UNDER THE SKIN ONCE WEEKLY Patient taking differently: Inject 25 mg into the skin once a week.  09/23/19  Yes Deveshwar, Abel Presto, MD  metoprolol succinate (TOPROL-XL) 25 MG 24 hr tablet Take 25 mg by mouth daily. 11/24/14  Yes [provider]  rOPINIRole (REQUIP) 4 MG tablet Take 4 mg by mouth every evening.    Yes [provider]  simvastatin (ZOCOR) 40 MG tablet Take 40 mg by mouth daily.   Yes [provider]  sulfaSALAzine (AZULFIDINE) 500 MG tablet Take 2 tablets (1,000 mg total) by mouth 2 (two) times daily. 02/23/20  Yes Deveshwar, Abel Presto, MD  tamsulosin (FLOMAX) 0.4 MG CAPS capsule Take 0.4 mg by mouth daily.    Yes [provider]  Tuberculin-Allergy Syringes 27G X 1/2" 1 ML MISC Patient to inject MTX once weekly 12/19/16   Bo Merino, MD     Review of Systems  Positive ROS: As above  All other systems have been reviewed and were otherwise negative with the exception of those mentioned in the HPI and as above.  Objective: Vital signs in last 24 hours: Temp:  [98.3 F (36.8 C)] 98.3 F (36.8 C) (10/07 1610) Pulse Rate:  [71] 71 (10/07 0638) Resp:   [18] 18 (10/07 0638) BP: (141)/(82) 141/82 (10/07 0638) SpO2:  [92 %] 92 % (10/07 9604) Weight:  [119.7 kg] 119.7 kg (10/07 5409) Estimated body mass index is 40.13 kg/m as calculated from the following:   Height as of this encounter: 5\' 8"  (1.727 m).   Weight as of this encounter: 119.7 kg.   General Appearance: Alert Head: Normocephalic, without obvious abnormality, atraumatic Eyes: PERRL, conjunctiva/corneas clear, EOM's intact,    Ears: Normal  Throat: Normal  Neck: Supple, Back: unremarkable Lungs: Clear to auscultation bilaterally, respirations unlabored Heart: Regular rate and rhythm, no murmur, rub or gallop Abdomen: Soft, non-tender Extremities: Extremities normal, atraumatic, no cyanosis or edema Skin: unremarkable  NEUROLOGIC:   Mental status: alert and oriented,Motor Exam -  grossly normal Sensory Exam - grossly normal Reflexes:  Coordination - grossly normal Gait - grossly normal Balance - grossly normal Cranial Nerves: I: smell Not tested  II: visual acuity  OS: Normal  OD: Normal   II: visual fields Full to confrontation  II: pupils Equal, round, reactive to light  III,VII: ptosis None  III,IV,VI: extraocular muscles  Full ROM  V: mastication Normal  V: facial light touch sensation  Normal  V,VII: corneal reflex  Present  VII: facial muscle function - upper  Normal  VII: facial muscle function - lower Normal  VIII: hearing Not tested  IX: soft palate elevation  Normal  IX,X: gag reflex Present  XI: trapezius strength  5/5  XI: sternocleidomastoid strength 5/5  XI: neck flexion strength  5/5  XII: tongue strength  Normal    Data Review Lab Results  Component Value Date   WBC 8.2 02/24/2020   HGB 15.1 02/24/2020   HCT 47.1 02/24/2020   MCV 97.9 02/24/2020   PLT 278 02/24/2020   Lab Results  Component Value Date   NA 138 02/24/2020   K 4.6 02/24/2020   CL 100 02/24/2020   CO2 28 02/24/2020   BUN 21 02/24/2020   CREATININE 1.08 02/24/2020    GLUCOSE 103 (H) 02/24/2020   Lab Results  Component Value Date   INR 0.9 09/14/2018    Assessment/Plan: Cervical spondylosis, cervicalgia, cervical radiculopathy, cervical myelopathy: I have discussed the situation with the patient.  I have reviewed his MRI scan with him and pointed out the abnormalities.  We have discussed the various treatment options including surgery.  I have described the surgical treatment option of a C3-4, C4-5, C5-6 and C6-7 anterior cervical discectomy, fusion and plating.  I have shown him surgical models.  I have given him a surgical pamphlet.  We have discussed the risks, benefits, alternatives, expected postoperative course, and likelihood of achieving our goals with surgery.  I have answered all his questions.  He has decided to proceed with surgery.   Ophelia Charter 02/26/2020 7:22 AM

## 2020-02-26 NOTE — Anesthesia Procedure Notes (Signed)
Procedure Name: Intubation Date/Time: 02/26/2020 7:40 AM Performed by: Claris Che, CRNA Pre-anesthesia Checklist: Patient identified, Emergency Drugs available, Suction available, Patient being monitored and Timeout performed Patient Re-evaluated:Patient Re-evaluated prior to induction Oxygen Delivery Method: Circle system utilized Preoxygenation: Pre-oxygenation with 100% oxygen Induction Type: IV induction and Cricoid Pressure applied Ventilation: Mask ventilation without difficulty Laryngoscope Size: Mac and 4 Grade View: Grade II Tube type: Oral Tube size: 7.5 mm Number of attempts: 1 Airway Equipment and Method: Stylet Placement Confirmation: ETT inserted through vocal cords under direct vision,  positive ETCO2 and breath sounds checked- equal and bilateral Secured at: 23 cm Tube secured with: Tape Dental Injury: Teeth and Oropharynx as per pre-operative assessment

## 2020-02-26 NOTE — Progress Notes (Signed)
Orthopedic Tech Progress Note Patient Details:  David [name] 04-13-1951 [id number] Patient has brace. Patient ID: David [name], male   DOB: March 12, 1951, 69 y.o.   MRN: [id number]   David [name] 02/26/2020, 4:45 PM

## 2020-02-27 ENCOUNTER — Encounter (HOSPITAL_COMMUNITY): Payer: Self-pay | Admitting: Neurosurgery

## 2020-02-27 DIAGNOSIS — I10 Essential (primary) hypertension: Secondary | ICD-10-CM | POA: Diagnosis not present

## 2020-02-27 DIAGNOSIS — Z96653 Presence of artificial knee joint, bilateral: Secondary | ICD-10-CM | POA: Diagnosis not present

## 2020-02-27 DIAGNOSIS — Z79899 Other long term (current) drug therapy: Secondary | ICD-10-CM | POA: Diagnosis not present

## 2020-02-27 DIAGNOSIS — M4722 Other spondylosis with radiculopathy, cervical region: Secondary | ICD-10-CM | POA: Diagnosis not present

## 2020-02-27 DIAGNOSIS — Z87891 Personal history of nicotine dependence: Secondary | ICD-10-CM | POA: Diagnosis not present

## 2020-02-27 MED ORDER — DOCUSATE SODIUM 100 MG PO CAPS: 100.0000 mg | ORAL_CAPSULE | Freq: Two times a day (BID) | ORAL | 0 refills | Status: DC

## 2020-02-27 MED ORDER — OXYCODONE-ACETAMINOPHEN 5-325 MG PO TABS: 1.0000 | ORAL_TABLET | ORAL | 0 refills | Status: DC | PRN

## 2020-02-27 MED ORDER — CYCLOBENZAPRINE HCL 10 MG PO TABS: 10.0000 mg | ORAL_TABLET | Freq: Three times a day (TID) | ORAL | 0 refills | Status: DC | PRN

## 2020-02-27 MED FILL — Thrombin For Soln 5000 Unit: CUTANEOUS | Qty: 5000 | Status: AC

## 2020-02-27 NOTE — Discharge Summary (Signed)
Physician Discharge Summary  Patient ID: David [name] MRN: [id number] DOB/AGE: 68-Mar-1952 68 y.o.  Admit date: 02/26/2020 Discharge date: 02/27/2020  Admission Diagnoses: Cervical spondylosis, cervical radiculopathy, cervicalgia  Discharge Diagnoses: The same Active Problems:   Cervical spondylosis with radiculopathy   Discharged Condition: good  Hospital Course: I performed a C3-4, C4-5, C5-6 and C6-7 anterior cervicectomy, fusion and plating on the patient on 02/26/2020.  The surgery went well.  On postoperative day #1 the patient felt well and requested discharge to home.  He was given written and oral discharge instructions.  All his questions were answered.  Consults: OT, care management Significant Diagnostic Studies: None Treatments: C3-4, C4-5, C5-6 and C6-7 anterior cervical discectomy, fusion and plating. Discharge Exam: Blood pressure 135/76, pulse 74, temperature 97.9 F (36.6 C), temperature source Oral, resp. rate 20, height 5\' 8"  (1.727 m), weight 119.7 kg, SpO2 100 %. The patient is alert and pleasant.  He looks well.  His strength is normal.  His dressing has small bloodstain but no hematoma or shift.  Disposition: Home  Discharge Instructions    Call MD for:  difficulty breathing, headache or visual disturbances   Complete by: As directed    Call MD for:  extreme fatigue   Complete by: As directed    Call MD for:  hives   Complete by: As directed    Call MD for:  persistant dizziness or light-headedness   Complete by: As directed    Call MD for:  persistant nausea and vomiting   Complete by: As directed    Call MD for:  redness, tenderness, or signs of infection (pain, swelling, redness, odor or green/yellow discharge around incision site)   Complete by: As directed    Call MD for:  severe uncontrolled pain   Complete by: As directed    Call MD for:  temperature >100.4   Complete by: As directed    Diet - low sodium heart healthy   Complete by: As  directed    Discharge instructions   Complete by: As directed    Call [phone] for a followup appointment. Take a stool softener while you are using pain medications.   Driving Restrictions   Complete by: As directed    Do not drive for 2 weeks.   Increase activity slowly   Complete by: As directed    Lifting restrictions   Complete by: As directed    Do not lift more than 5 pounds. No excessive bending or twisting.   May shower / Bathe   Complete by: As directed    Remove the dressing for 3 days after surgery.  You may shower, but leave the incision alone.   Remove dressing in 48 hours   Complete by: As directed      Allergies as of 02/27/2020      Reactions   Codeine Itching   Dilaudid [hydromorphone Hcl] Itching   Fentanyl Itching   Pt states Fentanyl patch causes itching   Nucynta [tapentadol] Other (See Comments)   MENTAL PROBLEMS   Penicillins Itching   Has patient had a PCN reaction causing immediate rash, facial/tongue/throat swelling, SOB or lightheadedness with hypotension:unsure childhood reaction Has patient had a PCN reaction causing severe rash involving mucus membranes or skin necrosis:unsure Has patient had a PCN reaction that required hospitalization:No Has patient had a PCN reaction occurring within the last 10 years:NO If all of the above answers are "NO", then may proceed with Cephalosporin use.  Medication List    STOP taking these medications   acetaminophen 500 MG tablet Commonly known as: TYLENOL   EXCEDRIN PO     TAKE these medications   cyclobenzaprine 10 MG tablet Commonly known as: FLEXERIL Take 1 tablet (10 mg total) by mouth 3 (three) times daily as needed for muscle spasms.   docusate sodium 100 MG capsule Commonly known as: COLACE Take 1 capsule (100 mg total) by mouth 2 (two) times daily.   DULoxetine 60 MG capsule Commonly known as: CYMBALTA Take 60 mg by mouth 2 (two) times daily.   folic acid 1 MG tablet Commonly  known as: FOLVITE TAKE TWO TABLETS BY MOUTH DAILY   gabapentin 300 MG capsule Commonly known as: NEURONTIN Take 3 capsules (900 mg total) by mouth 3 (three) times daily.   hydrochlorothiazide 25 MG tablet Commonly known as: HYDRODIURIL Take 25 mg by mouth daily.   hydroxychloroquine 200 MG tablet Commonly known as: PLAQUENIL TAKE ONE TABLET BY MOUTH TWICE A DAY   methotrexate 50 MG/2ML injection INJECT 1 ML UNDER THE SKIN ONCE WEEKLY What changed:   how much to take  how to take this  when to take this  additional instructions   metoprolol succinate 25 MG 24 hr tablet Commonly known as: TOPROL-XL Take 25 mg by mouth daily.   oxyCODONE-acetaminophen 5-325 MG tablet Commonly known as: PERCOCET/ROXICET Take 1-2 tablets by mouth every 4 (four) hours as needed for moderate pain.   rOPINIRole 4 MG tablet Commonly known as: REQUIP Take 4 mg by mouth every evening.   simvastatin 40 MG tablet Commonly known as: ZOCOR Take 40 mg by mouth daily.   sulfaSALAzine 500 MG tablet Commonly known as: AZULFIDINE Take 2 tablets (1,000 mg total) by mouth 2 (two) times daily.   tamsulosin 0.4 MG Caps capsule Commonly known as: FLOMAX Take 0.4 mg by mouth daily.   Tuberculin-Allergy Syringes 27G X 1/2" 1 ML Misc Patient to inject MTX once weekly        Signed: Ophelia Charter 02/27/2020, 6:38 AM

## 2020-02-27 NOTE — Progress Notes (Signed)
Pt's JP drain was accidentally pulled out by patient while he was on his sleep. No bleeding nor trauma found on the incision site. A total of 20 ml serosanguinous output was emptied from drain since beginning of shift. A new honeycomb dressing was placed on the incision site. Will continue to monitor patient.

## 2020-02-27 NOTE — Discharge Instructions (Signed)

## 2020-02-27 NOTE — Care Management Obs Status (Signed)
[facility] NOTIFICATION   Patient Details  Name: David [name] MRN: [id number] Date of Birth: [birth year]   Medicare Observation Status Notification Given:  Yes    Benard Halsted, LCSW 02/27/2020, 9:46 AM

## 2020-02-27 NOTE — Care Management CC44 (Signed)
Condition Code 44 Documentation Completed  Patient Details  Name: David [name] MRN: [id number] Date of Birth: Mar 23, 1951   Condition Code 44 given:  Yes Patient signature on Condition Code 44 notice:  Yes Documentation of 2 MD's agreement:  Yes Code 44 added to claim:  Yes    Benard Halsted, LCSW 02/27/2020, 9:46 AM

## 2020-02-27 NOTE — Evaluation (Signed)
Occupational Therapy Evaluation Patient Details Name: David [name] MRN: [id number] DOB: [birth year] Today's Date: 02/27/2020    History of Present Illness Patient admitted and underwent C3-4, C4-5, C5-6 and C6-7 anterior cervical discectomy, fusion and plating.   Clinical Impression   Patient admitted with the above diagnosis.  He is up and walking ad lib with minimal soreness reported to his surgical site.  Patient has a good understanding of all precautions, will have assist as needed at home, and is ready for discharge home.  No further OT needs in the acute setting.  No home services needed.      Follow Up Recommendations  No OT follow up    Equipment Recommendations  None recommended by OT    Recommendations for Other Services       Precautions / Restrictions Precautions Precautions: Cervical Precaution Booklet Issued: Yes (comment) Required Braces or Orthoses: Cervical Brace Cervical Brace: Hard collar;For comfort Restrictions Weight Bearing Restrictions: No                         Transfers Overall transfer level: Independent               General transfer comment: Patient is up and walking ad lib with good safety and no LOB.    Balance Overall balance assessment: No apparent balance deficits (not formally assessed)                                         ADL either performed or assessed with clinical judgement   ADL Overall ADL's : Modified independent                                       General ADL Comments: Patient has a good understanding of modifcations needed to perform self care.     Vision Patient Visual Report: Blurring of vision Vision Assessment?: No apparent visual deficits     Perception     Praxis      Pertinent Vitals/Pain Pain Assessment: Faces Faces Pain Scale: Hurts a little bit Pain Location: surgical site.  Patient was medicated prior to session. Pain Descriptors / Indicators:  Sore     Hand Dominance Right   Extremity/Trunk Assessment Upper Extremity Assessment Upper Extremity Assessment: RUE deficits/detail RUE Deficits / Details: chronic arthritis RUE Sensation: WNL RUE Coordination: WNL   Lower Extremity Assessment Lower Extremity Assessment: Overall WFL for tasks assessed   Cervical / Trunk Assessment Cervical / Trunk Assessment: Normal   Communication Communication Communication: No difficulties   Cognition Arousal/Alertness: Awake/alert Behavior During Therapy: WFL for tasks assessed/performed Overall Cognitive Status: Within Functional Limits for tasks assessed                                                      Home Living Family/patient expects to be discharged to:: Private residence Living Arrangements: Spouse/significant other   Type of Home: House                 Bathroom Toilet: Standard     Home Equipment: None          Prior  Functioning/Environment Level of Independence: Independent                 OT Problem List: Pain      OT Treatment/Interventions:      OT Goals(Current goals can be found in the care plan section) Acute Rehab OT Goals Patient Stated Goal: Return home. OT Goal Formulation: With patient Time For Goal Achievement: 03/01/20 Potential to Achieve Goals: Good  OT Frequency:     Barriers to D/C:  None          Co-evaluation              AM-PAC OT "6 Clicks" Daily Activity     Outcome Measure Help from another person eating meals?: None Help from another person taking care of personal grooming?: None Help from another person toileting, which includes using toliet, bedpan, or urinal?: None Help from another person bathing (including washing, rinsing, drying)?: A Little Help from another person to put on and taking off regular upper body clothing?: None Help from another person to put on and taking off regular lower body clothing?: A Little 6 Click  Score: 22   End of Session Equipment Utilized During Treatment: Cervical collar  Activity Tolerance: Patient tolerated treatment well Patient left: with family/visitor present;Other (comment) (Getting dressed and ready for discharge.)  OT Visit Diagnosis: Pain Pain - part of body:  (neck)                Time: 7972-8206 OT Time Calculation (min): 24 min Charges:  OT General Charges $OT Visit: 1 Visit OT Evaluation $OT Eval Low Complexity: 1 Low  02/27/2020  David [name], OTR/L  Acute Rehabilitation Services  Office:  [phone]   David [name] 02/27/2020, 8:43 AM

## 2020-03-04 ENCOUNTER — Other Ambulatory Visit: Payer: Self-pay | Admitting: *Deleted

## 2020-03-04 DIAGNOSIS — Z79899 Other long term (current) drug therapy: Secondary | ICD-10-CM

## 2020-03-04 DIAGNOSIS — M0609 Rheumatoid arthritis without rheumatoid factor, multiple sites: Secondary | ICD-10-CM

## 2020-03-04 MED ORDER — HYDROXYCHLOROQUINE SULFATE 200 MG PO TABS: 200.0000 mg | ORAL_TABLET | Freq: Two times a day (BID) | ORAL | 0 refills | Status: DC

## 2020-03-04 NOTE — Telephone Encounter (Signed)
Refill request received via fax  Last Visit: 01/06/2020 Next Visit: 06/08/2020 Labs: 01/06/2020 CBC and CMP WNL Eye exam:  11/15/18 WNL  PLQ eye exam scheduled for 02/11/2020.  Current Dose per office note 01/06/2020: Plaquenil 200 mg 1 tablet twice daily DX: Rheumatoid arthritis of multiple sites with negative rheumatoid factor   Left message to advise patient he is due to update PLQ eye exam.  Okay to refill PLQ?

## 2020-03-17 DIAGNOSIS — I10 Essential (primary) hypertension: Secondary | ICD-10-CM | POA: Diagnosis not present

## 2020-03-17 DIAGNOSIS — M0609 Rheumatoid arthritis without rheumatoid factor, multiple sites: Secondary | ICD-10-CM | POA: Diagnosis not present

## 2020-03-17 DIAGNOSIS — G8929 Other chronic pain: Secondary | ICD-10-CM | POA: Diagnosis not present

## 2020-03-17 DIAGNOSIS — N400 Enlarged prostate without lower urinary tract symptoms: Secondary | ICD-10-CM | POA: Diagnosis not present

## 2020-03-17 DIAGNOSIS — G4700 Insomnia, unspecified: Secondary | ICD-10-CM | POA: Diagnosis not present

## 2020-03-17 DIAGNOSIS — E782 Mixed hyperlipidemia: Secondary | ICD-10-CM | POA: Diagnosis not present

## 2020-03-17 DIAGNOSIS — F322 Major depressive disorder, single episode, severe without psychotic features: Secondary | ICD-10-CM | POA: Diagnosis not present

## 2020-03-17 DIAGNOSIS — F325 Major depressive disorder, single episode, in full remission: Secondary | ICD-10-CM | POA: Diagnosis not present

## 2020-03-17 DIAGNOSIS — M1990 Unspecified osteoarthritis, unspecified site: Secondary | ICD-10-CM | POA: Diagnosis not present

## 2020-03-23 DIAGNOSIS — M4712 Other spondylosis with myelopathy, cervical region: Secondary | ICD-10-CM | POA: Diagnosis not present

## 2020-03-26 ENCOUNTER — Other Ambulatory Visit: Payer: Self-pay | Admitting: Rheumatology

## 2020-03-26 DIAGNOSIS — M0609 Rheumatoid arthritis without rheumatoid factor, multiple sites: Secondary | ICD-10-CM

## 2020-03-26 DIAGNOSIS — Z79899 Other long term (current) drug therapy: Secondary | ICD-10-CM

## 2020-03-26 NOTE — Telephone Encounter (Signed)
Last Visit:01/06/2020 Next Visit:06/08/2020 Labs:8/17/2021CBC and CMP WNL Eye exam:11/15/18 WNL   Current Dose per office note8/17/2021:Plaquenil 200 mg 1 tablet twice daily [id number] arthritis of multiple sites with negative rheumatoid factor  Patient's wife will pick up copy of results to PLQ eye exam and mail to the office.   Okay to refill PLQ?

## 2020-04-03 ENCOUNTER — Other Ambulatory Visit: Payer: Self-pay | Admitting: Orthopaedic Surgery

## 2020-04-08 DIAGNOSIS — M76821 Posterior tibial tendinitis, right leg: Secondary | ICD-10-CM | POA: Diagnosis not present

## 2020-04-25 ENCOUNTER — Other Ambulatory Visit: Payer: Self-pay | Admitting: Rheumatology

## 2020-04-25 DIAGNOSIS — M0609 Rheumatoid arthritis without rheumatoid factor, multiple sites: Secondary | ICD-10-CM

## 2020-04-25 DIAGNOSIS — Z79899 Other long term (current) drug therapy: Secondary | ICD-10-CM

## 2020-04-26 DIAGNOSIS — E782 Mixed hyperlipidemia: Secondary | ICD-10-CM | POA: Diagnosis not present

## 2020-04-26 DIAGNOSIS — F325 Major depressive disorder, single episode, in full remission: Secondary | ICD-10-CM | POA: Diagnosis not present

## 2020-04-26 DIAGNOSIS — I700 Atherosclerosis of aorta: Secondary | ICD-10-CM | POA: Diagnosis not present

## 2020-04-26 DIAGNOSIS — I10 Essential (primary) hypertension: Secondary | ICD-10-CM | POA: Diagnosis not present

## 2020-04-26 DIAGNOSIS — M1990 Unspecified osteoarthritis, unspecified site: Secondary | ICD-10-CM | POA: Diagnosis not present

## 2020-04-26 DIAGNOSIS — M792 Neuralgia and neuritis, unspecified: Secondary | ICD-10-CM | POA: Diagnosis not present

## 2020-04-26 DIAGNOSIS — Z23 Encounter for immunization: Secondary | ICD-10-CM | POA: Diagnosis not present

## 2020-04-26 DIAGNOSIS — Z0000 Encounter for general adult medical examination without abnormal findings: Secondary | ICD-10-CM | POA: Diagnosis not present

## 2020-04-26 DIAGNOSIS — G2581 Restless legs syndrome: Secondary | ICD-10-CM | POA: Diagnosis not present

## 2020-05-10 DIAGNOSIS — Z23 Encounter for immunization: Secondary | ICD-10-CM | POA: Diagnosis not present

## 2020-05-21 DIAGNOSIS — I10 Essential (primary) hypertension: Secondary | ICD-10-CM | POA: Diagnosis not present

## 2020-05-21 DIAGNOSIS — G4700 Insomnia, unspecified: Secondary | ICD-10-CM | POA: Diagnosis not present

## 2020-05-21 DIAGNOSIS — M0609 Rheumatoid arthritis without rheumatoid factor, multiple sites: Secondary | ICD-10-CM | POA: Diagnosis not present

## 2020-05-21 DIAGNOSIS — E782 Mixed hyperlipidemia: Secondary | ICD-10-CM | POA: Diagnosis not present

## 2020-05-21 DIAGNOSIS — F322 Major depressive disorder, single episode, severe without psychotic features: Secondary | ICD-10-CM | POA: Diagnosis not present

## 2020-05-21 DIAGNOSIS — F325 Major depressive disorder, single episode, in full remission: Secondary | ICD-10-CM | POA: Diagnosis not present

## 2020-05-21 DIAGNOSIS — M1990 Unspecified osteoarthritis, unspecified site: Secondary | ICD-10-CM | POA: Diagnosis not present

## 2020-05-21 DIAGNOSIS — N400 Enlarged prostate without lower urinary tract symptoms: Secondary | ICD-10-CM | POA: Diagnosis not present

## 2020-05-21 DIAGNOSIS — G8929 Other chronic pain: Secondary | ICD-10-CM | POA: Diagnosis not present

## 2020-05-22 ENCOUNTER — Other Ambulatory Visit: Payer: Self-pay | Admitting: Rheumatology

## 2020-05-24 NOTE — Telephone Encounter (Signed)
Last Visit:01/06/2020 Next Visit:06/08/2020 Labs:02/24/2020 Glucose 106  Current Dose per office note8/17/2021:Sulfasalazine 500 mg 2 tablets twice daily [id number] arthritis of multiple sites with negative rheumatoid factor  Patient to update labs at upcoming appointment on 06/08/2020  Okay to refill SSZ?

## 2020-05-25 NOTE — Progress Notes (Deleted)
Office Visit Note  Patient: David [name]             Date of Birth: Sep 11, 1950           MRN: [id number]             PCP: Orpah Melter, MD Referring: Orpah Melter, MD Visit Date: 06/08/2020 Occupation: @GUAROCC @  Subjective:  No chief complaint on file.   History of Present Illness: David [name] is a 70 y.o. male ***   Activities of Daily Living:  Patient reports morning stiffness for *** {minute/hour:19697}.   Patient {ACTIONS;DENIES/REPORTS:21021675::"Denies"} nocturnal pain.  Difficulty dressing/grooming: {ACTIONS;DENIES/REPORTS:21021675::"Denies"} Difficulty climbing stairs: {ACTIONS;DENIES/REPORTS:21021675::"Denies"} Difficulty getting out of chair: {ACTIONS;DENIES/REPORTS:21021675::"Denies"} Difficulty using hands for taps, buttons, cutlery, and/or writing: {ACTIONS;DENIES/REPORTS:21021675::"Denies"}  No Rheumatology ROS completed.   PMFS History:  Patient Active Problem List   Diagnosis Date Noted  . Cervical spondylosis with radiculopathy 02/26/2020  . SBO (small bowel obstruction) ([place]) 08/21/2017  . Trochanteric bursitis of left hip 01/18/2017  . Lateral epicondylitis, right elbow 01/18/2017  . Rheumatoid arthritis ([place]) 03/15/2016  . DJD (degenerative joint disease), cervical 03/15/2016  . DDD (degenerative disc disease), lumbar 03/15/2016  . HTN (hypertension) 03/15/2016  . Obesity 03/15/2016  . Elevated cholesterol 03/15/2016  . Neuralgia 03/15/2016  . Malignant melanoma of right side of neck ([place]) 03/15/2016  . BBB (bundle branch block) 03/15/2016  . High risk medication use 03/15/2016  . Osteoarthritis of lumbar spine 03/15/2016  . Primary osteoarthritis of both hands 03/15/2016  . Primary osteoarthritis of both knees 03/15/2016  . Mesenteric adenitis 06/29/2015    Past Medical History:  Diagnosis Date  . Arthritis   . BBB (bundle branch block) 03/15/2016  . Carpal tunnel syndrome   . DDD (degenerative disc disease), lumbar 03/15/2016   . Depression   . Dysrhythmia    had some tachycardia with steroids  . Elevated cholesterol 03/15/2016  . Headache(784.0)    Ocular Migraines - takes Ambien for it  . Heart murmur   . HTN (hypertension) 03/15/2016  . Hyperlipemia   . Hypertension   . Malignant melanoma of right side of neck ([place]) 03/15/2016   30 years ago   . Neuralgia 03/15/2016  . 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 Rheumatoid arthritis ([place]) 03/15/2016   Sero Negative.   Marland Kitchen RLS (restless legs syndrome)   . Sleep apnea    could not use a cpap  . Snores   . Status post gastric banding   . Wears glasses    driving    Family History  Problem Relation Age of Onset  . Heart disease Mother   . Alzheimer's disease Father    Past Surgical History:  Procedure Laterality Date  . ANTERIOR CERVICAL DECOMPRESSION/DISCECTOMY FUSION 4 LEVELS N/A 02/26/2020   Procedure: ANTERIOR CERVICAL DECOMPRESSION/DISCECTOMY FUSION, INTERBODY PROSTHESIS, PLATE/SCREWS CERVICAL THREE-FOUR CERVICAL FOUR-FIVE CERVICAL FIVE-SIX- CERVICAL SIX-SEVEN;  Surgeon: Newman Pies, MD;  Location: [place];  Service: Neurosurgery;  Laterality: N/A;  . CARPAL TUNNEL RELEASE Right 09/26/2012   Procedure: CARPAL TUNNEL RELEASE;  Surgeon: Tennis Must, MD;  Location: [place];  Service: Orthopedics;  Laterality: Right;  . CARPAL TUNNEL RELEASE Left 10/24/2012   Procedure: CARPAL TUNNEL RELEASE;  Surgeon: Tennis Must, MD;  Location: [place];  Service: Orthopedics;  Laterality: Left;  . EXCISION MELANOMA WITH SENTINEL LYMPH NODE BIOPSY  1984   rt neck with mulpipal nodes and some muscle excised rt neck-shoulder  .  KNEE ARTHROPLASTY    . knee replacement    . LAPAROSCOPIC GASTRIC BANDING  03/15/2009  . LAPAROSCOPIC GASTRIC BANDING    . MUSCLE BIOPSY  2004   rt thigh to r/o myocitis  . nerve conduction velosity test    . TONSILLECTOMY    . TOTAL KNEE ARTHROPLASTY Bilateral 12/05/2010  . TRIGGER FINGER  RELEASE Right 09/26/2012   Procedure: RELEASE TRIGGER FINGER/A-1 PULLEY RING AND SMALL ;  Surgeon: Tami Ribas, MD;  Location: [facility];  Service: Orthopedics;  Laterality: Right;  . TRIGGER FINGER RELEASE Left 10/24/2012   Procedure: RELEASE TRIGGER FINGER/A-1 PULLEY LEFT MIDDLE AND LEFT RING;  Surgeon: Tami Ribas, MD;  Location: [facility];  Service: Orthopedics;  Laterality: Left;   Social History   Social History Narrative  . Not on file   Immunization History  Administered Date(s) Administered  . Moderna Sars-Covid-2 Vaccination 06/10/2019, 07/08/2019     Objective: Vital Signs: There were no vitals taken for this visit.   Physical Exam   Musculoskeletal Exam: ***  CDAI Exam: CDAI Score: -- Patient Global: --; Provider Global: -- Swollen: --; Tender: -- Joint Exam 06/08/2020   No joint exam has been documented for this visit   There is currently no information documented on the homunculus. Go to the Rheumatology activity and complete the homunculus joint exam.  Investigation: No additional findings.  Imaging: No results found.  Recent Labs: Lab Results  Component Value Date   WBC 8.2 02/24/2020   HGB 15.1 02/24/2020   PLT 278 02/24/2020   NA 138 02/24/2020   K 4.6 02/24/2020   CL 100 02/24/2020   CO2 28 02/24/2020   GLUCOSE 103 (H) 02/24/2020   BUN 21 02/24/2020   CREATININE 1.08 02/24/2020   BILITOT 0.4 01/06/2020   ALKPHOS 72 09/14/2018   AST 27 01/06/2020   ALT 24 01/06/2020   PROT 6.3 01/06/2020   ALBUMIN 3.8 09/14/2018   CALCIUM 10.0 02/24/2020   GFRAA 78 01/06/2020    Speciality Comments: PLQ Eye Exam: 11/15/18 WNL @ [facility]   Procedures:  No procedures performed Allergies: Codeine, Dilaudid [hydromorphone hcl], Fentanyl, Nucynta [tapentadol], and Penicillins   Assessment / Plan:     Visit Diagnoses: No diagnosis found.  Orders: No orders of the defined types were placed in this encounter.  No  orders of the defined types were placed in this encounter.   Face-to-face time spent with patient was *** minutes. Greater than 50% of time was spent in counseling and coordination of care.  Follow-Up Instructions: No follow-ups on file.   Ellen Henri, CMA  Note - This record has been created using Animal nutritionist.  Chart creation errors have been sought, but may not always  have been located. Such creation errors do not reflect on  the standard of medical care.

## 2020-05-27 DIAGNOSIS — M79672 Pain in left foot: Secondary | ICD-10-CM | POA: Diagnosis not present

## 2020-05-27 DIAGNOSIS — M76821 Posterior tibial tendinitis, right leg: Secondary | ICD-10-CM | POA: Diagnosis not present

## 2020-05-27 DIAGNOSIS — M25371 Other instability, right ankle: Secondary | ICD-10-CM | POA: Diagnosis not present

## 2020-06-04 NOTE — Progress Notes (Deleted)
Office Visit Note  Patient: David [name]             Date of Birth: [birth year]           MRN: [id number]             PCP: Orpah Melter, MD Referring: Orpah Melter, MD Visit Date: 06/15/2020 Occupation: @GUAROCC @  Subjective:  No chief complaint on file.   History of Present Illness: David [name] is a 70 y.o. male ***   Activities of Daily Living:  Patient reports morning stiffness for *** {minute/hour:19697}.   Patient {ACTIONS;DENIES/REPORTS:21021675::"Denies"} nocturnal pain.  Difficulty dressing/grooming: {ACTIONS;DENIES/REPORTS:21021675::"Denies"} Difficulty climbing stairs: {ACTIONS;DENIES/REPORTS:21021675::"Denies"} Difficulty getting out of chair: {ACTIONS;DENIES/REPORTS:21021675::"Denies"} Difficulty using hands for taps, buttons, cutlery, and/or writing: {ACTIONS;DENIES/REPORTS:21021675::"Denies"}  No Rheumatology ROS completed.   PMFS History:  Patient Active Problem List   Diagnosis Date Noted  . Cervical spondylosis with radiculopathy 02/26/2020  . SBO (small bowel obstruction) ([place]) 08/21/2017  . Trochanteric bursitis of left hip 01/18/2017  . Lateral epicondylitis, right elbow 01/18/2017  . Rheumatoid arthritis ([place]) 03/15/2016  . DJD (degenerative joint disease), cervical 03/15/2016  . DDD (degenerative disc disease), lumbar 03/15/2016  . HTN (hypertension) 03/15/2016  . Obesity 03/15/2016  . Elevated cholesterol 03/15/2016  . Neuralgia 03/15/2016  . Malignant melanoma of right side of neck ([place]) 03/15/2016  . BBB (bundle branch block) 03/15/2016  . High risk medication use 03/15/2016  . Osteoarthritis of lumbar spine 03/15/2016  . Primary osteoarthritis of both hands 03/15/2016  . Primary osteoarthritis of both knees 03/15/2016  . Mesenteric adenitis 06/29/2015    Past Medical History:  Diagnosis Date  . Arthritis   . BBB (bundle branch block) 03/15/2016  . Carpal tunnel syndrome   . DDD (degenerative disc disease), lumbar 03/15/2016   . Depression   . Dysrhythmia    had some tachycardia with steroids  . Elevated cholesterol 03/15/2016  . Headache(784.0)    Ocular Migraines - takes Ambien for it  . Heart murmur   . HTN (hypertension) 03/15/2016  . Hyperlipemia   . Hypertension   . Malignant melanoma of right side of neck ([place]) 03/15/2016   30 years ago   . Neuralgia 03/15/2016  . 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 Rheumatoid arthritis ([place]) 03/15/2016   Sero Negative.   Marland Kitchen RLS (restless legs syndrome)   . Sleep apnea    could not use a cpap  . Snores   . Status post gastric banding   . Wears glasses    driving    Family History  Problem Relation Age of Onset  . Heart disease Mother   . Alzheimer's disease Father    Past Surgical History:  Procedure Laterality Date  . ANTERIOR CERVICAL DECOMPRESSION/DISCECTOMY FUSION 4 LEVELS N/A 02/26/2020   Procedure: ANTERIOR CERVICAL DECOMPRESSION/DISCECTOMY FUSION, INTERBODY PROSTHESIS, PLATE/SCREWS CERVICAL THREE-FOUR CERVICAL FOUR-FIVE CERVICAL FIVE-SIX- CERVICAL SIX-SEVEN;  Surgeon: Newman Pies, MD;  Location: [place];  Service: Neurosurgery;  Laterality: N/A;  . CARPAL TUNNEL RELEASE Right 09/26/2012   Procedure: CARPAL TUNNEL RELEASE;  Surgeon: Tennis Must, MD;  Location: [place];  Service: Orthopedics;  Laterality: Right;  . CARPAL TUNNEL RELEASE Left 10/24/2012   Procedure: CARPAL TUNNEL RELEASE;  Surgeon: Tennis Must, MD;  Location: [place];  Service: Orthopedics;  Laterality: Left;  . EXCISION MELANOMA WITH SENTINEL LYMPH NODE BIOPSY  1984   rt neck with mulpipal nodes and some muscle excised rt neck-shoulder  .  KNEE ARTHROPLASTY    . knee replacement    . LAPAROSCOPIC GASTRIC BANDING  03/15/2009  . LAPAROSCOPIC GASTRIC BANDING    . MUSCLE BIOPSY  2004   rt thigh to r/o myocitis  . nerve conduction velosity test    . TONSILLECTOMY    . TOTAL KNEE ARTHROPLASTY Bilateral 12/05/2010  . TRIGGER FINGER  RELEASE Right 09/26/2012   Procedure: RELEASE TRIGGER FINGER/A-1 PULLEY RING AND SMALL ;  Surgeon: Tennis Must, MD;  Location: [place];  Service: Orthopedics;  Laterality: Right;  . TRIGGER FINGER RELEASE Left 10/24/2012   Procedure: RELEASE TRIGGER FINGER/A-1 PULLEY LEFT MIDDLE AND LEFT RING;  Surgeon: Tennis Must, MD;  Location: [place];  Service: Orthopedics;  Laterality: Left;   Social History   Social History Narrative  . Not on file   Immunization History  Administered Date(s) Administered  . Moderna Sars-Covid-2 Vaccination 06/10/2019, 07/08/2019     Objective: Vital Signs: There were no vitals taken for this visit.   Physical Exam   Musculoskeletal Exam: ***  CDAI Exam: CDAI Score: -- Patient Global: --; Provider Global: -- Swollen: --; Tender: -- Joint Exam 06/15/2020   No joint exam has been documented for this visit   There is currently no information documented on the homunculus. Go to the Rheumatology activity and complete the homunculus joint exam.  Investigation: No additional findings.  Imaging: No results found.  Recent Labs: Lab Results  Component Value Date   WBC 8.2 02/24/2020   HGB 15.1 02/24/2020   PLT 278 02/24/2020   NA 138 02/24/2020   K 4.6 02/24/2020   CL 100 02/24/2020   CO2 28 02/24/2020   GLUCOSE 103 (H) 02/24/2020   BUN 21 02/24/2020   CREATININE 1.08 02/24/2020   BILITOT 0.4 01/06/2020   ALKPHOS 72 09/14/2018   AST 27 01/06/2020   ALT 24 01/06/2020   PROT 6.3 01/06/2020   ALBUMIN 3.8 09/14/2018   CALCIUM 10.0 02/24/2020   GFRAA 78 01/06/2020    Speciality Comments: PLQ Eye Exam: 11/15/18 WNL @ [facility]   Procedures:  No procedures performed Allergies: Codeine, Dilaudid [hydromorphone hcl], Fentanyl, Nucynta [tapentadol], and Penicillins   Assessment / Plan:     Visit Diagnoses: No diagnosis found.  Orders: No orders of the defined types were placed in this encounter.  No  orders of the defined types were placed in this encounter.   Face-to-face time spent with patient was *** minutes. Greater than 50% of time was spent in counseling and coordination of care.  Follow-Up Instructions: No follow-ups on file.   Earnestine Mealing, CMA  Note - This record has been created using Editor, commissioning.  Chart creation errors have been sought, but may not always  have been located. Such creation errors do not reflect on  the standard of medical care.

## 2020-06-08 ENCOUNTER — Ambulatory Visit: Payer: Medicare Other | Admitting: Physician Assistant

## 2020-06-08 DIAGNOSIS — M792 Neuralgia and neuritis, unspecified: Secondary | ICD-10-CM

## 2020-06-08 DIAGNOSIS — M5136 Other intervertebral disc degeneration, lumbar region: Secondary | ICD-10-CM

## 2020-06-08 DIAGNOSIS — M0609 Rheumatoid arthritis without rheumatoid factor, multiple sites: Secondary | ICD-10-CM

## 2020-06-08 DIAGNOSIS — M19011 Primary osteoarthritis, right shoulder: Secondary | ICD-10-CM

## 2020-06-08 DIAGNOSIS — Z79899 Other long term (current) drug therapy: Secondary | ICD-10-CM

## 2020-06-08 DIAGNOSIS — Z85820 Personal history of malignant melanoma of skin: Secondary | ICD-10-CM

## 2020-06-08 DIAGNOSIS — Z96653 Presence of artificial knee joint, bilateral: Secondary | ICD-10-CM

## 2020-06-08 DIAGNOSIS — M5030 Other cervical disc degeneration, unspecified cervical region: Secondary | ICD-10-CM

## 2020-06-08 DIAGNOSIS — M19041 Primary osteoarthritis, right hand: Secondary | ICD-10-CM

## 2020-06-10 NOTE — Progress Notes (Signed)
Office Visit Note  Patient: David [name]             Date of Birth: [birth year]           MRN: [id number]             PCP: Orpah Melter, MD Referring: Orpah Melter, MD Visit Date: 06/11/2020 Occupation: @GUAROCC @  Subjective:  Medication monitoring   History of Present Illness: David [name] is a 70 y.o. male with history of seronegative rheumatoid arthritis, osteoarthritis, and DDD.  Patient is on methotrexate 1.0 mL sq injections once weekly, sulfasalazine 500 mg 2 tablets by mouth twice daily, and Plaquenil 200 mg 1 tablet by mouth twice daily.  He is tolerating these medications without any side effects.  He denies any recent rheumatoid arthritis flares.  He states that he continues to experience stiffness in both hands which last several hours every morning but denies any joint swelling.  He is not having any other joint pain or joint swelling at this time.  According to the patient he had a C-spine fusion performed by Dr. Arnoldo Morale in October 2021 which resolved his neck pain and symptoms of radiculopathy.  He states that the surgery has been life-changing. He states that he feels that he has gotten his life back. He denies any nocturnal pain or difficulty with ADLs.   He denies any recent infections.     Activities of Daily Living:  Patient reports morning stiffness for 2-3 hours.   Patient Denies nocturnal pain.  Difficulty dressing/grooming: Denies Difficulty climbing stairs: Denies Difficulty getting out of chair: Denies Difficulty using hands for taps, buttons, cutlery, and/or writing: Reports  Review of Systems  Constitutional: Negative for fatigue.  HENT: Positive for mouth dryness. Negative for mouth sores and nose dryness.   Eyes: Negative for pain, itching and dryness.  Respiratory: Negative for shortness of breath and difficulty breathing.   Cardiovascular: Negative for chest pain and palpitations.  Gastrointestinal: Negative for blood in stool, constipation  and diarrhea.  Endocrine: Negative for increased urination.  Genitourinary: Negative for difficulty urinating.  Musculoskeletal: Positive for joint swelling and morning stiffness. Negative for arthralgias, joint pain, myalgias, muscle tenderness and myalgias.  Skin: Negative for color change, rash and redness.  Allergic/Immunologic: Negative for susceptible to infections.  Neurological: Positive for numbness. Negative for dizziness, headaches, memory loss and weakness.  Hematological: Negative for bruising/bleeding tendency.  Psychiatric/Behavioral: Negative for confusion.    PMFS History:  Patient Active Problem List   Diagnosis Date Noted  . Cervical spondylosis with radiculopathy 02/26/2020  . SBO (small bowel obstruction) ([place]) 08/21/2017  . Trochanteric bursitis of left hip 01/18/2017  . Lateral epicondylitis, right elbow 01/18/2017  . Rheumatoid arthritis ([place]) 03/15/2016  . DJD (degenerative joint disease), cervical 03/15/2016  . DDD (degenerative disc disease), lumbar 03/15/2016  . HTN (hypertension) 03/15/2016  . Obesity 03/15/2016  . Elevated cholesterol 03/15/2016  . Neuralgia 03/15/2016  . Malignant melanoma of right side of neck ([place]) 03/15/2016  . BBB (bundle branch block) 03/15/2016  . High risk medication use 03/15/2016  . Osteoarthritis of lumbar spine 03/15/2016  . Primary osteoarthritis of both hands 03/15/2016  . Primary osteoarthritis of both knees 03/15/2016  . Mesenteric adenitis 06/29/2015    Past Medical History:  Diagnosis Date  . Arthritis   . BBB (bundle branch block) 03/15/2016  . Carpal tunnel syndrome   . DDD (degenerative disc disease), lumbar 03/15/2016  . Depression   . Dysrhythmia    had  some tachycardia with steroids  . Elevated cholesterol 03/15/2016  . Headache(784.0)    Ocular Migraines - takes Ambien for it  . Heart murmur   . HTN (hypertension) 03/15/2016  . Hyperlipemia   . Hypertension   . Malignant melanoma of right side of  neck ([place]) 03/15/2016   30 years ago   . Neuralgia 03/15/2016  . 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 Rheumatoid arthritis ([employer]) 03/15/2016   Sero Negative.   Marland Kitchen RLS (restless legs syndrome)   . Sleep apnea    could not use a cpap  . Snores   . Status post gastric banding   . Wears glasses    driving    Family History  Problem Relation Age of Onset  . Heart disease Mother   . Alzheimer's disease Father    Past Surgical History:  Procedure Laterality Date  . ANTERIOR CERVICAL DECOMPRESSION/DISCECTOMY FUSION 4 LEVELS N/A 02/26/2020   Procedure: ANTERIOR CERVICAL DECOMPRESSION/DISCECTOMY FUSION, INTERBODY PROSTHESIS, PLATE/SCREWS CERVICAL THREE-FOUR CERVICAL FOUR-FIVE CERVICAL FIVE-SIX- CERVICAL SIX-SEVEN;  Surgeon: Newman Pies, MD;  Location: [facility];  Service: Neurosurgery;  Laterality: N/A;  . CARPAL TUNNEL RELEASE Right 09/26/2012   Procedure: CARPAL TUNNEL RELEASE;  Surgeon: Tennis Must, MD;  Location: [facility];  Service: Orthopedics;  Laterality: Right;  . CARPAL TUNNEL RELEASE Left 10/24/2012   Procedure: CARPAL TUNNEL RELEASE;  Surgeon: Tennis Must, MD;  Location: [facility];  Service: Orthopedics;  Laterality: Left;  . EXCISION MELANOMA WITH SENTINEL LYMPH NODE BIOPSY  1984   rt neck with mulpipal nodes and some muscle excised rt neck-shoulder  . KNEE ARTHROPLASTY    . knee replacement    . LAPAROSCOPIC GASTRIC BANDING  03/15/2009  . LAPAROSCOPIC GASTRIC BANDING    . MUSCLE BIOPSY  2004   rt thigh to r/o myocitis  . nerve conduction velosity test    . TONSILLECTOMY    . TOTAL KNEE ARTHROPLASTY Bilateral 12/05/2010  . TRIGGER FINGER RELEASE Right 09/26/2012   Procedure: RELEASE TRIGGER FINGER/A-1 PULLEY RING AND SMALL ;  Surgeon: Tennis Must, MD;  Location: [facility];  Service: Orthopedics;  Laterality: Right;  . TRIGGER FINGER RELEASE Left 10/24/2012   Procedure: RELEASE TRIGGER FINGER/A-1 PULLEY LEFT  MIDDLE AND LEFT RING;  Surgeon: Tennis Must, MD;  Location: [facility];  Service: Orthopedics;  Laterality: Left;   Social History   Social History Narrative  . Not on file   Immunization History  Administered Date(s) Administered  . Moderna Sars-Covid-2 Vaccination 06/10/2019, 07/08/2019  . PFIZER(Purple Top)SARS-COV-2 Vaccination 05/21/2020     Objective: Vital Signs: BP 131/82 (BP Location: Left Arm, Patient Position: Sitting, Cuff Size: Large)   Pulse 69   Resp 17   Ht 5\' 8"  (1.727 m)   Wt 253 lb (114.8 kg)   BMI 38.47 kg/m    Physical Exam Vitals and nursing note reviewed.  Constitutional:      Appearance: He is well-developed and well-nourished.  HENT:     Head: Normocephalic and atraumatic.  Eyes:     Extraocular Movements: EOM normal.     Conjunctiva/sclera: Conjunctivae normal.     Pupils: Pupils are equal, round, and reactive to light.  Pulmonary:     Effort: Pulmonary effort is normal.  Abdominal:     Palpations: Abdomen is soft.  Musculoskeletal:     Cervical back: Normal range of motion and neck supple.  Skin:  General: Skin is warm and dry.     Capillary Refill: Capillary refill takes less than 2 seconds.  Neurological:     Mental Status: He is alert and oriented to person, place, and time.  Psychiatric:        Mood and Affect: Mood and affect normal.        Behavior: Behavior normal.      Musculoskeletal Exam: C-spine limited ROM with extension and lateral rotation bilaterally.  Thoracic and lumbar spine good ROM. Right shoulder limited abduction to 120 degrees.  Left shoulder has full ROM with no discomfort.  Elbow joints, wrist joints, MCPs, PIPs, and DIPs good ROM with no synovitis.  PIP and DIP thickening consistent with osteoarthritis of both hands.  Hip joints good ROM with no discomfort.  Bilateral knee replacements have good ROM with no discomfort.  Ankle joints good ROM with no tenderness or inflammation.   CDAI Exam: CDAI  Score: 0.6  Patient Global: 3 mm; Provider Global: 3 mm Swollen: 0 ; Tender: 0  Joint Exam 06/11/2020   No joint exam has been documented for this visit   There is currently no information documented on the homunculus. Go to the Rheumatology activity and complete the homunculus joint exam.  Investigation: No additional findings.  Imaging: No results found.  Recent Labs: Lab Results  Component Value Date   WBC 8.2 02/24/2020   HGB 15.1 02/24/2020   PLT 278 02/24/2020   NA 138 02/24/2020   K 4.6 02/24/2020   CL 100 02/24/2020   CO2 28 02/24/2020   GLUCOSE 103 (H) 02/24/2020   BUN 21 02/24/2020   CREATININE 1.08 02/24/2020   BILITOT 0.4 01/06/2020   ALKPHOS 72 09/14/2018   AST 27 01/06/2020   ALT 24 01/06/2020   PROT 6.3 01/06/2020   ALBUMIN 3.8 09/14/2018   CALCIUM 10.0 02/24/2020   GFRAA 78 01/06/2020    Speciality Comments: PLQ Eye Exam: 11/15/18 WNL @ [facility]   Procedures:  No procedures performed Allergies: Codeine, Dilaudid [hydromorphone hcl], Fentanyl, Nucynta [tapentadol], and Penicillins   Assessment / Plan:     Visit Diagnoses: Rheumatoid arthritis of multiple sites with negative rheumatoid factor ([place]): He has no joint tenderness or synovitis on exam.  He has not had any recent rheumatoid arthritis flares.  He is clinically doing well on Methotrexate 1.0 ml sq injections once weekly, folic acid 2 mg daily, sulfasalazine 500 mg 2 tablets by mouth twice daily, and plaquenil 200 mg 1 tablet by mouth twice daily.  He is tolerating these medications without any side effects.  He continues to experience stiffness in both hands for 2-3 hours every morning but has not had any increased joint pain or joint swelling. He has PIP and DIP thickening consistent with osteoarthritic changes.  He underwent C-spine fusion by Dr. Arnoldo Morale in October 2021, which has resolved his neck pain and symptoms of radiculopathy.  He has been able to start tapering the dose of gabapentin and  has not needed to take any OTC products for pain relief.  He feels this surgery has been life changing.  He is not having any other joint pain or joint swelling at this time.  He will continue on the current treatment regimen. He does not need refills at this time.  He was advised to notify us if he develops increased joint pain or joint swelling.  He will follow up in 5 months.    High risk medication use - Methotrexate 1 mL  sq injections every 7 days, folic acid 1 mg 2 tablets daily, sulfasalazine 500 mg 2 tablets by mouth twice daily, and Plaquenil 200 mg 1 tablet twice daily. We will call to obtain updated PLQ eye exam.  CBC and BMP updated on 02/24/20.  He is due to update lab work today.  Orders for CBC and CMP were released.  His next lab work will be due in April and every 3 months.  Standing orders for CBC and CMP are in place. - Plan: CBC with Differential/Platelet, COMPLETE METABOLIC PANEL WITH GFR He has not had any recent infections.  Discussed the importance of holding methotrexate if he develops signs or symptoms of an infection and to resume once the infection has completely cleared.   Primary osteoarthritis of both hands: He has PIP and DIP thickening consistent with osteoarthritis of both hands.  No joint tenderness or inflammation was noted on exam.  He is able to make a complete fist bilaterally. He has been experiencing increased stiffness in both hands which he attributes to cooler weather temperatures.  Discussed the importance of joint protection and muscle strengthening.   Arthritis of both acromioclavicular joints - X-rays of both shoulder joints were obtained on 12/04/2018 which were consistent with acromioclavicular joint arthritis.  He has limited ROM of the right shoulder-abduction to about 120 degrees.  Left shoulder has good ROM with no discomfort.   Hx of total knee replacement, bilateral: Performed by Dr. Alvan Dame. Doing well.  He has good ROM with no discomfort.   DDD  (degenerative disc disease), cervical: S/p fusion October 2021 with Dr. Arnoldo Morale.  Doing well.  No complications.  He has limited extension and lateral rotation bilaterally on exam.  His neck pain and symptoms of radiculopathy have resolved since having surgery.  He feels this surgery has been life changing and has given him his life back.  He has been able to start tapering the dose of gabapentin and is not taking any OTC products for pain relief.   DDD (degenerative disc disease), lumbar: He is not experiencing any lower back pain at this time.  No symptoms of radiculopathy.   History of melanoma - He sees Dr. Ronnald Ramp on a yearly basis.   Neuralgia - Resolved. He plans to continue to slowly taper the dose of gabapentin.   Orders: Orders Placed This Encounter  Procedures  . CBC with Differential/Platelet  . COMPLETE METABOLIC PANEL WITH GFR   No orders of the defined types were placed in this encounter.    Follow-Up Instructions: Return in about 5 months (around 11/09/2020) for Rheumatoid arthritis, Osteoarthritis, DDD.   Ofilia Neas, PA-C  Note - This record has been created using Dragon software.  Chart creation errors have been sought, but may not always  have been located. Such creation errors do not reflect on  the standard of medical care.

## 2020-06-11 ENCOUNTER — Ambulatory Visit (INDEPENDENT_AMBULATORY_CARE_PROVIDER_SITE_OTHER): Payer: Medicare Other | Admitting: Physician Assistant

## 2020-06-11 ENCOUNTER — Encounter: Payer: Self-pay | Admitting: Physician Assistant

## 2020-06-11 ENCOUNTER — Other Ambulatory Visit: Payer: Self-pay

## 2020-06-11 VITALS — BP 131/82 | HR 69 | Resp 17 | Ht 68.0 in | Wt 253.0 lb

## 2020-06-11 DIAGNOSIS — Z85820 Personal history of malignant melanoma of skin: Secondary | ICD-10-CM | POA: Diagnosis not present

## 2020-06-11 DIAGNOSIS — Z96653 Presence of artificial knee joint, bilateral: Secondary | ICD-10-CM

## 2020-06-11 DIAGNOSIS — M792 Neuralgia and neuritis, unspecified: Secondary | ICD-10-CM | POA: Diagnosis not present

## 2020-06-11 DIAGNOSIS — M19011 Primary osteoarthritis, right shoulder: Secondary | ICD-10-CM | POA: Diagnosis not present

## 2020-06-11 DIAGNOSIS — M0609 Rheumatoid arthritis without rheumatoid factor, multiple sites: Secondary | ICD-10-CM | POA: Diagnosis not present

## 2020-06-11 DIAGNOSIS — M19041 Primary osteoarthritis, right hand: Secondary | ICD-10-CM | POA: Diagnosis not present

## 2020-06-11 DIAGNOSIS — M19012 Primary osteoarthritis, left shoulder: Secondary | ICD-10-CM | POA: Diagnosis not present

## 2020-06-11 DIAGNOSIS — Z79899 Other long term (current) drug therapy: Secondary | ICD-10-CM

## 2020-06-11 DIAGNOSIS — M5136 Other intervertebral disc degeneration, lumbar region: Secondary | ICD-10-CM | POA: Diagnosis not present

## 2020-06-11 DIAGNOSIS — M19042 Primary osteoarthritis, left hand: Secondary | ICD-10-CM

## 2020-06-11 DIAGNOSIS — M5030 Other cervical disc degeneration, unspecified cervical region: Secondary | ICD-10-CM

## 2020-06-12 LAB — COMPLETE METABOLIC PANEL WITH GFR
AG Ratio: 1.7 (calc) (ref 1.0–2.5)
ALT: 24 U/L (ref 9–46)
AST: 31 U/L (ref 10–35)
Albumin: 4.3 g/dL (ref 3.6–5.1)
Alkaline phosphatase (APISO): 92 U/L (ref 35–144)
BUN: 18 mg/dL (ref 7–25)
CO2: 30 mmol/L (ref 20–32)
Calcium: 9.7 mg/dL (ref 8.6–10.3)
Chloride: 100 mmol/L (ref 98–110)
Creat: 1.02 mg/dL (ref 0.70–1.25)
GFR, Est African American: 87 mL/min/{1.73_m2} (ref >=60)
GFR, Est Non African American: 75 mL/min/{1.73_m2} (ref >=60)
Globulin: 2.5 g/dL (calc) (ref 1.9–3.7)
Glucose, Bld: 96 mg/dL (ref 65–99)
Potassium: 5.2 mmol/L (ref 3.5–5.3)
Sodium: 139 mmol/L (ref 135–146)
Total Bilirubin: 0.5 mg/dL (ref 0.2–1.2)
Total Protein: 6.8 g/dL (ref 6.1–8.1)

## 2020-06-12 LAB — CBC WITH DIFFERENTIAL/PLATELET
Absolute Monocytes: 856 cells/uL (ref 200–950)
Basophils Absolute: 43 cells/uL (ref 0–200)
Basophils Relative: 0.7 %
Eosinophils Absolute: 93 cells/uL (ref 15–500)
Eosinophils Relative: 1.5 %
HCT: 47.8 % (ref 38.5–50.0)
Hemoglobin: 15.7 g/dL (ref 13.2–17.1)
Lymphs Abs: 2077 cells/uL (ref 850–3900)
MCH: 30.2 pg (ref 27.0–33.0)
MCHC: 32.8 g/dL (ref 32.0–36.0)
MCV: 91.9 fL (ref 80.0–100.0)
MPV: 9.8 fL (ref 7.5–12.5)
Monocytes Relative: 13.8 %
Neutro Abs: 3131 cells/uL (ref 1500–7800)
Neutrophils Relative %: 50.5 %
Platelets: 273 10*3/uL (ref 140–400)
RBC: 5.2 10*6/uL (ref 4.20–5.80)
RDW: 14.4 % (ref 11.0–15.0)
Total Lymphocyte: 33.5 %
WBC: 6.2 10*3/uL (ref 3.8–10.8)

## 2020-06-14 NOTE — Progress Notes (Signed)
CBC and CMP WNL

## 2020-06-15 ENCOUNTER — Ambulatory Visit: Payer: Medicare Other | Admitting: Physician Assistant

## 2020-06-15 DIAGNOSIS — M5136 Other intervertebral disc degeneration, lumbar region: Secondary | ICD-10-CM

## 2020-06-15 DIAGNOSIS — M19011 Primary osteoarthritis, right shoulder: Secondary | ICD-10-CM

## 2020-06-15 DIAGNOSIS — Z85820 Personal history of malignant melanoma of skin: Secondary | ICD-10-CM

## 2020-06-15 DIAGNOSIS — M19041 Primary osteoarthritis, right hand: Secondary | ICD-10-CM

## 2020-06-15 DIAGNOSIS — M0609 Rheumatoid arthritis without rheumatoid factor, multiple sites: Secondary | ICD-10-CM

## 2020-06-15 DIAGNOSIS — M5030 Other cervical disc degeneration, unspecified cervical region: Secondary | ICD-10-CM

## 2020-06-15 DIAGNOSIS — Z96653 Presence of artificial knee joint, bilateral: Secondary | ICD-10-CM

## 2020-06-15 DIAGNOSIS — M792 Neuralgia and neuritis, unspecified: Secondary | ICD-10-CM

## 2020-06-15 DIAGNOSIS — Z79899 Other long term (current) drug therapy: Secondary | ICD-10-CM

## 2020-07-07 DIAGNOSIS — D485 Neoplasm of uncertain behavior of skin: Secondary | ICD-10-CM | POA: Diagnosis not present

## 2020-07-07 DIAGNOSIS — Z85828 Personal history of other malignant neoplasm of skin: Secondary | ICD-10-CM | POA: Diagnosis not present

## 2020-07-07 DIAGNOSIS — Z85820 Personal history of malignant melanoma of skin: Secondary | ICD-10-CM | POA: Diagnosis not present

## 2020-07-07 DIAGNOSIS — C44319 Basal cell carcinoma of skin of other parts of face: Secondary | ICD-10-CM | POA: Diagnosis not present

## 2020-07-18 DIAGNOSIS — G8929 Other chronic pain: Secondary | ICD-10-CM | POA: Diagnosis not present

## 2020-07-18 DIAGNOSIS — E782 Mixed hyperlipidemia: Secondary | ICD-10-CM | POA: Diagnosis not present

## 2020-07-18 DIAGNOSIS — M1990 Unspecified osteoarthritis, unspecified site: Secondary | ICD-10-CM | POA: Diagnosis not present

## 2020-07-18 DIAGNOSIS — G4700 Insomnia, unspecified: Secondary | ICD-10-CM | POA: Diagnosis not present

## 2020-07-18 DIAGNOSIS — F322 Major depressive disorder, single episode, severe without psychotic features: Secondary | ICD-10-CM | POA: Diagnosis not present

## 2020-07-18 DIAGNOSIS — I10 Essential (primary) hypertension: Secondary | ICD-10-CM | POA: Diagnosis not present

## 2020-07-18 DIAGNOSIS — M0609 Rheumatoid arthritis without rheumatoid factor, multiple sites: Secondary | ICD-10-CM | POA: Diagnosis not present

## 2020-07-18 DIAGNOSIS — N400 Enlarged prostate without lower urinary tract symptoms: Secondary | ICD-10-CM | POA: Diagnosis not present

## 2020-07-18 DIAGNOSIS — F325 Major depressive disorder, single episode, in full remission: Secondary | ICD-10-CM | POA: Diagnosis not present

## 2020-07-20 DIAGNOSIS — Z981 Arthrodesis status: Secondary | ICD-10-CM | POA: Diagnosis not present

## 2020-07-20 DIAGNOSIS — M4712 Other spondylosis with myelopathy, cervical region: Secondary | ICD-10-CM | POA: Diagnosis not present

## 2020-07-24 ENCOUNTER — Other Ambulatory Visit: Payer: Self-pay | Admitting: Rheumatology

## 2020-07-24 DIAGNOSIS — Z79899 Other long term (current) drug therapy: Secondary | ICD-10-CM

## 2020-07-24 DIAGNOSIS — M0609 Rheumatoid arthritis without rheumatoid factor, multiple sites: Secondary | ICD-10-CM

## 2020-07-26 NOTE — Telephone Encounter (Signed)
Last Visit: 06/11/2020 Next Visit: 11/12/2020 Labs: 06/11/2020, CBC and CMP WNL Eye exam: 02/11/2020   Current Dose per office note 06/11/2020, plaquenil 200 mg 1 tablet by mouth twice daily DX: Rheumatoid arthritis of multiple sites with negative rheumatoid factor   Last Fill: 04/25/2020   Okay to refill Plaquenil?

## 2020-07-28 ENCOUNTER — Other Ambulatory Visit: Payer: Self-pay | Admitting: Rheumatology

## 2020-07-28 DIAGNOSIS — C44319 Basal cell carcinoma of skin of other parts of face: Secondary | ICD-10-CM | POA: Diagnosis not present

## 2020-07-28 DIAGNOSIS — Z85828 Personal history of other malignant neoplasm of skin: Secondary | ICD-10-CM | POA: Diagnosis not present

## 2020-07-28 DIAGNOSIS — Z85820 Personal history of malignant melanoma of skin: Secondary | ICD-10-CM | POA: Diagnosis not present

## 2020-07-28 NOTE — Telephone Encounter (Signed)
Patient's wife calling to request rx for 63ml syringes, and 25 gauge needles for use with MTX, sent to [facility] on [address].

## 2020-07-29 MED ORDER — TUBERCULIN-ALLERGY SYRINGES 27G X 1/2" 1 ML MISC
3 refills | Status: DC
Start: 2020-07-29 — End: 2021-12-26

## 2020-07-29 NOTE — Telephone Encounter (Signed)
Next Visit: 11/12/2020  Last Visit: 06/11/2020  Last Fill: 12/19/2016  Dx: Rheumatoid arthritis of multiple sites with negative rheumatoid factor  Current Dose per office note on 06/11/2020, Methotrexate 1 mL sq injections every 7 days  Okay to refill syringes?

## 2020-08-30 ENCOUNTER — Other Ambulatory Visit: Payer: Self-pay | Admitting: Rheumatology

## 2020-08-30 NOTE — Telephone Encounter (Signed)
Next Visit: 11/12/2020  Last Visit: 06/11/2020  Last Fill: 05/24/2020  DX:  Rheumatoid arthritis of multiple sites with negative rheumatoid factor   Current Dose per office note 06/11/2020, sulfasalazine 500 mg 2 tablets by mouth twice daily  Labs: 06/11/2020, CBC and CMP WNL  Okay to refill SSZ?

## 2020-09-11 DIAGNOSIS — Z23 Encounter for immunization: Secondary | ICD-10-CM | POA: Diagnosis not present

## 2020-10-25 DIAGNOSIS — M1990 Unspecified osteoarthritis, unspecified site: Secondary | ICD-10-CM | POA: Diagnosis not present

## 2020-10-25 DIAGNOSIS — F322 Major depressive disorder, single episode, severe without psychotic features: Secondary | ICD-10-CM | POA: Diagnosis not present

## 2020-10-25 DIAGNOSIS — M0609 Rheumatoid arthritis without rheumatoid factor, multiple sites: Secondary | ICD-10-CM | POA: Diagnosis not present

## 2020-10-25 DIAGNOSIS — E782 Mixed hyperlipidemia: Secondary | ICD-10-CM | POA: Diagnosis not present

## 2020-10-25 DIAGNOSIS — N400 Enlarged prostate without lower urinary tract symptoms: Secondary | ICD-10-CM | POA: Diagnosis not present

## 2020-10-25 DIAGNOSIS — G8929 Other chronic pain: Secondary | ICD-10-CM | POA: Diagnosis not present

## 2020-10-25 DIAGNOSIS — I10 Essential (primary) hypertension: Secondary | ICD-10-CM | POA: Diagnosis not present

## 2020-10-25 DIAGNOSIS — G4700 Insomnia, unspecified: Secondary | ICD-10-CM | POA: Diagnosis not present

## 2020-10-27 ENCOUNTER — Other Ambulatory Visit: Payer: Self-pay | Admitting: *Deleted

## 2020-10-27 DIAGNOSIS — Z79899 Other long term (current) drug therapy: Secondary | ICD-10-CM

## 2020-10-27 DIAGNOSIS — M0609 Rheumatoid arthritis without rheumatoid factor, multiple sites: Secondary | ICD-10-CM

## 2020-10-27 MED ORDER — HYDROXYCHLOROQUINE SULFATE 200 MG PO TABS: 200.0000 mg | ORAL_TABLET | Freq: Two times a day (BID) | ORAL | 2 refills | Status: DC

## 2020-10-27 NOTE — Telephone Encounter (Signed)
Refill request received via fax  Last Visit: 06/11/2020 Next Visit: 11/12/2020 Labs: 06/11/2020, CBC and CMP WNL Eye exam: 02/11/2020   Current Dose per office note 06/11/2020, plaquenil 200 mg 1 tablet by mouth twice daily DX: Rheumatoid arthritis of multiple sites with negative rheumatoid factor   Last Fill: 07/26/2020  Okay to refill PLQ?

## 2020-10-29 NOTE — Progress Notes (Signed)
Office Visit Note  Patient: David [name]             Date of Birth: January 09, 1951           MRN: [id number]             PCP: Orpah Melter, MD Referring: Orpah Melter, MD Visit Date: 11/12/2020 Occupation: @GUAROCC @  Subjective:  Follow-up (Patient is doing well overall. Recently treated for right foot/ankle tendinitis (boot and Prednisone) about 2 months ago. Patient also has issues with left wrist tendinitis. Patient complains of right shoulder pain- waiting on arthroscopic surgery with Dr. Ninfa Linden- planning for November 2022. Patient is interested in having a right shoulder injection today if possible. )   History of Present Illness: David [name] is a 70 y.o. male with a history of seronegative rheumatoid arthritis, osteoarthritis and degenerative disc disease.  He has been having has severe pain and discomfort in his right shoulder.  He is having difficulty lifting his right arm.  He had cervical spine fusion and discectomy in October 2021 which gave him complete relief from radiculopathy.  He is not having much discomfort in his lower back.  He states he has a stiffness in his hands in the morning which last for about 2 to 3 hours.  He denies any joint swelling.  He has been tolerating methotrexate, sulfasalazine and Plaquenil combination well.  He has been having pain and discomfort in his right ankle joint.  He states his ankles are stiff in the morning.  He was seen by podiatrist about 2 months ago who gave him prednisone taper and placed him in a boot.  He states his symptoms persist despite being in the boot and taking the prednisone.  Activities of Daily Living:  Patient reports morning stiffness for 2-3 hours.   Patient Denies nocturnal pain.  Difficulty dressing/grooming: Denies Difficulty climbing stairs: Denies Difficulty getting out of chair: Denies Difficulty using hands for taps, buttons, cutlery, and/or writing: Reports  Review of Systems  Constitutional:   Negative for fatigue.  HENT:  Positive for mouth dryness. Negative for mouth sores and nose dryness.   Eyes:  Negative for pain, itching, visual disturbance and dryness.  Respiratory:  Negative for cough, hemoptysis, shortness of breath and difficulty breathing.   Cardiovascular:  Negative for chest pain, palpitations and swelling in legs/feet.  Gastrointestinal:  Negative for abdominal pain, blood in stool, constipation and diarrhea.  Endocrine: Negative for increased urination.  Genitourinary:  Negative for painful urination.  Musculoskeletal:  Positive for joint pain, joint pain, joint swelling and morning stiffness. Negative for myalgias, muscle weakness, muscle tenderness and myalgias.  Skin:  Negative for color change, rash and redness.  Allergic/Immunologic: Negative for susceptible to infections.  Neurological:  Negative for dizziness, numbness, headaches, memory loss and weakness.  Hematological:  Negative for swollen glands.  Psychiatric/Behavioral:  Negative for confusion and sleep disturbance.    PMFS History:  Patient Active Problem List   Diagnosis Date Noted   Cervical spondylosis with radiculopathy 02/26/2020   SBO (small bowel obstruction) ([place]) 08/21/2017   Trochanteric bursitis of left hip 01/18/2017   Lateral epicondylitis, right elbow 01/18/2017   Rheumatoid arthritis ([place]) 03/15/2016   DJD (degenerative joint disease), cervical 03/15/2016   DDD (degenerative disc disease), lumbar 03/15/2016   HTN (hypertension) 03/15/2016   Obesity 03/15/2016   Elevated cholesterol 03/15/2016   Neuralgia 03/15/2016   Malignant melanoma of right side of neck ([place]) 03/15/2016   BBB (bundle branch block) 03/15/2016  High risk medication use 03/15/2016   Osteoarthritis of lumbar spine 03/15/2016   Primary osteoarthritis of both hands 03/15/2016   Primary osteoarthritis of both knees 03/15/2016   Mesenteric adenitis 06/29/2015    Past Medical History:  Diagnosis Date    Arthritis    BBB (bundle branch block) 03/15/2016   Carpal tunnel syndrome    DDD (degenerative disc disease), lumbar 03/15/2016   Depression    Dysrhythmia    had some tachycardia with steroids   Elevated cholesterol 03/15/2016   Headache(784.0)    Ocular Migraines - takes Ambien for it   Heart murmur    HTN (hypertension) 03/15/2016   Hyperlipemia    Hypertension    Malignant melanoma of right side of neck ([place]) 03/15/2016   30 years ago    Neuralgia 03/15/2016   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    RLS (restless legs syndrome)    Sleep apnea    could not use a cpap   Snores    Status post gastric banding    Wears glasses    driving    Family History  Problem Relation Age of Onset   Heart disease Mother    Alzheimer's disease Father    Past Surgical History:  Procedure Laterality Date   ANTERIOR CERVICAL DECOMPRESSION/DISCECTOMY FUSION 4 LEVELS N/A 02/26/2020   Procedure: ANTERIOR CERVICAL DECOMPRESSION/DISCECTOMY FUSION, INTERBODY PROSTHESIS, PLATE/SCREWS CERVICAL THREE-FOUR CERVICAL FOUR-FIVE CERVICAL FIVE-SIX- CERVICAL SIX-SEVEN;  Surgeon: Newman Pies, MD;  Location: [place];  Service: Neurosurgery;  Laterality: N/A;   CARPAL TUNNEL RELEASE Right 09/26/2012   Procedure: CARPAL TUNNEL RELEASE;  Surgeon: Tennis Must, MD;  Location: [place];  Service: Orthopedics;  Laterality: Right;   CARPAL TUNNEL RELEASE Left 10/24/2012   Procedure: CARPAL TUNNEL RELEASE;  Surgeon: Tennis Must, MD;  Location: [place];  Service: Orthopedics;  Laterality: Left;   EXCISION MELANOMA WITH SENTINEL LYMPH NODE BIOPSY  1984   rt neck with mulpipal nodes and some muscle excised rt neck-shoulder   KNEE ARTHROPLASTY     knee replacement     LAPAROSCOPIC GASTRIC BANDING  03/15/2009   LAPAROSCOPIC GASTRIC BANDING     MUSCLE BIOPSY  2004   rt thigh to r/o myocitis   nerve conduction  velosity test     TONSILLECTOMY     TOTAL KNEE ARTHROPLASTY Bilateral 12/05/2010   TRIGGER FINGER RELEASE Right 09/26/2012   Procedure: RELEASE TRIGGER FINGER/A-1 PULLEY RING AND SMALL ;  Surgeon: Tennis Must, MD;  Location: [place];  Service: Orthopedics;  Laterality: Right;   TRIGGER FINGER RELEASE Left 10/24/2012   Procedure: RELEASE TRIGGER FINGER/A-1 PULLEY LEFT MIDDLE AND LEFT RING;  Surgeon: Tennis Must, MD;  Location: [place] [facility];  Service: Orthopedics;  Laterality: Left;   Social History   Social History Narrative   Not on file   Immunization History  Administered Date(s) Administered   Moderna Sars-Covid-2 Vaccination 06/10/2019, 07/08/2019   PFIZER(Purple Top)SARS-COV-2 Vaccination 05/21/2020     Objective: Vital Signs: BP 126/82 (BP Location: Left Arm, Patient Position: Sitting, Cuff Size: Large)   Pulse 72   Ht 5\' 8"  (1.727 m)   Wt 260 lb (117.9 kg)   BMI 39.53 kg/m    Physical Exam Vitals and nursing note reviewed.  Constitutional:      Appearance: He is well-developed.  HENT:     Head:  Normocephalic and atraumatic.  Eyes:     Conjunctiva/sclera: Conjunctivae normal.     Pupils: Pupils are equal, round, and reactive to light.  Cardiovascular:     Rate and Rhythm: Normal rate and regular rhythm.     Heart sounds: Normal heart sounds.  Pulmonary:     Effort: Pulmonary effort is normal.     Breath sounds: Normal breath sounds.  Abdominal:     General: Bowel sounds are normal.     Palpations: Abdomen is soft.  Musculoskeletal:     Cervical back: Normal range of motion and neck supple.  Skin:    General: Skin is warm and dry.     Capillary Refill: Capillary refill takes less than 2 seconds.     Comments: Impetigo was noted on his upper and lower extremities.  Neurological:     Mental Status: He is alert and oriented to person, place, and time.  Psychiatric:        Behavior: Behavior normal.     Musculoskeletal Exam: He  had limited range of motion of cervical spine without any discomfort.  He had right shoulder joint abduction limited to about 100 degrees with discomfort.  He had some limitation with internal rotation of the right shoulder.  Elbow joints, wrist joints were in good range of motion.  He had PIP and DIP thickening.  Hip joints in good range of motion.  His knee joints are replaced without any warmth swelling or effusion.  There was no tenderness over ankles or MTPs.  CDAI Exam: CDAI Score: 0.2  Patient Global: 1 mm; Provider Global: 1 mm Swollen: 0 ; Tender: 2  Joint Exam 11/12/2020      Right  Left  Acromioclavicular   Tender     Ankle   Tender        Investigation: No additional findings.  Imaging: XR Shoulder Right  Result Date: 11/12/2020 No glenohumeral joint space narrowing was noted.  Acromioclavicular narrowing was noted.  No chondrocalcinosis was noted. Impression: These findings is consistent with acromioclavicular arthritis.   Recent Labs: Lab Results  Component Value Date   WBC 6.2 06/11/2020   HGB 15.7 06/11/2020   PLT 273 06/11/2020   NA 139 06/11/2020   K 5.2 06/11/2020   CL 100 06/11/2020   CO2 30 06/11/2020   GLUCOSE 96 06/11/2020   BUN 18 06/11/2020   CREATININE 1.02 06/11/2020   BILITOT 0.5 06/11/2020   ALKPHOS 72 09/14/2018   AST 31 06/11/2020   ALT 24 06/11/2020   PROT 6.8 06/11/2020   ALBUMIN 3.8 09/14/2018   CALCIUM 9.7 06/11/2020   GFRAA 87 06/11/2020    Speciality Comments: PLQ Eye Exam: 02/11/2020 WNL @ [facility]  F/u in 1 year.   Procedures:  Large Joint Inj: R subacromial bursa on 11/12/2020 11:15 AM Indications: pain Details: 27 G 1.5 in needle, posterior approach  Arthrogram: No  Medications: 1.5 mL lidocaine 1 %; 40 mg triamcinolone acetonide 40 MG/ML Aspirate: 0 mL Outcome: tolerated well, no immediate complications Procedure, treatment alternatives, risks and benefits explained, specific risks discussed. Consent was given by the  patient. Immediately prior to procedure a time out was called to verify the correct patient, procedure, equipment, support staff and site/side marked as required. Patient was prepped and draped in the usual sterile fashion.    Allergies: Codeine, Dilaudid [hydromorphone hcl], Fentanyl, Nucynta [tapentadol], and Penicillins   Assessment / Plan:     Visit Diagnoses: Rheumatoid arthritis of multiple sites with  negative rheumatoid factor ([place]) -he gives history of some stiffness in the morning.  He has not noticed much swelling.  He states sometimes  strenuous activities cause swelling.  He had no synovitis on my examination.  Plan: Sedimentation rate  High risk medication use - Methotrexate 1 mL sq injections every 7 days, folic acid 1 mg 2 tablets daily, sulfasalazine 500 mg 2 tablets by mouth twice daily, Plaquenil 200 mg BID -he has not had labs since January.  He was advised to get labs every 3 months.  Need to monitor for drug toxicity was emphasized.  Plan: CBC with Differential/Platelet, COMPLETE METABOLIC PANEL WITH GFR today and then every 3 months.  He was advised to get a fourth dose for COVID-19 (booster).  Instructions regarding the immunization were placed in the AVS.  He was also advised to stop the medications in case he develops an infection and resume medications once the infection resolves.  Primary osteoarthritis of both hands-he has osteoarthritis involving his hands.  He has bilateral PIP and DIP prominence and decreased grip strength.  Arthritis of both acromioclavicular joints -he was having increased pain and discomfort in his right acromioclavicular joint with limited range of motion.  He plans to have surgery by Dr. Ninfa Linden in the future.  Per his request right shoulder joint was injected with cortisone as described above.  He tolerated the procedure well.  Bursitis of right shoulder -he has been experiencing increased pain and discomfort in his right shoulder joint.  Plan: XR  Shoulder Right.  X-ray was consistent with acromioclavicular arthritis.  X-ray findings were discussed with the patient.  Hx of total knee replacement, bilateral - Performed by Dr. Alvan Dame.  Doing well.  DDD (degenerative disc disease), cervical - S/p fusion and discectomy October 2021 with Dr. Arnoldo Morale.  He states the radiculopathy stopped after the cervical spine fusion.  He has limited range of motion without discomfort.  DDD (degenerative disc disease), lumbar-he continues to have some lower back pain.  History of melanoma - He sees Dr. Ronnald Ramp on a yearly basis.   Impetigo-he had several impetigo lesions on his upper and lower extremities.  I advised him to use Dial soap.  I will also call in a prescription for Keflex 500 mg p.o. 3 times daily for 7 days.  If his symptoms persist he should follow-up with his PCP.  Patient confirmed that he is allergic to penicillin but he has taken Keflex in the past.  Orders: Orders Placed This Encounter  Procedures   Large Joint Inj   XR Shoulder Right   CBC with Differential/Platelet   COMPLETE METABOLIC PANEL WITH GFR   Sedimentation rate   Meds ordered this encounter  Medications   cephALEXin (KEFLEX) 500 MG capsule    Sig: Take 1 capsule (500 mg total) by mouth 3 (three) times daily for 7 days.    Dispense:  21 capsule    Refill:  0     Follow-Up Instructions: Return in about 5 months (around 04/14/2021) for Rheumatoid arthritis.   Bo Merino, MD  Note - This record has been created using Editor, commissioning.  Chart creation errors have been sought, but may not always  have been located. Such creation errors do not reflect on  the standard of medical care.

## 2020-11-04 DIAGNOSIS — E782 Mixed hyperlipidemia: Secondary | ICD-10-CM | POA: Diagnosis not present

## 2020-11-04 DIAGNOSIS — M25571 Pain in right ankle and joints of right foot: Secondary | ICD-10-CM | POA: Diagnosis not present

## 2020-11-04 DIAGNOSIS — M0609 Rheumatoid arthritis without rheumatoid factor, multiple sites: Secondary | ICD-10-CM | POA: Diagnosis not present

## 2020-11-04 DIAGNOSIS — F322 Major depressive disorder, single episode, severe without psychotic features: Secondary | ICD-10-CM | POA: Diagnosis not present

## 2020-11-04 DIAGNOSIS — M25532 Pain in left wrist: Secondary | ICD-10-CM | POA: Diagnosis not present

## 2020-11-04 DIAGNOSIS — M1990 Unspecified osteoarthritis, unspecified site: Secondary | ICD-10-CM | POA: Diagnosis not present

## 2020-11-04 DIAGNOSIS — G8929 Other chronic pain: Secondary | ICD-10-CM | POA: Diagnosis not present

## 2020-11-04 DIAGNOSIS — N400 Enlarged prostate without lower urinary tract symptoms: Secondary | ICD-10-CM | POA: Diagnosis not present

## 2020-11-04 DIAGNOSIS — G2581 Restless legs syndrome: Secondary | ICD-10-CM | POA: Diagnosis not present

## 2020-11-04 DIAGNOSIS — I10 Essential (primary) hypertension: Secondary | ICD-10-CM | POA: Diagnosis not present

## 2020-11-04 DIAGNOSIS — F325 Major depressive disorder, single episode, in full remission: Secondary | ICD-10-CM | POA: Diagnosis not present

## 2020-11-12 ENCOUNTER — Encounter: Payer: Self-pay | Admitting: Rheumatology

## 2020-11-12 ENCOUNTER — Other Ambulatory Visit: Payer: Self-pay

## 2020-11-12 ENCOUNTER — Ambulatory Visit (INDEPENDENT_AMBULATORY_CARE_PROVIDER_SITE_OTHER): Payer: Medicare Other | Admitting: Rheumatology

## 2020-11-12 ENCOUNTER — Ambulatory Visit: Payer: Self-pay

## 2020-11-12 VITALS — BP 126/82 | HR 72 | Ht 68.0 in | Wt 260.0 lb

## 2020-11-12 DIAGNOSIS — L0100 Impetigo, unspecified: Secondary | ICD-10-CM | POA: Diagnosis not present

## 2020-11-12 DIAGNOSIS — M19042 Primary osteoarthritis, left hand: Secondary | ICD-10-CM | POA: Diagnosis not present

## 2020-11-12 DIAGNOSIS — M7551 Bursitis of right shoulder: Secondary | ICD-10-CM

## 2020-11-12 DIAGNOSIS — Z79899 Other long term (current) drug therapy: Secondary | ICD-10-CM

## 2020-11-12 DIAGNOSIS — M19041 Primary osteoarthritis, right hand: Secondary | ICD-10-CM | POA: Diagnosis not present

## 2020-11-12 DIAGNOSIS — M19012 Primary osteoarthritis, left shoulder: Secondary | ICD-10-CM | POA: Diagnosis not present

## 2020-11-12 DIAGNOSIS — Z85820 Personal history of malignant melanoma of skin: Secondary | ICD-10-CM | POA: Diagnosis not present

## 2020-11-12 DIAGNOSIS — M5136 Other intervertebral disc degeneration, lumbar region: Secondary | ICD-10-CM

## 2020-11-12 DIAGNOSIS — M0609 Rheumatoid arthritis without rheumatoid factor, multiple sites: Secondary | ICD-10-CM

## 2020-11-12 DIAGNOSIS — Z96653 Presence of artificial knee joint, bilateral: Secondary | ICD-10-CM

## 2020-11-12 DIAGNOSIS — M19011 Primary osteoarthritis, right shoulder: Secondary | ICD-10-CM

## 2020-11-12 DIAGNOSIS — M5030 Other cervical disc degeneration, unspecified cervical region: Secondary | ICD-10-CM | POA: Diagnosis not present

## 2020-11-12 LAB — SEDIMENTATION RATE: Sed Rate: 6 mm/h (ref 0–20)

## 2020-11-12 LAB — CBC WITH DIFFERENTIAL/PLATELET
Absolute Monocytes: 1019 cells/uL — ABNORMAL HIGH (ref 200–950)
Basophils Absolute: 47 cells/uL (ref 0–200)
Basophils Relative: 0.6 %
Eosinophils Absolute: 166 cells/uL (ref 15–500)
Eosinophils Relative: 2.1 %
HCT: 48.2 % (ref 38.5–50.0)
Hemoglobin: 15.7 g/dL (ref 13.2–17.1)
Lymphs Abs: 2481 cells/uL (ref 850–3900)
MCH: 31.2 pg (ref 27.0–33.0)
MCHC: 32.6 g/dL (ref 32.0–36.0)
MCV: 95.6 fL (ref 80.0–100.0)
MPV: 9.2 fL (ref 7.5–12.5)
Monocytes Relative: 12.9 %
Neutro Abs: 4187 cells/uL (ref 1500–7800)
Neutrophils Relative %: 53 %
Platelets: 298 10*3/uL (ref 140–400)
RBC: 5.04 10*6/uL (ref 4.20–5.80)
RDW: 14.3 % (ref 11.0–15.0)
Total Lymphocyte: 31.4 %
WBC: 7.9 10*3/uL (ref 3.8–10.8)

## 2020-11-12 LAB — COMPLETE METABOLIC PANEL WITH GFR
AG Ratio: 1.6 (calc) (ref 1.0–2.5)
ALT: 25 U/L (ref 9–46)
AST: 32 U/L (ref 10–35)
Albumin: 4.3 g/dL (ref 3.6–5.1)
Alkaline phosphatase (APISO): 88 U/L (ref 35–144)
BUN: 17 mg/dL (ref 7–25)
CO2: 29 mmol/L (ref 20–32)
Calcium: 9.8 mg/dL (ref 8.6–10.3)
Chloride: 102 mmol/L (ref 98–110)
Creat: 0.98 mg/dL (ref 0.70–1.25)
GFR, Est African American: 91 mL/min/{1.73_m2} (ref >=60)
GFR, Est Non African American: 79 mL/min/{1.73_m2} (ref >=60)
Globulin: 2.7 g/dL (calc) (ref 1.9–3.7)
Glucose, Bld: 91 mg/dL (ref 65–99)
Potassium: 4.7 mmol/L (ref 3.5–5.3)
Sodium: 138 mmol/L (ref 135–146)
Total Bilirubin: 0.6 mg/dL (ref 0.2–1.2)
Total Protein: 7 g/dL (ref 6.1–8.1)

## 2020-11-12 IMAGING — MR MR SHOULDER*R* W/O CM
4 of 5 series · 21 of 40 positions shown · non-contrast
Comparison: Right shoulder x-rays dated December 04, 2018.

CLINICAL DATA: Chronic right-sided neck and shoulder

EXAM:
MRI OF THE RIGHT SHOULDER WITHOUT CONTRAST
TECHNIQUE: Multiplanar, multisequence MR imaging of the shoulder was performed.
No intravenous contrast was administered.

[Series 7: PD fat-sat · axial · right · 4.0mm · 0.44mm/px · z∈[-63,+53]mm · 8 of 25 slices shown (1 of 2)]
[im 1/25]
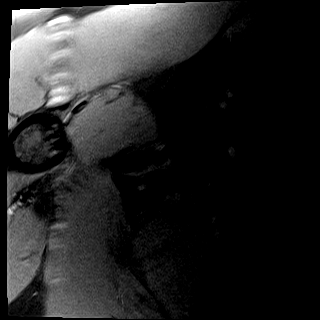
[im 4/25]
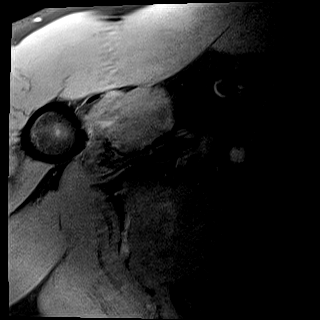
[im 7/25]
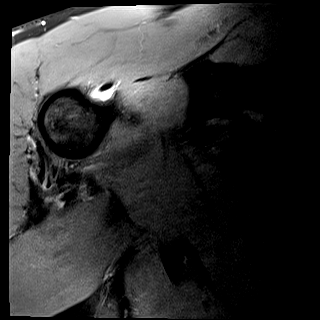
[im 11/25]
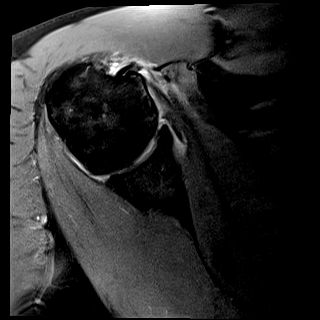
[im 14/25]
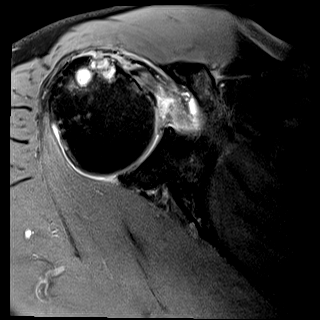
[im 18/25]
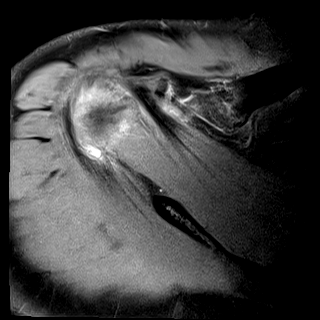
[im 21/25]
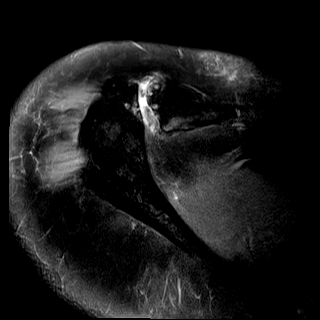
[im 25/25]
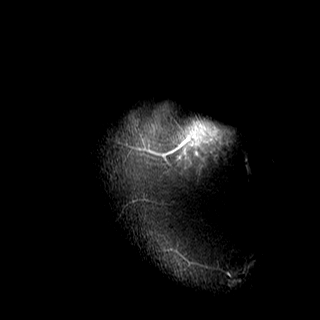

[Series 8: T2 fat-sat · oblique · right · 4.0mm · 0.22mm/px · 3 of 26 slices shown (1 of 2)]
[im 4/26]
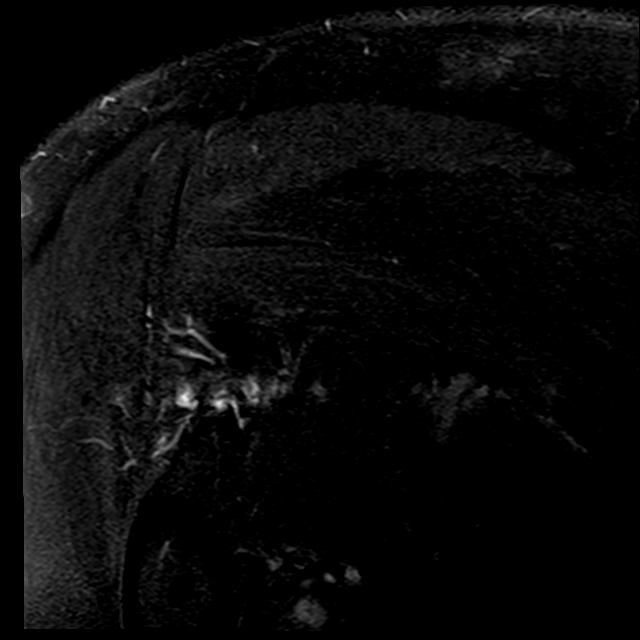
[im 15/26]
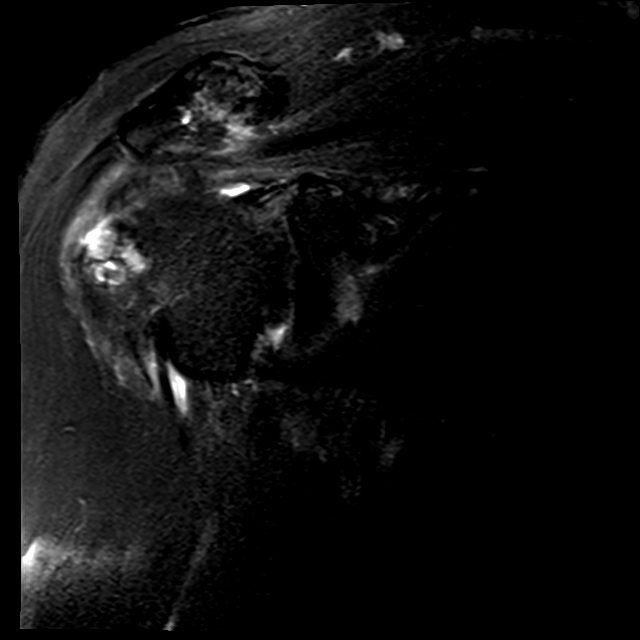
[im 22/26]
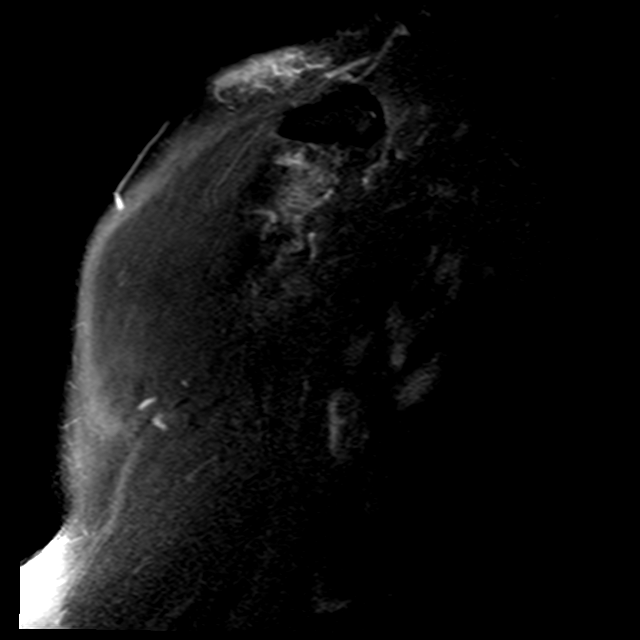

[Series 9: T2 fat-sat · oblique · right · 4.0mm · 0.44mm/px · 3 of 28 slices shown (2 of 2)]
[im 4/28]
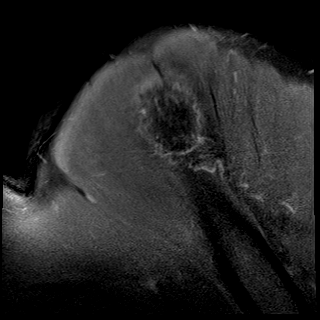
[im 16/28]
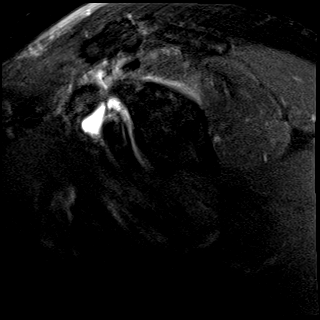
[im 24/28]
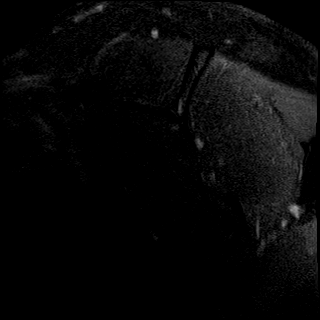

[Series 10: PD fat-sat · oblique · right · 4.0mm · 0.22mm/px · 7 of 26 slices shown (2 of 2)]
[im 1/26]
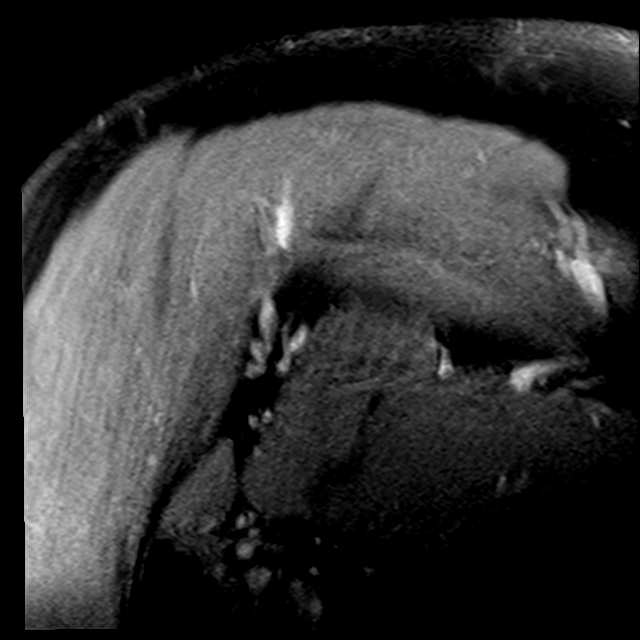
[im 4/26]
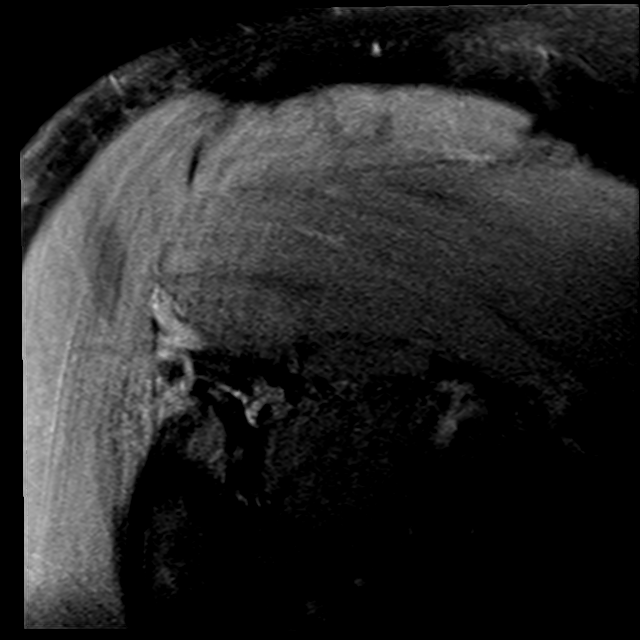
[im 8/26]
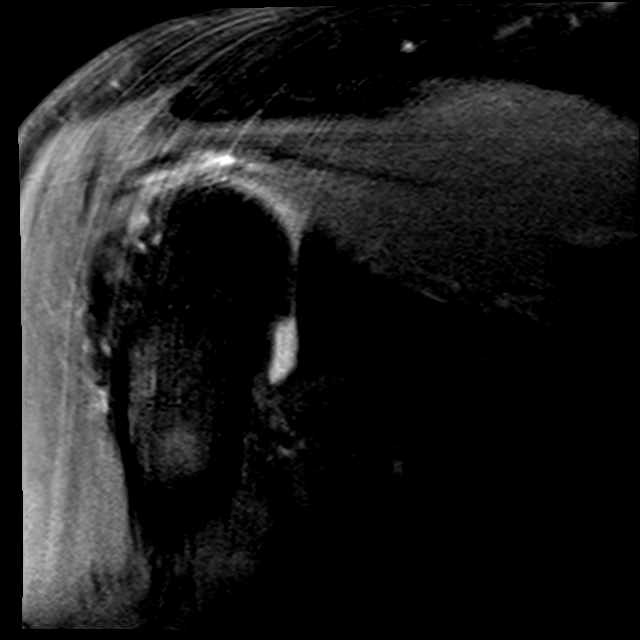
[im 11/26]
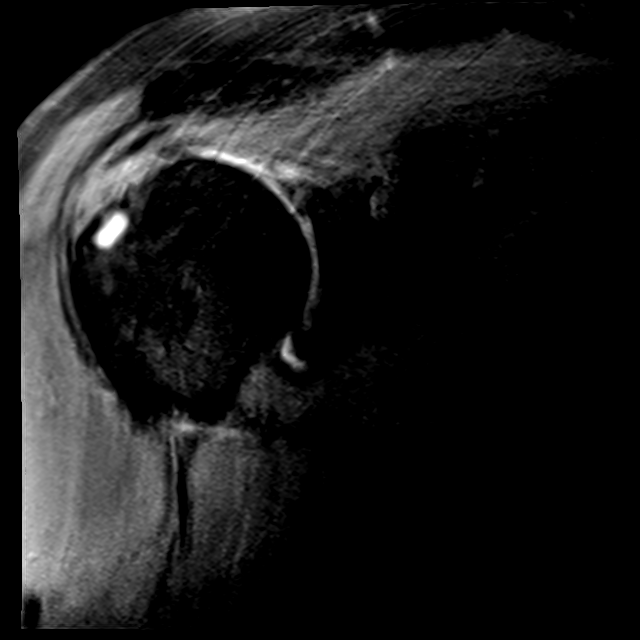
[im 15/26]
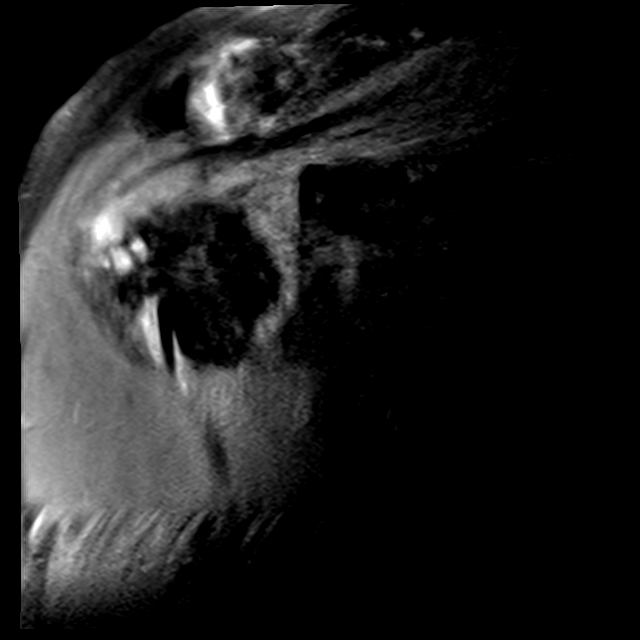
[im 18/26]
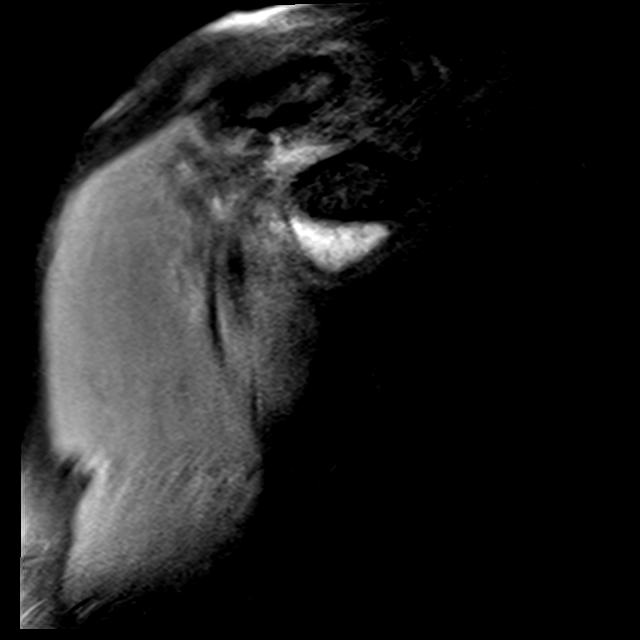
[im 22/26]
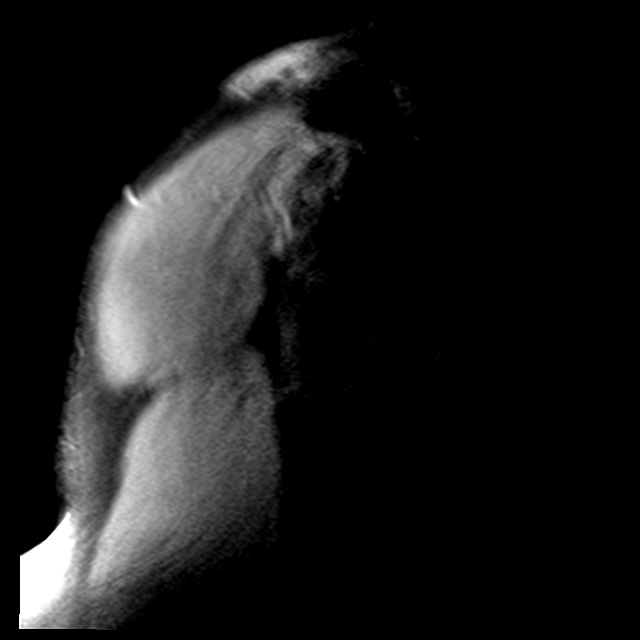

[21 of 40 positions shown; findings below may reference images not displayed]

FINDINGS: Rotator cuff: Severe supraspinatus tendinosis with small focal
low-grade partial-thickness bursal surface tear anteriorly at the
insertion. Moderate infraspinatus tendinosis with small ganglion
cyst along the myotendinous junction. Small low-grade
partial-thickness articular surface tear of the superior
subscapularis tendon at the insertion. The teres minor tendon is
unremarkable.

Muscles: No atrophy or abnormal signal of the muscles of the rotator
cuff.

Biceps long head: Intact and normally positioned. Severe
intra-articular tendinosis.

Acromioclavicular Joint: Moderate arthropathy of the
acromioclavicular joint. Type II acromion. No subacromial/subdeltoid
bursal fluid.

Glenohumeral Joint: Small joint effusion. Diffuse cartilage thinning
without focal defect.

Labrum:  Degenerated but intact.

Bones: Prominent reactive subcortical cystic change in the greater
tuberosity. No acute fracture or dislocation. No suspicious bone
lesion.

Other: None.
IMPRESSION: 1. Severe supraspinatus tendinosis with small focal low-grade
partial-thickness bursal surface tear anteriorly at the insertion.
2. Small low-grade partial-thickness articular surface tear of the
superior subscapularis tendon at the insertion.
3. Moderate infraspinatus and severe intra-articular biceps
tendinosis.
4. Moderate acromioclavicular osteoarthritis.

## 2020-11-12 MED ORDER — TRIAMCINOLONE ACETONIDE 40 MG/ML IJ SUSP
40.0000 mg | INTRAMUSCULAR | Status: AC | PRN
  Administered 2020-11-12: 40 mg via INTRA_ARTICULAR

## 2020-11-12 MED ORDER — CEPHALEXIN 500 MG PO CAPS: 500.0000 mg | ORAL_CAPSULE | Freq: Three times a day (TID) | ORAL | 0 refills | Status: AC

## 2020-11-12 MED ORDER — LIDOCAINE HCL 1 % IJ SOLN
1.5000 mL | INTRAMUSCULAR | Status: AC | PRN
  Administered 2020-11-12: 1.5 mL

## 2020-11-12 NOTE — Patient Instructions (Signed)
Standing Labs We placed an order today for your standing lab work.   Please have your standing labs drawn in September and every 3 months  If possible, please have your labs drawn 2 weeks prior to your appointment so that the provider can discuss your results at your appointment.  Please note that you may see your imaging and lab results in MyChart before we have reviewed them. We may be awaiting multiple results to interpret others before contacting you. Please allow our office up to 72 hours to thoroughly review all of the results before contacting the office for clarification of your results.  We have open lab daily: Monday through Thursday from 1:30-4:30 PM and Friday from 1:30-4:00 PM at the office of Dr. Oslo Huntsman, [facility].   Please be advised, all patients with office appointments requiring lab work will take precedent over walk-in lab work.  If possible, please come for your lab work on Monday and Friday afternoons, as you may experience shorter wait times. The office is located at [address], [address], [place], [place] [address] No appointment is necessary.   Labs are drawn by [facility]. Please bring your co-pay at the time of your lab draw.  You may receive a bill from [facility] for your lab work.  If you wish to have your labs drawn at another location, please call the office 24 hours in advance to send orders.  If you have any questions regarding directions or hours of operation,  please call [phone].   As a reminder, please drink plenty of water prior to coming for your lab work. Thanks! 

## 2020-11-14 NOTE — Progress Notes (Signed)
CBC,and CMP are stable. ESR is normal. Labs do not indicate any inflammation. No change in tt advised.

## 2020-12-10 ENCOUNTER — Other Ambulatory Visit: Payer: Self-pay

## 2020-12-10 NOTE — Telephone Encounter (Signed)
Next Visit: 04/13/2021  Last Visit: 11/12/2020  Last Fill: 08/30/2020  DX: Rheumatoid arthritis of multiple sites with negative rheumatoid factor  Current Dose per office note 11/12/2020: sulfasalazine 500 mg 2 tablets by mouth twice daily  Labs: 11/12/2020 CBC,and CMP are stable. ESR is normal. Labs do not indicate any inflammation  Okay to refill SSZ?

## 2020-12-10 NOTE — Telephone Encounter (Signed)
Patient's wife Manuela Schwartz called requesting prescription refill of Sulfasalazine to be sent to [facility] in [facility].

## 2020-12-13 MED ORDER — SULFASALAZINE 500 MG PO TABS
1000.0000 mg | ORAL_TABLET | Freq: Two times a day (BID) | ORAL | 0 refills | Status: DC
Start: 2020-12-13 — End: 2021-04-26

## 2020-12-16 DIAGNOSIS — G8929 Other chronic pain: Secondary | ICD-10-CM | POA: Diagnosis not present

## 2020-12-16 DIAGNOSIS — I10 Essential (primary) hypertension: Secondary | ICD-10-CM | POA: Diagnosis not present

## 2020-12-16 DIAGNOSIS — F322 Major depressive disorder, single episode, severe without psychotic features: Secondary | ICD-10-CM | POA: Diagnosis not present

## 2020-12-16 DIAGNOSIS — E782 Mixed hyperlipidemia: Secondary | ICD-10-CM | POA: Diagnosis not present

## 2020-12-16 DIAGNOSIS — M0609 Rheumatoid arthritis without rheumatoid factor, multiple sites: Secondary | ICD-10-CM | POA: Diagnosis not present

## 2020-12-16 DIAGNOSIS — M1990 Unspecified osteoarthritis, unspecified site: Secondary | ICD-10-CM | POA: Diagnosis not present

## 2020-12-16 DIAGNOSIS — F325 Major depressive disorder, single episode, in full remission: Secondary | ICD-10-CM | POA: Diagnosis not present

## 2020-12-16 DIAGNOSIS — N400 Enlarged prostate without lower urinary tract symptoms: Secondary | ICD-10-CM | POA: Diagnosis not present

## 2020-12-16 DIAGNOSIS — G4700 Insomnia, unspecified: Secondary | ICD-10-CM | POA: Diagnosis not present

## 2020-12-31 ENCOUNTER — Other Ambulatory Visit: Payer: Self-pay | Admitting: Rheumatology

## 2021-01-03 NOTE — Telephone Encounter (Signed)
Next Visit: 04/13/2021  Last Visit: 11/12/2020  DX: Rheumatoid arthritis of multiple sites with negative rheumatoid factor   Current Dose per office note 11/12/2020: Methotrexate 1 mL sq injections every 7 days  Labs: 11/12/2020 CBC,and CMP are stable. ESR is normal. Labs do not indicate any inflammation  Okay to refill MTX?

## 2021-01-20 ENCOUNTER — Other Ambulatory Visit: Payer: Self-pay | Admitting: Physician Assistant

## 2021-01-20 DIAGNOSIS — Z79899 Other long term (current) drug therapy: Secondary | ICD-10-CM

## 2021-01-20 DIAGNOSIS — M0609 Rheumatoid arthritis without rheumatoid factor, multiple sites: Secondary | ICD-10-CM

## 2021-01-20 NOTE — Telephone Encounter (Signed)
Next Visit: 04/13/2021  Last Visit: 11/12/2020  Labs: 11/12/2020, CBC,and CMP are stable. ESR is normal. Labs do not indicate any inflammation. No change in tt advised.  Eye exam: 02/11/2020 WNL   Current Dose per office note 11/12/2020: Plaquenil 200 mg BID   SN:[id number] arthritis of multiple sites with negative rheumatoid factor   Last Fill: 10/27/2020  Okay to refill Plaquenil?

## 2021-01-25 DIAGNOSIS — M542 Cervicalgia: Secondary | ICD-10-CM | POA: Diagnosis not present

## 2021-01-25 DIAGNOSIS — I10 Essential (primary) hypertension: Secondary | ICD-10-CM | POA: Diagnosis not present

## 2021-01-25 DIAGNOSIS — Z6839 Body mass index (BMI) 39.0-39.9, adult: Secondary | ICD-10-CM | POA: Diagnosis not present

## 2021-01-25 DIAGNOSIS — Z981 Arthrodesis status: Secondary | ICD-10-CM | POA: Diagnosis not present

## 2021-02-16 DIAGNOSIS — Z23 Encounter for immunization: Secondary | ICD-10-CM | POA: Diagnosis not present

## 2021-03-21 DIAGNOSIS — Z23 Encounter for immunization: Secondary | ICD-10-CM | POA: Diagnosis not present

## 2021-03-30 NOTE — Progress Notes (Deleted)
Office Visit Note  Patient: David [name]             Date of Birth: 28-Nov-1950           MRN: [id number]             PCP: Orpah Melter, MD Referring: Orpah Melter, MD Visit Date: 04/13/2021 Occupation: '@GUAROCC' @  Subjective:    History of Present Illness: David [name] is a 70 y.o. male with history of seronegative rheumatoid arthritis, osteoarthritis, and DDD.  Patient is on methotrexate 1.0 mL sq injections once weekly, folic acid 2 mg daily, sulfasalazine 500 mg 2 tablets by mouth twice daily, and Plaquenil 200 mg 1 tablet by mouth twice daily.  CBC and CMP ordered on 11/12/2020.  He is overdue to update lab work.  Orders for CBC and CMP were released.  His next lab work will be due in February and every 3 months to monitor for drug toxicity.  Standing orders for CBC and CMP were placed today. ESR was 6 on 11/12/2020.  Activities of Daily Living:  Patient reports morning stiffness for *** {minute/hour:19697}.   Patient {ACTIONS;DENIES/REPORTS:21021675::"Denies"} nocturnal pain.  Difficulty dressing/grooming: {ACTIONS;DENIES/REPORTS:21021675::"Denies"} Difficulty climbing stairs: {ACTIONS;DENIES/REPORTS:21021675::"Denies"} Difficulty getting out of chair: {ACTIONS;DENIES/REPORTS:21021675::"Denies"} Difficulty using hands for taps, buttons, cutlery, and/or writing: {ACTIONS;DENIES/REPORTS:21021675::"Denies"}  No Rheumatology ROS completed.   PMFS History:  Patient Active Problem List   Diagnosis Date Noted   Cervical spondylosis with radiculopathy 02/26/2020   SBO (small bowel obstruction) ([place]) 08/21/2017   Trochanteric bursitis of left hip 01/18/2017   Lateral epicondylitis, right elbow 01/18/2017   Rheumatoid arthritis ([place]) 03/15/2016   DJD (degenerative joint disease), cervical 03/15/2016   DDD (degenerative disc disease), lumbar 03/15/2016   HTN (hypertension) 03/15/2016   Obesity 03/15/2016   Elevated cholesterol 03/15/2016   Neuralgia 03/15/2016    Malignant melanoma of right side of neck ([place]) 03/15/2016   BBB (bundle branch block) 03/15/2016   High risk medication use 03/15/2016   Osteoarthritis of lumbar spine 03/15/2016   Primary osteoarthritis of both hands 03/15/2016   Primary osteoarthritis of both knees 03/15/2016   Mesenteric adenitis 06/29/2015    Past Medical History:  Diagnosis Date   Arthritis    BBB (bundle branch block) 03/15/2016   Carpal tunnel syndrome    DDD (degenerative disc disease), lumbar 03/15/2016   Depression    Dysrhythmia    had some tachycardia with steroids   Elevated cholesterol 03/15/2016   Headache(784.0)    Ocular Migraines - takes Ambien for it   Heart murmur    HTN (hypertension) 03/15/2016   Hyperlipemia    Hypertension    Malignant melanoma of right side of neck ([place]) 03/15/2016   30 years ago    Neuralgia 03/15/2016   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    RLS (restless legs syndrome)    Sleep apnea    could not use a cpap   Snores    Status post gastric banding    Wears glasses    driving    Family History  Problem Relation Age of Onset   Heart disease Mother    Alzheimer's disease Father    Past Surgical History:  Procedure Laterality Date   ANTERIOR CERVICAL DECOMPRESSION/DISCECTOMY FUSION 4 LEVELS N/A 02/26/2020   Procedure: ANTERIOR CERVICAL DECOMPRESSION/DISCECTOMY FUSION, INTERBODY PROSTHESIS, PLATE/SCREWS CERVICAL THREE-FOUR CERVICAL FOUR-FIVE CERVICAL FIVE-SIX- CERVICAL SIX-SEVEN;  Surgeon: Newman Pies, MD;  Location:  [facility];  Service: Neurosurgery;  Laterality: N/A;   CARPAL TUNNEL RELEASE Right 09/26/2012   Procedure: CARPAL TUNNEL RELEASE;  Surgeon: Tennis Must, MD;  Location: [place];  Service: Orthopedics;  Laterality: Right;   CARPAL TUNNEL RELEASE Left 10/24/2012   Procedure: CARPAL TUNNEL RELEASE;  Surgeon: Tennis Must, MD;  Location: [place];   Service: Orthopedics;  Laterality: Left;   EXCISION MELANOMA WITH SENTINEL LYMPH NODE BIOPSY  1984   rt neck with mulpipal nodes and some muscle excised rt neck-shoulder   KNEE ARTHROPLASTY     knee replacement     LAPAROSCOPIC GASTRIC BANDING  03/15/2009   LAPAROSCOPIC GASTRIC BANDING     MUSCLE BIOPSY  2004   rt thigh to r/o myocitis   nerve conduction velosity test     TONSILLECTOMY     TOTAL KNEE ARTHROPLASTY Bilateral 12/05/2010   TRIGGER FINGER RELEASE Right 09/26/2012   Procedure: RELEASE TRIGGER FINGER/A-1 PULLEY RING AND SMALL ;  Surgeon: Tennis Must, MD;  Location: [place];  Service: Orthopedics;  Laterality: Right;   TRIGGER FINGER RELEASE Left 10/24/2012   Procedure: RELEASE TRIGGER FINGER/A-1 PULLEY LEFT MIDDLE AND LEFT RING;  Surgeon: Tennis Must, MD;  Location: [place];  Service: Orthopedics;  Laterality: Left;   Social History   Social History Narrative   Not on file   Immunization History  Administered Date(s) Administered   PFIZER(Purple Top)SARS-COV-2 Vaccination 05/21/2020     Objective: Vital Signs: There were no vitals taken for this visit.   Physical Exam Vitals and nursing note reviewed.  Constitutional:      Appearance: He is well-developed.  HENT:     Head: Normocephalic and atraumatic.  Eyes:     Conjunctiva/sclera: Conjunctivae normal.     Pupils: Pupils are equal, round, and reactive to light.  Pulmonary:     Effort: Pulmonary effort is normal.  Abdominal:     Palpations: Abdomen is soft.  Musculoskeletal:     Cervical back: Normal range of motion and neck supple.  Skin:    General: Skin is warm and dry.     Capillary Refill: Capillary refill takes less than 2 seconds.  Neurological:     Mental Status: He is alert and oriented to person, place, and time.  Psychiatric:        Behavior: Behavior normal.     Musculoskeletal Exam: ***  CDAI Exam: CDAI Score: -- Patient Global: --; Provider Global:  -- Swollen: --; Tender: -- Joint Exam 04/13/2021   No joint exam has been documented for this visit   There is currently no information documented on the homunculus. Go to the Rheumatology activity and complete the homunculus joint exam.  Investigation: No additional findings.  Imaging: No results found.  Recent Labs: Lab Results  Component Value Date   WBC 7.9 11/12/2020   HGB 15.7 11/12/2020   PLT 298 11/12/2020   NA 138 11/12/2020   K 4.7 11/12/2020   CL 102 11/12/2020   CO2 29 11/12/2020   GLUCOSE 91 11/12/2020   BUN 17 11/12/2020   CREATININE 0.98 11/12/2020   BILITOT 0.6 11/12/2020   ALKPHOS 72 09/14/2018   AST 32 11/12/2020   ALT 25 11/12/2020   PROT 7.0 11/12/2020   ALBUMIN 3.8 09/14/2018   CALCIUM 9.8 11/12/2020   GFRAA 91 11/12/2020    Speciality Comments: PLQ Eye Exam: 02/11/2020 WNL @ [facility]  F/u in 1 year.  Procedures:  No procedures performed Allergies: Codeine, Dilaudid [hydromorphone hcl], Fentanyl, Nucynta [tapentadol], and Penicillins   Assessment / Plan:     Visit Diagnoses: No diagnosis found.  Orders: No orders of the defined types were placed in this encounter.  No orders of the defined types were placed in this encounter.   Face-to-face time spent with patient was *** minutes. Greater than 50% of time was spent in counseling and coordination of care.  Follow-Up Instructions: No follow-ups on file.   Earnestine Mealing, CMA  Note - This record has been created using Editor, commissioning.  Chart creation errors have been sought, but may not always  have been located. Such creation errors do not reflect on  the standard of medical care.

## 2021-04-13 ENCOUNTER — Ambulatory Visit: Payer: Medicare Other | Admitting: Physician Assistant

## 2021-04-13 DIAGNOSIS — M7551 Bursitis of right shoulder: Secondary | ICD-10-CM

## 2021-04-13 DIAGNOSIS — M19011 Primary osteoarthritis, right shoulder: Secondary | ICD-10-CM

## 2021-04-13 DIAGNOSIS — Z85820 Personal history of malignant melanoma of skin: Secondary | ICD-10-CM

## 2021-04-13 DIAGNOSIS — M19041 Primary osteoarthritis, right hand: Secondary | ICD-10-CM

## 2021-04-13 DIAGNOSIS — L0100 Impetigo, unspecified: Secondary | ICD-10-CM

## 2021-04-13 DIAGNOSIS — M0609 Rheumatoid arthritis without rheumatoid factor, multiple sites: Secondary | ICD-10-CM

## 2021-04-13 DIAGNOSIS — M5030 Other cervical disc degeneration, unspecified cervical region: Secondary | ICD-10-CM

## 2021-04-13 DIAGNOSIS — Z79899 Other long term (current) drug therapy: Secondary | ICD-10-CM

## 2021-04-13 DIAGNOSIS — Z96653 Presence of artificial knee joint, bilateral: Secondary | ICD-10-CM

## 2021-04-13 DIAGNOSIS — M5136 Other intervertebral disc degeneration, lumbar region: Secondary | ICD-10-CM

## 2021-04-20 ENCOUNTER — Other Ambulatory Visit: Payer: Self-pay | Admitting: Physician Assistant

## 2021-04-20 ENCOUNTER — Other Ambulatory Visit: Payer: Self-pay | Admitting: Rheumatology

## 2021-04-20 DIAGNOSIS — M0609 Rheumatoid arthritis without rheumatoid factor, multiple sites: Secondary | ICD-10-CM

## 2021-04-20 DIAGNOSIS — Z79899 Other long term (current) drug therapy: Secondary | ICD-10-CM

## 2021-04-20 NOTE — Progress Notes (Deleted)
Office Visit Note  Patient: David [name]             Date of Birth: [birth year]           MRN: [id number]             PCP: Orpah Melter, MD Referring: Orpah Melter, MD Visit Date: 04/21/2021 Occupation: @GUAROCC @  Subjective:    History of Present Illness: David [name] is a 70 y.o. male with history of seronegative rheumatoid arthritis, osteoarthritis, and DDD. He is on Methotrexate 1 mL sq injections every 7 days, folic acid 1 mg 2 tablets daily, sulfasalazine 500 mg 2 tablets by mouth twice daily, and Plaquenil 200 mg BID.  Needs Plaquenil eye exam form   CBC and CMP updated on 11/12/20.  He is overdue to update lab work.  Orders for CBC and CMP were released.  His next lab work will be due in March and every 3 months.  Standing orders for CBC and CMP were placed today.  PLQ Eye Exam: 02/11/2020 WNL @ [facility] F/u in 1 year.  Patient was given a LLQ eye exam form to take with him to his next appointment.  Discussed the importance of holding MTX and SSZ if he develops signs or symptoms of an infection and to resume once the infection has completely cleared.    Activities of Daily Living:  Patient reports morning stiffness for *** {minute/hour:19697}.   Patient {ACTIONS;DENIES/REPORTS:21021675::"Denies"} nocturnal pain.  Difficulty dressing/grooming: {ACTIONS;DENIES/REPORTS:21021675::"Denies"} Difficulty climbing stairs: {ACTIONS;DENIES/REPORTS:21021675::"Denies"} Difficulty getting out of chair: {ACTIONS;DENIES/REPORTS:21021675::"Denies"} Difficulty using hands for taps, buttons, cutlery, and/or writing: {ACTIONS;DENIES/REPORTS:21021675::"Denies"}  No Rheumatology ROS completed.   PMFS History:  Patient Active Problem List   Diagnosis Date Noted   Cervical spondylosis with radiculopathy 02/26/2020   SBO (small bowel obstruction) ([place]) 08/21/2017   Trochanteric bursitis of left hip 01/18/2017   Lateral epicondylitis, right elbow 01/18/2017   Rheumatoid arthritis ([place])  03/15/2016   DJD (degenerative joint disease), cervical 03/15/2016   DDD (degenerative disc disease), lumbar 03/15/2016   HTN (hypertension) 03/15/2016   Obesity 03/15/2016   Elevated cholesterol 03/15/2016   Neuralgia 03/15/2016   Malignant melanoma of right side of neck ([place]) 03/15/2016   BBB (bundle branch block) 03/15/2016   High risk medication use 03/15/2016   Osteoarthritis of lumbar spine 03/15/2016   Primary osteoarthritis of both hands 03/15/2016   Primary osteoarthritis of both knees 03/15/2016   Mesenteric adenitis 06/29/2015    Past Medical History:  Diagnosis Date   Arthritis    BBB (bundle branch block) 03/15/2016   Carpal tunnel syndrome    DDD (degenerative disc disease), lumbar 03/15/2016   Depression    Dysrhythmia    had some tachycardia with steroids   Elevated cholesterol 03/15/2016   Headache(784.0)    Ocular Migraines - takes Ambien for it   Heart murmur    HTN (hypertension) 03/15/2016   Hyperlipemia    Hypertension    Malignant melanoma of right side of neck ([place]) 03/15/2016   30 years ago    Neuralgia 03/15/2016   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    RLS (restless legs syndrome)    Sleep apnea    could not use a cpap   Snores    Status post gastric banding    Wears glasses    driving    Family History  Problem Relation Age of Onset  Heart disease Mother    Alzheimer's disease Father    Past Surgical History:  Procedure Laterality Date   ANTERIOR CERVICAL DECOMPRESSION/DISCECTOMY FUSION 4 LEVELS N/A 02/26/2020   Procedure: ANTERIOR CERVICAL DECOMPRESSION/DISCECTOMY FUSION, INTERBODY PROSTHESIS, PLATE/SCREWS CERVICAL THREE-FOUR CERVICAL FOUR-FIVE CERVICAL FIVE-SIX- CERVICAL SIX-SEVEN;  Surgeon: Newman Pies, MD;  Location: [facility];  Service: Neurosurgery;  Laterality: N/A;   CARPAL TUNNEL RELEASE Right 09/26/2012   Procedure: CARPAL TUNNEL RELEASE;  Surgeon: Tennis Must, MD;  Location: [place];  Service: Orthopedics;  Laterality: Right;   CARPAL TUNNEL RELEASE Left 10/24/2012   Procedure: CARPAL TUNNEL RELEASE;  Surgeon: Tennis Must, MD;  Location: [place];  Service: Orthopedics;  Laterality: Left;   EXCISION MELANOMA WITH SENTINEL LYMPH NODE BIOPSY  1984   rt neck with mulpipal nodes and some muscle excised rt neck-shoulder   KNEE ARTHROPLASTY     knee replacement     LAPAROSCOPIC GASTRIC BANDING  03/15/2009   LAPAROSCOPIC GASTRIC BANDING     MUSCLE BIOPSY  2004   rt thigh to r/o myocitis   nerve conduction velosity test     TONSILLECTOMY     TOTAL KNEE ARTHROPLASTY Bilateral 12/05/2010   TRIGGER FINGER RELEASE Right 09/26/2012   Procedure: RELEASE TRIGGER FINGER/A-1 PULLEY RING AND SMALL ;  Surgeon: Tennis Must, MD;  Location: [place];  Service: Orthopedics;  Laterality: Right;   TRIGGER FINGER RELEASE Left 10/24/2012   Procedure: RELEASE TRIGGER FINGER/A-1 PULLEY LEFT MIDDLE AND LEFT RING;  Surgeon: Tennis Must, MD;  Location: [place];  Service: Orthopedics;  Laterality: Left;   Social History   Social History Narrative   Not on file   Immunization History  Administered Date(s) Administered   PFIZER(Purple Top)SARS-COV-2 Vaccination 05/21/2020     Objective: Vital Signs: There were no vitals taken for this visit.   Physical Exam Vitals and nursing note reviewed.  Constitutional:      Appearance: He is well-developed.  HENT:     Head: Normocephalic and atraumatic.  Eyes:     Conjunctiva/sclera: Conjunctivae normal.     Pupils: Pupils are equal, round, and reactive to light.  Pulmonary:     Effort: Pulmonary effort is normal.  Abdominal:     Palpations: Abdomen is soft.  Musculoskeletal:     Cervical back: Normal range of motion and neck supple.  Skin:    General: Skin is warm and dry.     Capillary Refill: Capillary refill takes less than 2 seconds.   Neurological:     Mental Status: He is alert and oriented to person, place, and time.  Psychiatric:        Behavior: Behavior normal.     Musculoskeletal Exam: ***  CDAI Exam: CDAI Score: -- Patient Global: --; Provider Global: -- Swollen: --; Tender: -- Joint Exam 04/21/2021   No joint exam has been documented for this visit   There is currently no information documented on the homunculus. Go to the Rheumatology activity and complete the homunculus joint exam.  Investigation: No additional findings.  Imaging: No results found.  Recent Labs: Lab Results  Component Value Date   WBC 7.9 11/12/2020   HGB 15.7 11/12/2020   PLT 298 11/12/2020   NA 138 11/12/2020   K 4.7 11/12/2020   CL 102 11/12/2020   CO2 29 11/12/2020   GLUCOSE 91 11/12/2020   BUN 17 11/12/2020   CREATININE 0.98 11/12/2020   BILITOT 0.6 11/12/2020  ALKPHOS 72 09/14/2018   AST 32 11/12/2020   ALT 25 11/12/2020   PROT 7.0 11/12/2020   ALBUMIN 3.8 09/14/2018   CALCIUM 9.8 11/12/2020   GFRAA 91 11/12/2020    Speciality Comments: PLQ Eye Exam: 02/11/2020 WNL @ [facility]  F/u in 1 year.   Procedures:  No procedures performed Allergies: Codeine, Dilaudid [hydromorphone hcl], Fentanyl, Nucynta [tapentadol], and Penicillins   Assessment / Plan:     Visit Diagnoses: Rheumatoid arthritis of multiple sites with negative rheumatoid factor (HCC)  High risk medication use  Primary osteoarthritis of both hands  Arthritis of both acromioclavicular joints  Bursitis of right shoulder  Hx of total knee replacement, bilateral  DDD (degenerative disc disease), cervical  DDD (degenerative disc disease), lumbar  History of melanoma  Neuralgia  Impetigo  Muscle spasm  History of obesity  Orders: No orders of the defined types were placed in this encounter.  No orders of the defined types were placed in this encounter.   Follow-Up Instructions: No follow-ups on file.   Ofilia Neas,  PA-C  Note - This record has been created using Dragon software.  Chart creation errors have been sought, but may not always  have been located. Such creation errors do not reflect on  the standard of medical care.

## 2021-04-20 NOTE — Telephone Encounter (Signed)
Next Visit: 04/21/2021   Last Visit: 11/12/2020   Labs: 11/12/2020, CBC,and CMP are stable. ESR is normal. Labs do not indicate any inflammation. No change in tt advised.   Eye exam: 02/11/2020 WNL    Current Dose per office note 11/12/2020: Plaquenil 200 mg BID    HT:[id number] arthritis of multiple sites with negative rheumatoid factor    Last Fill: 01/20/2021  Patient aware he is due to update PLQ eye exam. Patient will call to schedule and advise tomorrow when appointment is.    Okay to refill Plaquenil?

## 2021-04-20 NOTE — Telephone Encounter (Signed)
Next Visit: Due November 2022. Message sent to the front to schedule patient.    Last Visit: 11/12/2020  Current Dose per office note 3/66/4403: folic acid 1 mg 2 tablets daily   [id number] arthritis of multiple sites with negative rheumatoid factor    Last Fill: 04/25/2020  Okay to refill Folic Acid?

## 2021-04-21 ENCOUNTER — Ambulatory Visit: Payer: Medicare Other | Admitting: Physician Assistant

## 2021-04-21 DIAGNOSIS — M5136 Other intervertebral disc degeneration, lumbar region: Secondary | ICD-10-CM

## 2021-04-21 DIAGNOSIS — M792 Neuralgia and neuritis, unspecified: Secondary | ICD-10-CM

## 2021-04-21 DIAGNOSIS — M19012 Primary osteoarthritis, left shoulder: Secondary | ICD-10-CM

## 2021-04-21 DIAGNOSIS — Z96653 Presence of artificial knee joint, bilateral: Secondary | ICD-10-CM

## 2021-04-21 DIAGNOSIS — Z8639 Personal history of other endocrine, nutritional and metabolic disease: Secondary | ICD-10-CM

## 2021-04-21 DIAGNOSIS — M7551 Bursitis of right shoulder: Secondary | ICD-10-CM

## 2021-04-21 DIAGNOSIS — M19041 Primary osteoarthritis, right hand: Secondary | ICD-10-CM

## 2021-04-21 DIAGNOSIS — L0100 Impetigo, unspecified: Secondary | ICD-10-CM

## 2021-04-21 DIAGNOSIS — M0609 Rheumatoid arthritis without rheumatoid factor, multiple sites: Secondary | ICD-10-CM

## 2021-04-21 DIAGNOSIS — M5030 Other cervical disc degeneration, unspecified cervical region: Secondary | ICD-10-CM

## 2021-04-21 DIAGNOSIS — Z79899 Other long term (current) drug therapy: Secondary | ICD-10-CM

## 2021-04-21 DIAGNOSIS — M62838 Other muscle spasm: Secondary | ICD-10-CM

## 2021-04-21 DIAGNOSIS — Z85820 Personal history of malignant melanoma of skin: Secondary | ICD-10-CM

## 2021-04-21 NOTE — Progress Notes (Signed)
Office Visit Note  Patient: David [name]             Date of Birth: 03/29/1951           MRN: [id number]             PCP: Orpah Melter, MD Referring: Orpah Melter, MD Visit Date: 04/22/2021 Occupation: @GUAROCC @  Subjective:  Medication monitoring    History of Present Illness: DOREN KASPAR is a 70 y.o. male with history of seronegative rheumatoid arthritis, osteoarthritis, and DDD.  She is taking plaquenil 200 mg 1 tablet by mouth twice daily, sulfasalazine 500 mg 2 tablets by mouth twice daily, and methotrexate 1.0 ml sq injections once weekly.  He has not missed any doses of these medications recently.  He denies any recent rheumatoid arthritis flares.  Patient reports that if he performs yard work he will experience increased pain in his hands at night.  He has been taking a combination pill of acetaminophen, aspirin, and caffeine for pain relief.  He states that he has occasional discomfort in his neck after having a fusion in October 2021.  He denies any symptoms of radiculopathy.  He states both knee replacements are doing well. He denies any recent infections.    Activities of Daily Living:  Patient reports morning stiffness for 3 hours.   Patient Denies nocturnal pain.  Difficulty dressing/grooming: Denies Difficulty climbing stairs: Denies Difficulty getting out of chair: Denies Difficulty using hands for taps, buttons, cutlery, and/or writing: Denies  Review of Systems  Constitutional:  Positive for fatigue.  HENT:  Positive for mouth dryness. Negative for mouth sores and nose dryness.   Eyes:  Negative for pain, itching and dryness.  Respiratory:  Negative for shortness of breath and difficulty breathing.   Cardiovascular:  Negative for chest pain and palpitations.  Gastrointestinal:  Negative for blood in stool, constipation and diarrhea.  Endocrine: Negative for increased urination.  Genitourinary:  Negative for difficulty urinating.  Musculoskeletal:   Positive for joint pain, joint pain, joint swelling, myalgias, morning stiffness, muscle tenderness and myalgias.  Skin:  Negative for color change, rash and redness.  Allergic/Immunologic: Negative for susceptible to infections.  Neurological:  Positive for numbness and headaches. Negative for dizziness and memory loss.  Hematological:  Negative for bruising/bleeding tendency.  Psychiatric/Behavioral:  Negative for confusion.    PMFS History:  Patient Active Problem List   Diagnosis Date Noted   Cervical spondylosis with radiculopathy 02/26/2020   SBO (small bowel obstruction) ([place]) 08/21/2017   Trochanteric bursitis of left hip 01/18/2017   Lateral epicondylitis, right elbow 01/18/2017   Rheumatoid arthritis ([place]) 03/15/2016   DJD (degenerative joint disease), cervical 03/15/2016   DDD (degenerative disc disease), lumbar 03/15/2016   HTN (hypertension) 03/15/2016   Obesity 03/15/2016   Elevated cholesterol 03/15/2016   Neuralgia 03/15/2016   Malignant melanoma of right side of neck ([place]) 03/15/2016   BBB (bundle branch block) 03/15/2016   High risk medication use 03/15/2016   Osteoarthritis of lumbar spine 03/15/2016   Primary osteoarthritis of both hands 03/15/2016   Primary osteoarthritis of both knees 03/15/2016   Mesenteric adenitis 06/29/2015    Past Medical History:  Diagnosis Date   Arthritis    BBB (bundle branch block) 03/15/2016   Carpal tunnel syndrome    DDD (degenerative disc disease), lumbar 03/15/2016   Depression    Dysrhythmia    had some tachycardia with steroids   Elevated cholesterol 03/15/2016   Headache(784.0)    Ocular  Migraines - takes Ambien for it   Heart murmur    HTN (hypertension) 03/15/2016   Hyperlipemia    Hypertension    Malignant melanoma of right side of neck ([place]) 03/15/2016   30 years ago    Neuralgia 03/15/2016   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    RLS  (restless legs syndrome)    Sleep apnea    could not use a cpap   Snores    Status post gastric banding    Wears glasses    driving    Family History  Problem Relation Age of Onset   Heart disease Mother    Alzheimer's disease Father    Past Surgical History:  Procedure Laterality Date   ANTERIOR CERVICAL DECOMPRESSION/DISCECTOMY FUSION 4 LEVELS N/A 02/26/2020   Procedure: ANTERIOR CERVICAL DECOMPRESSION/DISCECTOMY FUSION, INTERBODY PROSTHESIS, PLATE/SCREWS CERVICAL THREE-FOUR CERVICAL FOUR-FIVE CERVICAL FIVE-SIX- CERVICAL SIX-SEVEN;  Surgeon: Newman Pies, MD;  Location: [place];  Service: Neurosurgery;  Laterality: N/A;   CARPAL TUNNEL RELEASE Right 09/26/2012   Procedure: CARPAL TUNNEL RELEASE;  Surgeon: Tennis Must, MD;  Location: [facility];  Service: Orthopedics;  Laterality: Right;   CARPAL TUNNEL RELEASE Left 10/24/2012   Procedure: CARPAL TUNNEL RELEASE;  Surgeon: Tennis Must, MD;  Location: [place];  Service: Orthopedics;  Laterality: Left;   EXCISION MELANOMA WITH SENTINEL LYMPH NODE BIOPSY  1984   rt neck with mulpipal nodes and some muscle excised rt neck-shoulder   KNEE ARTHROPLASTY     knee replacement     LAPAROSCOPIC GASTRIC BANDING  03/15/2009   LAPAROSCOPIC GASTRIC BANDING     MUSCLE BIOPSY  2004   rt thigh to r/o myocitis   nerve conduction velosity test     TONSILLECTOMY     TOTAL KNEE ARTHROPLASTY Bilateral 12/05/2010   TRIGGER FINGER RELEASE Right 09/26/2012   Procedure: RELEASE TRIGGER FINGER/A-1 PULLEY RING AND SMALL ;  Surgeon: Tennis Must, MD;  Location: [facility];  Service: Orthopedics;  Laterality: Right;   TRIGGER FINGER RELEASE Left 10/24/2012   Procedure: RELEASE TRIGGER FINGER/A-1 PULLEY LEFT MIDDLE AND LEFT RING;  Surgeon: Tennis Must, MD;  Location: [facility];  Service: Orthopedics;  Laterality: Left;   Social History   Social History Narrative   Not on file   Immunization  History  Administered Date(s) Administered   PFIZER(Purple Top)SARS-COV-2 Vaccination 05/21/2020     Objective: Vital Signs: BP (!) 148/93 (BP Location: Left Arm, Patient Position: Sitting, Cuff Size: Large)   Pulse 75   Ht 5\' 8"  (1.727 m)   Wt 260 lb (117.9 kg)   BMI 39.53 kg/m    Physical Exam Vitals and nursing note reviewed.  Constitutional:      Appearance: He is well-developed.  HENT:     Head: Normocephalic and atraumatic.  Eyes:     Conjunctiva/sclera: Conjunctivae normal.     Pupils: Pupils are equal, round, and reactive to light.  Pulmonary:     Effort: Pulmonary effort is normal.  Abdominal:     Palpations: Abdomen is soft.  Musculoskeletal:     Cervical back: Normal range of motion and neck supple.  Skin:    General: Skin is warm and dry.     Capillary Refill: Capillary refill takes less than 2 seconds.  Neurological:     Mental Status: He is alert and oriented to person,  place, and time.  Psychiatric:        Behavior: Behavior normal.     Musculoskeletal Exam: C-spine has limited ROM with lateral rotation to the right. Postural thoracic kyphosis.  No midline spinal tenderness.  Right shoulder has slightly limited ROM.  Left shoulder has good ROM with no discomfort.  Elbow joints, wrist joints, MCPs, PIPs, and DIPs good ROM with no synovitis.  Complete fist formation bilaterally.  PIP and DIP thickening consistent with osteoarthritis of both hands.  Hip joints, knee joints, and ankle joints have good ROM with no discomfort.  No warmth or effusion of knee joints.  No tenderness or swelling of ankle joints.  Pedal edema noted.   CDAI Exam: CDAI Score: 0.4  Patient Global: 2 mm; Provider Global: 2 mm Swollen: 0 ; Tender: 0  Joint Exam 04/22/2021   No joint exam has been documented for this visit   There is currently no information documented on the homunculus. Go to the Rheumatology activity and complete the homunculus joint exam.  Investigation: No  additional findings.  Imaging: No results found.  Recent Labs: Lab Results  Component Value Date   WBC 7.9 11/12/2020   HGB 15.7 11/12/2020   PLT 298 11/12/2020   NA 138 11/12/2020   K 4.7 11/12/2020   CL 102 11/12/2020   CO2 29 11/12/2020   GLUCOSE 91 11/12/2020   BUN 17 11/12/2020   CREATININE 0.98 11/12/2020   BILITOT 0.6 11/12/2020   ALKPHOS 72 09/14/2018   AST 32 11/12/2020   ALT 25 11/12/2020   PROT 7.0 11/12/2020   ALBUMIN 3.8 09/14/2018   CALCIUM 9.8 11/12/2020   GFRAA 91 11/12/2020    Speciality Comments: PLQ Eye Exam: 02/11/2020 WNL @ [facility]  F/u in 1 year.   Procedures:  No procedures performed Allergies: Codeine, Dilaudid [hydromorphone hcl], Fentanyl, Nucynta [tapentadol], and Penicillins   Assessment / Plan:     Visit Diagnoses: Rheumatoid arthritis of multiple sites with negative rheumatoid factor [facility]): He has no joint tenderness or synovitis on examination today.  He has not had any signs or symptoms of a rheumatoid arthritis flare recently.  He has clinically been doing well on triple therapy: Methotrexate 1 mL subcutaneous injections once weekly, Plaquenil 200 mg 1 tablet by mouth twice daily, sulfasalazine 500 mg 2 tablets by mouth twice daily.  He has been tolerating these medications without any side effects and has not missed any doses recently.  He occasionally experiences increased pain and stiffness in both hands in the evenings after performing yard work.  He takes over-the-counter products for pain relief as needed.  Overall his symptoms have been tolerable on the current treatment regimen.  No medication changes will be made at this time.  He does not need any refills at this time.  He was advised to notify us if he develops signs or symptoms of a flare.  He will follow-up in the office in 5 months.  High risk medication use -He is on Methotrexate 1.0 ml sq injections once weekly, folic acid 2 mg daily, plaquenil 200 mg 1 tablet by mouth twice  daily, and sulfasalazine 500 mg 2 tablets by mouth twice daily.  CBC and CMP updated on 11/12/20.  He is overdue to update lab work. Orders released. His next lab work will be due in March and every 3 months.  Standing orders for CBC and CMP are in place.  He has a Plaquenil eye exam scheduled on 06/10/2021.Plan: CBC with  Differential/Platelet, COMPLETE METABOLIC PANEL WITH GFR He has not had any recent infections. Discussed the importance of holding SSZ and MTX if he develops signs or symptoms of an infection and to resume once the infection has completely cleared.    Primary osteoarthritis of both hands: He has PIP and DIP thickening consistent with osteoarthritis of both hands.  CMC joint thickening noted bilaterally.  Complete fist formation bilaterally.  Discussed the importance of joint protection and muscle strengthening.  Arthritis of both acromioclavicular joints: He has painful and slightly limited range of motion of the right shoulder joint on examination.  He had a right subacromial cortisone injection performed on 11/12/2020 which has provided significant pain relief.  He is not having any discomfort in his left shoulder at this time.  He has good range of motion of the left shoulder joint with no discomfort.  Bursitis of right shoulder: He had a right subacromial bursa cortisone injection performed on 11/12/2020 by Dr. Dimas Alexandria.  His discomfort is improved significantly.  He still has some painful limited range of motion on examination but overall his symptoms have been tolerable.  Hx of total knee replacement, bilateral - Dr. Jose Persia well.  He has good range of motion of both knee replacements with no discomfort.  DDD (degenerative disc disease), cervical - S/p fusion and discectomy October 2021 with Dr. Arnoldo Morale.  He has limited range of motion with lateral rotation especially to the right.  He has occasional discomfort when looking down to the right but otherwise his symptoms have been  manageable.  He is not experiencing any symptoms of radiculopathy at this time.  DDD (degenerative disc disease), lumbar: He is not experiencing any increased lower back pain at this time.  He has no midline spinal tenderness or SI joint tenderness at this time.  No symptoms of radiculopathy.  Other medical conditions are listed as follows:   History of melanoma  Impetigo:Resolved   Neuralgia: Resolved   Muscle spasm: Resolved   History of obesity  Orders: Orders Placed This Encounter  Procedures   CBC with Differential/Platelet   COMPLETE METABOLIC PANEL WITH GFR   No orders of the defined types were placed in this encounter.   Follow-Up Instructions: Return in about 5 months (around 09/20/2021) for Rheumatoid arthritis, Osteoarthritis, DDD.   Ofilia Neas, PA-C  Note - This record has been created using Dragon software.  Chart creation errors have been sought, but may not always  have been located. Such creation errors do not reflect on  the standard of medical care.

## 2021-04-22 ENCOUNTER — Encounter: Payer: Self-pay | Admitting: Physician Assistant

## 2021-04-22 ENCOUNTER — Ambulatory Visit (INDEPENDENT_AMBULATORY_CARE_PROVIDER_SITE_OTHER): Payer: Medicare Other | Admitting: Physician Assistant

## 2021-04-22 ENCOUNTER — Other Ambulatory Visit: Payer: Self-pay

## 2021-04-22 VITALS — BP 148/93 | HR 75 | Ht 68.0 in | Wt 260.0 lb

## 2021-04-22 DIAGNOSIS — M62838 Other muscle spasm: Secondary | ICD-10-CM | POA: Diagnosis not present

## 2021-04-22 DIAGNOSIS — M5136 Other intervertebral disc degeneration, lumbar region: Secondary | ICD-10-CM

## 2021-04-22 DIAGNOSIS — M19041 Primary osteoarthritis, right hand: Secondary | ICD-10-CM | POA: Diagnosis not present

## 2021-04-22 DIAGNOSIS — M19011 Primary osteoarthritis, right shoulder: Secondary | ICD-10-CM

## 2021-04-22 DIAGNOSIS — M0609 Rheumatoid arthritis without rheumatoid factor, multiple sites: Secondary | ICD-10-CM | POA: Diagnosis not present

## 2021-04-22 DIAGNOSIS — M792 Neuralgia and neuritis, unspecified: Secondary | ICD-10-CM

## 2021-04-22 DIAGNOSIS — M5030 Other cervical disc degeneration, unspecified cervical region: Secondary | ICD-10-CM | POA: Diagnosis not present

## 2021-04-22 DIAGNOSIS — Z85820 Personal history of malignant melanoma of skin: Secondary | ICD-10-CM | POA: Diagnosis not present

## 2021-04-22 DIAGNOSIS — L0100 Impetigo, unspecified: Secondary | ICD-10-CM

## 2021-04-22 DIAGNOSIS — Z79899 Other long term (current) drug therapy: Secondary | ICD-10-CM

## 2021-04-22 DIAGNOSIS — Z8639 Personal history of other endocrine, nutritional and metabolic disease: Secondary | ICD-10-CM

## 2021-04-22 DIAGNOSIS — M19042 Primary osteoarthritis, left hand: Secondary | ICD-10-CM

## 2021-04-22 DIAGNOSIS — Z96653 Presence of artificial knee joint, bilateral: Secondary | ICD-10-CM

## 2021-04-22 DIAGNOSIS — M19012 Primary osteoarthritis, left shoulder: Secondary | ICD-10-CM

## 2021-04-22 DIAGNOSIS — M7551 Bursitis of right shoulder: Secondary | ICD-10-CM

## 2021-04-22 NOTE — Patient Instructions (Signed)
Standing Labs °We placed an order today for your standing lab work.  ° °Please have your standing labs drawn in March and every 3 months  ° ° °If possible, please have your labs drawn 2 weeks prior to your appointment so that the provider can discuss your results at your appointment. ° °Please note that you may see your imaging and lab results in MyChart before we have reviewed them. °We may be awaiting multiple results to interpret others before contacting you. °Please allow our office up to 72 hours to thoroughly review all of the results before contacting the office for clarification of your results. ° °We have open lab daily: °Monday through Thursday from 1:30-4:30 PM and Friday from 1:30-4:00 PM °at the office of Dr. Shaili Deveshwar, [facility].   °Please be advised, all patients with office appointments requiring lab work will take precedent over walk-in lab work.  °If possible, please come for your lab work on Monday and Friday afternoons, as you may experience shorter wait times. °The office is located at [address], [address], [place], [place] [address] °No appointment is necessary.   °Labs are drawn by [facility]. Please bring your co-pay at the time of your lab draw.  You may receive a bill from [facility] for your lab work. ° °If you wish to have your labs drawn at another location, please call the office 24 hours in advance to send orders. ° °If you have any questions regarding directions or hours of operation,  °please call [phone].   °As a reminder, please drink plenty of water prior to coming for your lab work. Thanks! ° °

## 2021-04-23 LAB — CBC WITH DIFFERENTIAL/PLATELET
Absolute Monocytes: 816 cells/uL (ref 200–950)
Basophils Absolute: 77 cells/uL (ref 0–200)
Basophils Relative: 1 %
Eosinophils Absolute: 208 cells/uL (ref 15–500)
Eosinophils Relative: 2.7 %
HCT: 48.3 % (ref 38.5–50.0)
Hemoglobin: 15.8 g/dL (ref 13.2–17.1)
Lymphs Abs: 2195 cells/uL (ref 850–3900)
MCH: 31.5 pg (ref 27.0–33.0)
MCHC: 32.7 g/dL (ref 32.0–36.0)
MCV: 96.2 fL (ref 80.0–100.0)
MPV: 9.7 fL (ref 7.5–12.5)
Monocytes Relative: 10.6 %
Neutro Abs: 4404 cells/uL (ref 1500–7800)
Neutrophils Relative %: 57.2 %
Platelets: 260 10*3/uL (ref 140–400)
RBC: 5.02 10*6/uL (ref 4.20–5.80)
RDW: 13.6 % (ref 11.0–15.0)
Total Lymphocyte: 28.5 %
WBC: 7.7 10*3/uL (ref 3.8–10.8)

## 2021-04-23 LAB — COMPLETE METABOLIC PANEL WITH GFR
AG Ratio: 1.6 (calc) (ref 1.0–2.5)
ALT: 27 U/L (ref 9–46)
AST: 28 U/L (ref 10–35)
Albumin: 4.2 g/dL (ref 3.6–5.1)
Alkaline phosphatase (APISO): 79 U/L (ref 35–144)
BUN: 21 mg/dL (ref 7–25)
CO2: 26 mmol/L (ref 20–32)
Calcium: 10 mg/dL (ref 8.6–10.3)
Chloride: 104 mmol/L (ref 98–110)
Creat: 1.06 mg/dL (ref 0.70–1.35)
Globulin: 2.6 g/dL (calc) (ref 1.9–3.7)
Glucose, Bld: 100 mg/dL — ABNORMAL HIGH (ref 65–99)
Potassium: 5.2 mmol/L (ref 3.5–5.3)
Sodium: 138 mmol/L (ref 135–146)
Total Bilirubin: 0.5 mg/dL (ref 0.2–1.2)
Total Protein: 6.8 g/dL (ref 6.1–8.1)
eGFR: 76 mL/min/{1.73_m2} (ref >=60)

## 2021-04-25 NOTE — Progress Notes (Signed)
CBC and CMP WNL

## 2021-04-26 ENCOUNTER — Other Ambulatory Visit: Payer: Self-pay | Admitting: Physician Assistant

## 2021-04-26 NOTE — Telephone Encounter (Signed)
Next Visit: 09/23/2021  Last Visit: 04/22/2021  Last Fill: 12/13/2020  DX: Rheumatoid arthritis of multiple sites with negative rheumatoid factor   Current Dose per office note 04/22/2021: sulfasalazine 500 mg 2 tablets by mouth twice daily  Labs: 04/22/2021 CBC and CMP WNL  Okay to refill SSZ?

## 2021-05-04 DIAGNOSIS — I10 Essential (primary) hypertension: Secondary | ICD-10-CM | POA: Diagnosis not present

## 2021-05-04 DIAGNOSIS — Z0000 Encounter for general adult medical examination without abnormal findings: Secondary | ICD-10-CM | POA: Diagnosis not present

## 2021-05-04 DIAGNOSIS — K2270 Barrett's esophagus without dysplasia: Secondary | ICD-10-CM | POA: Diagnosis not present

## 2021-05-04 DIAGNOSIS — M069 Rheumatoid arthritis, unspecified: Secondary | ICD-10-CM | POA: Diagnosis not present

## 2021-05-04 DIAGNOSIS — N400 Enlarged prostate without lower urinary tract symptoms: Secondary | ICD-10-CM | POA: Diagnosis not present

## 2021-05-27 DIAGNOSIS — Z86010 Personal history of colonic polyps: Secondary | ICD-10-CM | POA: Diagnosis not present

## 2021-05-27 DIAGNOSIS — K219 Gastro-esophageal reflux disease without esophagitis: Secondary | ICD-10-CM | POA: Diagnosis not present

## 2021-05-27 DIAGNOSIS — K22719 Barrett's esophagus with dysplasia, unspecified: Secondary | ICD-10-CM | POA: Diagnosis not present

## 2021-07-07 ENCOUNTER — Other Ambulatory Visit: Payer: Self-pay | Admitting: Rheumatology

## 2021-07-08 NOTE — Telephone Encounter (Signed)
Next Visit: 09/23/2021   Last Visit: 04/22/2021   Last Fill: 01/03/2021   DX: Rheumatoid arthritis of multiple sites with negative rheumatoid factor    Current Dose per office note 04/22/2021: Methotrexate 1.0 ml sq injections once weekly  Labs: 04/22/2021 CBC and CMP WNL   Okay to refill MTX?

## 2021-07-19 DIAGNOSIS — Z86010 Personal history of colonic polyps: Secondary | ICD-10-CM | POA: Diagnosis not present

## 2021-07-19 DIAGNOSIS — D120 Benign neoplasm of cecum: Secondary | ICD-10-CM | POA: Diagnosis not present

## 2021-07-19 DIAGNOSIS — K22719 Barrett's esophagus with dysplasia, unspecified: Secondary | ICD-10-CM | POA: Diagnosis not present

## 2021-07-19 DIAGNOSIS — K2270 Barrett's esophagus without dysplasia: Secondary | ICD-10-CM | POA: Diagnosis not present

## 2021-07-19 DIAGNOSIS — K5730 Diverticulosis of large intestine without perforation or abscess without bleeding: Secondary | ICD-10-CM | POA: Diagnosis not present

## 2021-07-19 DIAGNOSIS — K219 Gastro-esophageal reflux disease without esophagitis: Secondary | ICD-10-CM | POA: Diagnosis not present

## 2021-07-19 DIAGNOSIS — Z1211 Encounter for screening for malignant neoplasm of colon: Secondary | ICD-10-CM | POA: Diagnosis not present

## 2021-07-19 DIAGNOSIS — K2980 Duodenitis without bleeding: Secondary | ICD-10-CM | POA: Diagnosis not present

## 2021-08-08 ENCOUNTER — Other Ambulatory Visit: Payer: Self-pay | Admitting: Physician Assistant

## 2021-08-08 DIAGNOSIS — Z85820 Personal history of malignant melanoma of skin: Secondary | ICD-10-CM | POA: Diagnosis not present

## 2021-08-08 DIAGNOSIS — Z79899 Other long term (current) drug therapy: Secondary | ICD-10-CM

## 2021-08-08 DIAGNOSIS — Z85828 Personal history of other malignant neoplasm of skin: Secondary | ICD-10-CM | POA: Diagnosis not present

## 2021-08-08 DIAGNOSIS — L821 Other seborrheic keratosis: Secondary | ICD-10-CM | POA: Diagnosis not present

## 2021-08-08 DIAGNOSIS — L570 Actinic keratosis: Secondary | ICD-10-CM | POA: Diagnosis not present

## 2021-08-08 NOTE — Telephone Encounter (Signed)
Next Visit: 09/23/2021 ?  ?Last Visit: 04/22/2021 ?  ?Last Fill: 04/26/2021 ? ?DX: Rheumatoid arthritis of multiple sites with negative rheumatoid factor  ?  ?Current Dose per office note 04/22/2021: sulfasalazine 500 mg 2 tablets by mouth twice daily ?  ?Labs: 04/22/2021 CBC and CMP WNL ? ?Patient's wife advised he is due to update labs. Patient will try to update this week.  ?  ?Okay to refill SSZ?  ?

## 2021-08-11 ENCOUNTER — Other Ambulatory Visit: Payer: Self-pay | Admitting: *Deleted

## 2021-08-11 DIAGNOSIS — Z79899 Other long term (current) drug therapy: Secondary | ICD-10-CM

## 2021-08-25 ENCOUNTER — Telehealth: Payer: Self-pay

## 2021-08-25 NOTE — Telephone Encounter (Signed)
I called pt to work him in for wed. and he stated he will just have his RA do the injection on Monday  ?

## 2021-08-25 NOTE — Progress Notes (Signed)
? ?Office Visit Note ? ?Patient: David [name]             ?Date of Birth: August 23, 1950           ?MRN: [id number]             ?PCP: Orpah Melter, MD ?Referring: Orpah Melter, MD ?Visit Date: 08/29/2021 ?Occupation: '@GUAROCC'$ @ ? ?Subjective:  ?Right shoulder pain  ? ?History of Present Illness: David [name] is a 71 y.o. male with history of rheumatoid arthritis and osteoarthritis.  Patient is taking Methotrexate 1.0 ml sq injections once weekly, folic acid 2 mg daily, plaquenil 200 mg 1 tablet by mouth twice daily, and sulfasalazine 500 mg 2 tablets po BID. he continues to tolerate these medications without any side effects and has not missed any doses recently.  He presents today with increased shoulder pain which has progressively been worsening over the past 1 month.  He has not had any recent injury or fall.  He describes the pain as a throbbing sensation and has had increased discomfort at night.  He states he is hoping to have surgery performed by Dr. Ninfa Linden in July/August 2023.  He has an upcoming vacation in June and would like relief prior to his surgery.  He requested a right shoulder joint cortisone injection today. ?He states he has been experiencing increased pain and stiffness in both wrist joints.  He denies any joint swelling at this time.  He is also having some discomfort due to left trochanteric bursitis. ?He denies any recent infections.  ? ? ?Activities of Daily Living:  ?Patient reports morning stiffness for 2-3 hours.   ?Patient Denies nocturnal pain.  ?Difficulty dressing/grooming: Denies ?Difficulty climbing stairs: Denies ?Difficulty getting out of chair: Denies ?Difficulty using hands for taps, buttons, cutlery, and/or writing: Denies ? ?Review of Systems  ?Constitutional:  Positive for fatigue.  ?HENT:  Positive for mouth dryness. Negative for mouth sores and nose dryness.   ?Eyes:  Negative for pain, itching and dryness.  ?Respiratory:  Negative for shortness of breath and  difficulty breathing.   ?Cardiovascular:  Negative for chest pain and palpitations.  ?Gastrointestinal:  Negative for blood in stool, constipation and diarrhea.  ?Endocrine: Negative for increased urination.  ?Genitourinary:  Negative for difficulty urinating.  ?Musculoskeletal:  Positive for joint pain, joint pain, joint swelling, myalgias, morning stiffness, muscle tenderness and myalgias.  ?Skin:  Negative for color change, rash and redness.  ?Allergic/Immunologic: Negative for susceptible to infections.  ?Neurological:  Negative for dizziness, numbness, headaches, memory loss and weakness.  ?Hematological:  Negative for bruising/bleeding tendency.  ?Psychiatric/Behavioral:  Negative for confusion.   ? ?PMFS History:  ?Patient Active Problem List  ? Diagnosis Date Noted  ? Cervical spondylosis with radiculopathy 02/26/2020  ? SBO (small bowel obstruction) ([place]) 08/21/2017  ? Trochanteric bursitis of left hip 01/18/2017  ? Lateral epicondylitis, right elbow 01/18/2017  ? Rheumatoid arthritis ([place]) 03/15/2016  ? DJD (degenerative joint disease), cervical 03/15/2016  ? DDD (degenerative disc disease), lumbar 03/15/2016  ? HTN (hypertension) 03/15/2016  ? Obesity 03/15/2016  ? Elevated cholesterol 03/15/2016  ? Neuralgia 03/15/2016  ? Malignant melanoma of right side of neck ([place]) 03/15/2016  ? BBB (bundle branch block) 03/15/2016  ? High risk medication use 03/15/2016  ? Osteoarthritis of lumbar spine 03/15/2016  ? Primary osteoarthritis of both hands 03/15/2016  ? Primary osteoarthritis of both knees 03/15/2016  ? Mesenteric adenitis 06/29/2015  ?  ?Past Medical History:  ?Diagnosis Date  ? Arthritis   ?  BBB (bundle branch block) 03/15/2016  ? Carpal tunnel syndrome   ? DDD (degenerative disc disease), lumbar 03/15/2016  ? Depression   ? Dysrhythmia   ? had some tachycardia with steroids  ? Elevated cholesterol 03/15/2016  ? Headache(784.0)   ? Ocular Migraines - takes Ambien for it  ? Heart murmur   ? HTN  (hypertension) 03/15/2016  ? Hyperlipemia   ? Hypertension   ? Malignant melanoma of right side of neck ([place]) 03/15/2016  ? 30 years ago   ? Neuralgia 03/15/2016  ? PPD positive   ? due to immunotherapy from melanoma  ? Rheumatoid arthritis ([place]) 03/15/2016  ? Sero Negative.   ? RLS (restless legs syndrome)   ? Sleep apnea   ? could not use a cpap  ? Snores   ? Status post gastric banding   ? Wears glasses   ? driving  ?  ?Family History  ?Problem Relation Age of Onset  ? Heart disease Mother   ? Alzheimer's disease Father   ? ?Past Surgical History:  ?Procedure Laterality Date  ? ANTERIOR CERVICAL DECOMPRESSION/DISCECTOMY FUSION 4 LEVELS N/A 02/26/2020  ? Procedure: ANTERIOR CERVICAL DECOMPRESSION/DISCECTOMY FUSION, INTERBODY PROSTHESIS, PLATE/SCREWS CERVICAL THREE-FOUR CERVICAL FOUR-FIVE CERVICAL FIVE-SIX- CERVICAL SIX-SEVEN;  Surgeon: Newman Pies, MD;  Location: [facility];  Service: Neurosurgery;  Laterality: N/A;  ? CARPAL TUNNEL RELEASE Right 09/26/2012  ? Procedure: CARPAL TUNNEL RELEASE;  Surgeon: Tennis Must, MD;  Location: [place];  Service: Orthopedics;  Laterality: Right;  ? CARPAL TUNNEL RELEASE Left 10/24/2012  ? Procedure: CARPAL TUNNEL RELEASE;  Surgeon: Tennis Must, MD;  Location: [place];  Service: Orthopedics;  Laterality: Left;  ? EXCISION MELANOMA WITH SENTINEL LYMPH NODE BIOPSY  1984  ? rt neck with mulpipal nodes and some muscle excised rt neck-shoulder  ? KNEE ARTHROPLASTY    ? knee replacement    ? LAPAROSCOPIC GASTRIC BANDING  03/15/2009  ? LAPAROSCOPIC GASTRIC BANDING    ? MUSCLE BIOPSY  2004  ? rt thigh to r/o myocitis  ? nerve conduction velosity test    ? TONSILLECTOMY    ? TOTAL KNEE ARTHROPLASTY Bilateral 12/05/2010  ? TRIGGER FINGER RELEASE Right 09/26/2012  ? Procedure: RELEASE TRIGGER FINGER/A-1 PULLEY RING AND SMALL ;  Surgeon: Tennis Must, MD;  Location: [place];  Service: Orthopedics;  Laterality: Right;  ? TRIGGER  FINGER RELEASE Left 10/24/2012  ? Procedure: RELEASE TRIGGER FINGER/A-1 PULLEY LEFT MIDDLE AND LEFT RING;  Surgeon: Tennis Must, MD;  Location: [place];  Service: Orthopedics;  Laterality: Left;  ? UPPER GI ENDOSCOPY    ? ?Social History  ? ?Social History Narrative  ? Not on file  ? ?Immunization History  ?Administered Date(s) Administered  ? Moderna Sars-Covid-2 Vaccination 06/10/2019, 07/08/2019  ? PFIZER(Purple Top)SARS-COV-2 Vaccination 05/21/2020  ?  ? ?Objective: ?Vital Signs: BP 138/81 (BP Location: Left Arm, Patient Position: Sitting, Cuff Size: Large)   Pulse 73   Ht '5\' 8"'$  (1.727 m)   Wt 263 lb (119.3 kg)   BMI 39.99 kg/m?   ? ?Physical Exam ?Vitals and nursing note reviewed.  ?Constitutional:   ?   Appearance: He is well-developed.  ?HENT:  ?   Head: Normocephalic and atraumatic.  ?Eyes:  ?   Conjunctiva/sclera: Conjunctivae normal.  ?   Pupils: Pupils are equal, round, and reactive to light.  ?Cardiovascular:  ?   Rate and Rhythm: Normal rate and regular rhythm.  ?  Heart sounds: Normal heart sounds.  ?Pulmonary:  ?   Effort: Pulmonary effort is normal.  ?   Breath sounds: Normal breath sounds.  ?Abdominal:  ?   General: Bowel sounds are normal.  ?   Palpations: Abdomen is soft.  ?Musculoskeletal:  ?   Cervical back: Normal range of motion and neck supple.  ?Skin: ?   General: Skin is warm and dry.  ?   Capillary Refill: Capillary refill takes less than 2 seconds.  ?Neurological:  ?   Mental Status: He is alert and oriented to person, place, and time.  ?Psychiatric:     ?   Behavior: Behavior normal.  ?  ? ?Musculoskeletal Exam: C-spine has limited ROM. No midline spinal tenderness. Painful ROM of the right shoulder. Tenderness over the right subacromial bursa. Limited abduction and internal rotation.  Left shoulder has good ROM with no discomfort.  Elbow joints, wrist joints, MCPs, PIPs, and DIPs good ROM with no synovitis.  PIP and DIP thickening consistent with osteoarthritis  of both hands.  Hip joints good ROM with no discomfort.  Bilateral knee replacements have good ROM with some discomfort in the left knee. No warmth or effusion of knee joints.  No tenderness or swelling of ankle joints.  ?

## 2021-08-25 NOTE — Telephone Encounter (Signed)
Patient called into the office he wanted a shoulder injection but stated he can not wait until the 17th and wanted to know if anyone else can give the injection or If he can get in sooner ??  ? ?Please advise  ?

## 2021-08-29 ENCOUNTER — Ambulatory Visit (INDEPENDENT_AMBULATORY_CARE_PROVIDER_SITE_OTHER): Payer: Medicare Other | Admitting: Physician Assistant

## 2021-08-29 ENCOUNTER — Encounter: Payer: Self-pay | Admitting: Physician Assistant

## 2021-08-29 VITALS — BP 138/81 | HR 73 | Ht 68.0 in | Wt 263.0 lb

## 2021-08-29 DIAGNOSIS — M19042 Primary osteoarthritis, left hand: Secondary | ICD-10-CM

## 2021-08-29 DIAGNOSIS — M7551 Bursitis of right shoulder: Secondary | ICD-10-CM

## 2021-08-29 DIAGNOSIS — Z79899 Other long term (current) drug therapy: Secondary | ICD-10-CM | POA: Diagnosis not present

## 2021-08-29 DIAGNOSIS — Z96653 Presence of artificial knee joint, bilateral: Secondary | ICD-10-CM

## 2021-08-29 DIAGNOSIS — L0100 Impetigo, unspecified: Secondary | ICD-10-CM

## 2021-08-29 DIAGNOSIS — M19011 Primary osteoarthritis, right shoulder: Secondary | ICD-10-CM | POA: Diagnosis not present

## 2021-08-29 DIAGNOSIS — M19041 Primary osteoarthritis, right hand: Secondary | ICD-10-CM

## 2021-08-29 DIAGNOSIS — M5030 Other cervical disc degeneration, unspecified cervical region: Secondary | ICD-10-CM

## 2021-08-29 DIAGNOSIS — M19012 Primary osteoarthritis, left shoulder: Secondary | ICD-10-CM

## 2021-08-29 DIAGNOSIS — M0609 Rheumatoid arthritis without rheumatoid factor, multiple sites: Secondary | ICD-10-CM

## 2021-08-29 DIAGNOSIS — M5136 Other intervertebral disc degeneration, lumbar region: Secondary | ICD-10-CM

## 2021-08-29 DIAGNOSIS — Z8639 Personal history of other endocrine, nutritional and metabolic disease: Secondary | ICD-10-CM

## 2021-08-29 DIAGNOSIS — M51369 Other intervertebral disc degeneration, lumbar region without mention of lumbar back pain or lower extremity pain: Secondary | ICD-10-CM

## 2021-08-29 DIAGNOSIS — M62838 Other muscle spasm: Secondary | ICD-10-CM

## 2021-08-29 DIAGNOSIS — Z85820 Personal history of malignant melanoma of skin: Secondary | ICD-10-CM

## 2021-08-29 DIAGNOSIS — M792 Neuralgia and neuritis, unspecified: Secondary | ICD-10-CM

## 2021-08-29 MED ORDER — TRIAMCINOLONE ACETONIDE 40 MG/ML IJ SUSP
40.0000 mg | INTRAMUSCULAR | Status: AC | PRN
  Administered 2021-08-29: 40 mg via INTRA_ARTICULAR

## 2021-08-29 MED ORDER — LIDOCAINE HCL 1 % IJ SOLN
1.5000 mL | INTRAMUSCULAR | Status: AC | PRN
  Administered 2021-08-29: 1.5 mL

## 2021-08-29 NOTE — Patient Instructions (Signed)
Standing Labs ?We placed an order today for your standing lab work.  ? ?Please have your standing labs drawn in July and every 3 months  ? ?If possible, please have your labs drawn 2 weeks prior to your appointment so that the provider can discuss your results at your appointment. ? ?Please note that you may see your imaging and lab results in MyChart before we have reviewed them. ?We may be awaiting multiple results to interpret others before contacting you. ?Please allow our office up to 72 hours to thoroughly review all of the results before contacting the office for clarification of your results. ? ?We have open lab daily: ?Monday through Thursday from 1:30-4:30 PM and Friday from 1:30-4:00 PM ?at the office of Dr. Shaili Deveshwar, [facility].   ?Please be advised, all patients with office appointments requiring lab work will take precedent over walk-in lab work.  ?If possible, please come for your lab work on Monday and Friday afternoons, as you may experience shorter wait times. ?The office is located at [address], [address], [place], [place] [address] ?No appointment is necessary.   ?Labs are drawn by [facility]. Please bring your co-pay at the time of your lab draw.  You may receive a bill from [facility] for your lab work. ? ?Please note if you are on Hydroxychloroquine and and an order has been placed for a Hydroxychloroquine level, you will need to have it drawn 4 hours or more after your last dose. ? ?If you wish to have your labs drawn at another location, please call the office 24 hours in advance to send orders. ? ?If you have any questions regarding directions or hours of operation,  ?please call [phone].   ?As a reminder, please drink plenty of water prior to coming for your lab work. Thanks! ? ?

## 2021-08-30 LAB — COMPLETE METABOLIC PANEL WITH GFR
AG Ratio: 1.5 (calc) (ref 1.0–2.5)
ALT: 25 U/L (ref 9–46)
AST: 30 U/L (ref 10–35)
Albumin: 4 g/dL (ref 3.6–5.1)
Alkaline phosphatase (APISO): 75 U/L (ref 35–144)
BUN: 17 mg/dL (ref 7–25)
CO2: 28 mmol/L (ref 20–32)
Calcium: 9.7 mg/dL (ref 8.6–10.3)
Chloride: 104 mmol/L (ref 98–110)
Creat: 1.11 mg/dL (ref 0.70–1.35)
Globulin: 2.7 g/dL (calc) (ref 1.9–3.7)
Glucose, Bld: 98 mg/dL (ref 65–99)
Potassium: 5.2 mmol/L (ref 3.5–5.3)
Sodium: 139 mmol/L (ref 135–146)
Total Bilirubin: 0.3 mg/dL (ref 0.2–1.2)
Total Protein: 6.7 g/dL (ref 6.1–8.1)
eGFR: 72 mL/min/{1.73_m2} (ref >=60)

## 2021-08-30 LAB — CBC WITH DIFFERENTIAL/PLATELET
Absolute Monocytes: 1023 cells/uL — ABNORMAL HIGH (ref 200–950)
Basophils Absolute: 79 cells/uL (ref 0–200)
Basophils Relative: 1.2 %
Eosinophils Absolute: 416 cells/uL (ref 15–500)
Eosinophils Relative: 6.3 %
HCT: 46.5 % (ref 38.5–50.0)
Hemoglobin: 14.9 g/dL (ref 13.2–17.1)
Lymphs Abs: 2607 cells/uL (ref 850–3900)
MCH: 30.9 pg (ref 27.0–33.0)
MCHC: 32 g/dL (ref 32.0–36.0)
MCV: 96.5 fL (ref 80.0–100.0)
MPV: 10.1 fL (ref 7.5–12.5)
Monocytes Relative: 15.5 %
Neutro Abs: 2475 cells/uL (ref 1500–7800)
Neutrophils Relative %: 37.5 %
Platelets: 277 10*3/uL (ref 140–400)
RBC: 4.82 10*6/uL (ref 4.20–5.80)
RDW: 14.3 % (ref 11.0–15.0)
Total Lymphocyte: 39.5 %
WBC: 6.6 10*3/uL (ref 3.8–10.8)

## 2021-08-30 NOTE — Progress Notes (Signed)
Absolute monocytes are borderline elevated.  Rest of CBC WNL.  CMP WNL.

## 2021-09-06 ENCOUNTER — Ambulatory Visit (INDEPENDENT_AMBULATORY_CARE_PROVIDER_SITE_OTHER): Payer: Medicare Other | Admitting: Physician Assistant

## 2021-09-06 VITALS — BP 143/73 | HR 75

## 2021-09-06 DIAGNOSIS — M25511 Pain in right shoulder: Secondary | ICD-10-CM | POA: Diagnosis not present

## 2021-09-06 DIAGNOSIS — G8929 Other chronic pain: Secondary | ICD-10-CM

## 2021-09-06 MED ORDER — LIDOCAINE HCL 1 % IJ SOLN
1.0000 mL | INTRAMUSCULAR | Status: AC | PRN
  Administered 2021-09-06: 1 mL

## 2021-09-06 MED ORDER — TRIAMCINOLONE ACETONIDE 40 MG/ML IJ SUSP
40.0000 mg | INTRAMUSCULAR | Status: AC | PRN
  Administered 2021-09-06: 40 mg via INTRA_ARTICULAR

## 2021-09-06 NOTE — Progress Notes (Signed)
? ?  Procedure Note ? ?Patient: David [name]             ?Date of Birth: 1950-06-03           ?MRN: [id number]             ?Visit Date: 09/06/2021 ? ?Procedures: ?Visit Diagnoses:  ?1. Chronic right shoulder pain   ? ? ?Large Joint Inj: R glenohumeral on 09/06/2021 9:21 AM ?Indications: pain ?Details: 27 G 1.5 in needle, anterior approach ? ?Arthrogram: No ? ?Medications: 1 mL lidocaine 1 %; 40 mg triamcinolone acetonide 40 MG/ML ?Aspirate: 0 mL ?Outcome: tolerated well, no immediate complications ?Procedure, treatment alternatives, risks and benefits explained, specific risks discussed. Consent was given by the patient. Immediately prior to procedure a time out was called to verify the correct patient, procedure, equipment, support staff and site/side marked as required. Patient was prepped and draped in the usual sterile fashion.  ? ? ?Patient's right shoulder was re-examined today in the office.  He has tenderness anteriorly and pain with forward flexion of the right shoulder.  Negative crossover test. He has minimal discomfort with internal rotation.   ?A right glenohumeral joint (anterior approach) cortisone injection was performed today by Dr. Estanislado Pandy.  ?Patient tolerated the procedure well.  Aftercare was discussed.  ?He was advised to notify us if his symptoms persist or worsen.  ? ?Hazel Sams, PA-C  ? ? ?

## 2021-09-21 ENCOUNTER — Ambulatory Visit: Payer: Medicare Other | Admitting: Orthopaedic Surgery

## 2021-09-23 ENCOUNTER — Ambulatory Visit: Payer: Medicare Other | Admitting: Rheumatology

## 2021-10-04 ENCOUNTER — Ambulatory Visit: Payer: Medicare Other | Admitting: Rheumatology

## 2021-10-04 DIAGNOSIS — F5104 Psychophysiologic insomnia: Secondary | ICD-10-CM | POA: Diagnosis not present

## 2021-10-04 DIAGNOSIS — G2581 Restless legs syndrome: Secondary | ICD-10-CM | POA: Diagnosis not present

## 2021-10-04 DIAGNOSIS — I10 Essential (primary) hypertension: Secondary | ICD-10-CM | POA: Diagnosis not present

## 2021-10-04 DIAGNOSIS — G4733 Obstructive sleep apnea (adult) (pediatric): Secondary | ICD-10-CM | POA: Diagnosis not present

## 2021-10-12 ENCOUNTER — Other Ambulatory Visit: Payer: Self-pay | Admitting: Physician Assistant

## 2021-10-13 DIAGNOSIS — I10 Essential (primary) hypertension: Secondary | ICD-10-CM | POA: Diagnosis not present

## 2021-10-13 DIAGNOSIS — G4733 Obstructive sleep apnea (adult) (pediatric): Secondary | ICD-10-CM | POA: Diagnosis not present

## 2021-10-13 DIAGNOSIS — G2581 Restless legs syndrome: Secondary | ICD-10-CM | POA: Diagnosis not present

## 2021-10-13 NOTE — Telephone Encounter (Signed)
Next Visit: 02/08/2022  Last Visit: 08/29/2021  Last Fill: 08/08/2021  DX: Rheumatoid arthritis of multiple sites with negative rheumatoid factor   Current Dose per office note 08/29/2021: sulfasalazine 500 mg 2 tablets po BID.   Labs: 08/29/2021, Absolute monocytes are borderline elevated.  Rest of CBC WNL.  CMP WNL.    Okay to refill SSZ?

## 2021-10-26 ENCOUNTER — Encounter: Payer: Self-pay | Admitting: Orthopaedic Surgery

## 2021-10-26 ENCOUNTER — Ambulatory Visit (INDEPENDENT_AMBULATORY_CARE_PROVIDER_SITE_OTHER): Payer: Medicare Other | Admitting: Orthopaedic Surgery

## 2021-10-26 DIAGNOSIS — H5213 Myopia, bilateral: Secondary | ICD-10-CM | POA: Diagnosis not present

## 2021-10-26 DIAGNOSIS — M7541 Impingement syndrome of right shoulder: Secondary | ICD-10-CM | POA: Diagnosis not present

## 2021-10-26 NOTE — Progress Notes (Signed)
Office Visit Note   Patient: David [name]           Date of Birth: November 01, 1950           MRN: [id number] Visit Date: 10/26/2021              Requested by: Orpah Melter, MD [address] [place],  [place] [address] PCP: Orpah Melter, MD   Assessment & Plan: Visit Diagnoses:  1. Impingement syndrome of right shoulder     Plan: Given patient's continued right shoulder pain despite time for conservative management which included multiple injections without relief recommend right shoulder arthroscopy with subacromial decompression.  Risk benefits of surgery discussed with patient postop protocol discussed with patient at length by Dr. Ninfa Linden and myself.  Also discussed possibility rotator cuff repair and what the postoperative protocol would look like however this is highly unlikely.  Per his request we will proceed with right shoulder arthroscopy after July 8.  He will follow-up with [place] 1 week postop.  Follow-Up Instructions: Return for post op.   Orders:  No orders of the defined types were placed in this encounter.  No orders of the defined types were placed in this encounter.     Procedures: No procedures performed   Clinical Data: No additional findings.   Subjective: Chief Complaint  Patient presents with   Right Shoulder - Pain, Follow-up    HPI Mr. [name] comes in today for follow-up of his right shoulder.  We last saw him in 2021 for his right shoulder they have undergone an MRI of his right shoulder showed severe tendinosis of the supraspinatus tendon with some partial tears.  Small subscapularis partial tear.  Moderate AC joint arthritis and biceps tendinosis.  He has had no new injury to his shoulder.  He has undergone cervical surgery by Dr. Arnoldo Morale and states this symptomatically change his life.  He is taking ibuprofen Tylenol using Voltaren gel also for right shoulder pain.  He has tried 2 cortisone injections with Dr. Dora Sims without any real  relief.  He denies any radicular symptoms down either arm.  He has had a history of prior surgical intervention about the posterior aspect of the right shoulder secondary to malignant myeloma years ago.  Review of Systems  Constitutional:  Negative for chills and fever.    Objective: Vital Signs: There were no vitals taken for this visit.  Physical Exam Constitutional:      Appearance: He is not ill-appearing or diaphoretic.  Pulmonary:     Effort: Pulmonary effort is normal.  Neurological:     Mental Status: He is alert and oriented to person, place, and time.  Psychiatric:        Mood and Affect: Mood normal.    Ortho Exam Bilateral shoulders: 5 out of 5 strength with external and internal rotation against resistance.  Negative liftoff test bilaterally.  Negative empty can test bilaterally.  Limited abduction right shoulder compared to full abduction left shoulder.  Positive impingement on the right negative on the left. Specialty Comments:  No specialty comments available.  Imaging: No results found.   PMFS History: Patient Active Problem List   Diagnosis Date Noted   Cervical spondylosis with radiculopathy 02/26/2020   SBO (small bowel obstruction) ([place]) 08/21/2017   Trochanteric bursitis of left hip 01/18/2017   Lateral epicondylitis, right elbow 01/18/2017   Rheumatoid arthritis ([place]) 03/15/2016   DJD (degenerative joint disease), cervical 03/15/2016   DDD (degenerative disc disease),  lumbar 03/15/2016   HTN (hypertension) 03/15/2016   Obesity 03/15/2016   Elevated cholesterol 03/15/2016   Neuralgia 03/15/2016   Malignant melanoma of right side of neck ([place]) 03/15/2016   BBB (bundle branch block) 03/15/2016   High risk medication use 03/15/2016   Osteoarthritis of lumbar spine 03/15/2016   Primary osteoarthritis of both hands 03/15/2016   Primary osteoarthritis of both knees 03/15/2016   Mesenteric adenitis 06/29/2015   Past Medical History:  Diagnosis Date    Arthritis    BBB (bundle branch block) 03/15/2016   Carpal tunnel syndrome    DDD (degenerative disc disease), lumbar 03/15/2016   Depression    Dysrhythmia    had some tachycardia with steroids   Elevated cholesterol 03/15/2016   Headache(784.0)    Ocular Migraines - takes Ambien for it   Heart murmur    HTN (hypertension) 03/15/2016   Hyperlipemia    Hypertension    Malignant melanoma of right side of neck ([place]) 03/15/2016   30 years ago    Neuralgia 03/15/2016   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    RLS (restless legs syndrome)    Sleep apnea    could not use a cpap   Snores    Status post gastric banding    Wears glasses    driving    Family History  Problem Relation Age of Onset   Heart disease Mother    Alzheimer's disease Father     Past Surgical History:  Procedure Laterality Date   ANTERIOR CERVICAL DECOMPRESSION/DISCECTOMY FUSION 4 LEVELS N/A 02/26/2020   Procedure: ANTERIOR CERVICAL DECOMPRESSION/DISCECTOMY FUSION, INTERBODY PROSTHESIS, PLATE/SCREWS CERVICAL THREE-FOUR CERVICAL FOUR-FIVE CERVICAL FIVE-SIX- CERVICAL SIX-SEVEN;  Surgeon: Newman Pies, MD;  Location: [facility];  Service: Neurosurgery;  Laterality: N/A;   CARPAL TUNNEL RELEASE Right 09/26/2012   Procedure: CARPAL TUNNEL RELEASE;  Surgeon: Tennis Must, MD;  Location: [facility];  Service: Orthopedics;  Laterality: Right;   CARPAL TUNNEL RELEASE Left 10/24/2012   Procedure: CARPAL TUNNEL RELEASE;  Surgeon: Tennis Must, MD;  Location: [facility];  Service: Orthopedics;  Laterality: Left;   EXCISION MELANOMA WITH SENTINEL LYMPH NODE BIOPSY  1984   rt neck with mulpipal nodes and some muscle excised rt neck-shoulder   KNEE ARTHROPLASTY     knee replacement     LAPAROSCOPIC GASTRIC BANDING  03/15/2009   LAPAROSCOPIC GASTRIC BANDING     MUSCLE BIOPSY  2004   rt thigh to r/o myocitis   nerve  conduction velosity test     TONSILLECTOMY     TOTAL KNEE ARTHROPLASTY Bilateral 12/05/2010   TRIGGER FINGER RELEASE Right 09/26/2012   Procedure: RELEASE TRIGGER FINGER/A-1 PULLEY RING AND SMALL ;  Surgeon: Tennis Must, MD;  Location: [facility];  Service: Orthopedics;  Laterality: Right;   TRIGGER FINGER RELEASE Left 10/24/2012   Procedure: RELEASE TRIGGER FINGER/A-1 PULLEY LEFT MIDDLE AND LEFT RING;  Surgeon: Tennis Must, MD;  Location: [facility];  Service: Orthopedics;  Laterality: Left;   UPPER GI ENDOSCOPY     Social History   Occupational History   Not on file  Tobacco Use   Smoking status: Former    Packs/day: 1.00    Years: 30.00    Pack years: 30.00    Types: Cigarettes    Quit date: 09/23/2001    Years since quitting: 20.1  Passive exposure: Never   Smokeless tobacco: Never  Vaping Use   Vaping Use: Never used  Substance and Sexual Activity   Alcohol use: Not Currently    Comment: rare 2-3 per year   Drug use: No   Sexual activity: Not on file

## 2021-10-27 DIAGNOSIS — G4733 Obstructive sleep apnea (adult) (pediatric): Secondary | ICD-10-CM | POA: Diagnosis not present

## 2021-11-07 ENCOUNTER — Telehealth: Payer: Self-pay | Admitting: Rheumatology

## 2021-11-07 NOTE — Telephone Encounter (Unsigned)
Patient's wife David [name] called requesting prescription refill of Sulfasalazine 500 mg tablets to be sent to [facility] at [facility].  Patient is leaving early Wednesday morning 11/09/21 and will be gone for 2 weeks and requesting to pick up the prescription tomorrow, 11/08/21.

## 2021-11-08 NOTE — Telephone Encounter (Signed)
Spoke with David [name] and advised we sent a refill on the SSZ in on 10/13/2021 for a 90 day supply. She states she will reach out to the pharmacy.

## 2021-11-26 DIAGNOSIS — G4733 Obstructive sleep apnea (adult) (pediatric): Secondary | ICD-10-CM | POA: Diagnosis not present

## 2021-12-08 ENCOUNTER — Other Ambulatory Visit: Payer: Self-pay | Admitting: Orthopaedic Surgery

## 2021-12-08 DIAGNOSIS — M24011 Loose body in right shoulder: Secondary | ICD-10-CM | POA: Diagnosis not present

## 2021-12-08 DIAGNOSIS — M948X1 Other specified disorders of cartilage, shoulder: Secondary | ICD-10-CM | POA: Diagnosis not present

## 2021-12-08 DIAGNOSIS — M7541 Impingement syndrome of right shoulder: Secondary | ICD-10-CM | POA: Diagnosis not present

## 2021-12-08 DIAGNOSIS — M7581 Other shoulder lesions, right shoulder: Secondary | ICD-10-CM | POA: Diagnosis not present

## 2021-12-08 DIAGNOSIS — G8918 Other acute postprocedural pain: Secondary | ICD-10-CM | POA: Diagnosis not present

## 2021-12-08 MED ORDER — OXYCODONE HCL 5 MG PO TABS: 5.0000 mg | ORAL_TABLET | Freq: Four times a day (QID) | ORAL | 0 refills | Status: DC | PRN

## 2021-12-15 ENCOUNTER — Ambulatory Visit (INDEPENDENT_AMBULATORY_CARE_PROVIDER_SITE_OTHER): Payer: Medicare Other | Admitting: Orthopaedic Surgery

## 2021-12-15 ENCOUNTER — Encounter: Payer: Self-pay | Admitting: Orthopaedic Surgery

## 2021-12-15 DIAGNOSIS — Z9889 Other specified postprocedural states: Secondary | ICD-10-CM

## 2021-12-15 DIAGNOSIS — M25511 Pain in right shoulder: Secondary | ICD-10-CM

## 2021-12-15 DIAGNOSIS — G8929 Other chronic pain: Secondary | ICD-10-CM

## 2021-12-15 DIAGNOSIS — M7541 Impingement syndrome of right shoulder: Secondary | ICD-10-CM

## 2021-12-15 NOTE — Progress Notes (Signed)
The patient is following up 1 week after a right shoulder arthroscopy with debridement and subacromial decompression.  He is still very sore in his biceps tendon and I showed him his arthroscopy pictures shown extent of what we need to do.  The biceps tendon look very tenuous.  He had significant inflammation in his shoulder as well as degenerative tearing of the anterior labrum and partial tearing of the rotator cuff.  He had significant bursitis and synovitis.  Incision sites look good.  There is significant bruising to be expected but no evidence of infection no swelling.  He will stay out of his sling at this standpoint and work on gentle activities with the shoulder.  I will hold off on physical therapy until we see him back in 4 weeks to see how he is doing on his own and see at that point if we need to proceed with physical therapy.  He agrees with this treatment plan.  All question concerns were answered and addressed.  He is already off narcotics so he can drop from my standpoint.

## 2021-12-20 HISTORY — PX: SHOULDER SURGERY: SHX246

## 2021-12-26 ENCOUNTER — Other Ambulatory Visit: Payer: Self-pay | Admitting: Physician Assistant

## 2021-12-26 NOTE — Telephone Encounter (Signed)
Next Visit: 02/08/2022   Last Visit: 08/29/2021   Last Fill: 07/29/2020  DX: Rheumatoid arthritis of multiple sites with negative rheumatoid factor  Okay to refill Syringes?

## 2021-12-27 DIAGNOSIS — G4733 Obstructive sleep apnea (adult) (pediatric): Secondary | ICD-10-CM | POA: Diagnosis not present

## 2022-01-04 ENCOUNTER — Emergency Department (HOSPITAL_BASED_OUTPATIENT_CLINIC_OR_DEPARTMENT_OTHER): Payer: Medicare Other

## 2022-01-04 ENCOUNTER — Other Ambulatory Visit: Payer: Self-pay

## 2022-01-04 ENCOUNTER — Emergency Department (HOSPITAL_BASED_OUTPATIENT_CLINIC_OR_DEPARTMENT_OTHER)
Admission: EM | Admit: 2022-01-04 | Discharge: 2022-01-04 | Disposition: A | Discharge status: Home / Self Care | Payer: Medicare Other | Attending: Emergency Medicine | Admitting: Emergency Medicine

## 2022-01-04 ENCOUNTER — Encounter (HOSPITAL_BASED_OUTPATIENT_CLINIC_OR_DEPARTMENT_OTHER): Payer: Self-pay | Admitting: Emergency Medicine

## 2022-01-04 DIAGNOSIS — Y9389 Activity, other specified: Secondary | ICD-10-CM | POA: Insufficient documentation

## 2022-01-04 DIAGNOSIS — Z79899 Other long term (current) drug therapy: Secondary | ICD-10-CM | POA: Diagnosis not present

## 2022-01-04 DIAGNOSIS — X58XXXA Exposure to other specified factors, initial encounter: Secondary | ICD-10-CM | POA: Insufficient documentation

## 2022-01-04 DIAGNOSIS — M19011 Primary osteoarthritis, right shoulder: Secondary | ICD-10-CM | POA: Diagnosis not present

## 2022-01-04 DIAGNOSIS — M79601 Pain in right arm: Secondary | ICD-10-CM | POA: Diagnosis present

## 2022-01-04 DIAGNOSIS — M7021 Olecranon bursitis, right elbow: Secondary | ICD-10-CM | POA: Insufficient documentation

## 2022-01-04 DIAGNOSIS — M19021 Primary osteoarthritis, right elbow: Secondary | ICD-10-CM | POA: Diagnosis not present

## 2022-01-04 DIAGNOSIS — I10 Essential (primary) hypertension: Secondary | ICD-10-CM | POA: Insufficient documentation

## 2022-01-04 DIAGNOSIS — M7989 Other specified soft tissue disorders: Secondary | ICD-10-CM | POA: Diagnosis not present

## 2022-01-04 LAB — CBC WITH DIFFERENTIAL/PLATELET
Abs Immature Granulocytes: 0.06 10*3/uL (ref 0.00–0.07)
Basophils Absolute: 0.1 10*3/uL (ref 0.0–0.1)
Basophils Relative: 1 %
Eosinophils Absolute: 0.3 10*3/uL (ref 0.0–0.5)
Eosinophils Relative: 2 %
HCT: 43 % (ref 39.0–52.0)
Hemoglobin: 14.7 g/dL (ref 13.0–17.0)
Immature Granulocytes: 1 %
Lymphocytes Relative: 20 %
Lymphs Abs: 2.3 10*3/uL (ref 0.7–4.0)
MCH: 32 pg (ref 26.0–34.0)
MCHC: 34.2 g/dL (ref 30.0–36.0)
MCV: 93.7 fL (ref 80.0–100.0)
Monocytes Absolute: 1.2 10*3/uL — ABNORMAL HIGH (ref 0.1–1.0)
Monocytes Relative: 10 %
Neutro Abs: 7.7 10*3/uL (ref 1.7–7.7)
Neutrophils Relative %: 66 %
Platelets: 236 10*3/uL (ref 150–400)
RBC: 4.59 MIL/uL (ref 4.22–5.81)
RDW: 14.1 % (ref 11.5–15.5)
WBC: 11.5 10*3/uL — ABNORMAL HIGH (ref 4.0–10.5)
nRBC: 0 % (ref 0.0–0.2)

## 2022-01-04 LAB — SYNOVIAL CELL COUNT + DIFF, W/ CRYSTALS
Crystals, Fluid: NONE SEEN
Eosinophils-Synovial: 0 % (ref 0–1)
Lymphocytes-Synovial Fld: 1 % (ref 0–20)
Monocyte-Macrophage-Synovial Fluid: 10 % — ABNORMAL LOW (ref 50–90)
Neutrophil, Synovial: 89 % — ABNORMAL HIGH (ref 0–25)
WBC, Synovial: 19250 /mm3 — ABNORMAL HIGH (ref 0–200)

## 2022-01-04 LAB — BASIC METABOLIC PANEL
Anion gap: 7 (ref 5–15)
BUN: 20 mg/dL (ref 8–23)
CO2: 27 mmol/L (ref 22–32)
Calcium: 9.1 mg/dL (ref 8.9–10.3)
Chloride: 101 mmol/L (ref 98–111)
Creatinine, Ser: 0.92 mg/dL (ref 0.61–1.24)
GFR, Estimated: >60 mL/min (ref >60)
Glucose, Bld: 112 mg/dL — ABNORMAL HIGH (ref 70–99)
Potassium: 3.9 mmol/L (ref 3.5–5.1)
Sodium: 135 mmol/L (ref 135–145)

## 2022-01-04 LAB — C-REACTIVE PROTEIN: CRP: 1.1 mg/dL — ABNORMAL HIGH (ref <1.0)

## 2022-01-04 LAB — TROPONIN I (HIGH SENSITIVITY)
Troponin I (High Sensitivity): 6 ng/L (ref <18)
Troponin I (High Sensitivity): 5 ng/L (ref <18)

## 2022-01-04 LAB — SEDIMENTATION RATE: Sed Rate: 1 mm/hr (ref 0–16)

## 2022-01-04 MED ORDER — MORPHINE SULFATE (PF) 4 MG/ML IV SOLN
6.0000 mg | Freq: Once | INTRAVENOUS | Status: AC
Start: 2022-01-04 — End: 2022-01-04
  Administered 2022-01-04: 6 mg via INTRAMUSCULAR
  Filled 2022-01-04: qty 2

## 2022-01-04 MED ORDER — HYDROCODONE-ACETAMINOPHEN 5-325 MG PO TABS: 2.0000 | ORAL_TABLET | ORAL | 0 refills | Status: DC | PRN

## 2022-01-04 MED ORDER — KETOROLAC TROMETHAMINE 30 MG/ML IJ SOLN
15.0000 mg | Freq: Once | INTRAMUSCULAR | Status: AC
  Administered 2022-01-04: 15 mg via INTRAVENOUS
  Filled 2022-01-04: qty 1

## 2022-01-04 MED ORDER — DOXYCYCLINE HYCLATE 100 MG PO CAPS: 100.0000 mg | ORAL_CAPSULE | Freq: Two times a day (BID) | ORAL | 0 refills | Status: DC

## 2022-01-04 MED ORDER — METHOCARBAMOL 500 MG PO TABS
500.0000 mg | ORAL_TABLET | Freq: Once | ORAL | Status: AC
  Administered 2022-01-04: 500 mg via ORAL
  Filled 2022-01-04: qty 1

## 2022-01-04 NOTE — Discharge Instructions (Signed)
Take the pain medication antibiotics as prescribed.  Unfortunately with your methotrexate you should not take high-dose anti-inflammatories such as Motrin or naproxen.  The fluid culture we obtained today should be available online within the next 24 hours.  You will be called if antibiotic therapy needs to be changed.  Follow-up with Dr. Ninfa Linden in the next 2 days.  Return to the ED with worsening pain, fever, vomiting, difficulty moving the arm, increased weakness or numbness or other concerns

## 2022-01-04 NOTE — ED Triage Notes (Signed)
Pt reports right shoulder surgery x 3 weeks ago. Yesterday started having worsening pain, swelling in elbow and tingling in right arm.

## 2022-01-04 NOTE — ED Provider Notes (Signed)
[facility] EMERGENCY DEPARTMENT Provider Note   CSN: [id number] Arrival date & time: 01/04/22  0242     History  Chief Complaint  Patient presents with   Arm Pain    David [name] is a 71 y.o. male.  Patient with a history of rheumatoid arthritis on methotrexate, sleep apnea, hypertension, melanoma status postresection, recent shoulder surgery presenting with right arm pain.  Symptoms progressively worsening over the past 1 day.  He underwent right shoulder arthroscopy with debridement and subacromial decompression by Dr. Philipp Ovens on July 20.  He was doing well postoperatively and cleared for return to normal activities though he is still having significant pain to his shoulder for which she takes hydrocodone at home. Comes in tonight because of severe pain to the right shoulder and right elbow with tingling in his fingertips.  Denies any new fall or trauma.  Complains of significant swelling and pain over his right olecranon and admits to lying on it.  Pain with range of motion of the elbow as well as shoulder.  Numbness diffusely in his hand.  No head, neck, back, chest or abdominal pain.  No fever.  No weakness in his hand. States the elbow pain is causing his shoulder to hurt more and denies any new trauma to the shoulder or elbow.  The history is provided by the patient and the spouse.  Arm Pain Pertinent negatives include no abdominal pain.       Home Medications Prior to Admission medications   Medication Sig Start Date End Date Taking? Authorizing Provider  BD ALLERGIST TRAY 27G X 1/2" 1 ML KIT USE TO INJECT METHOTREXATE ONCE WEEKLY 12/26/21   Bo Merino, MD  DULoxetine (CYMBALTA) 60 MG capsule Take 60 mg by mouth 2 (two) times daily.     [provider]  folic acid (FOLVITE) 1 MG tablet TAKE TWO TABLETS BY MOUTH DAILY 04/20/21   Ofilia Neas, PA-C  gabapentin (NEURONTIN) 300 MG capsule TAKE THREE CAPSULES BY MOUTH THREE TIMES A DAY Patient  taking differently: Take 600 mg by mouth 2 (two) times daily. 04/05/20   Mcarthur Rossetti, MD  hydrochlorothiazide (HYDRODIURIL) 25 MG tablet Take 25 mg by mouth daily.    [provider]  hydroxychloroquine (PLAQUENIL) 200 MG tablet TAKE ONE TABLET BY MOUTH TWICE A DAY 04/20/21   Ofilia Neas, PA-C  methotrexate 50 MG/2ML injection INJECT 1ML UNDER THE SKIN ONCE WEEKLY 07/08/21   Ofilia Neas, PA-C  metoprolol succinate (TOPROL-XL) 25 MG 24 hr tablet Take 25 mg by mouth daily. 11/24/14   [provider]  oxyCODONE (ROXICODONE) 5 MG immediate release tablet Take 1-2 tablets (5-10 mg total) by mouth every 6 (six) hours as needed for severe pain. 12/08/21   Mcarthur Rossetti, MD  pantoprazole (PROTONIX) 40 MG tablet Take 40 mg by mouth daily. 08/24/21   [provider]  rOPINIRole (REQUIP) 4 MG tablet Take 4 mg by mouth every evening.     [provider]  simvastatin (ZOCOR) 40 MG tablet Take 40 mg by mouth daily.    [provider]  sulfaSALAzine (AZULFIDINE) 500 MG tablet TAKE TWO TABLETS BY MOUTH TWICE A DAY 10/13/21   Bo Merino, MD  tamsulosin (FLOMAX) 0.4 MG CAPS capsule Take 0.4 mg by mouth daily.     [provider]  UNABLE TO FIND daily. Med Name: Aspirin-Tylenol-Caffeine    [provider]      Allergies    Codeine, Dilaudid [  hydromorphone hcl], Fentanyl, Nucynta [tapentadol], and Penicillins    Review of Systems   Review of Systems  Gastrointestinal:  Negative for abdominal pain, nausea and vomiting.  Musculoskeletal:  Positive for arthralgias, joint swelling and myalgias.   all other systems are negative except as noted in the HPI and PMH.    Physical Exam Updated Vital Signs Temp 97.8 F (36.6 C) (Oral)   Resp 20   Ht '5\' 8"'  (1.727 m)   Wt 113.4 kg   SpO2 95%   BMI 38.01 kg/m  Physical Exam Vitals and nursing note reviewed.  Constitutional:      General: He is not in acute distress.     Appearance: He is well-developed. He is obese.     Comments: uncomfortable  HENT:     Head: Normocephalic and atraumatic.     Mouth/Throat:     Pharynx: No oropharyngeal exudate.  Eyes:     Conjunctiva/sclera: Conjunctivae normal.     Pupils: Pupils are equal, round, and reactive to light.  Neck:     Comments: No meningismus. Cardiovascular:     Rate and Rhythm: Normal rate and regular rhythm.     Heart sounds: Normal heart sounds. No murmur heard. Pulmonary:     Effort: Pulmonary effort is normal. No respiratory distress.     Breath sounds: Normal breath sounds.  Abdominal:     Palpations: Abdomen is soft.     Tenderness: There is no abdominal tenderness. There is no guarding or rebound.  Musculoskeletal:        General: Swelling and tenderness present.     Cervical back: Normal range of motion and neck supple.     Comments: Well-healing surgical incisions to right shoulder.  Able to range with some discomfort.  No warmth or erythema.  Significant swelling overlying right olecranon with warmth and erythema.  Range of motion of right elbow intact with flexion and extension.  Intact radial pulse and cardinal hand movements.  Skin:    General: Skin is warm.  Neurological:     Mental Status: He is alert and oriented to person, place, and time.     Cranial Nerves: No cranial nerve deficit.     Motor: No abnormal muscle tone.     Coordination: Coordination normal.     Comments:  5/5 strength throughout. CN 2-12 intact.Equal grip strength.   Psychiatric:        Behavior: Behavior normal.     ED Results / Procedures / Treatments   Labs (all labs ordered are listed, but only abnormal results are displayed) Labs Reviewed  CBC WITH DIFFERENTIAL/PLATELET - Abnormal; Notable for the following components:      Result Value   WBC 11.5 (*)    Monocytes Absolute 1.2 (*)    All other components within normal limits  BASIC METABOLIC PANEL - Abnormal; Notable for the following  components:   Glucose, Bld 112 (*)    All other components within normal limits  CULTURE, BLOOD (ROUTINE X 2)  CULTURE, BLOOD (ROUTINE X 2)  BODY FLUID CULTURE W GRAM STAIN  SEDIMENTATION RATE  C-REACTIVE PROTEIN  SYNOVIAL CELL COUNT + DIFF, W/ CRYSTALS  GLUCOSE, BODY FLUID OTHER            CBG MONITORING, ED  TROPONIN I (HIGH SENSITIVITY)  TROPONIN I (HIGH SENSITIVITY)    EKG EKG Interpretation  Date/Time:  Wednesday January 04 2022 04:36:22 EDT Ventricular Rate:  69 PR Interval:  171 QRS Duration: 172 QT Interval:  430 QTC Calculation: 461 R Axis:   100 Text Interpretation: Sinus rhythm Right bundle branch block No significant change was found Confirmed by Ezequiel Essex [phone]) on 01/04/2022 4:55:36 AM  Radiology DG Shoulder Right  Result Date: 01/04/2022 CLINICAL DATA:  Right shoulder surgery 3 weeks ago. EXAM: RIGHT SHOULDER - 2+ VIEW COMPARISON:  None Available. FINDINGS: There is no evidence of fracture or dislocation. Mild degenerative changes are present at the acromioclavicular and glenohumeral joints. Soft tissues are unremarkable. IMPRESSION: No acute osseous abnormality. Electronically Signed   By: Brett Fairy M.D.   On: 01/04/2022 04:08   DG Elbow Complete Right  Result Date: 01/04/2022 CLINICAL DATA:  Swollen olecranon. EXAM: RIGHT ELBOW - COMPLETE 3+ VIEW COMPARISON:  None Available. FINDINGS: There is no evidence of fracture, dislocation, or joint effusion. Moderate degenerative changes are present at the radial head and olecranon. Soft tissue swelling is present along the posterior aspect of the elbow. IMPRESSION: 1. No acute fracture or dislocation. 2. Degenerative changes at the elbow. Electronically Signed   By: Brett Fairy M.D.   On: 01/04/2022 04:06    Procedures Aspiration of blood/fluid  Date/Time: 01/04/2022 5:54 AM  Performed by: Ezequiel Essex, MD Authorized by: Ezequiel Essex, MD  Consent: Verbal consent obtained. Risks and benefits:  risks, benefits and alternatives were discussed Consent given by: patient Patient understanding: patient states understanding of the procedure being performed Patient consent: the patient's understanding of the procedure matches consent given Procedure consent: procedure consent matches procedure scheduled Relevant documents: relevant documents present and verified Test results: test results available and properly labeled Site marked: the operative site was marked Imaging studies: imaging studies available Required items: required blood products, implants, devices, and special equipment available Patient identity confirmed: verbally with patient Time out: Immediately prior to procedure a "time out" was called to verify the correct patient, procedure, equipment, support staff and site/side marked as required. Preparation: Patient was prepped and draped in the usual sterile fashion. Local anesthesia used: no  Anesthesia: Local anesthesia used: no  Sedation: Patient sedated: no  Patient tolerance: patient tolerated the procedure well with no immediate complications Comments: Aspiration of 5 mL of cloudy yellow fluid from R olecranon bursa        Medications Ordered in ED Medications  morphine (PF) 4 MG/ML injection 6 mg (has no administration in time range)  methocarbamol (ROBAXIN) tablet 500 mg (has no administration in time range)    ED Course/ Medical Decision Making/ A&P                           Medical Decision Making Amount and/or Complexity of Data Reviewed Labs: ordered. Decision-making details documented in ED Course. Radiology: ordered and independent interpretation performed. Decision-making details documented in ED Course. ECG/medicine tests: ordered and independent interpretation performed. Decision-making details documented in ED Course.  Risk Prescription drug management.  Postoperative right shoulder pain now with right elbow pain and tingling in hands as  well.  Denies any new injury.  Significant swelling overlying right olecranon concern for bursitis.  Shoulder incisions appear to be healing well.  He is able to range his shoulder with some discomfort. Majority of his pain appears to be from his elbow where he appears to have a bursitis.  He is able to range the elbow with low suspicion for septic joint.  Mild leukocytosis 11.5.  Given patient's immunocompromise state and slight erythema overlying the olecranon we will not perform drainage  of bursa so as to not introduce infection.  X-ray negative for fracture or dislocation.  Results reviewed and interpreted by me.  Low suspicion for septic joint.  Discussed with Dr. Ninfa Linden patient's orthopedic physician.  He recommends aspiration of olecranon bursitis to help decompress it.  Discussed that even if it is warm and red, he recommends drainage with prophylactic antibiotics and NSAIDs and sling.  Bursa aspiration successful as above. Fluid sent to [facility] for lab studies.  Patient to be covered with antibiotics empirically. Will give short course of pain medication after review of narcotic database.  Unfortunately, patient's methotrexate use precludes use of high dose NSAIDs.  Follow up with Dr. Ninfa Linden for a recheck this week.  Return precautions discussed.        Final Clinical Impression(s) / ED Diagnoses Final diagnoses:  Olecranon bursitis of right elbow    Rx / DC Orders ED Discharge Orders     None         Acel Natzke, Annie Main, MD 01/04/22 [phone]

## 2022-01-05 ENCOUNTER — Ambulatory Visit: Payer: Medicare Other | Admitting: Physician Assistant

## 2022-01-05 ENCOUNTER — Encounter: Payer: Self-pay | Admitting: Physician Assistant

## 2022-01-05 DIAGNOSIS — M7021 Olecranon bursitis, right elbow: Secondary | ICD-10-CM

## 2022-01-05 LAB — GLUCOSE, BODY FLUID OTHER: Glucose, Body Fluid Other: 2 mg/dL

## 2022-01-05 MED ORDER — HYDROCODONE-ACETAMINOPHEN 5-325 MG PO TABS: 2.0000 | ORAL_TABLET | ORAL | 0 refills | Status: DC | PRN

## 2022-01-05 NOTE — Progress Notes (Signed)
HPI: [name] comes in today right elbow pain and swelling.  He was seen at the [facility] in [facility] on Tuesday due to right elbow swelling.  He was found to have an olecranon bursitis.  Aspirate from the elbow was sent for cultures.  To date has been no growth no organisms.  He states that the swelling started in his elbow on Tuesday no injury.  Denies any fevers or chills.  He did not start doxycycline yesterday and is taking 2 doses of doxycycline thus far.  He had difficulty sleeping last night even taking Norco and Robaxin.  Radiographs right elbow obtained on 01/04/2022 showed no acute fracture or dislocation.  Moderate degenerative changes of the radial head and olecranon.  Review of systems: See HPI otherwise negative  Physical exam: General well-developed well-nourished male no acute distress mood and affect appropriate.  Psych alert and oriented x3  Right elbow: Has full flexion and full extension but significant pain with full extension of elbow.  Changes over the olecranon region consistent with olecranon bursitis.  Positive effusion.  No significant warmth.  Slight erythema.  Full range of motion of the right hand.  Radial pulses 2+.  Impression: Right elbow olecranon bursitis  Plan: Discussed with the patient aspirating the elbow again today and he was agreeable.  Elbow was prepped in normal fashion and then 24 cc of yellow aspirate was obtained.  Patient tolerated well.  Elbow was wrapped with an Ace bandage.  He is to keep pressure off the elbow.  He will continue his doxycycline.  Follow-up with [place] on Monday to see how he is doing.  If his condition deteriorates over the weekend he is to go to the emergency room.  Questions were encouraged and answered.

## 2022-01-06 ENCOUNTER — Telehealth: Payer: Self-pay | Admitting: Orthopaedic Surgery

## 2022-01-06 ENCOUNTER — Other Ambulatory Visit: Payer: Self-pay | Admitting: Physician Assistant

## 2022-01-06 MED ORDER — HYDROCODONE-ACETAMINOPHEN 5-325 MG PO TABS: 2.0000 | ORAL_TABLET | ORAL | 0 refills | Status: DC | PRN

## 2022-01-06 NOTE — Telephone Encounter (Signed)
Please advise 

## 2022-01-06 NOTE — Telephone Encounter (Signed)
I called David [name] and advised.

## 2022-01-06 NOTE — Telephone Encounter (Signed)
Pt's wife Manuela Schwartz called requesting a refill of hydrocodone. She states pt has been taking every 4 hrs and it's not going to last thru the weekend. Pt's wife states his elbow is still red and in sever pain. Pt as an appt Monday but don't have enough pain medication for his was given only 10 pills. Please call Manuela Schwartz at [phone]. Please send to pharmacy on file.

## 2022-01-07 LAB — BODY FLUID CULTURE W GRAM STAIN

## 2022-01-08 ENCOUNTER — Telehealth (HOSPITAL_BASED_OUTPATIENT_CLINIC_OR_DEPARTMENT_OTHER): Payer: Self-pay | Admitting: *Deleted

## 2022-01-08 NOTE — Telephone Encounter (Signed)
Post ED Visit - Positive Culture Follow-up  Culture report reviewed by antimicrobial stewardship pharmacist: [facility] [place] Team '[]'$  Elenor Quinones, Pharm.D. '[]'$  Heide Guile, Pharm.D., BCPS AQ-ID '[]'$  Parks Neptune, Pharm.D., BCPS '[]'$  Alycia Rossetti, Pharm.D., BCPS '[]'$  [place], Pharm.D., BCPS, AAHIVP '[]'$  Legrand Como, Pharm.D., BCPS, AAHIVP '[]'$  Salome Arnt, PharmD, BCPS '[]'$  Johnnette Gourd, PharmD, BCPS '[]'$  Hughes Better, PharmD, BCPS '[]'$  Leeroy Cha, PharmD '[]'$  Laqueta Linden, PharmD, BCPS '[x]'$  Bertis Ruddy, PharmD  [place] Team '[]'$  Leodis Sias, PharmD '[]'$  Lindell Spar, PharmD '[]'$  Royetta Asal, PharmD '[]'$  Graylin Shiver, Rph '[]'$  Rema Fendt) Glennon Mac, PharmD '[]'$  Arlyn Dunning, PharmD '[]'$  Netta Cedars, PharmD '[]'$  Dia Sitter, PharmD '[]'$  Leone Haven, PharmD '[]'$  Gretta Arab, PharmD '[]'$  Theodis Shove, PharmD '[]'$  Peggyann Juba, PharmD '[]'$  Reuel Boom, PharmD   Positive body fluid culture Treated with Doxycycline, organism sensitive to the same and no further patient follow-up is required at this time.  Rosie Fate 01/08/2022, 11:22 AM

## 2022-01-09 ENCOUNTER — Encounter: Payer: Self-pay | Admitting: Orthopaedic Surgery

## 2022-01-09 ENCOUNTER — Ambulatory Visit: Payer: Medicare Other | Admitting: Orthopaedic Surgery

## 2022-01-09 DIAGNOSIS — M7021 Olecranon bursitis, right elbow: Secondary | ICD-10-CM

## 2022-01-09 LAB — CULTURE, BLOOD (ROUTINE X 2)
Culture: NO GROWTH
Culture: NO GROWTH
Special Requests: ADEQUATE
Special Requests: ADEQUATE

## 2022-01-09 MED ORDER — DOXYCYCLINE HYCLATE 100 MG PO CAPS: 100.0000 mg | ORAL_CAPSULE | Freq: Two times a day (BID) | ORAL | 0 refills | Status: DC

## 2022-01-09 MED ORDER — CIPROFLOXACIN HCL 500 MG PO TABS: 500.0000 mg | ORAL_TABLET | Freq: Two times a day (BID) | ORAL | 0 refills | Status: DC

## 2022-01-09 NOTE — Progress Notes (Signed)
The patient comes in for continued follow-up as it relates to olecranon bursitis and cellulitis of the right elbow and arm.  He says it still really tender to touch and has had swelling.  The ER aspirated some fluid from the elbow and then last week we aspirated an abundant amount from the right elbow.  In the office today aspirated more fluid from the elbow area.  His swelling has gotten a little less and the redness is a little less.  He is on doxycycline twice a day.  The fluid did grow out staph epidermidis.  It is sensitive.  My plan is to continue doxycycline and add 5 days of Cipro.  I will plan to see him back in a week for repeat exam.

## 2022-01-10 ENCOUNTER — Telehealth: Payer: Self-pay | Admitting: Orthopaedic Surgery

## 2022-01-10 NOTE — Telephone Encounter (Signed)
David [name] patient's wife states the medication (CIPRO) was prescribe by Dr Ninfa Linden and after reading it states one of the side effects was swelling. Just wants to make sure this medication is ok to take.

## 2022-01-10 NOTE — Telephone Encounter (Signed)
Called and advised. Wife and pt stated understanding

## 2022-01-10 NOTE — Telephone Encounter (Signed)
Please advise 

## 2022-01-11 ENCOUNTER — Encounter: Payer: Medicare Other | Admitting: Orthopaedic Surgery

## 2022-01-16 ENCOUNTER — Encounter: Payer: Self-pay | Admitting: Orthopaedic Surgery

## 2022-01-16 ENCOUNTER — Ambulatory Visit: Payer: Medicare Other | Admitting: Orthopaedic Surgery

## 2022-01-16 DIAGNOSIS — M7021 Olecranon bursitis, right elbow: Secondary | ICD-10-CM

## 2022-01-16 NOTE — Progress Notes (Signed)
The patient comes in today for continued follow-up as it relates to his right shoulder arthroscopy but mainly his right infected olecranon bursitis.  He has turned the corner for the better.  There is still pain in his shoulder and his elbow but he looks better overall today on my exam.  The redness is minimal and there is no fluid collection aspirate around his right elbow.  His range of motion is improved and the swelling has gotten less.  He will continue the doxycycline until it is done.  I will see him back in 2 weeks for repeat evaluation of both his right shoulder and his right elbow and at that visit we will determine whether or not we would like to send him to physical therapy for the shoulder.

## 2022-01-19 DIAGNOSIS — G4733 Obstructive sleep apnea (adult) (pediatric): Secondary | ICD-10-CM | POA: Diagnosis not present

## 2022-01-19 DIAGNOSIS — E669 Obesity, unspecified: Secondary | ICD-10-CM | POA: Diagnosis not present

## 2022-01-19 DIAGNOSIS — G2581 Restless legs syndrome: Secondary | ICD-10-CM | POA: Diagnosis not present

## 2022-01-19 DIAGNOSIS — I10 Essential (primary) hypertension: Secondary | ICD-10-CM | POA: Diagnosis not present

## 2022-01-25 ENCOUNTER — Telehealth: Payer: Self-pay

## 2022-01-25 NOTE — Telephone Encounter (Signed)
Patient would like to be worked into the schedule due to having a fall on his right shoulder and hitting a rock on  01/24/2022.  Patient had right shoulder surgery 11/2021.  Cb# [phone].  Please advise.  Thank you.

## 2022-01-25 NOTE — Progress Notes (Signed)
Office Visit Note  Patient: David [name]             Date of Birth: 1950-09-21           MRN: [id number]             PCP: Orpah Melter, MD Referring: Orpah Melter, MD Visit Date: 02/08/2022 Occupation: '@GUAROCC'$ @  Subjective:  Medication management  History of Present Illness: David [name] is a 71 y.o. male with history of rheumatoid arthritis and osteoarthritis.  He underwent right shoulder joint surgery by Dr. Ninfa Linden in August 2023.  He states he came off methotrexate and sulfasalazine at the time of surgery and then resume the medications after that.  He states he also developed cellulitis in his right arm after the surgery and was treated with antibiotics.  He denies any rheumatoid arthritis flare.  He has been tolerating medications well.  He is currently on methotrexate 1 mL subcu weekly, sulfasalazine 500 mg 2 tablets twice daily and hydroxychloroquine 200 mg p.o. daily.  He states that he is scheduled for eye examination.  He is still having pain and discomfort in his right shoulder joint.  He will be starting physical therapy soon.  Activities of Daily Living:  Patient reports morning stiffness for 1 hour.   Patient Reports nocturnal pain.  Difficulty dressing/grooming: Reports Difficulty climbing stairs: Denies Difficulty getting out of chair: Denies Difficulty using hands for taps, buttons, cutlery, and/or writing: Denies  Review of Systems  Constitutional:  Positive for fatigue.  HENT:  Positive for mouth dryness. Negative for mouth sores.   Eyes:  Negative for dryness.  Respiratory:  Negative for difficulty breathing.   Cardiovascular:  Negative for chest pain and palpitations.  Gastrointestinal:  Negative for blood in stool, constipation and diarrhea.  Endocrine: Negative for increased urination.  Genitourinary:  Negative for involuntary urination.  Musculoskeletal:  Positive for joint pain, joint pain and morning stiffness. Negative for gait problem, joint  swelling, myalgias, muscle weakness, muscle tenderness and myalgias.  Skin:  Negative for color change, rash, hair loss and sensitivity to sunlight.  Allergic/Immunologic: Negative for susceptible to infections.  Neurological:  Negative for dizziness and headaches.  Hematological:  Negative for swollen glands.  Psychiatric/Behavioral:  Positive for sleep disturbance. Negative for depressed mood. The patient is not nervous/anxious.     PMFS History:  Patient Active Problem List   Diagnosis Date Noted   Cervical spondylosis with radiculopathy 02/26/2020   SBO (small bowel obstruction) ([place]) 08/21/2017   Trochanteric bursitis of left hip 01/18/2017   Lateral epicondylitis, right elbow 01/18/2017   Rheumatoid arthritis ([place]) 03/15/2016   DJD (degenerative joint disease), cervical 03/15/2016   DDD (degenerative disc disease), lumbar 03/15/2016   HTN (hypertension) 03/15/2016   Obesity 03/15/2016   Elevated cholesterol 03/15/2016   Neuralgia 03/15/2016   Malignant melanoma of right side of neck ([place]) 03/15/2016   BBB (bundle branch block) 03/15/2016   High risk medication use 03/15/2016   Osteoarthritis of lumbar spine 03/15/2016   Primary osteoarthritis of both hands 03/15/2016   Primary osteoarthritis of both knees 03/15/2016   Mesenteric adenitis 06/29/2015    Past Medical History:  Diagnosis Date   Arthritis    BBB (bundle branch block) 03/15/2016   Carpal tunnel syndrome    DDD (degenerative disc disease), lumbar 03/15/2016   Depression    Dysrhythmia    had some tachycardia with steroids   Elevated cholesterol 03/15/2016   Headache(784.0)    Ocular Migraines -  takes Ambien for it   Heart murmur    HTN (hypertension) 03/15/2016   Hyperlipemia    Hypertension    Malignant melanoma of right side of neck ([place]) 03/15/2016   30 years ago    Neuralgia 03/15/2016   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     RLS (restless legs syndrome)    Sleep apnea    could not use a cpap   Snores    Status post gastric banding    Wears glasses    driving    Family History  Problem Relation Age of Onset   Heart disease Mother    Alzheimer's disease Father    Past Surgical History:  Procedure Laterality Date   ANTERIOR CERVICAL DECOMPRESSION/DISCECTOMY FUSION 4 LEVELS N/A 02/26/2020   Procedure: ANTERIOR CERVICAL DECOMPRESSION/DISCECTOMY FUSION, INTERBODY PROSTHESIS, PLATE/SCREWS CERVICAL THREE-FOUR CERVICAL FOUR-FIVE CERVICAL FIVE-SIX- CERVICAL SIX-SEVEN;  Surgeon: Newman Pies, MD;  Location: [facility];  Service: Neurosurgery;  Laterality: N/A;   CARPAL TUNNEL RELEASE Right 09/26/2012   Procedure: CARPAL TUNNEL RELEASE;  Surgeon: Tennis Must, MD;  Location: [facility];  Service: Orthopedics;  Laterality: Right;   CARPAL TUNNEL RELEASE Left 10/24/2012   Procedure: CARPAL TUNNEL RELEASE;  Surgeon: Tennis Must, MD;  Location: [facility];  Service: Orthopedics;  Laterality: Left;   EXCISION MELANOMA WITH SENTINEL LYMPH NODE BIOPSY  1984   rt neck with mulpipal nodes and some muscle excised rt neck-shoulder   KNEE ARTHROPLASTY     knee replacement     LAPAROSCOPIC GASTRIC BANDING  03/15/2009   LAPAROSCOPIC GASTRIC BANDING     MUSCLE BIOPSY  2004   rt thigh to r/o myocitis   nerve conduction velosity test     SHOULDER SURGERY Right 12/2021   TONSILLECTOMY     TOTAL KNEE ARTHROPLASTY Bilateral 12/05/2010   TRIGGER FINGER RELEASE Right 09/26/2012   Procedure: RELEASE TRIGGER FINGER/A-1 PULLEY RING AND SMALL ;  Surgeon: Tennis Must, MD;  Location: [facility];  Service: Orthopedics;  Laterality: Right;   TRIGGER FINGER RELEASE Left 10/24/2012   Procedure: RELEASE TRIGGER FINGER/A-1 PULLEY LEFT MIDDLE AND LEFT RING;  Surgeon: Tennis Must, MD;  Location: [facility];  Service: Orthopedics;  Laterality: Left;   UPPER GI ENDOSCOPY      Social History   Social History Narrative   Not on file   Immunization History  Administered Date(s) Administered   Moderna Sars-Covid-2 Vaccination 06/10/2019, 07/08/2019   PFIZER(Purple Top)SARS-COV-2 Vaccination 05/21/2020     Objective: Vital Signs: BP (!) 143/89 (BP Location: Left Arm, Patient Position: Sitting, Cuff Size: Large)   Pulse 64   Resp 15   Ht '5\' 8"'$  (1.727 m)   Wt 257 lb (116.6 kg)   BMI 39.08 kg/m    Physical Exam Vitals and nursing note reviewed.  Constitutional:      Appearance: He is well-developed.  HENT:     Head: Normocephalic and atraumatic.  Eyes:     Conjunctiva/sclera: Conjunctivae normal.     Pupils: Pupils are equal, round, and reactive to light.  Cardiovascular:     Rate and Rhythm: Normal rate and regular rhythm.     Heart sounds: Normal heart sounds.  Pulmonary:     Effort: Pulmonary effort is normal.     Breath sounds: Normal breath sounds.  Abdominal:     General: Bowel sounds are  normal.     Palpations: Abdomen is soft.  Musculoskeletal:     Cervical back: Normal range of motion and neck supple.  Skin:    General: Skin is warm and dry.     Capillary Refill: Capillary refill takes less than 2 seconds.  Neurological:     Mental Status: He is alert and oriented to person, place, and time.  Psychiatric:        Behavior: Behavior normal.      Musculoskeletal Exam: He had limited lateral rotation of the cervical spine.  He had no tenderness over thoracic or lumbar spine.  Right shoulder abduction was limited to 90 degrees and painful.  Left shoulder joint was in good range of motion.  He had limited range of motion of his right elbow joint without synovitis.  There was bilateral PIP and DIP thickening.  No synovitis was noted over MCPs or wrist joints.  Hip joints and knee joints with good range of motion.  Bilateral knee joints were replaced and he had callus formation over bilateral patella.  There was no tenderness over ankles or  MTPs.  CDAI Exam: CDAI Score: 0.2  Patient Global: 1 mm; Provider Global: 1 mm Swollen: 0 ; Tender: 0  Joint Exam 02/08/2022   No joint exam has been documented for this visit   There is currently no information documented on the homunculus. Go to the Rheumatology activity and complete the homunculus joint exam.  Investigation: No additional findings.  Imaging: No results found.  Recent Labs: Lab Results  Component Value Date   WBC 11.5 (H) 01/04/2022   HGB 14.7 01/04/2022   PLT 236 01/04/2022   NA 135 01/04/2022   K 3.9 01/04/2022   CL 101 01/04/2022   CO2 27 01/04/2022   GLUCOSE 112 (H) 01/04/2022   BUN 20 01/04/2022   CREATININE 0.92 01/04/2022   BILITOT 0.3 08/29/2021   ALKPHOS 72 09/14/2018   AST 30 08/29/2021   ALT 25 08/29/2021   PROT 6.7 08/29/2021   ALBUMIN 3.8 09/14/2018   CALCIUM 9.1 01/04/2022   GFRAA 91 11/12/2020    Speciality Comments: PLQ Eye Exam: 02/11/2020 WNL @ [facility]  F/u in 1 year.   Procedures:  No procedures performed Allergies: Codeine, Dilaudid [hydromorphone hcl], Fentanyl, Nucynta [tapentadol], and Penicillins   Assessment / Plan:     Visit Diagnoses: Rheumatoid arthritis of multiple sites with negative rheumatoid factor (HCC)-patient states that his rheumatoid arthritis is well controlled on current combination of medications.  He denies any joint swelling or joint inflammation.  He has been having pain and discomfort in his right shoulder joint due to recent surgery.  He had limited range of motion of his right shoulder joint.  He is currently taking methotrexate 1 Emmel subcutaneous weekly, sulfasalazine 500 mg 2 tablets twice daily and Plaquenil 200 mg a day.  He had interruption in therapy at the time of surgery.  I did not notice any flare.  Patient states he developed cellulitis after the surgery which was treated with antibiotics.  High risk medication use - Methotrexate 1.0 ml sq injections once weekly, folic acid 2 mg daily,  plaquenil 200 mg 1 tablet by mouth twice daily, and sulfasalazine 500 mg 2 tablets po BID. - Plan: COMPLETE METABOLIC PANEL WITH GFR, CBC with Differential/Platelet today and then every 3 months to monitor for drug toxicity.  Has been advised to hold methotrexate and sulfasalazine if he develops an infection and resume after the infection resolves.  Information on immunization was placed in the AVS.  Primary osteoarthritis of both hands-he had bilateral PIP and DIP thickening but no synovitis was noted.  Arthritis of both acromioclavicular joints-he had surgery by Dr. Ninfa Linden in August.  He is gradually recovering from it.  He will be starting physical therapy.  He had limited range of motion and painful range of motion of his right shoulder joint today.  Patient states he developed cellulitis in his right forearm which resolved.  Hx of total knee replacement, bilateral - Dr. Alvan Dame.  He had good range of motion of bilateral knee joints without any warmth swelling or effusion.  He had calluses over bilateral knees which she relates to yard work and cleaning the pool.  DDD (degenerative disc disease), cervical - S/p fusion and discectomy October 2021 with Dr. Arnoldo Morale.  He had limited range of motion of the cervical spine.  DDD (degenerative disc disease), lumbar-he can continues to have chronic discomfort in his lumbar spine.  History of melanoma - followed by Dr. Wilhemina Bonito  Impetigo - Resolved  History of obesity    Orders: Orders Placed This Encounter  Procedures   COMPLETE METABOLIC PANEL WITH GFR   CBC with Differential/Platelet   Meds ordered this encounter  Medications   methotrexate 50 MG/2ML injection    Sig: INJECT 1ML UNDER THE SKIN ONCE WEEKLY    Dispense:  12 mL    Refill:  0   hydroxychloroquine (PLAQUENIL) 200 MG tablet    Sig: Take 1 tablet (200 mg total) by mouth 2 (two) times daily.    Dispense:  180 tablet    Refill:  0   folic acid (FOLVITE) 1 MG tablet    Sig:  Take 2 tablets (2 mg total) by mouth daily.    Dispense:  180 tablet    Refill:  3   sulfaSALAzine (AZULFIDINE) 500 MG tablet    Sig: Take 2 tablets (1,000 mg total) by mouth 2 (two) times daily.    Dispense:  360 tablet    Refill:  0   Tuberculin-Allergy Syringes 27G X 1/2" 1 ML MISC    Sig: Use to inject methotrexate once weekly.    Dispense:  12 each    Refill:  3     Follow-Up Instructions: Return in about 5 months (around 07/11/2022) for Rheumatoid arthritis, Osteoarthritis.   Bo Merino, MD  Note - This record has been created using Editor, commissioning.  Chart creation errors have been sought, but may not always  have been located. Such creation errors do not reflect on  the standard of medical care.

## 2022-01-26 ENCOUNTER — Ambulatory Visit: Payer: Medicare Other | Admitting: Physician Assistant

## 2022-01-26 ENCOUNTER — Other Ambulatory Visit: Payer: Self-pay | Admitting: *Deleted

## 2022-01-26 MED ORDER — "BD ALLERGIST TRAY 27G X 1/2"" 1 ML KIT": PACK | 1 refills | Status: DC

## 2022-01-26 NOTE — Telephone Encounter (Signed)
Refill request received via fax from [facility]. for Syringes  Next Visit: 02/08/2022  Last Visit: 08/29/2021  Last Fill: 12/26/2021  Dx: Rheumatoid arthritis of multiple sites with negative rheumatoid factor   Okay to refill Syringes?

## 2022-01-27 DIAGNOSIS — G4733 Obstructive sleep apnea (adult) (pediatric): Secondary | ICD-10-CM | POA: Diagnosis not present

## 2022-02-08 ENCOUNTER — Ambulatory Visit: Payer: Medicare Other | Admitting: Orthopaedic Surgery

## 2022-02-08 ENCOUNTER — Ambulatory Visit: Payer: Medicare Other | Attending: Rheumatology | Admitting: Rheumatology

## 2022-02-08 ENCOUNTER — Encounter: Payer: Self-pay | Admitting: Rheumatology

## 2022-02-08 ENCOUNTER — Other Ambulatory Visit: Payer: Self-pay

## 2022-02-08 ENCOUNTER — Encounter: Payer: Self-pay | Admitting: Orthopaedic Surgery

## 2022-02-08 VITALS — BP 143/89 | HR 64 | Resp 15 | Ht 68.0 in | Wt 257.0 lb

## 2022-02-08 DIAGNOSIS — Z79899 Other long term (current) drug therapy: Secondary | ICD-10-CM

## 2022-02-08 DIAGNOSIS — M0609 Rheumatoid arthritis without rheumatoid factor, multiple sites: Secondary | ICD-10-CM

## 2022-02-08 DIAGNOSIS — Z9889 Other specified postprocedural states: Secondary | ICD-10-CM

## 2022-02-08 DIAGNOSIS — M19011 Primary osteoarthritis, right shoulder: Secondary | ICD-10-CM

## 2022-02-08 DIAGNOSIS — M792 Neuralgia and neuritis, unspecified: Secondary | ICD-10-CM

## 2022-02-08 DIAGNOSIS — M25511 Pain in right shoulder: Secondary | ICD-10-CM

## 2022-02-08 DIAGNOSIS — L0100 Impetigo, unspecified: Secondary | ICD-10-CM

## 2022-02-08 DIAGNOSIS — M5030 Other cervical disc degeneration, unspecified cervical region: Secondary | ICD-10-CM

## 2022-02-08 DIAGNOSIS — M5136 Other intervertebral disc degeneration, lumbar region: Secondary | ICD-10-CM

## 2022-02-08 DIAGNOSIS — Z85820 Personal history of malignant melanoma of skin: Secondary | ICD-10-CM

## 2022-02-08 DIAGNOSIS — M19012 Primary osteoarthritis, left shoulder: Secondary | ICD-10-CM

## 2022-02-08 DIAGNOSIS — M62838 Other muscle spasm: Secondary | ICD-10-CM

## 2022-02-08 DIAGNOSIS — M19042 Primary osteoarthritis, left hand: Secondary | ICD-10-CM

## 2022-02-08 DIAGNOSIS — M19041 Primary osteoarthritis, right hand: Secondary | ICD-10-CM

## 2022-02-08 DIAGNOSIS — M7551 Bursitis of right shoulder: Secondary | ICD-10-CM

## 2022-02-08 DIAGNOSIS — Z96653 Presence of artificial knee joint, bilateral: Secondary | ICD-10-CM

## 2022-02-08 DIAGNOSIS — Z8639 Personal history of other endocrine, nutritional and metabolic disease: Secondary | ICD-10-CM

## 2022-02-08 MED ORDER — "TUBERCULIN-ALLERGY SYRINGES 27G X 1/2"" 1 ML MISC": 3 refills | Status: DC

## 2022-02-08 MED ORDER — SULFASALAZINE 500 MG PO TABS: 1000.0000 mg | ORAL_TABLET | Freq: Two times a day (BID) | ORAL | 0 refills | Status: DC

## 2022-02-08 MED ORDER — METHOTREXATE SODIUM CHEMO INJECTION 50 MG/2ML
INTRAMUSCULAR | 0 refills | Status: DC
Start: 2022-02-08 — End: 2022-07-20

## 2022-02-08 MED ORDER — METHOCARBAMOL 750 MG PO TABS: 750.0000 mg | ORAL_TABLET | Freq: Three times a day (TID) | ORAL | 1 refills | Status: DC | PRN

## 2022-02-08 MED ORDER — HYDROXYCHLOROQUINE SULFATE 200 MG PO TABS: 200.0000 mg | ORAL_TABLET | Freq: Two times a day (BID) | ORAL | 0 refills | Status: DC

## 2022-02-08 MED ORDER — FOLIC ACID 1 MG PO TABS: 2.0000 mg | ORAL_TABLET | Freq: Every day | ORAL | 3 refills | Status: DC

## 2022-02-08 NOTE — Patient Instructions (Signed)
Standing Labs We placed an order today for your standing lab work.   Please have your standing labs drawn in December and every 3 months  If possible, please have your labs drawn 2 weeks prior to your appointment so that the provider can discuss your results at your appointment.  Please note that you may see your imaging and lab results in [facility] before we have reviewed them. We may be awaiting multiple results to interpret others before contacting you. Please allow our office up to 72 hours to thoroughly review all of the results before contacting the office for clarification of your results.  We currently have open lab daily: Monday through Thursday from 1:30 PM-4:30 PM and Friday from 1:30 PM- 4:00 PM If possible, please come for your lab work on Monday, Thursday or Friday afternoons, as you may experience shorter wait times.   Effective March 22, 2022 the new lab hours will change to: Monday through Thursday from 1:30 PM-5:00 PM and Friday from 8:30 AM-12:00 PM If possible, please come for your lab work on Monday and Thursday afternoons, as you may experience shorter wait times.  Please be advised, all patients with office appointments requiring lab work will take precedent over walk-in lab work.    The office is located at [address] [place], [place], [place] [address] No appointment is necessary.   Labs are drawn by [facility]. Please bring your co-pay at the time of your lab draw.  You may receive a bill from [facility] for your lab work.  Please note if you are on Hydroxychloroquine and and an order has been placed for a Hydroxychloroquine level, you will need to have it drawn 4 hours or more after your last dose.  If you wish to have your labs drawn at another location, please call the office 24 hours in advance to send orders.  If you have any questions regarding directions or hours of operation,  please call [phone].   As a reminder, please drink plenty of water prior  to coming for your lab work. Thanks!   Vaccines You are taking a medication(s) that can suppress your immune system.  The following immunizations are recommended: Flu annually Covid-19  Td/Tdap (tetanus, diphtheria, pertussis) every 10 years Pneumonia (Prevnar 15 then Pneumovax 23 at least 1 year apart.  Alternatively, can take Prevnar 20 without needing additional dose) Shingrix: 2 doses from 4 weeks to 6 months apart  Please check with your PCP to make sure you are up to date.   If you have signs or symptoms of an infection or start antibiotics: First, call your PCP for workup of your infection. Hold your medication through the infection, until you complete your antibiotics, and until symptoms resolve if you take the following: Injectable medication (Actemra, Benlysta, Cimzia, Cosentyx, Enbrel, Humira, Kevzara, Orencia, Remicade, Simponi, Stelara, Taltz, Tremfya) Methotrexate Leflunomide (Arava) Mycophenolate (Cellcept) Morrie Sheldon, Olumiant, or Rinvoq

## 2022-02-08 NOTE — Progress Notes (Signed)
The patient comes in today for continued follow-up as it relates to his right shoulder as well as his right elbow olecranon bursitis.  He said both have improved quite a bit until recent fall injuring his right shoulder when he was doing yard work.  We have performed arthroscopic surgery on that shoulder recently.  Fortunately shoulder is moving fluidly today with no obvious new trauma to the shoulder.  His right elbow is moving smooth and olecranon bursitis has resolved.  At this point I would like to get him back into outpatient physical therapy for his right shoulder since we have performed a subacromial decompression with debridement.  Hopefully PT can improve his shoulder function.  I will send in some methocarbamol as a muscle relaxant.  He agrees with this treatment plan.  I will see him back in 6 weeks to see how he is doing overall.

## 2022-02-09 LAB — COMPLETE METABOLIC PANEL WITH GFR
AG Ratio: 1.5 (calc) (ref 1.0–2.5)
ALT: 29 U/L (ref 9–46)
AST: 33 U/L (ref 10–35)
Albumin: 4.4 g/dL (ref 3.6–5.1)
Alkaline phosphatase (APISO): 80 U/L (ref 35–144)
BUN: 20 mg/dL (ref 7–25)
CO2: 31 mmol/L (ref 20–32)
Calcium: 9.9 mg/dL (ref 8.6–10.3)
Chloride: 102 mmol/L (ref 98–110)
Creat: 1.05 mg/dL (ref 0.70–1.28)
Globulin: 2.9 g/dL (calc) (ref 1.9–3.7)
Glucose, Bld: 105 mg/dL — ABNORMAL HIGH (ref 65–99)
Potassium: 5.2 mmol/L (ref 3.5–5.3)
Sodium: 139 mmol/L (ref 135–146)
Total Bilirubin: 0.5 mg/dL (ref 0.2–1.2)
Total Protein: 7.3 g/dL (ref 6.1–8.1)
eGFR: 76 mL/min/{1.73_m2} (ref >=60)

## 2022-02-09 LAB — CBC WITH DIFFERENTIAL/PLATELET
Absolute Monocytes: 997 cells/uL — ABNORMAL HIGH (ref 200–950)
Basophils Absolute: 59 cells/uL (ref 0–200)
Basophils Relative: 0.9 %
Eosinophils Absolute: 244 cells/uL (ref 15–500)
Eosinophils Relative: 3.7 %
HCT: 45.4 % (ref 38.5–50.0)
Hemoglobin: 14.9 g/dL (ref 13.2–17.1)
Lymphs Abs: 2105 cells/uL (ref 850–3900)
MCH: 31.5 pg (ref 27.0–33.0)
MCHC: 32.8 g/dL (ref 32.0–36.0)
MCV: 96 fL (ref 80.0–100.0)
MPV: 9.8 fL (ref 7.5–12.5)
Monocytes Relative: 15.1 %
Neutro Abs: 3194 cells/uL (ref 1500–7800)
Neutrophils Relative %: 48.4 %
Platelets: 217 10*3/uL (ref 140–400)
RBC: 4.73 10*6/uL (ref 4.20–5.80)
RDW: 13.9 % (ref 11.0–15.0)
Total Lymphocyte: 31.9 %
WBC: 6.6 10*3/uL (ref 3.8–10.8)

## 2022-02-09 NOTE — Progress Notes (Signed)
CBC and CMP are normal.

## 2022-02-20 NOTE — Therapy (Signed)
OUTPATIENT PHYSICAL THERAPY SHOULDER EVALUATION   Patient Name: David [name] MRN: [id number] DOB:1951/05/08, 71 y.o., male Today's Date: 02/21/2022    PT End of Session - 02/21/22 0937     Visit Number 1    Authorization Type BC[employer] Medicare    PT Start Time [id number]   Pt arrived late   PT Stop Time 1022    PT Time Calculation (min) 45 min    Activity Tolerance Patient tolerated treatment well;Patient limited by pain    Behavior During Therapy [facility] for tasks assessed/performed             Past Medical History:  Diagnosis Date   Arthritis    BBB (bundle branch block) 03/15/2016   Carpal tunnel syndrome    DDD (degenerative disc disease), lumbar 03/15/2016   Depression    Dysrhythmia    had some tachycardia with steroids   Elevated cholesterol 03/15/2016   Headache(784.0)    Ocular Migraines - takes Ambien for it   Heart murmur    HTN (hypertension) 03/15/2016   Hyperlipemia    Hypertension    Malignant melanoma of right side of neck ([place]) 03/15/2016   30 years ago    Neuralgia 03/15/2016   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    RLS (restless legs syndrome)    Sleep apnea    could not use a cpap   Snores    Status post gastric banding    Wears glasses    driving   Past Surgical History:  Procedure Laterality Date   ANTERIOR CERVICAL DECOMPRESSION/DISCECTOMY FUSION 4 LEVELS N/A 02/26/2020   Procedure: ANTERIOR CERVICAL DECOMPRESSION/DISCECTOMY FUSION, INTERBODY PROSTHESIS, PLATE/SCREWS CERVICAL THREE-FOUR CERVICAL FOUR-FIVE CERVICAL FIVE-SIX- CERVICAL SIX-SEVEN;  Surgeon: Newman Pies, MD;  Location: [place];  Service: Neurosurgery;  Laterality: N/A;   CARPAL TUNNEL RELEASE Right 09/26/2012   Procedure: CARPAL TUNNEL RELEASE;  Surgeon: Tennis Must, MD;  Location: [place];  Service: Orthopedics;  Laterality: Right;   CARPAL TUNNEL RELEASE Left 10/24/2012   Procedure:  CARPAL TUNNEL RELEASE;  Surgeon: Tennis Must, MD;  Location: [place];  Service: Orthopedics;  Laterality: Left;   EXCISION MELANOMA WITH SENTINEL LYMPH NODE BIOPSY  1984   rt neck with mulpipal nodes and some muscle excised rt neck-shoulder   KNEE ARTHROPLASTY     knee replacement     LAPAROSCOPIC GASTRIC BANDING  03/15/2009   LAPAROSCOPIC GASTRIC BANDING     MUSCLE BIOPSY  2004   rt thigh to r/o myocitis   nerve conduction velosity test     SHOULDER SURGERY Right 12/2021   TONSILLECTOMY     TOTAL KNEE ARTHROPLASTY Bilateral 12/05/2010   TRIGGER FINGER RELEASE Right 09/26/2012   Procedure: RELEASE TRIGGER FINGER/A-1 PULLEY RING AND SMALL ;  Surgeon: Tennis Must, MD;  Location: [place];  Service: Orthopedics;  Laterality: Right;   TRIGGER FINGER RELEASE Left 10/24/2012   Procedure: RELEASE TRIGGER FINGER/A-1 PULLEY LEFT MIDDLE AND LEFT RING;  Surgeon: Tennis Must, MD;  Location: [place];  Service: Orthopedics;  Laterality: Left;   UPPER GI ENDOSCOPY     Patient Active Problem List   Diagnosis Date Noted   Cervical spondylosis with radiculopathy 02/26/2020   SBO (small bowel obstruction) ([place]) 08/21/2017   Trochanteric bursitis of left hip 01/18/2017   Lateral epicondylitis, right elbow 01/18/2017   Rheumatoid arthritis ([place])  03/15/2016   DJD (degenerative joint disease), cervical 03/15/2016   DDD (degenerative disc disease), lumbar 03/15/2016   HTN (hypertension) 03/15/2016   Obesity 03/15/2016   Elevated cholesterol 03/15/2016   Neuralgia 03/15/2016   Malignant melanoma of right side of neck ([place]) 03/15/2016   BBB (bundle branch block) 03/15/2016   High risk medication use 03/15/2016   Osteoarthritis of lumbar spine 03/15/2016   Primary osteoarthritis of both hands 03/15/2016   Primary osteoarthritis of both knees 03/15/2016   Mesenteric adenitis 06/29/2015    PCP: Orpah Melter, MD   REFERRING PROVIDER:  Mcarthur Rossetti, MD  REFERRING DIAG: [phone] (ICD-10-CM) - Status post arthroscopy of right shoulder  THERAPY DIAG:  Chronic right shoulder pain  Stiffness of right shoulder, not elsewhere classified  Abnormal posture  Muscle weakness (generalized)  Cramp and spasm  Localized edema  RATIONALE FOR EVALUATION AND TREATMENT: Rehabilitation  ONSET DATE: ~12/08/21 - R shoulder arthroscopy with debridement and subacromial decompression  NEXT MD VISIT: 03/22/2022   SUBJECTIVE:                                                                                                                                                                                      SUBJECTIVE STATEMENT: Pt is s/p R shoulder arthroscopy with debridement and subacromial decompression on ~12/08/21. Post-op course complicated by infected olecranon bursitis and cellulitis which was resolved as of his f/u with Dr. Ninfa Linden on 02/08/22. A few weeks post-op, he reports he had a fall while walking on rocks doing yard work that injured his R shoulder - MD assessment post-fall on 02/11/22 indicated that his "shoulder is moving fluidly today with no obvious new trauma to the shoulder". He now notes increased swelling in R arm with pain in R biceps and shoulder. Unable to sleep on R side due to pain. He has been focusing on scap retraction and wall walking with HEP.  PAIN: Are you having pain? Yes: NPRS scale: currently 3/10, at worst up to 9/10 Pain location: R shoulder and biceps Pain description: super-sore/bruised/strained; point tender to touch Aggravating factors: twisting movements of arm (pronation/supination), raising arm overhead or reaching back, shoulder rotation Relieving factors: 4 ibuprofen and robaxin, heat, ice relieves pain but makes the shoulder stiff  PERTINENT HISTORY: R shoulder scope 11/2021; recent R olecranon bursitis and R UE cellulitis since R shoulder scope; ACDF 02/26/20; R radical neck dissection  2 melanoma 1984; B CTR 2014; B TKA 12/05/10; OA - lumbar spine, B hands, B knees; cervical DJD; lumbar DDD; RA; HTN; BBB; neuralgia; obesity  PRECAUTIONS: None  WEIGHT BEARING RESTRICTIONS: No  FALLS:  Has patient fallen in last 6 months?  Yes. Number of falls 1 - landing on R shoulder  LIVING ENVIRONMENT: Lives with: lives with their spouse Lives in: House/apartment Stairs: Yes: Internal: 15 steps; on left going up and External: 3 steps; none Has following equipment at home: None  OCCUPATION: Retired  PLOF: Independent and Leisure: work around [employer], yard work, Development worker, international aid, remodeling  PATIENT GOALS: "Pain-free shoulder."   OBJECTIVE:   DIAGNOSTIC FINDINGS:  01/04/22 - R shoulder x-ray:  FINDINGS: There is no evidence of fracture or dislocation. Mild degenerative changes are present at the acromioclavicular and glenohumeral joints. Soft tissues are unremarkable. IMPRESSION: No acute osseous abnormality. 11/12/20 - R shoulder x-ray: No glenohumeral joint space narrowing was noted.  Acromioclavicular narrowing was noted.  No chondrocalcinosis was noted.  Impression: These findings is consistent with acromioclavicular arthritis.  PATIENT SURVEYS:  Quick Dash 43.2 / 100 = 43.2%  COGNITION: Overall cognitive status: Within functional limits for tasks assessed     SENSATION: WFL  POSTURE: rounded shoulders, forward head, and depressed/protracted R shoulder s/p radical neck dissection 2 melanoma  UPPER EXTREMITY ROM:   Active ROM Right * Eval seated Left Eval seated  Shoulder flexion 95 158  Shoulder extension 40 51  Shoulder abduction 78 171  Shoulder adduction    Shoulder internal rotation FIR to L3 FIR to T12  Shoulder external rotation Unable to FER FER to C6  Elbow flexion    Elbow extension    Wrist flexion    Wrist extension    Wrist ulnar deviation    Wrist radial deviation    Wrist pronation    Wrist supination     * Pain with all R  shoulder AROM as well as pronation/supination  Passive ROM Right * Eval supine  Shoulder flexion 151  Shoulder extension   Shoulder abduction 143  Shoulder adduction   Shoulder internal rotation 69  at 45 ABD  Shoulder external rotation 80  at 45 ABD  (Blank rows = not tested) * Pain at end ROM  UPPER EXTREMITY MMT:  MMT Right * eval Left eval  Shoulder flexion 3+ 5  Shoulder extension 4 5  Shoulder abduction 3- 5  Shoulder adduction    Shoulder internal rotation 4 5  Shoulder external rotation 3- 5  Middle trapezius    Lower trapezius    Elbow flexion    Elbow extension    Wrist flexion    Wrist extension    Wrist ulnar deviation    Wrist radial deviation    Wrist pronation    Wrist supination    Grip strength (lbs)    (Blank rows = not tested) * pain with all resisted motions  SHOULDER SPECIAL TESTS: Impingement tests: Neer impingement test: positive   PALPATION:  TTP t/o R shoulder complex, esp pecs, deltoids, teres group, biceps   TODAY'S TREATMENT:   02/21/22 THERAPEUTIC EXERCISE: to improve flexibility, strength and mobility.  Verbal and tactile cues throughout for technique.  Supine wand AAROM R shoulder flexion x 5 - cues/facilitation for scap retraction/depression to reduce pain/catching sensation   PATIENT EDUCATION: Education details: PT eval findings and anticipated POC Person educated: Patient Education method: Explanation Education comprehension: verbalized understanding   HOME EXERCISE PROGRAM: TBD - Pt encouraged to try wand AAROM flexion and continue with current HEP but cautioned to avoid forcing any painful motions   ASSESSMENT:  CLINICAL IMPRESSION: David [name] is a 71 y.o. male who was seen today for physical therapy evaluation and treatment s/p R shoulder  scope in July 2023. Post-op course has been complicated by R olecranon bursitis and R UE cellulitis as well as a fall where he landed on his R shoulder. PMH also  significant for ACDF as well as R radical neck dissection with removal of several muscles and lymph nodes due to melanoma back in 1984 which has left him with a chronically depressed R shoulder. He reports his R shoulder ROM has been somewhat limited since the radical neck dissection but not to the current degree. Current deficits include R shoulder and biceps pain along with significant point tenderness t/o R shoulder complex most pronounced in R pecs and deltoids, abnormal posture, limited and painful R shoulder and UE ROM, increased R UE edema, and decreased R shoulder strength. Pain and weakness limit his ability to move his R UE through most motions and pain prevents him from sleeping on his R side. [place] will benefit from skilled PT to address above deficits and restore functional R shoulder ROM and strength with decreased pain interference.  OBJECTIVE IMPAIRMENTS decreased ROM, decreased strength, increased edema, increased fascial restrictions, impaired perceived functional ability, increased muscle spasms, impaired flexibility, impaired UE functional use, improper body mechanics, postural dysfunction, and pain.   ACTIVITY LIMITATIONS carrying, lifting, sleeping, bed mobility, bathing, dressing, reach over head, hygiene/grooming, and caring for others  PARTICIPATION LIMITATIONS: meal prep, cleaning, laundry, driving, and yard work  PERSONAL FACTORS Age, Past/current experiences, Time since onset of injury/illness/exacerbation, and 3+ comorbidities: recent R olecranon bursitis and R UE cellulitis since R shoulder scope; ACDF 02/26/20; R radical neck dissection 2 melanoma 1984; B CTR 2014; B TKA 12/05/10; OA - lumbar spine, B hands, B knees; cervical DJD; lumbar DDD; RA; HTN; BBB; neuralgia; obesity  are also affecting patient's functional outcome.   REHAB POTENTIAL: Good  CLINICAL DECISION MAKING: Evolving/moderate complexity  EVALUATION COMPLEXITY: Moderate   GOALS: Goals reviewed with  patient? Yes  SHORT TERM GOALS: Target date: 03/21/2022   Patient will be independent with initial HEP to improve outcomes and carryover.  Baseline:  Goal status: INITIAL  2.  Patient will improve R shoulder flexion and abduction to >90 w/o pain provocation.  Baseline:  Goal status: INITIAL  3.  Patient to demonstrate improved upright posture with posterior shoulder girdle engaged to promote improved glenohumeral joint mobility. Baseline:  Goal status: INITIAL   LONG TERM GOALS: Target date: 04/18/2022   Patient will be independent with ongoing/advanced HEP for self-management at home.  Baseline:  Goal status: INITIAL  2.  Patient will report 75% improvement in R shoulder/UE pain to improve QOL.  Baseline: up to 9/10 at worst Goal status: INITIAL  3.  Patient to improve R shoulder AROM to [facility] without pain provocation to allow for increased ease of ADLs.  Baseline:  Goal status: INITIAL  4.  Patient will demonstrate improved R strength to >/= 4 to 4+/5 for functional UE use. Baseline:  Goal status: INITIAL  5  Patient will report </= 33% on QuickDASH to demonstrate improved functional ability.  Baseline: 43.2 / 100 = 43.2% Goal status: INITIAL   PLAN: PT FREQUENCY: 2x/week  PT DURATION: 8 weeks  PLANNED INTERVENTIONS: Therapeutic exercises, Therapeutic activity, Neuromuscular re-education, Patient/Family education, Self Care, Joint mobilization, Dry Needling, Electrical stimulation, Cryotherapy, Moist heat, scar mobilization, Vasopneumatic device, Ultrasound, Ionotophoresis '4mg'$ /ml Dexamethasone, Manual therapy, and Re-evaluation  PLAN FOR NEXT SESSION: Review current existing HEP and provide formal initial HEP for pain-free AAROM and scapular stabilization/postural strengthening; MT +/- DN to address pain  and abnormal muscle tension   Percival Spanish, PT 02/21/2022, 12:16 PM

## 2022-02-21 ENCOUNTER — Other Ambulatory Visit: Payer: Self-pay

## 2022-02-21 ENCOUNTER — Ambulatory Visit: Payer: Medicare Other | Attending: Orthopaedic Surgery | Admitting: Physical Therapy

## 2022-02-21 ENCOUNTER — Encounter: Payer: Self-pay | Admitting: Physical Therapy

## 2022-02-21 DIAGNOSIS — M25611 Stiffness of right shoulder, not elsewhere classified: Secondary | ICD-10-CM | POA: Insufficient documentation

## 2022-02-21 DIAGNOSIS — G8929 Other chronic pain: Secondary | ICD-10-CM | POA: Insufficient documentation

## 2022-02-21 DIAGNOSIS — R293 Abnormal posture: Secondary | ICD-10-CM | POA: Insufficient documentation

## 2022-02-21 DIAGNOSIS — M25511 Pain in right shoulder: Secondary | ICD-10-CM | POA: Insufficient documentation

## 2022-02-21 DIAGNOSIS — M6281 Muscle weakness (generalized): Secondary | ICD-10-CM | POA: Insufficient documentation

## 2022-02-21 DIAGNOSIS — Z9889 Other specified postprocedural states: Secondary | ICD-10-CM | POA: Diagnosis not present

## 2022-02-21 DIAGNOSIS — R600 Localized edema: Secondary | ICD-10-CM | POA: Insufficient documentation

## 2022-02-21 DIAGNOSIS — R252 Cramp and spasm: Secondary | ICD-10-CM | POA: Insufficient documentation

## 2022-02-28 ENCOUNTER — Ambulatory Visit: Payer: Medicare Other

## 2022-02-28 DIAGNOSIS — M25611 Stiffness of right shoulder, not elsewhere classified: Secondary | ICD-10-CM | POA: Diagnosis not present

## 2022-02-28 DIAGNOSIS — R252 Cramp and spasm: Secondary | ICD-10-CM

## 2022-02-28 DIAGNOSIS — M25511 Pain in right shoulder: Secondary | ICD-10-CM | POA: Diagnosis not present

## 2022-02-28 DIAGNOSIS — R600 Localized edema: Secondary | ICD-10-CM

## 2022-02-28 DIAGNOSIS — M6281 Muscle weakness (generalized): Secondary | ICD-10-CM

## 2022-02-28 DIAGNOSIS — Z9889 Other specified postprocedural states: Secondary | ICD-10-CM | POA: Diagnosis not present

## 2022-02-28 DIAGNOSIS — G8929 Other chronic pain: Secondary | ICD-10-CM

## 2022-02-28 DIAGNOSIS — R293 Abnormal posture: Secondary | ICD-10-CM | POA: Diagnosis not present

## 2022-02-28 NOTE — Therapy (Signed)
OUTPATIENT PHYSICAL THERAPY TREATMENT   Patient Name: WEYLIN PLAGGE MRN: [id number] DOB:[birth year], 71 y.o., male Today's Date: 02/28/2022    PT End of Session - 02/28/22 1104     Visit Number 2    Authorization Type BCBS Medicare    PT Start Time 9562    PT Stop Time 1101    PT Time Calculation (min) 44 min    Activity Tolerance Patient tolerated treatment well;Patient limited by pain    Behavior During Therapy [facility] for tasks assessed/performed              Past Medical History:  Diagnosis Date   Arthritis    BBB (bundle branch block) 03/15/2016   Carpal tunnel syndrome    DDD (degenerative disc disease), lumbar 03/15/2016   Depression    Dysrhythmia    had some tachycardia with steroids   Elevated cholesterol 03/15/2016   Headache(784.0)    Ocular Migraines - takes Ambien for it   Heart murmur    HTN (hypertension) 03/15/2016   Hyperlipemia    Hypertension    Malignant melanoma of right side of neck ([place]) 03/15/2016   30 years ago    Neuralgia 03/15/2016   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    RLS (restless legs syndrome)    Sleep apnea    could not use a cpap   Snores    Status post gastric banding    Wears glasses    driving   Past Surgical History:  Procedure Laterality Date   ANTERIOR CERVICAL DECOMPRESSION/DISCECTOMY FUSION 4 LEVELS N/A 02/26/2020   Procedure: ANTERIOR CERVICAL DECOMPRESSION/DISCECTOMY FUSION, INTERBODY PROSTHESIS, PLATE/SCREWS CERVICAL THREE-FOUR CERVICAL FOUR-FIVE CERVICAL FIVE-SIX- CERVICAL SIX-SEVEN;  Surgeon: Newman Pies, MD;  Location: [place];  Service: Neurosurgery;  Laterality: N/A;   CARPAL TUNNEL RELEASE Right 09/26/2012   Procedure: CARPAL TUNNEL RELEASE;  Surgeon: Tennis Must, MD;  Location: [place];  Service: Orthopedics;  Laterality: Right;   CARPAL TUNNEL RELEASE Left 10/24/2012   Procedure: CARPAL TUNNEL RELEASE;   Surgeon: Tennis Must, MD;  Location: [place];  Service: Orthopedics;  Laterality: Left;   EXCISION MELANOMA WITH SENTINEL LYMPH NODE BIOPSY  1984   rt neck with mulpipal nodes and some muscle excised rt neck-shoulder   KNEE ARTHROPLASTY     knee replacement     LAPAROSCOPIC GASTRIC BANDING  03/15/2009   LAPAROSCOPIC GASTRIC BANDING     MUSCLE BIOPSY  2004   rt thigh to r/o myocitis   nerve conduction velosity test     SHOULDER SURGERY Right 12/2021   TONSILLECTOMY     TOTAL KNEE ARTHROPLASTY Bilateral 12/05/2010   TRIGGER FINGER RELEASE Right 09/26/2012   Procedure: RELEASE TRIGGER FINGER/A-1 PULLEY RING AND SMALL ;  Surgeon: Tennis Must, MD;  Location: [place];  Service: Orthopedics;  Laterality: Right;   TRIGGER FINGER RELEASE Left 10/24/2012   Procedure: RELEASE TRIGGER FINGER/A-1 PULLEY LEFT MIDDLE AND LEFT RING;  Surgeon: Tennis Must, MD;  Location: [place];  Service: Orthopedics;  Laterality: Left;   UPPER GI ENDOSCOPY     Patient Active Problem List   Diagnosis Date Noted   Cervical spondylosis with radiculopathy 02/26/2020   SBO (small bowel obstruction) ([place]) 08/21/2017   Trochanteric bursitis of left hip 01/18/2017   Lateral epicondylitis, right elbow 01/18/2017   Rheumatoid arthritis ([place]) 03/15/2016   DJD (  degenerative joint disease), cervical 03/15/2016   DDD (degenerative disc disease), lumbar 03/15/2016   HTN (hypertension) 03/15/2016   Obesity 03/15/2016   Elevated cholesterol 03/15/2016   Neuralgia 03/15/2016   Malignant melanoma of right side of neck ([place]) 03/15/2016   BBB (bundle branch block) 03/15/2016   High risk medication use 03/15/2016   Osteoarthritis of lumbar spine 03/15/2016   Primary osteoarthritis of both hands 03/15/2016   Primary osteoarthritis of both knees 03/15/2016   Mesenteric adenitis 06/29/2015    PCP: Orpah Melter, MD   REFERRING PROVIDER: Mcarthur Rossetti,  MD  REFERRING DIAG: [phone] (ICD-10-CM) - Status post arthroscopy of right shoulder  THERAPY DIAG:  Chronic right shoulder pain  Stiffness of right shoulder, not elsewhere classified  Abnormal posture  Muscle weakness (generalized)  Cramp and spasm  Localized edema  RATIONALE FOR EVALUATION AND TREATMENT: Rehabilitation  ONSET DATE: ~12/08/21 - R shoulder arthroscopy with debridement and subacromial decompression  NEXT MD VISIT: 03/22/2022   SUBJECTIVE:                                                                                                                                                                                      SUBJECTIVE STATEMENT: Feels like he should be further along but he hurts more than earlier on post op.  PAIN: Are you having pain? Yes: NPRS scale: 5/10 Pain location: R shoulder and biceps Pain description: super-sore/bruised/strained; point tender to touch Aggravating factors: twisting movements of arm (pronation/supination), raising arm overhead or reaching back, shoulder rotation Relieving factors: 4 ibuprofen and robaxin, heat, ice relieves pain but makes the shoulder stiff  PERTINENT HISTORY: R shoulder scope 11/2021; recent R olecranon bursitis and R UE cellulitis since R shoulder scope; ACDF 02/26/20; R radical neck dissection 2 melanoma 1984; B CTR 2014; B TKA 12/05/10; OA - lumbar spine, B hands, B knees; cervical DJD; lumbar DDD; RA; HTN; BBB; neuralgia; obesity  PRECAUTIONS: None  WEIGHT BEARING RESTRICTIONS: No  FALLS:  Has patient fallen in last 6 months? Yes. Number of falls 1 - landing on R shoulder  LIVING ENVIRONMENT: Lives with: lives with their spouse Lives in: House/apartment Stairs: Yes: Internal: 15 steps; on left going up and External: 3 steps; none Has following equipment at home: None  OCCUPATION: Retired  PLOF: Independent and Leisure: work around [employer], yard work, Development worker, international aid, remodeling  PATIENT  GOALS: "Pain-free shoulder."   OBJECTIVE:   DIAGNOSTIC FINDINGS:  01/04/22 - R shoulder x-ray:  FINDINGS: There is no evidence of fracture or dislocation. Mild degenerative changes are present at the acromioclavicular and glenohumeral joints. Soft tissues are unremarkable. IMPRESSION: No acute osseous abnormality. 11/12/20 -  R shoulder x-ray: No glenohumeral joint space narrowing was noted.  Acromioclavicular narrowing was noted.  No chondrocalcinosis was noted.  Impression: These findings is consistent with acromioclavicular arthritis.  PATIENT SURVEYS:  Quick Dash 43.2 / 100 = 43.2%  COGNITION: Overall cognitive status: Within functional limits for tasks assessed     SENSATION: WFL  POSTURE: rounded shoulders, forward head, and depressed/protracted R shoulder s/p radical neck dissection 2 melanoma  UPPER EXTREMITY ROM:   Active ROM Right * Eval seated Left Eval seated  Shoulder flexion 95 158  Shoulder extension 40 51  Shoulder abduction 78 171  Shoulder adduction    Shoulder internal rotation FIR to L3 FIR to T12  Shoulder external rotation Unable to FER FER to C6  Elbow flexion    Elbow extension    Wrist flexion    Wrist extension    Wrist ulnar deviation    Wrist radial deviation    Wrist pronation    Wrist supination     * Pain with all R shoulder AROM as well as pronation/supination  Passive ROM Right * Eval supine  Shoulder flexion 151  Shoulder extension   Shoulder abduction 143  Shoulder adduction   Shoulder internal rotation 69  at 45 ABD  Shoulder external rotation 80  at 45 ABD  (Blank rows = not tested) * Pain at end ROM  UPPER EXTREMITY MMT:  MMT Right * eval Left eval  Shoulder flexion 3+ 5  Shoulder extension 4 5  Shoulder abduction 3- 5  Shoulder adduction    Shoulder internal rotation 4 5  Shoulder external rotation 3- 5  Middle trapezius    Lower trapezius    Elbow flexion    Elbow extension    Wrist flexion    Wrist  extension    Wrist ulnar deviation    Wrist radial deviation    Wrist pronation    Wrist supination    Grip strength (lbs)    (Blank rows = not tested) * pain with all resisted motions  SHOULDER SPECIAL TESTS: Impingement tests: Neer impingement test: positive   PALPATION:  TTP t/o R shoulder complex, esp pecs, deltoids, teres group, biceps   TODAY'S TREATMENT:  02/28/22 TherEx: Pulleys 3 min flex/3 min abduction Supine R shoulder flexion/abd/ER all with wand x 10  S/L R shoulder flexion x 10 S/L R shoulder abduction x 10  S/L ER - painful Standing row red TB x 10 Standing shld extension red TB x 10   02/21/22 THERAPEUTIC EXERCISE: to improve flexibility, strength and mobility.  Verbal and tactile cues throughout for technique.  Supine wand AAROM R shoulder flexion x 5 - cues/facilitation for scap retraction/depression to reduce pain/catching sensation   PATIENT EDUCATION: Education details: PT eval findings and anticipated POC Person educated: Patient Education method: Explanation Education comprehension: verbalized understanding   HOME EXERCISE PROGRAM: Access Code: [email or web address] URL: [email or web address] Date: 02/28/2022 Prepared by: Clarene Essex  Exercises - Sidelying Shoulder Flexion 15 Degrees  - 1 x daily - 7 x weekly - 3 sets - 10 reps - Sidelying Shoulder Abduction Palm Forward  - 1 x daily - 7 x weekly - 3 sets - 10 reps - Seated Shoulder External Rotation AAROM with Cane and Hand in Neutral  - 1 x daily - 7 x weekly - 3 sets - 10 reps - Standing Bilateral Low Shoulder Row with Anchored Resistance  - 1 x daily - 7 x weekly - 3 sets - 10 reps - Shoulder Extension  with Resistance  - 1 x daily - 7 x weekly - 3 sets - 10 reps   ASSESSMENT:  CLINICAL IMPRESSION: Good response to treatment overall. Many times during session cues were required to avoid pushing to painful ROM with OH ROM exercises. Tactile cues required to keep scapula depressed and  retracted to avoid catching/pinching in R shoulder. With instruction given pt demonstrated a good performance of exercises, would continue to put emphasis on pain free ROM and proper scapulohumeral rhythm to improve shoulder function.  OBJECTIVE IMPAIRMENTS decreased ROM, decreased strength, increased edema, increased fascial restrictions, impaired perceived functional ability, increased muscle spasms, impaired flexibility, impaired UE functional use, improper body mechanics, postural dysfunction, and pain.   ACTIVITY LIMITATIONS carrying, lifting, sleeping, bed mobility, bathing, dressing, reach over head, hygiene/grooming, and caring for others  PARTICIPATION LIMITATIONS: meal prep, cleaning, laundry, driving, and yard work  PERSONAL FACTORS Age, Past/current experiences, Time since onset of injury/illness/exacerbation, and 3+ comorbidities: recent R olecranon bursitis and R UE cellulitis since R shoulder scope; ACDF 02/26/20; R radical neck dissection 2 melanoma 1984; B CTR 2014; B TKA 12/05/10; OA - lumbar spine, B hands, B knees; cervical DJD; lumbar DDD; RA; HTN; BBB; neuralgia; obesity  are also affecting patient's functional outcome.   REHAB POTENTIAL: Good  CLINICAL DECISION MAKING: Evolving/moderate complexity  EVALUATION COMPLEXITY: Moderate   GOALS: Goals reviewed with patient? Yes  SHORT TERM GOALS: Target date: 03/21/2022   Patient will be independent with initial HEP to improve outcomes and carryover.  Baseline:  Goal status: IN PROGRESS  2.  Patient will improve R shoulder flexion and abduction to >90 w/o pain provocation.  Baseline:  Goal status: IN PROGRESS  3.  Patient to demonstrate improved upright posture with posterior shoulder girdle engaged to promote improved glenohumeral joint mobility. Baseline:  Goal status: IN PROGRESS   LONG TERM GOALS: Target date: 04/18/2022   Patient will be independent with ongoing/advanced HEP for self-management at home.   Baseline:  Goal status: IN PROGRESS  2.  Patient will report 75% improvement in R shoulder/UE pain to improve QOL.  Baseline: up to 9/10 at worst Goal status: IN PROGRESS  3.  Patient to improve R shoulder AROM to [facility] without pain provocation to allow for increased ease of ADLs.  Baseline:  Goal status: IN PROGRESS  4.  Patient will demonstrate improved R strength to >/= 4 to 4+/5 for functional UE use. Baseline:  Goal status: IN PROGRESS  5  Patient will report </= 33% on QuickDASH to demonstrate improved functional ability.  Baseline: 43.2 / 100 = 43.2% Goal status: IN PROGRESS   PLAN: PT FREQUENCY: 2x/week  PT DURATION: 8 weeks  PLANNED INTERVENTIONS: Therapeutic exercises, Therapeutic activity, Neuromuscular re-education, Patient/Family education, Self Care, Joint mobilization, Dry Needling, Electrical stimulation, Cryotherapy, Moist heat, scar mobilization, Vasopneumatic device, Ultrasound, Ionotophoresis '4mg'$ /ml Dexamethasone, Manual therapy, and Re-evaluation  PLAN FOR NEXT SESSION: Progress scapular stabilization/postural strengthening; work on pain free AAROM of L shoulder; MT +/- DN to address pain and abnormal muscle tension   Zaccary Creech L Neilani Duffee, PTA 02/28/2022, 11:09 AM

## 2022-03-08 ENCOUNTER — Ambulatory Visit: Payer: Medicare Other

## 2022-03-08 DIAGNOSIS — M25611 Stiffness of right shoulder, not elsewhere classified: Secondary | ICD-10-CM

## 2022-03-08 DIAGNOSIS — M6281 Muscle weakness (generalized): Secondary | ICD-10-CM

## 2022-03-08 DIAGNOSIS — R252 Cramp and spasm: Secondary | ICD-10-CM

## 2022-03-08 DIAGNOSIS — G8929 Other chronic pain: Secondary | ICD-10-CM | POA: Diagnosis not present

## 2022-03-08 DIAGNOSIS — R293 Abnormal posture: Secondary | ICD-10-CM | POA: Diagnosis not present

## 2022-03-08 DIAGNOSIS — R600 Localized edema: Secondary | ICD-10-CM

## 2022-03-08 DIAGNOSIS — Z9889 Other specified postprocedural states: Secondary | ICD-10-CM | POA: Diagnosis not present

## 2022-03-08 DIAGNOSIS — M25511 Pain in right shoulder: Secondary | ICD-10-CM | POA: Diagnosis not present

## 2022-03-08 NOTE — Therapy (Signed)
OUTPATIENT PHYSICAL THERAPY TREATMENT   Patient Name: CHIDERA THIVIERGE MRN: [id number] DOB:1951/01/08, 71 y.o., male Today's Date: 03/08/2022    PT End of Session - 03/08/22 1018     Visit Number 3    Authorization Type BCBS Medicare    PT Start Time 0932    PT Stop Time 4970    PT Time Calculation (min) 42 min    Activity Tolerance Patient tolerated treatment well;Patient limited by pain    Behavior During Therapy [facility] for tasks assessed/performed               Past Medical History:  Diagnosis Date   Arthritis    BBB (bundle branch block) 03/15/2016   Carpal tunnel syndrome    DDD (degenerative disc disease), lumbar 03/15/2016   Depression    Dysrhythmia    had some tachycardia with steroids   Elevated cholesterol 03/15/2016   Headache(784.0)    Ocular Migraines - takes Ambien for it   Heart murmur    HTN (hypertension) 03/15/2016   Hyperlipemia    Hypertension    Malignant melanoma of right side of neck ([place]) 03/15/2016   30 years ago    Neuralgia 03/15/2016   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    RLS (restless legs syndrome)    Sleep apnea    could not use a cpap   Snores    Status post gastric banding    Wears glasses    driving   Past Surgical History:  Procedure Laterality Date   ANTERIOR CERVICAL DECOMPRESSION/DISCECTOMY FUSION 4 LEVELS N/A 02/26/2020   Procedure: ANTERIOR CERVICAL DECOMPRESSION/DISCECTOMY FUSION, INTERBODY PROSTHESIS, PLATE/SCREWS CERVICAL THREE-FOUR CERVICAL FOUR-FIVE CERVICAL FIVE-SIX- CERVICAL SIX-SEVEN;  Surgeon: Newman Pies, MD;  Location: [place];  Service: Neurosurgery;  Laterality: N/A;   CARPAL TUNNEL RELEASE Right 09/26/2012   Procedure: CARPAL TUNNEL RELEASE;  Surgeon: Tennis Must, MD;  Location: [place];  Service: Orthopedics;  Laterality: Right;   CARPAL TUNNEL RELEASE Left 10/24/2012   Procedure: CARPAL TUNNEL RELEASE;   Surgeon: Tennis Must, MD;  Location: [place];  Service: Orthopedics;  Laterality: Left;   EXCISION MELANOMA WITH SENTINEL LYMPH NODE BIOPSY  1984   rt neck with mulpipal nodes and some muscle excised rt neck-shoulder   KNEE ARTHROPLASTY     knee replacement     LAPAROSCOPIC GASTRIC BANDING  03/15/2009   LAPAROSCOPIC GASTRIC BANDING     MUSCLE BIOPSY  2004   rt thigh to r/o myocitis   nerve conduction velosity test     SHOULDER SURGERY Right 12/2021   TONSILLECTOMY     TOTAL KNEE ARTHROPLASTY Bilateral 12/05/2010   TRIGGER FINGER RELEASE Right 09/26/2012   Procedure: RELEASE TRIGGER FINGER/A-1 PULLEY RING AND SMALL ;  Surgeon: Tennis Must, MD;  Location: [place];  Service: Orthopedics;  Laterality: Right;   TRIGGER FINGER RELEASE Left 10/24/2012   Procedure: RELEASE TRIGGER FINGER/A-1 PULLEY LEFT MIDDLE AND LEFT RING;  Surgeon: Tennis Must, MD;  Location: [place];  Service: Orthopedics;  Laterality: Left;   UPPER GI ENDOSCOPY     Patient Active Problem List   Diagnosis Date Noted   Cervical spondylosis with radiculopathy 02/26/2020   SBO (small bowel obstruction) ([place]) 08/21/2017   Trochanteric bursitis of left hip 01/18/2017   Lateral epicondylitis, right elbow 01/18/2017   Rheumatoid arthritis ([place]) 03/15/2016  DJD (degenerative joint disease), cervical 03/15/2016   DDD (degenerative disc disease), lumbar 03/15/2016   HTN (hypertension) 03/15/2016   Obesity 03/15/2016   Elevated cholesterol 03/15/2016   Neuralgia 03/15/2016   Malignant melanoma of right side of neck ([place]) 03/15/2016   BBB (bundle branch block) 03/15/2016   High risk medication use 03/15/2016   Osteoarthritis of lumbar spine 03/15/2016   Primary osteoarthritis of both hands 03/15/2016   Primary osteoarthritis of both knees 03/15/2016   Mesenteric adenitis 06/29/2015    PCP: Orpah Melter, MD   REFERRING PROVIDER: Mcarthur Rossetti,  MD  REFERRING DIAG: [phone] (ICD-10-CM) - Status post arthroscopy of right shoulder  THERAPY DIAG:  Chronic right shoulder pain  Stiffness of right shoulder, not elsewhere classified  Abnormal posture  Muscle weakness (generalized)  Cramp and spasm  Localized edema  RATIONALE FOR EVALUATION AND TREATMENT: Rehabilitation  ONSET DATE: ~12/08/21 - R shoulder arthroscopy with debridement and subacromial decompression  NEXT MD VISIT: 03/22/2022   SUBJECTIVE:                                                                                                                                                                                      SUBJECTIVE STATEMENT: Pt reports that it's been rough, he is unable to do anything he normally does with his R shoulder, he feels something else is wrong.  PAIN: Are you having pain? Yes: NPRS scale: 7/10 Pain location: R shoulder and biceps Pain description: super-sore/bruised/strained; point tender to touch Aggravating factors: twisting movements of arm (pronation/supination), raising arm overhead or reaching back, shoulder rotation Relieving factors: 4 ibuprofen and robaxin, heat, ice relieves pain but makes the shoulder stiff  PERTINENT HISTORY: R shoulder scope 11/2021; recent R olecranon bursitis and R UE cellulitis since R shoulder scope; ACDF 02/26/20; R radical neck dissection 2 melanoma 1984; B CTR 2014; B TKA 12/05/10; OA - lumbar spine, B hands, B knees; cervical DJD; lumbar DDD; RA; HTN; BBB; neuralgia; obesity  PRECAUTIONS: None  WEIGHT BEARING RESTRICTIONS: No  FALLS:  Has patient fallen in last 6 months? Yes. Number of falls 1 - landing on R shoulder  LIVING ENVIRONMENT: Lives with: lives with their spouse Lives in: House/apartment Stairs: Yes: Internal: 15 steps; on left going up and External: 3 steps; none Has following equipment at home: None  OCCUPATION: Retired  PLOF: Independent and Leisure: work around [employer], yard  work, Development worker, international aid, remodeling  PATIENT GOALS: "Pain-free shoulder."   OBJECTIVE:   DIAGNOSTIC FINDINGS:  01/04/22 - R shoulder x-ray:  FINDINGS: There is no evidence of fracture or dislocation. Mild degenerative changes are present at the acromioclavicular and glenohumeral joints.  Soft tissues are unremarkable. IMPRESSION: No acute osseous abnormality. 11/12/20 - R shoulder x-ray: No glenohumeral joint space narrowing was noted.  Acromioclavicular narrowing was noted.  No chondrocalcinosis was noted.  Impression: These findings is consistent with acromioclavicular arthritis.  PATIENT SURVEYS:  Quick Dash 43.2 / 100 = 43.2%  COGNITION: Overall cognitive status: Within functional limits for tasks assessed     SENSATION: WFL  POSTURE: rounded shoulders, forward head, and depressed/protracted R shoulder s/p radical neck dissection 2 melanoma  UPPER EXTREMITY ROM:   Active ROM Right * Eval seated Left Eval seated  Shoulder flexion 95 158  Shoulder extension 40 51  Shoulder abduction 78 171  Shoulder adduction    Shoulder internal rotation FIR to L3 FIR to T12  Shoulder external rotation Unable to FER FER to C6  Elbow flexion    Elbow extension    Wrist flexion    Wrist extension    Wrist ulnar deviation    Wrist radial deviation    Wrist pronation    Wrist supination     * Pain with all R shoulder AROM as well as pronation/supination  Passive ROM Right * Eval supine  Shoulder flexion 151  Shoulder extension   Shoulder abduction 143  Shoulder adduction   Shoulder internal rotation 69  at 45 ABD  Shoulder external rotation 80  at 45 ABD  (Blank rows = not tested) * Pain at end ROM  UPPER EXTREMITY MMT:  MMT Right * eval Left eval  Shoulder flexion 3+ 5  Shoulder extension 4 5  Shoulder abduction 3- 5  Shoulder adduction    Shoulder internal rotation 4 5  Shoulder external rotation 3- 5  Middle trapezius    Lower trapezius    Elbow flexion     Elbow extension    Wrist flexion    Wrist extension    Wrist ulnar deviation    Wrist radial deviation    Wrist pronation    Wrist supination    Grip strength (lbs)    (Blank rows = not tested) * pain with all resisted motions  SHOULDER SPECIAL TESTS: Impingement tests: Neer impingement test: positive   PALPATION:  TTP t/o R shoulder complex, esp pecs, deltoids, teres group, biceps   TODAY'S TREATMENT:  03/08/22 Manual Therapy: STM to R ant deltoid, proximal biceps with TPR PROM to R shoulder  Education on shoulder anatomy, importance of rest and using ice to reduce inflammation  02/28/22 TherEx: Pulleys 3 min flex/3 min abduction Supine R shoulder flexion/abd/ER all with wand x 10  S/L R shoulder flexion x 10 S/L R shoulder abduction x 10  S/L ER - painful Standing row red TB x 10 Standing shld extension red TB x 10   02/21/22 THERAPEUTIC EXERCISE: to improve flexibility, strength and mobility.  Verbal and tactile cues throughout for technique.  Supine wand AAROM R shoulder flexion x 5 - cues/facilitation for scap retraction/depression to reduce pain/catching sensation   PATIENT EDUCATION: Education details: PT eval findings and anticipated POC Person educated: Patient Education method: Explanation Education comprehension: verbalized understanding   HOME EXERCISE PROGRAM: Access Code: [email or web address] URL: [email or web address] Date: 02/28/2022 Prepared by: Clarene Essex  Exercises - Sidelying Shoulder Flexion 15 Degrees  - 1 x daily - 7 x weekly - 3 sets - 10 reps - Sidelying Shoulder Abduction Palm Forward  - 1 x daily - 7 x weekly - 3 sets - 10 reps - Seated Shoulder External Rotation AAROM with Cane and Hand in Neutral  -  1 x daily - 7 x weekly - 3 sets - 10 reps - Standing Bilateral Low Shoulder Row with Anchored Resistance  - 1 x daily - 7 x weekly - 3 sets - 10 reps - Shoulder Extension with Resistance  - 1 x daily - 7 x weekly - 3 sets - 10  reps   ASSESSMENT:  CLINICAL IMPRESSION: Pt arrived feeling a regression of progress. He reported that he has been unable to do any of his usual activities lately. Today he was hardly able to raise his R shoulder. He had multiple points of tenderness along the anterior shoulder. The STM seemed to help however he still was very limited with shoulder AROM. Provided education to rest shoulder avoid OH movements which seem to aggravate his shoulder and use ice to calm down inflammation. He opted to ice at home after session.  OBJECTIVE IMPAIRMENTS decreased ROM, decreased strength, increased edema, increased fascial restrictions, impaired perceived functional ability, increased muscle spasms, impaired flexibility, impaired UE functional use, improper body mechanics, postural dysfunction, and pain.   ACTIVITY LIMITATIONS carrying, lifting, sleeping, bed mobility, bathing, dressing, reach over head, hygiene/grooming, and caring for others  PARTICIPATION LIMITATIONS: meal prep, cleaning, laundry, driving, and yard work  PERSONAL FACTORS Age, Past/current experiences, Time since onset of injury/illness/exacerbation, and 3+ comorbidities: recent R olecranon bursitis and R UE cellulitis since R shoulder scope; ACDF 02/26/20; R radical neck dissection 2 melanoma 1984; B CTR 2014; B TKA 12/05/10; OA - lumbar spine, B hands, B knees; cervical DJD; lumbar DDD; RA; HTN; BBB; neuralgia; obesity  are also affecting patient's functional outcome.   REHAB POTENTIAL: Good  CLINICAL DECISION MAKING: Evolving/moderate complexity  EVALUATION COMPLEXITY: Moderate   GOALS: Goals reviewed with patient? Yes  SHORT TERM GOALS: Target date: 03/21/2022   Patient will be independent with initial HEP to improve outcomes and carryover.  Baseline:  Goal status: IN PROGRESS  2.  Patient will improve R shoulder flexion and abduction to >90 w/o pain provocation.  Baseline:  Goal status: IN PROGRESS  3.  Patient to  demonstrate improved upright posture with posterior shoulder girdle engaged to promote improved glenohumeral joint mobility. Baseline:  Goal status: IN PROGRESS   LONG TERM GOALS: Target date: 04/18/2022   Patient will be independent with ongoing/advanced HEP for self-management at home.  Baseline:  Goal status: IN PROGRESS  2.  Patient will report 75% improvement in R shoulder/UE pain to improve QOL.  Baseline: up to 9/10 at worst Goal status: IN PROGRESS  3.  Patient to improve R shoulder AROM to [facility] without pain provocation to allow for increased ease of ADLs.  Baseline:  Goal status: IN PROGRESS  4.  Patient will demonstrate improved R strength to >/= 4 to 4+/5 for functional UE use. Baseline:  Goal status: IN PROGRESS  5  Patient will report </= 33% on QuickDASH to demonstrate improved functional ability.  Baseline: 43.2 / 100 = 43.2% Goal status: IN PROGRESS   PLAN: PT FREQUENCY: 2x/week  PT DURATION: 8 weeks  PLANNED INTERVENTIONS: Therapeutic exercises, Therapeutic activity, Neuromuscular re-education, Patient/Family education, Self Care, Joint mobilization, Dry Needling, Electrical stimulation, Cryotherapy, Moist heat, scar mobilization, Vasopneumatic device, Ultrasound, Ionotophoresis '4mg'$ /ml Dexamethasone, Manual therapy, and Re-evaluation  PLAN FOR NEXT SESSION: Progress scapular stabilization/postural strengthening; work on pain free AAROM of L shoulder; MT +/- DN to address pain and abnormal muscle tension   Judia Arnott L Lani Mendiola, PTA 03/08/2022, 11:01 AM

## 2022-03-10 ENCOUNTER — Telehealth: Payer: Self-pay | Admitting: Orthopaedic Surgery

## 2022-03-10 ENCOUNTER — Other Ambulatory Visit: Payer: Self-pay | Admitting: Orthopaedic Surgery

## 2022-03-10 ENCOUNTER — Encounter: Payer: Self-pay | Admitting: Orthopaedic Surgery

## 2022-03-10 MED ORDER — HYDROCODONE-ACETAMINOPHEN 5-325 MG PO TABS: 1.0000 | ORAL_TABLET | Freq: Three times a day (TID) | ORAL | 0 refills | Status: DC | PRN

## 2022-03-10 MED ORDER — TIZANIDINE HCL 4 MG PO TABS: 4.0000 mg | ORAL_TABLET | Freq: Three times a day (TID) | ORAL | 1 refills | Status: DC | PRN

## 2022-03-10 MED ORDER — CELECOXIB 200 MG PO CAPS: 200.0000 mg | ORAL_CAPSULE | Freq: Two times a day (BID) | ORAL | 1 refills | Status: DC | PRN

## 2022-03-10 NOTE — Telephone Encounter (Signed)
Pt's wife Manuela Schwartz called requesting pain medication. Please send to pharmacy on file. Wife states pt need for physical therapy. Manuela Schwartz is asking for a call back at [phone].

## 2022-03-14 ENCOUNTER — Ambulatory Visit: Payer: Medicare Other

## 2022-03-14 DIAGNOSIS — R293 Abnormal posture: Secondary | ICD-10-CM

## 2022-03-14 DIAGNOSIS — M6281 Muscle weakness (generalized): Secondary | ICD-10-CM | POA: Diagnosis not present

## 2022-03-14 DIAGNOSIS — R252 Cramp and spasm: Secondary | ICD-10-CM

## 2022-03-14 DIAGNOSIS — M25611 Stiffness of right shoulder, not elsewhere classified: Secondary | ICD-10-CM | POA: Diagnosis not present

## 2022-03-14 DIAGNOSIS — M25511 Pain in right shoulder: Secondary | ICD-10-CM | POA: Diagnosis not present

## 2022-03-14 DIAGNOSIS — G8929 Other chronic pain: Secondary | ICD-10-CM

## 2022-03-14 DIAGNOSIS — Z9889 Other specified postprocedural states: Secondary | ICD-10-CM | POA: Diagnosis not present

## 2022-03-14 DIAGNOSIS — R600 Localized edema: Secondary | ICD-10-CM | POA: Diagnosis not present

## 2022-03-14 NOTE — Therapy (Signed)
OUTPATIENT PHYSICAL THERAPY TREATMENT   Patient Name: David [name] MRN: [id number] DOB:08-Sep-1950, 71 y.o., male Today's Date: 03/14/2022    PT End of Session - 03/14/22 1026     Visit Number 4    Authorization Type BCBS Medicare    PT Start Time 1157    PT Stop Time 2620    PT Time Calculation (min) 43 min    Activity Tolerance Patient tolerated treatment well    Behavior During Therapy [facility] for tasks assessed/performed                Past Medical History:  Diagnosis Date   Arthritis    BBB (bundle branch block) 03/15/2016   Carpal tunnel syndrome    DDD (degenerative disc disease), lumbar 03/15/2016   Depression    Dysrhythmia    had some tachycardia with steroids   Elevated cholesterol 03/15/2016   Headache(784.0)    Ocular Migraines - takes Ambien for it   Heart murmur    HTN (hypertension) 03/15/2016   Hyperlipemia    Hypertension    Malignant melanoma of right side of neck ([place]) 03/15/2016   30 years ago    Neuralgia 03/15/2016   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    RLS (restless legs syndrome)    Sleep apnea    could not use a cpap   Snores    Status post gastric banding    Wears glasses    driving   Past Surgical History:  Procedure Laterality Date   ANTERIOR CERVICAL DECOMPRESSION/DISCECTOMY FUSION 4 LEVELS N/A 02/26/2020   Procedure: ANTERIOR CERVICAL DECOMPRESSION/DISCECTOMY FUSION, INTERBODY PROSTHESIS, PLATE/SCREWS CERVICAL THREE-FOUR CERVICAL FOUR-FIVE CERVICAL FIVE-SIX- CERVICAL SIX-SEVEN;  Surgeon: Newman Pies, MD;  Location: [place];  Service: Neurosurgery;  Laterality: N/A;   CARPAL TUNNEL RELEASE Right 09/26/2012   Procedure: CARPAL TUNNEL RELEASE;  Surgeon: Tennis Must, MD;  Location: [place];  Service: Orthopedics;  Laterality: Right;   CARPAL TUNNEL RELEASE Left 10/24/2012   Procedure: CARPAL TUNNEL RELEASE;  Surgeon: Tennis Must,  MD;  Location: [place];  Service: Orthopedics;  Laterality: Left;   EXCISION MELANOMA WITH SENTINEL LYMPH NODE BIOPSY  1984   rt neck with mulpipal nodes and some muscle excised rt neck-shoulder   KNEE ARTHROPLASTY     knee replacement     LAPAROSCOPIC GASTRIC BANDING  03/15/2009   LAPAROSCOPIC GASTRIC BANDING     MUSCLE BIOPSY  2004   rt thigh to r/o myocitis   nerve conduction velosity test     SHOULDER SURGERY Right 12/2021   TONSILLECTOMY     TOTAL KNEE ARTHROPLASTY Bilateral 12/05/2010   TRIGGER FINGER RELEASE Right 09/26/2012   Procedure: RELEASE TRIGGER FINGER/A-1 PULLEY RING AND SMALL ;  Surgeon: Tennis Must, MD;  Location: [place];  Service: Orthopedics;  Laterality: Right;   TRIGGER FINGER RELEASE Left 10/24/2012   Procedure: RELEASE TRIGGER FINGER/A-1 PULLEY LEFT MIDDLE AND LEFT RING;  Surgeon: Tennis Must, MD;  Location: [place];  Service: Orthopedics;  Laterality: Left;   UPPER GI ENDOSCOPY     Patient Active Problem List   Diagnosis Date Noted   Cervical spondylosis with radiculopathy 02/26/2020   SBO (small bowel obstruction) ([place]) 08/21/2017   Trochanteric bursitis of left hip 01/18/2017   Lateral epicondylitis, right elbow 01/18/2017   Rheumatoid arthritis ([place]) 03/15/2016   DJD (degenerative  joint disease), cervical 03/15/2016   DDD (degenerative disc disease), lumbar 03/15/2016   HTN (hypertension) 03/15/2016   Obesity 03/15/2016   Elevated cholesterol 03/15/2016   Neuralgia 03/15/2016   Malignant melanoma of right side of neck ([place]) 03/15/2016   BBB (bundle branch block) 03/15/2016   High risk medication use 03/15/2016   Osteoarthritis of lumbar spine 03/15/2016   Primary osteoarthritis of both hands 03/15/2016   Primary osteoarthritis of both knees 03/15/2016   Mesenteric adenitis 06/29/2015    PCP: Orpah Melter, MD   REFERRING PROVIDER: Mcarthur Rossetti, MD  REFERRING DIAG: [phone]  (ICD-10-CM) - Status post arthroscopy of right shoulder  THERAPY DIAG:  No diagnosis found.  RATIONALE FOR EVALUATION AND TREATMENT: Rehabilitation  ONSET DATE: ~12/08/21 - R shoulder arthroscopy with debridement and subacromial decompression  NEXT MD VISIT: 03/22/2022   SUBJECTIVE:                                                                                                                                                                                      SUBJECTIVE STATEMENT: Pt reports he has been doing better, Dr. Ninfa Linden has prescribed Celebrex, Vicodin, and Zanaflex and patient feels that these meds have been helping his pain.  PAIN: Are you having pain? Yes: NPRS scale: 5/10 Pain location: R shoulder and biceps Pain description: super-sore/bruised/strained; point tender to touch Aggravating factors: twisting movements of arm (pronation/supination), raising arm overhead or reaching back, shoulder rotation Relieving factors: 4 ibuprofen and robaxin, heat, ice relieves pain but makes the shoulder stiff  PERTINENT HISTORY: R shoulder scope 11/2021; recent R olecranon bursitis and R UE cellulitis since R shoulder scope; ACDF 02/26/20; R radical neck dissection 2 melanoma 1984; B CTR 2014; B TKA 12/05/10; OA - lumbar spine, B hands, B knees; cervical DJD; lumbar DDD; RA; HTN; BBB; neuralgia; obesity  PRECAUTIONS: None  WEIGHT BEARING RESTRICTIONS: No  FALLS:  Has patient fallen in last 6 months? Yes. Number of falls 1 - landing on R shoulder  LIVING ENVIRONMENT: Lives with: lives with their spouse Lives in: House/apartment Stairs: Yes: Internal: 15 steps; on left going up and External: 3 steps; none Has following equipment at home: None  OCCUPATION: Retired  PLOF: Independent and Leisure: work around [employer], yard work, Development worker, international aid, remodeling  PATIENT GOALS: "Pain-free shoulder."   OBJECTIVE:   DIAGNOSTIC FINDINGS:  01/04/22 - R shoulder x-ray:   FINDINGS: There is no evidence of fracture or dislocation. Mild degenerative changes are present at the acromioclavicular and glenohumeral joints. Soft tissues are unremarkable. IMPRESSION: No acute osseous abnormality. 11/12/20 - R shoulder x-ray: No glenohumeral joint space narrowing was noted.  Acromioclavicular narrowing  was noted.  No chondrocalcinosis was noted.  Impression: These findings is consistent with acromioclavicular arthritis.  PATIENT SURVEYS:  Quick Dash 43.2 / 100 = 43.2%  COGNITION: Overall cognitive status: Within functional limits for tasks assessed     SENSATION: WFL  POSTURE: rounded shoulders, forward head, and depressed/protracted R shoulder s/p radical neck dissection 2 melanoma  UPPER EXTREMITY ROM:   Active ROM Right * Eval seated Left Eval seated Right 03/14/22  Shoulder flexion 95 158 115 - mild pain  Shoulder extension 40 51   Shoulder abduction 78 171 80 - mild pain  Shoulder adduction     Shoulder internal rotation FIR to L3 FIR to T12   Shoulder external rotation Unable to FER FER to C6   Elbow flexion     Elbow extension     Wrist flexion     Wrist extension     Wrist ulnar deviation     Wrist radial deviation     Wrist pronation     Wrist supination      * Pain with all R shoulder AROM as well as pronation/supination  Passive ROM Right * Eval supine  Shoulder flexion 151  Shoulder extension   Shoulder abduction 143  Shoulder adduction   Shoulder internal rotation 69  at 45 ABD  Shoulder external rotation 80  at 45 ABD  (Blank rows = not tested) * Pain at end ROM  UPPER EXTREMITY MMT:  MMT Right * eval Left eval  Shoulder flexion 3+ 5  Shoulder extension 4 5  Shoulder abduction 3- 5  Shoulder adduction    Shoulder internal rotation 4 5  Shoulder external rotation 3- 5  Middle trapezius    Lower trapezius    Elbow flexion    Elbow extension    Wrist flexion    Wrist extension    Wrist ulnar deviation     Wrist radial deviation    Wrist pronation    Wrist supination    Grip strength (lbs)    (Blank rows = not tested) * pain with all resisted motions  SHOULDER SPECIAL TESTS: Impingement tests: Neer impingement test: positive   PALPATION:  TTP t/o R shoulder complex, esp pecs, deltoids, teres group, biceps   TODAY'S TREATMENT:  03/14/22 THERAPEUTIC EXERCISE: to improve flexibility, strength and mobility.  Verbal and tactile cues throughout for technique.  Pulleys 3 min flexion , 3 min scaption Standing rows red TB x 20  Standing shoulder extension red TB x 10 Standing R shoulder ER/IR isometric step out x 10 red TB each Supine R shoulder flexion AROM x 10  Manual Therapy:  STM to R lateral deltoid, triceps 03/08/22 Manual Therapy: STM to R ant deltoid, proximal biceps with TPR PROM to R shoulder  Education on shoulder anatomy, importance of rest and using ice to reduce inflammation  02/28/22 TherEx: Pulleys 3 min flex/3 min abduction Supine R shoulder flexion/abd/ER all with wand x 10  S/L R shoulder flexion x 10 S/L R shoulder abduction x 10  S/L ER - painful Standing row red TB x 10 Standing shld extension red TB x 10   02/21/22 THERAPEUTIC EXERCISE: to improve flexibility, strength and mobility.  Verbal and tactile cues throughout for technique.  Supine wand AAROM R shoulder flexion x 5 - cues/facilitation for scap retraction/depression to reduce pain/catching sensation   PATIENT EDUCATION: Education details: PT eval findings and anticipated POC Person educated: Patient Education method: Explanation Education comprehension: verbalized understanding   HOME EXERCISE PROGRAM:  Access Code: [email or web address] URL: [email or web address] Date: 03/14/2022 Prepared by: Clarene Essex  Exercises - Sidelying Shoulder Flexion 15 Degrees  - 1 x daily - 7 x weekly - 3 sets - 10 reps - Sidelying Shoulder Abduction Palm Forward  - 1 x daily - 7 x weekly - 3 sets - 10  reps - Seated Shoulder External Rotation AAROM with Cane and Hand in Neutral  - 1 x daily - 7 x weekly - 3 sets - 10 reps - Standing Bilateral Low Shoulder Row with Anchored Resistance  - 1 x daily - 7 x weekly - 3 sets - 10 reps - Shoulder Extension with Resistance  - 1 x daily - 7 x weekly - 3 sets - 10 reps - Shoulder external rotation  - 1 x daily - 3-4 x weekly - 2 sets - 10 reps - Shoulder Internal Rotation Reactive Isometrics  - 1 x daily - 3-4 x weekly - 2 sets - 10 reps   ASSESSMENT:  CLINICAL IMPRESSION: Pt has recently gotten new pain meds and muscle relaxer's from MD which have been helping his pain. He was able to participate in ROM exercises today with only mild pain and also demonstrates improved R shoulder flexion and abduction upright. Worked on scap stability with no issues but provided cues for scap retraction with rows and ext. Pt is now showing progress, declined modalities post session.  OBJECTIVE IMPAIRMENTS decreased ROM, decreased strength, increased edema, increased fascial restrictions, impaired perceived functional ability, increased muscle spasms, impaired flexibility, impaired UE functional use, improper body mechanics, postural dysfunction, and pain.   ACTIVITY LIMITATIONS carrying, lifting, sleeping, bed mobility, bathing, dressing, reach over head, hygiene/grooming, and caring for others  PARTICIPATION LIMITATIONS: meal prep, cleaning, laundry, driving, and yard work  PERSONAL FACTORS Age, Past/current experiences, Time since onset of injury/illness/exacerbation, and 3+ comorbidities: recent R olecranon bursitis and R UE cellulitis since R shoulder scope; ACDF 02/26/20; R radical neck dissection 2 melanoma 1984; B CTR 2014; B TKA 12/05/10; OA - lumbar spine, B hands, B knees; cervical DJD; lumbar DDD; RA; HTN; BBB; neuralgia; obesity  are also affecting patient's functional outcome.   REHAB POTENTIAL: Good  CLINICAL DECISION MAKING: Evolving/moderate  complexity  EVALUATION COMPLEXITY: Moderate   GOALS: Goals reviewed with patient? Yes  SHORT TERM GOALS: Target date: 03/21/2022   Patient will be independent with initial HEP to improve outcomes and carryover.  Baseline:  Goal status: IN PROGRESS  2.  Patient will improve R shoulder flexion and abduction to >90 w/o pain provocation.  Baseline:  Goal status: IN PROGRESS - met for flexion 03/14/22  3.  Patient to demonstrate improved upright posture with posterior shoulder girdle engaged to promote improved glenohumeral joint mobility. Baseline:  Goal status: IN PROGRESS   LONG TERM GOALS: Target date: 04/18/2022   Patient will be independent with ongoing/advanced HEP for self-management at home.  Baseline:  Goal status: IN PROGRESS  2.  Patient will report 75% improvement in R shoulder/UE pain to improve QOL.  Baseline: up to 9/10 at worst Goal status: IN PROGRESS  3.  Patient to improve R shoulder AROM to [facility] without pain provocation to allow for increased ease of ADLs.  Baseline:  Goal status: IN PROGRESS  4.  Patient will demonstrate improved R strength to >/= 4 to 4+/5 for functional UE use. Baseline:  Goal status: IN PROGRESS  5  Patient will report </= 33% on QuickDASH to demonstrate improved functional ability.  Baseline: 43.2 / 100 =  43.2% Goal status: IN PROGRESS   PLAN: PT FREQUENCY: 2x/week  PT DURATION: 8 weeks  PLANNED INTERVENTIONS: Therapeutic exercises, Therapeutic activity, Neuromuscular re-education, Patient/Family education, Self Care, Joint mobilization, Dry Needling, Electrical stimulation, Cryotherapy, Moist heat, scar mobilization, Vasopneumatic device, Ultrasound, Ionotophoresis 56m/ml Dexamethasone, Manual therapy, and Re-evaluation  PLAN FOR NEXT SESSION: Progress scapular stabilization/postural strengthening; work on pain free AAROM of L shoulder; MT +/- DN to address pain and abnormal muscle tension   Niasha Devins L Javarri Segal,  PTA 03/14/2022, 10:27 AM

## 2022-03-17 ENCOUNTER — Ambulatory Visit: Payer: Medicare Other | Admitting: Physical Therapy

## 2022-03-21 ENCOUNTER — Encounter: Payer: Self-pay | Admitting: Physical Therapy

## 2022-03-21 ENCOUNTER — Ambulatory Visit: Payer: Medicare Other | Admitting: Physical Therapy

## 2022-03-21 DIAGNOSIS — G8929 Other chronic pain: Secondary | ICD-10-CM | POA: Diagnosis not present

## 2022-03-21 DIAGNOSIS — Z9889 Other specified postprocedural states: Secondary | ICD-10-CM | POA: Diagnosis not present

## 2022-03-21 DIAGNOSIS — R600 Localized edema: Secondary | ICD-10-CM

## 2022-03-21 DIAGNOSIS — R252 Cramp and spasm: Secondary | ICD-10-CM

## 2022-03-21 DIAGNOSIS — M25611 Stiffness of right shoulder, not elsewhere classified: Secondary | ICD-10-CM | POA: Diagnosis not present

## 2022-03-21 DIAGNOSIS — R293 Abnormal posture: Secondary | ICD-10-CM

## 2022-03-21 DIAGNOSIS — M6281 Muscle weakness (generalized): Secondary | ICD-10-CM | POA: Diagnosis not present

## 2022-03-21 DIAGNOSIS — M25511 Pain in right shoulder: Secondary | ICD-10-CM | POA: Diagnosis not present

## 2022-03-21 NOTE — Therapy (Signed)
OUTPATIENT PHYSICAL THERAPY TREATMENT / PROGRESS NOTE   Patient Name: David [name] MRN: [id number] DOB:08/25/1950, 71 y.o., male Today's Date: 03/21/2022  Progress Note  Reporting Period 02/21/2022 to 03/21/2022  See note below for Objective Data and Assessment of Progress/Goals.      PT End of Session - 03/21/22 0932     Visit Number 5    Date for PT Re-Evaluation 04/18/22    Authorization Type BC[employer] Medicare    PT Start Time 0932    PT Stop Time 1017    PT Time Calculation (min) 45 min    Activity Tolerance Patient tolerated treatment well    Behavior During Therapy [facility] for tasks assessed/performed                 Past Medical History:  Diagnosis Date   Arthritis    BBB (bundle branch block) 03/15/2016   Carpal tunnel syndrome    DDD (degenerative disc disease), lumbar 03/15/2016   Depression    Dysrhythmia    had some tachycardia with steroids   Elevated cholesterol 03/15/2016   Headache(784.0)    Ocular Migraines - takes Ambien for it   Heart murmur    HTN (hypertension) 03/15/2016   Hyperlipemia    Hypertension    Malignant melanoma of right side of neck ([place]) 03/15/2016   30 years ago    Neuralgia 03/15/2016   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    RLS (restless legs syndrome)    Sleep apnea    could not use a cpap   Snores    Status post gastric banding    Wears glasses    driving   Past Surgical History:  Procedure Laterality Date   ANTERIOR CERVICAL DECOMPRESSION/DISCECTOMY FUSION 4 LEVELS N/A 02/26/2020   Procedure: ANTERIOR CERVICAL DECOMPRESSION/DISCECTOMY FUSION, INTERBODY PROSTHESIS, PLATE/SCREWS CERVICAL THREE-FOUR CERVICAL FOUR-FIVE CERVICAL FIVE-SIX- CERVICAL SIX-SEVEN;  Surgeon: Newman Pies, MD;  Location: [place];  Service: Neurosurgery;  Laterality: N/A;   CARPAL TUNNEL RELEASE Right 09/26/2012   Procedure: CARPAL TUNNEL RELEASE;  Surgeon: Tennis Must, MD;  Location: [place];  Service: Orthopedics;  Laterality: Right;   CARPAL TUNNEL RELEASE Left 10/24/2012   Procedure: CARPAL TUNNEL RELEASE;  Surgeon: Tennis Must, MD;  Location: [place];  Service: Orthopedics;  Laterality: Left;   EXCISION MELANOMA WITH SENTINEL LYMPH NODE BIOPSY  1984   rt neck with mulpipal nodes and some muscle excised rt neck-shoulder   KNEE ARTHROPLASTY     knee replacement     LAPAROSCOPIC GASTRIC BANDING  03/15/2009   LAPAROSCOPIC GASTRIC BANDING     MUSCLE BIOPSY  2004   rt thigh to r/o myocitis   nerve conduction velosity test     SHOULDER SURGERY Right 12/2021   TONSILLECTOMY     TOTAL KNEE ARTHROPLASTY Bilateral 12/05/2010   TRIGGER FINGER RELEASE Right 09/26/2012   Procedure: RELEASE TRIGGER FINGER/A-1 PULLEY RING AND SMALL ;  Surgeon: Tennis Must, MD;  Location: [place];  Service: Orthopedics;  Laterality: Right;   TRIGGER FINGER RELEASE Left 10/24/2012   Procedure: RELEASE TRIGGER FINGER/A-1 PULLEY LEFT MIDDLE AND LEFT RING;  Surgeon: Tennis Must, MD;  Location: [place];  Service: Orthopedics;  Laterality: Left;   UPPER GI ENDOSCOPY     Patient Active Problem List   Diagnosis Date Noted   Cervical spondylosis with radiculopathy  02/26/2020   SBO (small bowel obstruction) ([place]) 08/21/2017   Trochanteric bursitis of left hip 01/18/2017   Lateral epicondylitis, right elbow 01/18/2017   Rheumatoid arthritis ([place]) 03/15/2016   DJD (degenerative joint disease), cervical 03/15/2016   DDD (degenerative disc disease), lumbar 03/15/2016   HTN (hypertension) 03/15/2016   Obesity 03/15/2016   Elevated cholesterol 03/15/2016   Neuralgia 03/15/2016   Malignant melanoma of right side of neck ([place]) 03/15/2016   BBB (bundle branch block) 03/15/2016   High risk medication use 03/15/2016   Osteoarthritis of lumbar spine 03/15/2016   Primary osteoarthritis of both hands 03/15/2016    Primary osteoarthritis of both knees 03/15/2016   Mesenteric adenitis 06/29/2015    PCP: Orpah Melter, MD   REFERRING PROVIDER: Mcarthur Rossetti, MD  REFERRING DIAG: [phone] (ICD-10-CM) - Status post arthroscopy of right shoulder  THERAPY DIAG:  Chronic right shoulder pain  Stiffness of right shoulder, not elsewhere classified  Abnormal posture  Muscle weakness (generalized)  Cramp and spasm  Localized edema  RATIONALE FOR EVALUATION AND TREATMENT: Rehabilitation  ONSET DATE: ~12/08/21 - R shoulder arthroscopy with debridement and subacromial decompression  NEXT MD VISIT: 03/22/2022   SUBJECTIVE:                                                                                                                                                                                      SUBJECTIVE STATEMENT: Pt reports the new meds, esp the Celebrex, has been helping with the pain - now just having exercise related soreness.  PAIN: Are you having pain? Yes: NPRS scale: 3/10 Pain location: R shoulder and biceps Pain description: sore   PERTINENT HISTORY: R shoulder scope 11/2021; recent R olecranon bursitis and R UE cellulitis since R shoulder scope; ACDF 02/26/20; R radical neck dissection 2 melanoma 1984; B CTR 2014; B TKA 12/05/10; OA - lumbar spine, B hands, B knees; cervical DJD; lumbar DDD; RA; HTN; BBB; neuralgia; obesity  PRECAUTIONS: None  WEIGHT BEARING RESTRICTIONS: No  FALLS:  Has patient fallen in last 6 months? Yes. Number of falls 1 - landing on R shoulder  LIVING ENVIRONMENT: Lives with: lives with their spouse Lives in: House/apartment Stairs: Yes: Internal: 15 steps; on left going up and External: 3 steps; none Has following equipment at home: None  OCCUPATION: Retired  PLOF: Independent and Leisure: work around [employer], yard work, Development worker, international aid, remodeling  PATIENT GOALS: "Pain-free shoulder."   OBJECTIVE:   DIAGNOSTIC FINDINGS:   01/04/22 - R shoulder x-ray:  FINDINGS: There is no evidence of fracture or dislocation. Mild degenerative changes are present at the acromioclavicular and glenohumeral joints. Soft tissues are unremarkable. IMPRESSION:  No acute osseous abnormality. 11/12/20 - R shoulder x-ray: No glenohumeral joint space narrowing was noted.  Acromioclavicular narrowing was noted.  No chondrocalcinosis was noted.  Impression: These findings is consistent with acromioclavicular arthritis.  PATIENT SURVEYS:  Quick Dash 43.2 / 100 = 43.2%  COGNITION: Overall cognitive status: Within functional limits for tasks assessed     SENSATION: WFL  POSTURE: rounded shoulders, forward head, and depressed/protracted R shoulder s/p radical neck dissection 2 melanoma  UPPER EXTREMITY ROM:   Active ROM Right * Eval seated Left Eval seated Right 03/14/22 Right  03/21/22  Shoulder flexion 95 158 115 - mild pain 125  Shoulder extension 40 51    Shoulder abduction 78 171 80 - mild pain 115  Shoulder adduction      Shoulder internal rotation FIR to L3 FIR to T12    Shoulder external rotation Unable to FER FER to C6  49 sitting  Elbow flexion      Elbow extension      Wrist flexion      Wrist extension      Wrist ulnar deviation      Wrist radial deviation      Wrist pronation      Wrist supination       * Pain with all R shoulder AROM as well as pronation/supination  Passive ROM Right * Eval supine  Shoulder flexion 151  Shoulder extension   Shoulder abduction 143  Shoulder adduction   Shoulder internal rotation 69  at 45 ABD  Shoulder external rotation 80  at 45 ABD  (Blank rows = not tested) * Pain at end ROM  UPPER EXTREMITY MMT:  MMT Right * eval Left eval Right  03/21/22  Shoulder flexion 3+ 5 4 **  Shoulder extension 4 5 4+  Shoulder abduction 3- 5 4 **  Shoulder adduction     Shoulder internal rotation 4 5 4+  Shoulder external rotation 3- 5 3+ *  Middle trapezius      Lower trapezius     Elbow flexion     Elbow extension     Wrist flexion     Wrist extension     Wrist ulnar deviation     Wrist radial deviation     Wrist pronation     Wrist supination     Grip strength (lbs)     (Blank rows = not tested) * pain with all resisted motions, ** pain in biceps  SHOULDER SPECIAL TESTS: Impingement tests: Neer impingement test: positive   PALPATION:  TTP t/o R shoulder complex, esp pecs, deltoids, teres group, biceps   TODAY'S TREATMENT:   03/21/22 THERAPEUTIC EXERCISE: to improve flexibility, strength and mobility.  Verbal and tactile cues throughout for technique. Pulleys: flexion and scaption/abduction x 3 min each Sidelying:   R shoulder ER 10 x 3" - VC & TC for scap retraction and depression    R shoulder ABD 0-90 x 10 - VC & TC for humeral head depression    R shoulder horiz ABD x 10   R shoulder rhythmic stabilization at 90 ABD - 2 x 30 sec- 1st set with perturbations at elbow, 2nd set at wrist Hooklying   R shoulder rhythmic stabilization at 90 flexion - 2 x 30 sec- 1st set with perturbations at elbow, 2nd set at wrist   R shoulder protraction 2# 2 x 10   R shoulder CW/CCW circles at 90 flexion x 10 each direction  MANUAL THERAPY: To promote normalized muscle  tension, improved flexibility, improved joint mobility, increased ROM, and reduced pain. STM and manual TPR to R middle deltoid Grade II-III R shoulder distraction, A/P, and inferior glides for pain relief and improved GH mobility   03/14/22 THERAPEUTIC EXERCISE: to improve flexibility, strength and mobility.  Verbal and tactile cues throughout for technique.  Pulleys 3 min flexion , 3 min scaption Standing rows red TB x 20  Standing shoulder extension red TB x 10 Standing R shoulder ER/IR isometric step out x 10 red TB each Supine R shoulder flexion AROM x 10  Manual Therapy:  STM to R lateral deltoid, triceps   03/08/22 Manual Therapy: STM to R ant deltoid, proximal  biceps with TPR PROM to R shoulder  Education on shoulder anatomy, importance of rest and using ice to reduce inflammation   PATIENT EDUCATION: Education details: PT eval findings and anticipated POC Person educated: Patient Education method: Explanation Education comprehension: verbalized understanding   HOME EXERCISE PROGRAM: Access Code: [email or web address] URL: [email or web address] Date: 03/14/2022 Prepared by: Clarene Essex  Exercises - Sidelying Shoulder Flexion 15 Degrees  - 1 x daily - 7 x weekly - 3 sets - 10 reps - Sidelying Shoulder Abduction Palm Forward  - 1 x daily - 7 x weekly - 3 sets - 10 reps - Seated Shoulder External Rotation AAROM with Cane and Hand in Neutral  - 1 x daily - 7 x weekly - 3 sets - 10 reps - Standing Bilateral Low Shoulder Row with Anchored Resistance  - 1 x daily - 7 x weekly - 3 sets - 10 reps - Shoulder Extension with Resistance  - 1 x daily - 7 x weekly - 3 sets - 10 reps - Shoulder external rotation  - 1 x daily - 3-4 x weekly - 2 sets - 10 reps - Shoulder Internal Rotation Reactive Isometrics  - 1 x daily - 3-4 x weekly - 2 sets - 10 reps   ASSESSMENT:  CLINICAL IMPRESSION: Ariyon reports the new pain meds, esp the Celebrex, have made a big difference in his pain, now mostly only experiencing exercise-related soreness - continued to caution pt to avoid pushing exercises into painful extremes. R shoulder ROM continues to improve, nearing his baseline due to the limitations following his radical neck dissection. He continues to demonstrate limited scapular control and stabilization which contributes to his increased pain with some motions - less pain noted with with VC and TC to facilitate scapular engagement. Continued weakness still evident in L shoulder with pain on most resisted motions. Zoran has now met all of his STGs and in progressing toward his LTGs. He will continue to benefit from skilled PT to improve glenohumeral control for  improved functional R shoulder ROM and strength.   OBJECTIVE IMPAIRMENTS decreased ROM, decreased strength, increased edema, increased fascial restrictions, impaired perceived functional ability, increased muscle spasms, impaired flexibility, impaired UE functional use, improper body mechanics, postural dysfunction, and pain.   ACTIVITY LIMITATIONS carrying, lifting, sleeping, bed mobility, bathing, dressing, reach over head, hygiene/grooming, and caring for others  PARTICIPATION LIMITATIONS: meal prep, cleaning, laundry, driving, and yard work  PERSONAL FACTORS Age, Past/current experiences, Time since onset of injury/illness/exacerbation, and 3+ comorbidities: recent R olecranon bursitis and R UE cellulitis since R shoulder scope; ACDF 02/26/20; R radical neck dissection 2 melanoma 1984; B CTR 2014; B TKA 12/05/10; OA - lumbar spine, B hands, B knees; cervical DJD; lumbar DDD; RA; HTN; BBB; neuralgia; obesity  are also affecting patient's functional outcome.  REHAB POTENTIAL: Good  CLINICAL DECISION MAKING: Evolving/moderate complexity  EVALUATION COMPLEXITY: Moderate   GOALS: Goals reviewed with patient? Yes  SHORT TERM GOALS: Target date: 03/21/2022   Patient will be independent with initial HEP to improve outcomes and carryover.  Baseline:  Goal status: MET - 03/21/22  2.  Patient will improve R shoulder flexion and abduction to >90 w/o pain provocation.  Baseline:  Goal status: MET - 03/21/22  3.  Patient to demonstrate improved upright posture with posterior shoulder girdle engaged to promote improved glenohumeral joint mobility. Baseline:  Goal status: MET - 03/21/22   LONG TERM GOALS: Target date: 04/18/2022   Patient will be independent with ongoing/advanced HEP for self-management at home.  Baseline:  Goal status: IN PROGRESS  2.  Patient will report 75% improvement in R shoulder/UE pain to improve QOL.  Baseline: up to 9/10 at worst Goal status: IN PROGRESS   03/21/22 - 30% reduction in pain  3.  Patient to improve R shoulder AROM to [facility] without pain provocation to allow for increased ease of ADLs.  Baseline:  Goal status: IN PROGRESS  4.  Patient will demonstrate improved R strength to >/= 4 to 4+/5 for functional UE use. Baseline:  Goal status: IN PROGRESS  5  Patient will report </= 33% on QuickDASH to demonstrate improved functional ability.  Baseline: 43.2 / 100 = 43.2% Goal status: IN PROGRESS   PLAN: PT FREQUENCY: 2x/week  PT DURATION: 8 weeks  PLANNED INTERVENTIONS: Therapeutic exercises, Therapeutic activity, Neuromuscular re-education, Patient/Family education, Self Care, Joint mobilization, Dry Needling, Electrical stimulation, Cryotherapy, Moist heat, scar mobilization, Vasopneumatic device, Ultrasound, Ionotophoresis 44m/ml Dexamethasone, Manual therapy, and Re-evaluation  PLAN FOR NEXT SESSION: Try UBE for warm-up?; Progress scapular stabilization/postural strengthening; work on pain free AAAROM of L shoulder & RTC strengthening; MT +/- DN to address pain and abnormal muscle tension   JPercival Spanish PT 03/21/2022, 12:06 PM

## 2022-03-22 ENCOUNTER — Encounter: Payer: Self-pay | Admitting: Orthopaedic Surgery

## 2022-03-22 ENCOUNTER — Ambulatory Visit: Payer: Medicare Other | Admitting: Orthopaedic Surgery

## 2022-03-22 DIAGNOSIS — G8929 Other chronic pain: Secondary | ICD-10-CM

## 2022-03-22 DIAGNOSIS — M25511 Pain in right shoulder: Secondary | ICD-10-CM | POA: Diagnosis not present

## 2022-03-22 DIAGNOSIS — Z9889 Other specified postprocedural states: Secondary | ICD-10-CM | POA: Diagnosis not present

## 2022-03-22 MED ORDER — METHYLPREDNISOLONE ACETATE 40 MG/ML IJ SUSP
40.0000 mg | INTRAMUSCULAR | Status: AC | PRN
  Administered 2022-03-22: 40 mg via INTRA_ARTICULAR

## 2022-03-22 MED ORDER — LIDOCAINE HCL 1 % IJ SOLN
3.0000 mL | INTRAMUSCULAR | Status: AC | PRN
  Administered 2022-03-22: 3 mL

## 2022-03-22 NOTE — Progress Notes (Signed)
The patient is almost 4 months out now from a right shoulder arthroscopy with extensive debridement and subacromial decompression.  His postoperative course was slowed down by severe olecranon bursitis.  He is in therapy now.  He also had mechanical fall which caused him to have worsening shoulder pain.  He is on Celebrex and that is helped.  His right shoulder does have pain throughout the arc of motion of the shoulder but he is moving better from what it was at his last visit and it looks like he is making good progress.  Physical therapy has extended him perform to 6 more weeks and I agree with this.  I did recommend a steroid injection in his right shoulder subacromial outlet and he agreed to this and tolerated it well.  We will see him back in 4 weeks after course of physical therapy continues for his shoulder.  If he still has significant weakness in the shoulder we may consider repeat MRI of the right shoulder given his fall.  We will see what he looks like in 4 weeks.  All question concerns were answered and addressed.       Procedure Note  Patient: David [name]             Date of Birth: 05/16/1951           MRN: [id number]             Visit Date: 03/22/2022  Procedures: Visit Diagnoses:  1. Chronic right shoulder pain   2. Status post arthroscopy of right shoulder     Large Joint Inj: R subacromial bursa on 03/22/2022 9:23 AM Indications: pain and diagnostic evaluation Details: 22 G 1.5 in needle  Arthrogram: No  Medications: 3 mL lidocaine 1 %; 40 mg methylPREDNISolone acetate 40 MG/ML Outcome: tolerated well, no immediate complications Procedure, treatment alternatives, risks and benefits explained, specific risks discussed. Consent was given by the patient. Immediately prior to procedure a time out was called to verify the correct patient, procedure, equipment, support staff and site/side marked as required. Patient was prepped and draped in the usual sterile fashion.

## 2022-03-23 ENCOUNTER — Ambulatory Visit: Payer: Medicare Other | Admitting: Physical Therapy

## 2022-03-27 ENCOUNTER — Ambulatory Visit: Payer: Medicare Other | Attending: Orthopaedic Surgery

## 2022-03-27 DIAGNOSIS — M6281 Muscle weakness (generalized): Secondary | ICD-10-CM | POA: Insufficient documentation

## 2022-03-27 DIAGNOSIS — M25511 Pain in right shoulder: Secondary | ICD-10-CM | POA: Insufficient documentation

## 2022-03-27 DIAGNOSIS — R600 Localized edema: Secondary | ICD-10-CM | POA: Insufficient documentation

## 2022-03-27 DIAGNOSIS — G8929 Other chronic pain: Secondary | ICD-10-CM | POA: Diagnosis not present

## 2022-03-27 DIAGNOSIS — R293 Abnormal posture: Secondary | ICD-10-CM | POA: Insufficient documentation

## 2022-03-27 DIAGNOSIS — M25611 Stiffness of right shoulder, not elsewhere classified: Secondary | ICD-10-CM | POA: Insufficient documentation

## 2022-03-27 DIAGNOSIS — R252 Cramp and spasm: Secondary | ICD-10-CM | POA: Insufficient documentation

## 2022-03-27 NOTE — Therapy (Signed)
OUTPATIENT PHYSICAL THERAPY TREATMENT / PROGRESS NOTE   Patient Name: David [name] MRN: [id number] DOB:03-11-1951, 71 y.o., male Today's Date: 03/27/2022       PT End of Session - 03/27/22 1024     Visit Number 6    Date for PT Re-Evaluation 04/18/22    Authorization Type BCBS Medicare    PT Start Time 0932    PT Stop Time 1019    PT Time Calculation (min) 47 min    Activity Tolerance Patient tolerated treatment well    Behavior During Therapy [facility] for tasks assessed/performed                 Past Medical History:  Diagnosis Date   Arthritis    BBB (bundle branch block) 03/15/2016   Carpal tunnel syndrome    DDD (degenerative disc disease), lumbar 03/15/2016   Depression    Dysrhythmia    had some tachycardia with steroids   Elevated cholesterol 03/15/2016   Headache(784.0)    Ocular Migraines - takes Ambien for it   Heart murmur    HTN (hypertension) 03/15/2016   Hyperlipemia    Hypertension    Malignant melanoma of right side of neck ([place]) 03/15/2016   30 years ago    Neuralgia 03/15/2016   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    RLS (restless legs syndrome)    Sleep apnea    could not use a cpap   Snores    Status post gastric banding    Wears glasses    driving   Past Surgical History:  Procedure Laterality Date   ANTERIOR CERVICAL DECOMPRESSION/DISCECTOMY FUSION 4 LEVELS N/A 02/26/2020   Procedure: ANTERIOR CERVICAL DECOMPRESSION/DISCECTOMY FUSION, INTERBODY PROSTHESIS, PLATE/SCREWS CERVICAL THREE-FOUR CERVICAL FOUR-FIVE CERVICAL FIVE-SIX- CERVICAL SIX-SEVEN;  Surgeon: Newman Pies, MD;  Location: [place];  Service: Neurosurgery;  Laterality: N/A;   CARPAL TUNNEL RELEASE Right 09/26/2012   Procedure: CARPAL TUNNEL RELEASE;  Surgeon: Tennis Must, MD;  Location: [place];  Service: Orthopedics;  Laterality: Right;   CARPAL TUNNEL RELEASE Left 10/24/2012    Procedure: CARPAL TUNNEL RELEASE;  Surgeon: Tennis Must, MD;  Location: [place];  Service: Orthopedics;  Laterality: Left;   EXCISION MELANOMA WITH SENTINEL LYMPH NODE BIOPSY  1984   rt neck with mulpipal nodes and some muscle excised rt neck-shoulder   KNEE ARTHROPLASTY     knee replacement     LAPAROSCOPIC GASTRIC BANDING  03/15/2009   LAPAROSCOPIC GASTRIC BANDING     MUSCLE BIOPSY  2004   rt thigh to r/o myocitis   nerve conduction velosity test     SHOULDER SURGERY Right 12/2021   TONSILLECTOMY     TOTAL KNEE ARTHROPLASTY Bilateral 12/05/2010   TRIGGER FINGER RELEASE Right 09/26/2012   Procedure: RELEASE TRIGGER FINGER/A-1 PULLEY RING AND SMALL ;  Surgeon: Tennis Must, MD;  Location: [place];  Service: Orthopedics;  Laterality: Right;   TRIGGER FINGER RELEASE Left 10/24/2012   Procedure: RELEASE TRIGGER FINGER/A-1 PULLEY LEFT MIDDLE AND LEFT RING;  Surgeon: Tennis Must, MD;  Location: [place];  Service: Orthopedics;  Laterality: Left;   UPPER GI ENDOSCOPY     Patient Active Problem List   Diagnosis Date Noted   Cervical spondylosis with radiculopathy 02/26/2020   SBO (small bowel obstruction) ([place]) 08/21/2017   Trochanteric bursitis of left hip 01/18/2017  Lateral epicondylitis, right elbow 01/18/2017   Rheumatoid arthritis ([place]) 03/15/2016   DJD (degenerative joint disease), cervical 03/15/2016   DDD (degenerative disc disease), lumbar 03/15/2016   HTN (hypertension) 03/15/2016   Obesity 03/15/2016   Elevated cholesterol 03/15/2016   Neuralgia 03/15/2016   Malignant melanoma of right side of neck ([place]) 03/15/2016   BBB (bundle branch block) 03/15/2016   High risk medication use 03/15/2016   Osteoarthritis of lumbar spine 03/15/2016   Primary osteoarthritis of both hands 03/15/2016   Primary osteoarthritis of both knees 03/15/2016   Mesenteric adenitis 06/29/2015    PCP: Orpah Melter, MD   REFERRING  PROVIDER: Mcarthur Rossetti, MD  REFERRING DIAG: [phone] (ICD-10-CM) - Status post arthroscopy of right shoulder  THERAPY DIAG:  Chronic right shoulder pain  Stiffness of right shoulder, not elsewhere classified  Abnormal posture  Muscle weakness (generalized)  Cramp and spasm  Localized edema  RATIONALE FOR EVALUATION AND TREATMENT: Rehabilitation  ONSET DATE: ~12/08/21 - R shoulder arthroscopy with debridement and subacromial decompression  NEXT MD VISIT: 03/22/2022   SUBJECTIVE:                                                                                                                                                                                      SUBJECTIVE STATEMENT: Pt reports that Dr. Ninfa Linden gave steroid injection, which helped out with pain, he also would like to do MRI in about a month depending on how he is doing.  PAIN: Are you having pain? Yes: NPRS scale: 2/10 Pain location: R shoulder and biceps Pain description: sharp, spasm  PERTINENT HISTORY: R shoulder scope 11/2021; recent R olecranon bursitis and R UE cellulitis since R shoulder scope; ACDF 02/26/20; R radical neck dissection 2 melanoma 1984; B CTR 2014; B TKA 12/05/10; OA - lumbar spine, B hands, B knees; cervical DJD; lumbar DDD; RA; HTN; BBB; neuralgia; obesity  PRECAUTIONS: None  WEIGHT BEARING RESTRICTIONS: No  FALLS:  Has patient fallen in last 6 months? Yes. Number of falls 1 - landing on R shoulder  LIVING ENVIRONMENT: Lives with: lives with their spouse Lives in: House/apartment Stairs: Yes: Internal: 15 steps; on left going up and External: 3 steps; none Has following equipment at home: None  OCCUPATION: Retired  PLOF: Independent and Leisure: work around [employer], yard work, Development worker, international aid, remodeling  PATIENT GOALS: "Pain-free shoulder."   OBJECTIVE:   DIAGNOSTIC FINDINGS:  01/04/22 - R shoulder x-ray:  FINDINGS: There is no evidence of fracture or  dislocation. Mild degenerative changes are present at the acromioclavicular and glenohumeral joints. Soft tissues are unremarkable. IMPRESSION: No acute osseous abnormality. 11/12/20 - R shoulder x-ray: No  glenohumeral joint space narrowing was noted.  Acromioclavicular narrowing was noted.  No chondrocalcinosis was noted.  Impression: These findings is consistent with acromioclavicular arthritis.  PATIENT SURVEYS:  Quick Dash 43.2 / 100 = 43.2%  COGNITION: Overall cognitive status: Within functional limits for tasks assessed     SENSATION: WFL  POSTURE: rounded shoulders, forward head, and depressed/protracted R shoulder s/p radical neck dissection 2 melanoma  UPPER EXTREMITY ROM:   Active ROM Right * Eval seated Left Eval seated Right 03/14/22 Right  03/21/22  Shoulder flexion 95 158 115 - mild pain 125  Shoulder extension 40 51    Shoulder abduction 78 171 80 - mild pain 115  Shoulder adduction      Shoulder internal rotation FIR to L3 FIR to T12    Shoulder external rotation Unable to FER FER to C6  49 sitting  Elbow flexion      Elbow extension      Wrist flexion      Wrist extension      Wrist ulnar deviation      Wrist radial deviation      Wrist pronation      Wrist supination       * Pain with all R shoulder AROM as well as pronation/supination  Passive ROM Right * Eval supine  Shoulder flexion 151  Shoulder extension   Shoulder abduction 143  Shoulder adduction   Shoulder internal rotation 69  at 45 ABD  Shoulder external rotation 80  at 45 ABD  (Blank rows = not tested) * Pain at end ROM  UPPER EXTREMITY MMT:  MMT Right * eval Left eval Right  03/21/22  Shoulder flexion 3+ 5 4 **  Shoulder extension 4 5 4+  Shoulder abduction 3- 5 4 **  Shoulder adduction     Shoulder internal rotation 4 5 4+  Shoulder external rotation 3- 5 3+ *  Middle trapezius     Lower trapezius     Elbow flexion     Elbow extension     Wrist flexion      Wrist extension     Wrist ulnar deviation     Wrist radial deviation     Wrist pronation     Wrist supination     Grip strength (lbs)     (Blank rows = not tested) * pain with all resisted motions, ** pain in biceps  SHOULDER SPECIAL TESTS: Impingement tests: Neer impingement test: positive   PALPATION:  TTP t/o R shoulder complex, esp pecs, deltoids, teres group, biceps   TODAY'S TREATMENT:  03/27/22 THERAPEUTIC EXERCISE: to improve flexibility, strength and mobility.  Verbal and tactile cues throughout for technique. Pulleys: flexion and scaption/abduction x 3 min each UBE L2 2 min fwd/2 min back Sidelying R shoulder: -ER with towel at side 2x10 -ABD to 90 deg 2x10 - ABD at 90 deg CW/CCW 2x15 sec each time Supine shoulder flexion CW/CCW 2x15 sec Supine shoulder protraction x 15 2#  Manual Therapy: STM to R ant and middle deltoid, biceps tendon  03/21/22 THERAPEUTIC EXERCISE: to improve flexibility, strength and mobility.  Verbal and tactile cues throughout for technique. Pulleys: flexion and scaption/abduction x 3 min each Sidelying:   R shoulder ER 10 x 3" - VC & TC for scap retraction and depression    R shoulder ABD 0-90 x 10 - VC & TC for humeral head depression    R shoulder horiz ABD x 10   R shoulder rhythmic stabilization at 90  ABD - 2 x 30 sec- 1st set with perturbations at elbow, 2nd set at wrist Hooklying   R shoulder rhythmic stabilization at 90 flexion - 2 x 30 sec- 1st set with perturbations at elbow, 2nd set at wrist   R shoulder protraction 2# 2 x 10   R shoulder CW/CCW circles at 90 flexion x 10 each direction  MANUAL THERAPY: To promote normalized muscle tension, improved flexibility, improved joint mobility, increased ROM, and reduced pain. STM and manual TPR to R middle deltoid Grade II-III R shoulder distraction, A/P, and inferior glides for pain relief and improved GH mobility   03/14/22 THERAPEUTIC EXERCISE: to improve flexibility,  strength and mobility.  Verbal and tactile cues throughout for technique.  Pulleys 3 min flexion , 3 min scaption Standing rows red TB x 20  Standing shoulder extension red TB x 10 Standing R shoulder ER/IR isometric step out x 10 red TB each Supine R shoulder flexion AROM x 10  Manual Therapy:  STM to R lateral deltoid, triceps      PATIENT EDUCATION: Education details: PT eval findings and anticipated POC Person educated: Patient Education method: Explanation Education comprehension: verbalized understanding   HOME EXERCISE PROGRAM: Access Code: [email or web address] URL: [email or web address] Date: 03/14/2022 Prepared by: Clarene Essex  Exercises - Sidelying Shoulder Flexion 15 Degrees  - 1 x daily - 7 x weekly - 3 sets - 10 reps - Sidelying Shoulder Abduction Palm Forward  - 1 x daily - 7 x weekly - 3 sets - 10 reps - Seated Shoulder External Rotation AAROM with Cane and Hand in Neutral  - 1 x daily - 7 x weekly - 3 sets - 10 reps - Standing Bilateral Low Shoulder Row with Anchored Resistance  - 1 x daily - 7 x weekly - 3 sets - 10 reps - Shoulder Extension with Resistance  - 1 x daily - 7 x weekly - 3 sets - 10 reps - Shoulder external rotation  - 1 x daily - 3-4 x weekly - 2 sets - 10 reps - Shoulder Internal Rotation Reactive Isometrics  - 1 x daily - 3-4 x weekly - 2 sets - 10 reps   ASSESSMENT:  CLINICAL IMPRESSION: Pt continues to do well with progressing exercises. Dr. Ninfa Linden gave a steroid injection last weeks and reports he may do MRI on the R shoulder again depending on how Mr. Carbonneau is doing by then. We continued working on supine and S/L, focusing on proper scapulohumeral rhythm. He was more challenged with the CW/CCW circles today in both positions. He had taut bands along the ant deltoids and biceps tendon. Pt responded well to treatment.   OBJECTIVE IMPAIRMENTS decreased ROM, decreased strength, increased edema, increased fascial restrictions, impaired  perceived functional ability, increased muscle spasms, impaired flexibility, impaired UE functional use, improper body mechanics, postural dysfunction, and pain.   ACTIVITY LIMITATIONS carrying, lifting, sleeping, bed mobility, bathing, dressing, reach over head, hygiene/grooming, and caring for others  PARTICIPATION LIMITATIONS: meal prep, cleaning, laundry, driving, and yard work  PERSONAL FACTORS Age, Past/current experiences, Time since onset of injury/illness/exacerbation, and 3+ comorbidities: recent R olecranon bursitis and R UE cellulitis since R shoulder scope; ACDF 02/26/20; R radical neck dissection 2 melanoma 1984; B CTR 2014; B TKA 12/05/10; OA - lumbar spine, B hands, B knees; cervical DJD; lumbar DDD; RA; HTN; BBB; neuralgia; obesity  are also affecting patient's functional outcome.   REHAB POTENTIAL: Good  CLINICAL DECISION MAKING: Evolving/moderate complexity  EVALUATION COMPLEXITY: Moderate  GOALS: Goals reviewed with patient? Yes  SHORT TERM GOALS: Target date: 03/21/2022   Patient will be independent with initial HEP to improve outcomes and carryover.  Baseline:  Goal status: MET - 03/21/22  2.  Patient will improve R shoulder flexion and abduction to >90 w/o pain provocation.  Baseline:  Goal status: MET - 03/21/22  3.  Patient to demonstrate improved upright posture with posterior shoulder girdle engaged to promote improved glenohumeral joint mobility. Baseline:  Goal status: MET - 03/21/22   LONG TERM GOALS: Target date: 04/18/2022   Patient will be independent with ongoing/advanced HEP for self-management at home.  Baseline:  Goal status: IN PROGRESS  2.  Patient will report 75% improvement in R shoulder/UE pain to improve QOL.  Baseline: up to 9/10 at worst Goal status: IN PROGRESS  03/21/22 - 30% reduction in pain  3.  Patient to improve R shoulder AROM to [facility] without pain provocation to allow for increased ease of ADLs.  Baseline:  Goal status:  IN PROGRESS  4.  Patient will demonstrate improved R strength to >/= 4 to 4+/5 for functional UE use. Baseline:  Goal status: IN PROGRESS  5  Patient will report </= 33% on QuickDASH to demonstrate improved functional ability.  Baseline: 43.2 / 100 = 43.2% Goal status: IN PROGRESS   PLAN: PT FREQUENCY: 2x/week  PT DURATION: 8 weeks  PLANNED INTERVENTIONS: Therapeutic exercises, Therapeutic activity, Neuromuscular re-education, Patient/Family education, Self Care, Joint mobilization, Dry Needling, Electrical stimulation, Cryotherapy, Moist heat, scar mobilization, Vasopneumatic device, Ultrasound, Ionotophoresis 34m/ml Dexamethasone, Manual therapy, and Re-evaluation  PLAN FOR NEXT SESSION: Try UBE for warm-up?; Progress scapular stabilization/postural strengthening; work on pain free AAAROM of L shoulder & RTC strengthening; MT +/- DN to address pain and abnormal muscle tension   Candas Deemer L Enedina Pair, PTA 03/27/2022, 10:25 AM

## 2022-03-30 ENCOUNTER — Encounter: Payer: Self-pay | Admitting: Physical Therapy

## 2022-03-30 ENCOUNTER — Ambulatory Visit: Payer: Medicare Other | Admitting: Physical Therapy

## 2022-03-30 DIAGNOSIS — G8929 Other chronic pain: Secondary | ICD-10-CM | POA: Diagnosis not present

## 2022-03-30 DIAGNOSIS — M25611 Stiffness of right shoulder, not elsewhere classified: Secondary | ICD-10-CM | POA: Diagnosis not present

## 2022-03-30 DIAGNOSIS — R600 Localized edema: Secondary | ICD-10-CM

## 2022-03-30 DIAGNOSIS — R293 Abnormal posture: Secondary | ICD-10-CM | POA: Diagnosis not present

## 2022-03-30 DIAGNOSIS — M6281 Muscle weakness (generalized): Secondary | ICD-10-CM | POA: Diagnosis not present

## 2022-03-30 DIAGNOSIS — R252 Cramp and spasm: Secondary | ICD-10-CM

## 2022-03-30 DIAGNOSIS — M25511 Pain in right shoulder: Secondary | ICD-10-CM | POA: Diagnosis not present

## 2022-03-30 NOTE — Therapy (Signed)
OUTPATIENT PHYSICAL THERAPY TREATMENT    Patient Name: David [name] MRN: [id number] DOB:1951/04/14, 71 y.o., male Today's Date: 03/30/2022       PT End of Session - 03/30/22 0936     Visit Number 7    Date for PT Re-Evaluation 04/18/22    Authorization Type BCBS Medicare    PT Start Time [phone]    PT Stop Time 1017    PT Time Calculation (min) 40 min    Activity Tolerance Patient tolerated treatment well    Behavior During Therapy [facility] for tasks assessed/performed                 Past Medical History:  Diagnosis Date   Arthritis    BBB (bundle branch block) 03/15/2016   Carpal tunnel syndrome    DDD (degenerative disc disease), lumbar 03/15/2016   Depression    Dysrhythmia    had some tachycardia with steroids   Elevated cholesterol 03/15/2016   Headache(784.0)    Ocular Migraines - takes Ambien for it   Heart murmur    HTN (hypertension) 03/15/2016   Hyperlipemia    Hypertension    Malignant melanoma of right side of neck ([place]) 03/15/2016   30 years ago    Neuralgia 03/15/2016   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    RLS (restless legs syndrome)    Sleep apnea    could not use a cpap   Snores    Status post gastric banding    Wears glasses    driving   Past Surgical History:  Procedure Laterality Date   ANTERIOR CERVICAL DECOMPRESSION/DISCECTOMY FUSION 4 LEVELS N/A 02/26/2020   Procedure: ANTERIOR CERVICAL DECOMPRESSION/DISCECTOMY FUSION, INTERBODY PROSTHESIS, PLATE/SCREWS CERVICAL THREE-FOUR CERVICAL FOUR-FIVE CERVICAL FIVE-SIX- CERVICAL SIX-SEVEN;  Surgeon: Newman Pies, MD;  Location: [place];  Service: Neurosurgery;  Laterality: N/A;   CARPAL TUNNEL RELEASE Right 09/26/2012   Procedure: CARPAL TUNNEL RELEASE;  Surgeon: Tennis Must, MD;  Location: [place];  Service: Orthopedics;  Laterality: Right;   CARPAL TUNNEL RELEASE Left 10/24/2012   Procedure:  CARPAL TUNNEL RELEASE;  Surgeon: Tennis Must, MD;  Location: [place];  Service: Orthopedics;  Laterality: Left;   EXCISION MELANOMA WITH SENTINEL LYMPH NODE BIOPSY  1984   rt neck with mulpipal nodes and some muscle excised rt neck-shoulder   KNEE ARTHROPLASTY     knee replacement     LAPAROSCOPIC GASTRIC BANDING  03/15/2009   LAPAROSCOPIC GASTRIC BANDING     MUSCLE BIOPSY  2004   rt thigh to r/o myocitis   nerve conduction velosity test     SHOULDER SURGERY Right 12/2021   TONSILLECTOMY     TOTAL KNEE ARTHROPLASTY Bilateral 12/05/2010   TRIGGER FINGER RELEASE Right 09/26/2012   Procedure: RELEASE TRIGGER FINGER/A-1 PULLEY RING AND SMALL ;  Surgeon: Tennis Must, MD;  Location: [place];  Service: Orthopedics;  Laterality: Right;   TRIGGER FINGER RELEASE Left 10/24/2012   Procedure: RELEASE TRIGGER FINGER/A-1 PULLEY LEFT MIDDLE AND LEFT RING;  Surgeon: Tennis Must, MD;  Location: [place];  Service: Orthopedics;  Laterality: Left;   UPPER GI ENDOSCOPY     Patient Active Problem List   Diagnosis Date Noted   Cervical spondylosis with radiculopathy 02/26/2020   SBO (small bowel obstruction) (David [name]) 08/21/2017   Trochanteric bursitis of left hip 01/18/2017   Lateral epicondylitis,  right elbow 01/18/2017   Rheumatoid arthritis (David [name]) 03/15/2016   DJD (degenerative joint disease), cervical 03/15/2016   DDD (degenerative disc disease), lumbar 03/15/2016   HTN (hypertension) 03/15/2016   Obesity 03/15/2016   Elevated cholesterol 03/15/2016   Neuralgia 03/15/2016   Malignant melanoma of right side of neck (David [name]) 03/15/2016   BBB (bundle branch block) 03/15/2016   High risk medication use 03/15/2016   Osteoarthritis of lumbar spine 03/15/2016   Primary osteoarthritis of both hands 03/15/2016   Primary osteoarthritis of both knees 03/15/2016   Mesenteric adenitis 06/29/2015    PCP: Orpah Melter, MD   REFERRING PROVIDER:  Mcarthur Rossetti, MD  REFERRING DIAG: [phone] (ICD-10-CM) - Status post arthroscopy of right shoulder  THERAPY DIAG:  Chronic right shoulder pain  Stiffness of right shoulder, not elsewhere classified  Abnormal posture  Muscle weakness (generalized)  Cramp and spasm  Localized edema  RATIONALE FOR EVALUATION AND TREATMENT: Rehabilitation  ONSET DATE: ~12/08/21 - R shoulder arthroscopy with debridement and subacromial decompression  NEXT MD VISIT: 03/22/2022   SUBJECTIVE:                                                                                                                                                                                      SUBJECTIVE STATEMENT: Pt reports the steroid shot has really helped along with the Celebrex.  PAIN: Are you having pain? Yes: NPRS scale: 2.5/10 Pain location: R shoulder and biceps Pain description: tenderness  PERTINENT HISTORY: R shoulder scope 11/2021; recent R olecranon bursitis and R UE cellulitis since R shoulder scope; ACDF 02/26/20; R radical neck dissection 2 melanoma 1984; B CTR 2014; B TKA 12/05/10; OA - lumbar spine, B hands, B knees; cervical DJD; lumbar DDD; RA; HTN; BBB; neuralgia; obesity  PRECAUTIONS: None  WEIGHT BEARING RESTRICTIONS: No  FALLS:  Has patient fallen in last 6 months? Yes. Number of falls 1 - landing on R shoulder  LIVING ENVIRONMENT: Lives with: lives with their spouse Lives in: House/apartment Stairs: Yes: Internal: 15 steps; on left going up and External: 3 steps; none Has following equipment at home: None  OCCUPATION: Retired  PLOF: Independent and Leisure: work around [employer], yard work, Development worker, international aid, remodeling  PATIENT GOALS: "Pain-free shoulder."   OBJECTIVE:   DIAGNOSTIC FINDINGS:  01/04/22 - R shoulder x-ray:  FINDINGS: There is no evidence of fracture or dislocation. Mild degenerative changes are present at the acromioclavicular and glenohumeral joints. Soft  tissues are unremarkable. IMPRESSION: No acute osseous abnormality. 11/12/20 - R shoulder x-ray: No glenohumeral joint space narrowing was noted.  Acromioclavicular narrowing was noted.  No chondrocalcinosis was noted.  Impression: These findings is  consistent with acromioclavicular arthritis.  PATIENT SURVEYS:  Quick Dash 43.2 / 100 = 43.2%  COGNITION: Overall cognitive status: Within functional limits for tasks assessed     SENSATION: WFL  POSTURE: rounded shoulders, forward head, and depressed/protracted R shoulder s/p radical neck dissection 2 melanoma  UPPER EXTREMITY ROM:   Active ROM Right * Eval seated Left Eval seated Right 03/14/22 Right  03/21/22  Shoulder flexion 95 158 115 - mild pain 125  Shoulder extension 40 51    Shoulder abduction 78 171 80 - mild pain 115  Shoulder adduction      Shoulder internal rotation FIR to L3 FIR to T12    Shoulder external rotation Unable to FER FER to C6  49 sitting  Elbow flexion      Elbow extension      Wrist flexion      Wrist extension      Wrist ulnar deviation      Wrist radial deviation      Wrist pronation      Wrist supination       * Pain with all R shoulder AROM as well as pronation/supination  Passive ROM Right * Eval supine  Shoulder flexion 151  Shoulder extension   Shoulder abduction 143  Shoulder adduction   Shoulder internal rotation 69  at 45 ABD  Shoulder external rotation 80  at 45 ABD  (Blank rows = not tested) * Pain at end ROM  UPPER EXTREMITY MMT:  MMT Right * eval Left eval Right  03/21/22  Shoulder flexion 3+ 5 4 **  Shoulder extension 4 5 4+  Shoulder abduction 3- 5 4 **  Shoulder adduction     Shoulder internal rotation 4 5 4+  Shoulder external rotation 3- 5 3+ *  Middle trapezius     Lower trapezius     Elbow flexion     Elbow extension     Wrist flexion     Wrist extension     Wrist ulnar deviation     Wrist radial deviation     Wrist pronation     Wrist  supination     Grip strength (lbs)     (Blank rows = not tested) * pain with all resisted motions, ** pain in biceps  SHOULDER SPECIAL TESTS: Impingement tests: Neer impingement test: positive   PALPATION:  TTP t/o R shoulder complex, esp pecs, deltoids, teres group, biceps   TODAY'S TREATMENT:   03/30/22 THERAPEUTIC EXERCISE: to improve flexibility, strength and mobility.  Verbal and tactile cues throughout for technique. UBE: L2.0 x 7 min (4' fwd & 3' back) Standing leaning prone over green Pball - I's, T's, Y's & W's 10 x 3" each Standing GTB rows with emphasis on scap retraction 10 x 3" - repeated cues to avoid shoulder shrug or excessive shoulder extension/rounding of shoulders  Standing GTB shoulder extension with emphasis on scap retraction 10 x 3"  Standing GTB "W" rows with emphasis on scap retraction 10 x 3"  Wall push-ups x 10 Serratus wall clocks attempted with looped RTB at wrists - deferred d/t limited control and increased pain Serratus roll-up attempted with looped RTB at wrists and forearms on FR - deferred d/t limited control and increased pain Wall push-up plus x10 ("plus" only) Sidelying:   R shoulder ER 10 x 3" - VC & TC for scap retraction and depression    R shoulder ABD 0-90 x 10 - VC & TC for humeral head depression  R shoulder horiz ABD x 10   R shoulder short arc flexion/extension from 90 flexion x 10, focusing on scapular engagement and maintaining arm parallel to mat table   R shoulder rhythmic stabilization at 90 ABD - 2 x 30 sec- 1st set with perturbations at elbow, 2nd set at wrist   03/27/22 THERAPEUTIC EXERCISE: to improve flexibility, strength and mobility.  Verbal and tactile cues throughout for technique. Pulleys: flexion and scaption/abduction x 3 min each UBE L2 2 min fwd/2 min back Sidelying R shoulder: -ER with towel at side 2x10 -ABD to 90 deg 2x10 - ABD at 90 deg CW/CCW 2x15 sec each time Supine shoulder flexion CW/CCW 2x15  sec Supine shoulder protraction x 15 2#  Manual Therapy: STM to R ant and middle deltoid, biceps tendon   03/21/22 THERAPEUTIC EXERCISE: to improve flexibility, strength and mobility.  Verbal and tactile cues throughout for technique. Pulleys: flexion and scaption/abduction x 3 min each Sidelying:   R shoulder ER 10 x 3" - VC & TC for scap retraction and depression    R shoulder ABD 0-90 x 10 - VC & TC for humeral head depression    R shoulder horiz ABD x 10   R shoulder rhythmic stabilization at 90 ABD - 2 x 30 sec- 1st set with perturbations at elbow, 2nd set at wrist Hooklying   R shoulder rhythmic stabilization at 90 flexion - 2 x 30 sec- 1st set with perturbations at elbow, 2nd set at wrist   R shoulder protraction 2# 2 x 10   R shoulder CW/CCW circles at 90 flexion x 10 each direction  MANUAL THERAPY: To promote normalized muscle tension, improved flexibility, improved joint mobility, increased ROM, and reduced pain. STM and manual TPR to R middle deltoid Grade II-III R shoulder distraction, A/P, and inferior glides for pain relief and improved GH mobility   PATIENT EDUCATION: Education details:  Pacing with exercises along with awareness of scapular engagement Person educated: Patient Education method: Explanation Education comprehension: verbalized understanding   HOME EXERCISE PROGRAM: Access Code: [email or web address] URL: [email or web address] Date: 03/14/2022 Prepared by: Clarene Essex  Exercises - Sidelying Shoulder Flexion 15 Degrees  - 1 x daily - 7 x weekly - 3 sets - 10 reps - Sidelying Shoulder Abduction Palm Forward  - 1 x daily - 7 x weekly - 3 sets - 10 reps - Seated Shoulder External Rotation AAROM with Cane and Hand in Neutral  - 1 x daily - 7 x weekly - 3 sets - 10 reps - Standing Bilateral Low Shoulder Row with Anchored Resistance  - 1 x daily - 7 x weekly - 3 sets - 10 reps - Shoulder Extension with Resistance  - 1 x daily - 7 x weekly - 3  sets - 10 reps - Shoulder external rotation  - 1 x daily - 3-4 x weekly - 2 sets - 10 reps - Shoulder Internal Rotation Reactive Isometrics  - 1 x daily - 3-4 x weekly - 2 sets - 10 reps   ASSESSMENT:  CLINICAL IMPRESSION: Logan reports continued good relief from cortisone injection and Celebrex with pain in R shoulder much better controlled now.  R shoulder AROM nearing baseline per pt report,  although control and endurance still somewhat limited due to decreased strength and poor scapular control.  TE focusing on scapular control for coordinated glenohumeral movement with patient requiring frequent cues for desired motion and scapular engagement.  He was limited with most attempts at serratus strengthening due  to poor control and/or increased pain.  He tends to be impulsive at times with exercises requiring cues to slow pace and stay focused on desired motions to avoid increasing irritation in right shoulder.  Derick will continue to benefit from skilled physical therapy to improve strength and motor control to allow improved functional use of right upper extremity.  OBJECTIVE IMPAIRMENTS decreased ROM, decreased strength, increased edema, increased fascial restrictions, impaired perceived functional ability, increased muscle spasms, impaired flexibility, impaired UE functional use, improper body mechanics, postural dysfunction, and pain.   ACTIVITY LIMITATIONS carrying, lifting, sleeping, bed mobility, bathing, dressing, reach over head, hygiene/grooming, and caring for others  PARTICIPATION LIMITATIONS: meal prep, cleaning, laundry, driving, and yard work  PERSONAL FACTORS Age, Past/current experiences, Time since onset of injury/illness/exacerbation, and 3+ comorbidities: recent R olecranon bursitis and R UE cellulitis since R shoulder scope; ACDF 02/26/20; R radical neck dissection 2 melanoma 1984; B CTR 2014; B TKA 12/05/10; OA - lumbar spine, B hands, B knees; cervical DJD; lumbar DDD; RA;  HTN; BBB; neuralgia; obesity  are also affecting patient's functional outcome.   REHAB POTENTIAL: Good  CLINICAL DECISION MAKING: Evolving/moderate complexity  EVALUATION COMPLEXITY: Moderate   GOALS: Goals reviewed with patient? Yes  SHORT TERM GOALS: Target date: 03/21/2022   Patient will be independent with initial HEP to improve outcomes and carryover.  Baseline:  Goal status: MET - 03/21/22  2.  Patient will improve R shoulder flexion and abduction to >90 w/o pain provocation.  Baseline:  Goal status: MET - 03/21/22  3.  Patient to demonstrate improved upright posture with posterior shoulder girdle engaged to promote improved glenohumeral joint mobility. Baseline:  Goal status: MET - 03/21/22   LONG TERM GOALS: Target date: 04/18/2022   Patient will be independent with ongoing/advanced HEP for self-management at home.  Baseline:  Goal status: IN PROGRESS  2.  Patient will report 75% improvement in R shoulder/UE pain to improve QOL.  Baseline: up to 9/10 at worst Goal status: IN PROGRESS  03/21/22 - 30% reduction in pain  3.  Patient to improve R shoulder AROM to [facility] without pain provocation to allow for increased ease of ADLs.  Baseline:  Goal status: IN PROGRESS  4.  Patient will demonstrate improved R strength to >/= 4 to 4+/5 for functional UE use. Baseline:  Goal status: IN PROGRESS  5  Patient will report </= 33% on QuickDASH to demonstrate improved functional ability.  Baseline: 43.2 / 100 = 43.2% Goal status: IN PROGRESS   PLAN: PT FREQUENCY: 2x/week  PT DURATION: 8 weeks  PLANNED INTERVENTIONS: Therapeutic exercises, Therapeutic activity, Neuromuscular re-education, Patient/Family education, Self Care, Joint mobilization, Dry Needling, Electrical stimulation, Cryotherapy, Moist heat, scar mobilization, Vasopneumatic device, Ultrasound, Ionotophoresis 27m/ml Dexamethasone, Manual therapy, and Re-evaluation  PLAN FOR NEXT SESSION: Progress scapular  stabilization/postural strengthening; work on pain free AA/AROM of L shoulder & RTC strengthening; MT +/- DN to address pain and abnormal muscle tension   JPercival Spanish PT 03/30/2022, 10:33 AM

## 2022-04-03 ENCOUNTER — Ambulatory Visit: Payer: Medicare Other

## 2022-04-03 DIAGNOSIS — R252 Cramp and spasm: Secondary | ICD-10-CM | POA: Diagnosis not present

## 2022-04-03 DIAGNOSIS — G8929 Other chronic pain: Secondary | ICD-10-CM

## 2022-04-03 DIAGNOSIS — M25611 Stiffness of right shoulder, not elsewhere classified: Secondary | ICD-10-CM

## 2022-04-03 DIAGNOSIS — R600 Localized edema: Secondary | ICD-10-CM | POA: Diagnosis not present

## 2022-04-03 DIAGNOSIS — R293 Abnormal posture: Secondary | ICD-10-CM

## 2022-04-03 DIAGNOSIS — M25511 Pain in right shoulder: Secondary | ICD-10-CM | POA: Diagnosis not present

## 2022-04-03 DIAGNOSIS — M6281 Muscle weakness (generalized): Secondary | ICD-10-CM

## 2022-04-03 NOTE — Therapy (Signed)
OUTPATIENT PHYSICAL THERAPY TREATMENT    Patient Name: David [name] MRN: [id number] DOB:Apr 12, 1951, 71 y.o., male Today's Date: 04/03/2022       PT End of Session - 04/03/22 1045     Visit Number 8    Date for PT Re-Evaluation 04/18/22    Authorization Type BCBS Medicare    PT Start Time [phone]   pt late   PT Stop Time 1015    PT Time Calculation (min) 33 min    Activity Tolerance Patient tolerated treatment well    Behavior During Therapy [facility] for tasks assessed/performed                  Past Medical History:  Diagnosis Date   Arthritis    BBB (bundle branch block) 03/15/2016   Carpal tunnel syndrome    DDD (degenerative disc disease), lumbar 03/15/2016   Depression    Dysrhythmia    had some tachycardia with steroids   Elevated cholesterol 03/15/2016   Headache(784.0)    Ocular Migraines - takes Ambien for it   Heart murmur    HTN (hypertension) 03/15/2016   Hyperlipemia    Hypertension    Malignant melanoma of right side of neck ([place]) 03/15/2016   30 years ago    Neuralgia 03/15/2016   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    RLS (restless legs syndrome)    Sleep apnea    could not use a cpap   Snores    Status post gastric banding    Wears glasses    driving   Past Surgical History:  Procedure Laterality Date   ANTERIOR CERVICAL DECOMPRESSION/DISCECTOMY FUSION 4 LEVELS N/A 02/26/2020   Procedure: ANTERIOR CERVICAL DECOMPRESSION/DISCECTOMY FUSION, INTERBODY PROSTHESIS, PLATE/SCREWS CERVICAL THREE-FOUR CERVICAL FOUR-FIVE CERVICAL FIVE-SIX- CERVICAL SIX-SEVEN;  Surgeon: Newman Pies, MD;  Location: [place];  Service: Neurosurgery;  Laterality: N/A;   CARPAL TUNNEL RELEASE Right 09/26/2012   Procedure: CARPAL TUNNEL RELEASE;  Surgeon: Tennis Must, MD;  Location: [place];  Service: Orthopedics;  Laterality: Right;   CARPAL TUNNEL RELEASE Left 10/24/2012    Procedure: CARPAL TUNNEL RELEASE;  Surgeon: Tennis Must, MD;  Location: [place];  Service: Orthopedics;  Laterality: Left;   EXCISION MELANOMA WITH SENTINEL LYMPH NODE BIOPSY  1984   rt neck with mulpipal nodes and some muscle excised rt neck-shoulder   KNEE ARTHROPLASTY     knee replacement     LAPAROSCOPIC GASTRIC BANDING  03/15/2009   LAPAROSCOPIC GASTRIC BANDING     MUSCLE BIOPSY  2004   rt thigh to r/o myocitis   nerve conduction velosity test     SHOULDER SURGERY Right 12/2021   TONSILLECTOMY     TOTAL KNEE ARTHROPLASTY Bilateral 12/05/2010   TRIGGER FINGER RELEASE Right 09/26/2012   Procedure: RELEASE TRIGGER FINGER/A-1 PULLEY RING AND SMALL ;  Surgeon: Tennis Must, MD;  Location: [place];  Service: Orthopedics;  Laterality: Right;   TRIGGER FINGER RELEASE Left 10/24/2012   Procedure: RELEASE TRIGGER FINGER/A-1 PULLEY LEFT MIDDLE AND LEFT RING;  Surgeon: Tennis Must, MD;  Location: [place];  Service: Orthopedics;  Laterality: Left;   UPPER GI ENDOSCOPY     Patient Active Problem List   Diagnosis Date Noted   Cervical spondylosis with radiculopathy 02/26/2020   SBO (small bowel obstruction) ([place]) 08/21/2017   Trochanteric bursitis of left hip 01/18/2017  Lateral epicondylitis, right elbow 01/18/2017   Rheumatoid arthritis ([place]) 03/15/2016   DJD (degenerative joint disease), cervical 03/15/2016   DDD (degenerative disc disease), lumbar 03/15/2016   HTN (hypertension) 03/15/2016   Obesity 03/15/2016   Elevated cholesterol 03/15/2016   Neuralgia 03/15/2016   Malignant melanoma of right side of neck ([place]) 03/15/2016   BBB (bundle branch block) 03/15/2016   High risk medication use 03/15/2016   Osteoarthritis of lumbar spine 03/15/2016   Primary osteoarthritis of both hands 03/15/2016   Primary osteoarthritis of both knees 03/15/2016   Mesenteric adenitis 06/29/2015    PCP: Orpah Melter, MD   REFERRING  PROVIDER: Mcarthur Rossetti, MD  REFERRING DIAG: [phone] (ICD-10-CM) - Status post arthroscopy of right shoulder  THERAPY DIAG:  Chronic right shoulder pain  Stiffness of right shoulder, not elsewhere classified  Abnormal posture  Muscle weakness (generalized)  Cramp and spasm  Localized edema  RATIONALE FOR EVALUATION AND TREATMENT: Rehabilitation  ONSET DATE: ~12/08/21 - R shoulder arthroscopy with debridement and subacromial decompression  NEXT MD VISIT: 03/22/2022   SUBJECTIVE:                                                                                                                                                                                      SUBJECTIVE STATEMENT: No issues with shoulder today  PAIN: Are you having pain? No  PERTINENT HISTORY: R shoulder scope 11/2021; recent R olecranon bursitis and R UE cellulitis since R shoulder scope; ACDF 02/26/20; R radical neck dissection 2 melanoma 1984; B CTR 2014; B TKA 12/05/10; OA - lumbar spine, B hands, B knees; cervical DJD; lumbar DDD; RA; HTN; BBB; neuralgia; obesity  PRECAUTIONS: None  WEIGHT BEARING RESTRICTIONS: No  FALLS:  Has patient fallen in last 6 months? Yes. Number of falls 1 - landing on R shoulder  LIVING ENVIRONMENT: Lives with: lives with their spouse Lives in: House/apartment Stairs: Yes: Internal: 15 steps; on left going up and External: 3 steps; none Has following equipment at home: None  OCCUPATION: Retired  PLOF: Independent and Leisure: work around [employer], yard work, Development worker, international aid, remodeling  PATIENT GOALS: "Pain-free shoulder."   OBJECTIVE:   DIAGNOSTIC FINDINGS:  01/04/22 - R shoulder x-ray:  FINDINGS: There is no evidence of fracture or dislocation. Mild degenerative changes are present at the acromioclavicular and glenohumeral joints. Soft tissues are unremarkable. IMPRESSION: No acute osseous abnormality. 11/12/20 - R shoulder x-ray: No glenohumeral  joint space narrowing was noted.  Acromioclavicular narrowing was noted.  No chondrocalcinosis was noted.  Impression: These findings is consistent with acromioclavicular arthritis.  PATIENT SURVEYS:  Quick Dash 43.2 / 100 = 43.2%  COGNITION:  Overall cognitive status: Within functional limits for tasks assessed     SENSATION: WFL  POSTURE: rounded shoulders, forward head, and depressed/protracted R shoulder s/p radical neck dissection 2 melanoma  UPPER EXTREMITY ROM:   Active ROM Right * Eval seated Left Eval seated Right 03/14/22 Right  03/21/22  Shoulder flexion 95 158 115 - mild pain 125  Shoulder extension 40 51    Shoulder abduction 78 171 80 - mild pain 115  Shoulder adduction      Shoulder internal rotation FIR to L3 FIR to T12    Shoulder external rotation Unable to FER FER to C6  49 sitting  Elbow flexion      Elbow extension      Wrist flexion      Wrist extension      Wrist ulnar deviation      Wrist radial deviation      Wrist pronation      Wrist supination       * Pain with all R shoulder AROM as well as pronation/supination  Passive ROM Right * Eval supine  Shoulder flexion 151  Shoulder extension   Shoulder abduction 143  Shoulder adduction   Shoulder internal rotation 69  at 45 ABD  Shoulder external rotation 80  at 45 ABD  (Blank rows = not tested) * Pain at end ROM  UPPER EXTREMITY MMT:  MMT Right * eval Left eval Right  03/21/22  Shoulder flexion 3+ 5 4 **  Shoulder extension 4 5 4+  Shoulder abduction 3- 5 4 **  Shoulder adduction     Shoulder internal rotation 4 5 4+  Shoulder external rotation 3- 5 3+ *  Middle trapezius     Lower trapezius     Elbow flexion     Elbow extension     Wrist flexion     Wrist extension     Wrist ulnar deviation     Wrist radial deviation     Wrist pronation     Wrist supination     Grip strength (lbs)     (Blank rows = not tested) * pain with all resisted motions, ** pain in  biceps  SHOULDER SPECIAL TESTS: Impingement tests: Neer impingement test: positive   PALPATION:  TTP t/o R shoulder complex, esp pecs, deltoids, teres group, biceps   TODAY'S TREATMENT:  04/03/22 THERAPEUTIC EXERCISE: to improve flexibility, strength and mobility.  Verbal and tactile cues throughout for technique. UBE: L2.0 x 4 min (2' fwd & 2' back) S/L L shld abduction 2x10- cues to keep elbow neutral to avoid strain on shoulder S/L L shld flexion x 15 - cues to keep shoulder depressed S/L L shld ER  x 15  Prone shoulder extension 1lb weight x 20 Prone shoulder flexion x 10; scaption x 10 1lb  03/30/22 THERAPEUTIC EXERCISE: to improve flexibility, strength and mobility.  Verbal and tactile cues throughout for technique. UBE: L2.0 x 7 min (4' fwd & 3' back) Standing leaning prone over green Pball - I's, T's, Y's & W's 10 x 3" each Standing GTB rows with emphasis on scap retraction 10 x 3" - repeated cues to avoid shoulder shrug or excessive shoulder extension/rounding of shoulders  Standing GTB shoulder extension with emphasis on scap retraction 10 x 3"  Standing GTB "W" rows with emphasis on scap retraction 10 x 3"  Wall push-ups x 10 Serratus wall clocks attempted with looped RTB at wrists - deferred d/t limited control and increased pain Serratus roll-up  attempted with looped RTB at wrists and forearms on FR - deferred d/t limited control and increased pain Wall push-up plus x10 ("plus" only) Sidelying:   R shoulder ER 10 x 3" - VC & TC for scap retraction and depression    R shoulder ABD 0-90 x 10 - VC & TC for humeral head depression    R shoulder horiz ABD x 10   R shoulder short arc flexion/extension from 90 flexion x 10, focusing on scapular engagement and maintaining arm parallel to mat table   R shoulder rhythmic stabilization at 90 ABD - 2 x 30 sec- 1st set with perturbations at elbow, 2nd set at wrist   03/27/22 THERAPEUTIC EXERCISE: to improve flexibility,  strength and mobility.  Verbal and tactile cues throughout for technique. Pulleys: flexion and scaption/abduction x 3 min each UBE L2 2 min fwd/2 min back Sidelying R shoulder: -ER with towel at side 2x10 -ABD to 90 deg 2x10 - ABD at 90 deg CW/CCW 2x15 sec each time Supine shoulder flexion CW/CCW 2x15 sec Supine shoulder protraction x 15 2#  Manual Therapy: STM to R ant and middle deltoid, biceps tendon    PATIENT EDUCATION: Education details:  Pacing with exercises along with awareness of scapular engagement Person educated: Patient Education method: Explanation Education comprehension: verbalized understanding   HOME EXERCISE PROGRAM: Access Code: [email or web address] URL: [email or web address] Date: 03/14/2022 Prepared by: Clarene Essex  Exercises - Sidelying Shoulder Flexion 15 Degrees  - 1 x daily - 7 x weekly - 3 sets - 10 reps - Sidelying Shoulder Abduction Palm Forward  - 1 x daily - 7 x weekly - 3 sets - 10 reps - Seated Shoulder External Rotation AAROM with Cane and Hand in Neutral  - 1 x daily - 7 x weekly - 3 sets - 10 reps - Standing Bilateral Low Shoulder Row with Anchored Resistance  - 1 x daily - 7 x weekly - 3 sets - 10 reps - Shoulder Extension with Resistance  - 1 x daily - 7 x weekly - 3 sets - 10 reps - Shoulder external rotation  - 1 x daily - 3-4 x weekly - 2 sets - 10 reps - Shoulder Internal Rotation Reactive Isometrics  - 1 x daily - 3-4 x weekly - 2 sets - 10 reps   ASSESSMENT:  CLINICAL IMPRESSION: Mr. Lucchesi continues to show improved pain levels in R shoulder. Continued with progression of TherEx focusing on scapulohumeral rhythm to improve mechanics of shoulder. He required cues in S/L with abduction and flexion to keep elbow in line with torso and keep shoulder depressed to prevent impingement. Pt continues to demonstrate potential for improvement and would benefit from continued skilled physical therapy to address impairments.  OBJECTIVE  IMPAIRMENTS decreased ROM, decreased strength, increased edema, increased fascial restrictions, impaired perceived functional ability, increased muscle spasms, impaired flexibility, impaired UE functional use, improper body mechanics, postural dysfunction, and pain.   ACTIVITY LIMITATIONS carrying, lifting, sleeping, bed mobility, bathing, dressing, reach over head, hygiene/grooming, and caring for others  PARTICIPATION LIMITATIONS: meal prep, cleaning, laundry, driving, and yard work  PERSONAL FACTORS Age, Past/current experiences, Time since onset of injury/illness/exacerbation, and 3+ comorbidities: recent R olecranon bursitis and R UE cellulitis since R shoulder scope; ACDF 02/26/20; R radical neck dissection 2 melanoma 1984; B CTR 2014; B TKA 12/05/10; OA - lumbar spine, B hands, B knees; cervical DJD; lumbar DDD; RA; HTN; BBB; neuralgia; obesity  are also affecting patient's functional outcome.   REHAB POTENTIAL:  Good  CLINICAL DECISION MAKING: Evolving/moderate complexity  EVALUATION COMPLEXITY: Moderate   GOALS: Goals reviewed with patient? Yes  SHORT TERM GOALS: Target date: 03/21/2022   Patient will be independent with initial HEP to improve outcomes and carryover.  Baseline:  Goal status: MET - 03/21/22  2.  Patient will improve R shoulder flexion and abduction to >90 w/o pain provocation.  Baseline:  Goal status: MET - 03/21/22  3.  Patient to demonstrate improved upright posture with posterior shoulder girdle engaged to promote improved glenohumeral joint mobility. Baseline:  Goal status: MET - 03/21/22   LONG TERM GOALS: Target date: 04/18/2022   Patient will be independent with ongoing/advanced HEP for self-management at home.  Baseline:  Goal status: IN PROGRESS  2.  Patient will report 75% improvement in R shoulder/UE pain to improve QOL.  Baseline: up to 9/10 at worst Goal status: IN PROGRESS  03/21/22 - 30% reduction in pain  3.  Patient to improve R  shoulder AROM to [facility] without pain provocation to allow for increased ease of ADLs.  Baseline:  Goal status: IN PROGRESS  4.  Patient will demonstrate improved R strength to >/= 4 to 4+/5 for functional UE use. Baseline:  Goal status: IN PROGRESS  5  Patient will report </= 33% on QuickDASH to demonstrate improved functional ability.  Baseline: 43.2 / 100 = 43.2% Goal status: IN PROGRESS   PLAN: PT FREQUENCY: 2x/week  PT DURATION: 8 weeks  PLANNED INTERVENTIONS: Therapeutic exercises, Therapeutic activity, Neuromuscular re-education, Patient/Family education, Self Care, Joint mobilization, Dry Needling, Electrical stimulation, Cryotherapy, Moist heat, scar mobilization, Vasopneumatic device, Ultrasound, Ionotophoresis 1m/ml Dexamethasone, Manual therapy, and Re-evaluation  PLAN FOR NEXT SESSION: Progress scapular stabilization/postural strengthening; work on pain free AA/AROM of L shoulder & RTC strengthening; MT +/- DN to address pain and abnormal muscle tension   Meaghann Choo L Marieliz Strang, PTA 04/03/2022, 10:46 AM

## 2022-04-06 ENCOUNTER — Encounter: Payer: Self-pay | Admitting: Physical Therapy

## 2022-04-06 ENCOUNTER — Ambulatory Visit: Payer: Medicare Other | Admitting: Physical Therapy

## 2022-04-06 DIAGNOSIS — R293 Abnormal posture: Secondary | ICD-10-CM | POA: Diagnosis not present

## 2022-04-06 DIAGNOSIS — R252 Cramp and spasm: Secondary | ICD-10-CM

## 2022-04-06 DIAGNOSIS — R600 Localized edema: Secondary | ICD-10-CM

## 2022-04-06 DIAGNOSIS — M25611 Stiffness of right shoulder, not elsewhere classified: Secondary | ICD-10-CM

## 2022-04-06 DIAGNOSIS — M25511 Pain in right shoulder: Secondary | ICD-10-CM | POA: Diagnosis not present

## 2022-04-06 DIAGNOSIS — G8929 Other chronic pain: Secondary | ICD-10-CM

## 2022-04-06 DIAGNOSIS — M6281 Muscle weakness (generalized): Secondary | ICD-10-CM

## 2022-04-06 NOTE — Therapy (Signed)
OUTPATIENT PHYSICAL THERAPY TREATMENT    Patient Name: David [name] MRN: [id number] DOB:[birth year], 71 y.o., male Today's Date: 04/06/2022       PT End of Session - 04/06/22 0939     Visit Number 9    Date for PT Re-Evaluation 04/18/22    Authorization Type [employer] Medicare    Progress Note Due on Visit 15   MD PN on visit #5 - 03/18/22   PT Start Time 0939    PT Stop Time 1017    PT Time Calculation (min) 38 min    Activity Tolerance Patient tolerated treatment well    Behavior During Therapy [facility] for tasks assessed/performed                  Past Medical History:  Diagnosis Date   Arthritis    BBB (bundle branch block) 03/15/2016   Carpal tunnel syndrome    DDD (degenerative disc disease), lumbar 03/15/2016   Depression    Dysrhythmia    had some tachycardia with steroids   Elevated cholesterol 03/15/2016   Headache(784.0)    Ocular Migraines - takes Ambien for it   Heart murmur    HTN (hypertension) 03/15/2016   Hyperlipemia    Hypertension    Malignant melanoma of right side of neck ([place]) 03/15/2016   30 years ago    Neuralgia 03/15/2016   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    RLS (restless legs syndrome)    Sleep apnea    could not use a cpap   Snores    Status post gastric banding    Wears glasses    driving   Past Surgical History:  Procedure Laterality Date   ANTERIOR CERVICAL DECOMPRESSION/DISCECTOMY FUSION 4 LEVELS N/A 02/26/2020   Procedure: ANTERIOR CERVICAL DECOMPRESSION/DISCECTOMY FUSION, INTERBODY PROSTHESIS, PLATE/SCREWS CERVICAL THREE-FOUR CERVICAL FOUR-FIVE CERVICAL FIVE-SIX- CERVICAL SIX-SEVEN;  Surgeon: Newman Pies, MD;  Location: [place];  Service: Neurosurgery;  Laterality: N/A;   CARPAL TUNNEL RELEASE Right 09/26/2012   Procedure: CARPAL TUNNEL RELEASE;  Surgeon: Tennis Must, MD;  Location: [place];  Service: Orthopedics;   Laterality: Right;   CARPAL TUNNEL RELEASE Left 10/24/2012   Procedure: CARPAL TUNNEL RELEASE;  Surgeon: Tennis Must, MD;  Location: [place];  Service: Orthopedics;  Laterality: Left;   EXCISION MELANOMA WITH SENTINEL LYMPH NODE BIOPSY  1984   rt neck with mulpipal nodes and some muscle excised rt neck-shoulder   KNEE ARTHROPLASTY     knee replacement     LAPAROSCOPIC GASTRIC BANDING  03/15/2009   LAPAROSCOPIC GASTRIC BANDING     MUSCLE BIOPSY  2004   rt thigh to r/o myocitis   nerve conduction velosity test     SHOULDER SURGERY Right 12/2021   TONSILLECTOMY     TOTAL KNEE ARTHROPLASTY Bilateral 12/05/2010   TRIGGER FINGER RELEASE Right 09/26/2012   Procedure: RELEASE TRIGGER FINGER/A-1 PULLEY RING AND SMALL ;  Surgeon: Tennis Must, MD;  Location: [place];  Service: Orthopedics;  Laterality: Right;   TRIGGER FINGER RELEASE Left 10/24/2012   Procedure: RELEASE TRIGGER FINGER/A-1 PULLEY LEFT MIDDLE AND LEFT RING;  Surgeon: Tennis Must, MD;  Location: [place];  Service: Orthopedics;  Laterality: Left;   UPPER GI ENDOSCOPY     Patient Active Problem List   Diagnosis Date Noted   Cervical spondylosis with radiculopathy 02/26/2020  SBO (small bowel obstruction) ([place]) 08/21/2017   Trochanteric bursitis of left hip 01/18/2017   Lateral epicondylitis, right elbow 01/18/2017   Rheumatoid arthritis ([place]) 03/15/2016   DJD (degenerative joint disease), cervical 03/15/2016   DDD (degenerative disc disease), lumbar 03/15/2016   HTN (hypertension) 03/15/2016   Obesity 03/15/2016   Elevated cholesterol 03/15/2016   Neuralgia 03/15/2016   Malignant melanoma of right side of neck ([place]) 03/15/2016   BBB (bundle branch block) 03/15/2016   High risk medication use 03/15/2016   Osteoarthritis of lumbar spine 03/15/2016   Primary osteoarthritis of both hands 03/15/2016   Primary osteoarthritis of both knees 03/15/2016   Mesenteric adenitis  06/29/2015    PCP: Orpah Melter, MD   REFERRING PROVIDER: Mcarthur Rossetti, MD  REFERRING DIAG: [phone] (ICD-10-CM) - Status post arthroscopy of right shoulder  THERAPY DIAG:  Chronic right shoulder pain  Stiffness of right shoulder, not elsewhere classified  Abnormal posture  Muscle weakness (generalized)  Cramp and spasm  Localized edema  RATIONALE FOR EVALUATION AND TREATMENT: Rehabilitation  ONSET DATE: ~12/08/21 - R shoulder arthroscopy with debridement and subacromial decompression  NEXT MD VISIT: 04/24/2022   SUBJECTIVE:                                                                                                                                                                                      SUBJECTIVE STATEMENT: Pt states he feels his steroid shot is wearing off. He feels so TrP and tightness but no other issues.   PAIN: Are you having pain? Yes: NPRS scale: 3/10 Pain location: R shoulder Pain description: tightness Aggravating factors: movement Relieving factors: rest  PERTINENT HISTORY: R shoulder scope 11/2021; recent R olecranon bursitis and R UE cellulitis since R shoulder scope; ACDF 02/26/20; R radical neck dissection 2 melanoma 1984; B CTR 2014; B TKA 12/05/10; OA - lumbar spine, B hands, B knees; cervical DJD; lumbar DDD; RA; HTN; BBB; neuralgia; obesity  PRECAUTIONS: None  WEIGHT BEARING RESTRICTIONS: No  FALLS:  Has patient fallen in last 6 months? Yes. Number of falls 1 - landing on R shoulder  LIVING ENVIRONMENT: Lives with: lives with their spouse Lives in: House/apartment Stairs: Yes: Internal: 15 steps; on left going up and External: 3 steps; none Has following equipment at home: None  OCCUPATION: Retired  PLOF: Independent and Leisure: work around [employer], yard work, Development worker, international aid, remodeling  PATIENT GOALS: "Pain-free shoulder."   OBJECTIVE:   DIAGNOSTIC FINDINGS:  01/04/22 - R shoulder x-ray:   FINDINGS: There is no evidence of fracture or dislocation. Mild degenerative changes are present at the acromioclavicular and glenohumeral joints. Soft tissues are unremarkable. IMPRESSION:  No acute osseous abnormality. 11/12/20 - R shoulder x-ray: No glenohumeral joint space narrowing was noted.  Acromioclavicular narrowing was noted.  No chondrocalcinosis was noted.  Impression: These findings is consistent with acromioclavicular arthritis.  PATIENT SURVEYS:  Quick Dash 43.2 / 100 = 43.2%  COGNITION: Overall cognitive status: Within functional limits for tasks assessed     SENSATION: WFL  POSTURE: rounded shoulders, forward head, and depressed/protracted R shoulder s/p radical neck dissection 2 melanoma  UPPER EXTREMITY ROM:   Active ROM Right * Eval seated Left Eval seated Right 03/14/22 Right  03/21/22  Shoulder flexion 95 158 115 - mild pain 125  Shoulder extension 40 51    Shoulder abduction 78 171 80 - mild pain 115  Shoulder adduction      Shoulder internal rotation FIR to L3 FIR to T12    Shoulder external rotation Unable to FER FER to C6  49 sitting  Elbow flexion      Elbow extension      Wrist flexion      Wrist extension      Wrist ulnar deviation      Wrist radial deviation      Wrist pronation      Wrist supination       * Pain with all R shoulder AROM as well as pronation/supination  Passive ROM Right * Eval supine  Shoulder flexion 151  Shoulder extension   Shoulder abduction 143  Shoulder adduction   Shoulder internal rotation 69  at 45 ABD  Shoulder external rotation 80  at 45 ABD  (Blank rows = not tested) * Pain at end ROM  UPPER EXTREMITY MMT:  MMT Right * eval Left eval Right  03/21/22  Shoulder flexion 3+ 5 4 **  Shoulder extension 4 5 4+  Shoulder abduction 3- 5 4 **  Shoulder adduction     Shoulder internal rotation 4 5 4+  Shoulder external rotation 3- 5 3+ *  Middle trapezius     Lower trapezius     Elbow flexion      Elbow extension     Wrist flexion     Wrist extension     Wrist ulnar deviation     Wrist radial deviation     Wrist pronation     Wrist supination     Grip strength (lbs)     (Blank rows = not tested) * pain with all resisted motions, ** pain in biceps  SHOULDER SPECIAL TESTS: Impingement tests: Neer impingement test: positive   PALPATION:  TTP t/o R shoulder complex, esp pecs, deltoids, teres group, biceps   TODAY'S TREATMENT:  04/06/22 THERAPEUTIC EXERCISE: to improve flexibility, strength and mobility.  Verbal and tactile cues throughout for technique. UBE 3 min fwd + 3 min back Standing leaning prone on green ball Ys, Ts, Is, Ws 1x10 each- cues for scapular retraction throughout movement, increased difficulty with W and I position on R shoulder due to increased pain and decreased ROM Standing Scapular Retraction + Row GTB in door frame 2x10 Standing Scapular Retraction + Shoulder Extension in door frame GTB 2x10 Wall push up plus 2x10, additional set of 1x15 with full push up including the plus- full push up with plus limited due to pain in anterior deltoid Wall Circle CW +CCW with ball shoulder flexion at 90 degrees 2x10 S/L R shoulder ER 2x10- cues for ER instead of compensated with elbow extension S/L R shoulder ABD 1x10 S/L R shoulder hori ABD 1x10- cues for  direction of motion and shoulder depression S/L R shoulder flexion/extension 1x5 deferred due to increased pain in anterior shoulder  MANUAL THERAPY: To promote normalized muscle tension and reduced pain. TrP release of infraspinatus   04/03/22 THERAPEUTIC EXERCISE: to improve flexibility, strength and mobility.  Verbal and tactile cues throughout for technique. UBE: L2.0 x 4 min (2' fwd & 2' back) S/L L shld abduction 2x10- cues to keep elbow neutral to avoid strain on shoulder S/L L shld flexion x 15 - cues to keep shoulder depressed S/L L shld ER  x 15  Prone shoulder extension 1lb weight x 20 Prone  shoulder flexion x 10; scaption x 10 1lb   03/30/22 THERAPEUTIC EXERCISE: to improve flexibility, strength and mobility.  Verbal and tactile cues throughout for technique. UBE: L2.0 x 7 min (4' fwd & 3' back) Standing leaning prone over green Pball - I's, T's, Y's & W's 10 x 3" each Standing GTB rows with emphasis on scap retraction 10 x 3" - repeated cues to avoid shoulder shrug or excessive shoulder extension/rounding of shoulders  Standing GTB shoulder extension with emphasis on scap retraction 10 x 3"  Standing GTB "W" rows with emphasis on scap retraction 10 x 3"  Wall push-ups x 10 Serratus wall clocks attempted with looped RTB at wrists - deferred d/t limited control and increased pain Serratus roll-up attempted with looped RTB at wrists and forearms on FR - deferred d/t limited control and increased pain Wall push-up plus x10 ("plus" only) Sidelying:   R shoulder ER 10 x 3" - VC & TC for scap retraction and depression    R shoulder ABD 0-90 x 10 - VC & TC for humeral head depression    R shoulder horiz ABD x 10   R shoulder short arc flexion/extension from 90 flexion x 10, focusing on scapular engagement and maintaining arm parallel to mat table   R shoulder rhythmic stabilization at 90 ABD - 2 x 30 sec- 1st set with perturbations at elbow, 2nd set at wrist    PATIENT EDUCATION: Education details:  awareness of scapular engagement Person educated: Patient Education method: Explanation Education comprehension: verbalized understanding   HOME EXERCISE PROGRAM: Access Code: [email or web address] URL: [email or web address] Date: 03/14/2022 Prepared by: Clarene Essex  Exercises - Sidelying Shoulder Flexion 15 Degrees  - 1 x daily - 7 x weekly - 3 sets - 10 reps - Sidelying Shoulder Abduction Palm Forward  - 1 x daily - 7 x weekly - 3 sets - 10 reps - Seated Shoulder External Rotation AAROM with Cane and Hand in Neutral  - 1 x daily - 7 x weekly - 3 sets - 10 reps -  Standing Bilateral Low Shoulder Row with Anchored Resistance  - 1 x daily - 7 x weekly - 3 sets - 10 reps - Shoulder Extension with Resistance  - 1 x daily - 7 x weekly - 3 sets - 10 reps - Shoulder external rotation  - 1 x daily - 3-4 x weekly - 2 sets - 10 reps - Shoulder Internal Rotation Reactive Isometrics  - 1 x daily - 3-4 x weekly - 2 sets - 10 reps   ASSESSMENT:  CLINICAL IMPRESSION: David [name] continues to show improvement with pain levels in R shoulder during motion but increased tension in ant deltoid and infraspinatus muscles. This session included continued progression of TE for scapular stabilization to improve motion and mechanics of R shoulder. David [name] needed cueing to increase scapular retraction throughout motions as well  as slow controlled movement patterns. He continues to demonstrate potential for improvement and would benefit from continued skilled therapy to address impairments.   OBJECTIVE IMPAIRMENTS decreased ROM, decreased strength, increased edema, increased fascial restrictions, impaired perceived functional ability, increased muscle spasms, impaired flexibility, impaired UE functional use, improper body mechanics, postural dysfunction, and pain.   ACTIVITY LIMITATIONS carrying, lifting, sleeping, bed mobility, bathing, dressing, reach over head, hygiene/grooming, and caring for others  PARTICIPATION LIMITATIONS: meal prep, cleaning, laundry, driving, and yard work  PERSONAL FACTORS Age, Past/current experiences, Time since onset of injury/illness/exacerbation, and 3+ comorbidities: recent R olecranon bursitis and R UE cellulitis since R shoulder scope; ACDF 02/26/20; R radical neck dissection 2 melanoma 1984; B CTR 2014; B TKA 12/05/10; OA - lumbar spine, B hands, B knees; cervical DJD; lumbar DDD; RA; HTN; BBB; neuralgia; obesity  are also affecting patient's functional outcome.   REHAB POTENTIAL: Good  CLINICAL DECISION MAKING: Evolving/moderate  complexity  EVALUATION COMPLEXITY: Moderate   GOALS: Goals reviewed with patient? Yes  SHORT TERM GOALS: Target date: 03/21/2022   Patient will be independent with initial HEP to improve outcomes and carryover.  Baseline:  Goal status: MET - 03/21/22  2.  Patient will improve R shoulder flexion and abduction to >90 w/o pain provocation.  Baseline:  Goal status: MET - 03/21/22  3.  Patient to demonstrate improved upright posture with posterior shoulder girdle engaged to promote improved glenohumeral joint mobility. Baseline:  Goal status: MET - 03/21/22   LONG TERM GOALS: Target date: 04/18/2022   Patient will be independent with ongoing/advanced HEP for self-management at home.  Baseline:  Goal status: IN PROGRESS  2.  Patient will report 75% improvement in R shoulder/UE pain to improve QOL.  Baseline: up to 9/10 at worst Goal status: IN PROGRESS  03/21/22 - 30% reduction in pain  3.  Patient to improve R shoulder AROM to [facility] without pain provocation to allow for increased ease of ADLs.  Baseline:  Goal status: IN PROGRESS  4.  Patient will demonstrate improved R strength to >/= 4 to 4+/5 for functional UE use. Baseline:  Goal status: IN PROGRESS  5  Patient will report </= 33% on QuickDASH to demonstrate improved functional ability.  Baseline: 43.2 / 100 = 43.2% Goal status: IN PROGRESS   PLAN: PT FREQUENCY: 2x/week  PT DURATION: 8 weeks  PLANNED INTERVENTIONS: Therapeutic exercises, Therapeutic activity, Neuromuscular re-education, Patient/Family education, Self Care, Joint mobilization, Dry Needling, Electrical stimulation, Cryotherapy, Moist heat, scar mobilization, Vasopneumatic device, Ultrasound, Ionotophoresis 30m/ml Dexamethasone, Manual therapy, and Re-evaluation  PLAN FOR NEXT SESSION: Progress scapular stabilization/postural strengthening; work on pain free AA/AROM of L shoulder & RTC strengthening; MT +/- DN to address pain and abnormal muscle  tension   OZeb Comfort Student-PT 04/06/2022, 10:24 AM

## 2022-04-10 ENCOUNTER — Ambulatory Visit: Payer: Medicare Other

## 2022-04-10 DIAGNOSIS — R252 Cramp and spasm: Secondary | ICD-10-CM | POA: Diagnosis not present

## 2022-04-10 DIAGNOSIS — R600 Localized edema: Secondary | ICD-10-CM | POA: Diagnosis not present

## 2022-04-10 DIAGNOSIS — M6281 Muscle weakness (generalized): Secondary | ICD-10-CM

## 2022-04-10 DIAGNOSIS — G8929 Other chronic pain: Secondary | ICD-10-CM

## 2022-04-10 DIAGNOSIS — R293 Abnormal posture: Secondary | ICD-10-CM

## 2022-04-10 DIAGNOSIS — M25611 Stiffness of right shoulder, not elsewhere classified: Secondary | ICD-10-CM

## 2022-04-10 DIAGNOSIS — M25511 Pain in right shoulder: Secondary | ICD-10-CM | POA: Diagnosis not present

## 2022-04-10 NOTE — Therapy (Signed)
OUTPATIENT PHYSICAL THERAPY TREATMENT    Patient Name: David [name] MRN: [id number] DOB:1950/08/23, 71 y.o., male Today's Date: 04/10/2022       PT End of Session - 04/10/22 1016     Visit Number 10    Date for PT Re-Evaluation 04/18/22    Authorization Type BCBS Medicare    Progress Note Due on Visit 15   MD PN on visit #5 - 03/21/22   PT Start Time 0932    PT Stop Time 1014    PT Time Calculation (min) 42 min    Activity Tolerance Patient tolerated treatment well    Behavior During Therapy [facility] for tasks assessed/performed                  Past Medical History:  Diagnosis Date   Arthritis    BBB (bundle branch block) 03/15/2016   Carpal tunnel syndrome    DDD (degenerative disc disease), lumbar 03/15/2016   Depression    Dysrhythmia    had some tachycardia with steroids   Elevated cholesterol 03/15/2016   Headache(784.0)    Ocular Migraines - takes Ambien for it   Heart murmur    HTN (hypertension) 03/15/2016   Hyperlipemia    Hypertension    Malignant melanoma of right side of neck ([place]) 03/15/2016   30 years ago    Neuralgia 03/15/2016   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    RLS (restless legs syndrome)    Sleep apnea    could not use a cpap   Snores    Status post gastric banding    Wears glasses    driving   Past Surgical History:  Procedure Laterality Date   ANTERIOR CERVICAL DECOMPRESSION/DISCECTOMY FUSION 4 LEVELS N/A 02/26/2020   Procedure: ANTERIOR CERVICAL DECOMPRESSION/DISCECTOMY FUSION, INTERBODY PROSTHESIS, PLATE/SCREWS CERVICAL THREE-FOUR CERVICAL FOUR-FIVE CERVICAL FIVE-SIX- CERVICAL SIX-SEVEN;  Surgeon: Newman Pies, MD;  Location: [place];  Service: Neurosurgery;  Laterality: N/A;   CARPAL TUNNEL RELEASE Right 09/26/2012   Procedure: CARPAL TUNNEL RELEASE;  Surgeon: Tennis Must, MD;  Location: [place];  Service: Orthopedics;   Laterality: Right;   CARPAL TUNNEL RELEASE Left 10/24/2012   Procedure: CARPAL TUNNEL RELEASE;  Surgeon: Tennis Must, MD;  Location: [place];  Service: Orthopedics;  Laterality: Left;   EXCISION MELANOMA WITH SENTINEL LYMPH NODE BIOPSY  1984   rt neck with mulpipal nodes and some muscle excised rt neck-shoulder   KNEE ARTHROPLASTY     knee replacement     LAPAROSCOPIC GASTRIC BANDING  03/15/2009   LAPAROSCOPIC GASTRIC BANDING     MUSCLE BIOPSY  2004   rt thigh to r/o myocitis   nerve conduction velosity test     SHOULDER SURGERY Right 12/2021   TONSILLECTOMY     TOTAL KNEE ARTHROPLASTY Bilateral 12/05/2010   TRIGGER FINGER RELEASE Right 09/26/2012   Procedure: RELEASE TRIGGER FINGER/A-1 PULLEY RING AND SMALL ;  Surgeon: Tennis Must, MD;  Location: [place];  Service: Orthopedics;  Laterality: Right;   TRIGGER FINGER RELEASE Left 10/24/2012   Procedure: RELEASE TRIGGER FINGER/A-1 PULLEY LEFT MIDDLE AND LEFT RING;  Surgeon: Tennis Must, MD;  Location: [place];  Service: Orthopedics;  Laterality: Left;   UPPER GI ENDOSCOPY     Patient Active Problem List   Diagnosis Date Noted   Cervical spondylosis with radiculopathy 02/26/2020  SBO (small bowel obstruction) ([place]) 08/21/2017   Trochanteric bursitis of left hip 01/18/2017   Lateral epicondylitis, right elbow 01/18/2017   Rheumatoid arthritis ([place]) 03/15/2016   DJD (degenerative joint disease), cervical 03/15/2016   DDD (degenerative disc disease), lumbar 03/15/2016   HTN (hypertension) 03/15/2016   Obesity 03/15/2016   Elevated cholesterol 03/15/2016   Neuralgia 03/15/2016   Malignant melanoma of right side of neck ([place]) 03/15/2016   BBB (bundle branch block) 03/15/2016   High risk medication use 03/15/2016   Osteoarthritis of lumbar spine 03/15/2016   Primary osteoarthritis of both hands 03/15/2016   Primary osteoarthritis of both knees 03/15/2016   Mesenteric adenitis  06/29/2015    PCP: Orpah Melter, MD   REFERRING PROVIDER: Mcarthur Rossetti, MD  REFERRING DIAG: [phone] (ICD-10-CM) - Status post arthroscopy of right shoulder  THERAPY DIAG:  Chronic right shoulder pain  Stiffness of right shoulder, not elsewhere classified  Abnormal posture  Muscle weakness (generalized)  Cramp and spasm  Localized edema  RATIONALE FOR EVALUATION AND TREATMENT: Rehabilitation  ONSET DATE: ~12/08/21 - R shoulder arthroscopy with debridement and subacromial decompression  NEXT MD VISIT: 04/24/2022   SUBJECTIVE:                                                                                                                                                                                      SUBJECTIVE STATEMENT: Pt states he feels his steroid shot is wearing off. He feels so TrP and tightness but no other issues.   PAIN: Are you having pain? Yes: NPRS scale: 0/10 Pain location: R shoulder Pain description: tightness Aggravating factors: movement Relieving factors: rest  PERTINENT HISTORY: R shoulder scope 11/2021; recent R olecranon bursitis and R UE cellulitis since R shoulder scope; ACDF 02/26/20; R radical neck dissection 2 melanoma 1984; B CTR 2014; B TKA 12/05/10; OA - lumbar spine, B hands, B knees; cervical DJD; lumbar DDD; RA; HTN; BBB; neuralgia; obesity  PRECAUTIONS: None  WEIGHT BEARING RESTRICTIONS: No  FALLS:  Has patient fallen in last 6 months? Yes. Number of falls 1 - landing on R shoulder  LIVING ENVIRONMENT: Lives with: lives with their spouse Lives in: House/apartment Stairs: Yes: Internal: 15 steps; on left going up and External: 3 steps; none Has following equipment at home: None  OCCUPATION: Retired  PLOF: Independent and Leisure: work around [employer], yard work, Development worker, international aid, remodeling  PATIENT GOALS: "Pain-free shoulder."   OBJECTIVE:   DIAGNOSTIC FINDINGS:  01/04/22 - R shoulder x-ray:   FINDINGS: There is no evidence of fracture or dislocation. Mild degenerative changes are present at the acromioclavicular and glenohumeral joints. Soft tissues are unremarkable. IMPRESSION:  No acute osseous abnormality. 11/12/20 - R shoulder x-ray: No glenohumeral joint space narrowing was noted.  Acromioclavicular narrowing was noted.  No chondrocalcinosis was noted.  Impression: These findings is consistent with acromioclavicular arthritis.  PATIENT SURVEYS:  Quick Dash 43.2 / 100 = 43.2%  COGNITION: Overall cognitive status: Within functional limits for tasks assessed     SENSATION: WFL  POSTURE: rounded shoulders, forward head, and depressed/protracted R shoulder s/p radical neck dissection 2 melanoma  UPPER EXTREMITY ROM:   Active ROM Right * Eval seated Left Eval seated Right 03/14/22 Right  03/21/22  Shoulder flexion 95 158 115 - mild pain 125  Shoulder extension 40 51    Shoulder abduction 78 171 80 - mild pain 115  Shoulder adduction      Shoulder internal rotation FIR to L3 FIR to T12    Shoulder external rotation Unable to FER FER to C6  49 sitting  Elbow flexion      Elbow extension      Wrist flexion      Wrist extension      Wrist ulnar deviation      Wrist radial deviation      Wrist pronation      Wrist supination       * Pain with all R shoulder AROM as well as pronation/supination  Passive ROM Right * Eval supine  Shoulder flexion 151  Shoulder extension   Shoulder abduction 143  Shoulder adduction   Shoulder internal rotation 69  at 45 ABD  Shoulder external rotation 80  at 45 ABD  (Blank rows = not tested) * Pain at end ROM  UPPER EXTREMITY MMT:  MMT Right * eval Left eval Right  03/21/22  Shoulder flexion 3+ 5 4 **  Shoulder extension 4 5 4+  Shoulder abduction 3- 5 4 **  Shoulder adduction     Shoulder internal rotation 4 5 4+  Shoulder external rotation 3- 5 3+ *  Middle trapezius     Lower trapezius     Elbow flexion      Elbow extension     Wrist flexion     Wrist extension     Wrist ulnar deviation     Wrist radial deviation     Wrist pronation     Wrist supination     Grip strength (lbs)     (Blank rows = not tested) * pain with all resisted motions, ** pain in biceps  SHOULDER SPECIAL TESTS: Impingement tests: Neer impingement test: positive   PALPATION:  TTP t/o R shoulder complex, esp pecs, deltoids, teres group, biceps   TODAY'S TREATMENT:  04/06/22 THERAPEUTIC EXERCISE: to improve flexibility, strength and mobility.  Verbal and tactile cues throughout for technique. UBE 3.5 2 min fwd + 2 min back Pulley 3 min flexion/42mn abduction Horiz ABD GTB 2x10 standing in doorframe Serratus slides red TB 2x10 Wall push ups 2x10 STM to bil cervical occipitals and TPR  04/06/22 THERAPEUTIC EXERCISE: to improve flexibility, strength and mobility.  Verbal and tactile cues throughout for technique. UBE 3 min fwd + 3 min back Standing leaning prone on green ball Ys, Ts, Is, Ws 1x10 each- cues for scapular retraction throughout movement, increased difficulty with W and I position on R shoulder due to increased pain and decreased ROM Standing Scapular Retraction + Row GTB in door frame 2x10 Standing Scapular Retraction + Shoulder Extension in door frame GTB 2x10 Wall push up plus 2x10, additional set of 1x15 with full push  up including the plus- full push up with plus limited due to pain in anterior deltoid Wall Circle CW +CCW with ball shoulder flexion at 90 degrees 2x10 S/L R shoulder ER 2x10- cues for ER instead of compensated with elbow extension S/L R shoulder ABD 1x10 S/L R shoulder hori ABD 1x10- cues for direction of motion and shoulder depression S/L R shoulder flexion/extension 1x5 deferred due to increased pain in anterior shoulder  MANUAL THERAPY: To promote normalized muscle tension and reduced pain. TrP release of infraspinatus   04/03/22 THERAPEUTIC EXERCISE: to improve  flexibility, strength and mobility.  Verbal and tactile cues throughout for technique. UBE: L2.0 x 4 min (2' fwd & 2' back) S/L L shld abduction 2x10- cues to keep elbow neutral to avoid strain on shoulder S/L L shld flexion x 15 - cues to keep shoulder depressed S/L L shld ER  x 15  Prone shoulder extension 1lb weight x 20 Prone shoulder flexion x 10; scaption x 10 1lb   03/30/22 THERAPEUTIC EXERCISE: to improve flexibility, strength and mobility.  Verbal and tactile cues throughout for technique. UBE: L2.0 x 7 min (4' fwd & 3' back) Standing leaning prone over green Pball - I's, T's, Y's & W's 10 x 3" each Standing GTB rows with emphasis on scap retraction 10 x 3" - repeated cues to avoid shoulder shrug or excessive shoulder extension/rounding of shoulders  Standing GTB shoulder extension with emphasis on scap retraction 10 x 3"  Standing GTB "W" rows with emphasis on scap retraction 10 x 3"  Wall push-ups x 10 Serratus wall clocks attempted with looped RTB at wrists - deferred d/t limited control and increased pain Serratus roll-up attempted with looped RTB at wrists and forearms on FR - deferred d/t limited control and increased pain Wall push-up plus x10 ("plus" only) Sidelying:   R shoulder ER 10 x 3" - VC & TC for scap retraction and depression    R shoulder ABD 0-90 x 10 - VC & TC for humeral head depression    R shoulder horiz ABD x 10   R shoulder short arc flexion/extension from 90 flexion x 10, focusing on scapular engagement and maintaining arm parallel to mat table   R shoulder rhythmic stabilization at 90 ABD - 2 x 30 sec- 1st set with perturbations at elbow, 2nd set at wrist    PATIENT EDUCATION: Education details:  awareness of scapular engagement Person educated: Patient Education method: Explanation Education comprehension: verbalized understanding   HOME EXERCISE PROGRAM: Access Code: [email or web address] URL: [email or web address] Date:  03/14/2022 Prepared by: Clarene Essex  Exercises - Sidelying Shoulder Flexion 15 Degrees  - 1 x daily - 7 x weekly - 3 sets - 10 reps - Sidelying Shoulder Abduction Palm Forward  - 1 x daily - 7 x weekly - 3 sets - 10 reps - Seated Shoulder External Rotation AAROM with Cane and Hand in Neutral  - 1 x daily - 7 x weekly - 3 sets - 10 reps - Standing Bilateral Low Shoulder Row with Anchored Resistance  - 1 x daily - 7 x weekly - 3 sets - 10 reps - Shoulder Extension with Resistance  - 1 x daily - 7 x weekly - 3 sets - 10 reps - Shoulder external rotation  - 1 x daily - 3-4 x weekly - 2 sets - 10 reps - Shoulder Internal Rotation Reactive Isometrics  - 1 x daily - 3-4 x weekly - 2 sets - 10 reps  ASSESSMENT:  CLINICAL IMPRESSION: Continued to progress scapular stabilization with instruction provided throughout session by therapist. Pt had reports of ocular headache during session, did MT which afterward patient reported full resolution of ocular headache symptoms. Pt demonstrated a good response to treatment, will plan to continue progressing exercises.  OBJECTIVE IMPAIRMENTS decreased ROM, decreased strength, increased edema, increased fascial restrictions, impaired perceived functional ability, increased muscle spasms, impaired flexibility, impaired UE functional use, improper body mechanics, postural dysfunction, and pain.   ACTIVITY LIMITATIONS carrying, lifting, sleeping, bed mobility, bathing, dressing, reach over head, hygiene/grooming, and caring for others  PARTICIPATION LIMITATIONS: meal prep, cleaning, laundry, driving, and yard work  PERSONAL FACTORS Age, Past/current experiences, Time since onset of injury/illness/exacerbation, and 3+ comorbidities: recent R olecranon bursitis and R UE cellulitis since R shoulder scope; ACDF 02/26/20; R radical neck dissection 2 melanoma 1984; B CTR 2014; B TKA 12/05/10; OA - lumbar spine, B hands, B knees; cervical DJD; lumbar DDD; RA; HTN; BBB;  neuralgia; obesity  are also affecting patient's functional outcome.   REHAB POTENTIAL: Good  CLINICAL DECISION MAKING: Evolving/moderate complexity  EVALUATION COMPLEXITY: Moderate   GOALS: Goals reviewed with patient? Yes  SHORT TERM GOALS: Target date: 03/21/2022   Patient will be independent with initial HEP to improve outcomes and carryover.  Baseline:  Goal status: MET - 03/21/22  2.  Patient will improve R shoulder flexion and abduction to >90 w/o pain provocation.  Baseline:  Goal status: MET - 03/21/22  3.  Patient to demonstrate improved upright posture with posterior shoulder girdle engaged to promote improved glenohumeral joint mobility. Baseline:  Goal status: MET - 03/21/22   LONG TERM GOALS: Target date: 04/18/2022   Patient will be independent with ongoing/advanced HEP for self-management at home.  Baseline:  Goal status: IN PROGRESS  2.  Patient will report 75% improvement in R shoulder/UE pain to improve QOL.  Baseline: up to 9/10 at worst Goal status: IN PROGRESS  03/21/22 - 30% reduction in pain  3.  Patient to improve R shoulder AROM to [facility] without pain provocation to allow for increased ease of ADLs.  Baseline:  Goal status: IN PROGRESS  4.  Patient will demonstrate improved R strength to >/= 4 to 4+/5 for functional UE use. Baseline:  Goal status: IN PROGRESS  5  Patient will report </= 33% on QuickDASH to demonstrate improved functional ability.  Baseline: 43.2 / 100 = 43.2% Goal status: IN PROGRESS   PLAN: PT FREQUENCY: 2x/week  PT DURATION: 8 weeks  PLANNED INTERVENTIONS: Therapeutic exercises, Therapeutic activity, Neuromuscular re-education, Patient/Family education, Self Care, Joint mobilization, Dry Needling, Electrical stimulation, Cryotherapy, Moist heat, scar mobilization, Vasopneumatic device, Ultrasound, Ionotophoresis 29m/ml Dexamethasone, Manual therapy, and Re-evaluation  PLAN FOR NEXT SESSION: Progress scapular  stabilization/postural strengthening; work on pain free AA/AROM of L shoulder & RTC strengthening; MT +/- DN to address pain and abnormal muscle tension   Aldora Perman L Ivette Castronova, PTA 04/10/2022, 10:28 AM

## 2022-04-12 ENCOUNTER — Ambulatory Visit: Payer: Medicare Other

## 2022-04-12 DIAGNOSIS — R293 Abnormal posture: Secondary | ICD-10-CM

## 2022-04-12 DIAGNOSIS — R600 Localized edema: Secondary | ICD-10-CM

## 2022-04-12 DIAGNOSIS — G8929 Other chronic pain: Secondary | ICD-10-CM

## 2022-04-12 DIAGNOSIS — M25611 Stiffness of right shoulder, not elsewhere classified: Secondary | ICD-10-CM | POA: Diagnosis not present

## 2022-04-12 DIAGNOSIS — R252 Cramp and spasm: Secondary | ICD-10-CM

## 2022-04-12 DIAGNOSIS — M25511 Pain in right shoulder: Secondary | ICD-10-CM | POA: Diagnosis not present

## 2022-04-12 DIAGNOSIS — M6281 Muscle weakness (generalized): Secondary | ICD-10-CM | POA: Diagnosis not present

## 2022-04-12 NOTE — Therapy (Signed)
OUTPATIENT PHYSICAL THERAPY TREATMENT    Patient Name: David [name] MRN: [id number] DOB:1951/02/17, 71 y.o., male Today's Date: 04/12/2022       PT End of Session - 04/12/22 0944     Visit Number 11    Date for PT Re-Evaluation 04/18/22    Authorization Type [employer] Medicare    Progress Note Due on Visit 15   MD PN on visit #5 - 03/21/22   PT Start Time [phone]   pt arrived late   PT Stop Time 1017    PT Time Calculation (min) 39 min    Activity Tolerance Patient tolerated treatment well    Behavior During Therapy [facility] for tasks assessed/performed                  Past Medical History:  Diagnosis Date   Arthritis    BBB (bundle branch block) 03/15/2016   Carpal tunnel syndrome    DDD (degenerative disc disease), lumbar 03/15/2016   Depression    Dysrhythmia    had some tachycardia with steroids   Elevated cholesterol 03/15/2016   Headache(784.0)    Ocular Migraines - takes Ambien for it   Heart murmur    HTN (hypertension) 03/15/2016   Hyperlipemia    Hypertension    Malignant melanoma of right side of neck ([place]) 03/15/2016   30 years ago    Neuralgia 03/15/2016   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    RLS (restless legs syndrome)    Sleep apnea    could not use a cpap   Snores    Status post gastric banding    Wears glasses    driving   Past Surgical History:  Procedure Laterality Date   ANTERIOR CERVICAL DECOMPRESSION/DISCECTOMY FUSION 4 LEVELS N/A 02/26/2020   Procedure: ANTERIOR CERVICAL DECOMPRESSION/DISCECTOMY FUSION, INTERBODY PROSTHESIS, PLATE/SCREWS CERVICAL THREE-FOUR CERVICAL FOUR-FIVE CERVICAL FIVE-SIX- CERVICAL SIX-SEVEN;  Surgeon: Newman Pies, MD;  Location: [place];  Service: Neurosurgery;  Laterality: N/A;   CARPAL TUNNEL RELEASE Right 09/26/2012   Procedure: CARPAL TUNNEL RELEASE;  Surgeon: Tennis Must, MD;  Location: [place];  Service:  Orthopedics;  Laterality: Right;   CARPAL TUNNEL RELEASE Left 10/24/2012   Procedure: CARPAL TUNNEL RELEASE;  Surgeon: Tennis Must, MD;  Location: [place];  Service: Orthopedics;  Laterality: Left;   EXCISION MELANOMA WITH SENTINEL LYMPH NODE BIOPSY  1984   rt neck with mulpipal nodes and some muscle excised rt neck-shoulder   KNEE ARTHROPLASTY     knee replacement     LAPAROSCOPIC GASTRIC BANDING  03/15/2009   LAPAROSCOPIC GASTRIC BANDING     MUSCLE BIOPSY  2004   rt thigh to r/o myocitis   nerve conduction velosity test     SHOULDER SURGERY Right 12/2021   TONSILLECTOMY     TOTAL KNEE ARTHROPLASTY Bilateral 12/05/2010   TRIGGER FINGER RELEASE Right 09/26/2012   Procedure: RELEASE TRIGGER FINGER/A-1 PULLEY RING AND SMALL ;  Surgeon: Tennis Must, MD;  Location: [place];  Service: Orthopedics;  Laterality: Right;   TRIGGER FINGER RELEASE Left 10/24/2012   Procedure: RELEASE TRIGGER FINGER/A-1 PULLEY LEFT MIDDLE AND LEFT RING;  Surgeon: Tennis Must, MD;  Location: [place];  Service: Orthopedics;  Laterality: Left;   UPPER GI ENDOSCOPY     Patient Active Problem List   Diagnosis Date Noted   Cervical spondylosis with  radiculopathy 02/26/2020   SBO (small bowel obstruction) ([place]) 08/21/2017   Trochanteric bursitis of left hip 01/18/2017   Lateral epicondylitis, right elbow 01/18/2017   Rheumatoid arthritis ([place]) 03/15/2016   DJD (degenerative joint disease), cervical 03/15/2016   DDD (degenerative disc disease), lumbar 03/15/2016   HTN (hypertension) 03/15/2016   Obesity 03/15/2016   Elevated cholesterol 03/15/2016   Neuralgia 03/15/2016   Malignant melanoma of right side of neck ([place]) 03/15/2016   BBB (bundle branch block) 03/15/2016   High risk medication use 03/15/2016   Osteoarthritis of lumbar spine 03/15/2016   Primary osteoarthritis of both hands 03/15/2016   Primary osteoarthritis of both knees 03/15/2016    Mesenteric adenitis 06/29/2015    PCP: Orpah Melter, MD   REFERRING PROVIDER: Mcarthur Rossetti, MD  REFERRING DIAG: [phone] (ICD-10-CM) - Status post arthroscopy of right shoulder  THERAPY DIAG:  Chronic right shoulder pain  Stiffness of right shoulder, not elsewhere classified  Abnormal posture  Muscle weakness (generalized)  Cramp and spasm  Localized edema  RATIONALE FOR EVALUATION AND TREATMENT: Rehabilitation  ONSET DATE: ~12/08/21 - R shoulder arthroscopy with debridement and subacromial decompression  NEXT MD VISIT: 04/24/2022   SUBJECTIVE:                                                                                                                                                                                      SUBJECTIVE STATEMENT: Pt reports having lower back pain today  PAIN: Are you having pain? Yes: NPRS scale: 0/10 Pain location: R shoulder Pain description: tightness Aggravating factors: movement Relieving factors: rest  PERTINENT HISTORY: R shoulder scope 11/2021; recent R olecranon bursitis and R UE cellulitis since R shoulder scope; ACDF 02/26/20; R radical neck dissection 2 melanoma 1984; B CTR 2014; B TKA 12/05/10; OA - lumbar spine, B hands, B knees; cervical DJD; lumbar DDD; RA; HTN; BBB; neuralgia; obesity  PRECAUTIONS: None  WEIGHT BEARING RESTRICTIONS: No  FALLS:  Has patient fallen in last 6 months? Yes. Number of falls 1 - landing on R shoulder  LIVING ENVIRONMENT: Lives with: lives with their spouse Lives in: House/apartment Stairs: Yes: Internal: 15 steps; on left going up and External: 3 steps; none Has following equipment at home: None  OCCUPATION: Retired  PLOF: Independent and Leisure: work around [employer], yard work, Development worker, international aid, remodeling  PATIENT GOALS: "Pain-free shoulder."   OBJECTIVE:   DIAGNOSTIC FINDINGS:  01/04/22 - R shoulder x-ray:  FINDINGS: There is no evidence of fracture or  dislocation. Mild degenerative changes are present at the acromioclavicular and glenohumeral joints. Soft tissues are unremarkable. IMPRESSION: No acute osseous abnormality. 11/12/20 - R shoulder x-ray: No  glenohumeral joint space narrowing was noted.  Acromioclavicular narrowing was noted.  No chondrocalcinosis was noted.  Impression: These findings is consistent with acromioclavicular arthritis.  PATIENT SURVEYS:  Quick Dash 43.2 / 100 = 43.2%  COGNITION: Overall cognitive status: Within functional limits for tasks assessed     SENSATION: WFL  POSTURE: rounded shoulders, forward head, and depressed/protracted R shoulder s/p radical neck dissection 2 melanoma  UPPER EXTREMITY ROM:   Active ROM Right * Eval seated Left Eval seated Right 03/14/22 Right  03/21/22  Shoulder flexion 95 158 115 - mild pain 125  Shoulder extension 40 51    Shoulder abduction 78 171 80 - mild pain 115  Shoulder adduction      Shoulder internal rotation FIR to L3 FIR to T12    Shoulder external rotation Unable to FER FER to C6  49 sitting  Elbow flexion      Elbow extension      Wrist flexion      Wrist extension      Wrist ulnar deviation      Wrist radial deviation      Wrist pronation      Wrist supination       * Pain with all R shoulder AROM as well as pronation/supination  Passive ROM Right * Eval supine  Shoulder flexion 151  Shoulder extension   Shoulder abduction 143  Shoulder adduction   Shoulder internal rotation 69  at 45 ABD  Shoulder external rotation 80  at 45 ABD  (Blank rows = not tested) * Pain at end ROM  UPPER EXTREMITY MMT:  MMT Right * eval Left eval Right  03/21/22 Right 04/12/22  Shoulder flexion 3+ 5 4 ** 4+  Shoulder extension 4 5 4+   Shoulder abduction 3- 5 4 ** 4+  Shoulder adduction      Shoulder internal rotation 4 5 4+ 4+  Shoulder external rotation 3- 5 3+ * 4- just a strain  Middle trapezius      Lower trapezius      Elbow flexion       Elbow extension      Wrist flexion      Wrist extension      Wrist ulnar deviation      Wrist radial deviation      Wrist pronation      Wrist supination      Grip strength (lbs)      (Blank rows = not tested) * pain with all resisted motions, ** pain in biceps  SHOULDER SPECIAL TESTS: Impingement tests: Neer impingement test: positive   PALPATION:  TTP t/o R shoulder complex, esp pecs, deltoids, teres group, biceps   TODAY'S TREATMENT:  04/12/22 THERAPEUTIC EXERCISE: to improve flexibility, strength and mobility.  Verbal and tactile cues throughout for technique. UBE 3.0 2 min fwd + 2 min back Assessed strength and QUICK Dash Rows with GTB 2x10 Shld extensions GTB 2x10 R shld ER RTB 2x10 AAROM ER with wand 10x5"  04/06/22 THERAPEUTIC EXERCISE: to improve flexibility, strength and mobility.  Verbal and tactile cues throughout for technique. UBE 3.5 2 min fwd + 2 min back Pulley 3 min flexion/8mn abduction Horiz ABD GTB 2x10 standing in doorframe Serratus slides red TB 2x10 Wall push ups 2x10 STM to bil cervical occipitals and TPR  04/06/22 THERAPEUTIC EXERCISE: to improve flexibility, strength and mobility.  Verbal and tactile cues throughout for technique. UBE 3 min fwd + 3 min back Standing leaning prone on green ball  Ys, Ts, Is, Ws 1x10 each- cues for scapular retraction throughout movement, increased difficulty with W and I position on R shoulder due to increased pain and decreased ROM Standing Scapular Retraction + Row GTB in door frame 2x10 Standing Scapular Retraction + Shoulder Extension in door frame GTB 2x10 Wall push up plus 2x10, additional set of 1x15 with full push up including the plus- full push up with plus limited due to pain in anterior deltoid Wall Circle CW +CCW with ball shoulder flexion at 90 degrees 2x10 S/L R shoulder ER 2x10- cues for ER instead of compensated with elbow extension S/L R shoulder ABD 1x10 S/L R shoulder hori ABD 1x10- cues for  direction of motion and shoulder depression S/L R shoulder flexion/extension 1x5 deferred due to increased pain in anterior shoulder  MANUAL THERAPY: To promote normalized muscle tension and reduced pain. TrP release of infraspinatus   04/03/22 THERAPEUTIC EXERCISE: to improve flexibility, strength and mobility.  Verbal and tactile cues throughout for technique. UBE: L2.0 x 4 min (2' fwd & 2' back) S/L L shld abduction 2x10- cues to keep elbow neutral to avoid strain on shoulder S/L L shld flexion x 15 - cues to keep shoulder depressed S/L L shld ER  x 15  Prone shoulder extension 1lb weight x 20 Prone shoulder flexion x 10; scaption x 10 1lb   03/30/22 THERAPEUTIC EXERCISE: to improve flexibility, strength and mobility.  Verbal and tactile cues throughout for technique. UBE: L2.0 x 7 min (4' fwd & 3' back) Standing leaning prone over green Pball - I's, T's, Y's & W's 10 x 3" each Standing GTB rows with emphasis on scap retraction 10 x 3" - repeated cues to avoid shoulder shrug or excessive shoulder extension/rounding of shoulders  Standing GTB shoulder extension with emphasis on scap retraction 10 x 3"  Standing GTB "W" rows with emphasis on scap retraction 10 x 3"  Wall push-ups x 10 Serratus wall clocks attempted with looped RTB at wrists - deferred d/t limited control and increased pain Serratus roll-up attempted with looped RTB at wrists and forearms on FR - deferred d/t limited control and increased pain Wall push-up plus x10 ("plus" only) Sidelying:   R shoulder ER 10 x 3" - VC & TC for scap retraction and depression    R shoulder ABD 0-90 x 10 - VC & TC for humeral head depression    R shoulder horiz ABD x 10   R shoulder short arc flexion/extension from 90 flexion x 10, focusing on scapular engagement and maintaining arm parallel to mat table   R shoulder rhythmic stabilization at 90 ABD - 2 x 30 sec- 1st set with perturbations at elbow, 2nd set at wrist    PATIENT  EDUCATION: Education details:  awareness of scapular engagement Person educated: Patient Education method: Explanation Education comprehension: verbalized understanding   HOME EXERCISE PROGRAM: Access Code: [email or web address] URL: [email or web address] Date: 04/12/2022 Prepared by: Clarene Essex  Exercises - Sidelying Shoulder Flexion 15 Degrees  - 1 x daily - 3-4 x weekly - 3 sets - 10 reps - Sidelying Shoulder Abduction Palm Forward  - 1 x daily - 3-4 x weekly - 3 sets - 10 reps - Sidelying Shoulder External Rotation  - 1 x daily - 3-4 x weekly - 3 sets - 10 reps - Seated Shoulder External Rotation AAROM with Cane and Hand in Neutral  - 1 x daily - 7 x weekly - 3 sets - 10 reps - Standing Bilateral Low  Shoulder Row with Anchored Resistance  - 1 x daily - 3-4 x weekly - 3 sets - 10 reps - Shoulder Extension with Resistance  - 1 x daily - 3-4 x weekly - 3 sets - 10 reps - Shoulder External Rotation with Anchored Resistance  - 1 x daily - 3-4 x weekly - 3 sets - 10 reps   ASSESSMENT:  CLINICAL IMPRESSION: Mr. Braithwaite showed good improvement with R shoulder strength, still weak in R ER muscles. QUICK Dash score was 9.1% - met LTG #5. Reviewed HEP while providing instructions to correct form and progression as needed. He has made great progress and feels ready to transition to transition to HEP.  OBJECTIVE IMPAIRMENTS decreased ROM, decreased strength, increased edema, increased fascial restrictions, impaired perceived functional ability, increased muscle spasms, impaired flexibility, impaired UE functional use, improper body mechanics, postural dysfunction, and pain.   ACTIVITY LIMITATIONS carrying, lifting, sleeping, bed mobility, bathing, dressing, reach over head, hygiene/grooming, and caring for others  PARTICIPATION LIMITATIONS: meal prep, cleaning, laundry, driving, and yard work  PERSONAL FACTORS Age, Past/current experiences, Time since onset of injury/illness/exacerbation,  and 3+ comorbidities: recent R olecranon bursitis and R UE cellulitis since R shoulder scope; ACDF 02/26/20; R radical neck dissection 2 melanoma 1984; B CTR 2014; B TKA 12/05/10; OA - lumbar spine, B hands, B knees; cervical DJD; lumbar DDD; RA; HTN; BBB; neuralgia; obesity  are also affecting patient's functional outcome.   REHAB POTENTIAL: Good  CLINICAL DECISION MAKING: Evolving/moderate complexity  EVALUATION COMPLEXITY: Moderate   GOALS: Goals reviewed with patient? Yes  SHORT TERM GOALS: Target date: 03/21/2022   Patient will be independent with initial HEP to improve outcomes and carryover.  Baseline:  Goal status: MET - 03/21/22  2.  Patient will improve R shoulder flexion and abduction to >90 w/o pain provocation.  Baseline:  Goal status: MET - 03/21/22  3.  Patient to demonstrate improved upright posture with posterior shoulder girdle engaged to promote improved glenohumeral joint mobility. Baseline:  Goal status: MET - 03/21/22   LONG TERM GOALS: Target date: 04/18/2022   Patient will be independent with ongoing/advanced HEP for self-management at home.  Baseline:  Goal status: IN PROGRESS  2.  Patient will report 75% improvement in R shoulder/UE pain to improve QOL.  Baseline: up to 9/10 at worst Goal status: IN PROGRESS  03/21/22 - 30% reduction in pain  3.  Patient to improve R shoulder AROM to [facility] without pain provocation to allow for increased ease of ADLs.  Baseline:  Goal status: IN PROGRESS  4.  Patient will demonstrate improved R strength to >/= 4 to 4+/5 for functional UE use. Baseline:  Goal status: IN PROGRESS  5  Patient will report </= 33% on QuickDASH to demonstrate improved functional ability.  Baseline: 43.2 / 100 = 43.2% Goal status: MET - 04/12/22   PLAN: PT FREQUENCY: 2x/week  PT DURATION: 8 weeks  PLANNED INTERVENTIONS: Therapeutic exercises, Therapeutic activity, Neuromuscular re-education, Patient/Family education, Self Care,  Joint mobilization, Dry Needling, Electrical stimulation, Cryotherapy, Moist heat, scar mobilization, Vasopneumatic device, Ultrasound, Ionotophoresis 56m/ml Dexamethasone, Manual therapy, and Re-evaluation  PLAN FOR NEXT SESSION: Progress scapular stabilization/postural strengthening; work on pain free AA/AROM of L shoulder & RTC strengthening; MT +/- DN to address pain and abnormal muscle tension   Damarion Mendizabal L Roisin Mones, PTA 04/12/2022, 10:20 AM

## 2022-04-17 ENCOUNTER — Encounter: Payer: Self-pay | Admitting: Physical Therapy

## 2022-04-17 ENCOUNTER — Ambulatory Visit: Payer: Medicare Other | Admitting: Physical Therapy

## 2022-04-17 DIAGNOSIS — R252 Cramp and spasm: Secondary | ICD-10-CM

## 2022-04-17 DIAGNOSIS — M6281 Muscle weakness (generalized): Secondary | ICD-10-CM | POA: Diagnosis not present

## 2022-04-17 DIAGNOSIS — M25611 Stiffness of right shoulder, not elsewhere classified: Secondary | ICD-10-CM | POA: Diagnosis not present

## 2022-04-17 DIAGNOSIS — R293 Abnormal posture: Secondary | ICD-10-CM

## 2022-04-17 DIAGNOSIS — R600 Localized edema: Secondary | ICD-10-CM

## 2022-04-17 DIAGNOSIS — G8929 Other chronic pain: Secondary | ICD-10-CM

## 2022-04-17 DIAGNOSIS — M25511 Pain in right shoulder: Secondary | ICD-10-CM | POA: Diagnosis not present

## 2022-04-17 NOTE — Therapy (Addendum)
OUTPATIENT PHYSICAL THERAPY TREATMENT / PROGRESS NOTE / DISCHARGE SUMMARY   Patient Name: David [name] MRN: [id number] DOB:[birth year], 71 y.o., male Today's Date: 04/17/2022   Progress Note  Reporting Period 03/21/2022 to 04/17/2022  See note below for Objective Data and Assessment of Progress/Goals.     PT End of Session - 04/17/22 0937     Visit Number 12    Date for PT Re-Evaluation 04/18/22    Authorization Type BCBS Medicare    Progress Note Due on Visit --    PT Start Time [id number]   Pt arrived late   PT Stop Time 1009    PT Time Calculation (min) 32 min    Activity Tolerance Patient tolerated treatment well    Behavior During Therapy [facility] for tasks assessed/performed                  Past Medical History:  Diagnosis Date   Arthritis    BBB (bundle branch block) 03/15/2016   Carpal tunnel syndrome    DDD (degenerative disc disease), lumbar 03/15/2016   Depression    Dysrhythmia    had some tachycardia with steroids   Elevated cholesterol 03/15/2016   Headache(784.0)    Ocular Migraines - takes Ambien for it   Heart murmur    HTN (hypertension) 03/15/2016   Hyperlipemia    Hypertension    Malignant melanoma of right side of neck ([place]) 03/15/2016   30 years ago    Neuralgia 03/15/2016   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    RLS (restless legs syndrome)    Sleep apnea    could not use a cpap   Snores    Status post gastric banding    Wears glasses    driving   Past Surgical History:  Procedure Laterality Date   ANTERIOR CERVICAL DECOMPRESSION/DISCECTOMY FUSION 4 LEVELS N/A 02/26/2020   Procedure: ANTERIOR CERVICAL DECOMPRESSION/DISCECTOMY FUSION, INTERBODY PROSTHESIS, PLATE/SCREWS CERVICAL THREE-FOUR CERVICAL FOUR-FIVE CERVICAL FIVE-SIX- CERVICAL SIX-SEVEN;  Surgeon: Newman Pies, MD;  Location: [place];  Service: Neurosurgery;  Laterality: N/A;   CARPAL TUNNEL RELEASE  Right 09/26/2012   Procedure: CARPAL TUNNEL RELEASE;  Surgeon: Tennis Must, MD;  Location: [place];  Service: Orthopedics;  Laterality: Right;   CARPAL TUNNEL RELEASE Left 10/24/2012   Procedure: CARPAL TUNNEL RELEASE;  Surgeon: Tennis Must, MD;  Location: [place];  Service: Orthopedics;  Laterality: Left;   EXCISION MELANOMA WITH SENTINEL LYMPH NODE BIOPSY  1984   rt neck with mulpipal nodes and some muscle excised rt neck-shoulder   KNEE ARTHROPLASTY     knee replacement     LAPAROSCOPIC GASTRIC BANDING  03/15/2009   LAPAROSCOPIC GASTRIC BANDING     MUSCLE BIOPSY  2004   rt thigh to r/o myocitis   nerve conduction velosity test     SHOULDER SURGERY Right 12/2021   TONSILLECTOMY     TOTAL KNEE ARTHROPLASTY Bilateral 12/05/2010   TRIGGER FINGER RELEASE Right 09/26/2012   Procedure: RELEASE TRIGGER FINGER/A-1 PULLEY RING AND SMALL ;  Surgeon: Tennis Must, MD;  Location: [place];  Service: Orthopedics;  Laterality: Right;   TRIGGER FINGER RELEASE Left 10/24/2012   Procedure: RELEASE TRIGGER FINGER/A-1 PULLEY LEFT MIDDLE AND LEFT RING;  Surgeon: Tennis Must, MD;  Location: [place];  Service: Orthopedics;  Laterality: Left;   UPPER GI ENDOSCOPY  Patient Active Problem List   Diagnosis Date Noted   Cervical spondylosis with radiculopathy 02/26/2020   SBO (small bowel obstruction) ([place]) 08/21/2017   Trochanteric bursitis of left hip 01/18/2017   Lateral epicondylitis, right elbow 01/18/2017   Rheumatoid arthritis ([place]) 03/15/2016   DJD (degenerative joint disease), cervical 03/15/2016   DDD (degenerative disc disease), lumbar 03/15/2016   HTN (hypertension) 03/15/2016   Obesity 03/15/2016   Elevated cholesterol 03/15/2016   Neuralgia 03/15/2016   Malignant melanoma of right side of neck ([place]) 03/15/2016   BBB (bundle branch block) 03/15/2016   High risk medication use 03/15/2016   Osteoarthritis of  lumbar spine 03/15/2016   Primary osteoarthritis of both hands 03/15/2016   Primary osteoarthritis of both knees 03/15/2016   Mesenteric adenitis 06/29/2015    PCP: Orpah Melter, MD   REFERRING PROVIDER: Mcarthur Rossetti, MD  REFERRING DIAG: [phone] (ICD-10-CM) - Status post arthroscopy of right shoulder  THERAPY DIAG:  Chronic right shoulder pain  Stiffness of right shoulder, not elsewhere classified  Abnormal posture  Muscle weakness (generalized)  Cramp and spasm  Localized edema  RATIONALE FOR EVALUATION AND TREATMENT: Rehabilitation  ONSET DATE: ~12/08/21 - R shoulder arthroscopy with debridement and subacromial decompression  NEXT MD VISIT: 04/24/2022   SUBJECTIVE:                                                                                                                                                                                      SUBJECTIVE STATEMENT: Pt reports increased soreness after doing yard work (raking, blowing leaves) and cleaning the pool yesterday. Now able to sleep on his R side and complete normal daily activities but feels like his strength is not full recovered.  PAIN: Are you having pain? Yes: NPRS scale: 2.5/10 Pain location: R shoulder Pain description: soreness with some point tenderness Aggravating factors: increased work around the house Relieving factors: rest  PERTINENT HISTORY: R shoulder scope 11/2021; recent R olecranon bursitis and R UE cellulitis since R shoulder scope; ACDF 02/26/20; R radical neck dissection 2 melanoma 1984; B CTR 2014; B TKA 12/05/10; OA - lumbar spine, B hands, B knees; cervical DJD; lumbar DDD; RA; HTN; BBB; neuralgia; obesity  PRECAUTIONS: None  WEIGHT BEARING RESTRICTIONS: No  FALLS:  Has patient fallen in last 6 months? Yes. Number of falls 1 - landing on R shoulder  LIVING ENVIRONMENT: Lives with: lives with their spouse Lives in: House/apartment Stairs: Yes: Internal: 15 steps; on  left going up and External: 3 steps; none Has following equipment at home: None  OCCUPATION: Retired  PLOF: Independent and Leisure: work around [employer], yard work, Development worker, international aid, remodeling  PATIENT GOALS: "Pain-free shoulder."   OBJECTIVE:   DIAGNOSTIC FINDINGS:  01/04/22 - R shoulder x-ray:  FINDINGS: There is no evidence of fracture or dislocation. Mild degenerative changes are present at the acromioclavicular and glenohumeral joints. Soft tissues are unremarkable. IMPRESSION: No acute osseous abnormality. 11/12/20 - R shoulder x-ray: No glenohumeral joint space narrowing was noted.  Acromioclavicular narrowing was noted.  No chondrocalcinosis was noted.  Impression: These findings is consistent with acromioclavicular arthritis.  PATIENT SURVEYS:  Quick Dash 43.2 / 100 = 43.2%  COGNITION: Overall cognitive status: Within functional limits for tasks assessed     SENSATION: WFL  POSTURE: rounded shoulders, forward head, and depressed/protracted R shoulder s/p radical neck dissection 2 melanoma  UPPER EXTREMITY ROM:   Active ROM Right * Eval seated Left Eval seated Right 03/14/22 Right  03/21/22 Right 04/17/22  Shoulder flexion 95 158 115 - mild pain 125 126  Shoulder extension 40 51   57  Shoulder abduction 78 171 80 - mild pain 115 112  Shoulder adduction       Shoulder internal rotation FIR to L3 FIR to T12   76 supine  Shoulder external rotation Unable to FER FER to C6  49 sitting 36 sitting 71 supine  Elbow flexion       Elbow extension       Wrist flexion       Wrist extension       Wrist ulnar deviation       Wrist radial deviation       Wrist pronation       Wrist supination        * Pain with all R shoulder AROM as well as pronation/supination  Passive ROM Right * Eval supine  Shoulder flexion 151  Shoulder extension   Shoulder abduction 143  Shoulder adduction   Shoulder internal rotation 69  at 45 ABD  Shoulder external rotation  80  at 45 ABD  (Blank rows = not tested) * Pain at end ROM  UPPER EXTREMITY MMT:  MMT Right * eval Left eval Right  03/21/22 Right 04/12/22 Right 04/17/22  Shoulder flexion 3+ 5 4 ** 4+ 4+  Shoulder extension 4 5 4+  5  Shoulder abduction 3- 5 4 ** 4+ 4+  Shoulder adduction       Shoulder internal rotation 4 5 4+ 4+ 4+  Shoulder external rotation 3- 5 3+ * 4- just a strain 4-  Middle trapezius       Lower trapezius       Elbow flexion       Elbow extension       Wrist flexion       Wrist extension       Wrist ulnar deviation       Wrist radial deviation       Wrist pronation       Wrist supination       Grip strength (lbs)       (Blank rows = not tested) * pain with all resisted motions, ** pain in biceps  SHOULDER SPECIAL TESTS: Impingement tests: Neer impingement test: positive   PALPATION:  TTP t/o R shoulder complex, esp pecs, deltoids, teres group, biceps   TODAY'S TREATMENT:   04/17/22 THERAPEUTIC EXERCISE: to improve flexibility, strength and mobility.  Verbal and tactile cues throughout for technique. UBE - L3.0 x 6 min (3' each fwd & back) HEP review RTB R shoulder ER with towel roll under arm 10 x 3", 2  sets  THERAPEUTIC ACTIVITIES: Shoulder ROM & MMT Goal assessment   04/12/22 THERAPEUTIC EXERCISE: to improve flexibility, strength and mobility.  Verbal and tactile cues throughout for technique. UBE 3.0 2 min fwd + 2 min back Assessed strength and QUICK Dash = 9.1% Rows with GTB 2x10 Shld extensions GTB 2x10 R shld ER RTB 2x10 AAROM ER with wand 10x5"   04/10/22 THERAPEUTIC EXERCISE: to improve flexibility, strength and mobility.  Verbal and tactile cues throughout for technique. UBE 3.5 2 min fwd + 2 min back Pulley 3 min flexion/31min abduction Horiz ABD GTB 2x10 standing in doorframe Serratus slides red TB 2x10 Wall push ups 2x10 STM to bil cervical occipitals and TPR   PATIENT EDUCATION: Education details: recommended frequency for  ongoing HEP at discharge to prevent loss of gains achieved with PT Person educated: Patient Education method: Explanation Education comprehension: verbalized understanding   HOME EXERCISE PROGRAM: Access Code: [email or web address] URL: [email or web address] Date: 04/12/2022 Prepared by: Clarene Essex  Exercises - Sidelying Shoulder Flexion 15 Degrees  - 1 x daily - 3-4 x weekly - 3 sets - 10 reps - Sidelying Shoulder Abduction Palm Forward  - 1 x daily - 3-4 x weekly - 3 sets - 10 reps - Sidelying Shoulder External Rotation  - 1 x daily - 3-4 x weekly - 3 sets - 10 reps - Seated Shoulder External Rotation AAROM with Cane and Hand in Neutral  - 1 x daily - 7 x weekly - 3 sets - 10 reps - Standing Bilateral Low Shoulder Row with Anchored Resistance  - 1 x daily - 3-4 x weekly - 3 sets - 10 reps - Shoulder Extension with Resistance  - 1 x daily - 3-4 x weekly - 3 sets - 10 reps - Shoulder External Rotation with Anchored Resistance  - 1 x daily - 3-4 x weekly - 3 sets - 10 reps   ASSESSMENT:  CLINICAL IMPRESSION: Avelardo reports 90% reduction in right shoulder pain allowing him to sleep on his right side without limitation due to pain and good functional use of right arm with normal daily tasks, although he does note some continued weakness.  His right shoulder ROM is now [facility] and essentially back to his baseline following his radical neck dissection.  Overall right shoulder strength is now 4+/5 with only exception being external rotation at 4-/5.  He feels confident with his HEP with only minor review necessary for external rotation strengthening exercises.  All goals now met with exception of R shoulder ER MMT only 4-/5.  Gregrey is in agreement with plan to transition to home exercise program at this time, but will remain on hold for 30 days in the event that issues arise that necessitate a return to physical therapy.  OBJECTIVE IMPAIRMENTS decreased ROM, decreased strength, increased edema,  increased fascial restrictions, impaired perceived functional ability, increased muscle spasms, impaired flexibility, impaired UE functional use, improper body mechanics, postural dysfunction, and pain.   ACTIVITY LIMITATIONS carrying, lifting, sleeping, bed mobility, bathing, dressing, reach over head, hygiene/grooming, and caring for others  PARTICIPATION LIMITATIONS: meal prep, cleaning, laundry, driving, and yard work  PERSONAL FACTORS Age, Past/current experiences, Time since onset of injury/illness/exacerbation, and 3+ comorbidities: recent R olecranon bursitis and R UE cellulitis since R shoulder scope; ACDF 02/26/20; R radical neck dissection 2 melanoma 1984; B CTR 2014; B TKA 12/05/10; OA - lumbar spine, B hands, B knees; cervical DJD; lumbar DDD; RA; HTN; BBB; neuralgia; obesity  are also affecting patient's functional outcome.  REHAB POTENTIAL: Good  CLINICAL DECISION MAKING: Evolving/moderate complexity  EVALUATION COMPLEXITY: Moderate   GOALS: Goals reviewed with patient? Yes  SHORT TERM GOALS: Target date: 03/21/2022   Patient will be independent with initial HEP to improve outcomes and carryover.  Baseline:  Goal status: MET - 03/21/22  2.  Patient will improve R shoulder flexion and abduction to >90 w/o pain provocation.  Baseline:  Goal status: MET - 03/21/22  3.  Patient to demonstrate improved upright posture with posterior shoulder girdle engaged to promote improved glenohumeral joint mobility. Baseline:  Goal status: MET - 03/21/22   LONG TERM GOALS: Target date: 04/18/2022   Patient will be independent with ongoing/advanced HEP for self-management at home.  Baseline:  Goal status: MET - 04/17/22  2.  Patient will report 75% improvement in R shoulder/UE pain to improve QOL.  Baseline: up to 9/10 at worst Goal status: MET - 04/17/22 - 90% reduction in pain  3.  Patient to improve R shoulder AROM to [facility] without pain provocation to allow for increased  ease of ADLs.  Baseline:  Goal status: MET - 04/17/22  4.  Patient will demonstrate improved R strength to >/= 4 to 4+/5 for functional UE use. Baseline:  Goal status: PARTIALLY MET - 04/17/22 - Met except ER 4-/5  5  Patient will report </= 33% on QuickDASH to demonstrate improved functional ability.  Baseline: 43.2 / 100 = 43.2% Goal status: MET - 04/12/22 - 9.1%   PLAN: PT FREQUENCY: 2x/week  PT DURATION: 8 weeks  PLANNED INTERVENTIONS: Therapeutic exercises, Therapeutic activity, Neuromuscular re-education, Patient/Family education, Self Care, Joint mobilization, Dry Needling, Electrical stimulation, Cryotherapy, Moist heat, scar mobilization, Vasopneumatic device, Ultrasound, Ionotophoresis 4mg /ml Dexamethasone, Manual therapy, and Re-evaluation  PLAN FOR NEXT SESSION: transition to HEP + 30-day hold   Percival Spanish, PT 04/17/2022, 10:27 AM  PHYSICAL THERAPY DISCHARGE SUMMARY  Visits from Start of Care: 12  Current functional level related to goals / functional outcomes:   Refer to above clinical impression and goal assessment for status as of last visit on 04/17/22. Patient was placed on hold for 30 days and has not needed to return to PT, therefore will proceed with discharge from PT for this episode.     Remaining deficits:   As above.   Education / Equipment:   HEP   Patient agrees to discharge. Patient goals were mostly met. Patient is being discharged due to meeting the stated rehab goals.  Percival Spanish, PT, MPT 08/07/22, 2:28 PM  [facility] [address]  [place], [place], [address] Phone: [phone]   Fax:  [phone]

## 2022-04-24 ENCOUNTER — Encounter: Payer: Self-pay | Admitting: Orthopaedic Surgery

## 2022-04-24 ENCOUNTER — Ambulatory Visit: Payer: Medicare Other | Admitting: Orthopaedic Surgery

## 2022-04-24 DIAGNOSIS — G8929 Other chronic pain: Secondary | ICD-10-CM

## 2022-04-24 DIAGNOSIS — Z9889 Other specified postprocedural states: Secondary | ICD-10-CM | POA: Diagnosis not present

## 2022-04-24 DIAGNOSIS — M25511 Pain in right shoulder: Secondary | ICD-10-CM

## 2022-04-24 NOTE — Progress Notes (Signed)
Mr. Manheim comes in today stating that he is doing much better overall with his right shoulder.  He has been through physical therapy and they have released him.  The injection that we did last time really helped quite a bit.  He has had arthroscopic surgery but then had had a mechanical fall on that shoulder in between.  He says also that the bursitis of his elbow has resolved and we had seen that and resolved earlier as well.  He does have much improved range of motion of his right shoulder.  There is some limitations with external rotation and at 71 years old he wishes he can throw better but overall he is made great improvements and he looks better overall.  At this point follow-up for his right shoulder can be as needed.  If he does have any issues at all he knows to let us know.

## 2022-04-27 DIAGNOSIS — G2581 Restless legs syndrome: Secondary | ICD-10-CM | POA: Diagnosis not present

## 2022-04-27 DIAGNOSIS — G4733 Obstructive sleep apnea (adult) (pediatric): Secondary | ICD-10-CM | POA: Diagnosis not present

## 2022-04-27 DIAGNOSIS — E669 Obesity, unspecified: Secondary | ICD-10-CM | POA: Diagnosis not present

## 2022-04-27 DIAGNOSIS — I10 Essential (primary) hypertension: Secondary | ICD-10-CM | POA: Diagnosis not present

## 2022-05-07 ENCOUNTER — Other Ambulatory Visit: Payer: Self-pay | Admitting: Orthopaedic Surgery

## 2022-05-10 DIAGNOSIS — E782 Mixed hyperlipidemia: Secondary | ICD-10-CM | POA: Diagnosis not present

## 2022-05-10 DIAGNOSIS — I10 Essential (primary) hypertension: Secondary | ICD-10-CM | POA: Diagnosis not present

## 2022-05-10 DIAGNOSIS — M069 Rheumatoid arthritis, unspecified: Secondary | ICD-10-CM | POA: Diagnosis not present

## 2022-05-10 DIAGNOSIS — Z0000 Encounter for general adult medical examination without abnormal findings: Secondary | ICD-10-CM | POA: Diagnosis not present

## 2022-05-10 DIAGNOSIS — N400 Enlarged prostate without lower urinary tract symptoms: Secondary | ICD-10-CM | POA: Diagnosis not present

## 2022-05-17 ENCOUNTER — Encounter (HOSPITAL_BASED_OUTPATIENT_CLINIC_OR_DEPARTMENT_OTHER): Payer: Self-pay | Admitting: Emergency Medicine

## 2022-05-17 ENCOUNTER — Other Ambulatory Visit: Payer: Self-pay

## 2022-05-17 ENCOUNTER — Emergency Department (HOSPITAL_BASED_OUTPATIENT_CLINIC_OR_DEPARTMENT_OTHER)
Admission: EM | Admit: 2022-05-17 | Discharge: 2022-05-17 | Disposition: A | Discharge status: Home / Self Care | Payer: Medicare Other | Attending: Emergency Medicine | Admitting: Emergency Medicine

## 2022-05-17 ENCOUNTER — Emergency Department (HOSPITAL_BASED_OUTPATIENT_CLINIC_OR_DEPARTMENT_OTHER): Payer: Medicare Other

## 2022-05-17 DIAGNOSIS — I10 Essential (primary) hypertension: Secondary | ICD-10-CM | POA: Insufficient documentation

## 2022-05-17 DIAGNOSIS — I771 Stricture of artery: Secondary | ICD-10-CM | POA: Diagnosis not present

## 2022-05-17 DIAGNOSIS — K3189 Other diseases of stomach and duodenum: Secondary | ICD-10-CM | POA: Diagnosis not present

## 2022-05-17 DIAGNOSIS — Z85828 Personal history of other malignant neoplasm of skin: Secondary | ICD-10-CM | POA: Insufficient documentation

## 2022-05-17 DIAGNOSIS — Q438 Other specified congenital malformations of intestine: Secondary | ICD-10-CM | POA: Diagnosis not present

## 2022-05-17 DIAGNOSIS — Z79899 Other long term (current) drug therapy: Secondary | ICD-10-CM | POA: Insufficient documentation

## 2022-05-17 DIAGNOSIS — I878 Other specified disorders of veins: Secondary | ICD-10-CM | POA: Diagnosis not present

## 2022-05-17 DIAGNOSIS — R109 Unspecified abdominal pain: Secondary | ICD-10-CM | POA: Insufficient documentation

## 2022-05-17 DIAGNOSIS — Z96653 Presence of artificial knee joint, bilateral: Secondary | ICD-10-CM | POA: Insufficient documentation

## 2022-05-17 DIAGNOSIS — Z87891 Personal history of nicotine dependence: Secondary | ICD-10-CM | POA: Insufficient documentation

## 2022-05-17 LAB — CBC WITH DIFFERENTIAL/PLATELET
Abs Immature Granulocytes: 0.02 10*3/uL (ref 0.00–0.07)
Basophils Absolute: 0.1 10*3/uL (ref 0.0–0.1)
Basophils Relative: 1 %
Eosinophils Absolute: 0.2 10*3/uL (ref 0.0–0.5)
Eosinophils Relative: 3 %
HCT: 44.4 % (ref 39.0–52.0)
Hemoglobin: 14.8 g/dL (ref 13.0–17.0)
Immature Granulocytes: 0 %
Lymphocytes Relative: 39 %
Lymphs Abs: 2.8 10*3/uL (ref 0.7–4.0)
MCH: 30.6 pg (ref 26.0–34.0)
MCHC: 33.3 g/dL (ref 30.0–36.0)
MCV: 91.7 fL (ref 80.0–100.0)
Monocytes Absolute: 0.8 10*3/uL (ref 0.1–1.0)
Monocytes Relative: 12 %
Neutro Abs: 3.2 10*3/uL (ref 1.7–7.7)
Neutrophils Relative %: 45 %
Platelets: 269 10*3/uL (ref 150–400)
RBC: 4.84 MIL/uL (ref 4.22–5.81)
RDW: 14 % (ref 11.5–15.5)
WBC: 7 10*3/uL (ref 4.0–10.5)
nRBC: 0 % (ref 0.0–0.2)

## 2022-05-17 LAB — BASIC METABOLIC PANEL
Anion gap: 5 (ref 5–15)
BUN: 12 mg/dL (ref 8–23)
CO2: 34 mmol/L — ABNORMAL HIGH (ref 22–32)
Calcium: 9.3 mg/dL (ref 8.9–10.3)
Chloride: 99 mmol/L (ref 98–111)
Creatinine, Ser: 1.26 mg/dL — ABNORMAL HIGH (ref 0.61–1.24)
GFR, Estimated: >60 mL/min (ref >60)
Glucose, Bld: 106 mg/dL — ABNORMAL HIGH (ref 70–99)
Potassium: 3.7 mmol/L (ref 3.5–5.1)
Sodium: 138 mmol/L (ref 135–145)

## 2022-05-17 LAB — URINALYSIS, ROUTINE W REFLEX MICROSCOPIC
Bilirubin Urine: NEGATIVE
Glucose, UA: NEGATIVE mg/dL
Hgb urine dipstick: NEGATIVE
Ketones, ur: NEGATIVE mg/dL
Leukocytes,Ua: NEGATIVE
Nitrite: NEGATIVE
Protein, ur: NEGATIVE mg/dL
Specific Gravity, Urine: 1.015 (ref 1.005–1.030)
pH: 7 (ref 5.0–8.0)

## 2022-05-17 MED ORDER — ONDANSETRON 8 MG PO TBDP: 8.0000 mg | ORAL_TABLET | Freq: Three times a day (TID) | ORAL | 1 refills | Status: DC | PRN

## 2022-05-17 MED ORDER — ONDANSETRON HCL 4 MG/2ML IJ SOLN
4.0000 mg | Freq: Once | INTRAMUSCULAR | Status: AC
  Administered 2022-05-17: 4 mg via INTRAVENOUS
  Filled 2022-05-17: qty 2

## 2022-05-17 MED ORDER — SODIUM CHLORIDE 0.9 % IV BOLUS: 1000.0000 mL | Freq: Once | INTRAVENOUS | Status: DC

## 2022-05-17 MED ORDER — HYDROCODONE-ACETAMINOPHEN 5-325 MG PO TABS: 1.0000 | ORAL_TABLET | ORAL | 0 refills | Status: DC | PRN

## 2022-05-17 NOTE — ED Triage Notes (Signed)
Pt here for constant R side back pain since 12/25 along w/ nausea and dry-heaving. Pt also reports feeling constipated. Pt states today pain got worse and was throbbing, pt took '10mg'$  vicodin earlier this evening and has had some relief. Pt denies urinary symptoms, vomiting, cp, sob.

## 2022-05-17 NOTE — ED Notes (Signed)
Patient transported to CT 

## 2022-05-17 NOTE — ED Provider Notes (Signed)
[facility] Provider Note: Georgena Spurling, MD, FACEP  CSN: [id number] MRN: [id number] ARRIVAL: 05/17/22 at [place]: [place]  Flank Pain   HISTORY OF PRESENT ILLNESS  05/17/22 4:48 AM David [name] is a 71 y.o. male with 2 days of right flank pain.  The flank pain is described as dull, stabbing and constant.  It was an 8 out of 10 until he took a hydrocodone several hours ago and he now rates it as a 6 out of 10.  It is not significantly worse with movement.  He denies any associated urinary symptoms but he has had nausea and retching.  He denies any dysuria or hematuria.  He denies chest pain or shortness of breath.   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    RLS (restless legs syndrome)    Sleep apnea    could not use a cpap   Snores    Status post gastric banding    Wears glasses    driving    Past Surgical History:  Procedure Laterality Date   ANTERIOR CERVICAL DECOMPRESSION/DISCECTOMY FUSION 4 LEVELS N/A 02/26/2020   Procedure: ANTERIOR CERVICAL DECOMPRESSION/DISCECTOMY FUSION, INTERBODY PROSTHESIS, PLATE/SCREWS CERVICAL THREE-FOUR CERVICAL FOUR-FIVE CERVICAL FIVE-SIX- CERVICAL SIX-SEVEN;  Surgeon: Newman Pies, MD;  Location: [facility];  Service: Neurosurgery;  Laterality: N/A;   CARPAL TUNNEL RELEASE Right 09/26/2012   Procedure: CARPAL TUNNEL RELEASE;   Surgeon: Tennis Must, MD;  Location: [facility];  Service: Orthopedics;  Laterality: Right;   CARPAL TUNNEL RELEASE Left 10/24/2012   Procedure: CARPAL TUNNEL RELEASE;  Surgeon: Tennis Must, MD;  Location: [facility];  Service: Orthopedics;  Laterality: Left;   EXCISION MELANOMA WITH SENTINEL LYMPH NODE BIOPSY  1984   rt neck with mulpipal nodes and some muscle excised rt neck-shoulder   KNEE ARTHROPLASTY     knee replacement     LAPAROSCOPIC GASTRIC BANDING  03/15/2009   LAPAROSCOPIC GASTRIC BANDING     MUSCLE BIOPSY  2004   rt thigh to r/o myocitis   nerve conduction velosity test     SHOULDER SURGERY Right 12/2021   TONSILLECTOMY     TOTAL KNEE ARTHROPLASTY Bilateral 12/05/2010   TRIGGER FINGER RELEASE Right 09/26/2012   Procedure: RELEASE TRIGGER FINGER/A-1 PULLEY RING AND SMALL ;  Surgeon: Tennis Must, MD;  Location: [facility];  Service: Orthopedics;  Laterality: Right;   TRIGGER FINGER RELEASE Left 10/24/2012   Procedure: RELEASE TRIGGER FINGER/A-1 PULLEY LEFT MIDDLE AND LEFT RING;  Surgeon: Tennis Must, MD;  Location: [facility];  Service: Orthopedics;  Laterality: Left;   UPPER GI ENDOSCOPY      Family History  Problem Relation Age of Onset   Heart disease Mother  Alzheimer's disease Father     Social History   Tobacco Use   Smoking status: Former    Packs/day: 1.00    Years: 30.00    Total pack years: 30.00    Types: Cigarettes    Quit date: 09/23/2001    Years since quitting: 20.6    Passive exposure: Never   Smokeless tobacco: Never  Vaping Use   Vaping Use: Never used  Substance Use Topics   Alcohol use: Not Currently    Comment: rare 2-3 per year   Drug use: No    Prior to Admission medications   Medication Sig Start Date End Date Taking? Authorizing Provider  ondansetron (ZOFRAN-ODT) 8 MG disintegrating tablet Take 1 tablet (8 mg total) by mouth every 8 (eight) hours as needed for  nausea or vomiting. 05/17/22  Yes Brynn Reznik, MD  celecoxib (CELEBREX) 200 MG capsule TAKE 1 CAPSULE BY MOUTH TWICE A DAY BETWEEN MEALS AS NEEDED 05/08/22   Mcarthur Rossetti, MD  DULoxetine (CYMBALTA) 60 MG capsule Take 60 mg by mouth 2 (two) times daily.     [provider]  folic acid (FOLVITE) 1 MG tablet Take 2 tablets (2 mg total) by mouth daily. 02/08/22   Bo Merino, MD  gabapentin (NEURONTIN) 300 MG capsule TAKE THREE CAPSULES BY MOUTH THREE TIMES A DAY Patient taking differently: Take 600 mg by mouth 2 (two) times daily. 04/05/20   Mcarthur Rossetti, MD  hydrochlorothiazide (HYDRODIURIL) 25 MG tablet Take 25 mg by mouth daily.    [provider]  HYDROcodone-acetaminophen (NORCO/VICODIN) 5-325 MG tablet Take 1-2 tablets by mouth every 4 (four) hours as needed for moderate pain or severe pain. 05/17/22   Rameen Quinney, MD  hydroxychloroquine (PLAQUENIL) 200 MG tablet Take 1 tablet (200 mg total) by mouth 2 (two) times daily. 02/08/22   Bo Merino, MD  methocarbamol (ROBAXIN) 750 MG tablet Take 1 tablet (750 mg total) by mouth every 8 (eight) hours as needed for muscle spasms. 02/08/22   Mcarthur Rossetti, MD  methotrexate 50 MG/2ML injection INJECT 1ML UNDER THE SKIN ONCE WEEKLY 02/08/22   Bo Merino, MD  metoprolol succinate (TOPROL-XL) 25 MG 24 hr tablet Take 25 mg by mouth daily. 11/24/14   [provider]  pantoprazole (PROTONIX) 40 MG tablet Take 40 mg by mouth daily. 08/24/21   [provider]  rOPINIRole (REQUIP) 4 MG tablet Take 4 mg by mouth every evening.     [provider]  simvastatin (ZOCOR) 40 MG tablet Take 40 mg by mouth daily.    [provider]  sulfaSALAzine (AZULFIDINE) 500 MG tablet Take 2 tablets (1,000 mg total) by mouth 2 (two) times daily. 02/08/22   Bo Merino, MD  tamsulosin (FLOMAX) 0.4 MG CAPS capsule Take 0.4 mg by mouth daily.     [provider]  tiZANidine  (ZANAFLEX) 4 MG tablet Take 1 tablet (4 mg total) by mouth every 8 (eight) hours as needed for muscle spasms. 03/10/22   Mcarthur Rossetti, MD  Tuberculin-Allergy Syringes (BD ALLERGIST TRAY) 27G X 1/2" 1 ML KIT USE TO INJECT METHOTREXATE ONCE WEEKLY 01/26/22   Bo Merino, MD  Tuberculin-Allergy Syringes 27G X 1/2" 1 ML MISC Use to inject methotrexate once weekly. 02/08/22   Bo Merino, MD  UNABLE TO FIND daily. Med Name: Aspirin-Tylenol-Caffeine    [provider]    Allergies Codeine, Dilaudid [hydromorphone hcl], Fentanyl, Nucynta [tapentadol], and Penicillins   REVIEW OF SYSTEMS  Negative except  as noted here or in the History of Present Illness.   PHYSICAL EXAMINATION  Initial Vital Signs Blood pressure 128/80, pulse 61, temperature 97.6 F (36.4 C), temperature source Oral, resp. rate 18, SpO2 100 %.  Examination General: Well-developed, well-nourished male in no acute distress; appearance consistent with age of record HENT: normocephalic; atraumatic Eyes: Normal appearance Neck: supple Heart: regular rate and rhythm Lungs: clear to auscultation bilaterally Abdomen: soft; nondistended; nontender; bowel sounds present GU: No CVA tenderness Extremities: No deformity; full range of motion Neurologic: Awake, alert and oriented; motor function intact in all extremities and symmetric; no facial droop Skin: Warm and dry Psychiatric: Normal mood and affect   RESULTS  Summary of this visit's results, reviewed and interpreted by myself:   EKG Interpretation  Date/Time:    Ventricular Rate:    PR Interval:    QRS Duration:   QT Interval:    QTC Calculation:   R Axis:     Text Interpretation:         Laboratory Studies: Results for orders placed or performed during the hospital encounter of 05/17/22 (from the past 24 hour(s))  Urinalysis, Routine w reflex microscopic     Status: None   Collection Time: 05/17/22  5:00 AM  Result Value Ref  Range   Color, Urine YELLOW YELLOW   APPearance CLEAR CLEAR   Specific Gravity, Urine 1.015 1.005 - 1.030   pH 7.0 5.0 - 8.0   Glucose, UA NEGATIVE NEGATIVE mg/dL   Hgb urine dipstick NEGATIVE NEGATIVE   Bilirubin Urine NEGATIVE NEGATIVE   Ketones, ur NEGATIVE NEGATIVE mg/dL   Protein, ur NEGATIVE NEGATIVE mg/dL   Nitrite NEGATIVE NEGATIVE   Leukocytes,Ua NEGATIVE NEGATIVE  CBC with Differential     Status: None   Collection Time: 05/17/22  5:05 AM  Result Value Ref Range   WBC 7.0 4.0 - 10.5 K/uL   RBC 4.84 4.22 - 5.81 MIL/uL   Hemoglobin 14.8 13.0 - 17.0 g/dL   HCT 44.4 39.0 - 52.0 %   MCV 91.7 80.0 - 100.0 fL   MCH 30.6 26.0 - 34.0 pg   MCHC 33.3 30.0 - 36.0 g/dL   RDW 14.0 11.5 - 15.5 %   Platelets 269 150 - 400 K/uL   nRBC 0.0 0.0 - 0.2 %   Neutrophils Relative % 45 %   Neutro Abs 3.2 1.7 - 7.7 K/uL   Lymphocytes Relative 39 %   Lymphs Abs 2.8 0.7 - 4.0 K/uL   Monocytes Relative 12 %   Monocytes Absolute 0.8 0.1 - 1.0 K/uL   Eosinophils Relative 3 %   Eosinophils Absolute 0.2 0.0 - 0.5 K/uL   Basophils Relative 1 %   Basophils Absolute 0.1 0.0 - 0.1 K/uL   Immature Granulocytes 0 %   Abs Immature Granulocytes 0.02 0.00 - 0.07 K/uL  Basic metabolic panel     Status: Abnormal   Collection Time: 05/17/22  5:05 AM  Result Value Ref Range   Sodium 138 135 - 145 mmol/L   Potassium 3.7 3.5 - 5.1 mmol/L   Chloride 99 98 - 111 mmol/L   CO2 34 (H) 22 - 32 mmol/L   Glucose, Bld 106 (H) 70 - 99 mg/dL   BUN 12 8 - 23 mg/dL   Creatinine, Ser 1.26 (H) 0.61 - 1.24 mg/dL   Calcium 9.3 8.9 - 10.3 mg/dL   GFR, Estimated >60 >60 mL/min   Anion gap 5 5 - 15   Imaging Studies: CT Renal  Stone Study  Result Date: 05/17/2022 CLINICAL DATA:  71 year old male with persistent right back/flank pain since Christmas. Nausea, dry heaving. EXAM: CT ABDOMEN AND PELVIS WITHOUT CONTRAST TECHNIQUE: Multidetector CT imaging of the abdomen and pelvis was performed following the standard protocol  without IV contrast. RADIATION DOSE REDUCTION: This exam was performed according to the departmental dose-optimization program which includes automated exposure control, adjustment of the mA and/or kV according to patient size and/or use of iterative reconstruction technique. COMPARISON:  CT Abdomen and Pelvis 08/21/2017. FINDINGS: Lower chest: Stable tortuosity of the descending thoracic aorta. No cardiomegaly or pericardial effusion. Stable lung bases with mild linear scarring on the left. No pleural effusion. Hepatobiliary: Chronic hyperdense sludge or stones, but no pericholecystic inflammation. Negative visible noncontrast liver. Pancreas: Negative. Spleen: Negative. Adrenals/Urinary Tract: Normal adrenal glands. Negative noncontrast kidneys, no nephrolithiasis. Decompressed ureters and bladder. Stomach/Bowel: Redundant large bowel with mild retained stool. Normal appendix series 2, image 53. The cecum appears to beyond a lax mesentery. Decompressed and negative terminal ileum. No dilated small bowel. Chronic gastric banding appears stable since 2019. Distal stomach and duodenum appear negative. No free air, free fluid, or mesenteric inflammation identified. Vascular/Lymphatic: Aortoiliac calcified atherosclerosis. Mild large vessel tortuosity. No lymphadenopathy identified. Reproductive: Negative. Other: No pelvis free fluid.  Occasional pelvic phleboliths. Musculoskeletal: Advanced disc and endplate degeneration in the spine. Some levels of chronic lower thoracic ankylosis from bulky flowing endplate osteophytes. Progressed lumbar vacuum disc and endplate degeneration since 2019. No acute osseous abnormality identified. IMPRESSION: 1. Chronic gallstones and/or sludge. No CT evidence of acute cholecystitis. 2. No other acute or inflammatory process identified in the noncontrast abdomen or pelvis. No urinary calculus or obstructive uropathy. Normal appendix. Chronic gastric banding. 3. Advanced lumbar spine  degeneration, progressed since 2019. 4.  Aortic Atherosclerosis (ICD10-I70.0). Electronically Signed   By: Genevie Ann M.D.   On: 05/17/2022 06:14    ED COURSE and MDM  Nursing notes, initial and subsequent vitals signs, including pulse oximetry, reviewed and interpreted by myself.  Vitals:   05/17/22 0447 05/17/22 0448  BP: 128/80   Pulse: 61   Resp: 18   Temp: 97.6 F (36.4 C)   TempSrc: Oral   SpO2: 100%   Weight:  118.8 kg  Height:  _0  (1.727 m)   Medications  ondansetron (ZOFRAN) injection 4 mg (4 mg Intravenous Given 05/17/22 0510)   5:23 AM The patient's CT appears to show a subtle radiolucency in the distal right ureter just proximal to the UVJ.  This could represent a small ureteral stone, although he has no microscopic hematuria and no hydroureter or hydronephrosis are seen.  6:21 AM The patient is not tender over his gallbladder so the gallstones and/or sludge seen on CT scan do not appear to be the cause of his pain.  The radiologist was not able to see a ureteral stone and what I saw is, as noted, equivocal.  We will treat his pain and have him follow-up with his PCP or return to the ED if symptoms are uncontrolled.   PROCEDURES  Procedures   ED DIAGNOSES     ICD-10-CM   1. Right flank pain  R10.9          Derold Dorsch, Jenny Reichmann, MD 05/17/22 [phone]

## 2022-05-18 ENCOUNTER — Other Ambulatory Visit: Payer: Self-pay | Admitting: Rheumatology

## 2022-05-18 DIAGNOSIS — M0609 Rheumatoid arthritis without rheumatoid factor, multiple sites: Secondary | ICD-10-CM

## 2022-05-18 DIAGNOSIS — Z79899 Other long term (current) drug therapy: Secondary | ICD-10-CM

## 2022-05-25 DIAGNOSIS — G2581 Restless legs syndrome: Secondary | ICD-10-CM | POA: Diagnosis not present

## 2022-05-25 DIAGNOSIS — G4733 Obstructive sleep apnea (adult) (pediatric): Secondary | ICD-10-CM | POA: Diagnosis not present

## 2022-05-25 DIAGNOSIS — I10 Essential (primary) hypertension: Secondary | ICD-10-CM | POA: Diagnosis not present

## 2022-05-31 DIAGNOSIS — H25813 Combined forms of age-related cataract, bilateral: Secondary | ICD-10-CM | POA: Diagnosis not present

## 2022-05-31 DIAGNOSIS — H5213 Myopia, bilateral: Secondary | ICD-10-CM | POA: Diagnosis not present

## 2022-05-31 DIAGNOSIS — H52223 Regular astigmatism, bilateral: Secondary | ICD-10-CM | POA: Diagnosis not present

## 2022-05-31 DIAGNOSIS — H524 Presbyopia: Secondary | ICD-10-CM | POA: Diagnosis not present

## 2022-05-31 DIAGNOSIS — H43811 Vitreous degeneration, right eye: Secondary | ICD-10-CM | POA: Diagnosis not present

## 2022-05-31 DIAGNOSIS — H35371 Puckering of macula, right eye: Secondary | ICD-10-CM | POA: Diagnosis not present

## 2022-06-08 DIAGNOSIS — D84821 Immunodeficiency due to drugs: Secondary | ICD-10-CM | POA: Diagnosis not present

## 2022-06-08 DIAGNOSIS — R413 Other amnesia: Secondary | ICD-10-CM | POA: Diagnosis not present

## 2022-06-08 DIAGNOSIS — M069 Rheumatoid arthritis, unspecified: Secondary | ICD-10-CM | POA: Diagnosis not present

## 2022-06-19 ENCOUNTER — Other Ambulatory Visit (HOSPITAL_BASED_OUTPATIENT_CLINIC_OR_DEPARTMENT_OTHER): Payer: Self-pay | Admitting: Family Medicine

## 2022-06-19 DIAGNOSIS — E782 Mixed hyperlipidemia: Secondary | ICD-10-CM

## 2022-06-20 ENCOUNTER — Other Ambulatory Visit: Payer: Self-pay | Admitting: Rheumatology

## 2022-06-20 ENCOUNTER — Ambulatory Visit: Payer: Medicare Other | Attending: Family Medicine | Admitting: Physical Therapy

## 2022-06-20 ENCOUNTER — Encounter: Payer: Self-pay | Admitting: Physical Therapy

## 2022-06-20 ENCOUNTER — Other Ambulatory Visit: Payer: Self-pay

## 2022-06-20 DIAGNOSIS — R2681 Unsteadiness on feet: Secondary | ICD-10-CM | POA: Insufficient documentation

## 2022-06-20 DIAGNOSIS — R2689 Other abnormalities of gait and mobility: Secondary | ICD-10-CM | POA: Diagnosis not present

## 2022-06-20 DIAGNOSIS — R262 Difficulty in walking, not elsewhere classified: Secondary | ICD-10-CM | POA: Insufficient documentation

## 2022-06-20 NOTE — Therapy (Signed)
OUTPATIENT PHYSICAL THERAPY LOWER EXTREMITY EVALUATION   Patient Name: LASEAN GORNIAK MRN: [id number] DOB:31-Jul-1950, 72 y.o., male Today's Date: 06/20/2022  END OF SESSION:  PT End of Session - 06/20/22 1018     Visit Number 1    Authorization Type BCBS Medicare    PT Start Time 9147    PT Stop Time 1100    PT Time Calculation (min) 42 min    Activity Tolerance Patient tolerated treatment well    Behavior During Therapy WFL for tasks assessed/performed             Past Medical History:  Diagnosis Date   Arthritis    BBB (bundle branch block) 03/15/2016   Carpal tunnel syndrome    DDD (degenerative disc disease), lumbar 03/15/2016   Depression    Dysrhythmia    had some tachycardia with steroids   Elevated cholesterol 03/15/2016   Headache(784.0)    Ocular Migraines - takes Ambien for it   Heart murmur    HTN (hypertension) 03/15/2016   Hyperlipemia    Hypertension    Malignant melanoma of right side of neck ([place]) 03/15/2016   30 years ago    Neuralgia 03/15/2016   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    RLS (restless legs syndrome)    Sleep apnea    could not use a cpap   Snores    Status post gastric banding    Wears glasses    driving   Past Surgical History:  Procedure Laterality Date   ANTERIOR CERVICAL DECOMPRESSION/DISCECTOMY FUSION 4 LEVELS N/A 02/26/2020   Procedure: ANTERIOR CERVICAL DECOMPRESSION/DISCECTOMY FUSION, INTERBODY PROSTHESIS, PLATE/SCREWS CERVICAL THREE-FOUR CERVICAL FOUR-FIVE CERVICAL FIVE-SIX- CERVICAL SIX-SEVEN;  Surgeon: Newman Pies, MD;  Location: [place];  Service: Neurosurgery;  Laterality: N/A;   CARPAL TUNNEL RELEASE Right 09/26/2012   Procedure: CARPAL TUNNEL RELEASE;  Surgeon: Tennis Must, MD;  Location: [place];  Service: Orthopedics;  Laterality: Right;   CARPAL TUNNEL RELEASE Left 10/24/2012   Procedure: CARPAL TUNNEL RELEASE;   Surgeon: Tennis Must, MD;  Location: [place];  Service: Orthopedics;  Laterality: Left;   EXCISION MELANOMA WITH SENTINEL LYMPH NODE BIOPSY  1984   rt neck with mulpipal nodes and some muscle excised rt neck-shoulder   KNEE ARTHROPLASTY     knee replacement     LAPAROSCOPIC GASTRIC BANDING  03/15/2009   LAPAROSCOPIC GASTRIC BANDING     MUSCLE BIOPSY  2004   rt thigh to r/o myocitis   nerve conduction velosity test     SHOULDER SURGERY Right 12/2021   TONSILLECTOMY     TOTAL KNEE ARTHROPLASTY Bilateral 12/05/2010   TRIGGER FINGER RELEASE Right 09/26/2012   Procedure: RELEASE TRIGGER FINGER/A-1 PULLEY RING AND SMALL ;  Surgeon: Tennis Must, MD;  Location: [place];  Service: Orthopedics;  Laterality: Right;   TRIGGER FINGER RELEASE Left 10/24/2012   Procedure: RELEASE TRIGGER FINGER/A-1 PULLEY LEFT MIDDLE AND LEFT RING;  Surgeon: Tennis Must, MD;  Location: [place];  Service: Orthopedics;  Laterality: Left;   UPPER GI ENDOSCOPY     Patient Active Problem List   Diagnosis Date Noted   Cervical spondylosis with radiculopathy 02/26/2020   SBO (small bowel obstruction) ([place]) 08/21/2017   Trochanteric bursitis of left hip 01/18/2017   Lateral epicondylitis, right elbow 01/18/2017   Rheumatoid arthritis ([place]) 03/15/2016   DJD (  degenerative joint disease), cervical 03/15/2016   DDD (degenerative disc disease), lumbar 03/15/2016   HTN (hypertension) 03/15/2016   Obesity 03/15/2016   Elevated cholesterol 03/15/2016   Neuralgia 03/15/2016   Malignant melanoma of right side of neck ([place]) 03/15/2016   BBB (bundle branch block) 03/15/2016   High risk medication use 03/15/2016   Osteoarthritis of lumbar spine 03/15/2016   Primary osteoarthritis of both hands 03/15/2016   Primary osteoarthritis of both knees 03/15/2016   Mesenteric adenitis 06/29/2015    PCP: Orpah Melter, MD  REFERRING PROVIDER: Stamey, Girtha Rm,  FNP  REFERRING DIAG: R26.9 (ICD-10-CM) - Unspecified abnormalities of gait and mobility  THERAPY DIAG:  Unsteadiness on feet  Difficulty in walking, not elsewhere classified  Other abnormalities of gait and mobility  Rationale for Evaluation and Treatment: Rehabilitation  ONSET DATE: Within the last year  SUBJECTIVE:   SUBJECTIVE STATEMENT: "I've noticed that my balance isn't what it used to be. I've had a lot of falls and trips over things that I can normally navigate better." Pt states he is not as "sure footed as he used to be." Has looked at medications but can't come off any of them. Pt is scheduled to see neurologist for Alzheimer's. Last fall 3-4 months ago while walking up steps and carrying items. Pt reports biggest fall was in his yard hitting his recently repaired shoulder on a boulder. Falls tend to happen to right.   PERTINENT HISTORY: Bilat knee replacements, R shoulder replacement, ACDF (4 levels)  PAIN:  Are you having pain? Yes: NPRS scale: 0 currently, 3 at worst/10 Pain location: R shoulder Pain description: dull/achy Aggravating factors: Reaching Relieving factors: rest/ice  PRECAUTIONS: Fall  WEIGHT BEARING RESTRICTIONS: No  FALLS:  Has patient fallen in last 6 months? Yes. Number of falls 2  LIVING ENVIRONMENT: Lives with: lives with their spouse, daughter and grandchildren are 3 houses down Lives in: House/apartment Stairs: Yes: External: 3 steps; in garage with railing on L Has following equipment at home: None  OCCUPATION: Retired  PLOF: Independent  PATIENT GOALS: Improve balance  NEXT MD VISIT:   OBJECTIVE:   DIAGNOSTIC FINDINGS: none  PATIENT SURVEYS:  ABC scale: 88.43%  COGNITION: Overall cognitive status: Within functional limits for tasks assessed     SENSATION: WFL  MUSCLE LENGTH: Hamstrings: did not assess Thomas test: did not assess  POSTURE: No Significant postural limitations  PALPATION: Nothing relevant to  referral for gait  LOWER EXTREMITY ROM: WFL  LOWER EXTREMITY MMT:  MMT Right eval Left eval  Hip flexion 4 4  Hip extension    Hip abduction 4+ sitting 4+ sitting  Hip adduction 4+ sitting 4+ sitting  Hip internal rotation    Hip external rotation    Knee flexion 5 5  Knee extension 5 5  Ankle dorsiflexion    Ankle plantarflexion 5 5  Ankle inversion    Ankle eversion     (Blank rows = not tested)    FUNCTIONAL TESTS:  5 times sit to stand: 8.74 sec Berg Balance Scale: 49/56 Functional gait assessment: 22/30 mCTSIB: condition 1: 30 sec, condition 2: 30 sec, condition 3: 30 sec, condition 4: 30 sec but with increased sway/unsteadiness    [facility] PT Assessment - 06/20/22 0001       Standardized Balance Assessment   Standardized Balance Assessment Berg Balance Test      Berg Balance Test   Sit to Stand Able to stand without using hands and stabilize independently  Standing Unsupported Able to stand 2 minutes with supervision   Minor sway   Sitting with Back Unsupported but Feet Supported on Floor or Stool Able to sit safely and securely 2 minutes    Stand to Sit Sits safely with minimal use of hands    Transfers Able to transfer safely, minor use of hands    Standing Unsupported with Eyes Closed Able to stand 10 seconds safely    Standing Unsupported with Feet Together Able to place feet together independently and stand for 1 minute with supervision    From Standing, Reach Forward with Outstretched Arm Can reach confidently >25 cm (10")    From Standing Position, Pick up Object from Floor Able to pick up shoe safely and easily    From Standing Position, Turn to Look Behind Over each Shoulder Looks behind from both sides and weight shifts well    Turn 360 Degrees Able to turn 360 degrees safely but slowly    Standing Unsupported, Alternately Place Feet on Step/Stool Able to stand independently and complete 8 steps >20 seconds   1 minor LOB but able to perform in <20 sec    Standing Unsupported, One Foot in Front Able to plae foot ahead of the other independently and hold 30 seconds    Standing on One Leg Able to lift leg independently and hold 5-10 seconds   7 sec on R, 5 sec on L   Total Score 49      Functional Gait  Assessment   Gait assessed  Yes    Gait Level Surface Walks 20 ft in less than 5.5 sec, no assistive devices, good speed, no evidence for imbalance, normal gait pattern, deviates no more than 6 in outside of the 12 in walkway width.    Change in Gait Speed Able to smoothly change walking speed without loss of balance or gait deviation. Deviate no more than 6 in outside of the 12 in walkway width.    Gait with Horizontal Head Turns Performs head turns smoothly with no change in gait. Deviates no more than 6 in outside 12 in walkway width    Gait with Vertical Head Turns Performs task with slight change in gait velocity (eg, minor disruption to smooth gait path), deviates 6 - 10 in outside 12 in walkway width or uses assistive device    Gait and Pivot Turn Turns slowly, requires verbal cueing, or requires several small steps to catch balance following turn and stop    Step Over Obstacle Is able to step over one shoe box (4.5 in total height) without changing gait speed. No evidence of imbalance.    Gait with Narrow Base of Support Ambulates 7-9 steps.    Gait with Eyes Closed Walks 20 ft, uses assistive device, slower speed, mild gait deviations, deviates 6-10 in outside 12 in walkway width. Ambulates 20 ft in less than 9 sec but greater than 7 sec.    Ambulating Backwards Walks 20 ft, uses assistive device, slower speed, mild gait deviations, deviates 6-10 in outside 12 in walkway width.    Steps Alternating feet, must use rail.   1 minor LOB when trying without a rail -- marked down   Total Score 22              GAIT: Distance walked: To back of clinic Assistive device utilized: None Level of assistance: Complete Independence Comments:  [facility]   [facility] Adult PT Treatment:  DATE: 06/20/22 No exercises initiated due to length of assessment    PATIENT EDUCATION:  Education details: Exam findings, POC Person educated: Patient Education method: Explanation, Demonstration, and Handouts Education comprehension: verbalized understanding, returned demonstration, and needs further education  HOME EXERCISE PROGRAM: To be initiated next session  ASSESSMENT:  CLINICAL IMPRESSION: Patient is a 72 y.o. M who was seen today for physical therapy evaluation and treatment for unsteady gait/balance. Pt has history of multiple falls within the last 6 months. Assessment significant for moderate fall risk based on her FGA and Berg Balance Score with decreased vestibular system integration. Greatest difficulty with maintaining balance with initial full weight  shifting between R and L LE. Pt would benefit from PT to address these issues for improved mobility and balance.   OBJECTIVE IMPAIRMENTS: Abnormal gait, decreased activity tolerance, decreased balance, difficulty walking, decreased ROM, decreased strength, postural dysfunction, and obesity.   ACTIVITY LIMITATIONS: carrying, lifting, stairs, locomotion level, and caring for others  PARTICIPATION LIMITATIONS: community activity and yard work  PERSONAL FACTORS: Past/current experiences and Time since onset of injury/illness/exacerbation are also affecting patient's functional outcome.   REHAB POTENTIAL: Good  CLINICAL DECISION MAKING: Evolving/moderate complexity  EVALUATION COMPLEXITY: Moderate   GOALS: Goals reviewed with patient? Yes  SHORT TERM GOALS: Target date: 07/18/2022  Pt will be ind with initial HEP Baseline: Goal status: INITIAL   LONG TERM GOALS: Target date: 08/15/2022   Pt will be ind with progression and advancement of HEP.  Baseline:  Goal status: INITIAL  2.  Pt will demo improved Berg Balance Score to  >/=54/56 for low fall risk Baseline:  Goal status: INITIAL  3.  Pt will have improved FGA to >/=26/30 Baseline:  Goal status: INITIAL  4.  Pt will have improved ABC score to >/=90% Baseline:  Goal status: INITIAL    PLAN:  PT FREQUENCY: 2x/week  PT DURATION: 8 weeks  PLANNED INTERVENTIONS: Therapeutic exercises, Therapeutic activity, Neuromuscular re-education, Balance training, Gait training, Patient/Family education, Self Care, Joint mobilization, Vestibular training, Dry Needling, Electrical stimulation, Spinal mobilization, Cryotherapy, Moist heat, Taping, Ionotophoresis '4mg'$ /ml Dexamethasone, Manual therapy, and Re-evaluation  PLAN FOR NEXT SESSION: Initiate balance HEP. Work on high level balance -- narrow BOS, unstable surfaces, challenge vestibular system, weight shifting, obstacle negotiation.    Meli Faley April Ma L Culley Hedeen, PT 06/20/2022, 1:38 PM

## 2022-06-20 NOTE — Telephone Encounter (Signed)
Next Visit: 07/20/2022  Last Visit: 02/08/2022  Last Fill: 02/08/2022  DX: Rheumatoid arthritis of multiple sites with negative rheumatoid factor   Current Dose per office note on 02/08/2022: sulfasalazine 500 mg 2 tablets po BID.   Labs: 05/17/2022 CO2 34, glucose 106, creatinine 1.26  Okay to refill SSZ?

## 2022-06-23 ENCOUNTER — Ambulatory Visit: Payer: Medicare Other | Attending: Family Medicine

## 2022-06-23 DIAGNOSIS — R2681 Unsteadiness on feet: Secondary | ICD-10-CM | POA: Diagnosis not present

## 2022-06-23 DIAGNOSIS — R2689 Other abnormalities of gait and mobility: Secondary | ICD-10-CM | POA: Diagnosis not present

## 2022-06-23 DIAGNOSIS — R262 Difficulty in walking, not elsewhere classified: Secondary | ICD-10-CM | POA: Diagnosis not present

## 2022-06-23 NOTE — Therapy (Signed)
OUTPATIENT PHYSICAL THERAPY LOWER EXTREMITY TREATMENT   Patient Name: David [name] MRN: [id number] DOB:1950-06-21, 72 y.o., male Today's Date: 06/23/2022  END OF SESSION:  PT End of Session - 06/23/22 1010     Visit Number 2    Date for PT Re-Evaluation 04/18/22    Authorization Type BCBS Medicare    Progress Note Due on Visit 15    PT Start Time 1010    PT Stop Time 1100    PT Time Calculation (min) 50 min    Activity Tolerance Patient tolerated treatment well    Behavior During Therapy [facility] for tasks assessed/performed             Past Medical History:  Diagnosis Date   Arthritis    BBB (bundle branch block) 03/15/2016   Carpal tunnel syndrome    DDD (degenerative disc disease), lumbar 03/15/2016   Depression    Dysrhythmia    had some tachycardia with steroids   Elevated cholesterol 03/15/2016   Headache(784.0)    Ocular Migraines - takes Ambien for it   Heart murmur    HTN (hypertension) 03/15/2016   Hyperlipemia    Hypertension    Malignant melanoma of right side of neck ([place]) 03/15/2016   30 years ago    Neuralgia 03/15/2016   PPD positive    due to immunotherapy from melanoma   Rheumatoid arthritis ([place]) 03/15/2016   Sero Negative.    RLS (restless legs syndrome)    Sleep apnea    could not use a cpap   Snores    Status post gastric banding    Wears glasses    driving   Past Surgical History:  Procedure Laterality Date   ANTERIOR CERVICAL DECOMPRESSION/DISCECTOMY FUSION 4 LEVELS N/A 02/26/2020   Procedure: ANTERIOR CERVICAL DECOMPRESSION/DISCECTOMY FUSION, INTERBODY PROSTHESIS, PLATE/SCREWS CERVICAL THREE-FOUR CERVICAL FOUR-FIVE CERVICAL FIVE-SIX- CERVICAL SIX-SEVEN;  Surgeon: Newman Pies, MD;  Location: [place];  Service: Neurosurgery;  Laterality: N/A;   CARPAL TUNNEL RELEASE Right 09/26/2012   Procedure: CARPAL TUNNEL RELEASE;  Surgeon: Tennis Must, MD;  Location: [place];  Service: Orthopedics;  Laterality: Right;    CARPAL TUNNEL RELEASE Left 10/24/2012   Procedure: CARPAL TUNNEL RELEASE;  Surgeon: Tennis Must, MD;  Location: [place];  Service: Orthopedics;  Laterality: Left;   EXCISION MELANOMA WITH SENTINEL LYMPH NODE BIOPSY  1984   rt neck with mulpipal nodes and some muscle excised rt neck-shoulder   KNEE ARTHROPLASTY     knee replacement     LAPAROSCOPIC GASTRIC BANDING  03/15/2009   LAPAROSCOPIC GASTRIC BANDING     MUSCLE BIOPSY  2004   rt thigh to r/o myocitis   nerve conduction velosity test     SHOULDER SURGERY Right 12/2021   TONSILLECTOMY     TOTAL KNEE ARTHROPLASTY Bilateral 12/05/2010   TRIGGER FINGER RELEASE Right 09/26/2012   Procedure: RELEASE TRIGGER FINGER/A-1 PULLEY RING AND SMALL ;  Surgeon: Tennis Must, MD;  Location: [place];  Service: Orthopedics;  Laterality: Right;   TRIGGER FINGER RELEASE Left 10/24/2012   Procedure: RELEASE TRIGGER FINGER/A-1 PULLEY LEFT MIDDLE AND LEFT RING;  Surgeon: Tennis Must, MD;  Location: [place];  Service: Orthopedics;  Laterality: Left;   UPPER GI ENDOSCOPY     Patient Active Problem List   Diagnosis Date Noted   Cervical spondylosis with radiculopathy 02/26/2020   SBO (small bowel obstruction) ([place]) 08/21/2017   Trochanteric bursitis of left hip  01/18/2017   Lateral epicondylitis, right elbow 01/18/2017   Rheumatoid arthritis ([place]) 03/15/2016   DJD (degenerative joint disease), cervical 03/15/2016   DDD (degenerative disc disease), lumbar 03/15/2016   HTN (hypertension) 03/15/2016   Obesity 03/15/2016   Elevated cholesterol 03/15/2016   Neuralgia 03/15/2016   Malignant melanoma of right side of neck ([place]) 03/15/2016   BBB (bundle branch block) 03/15/2016   High risk medication use 03/15/2016   Osteoarthritis of lumbar spine 03/15/2016   Primary osteoarthritis of both hands 03/15/2016   Primary osteoarthritis of both knees 03/15/2016   Mesenteric adenitis 06/29/2015    PCP:  Orpah Melter, MD  REFERRING PROVIDER: Stamey, Girtha Rm, FNP  REFERRING DIAG: R26.9 (ICD-10-CM) - Unspecified abnormalities of gait and mobility  THERAPY DIAG:  Unsteadiness on feet  Difficulty in walking, not elsewhere classified  Other abnormalities of gait and mobility  Rationale for Evaluation and Treatment: Rehabilitation  ONSET DATE: Within the last year  SUBJECTIVE:   SUBJECTIVE STATEMENT:  Patient reports he was upset after evaluation due to feeling off-balanced with testing, states he was more unsteady than he originally thought.  PERTINENT HISTORY: Bilat knee replacements, R shoulder replacement, ACDF (4 levels)  PAIN:  Are you having pain? Yes: NPRS scale: 0 currently, 3 at worst/10 Pain location: R shoulder Pain description: dull/achy Aggravating factors: Reaching Relieving factors: rest/ice  PRECAUTIONS: Fall  WEIGHT BEARING RESTRICTIONS: No  FALLS:  Has patient fallen in last 6 months? Yes. Number of falls 2  LIVING ENVIRONMENT: Lives with: lives with their spouse, daughter and grandchildren are 3 houses down Lives in: House/apartment Stairs: Yes: External: 3 steps; in garage with railing on L Has following equipment at home: None  OCCUPATION: Retired  PLOF: Independent  PATIENT GOALS: Improve balance  NEXT MD VISIT:   OBJECTIVE:   DIAGNOSTIC FINDINGS: none  PATIENT SURVEYS:  ABC scale: 88.43%  COGNITION: Overall cognitive status: Within functional limits for tasks assessed     SENSATION: WFL  MUSCLE LENGTH: Hamstrings: did not assess Thomas test: did not assess  POSTURE: No Significant postural limitations  PALPATION: Nothing relevant to referral for gait  LOWER EXTREMITY ROM: WFL  LOWER EXTREMITY MMT:  MMT Right eval Left eval  Hip flexion 4 4  Hip extension    Hip abduction 4+ sitting 4+ sitting  Hip adduction 4+ sitting 4+ sitting  Hip internal rotation    Hip external rotation    Knee flexion 5 5  Knee  extension 5 5  Ankle dorsiflexion    Ankle plantarflexion 5 5  Ankle inversion    Ankle eversion     (Blank rows = not tested)    FUNCTIONAL TESTS:  5 times sit to stand: 8.74 sec Berg Balance Scale: 49/56 Functional gait assessment: 22/30 mCTSIB: condition 1: 30 sec, condition 2: 30 sec, condition 3: 30 sec, condition 4: 30 sec but with increased sway/unsteadiness    GAIT: Distance walked: To back of clinic Assistive device utilized: None Level of assistance: Complete Independence Comments: [facility]  [facility] Adult PT Treatment:                                                DATE: 06/23/2022 Neuromuscular re-ed: Fwd amb Y/P every 3 steps 2L each Fwd amb EO/EC every 3 steps 1L --> fwd amb EC 1L Bkwd amb yaw HT 1L --> no HT  2L Side & forward marching w/cross knee taps 1L each Tandem amb w/long fixation --> added slow yaw head turns 2L Standing tandem static balance --> added slow yaw head turns Staggered stance A/P weight shifting --> added Y/P head turns Corner w/chair: mREC Y/P 4x10" REO Y/P 4x10" REC static standing x10"   [facility] Adult PT Treatment:                                                DATE: 06/20/22 No exercises initiated due to length of assessment    PATIENT EDUCATION:  Education details: HEP; ankle/hip strategies for balance Person educated: Patient Education method: Explanation, Demonstration, and Handouts Education comprehension: verbalized understanding, returned demonstration, and needs further education  HOME EXERCISE PROGRAM: Access Code: [email or web address] URL: [email or web address] Date: 06/23/2022 Prepared by: Helane Gunther  Exercises - Romberg Stance with Head Nods  - 2 x daily - 7 x weekly - 1 sets - 10 reps - 10-5 sec hold - Corner Balance Feet Apart: Eyes Closed With Head Turns  - 2 x daily - 7 x weekly - 1 sets - 10 reps - 10-5 sec hold - Tandem Stance with Head Rotation  - 2 x daily - 7 x weekly - 1 sets - 10 reps - 10-5 sec hold - Tandem  Stance with Head Nods   - 2 x daily - 7 x weekly - 1 sets - 10 reps - 10-5 sec hold - Romberg Stance with Eyes Closed  - 2 x daily - 7 x weekly - 1 sets - 10 reps - 10-5 sec hold  ASSESSMENT:  CLINICAL IMPRESSION: Occasional stepping strategy utilized to regain balance during ambulation with head turns. Cueing provided to decrease posterior trunk lean during backwards walking. Patient instructed in hip strategy for improved reaction and maintaining balance with progression to NBOS. Mild postural sway demonstrated with eyes closed standing balance.   OBJECTIVE IMPAIRMENTS: Abnormal gait, decreased activity tolerance, decreased balance, difficulty walking, decreased ROM, decreased strength, postural dysfunction, and obesity.   ACTIVITY LIMITATIONS: carrying, lifting, stairs, locomotion level, and caring for others  PARTICIPATION LIMITATIONS: community activity and yard work  PERSONAL FACTORS: Past/current experiences and Time since onset of injury/illness/exacerbation are also affecting patient's functional outcome.   REHAB POTENTIAL: Good  CLINICAL DECISION MAKING: Evolving/moderate complexity  EVALUATION COMPLEXITY: Moderate   GOALS: Goals reviewed with patient? Yes  SHORT TERM GOALS: Target date: 07/18/2022  Pt will be ind with initial HEP Baseline: Goal status: INITIAL   LONG TERM GOALS: Target date: 08/15/2022   Pt will be ind with progression and advancement of HEP.  Baseline:  Goal status: INITIAL  2.  Pt will demo improved Berg Balance Score to >/=54/56 for low fall risk Baseline:  Goal status: INITIAL  3.  Pt will have improved FGA to >/=26/30 Baseline:  Goal status: INITIAL  4.  Pt will have improved ABC score to >/=90% Baseline:  Goal status: INITIAL    PLAN:  PT FREQUENCY: 2x/week  PT DURATION: 8 weeks  PLANNED INTERVENTIONS: Therapeutic exercises, Therapeutic activity, Neuromuscular re-education, Balance training, Gait training, Patient/Family  education, Self Care, Joint mobilization, Vestibular training, Dry Needling, Electrical stimulation, Spinal mobilization, Cryotherapy, Moist heat, Taping, Ionotophoresis '4mg'$ /ml Dexamethasone, Manual therapy, and Re-evaluation  PLAN FOR NEXT SESSION: Follow up on HEP; update as needed. Work on high level balance -- narrow BOS, unstable surfaces,  challenge vestibular system, weight shifting, obstacle negotiation.    Helane Gunther, PTA 06/23/2022

## 2022-06-27 ENCOUNTER — Ambulatory Visit: Payer: Medicare Other

## 2022-06-27 DIAGNOSIS — R2689 Other abnormalities of gait and mobility: Secondary | ICD-10-CM | POA: Diagnosis not present

## 2022-06-27 DIAGNOSIS — R262 Difficulty in walking, not elsewhere classified: Secondary | ICD-10-CM

## 2022-06-27 DIAGNOSIS — R2681 Unsteadiness on feet: Secondary | ICD-10-CM | POA: Diagnosis not present

## 2022-06-27 NOTE — Therapy (Signed)
OUTPATIENT PHYSICAL THERAPY LOWER EXTREMITY TREATMENT   Patient Name: David [name] MRN: [id number] DOB:01/19/1951, 72 y.o., male Today's Date: 06/27/2022  END OF SESSION:  PT End of Session - 06/27/22 1026     Visit Number 3    Date for PT Re-Evaluation 08/15/22    Authorization Type BCBS Medicare    Progress Note Due on Visit 15    PT Start Time 1026    PT Stop Time 1110    PT Time Calculation (min) 44 min    Activity Tolerance Patient tolerated treatment well    Behavior During Therapy WFL for tasks assessed/performed             Past Medical History:  Diagnosis Date   Arthritis    BBB (bundle branch block) 03/15/2016   Carpal tunnel syndrome    DDD (degenerative disc disease), lumbar 03/15/2016   Depression    Dysrhythmia    had some tachycardia with steroids   Elevated cholesterol 03/15/2016   Headache(784.0)    Ocular Migraines - takes Ambien for it   Heart murmur    HTN (hypertension) 03/15/2016   Hyperlipemia    Hypertension    Malignant melanoma of right side of neck ([place]) 03/15/2016   30 years ago    Neuralgia 03/15/2016   PPD positive    due to immunotherapy from melanoma   Rheumatoid arthritis ([place]) 03/15/2016   Sero Negative.    RLS (restless legs syndrome)    Sleep apnea    could not use a cpap   Snores    Status post gastric banding    Wears glasses    driving   Past Surgical History:  Procedure Laterality Date   ANTERIOR CERVICAL DECOMPRESSION/DISCECTOMY FUSION 4 LEVELS N/A 02/26/2020   Procedure: ANTERIOR CERVICAL DECOMPRESSION/DISCECTOMY FUSION, INTERBODY PROSTHESIS, PLATE/SCREWS CERVICAL THREE-FOUR CERVICAL FOUR-FIVE CERVICAL FIVE-SIX- CERVICAL SIX-SEVEN;  Surgeon: Newman Pies, MD;  Location: [place];  Service: Neurosurgery;  Laterality: N/A;   CARPAL TUNNEL RELEASE Right 09/26/2012   Procedure: CARPAL TUNNEL RELEASE;  Surgeon: Tennis Must, MD;  Location: [place];  Service: Orthopedics;  Laterality: Right;    CARPAL TUNNEL RELEASE Left 10/24/2012   Procedure: CARPAL TUNNEL RELEASE;  Surgeon: Tennis Must, MD;  Location: [place];  Service: Orthopedics;  Laterality: Left;   EXCISION MELANOMA WITH SENTINEL LYMPH NODE BIOPSY  1984   rt neck with mulpipal nodes and some muscle excised rt neck-shoulder   KNEE ARTHROPLASTY     knee replacement     LAPAROSCOPIC GASTRIC BANDING  03/15/2009   LAPAROSCOPIC GASTRIC BANDING     MUSCLE BIOPSY  2004   rt thigh to r/o myocitis   nerve conduction velosity test     SHOULDER SURGERY Right 12/2021   TONSILLECTOMY     TOTAL KNEE ARTHROPLASTY Bilateral 12/05/2010   TRIGGER FINGER RELEASE Right 09/26/2012   Procedure: RELEASE TRIGGER FINGER/A-1 PULLEY RING AND SMALL ;  Surgeon: Tennis Must, MD;  Location: [place];  Service: Orthopedics;  Laterality: Right;   TRIGGER FINGER RELEASE Left 10/24/2012   Procedure: RELEASE TRIGGER FINGER/A-1 PULLEY LEFT MIDDLE AND LEFT RING;  Surgeon: Tennis Must, MD;  Location: [place];  Service: Orthopedics;  Laterality: Left;   UPPER GI ENDOSCOPY     Patient Active Problem List   Diagnosis Date Noted   Cervical spondylosis with radiculopathy 02/26/2020   SBO (small bowel obstruction) ([place]) 08/21/2017   Trochanteric bursitis of left hip  01/18/2017   Lateral epicondylitis, right elbow 01/18/2017   Rheumatoid arthritis ([place]) 03/15/2016   DJD (degenerative joint disease), cervical 03/15/2016   DDD (degenerative disc disease), lumbar 03/15/2016   HTN (hypertension) 03/15/2016   Obesity 03/15/2016   Elevated cholesterol 03/15/2016   Neuralgia 03/15/2016   Malignant melanoma of right side of neck ([place]) 03/15/2016   BBB (bundle branch block) 03/15/2016   High risk medication use 03/15/2016   Osteoarthritis of lumbar spine 03/15/2016   Primary osteoarthritis of both hands 03/15/2016   Primary osteoarthritis of both knees 03/15/2016   Mesenteric adenitis 06/29/2015    PCP:  Orpah Melter, MD  REFERRING PROVIDER: Stamey, Girtha Rm, FNP  REFERRING DIAG: R26.9 (ICD-10-CM) - Unspecified abnormalities of gait and mobility  THERAPY DIAG:  Unsteadiness on feet  Difficulty in walking, not elsewhere classified  Other abnormalities of gait and mobility  Rationale for Evaluation and Treatment: Rehabilitation  ONSET DATE: Within the last year  SUBJECTIVE:   SUBJECTIVE STATEMENT:  Patient reports he did not sleep well due to issues with Cpap machine and is feeling fuzzy and mildly dizzy today. Patient states his balance is feeling worse off than usual today.   PERTINENT HISTORY: Bilat knee replacements, R shoulder replacement, ACDF (4 levels)  PAIN:  Are you having pain? Yes: NPRS scale: 0 currently, 3 at worst/10 Pain location: R shoulder Pain description: dull/achy Aggravating factors: Reaching Relieving factors: rest/ice  PRECAUTIONS: Fall  WEIGHT BEARING RESTRICTIONS: No  FALLS:  Has patient fallen in last 6 months? Yes. Number of falls 2  LIVING ENVIRONMENT: Lives with: lives with their spouse, daughter and grandchildren are 3 houses down Lives in: House/apartment Stairs: Yes: External: 3 steps; in garage with railing on L Has following equipment at home: None  OCCUPATION: Retired  PLOF: Independent  PATIENT GOALS: Improve balance  NEXT MD VISIT:   OBJECTIVE:   DIAGNOSTIC FINDINGS: none  PATIENT SURVEYS:  ABC scale: 88.43%  COGNITION: Overall cognitive status: Within functional limits for tasks assessed     SENSATION: WFL  MUSCLE LENGTH: Hamstrings: did not assess Thomas test: did not assess  POSTURE: No Significant postural limitations  PALPATION: Nothing relevant to referral for gait  LOWER EXTREMITY ROM: WFL  LOWER EXTREMITY MMT:  MMT Right eval Left eval  Hip flexion 4 4  Hip extension    Hip abduction 4+ sitting 4+ sitting  Hip adduction 4+ sitting 4+ sitting  Hip internal rotation    Hip external  rotation    Knee flexion 5 5  Knee extension 5 5  Ankle dorsiflexion    Ankle plantarflexion 5 5  Ankle inversion    Ankle eversion     (Blank rows = not tested)    FUNCTIONAL TESTS:  5 times sit to stand: 8.74 sec Berg Balance Scale: 49/56 Functional gait assessment: 22/30 mCTSIB: condition 1: 30 sec, condition 2: 30 sec, condition 3: 30 sec, condition 4: 30 sec but with increased sway/unsteadiness    GAIT: Distance walked: To back of clinic Assistive device utilized: None Level of assistance: Complete Independence Comments: [facility]   [facility] Adult PT Treatment:                                                DATE: 06/27/2022 Neuromuscular re-ed: Fwd amb + yaw 4L, pitch 2L head turns --> gaze fixation focus Fwd amb R  head turns every 3 steps 2L --> yaw HT every 3 steps 2L Standing self toss basketball + eyes tracking target  Fwd/Sideways amb + basketball dribbling Fwd amb + basketball toss with therapist Bkwd amb 4L Fwd amb + changes in gait speed/sudden start & stop Corner + chair: mREC Y/P 4x5" each Airex: mREO Y/P 4x5" each --> repeated in pREO Airex: mREC static standing --> added Y/P HT   [facility] Adult PT Treatment:                                                DATE: 06/23/2022 Neuromuscular re-ed: Fwd amb Y/P every 3 steps 2L each Fwd amb EO/EC every 3 steps 1L --> fwd amb EC 1L Bkwd amb yaw HT 1L --> no HT 2L Side & forward marching w/cross knee taps 1L each Tandem amb w/long fixation --> added slow yaw head turns 2L Standing tandem static balance --> added slow yaw head turns Staggered stance A/P weight shifting --> added Y/P head turns Corner w/chair: mREC Y/P 4x10" REO Y/P 4x10" REC static standing x10"   PATIENT EDUCATION:  Education details: explanation of systems that make up overall balance; performing balance HEP in socks vs shoes Person educated: Patient Education method: Explanation, Demonstration, and Handouts Education comprehension: verbalized  understanding, returned demonstration, and needs further education  HOME EXERCISE PROGRAM: Access Code: [email or web address] URL: [email or web address] Date: 06/23/2022 Prepared by: Helane Gunther  Exercises - Romberg Stance with Head Nods  - 2 x daily - 7 x weekly - 1 sets - 10 reps - 10-5 sec hold - Corner Balance Feet Apart: Eyes Closed With Head Turns  - 2 x daily - 7 x weekly - 1 sets - 10 reps - 10-5 sec hold - Tandem Stance with Head Rotation  - 2 x daily - 7 x weekly - 1 sets - 10 reps - 10-5 sec hold - Tandem Stance with Head Nods   - 2 x daily - 7 x weekly - 1 sets - 10 reps - 10-5 sec hold - Romberg Stance with Eyes Closed  - 2 x daily - 7 x weekly - 1 sets - 10 reps - 10-5 sec hold  ASSESSMENT:  CLINICAL IMPRESSION: Cueing for target fixation during head turns improved stability during ambulation. Patient continues to demonstrate posterior trunk lean during backwards ambulation that gradually increases over time.Somatosensory balance system challenged with compliant surface during standing balance with slow head turns; posterior postural sway exhibited during progression to eyes closed.   OBJECTIVE IMPAIRMENTS: Abnormal gait, decreased activity tolerance, decreased balance, difficulty walking, decreased ROM, decreased strength, postural dysfunction, and obesity.   ACTIVITY LIMITATIONS: carrying, lifting, stairs, locomotion level, and caring for others  PARTICIPATION LIMITATIONS: community activity and yard work  PERSONAL FACTORS: Past/current experiences and Time since onset of injury/illness/exacerbation are also affecting patient's functional outcome.   REHAB POTENTIAL: Good  CLINICAL DECISION MAKING: Evolving/moderate complexity  EVALUATION COMPLEXITY: Moderate   GOALS: Goals reviewed with patient? Yes  SHORT TERM GOALS: Target date: 07/18/2022  Pt will be ind with initial HEP Baseline: Goal status: INITIAL   LONG TERM GOALS: Target date: 08/15/2022   Pt  will be ind with progression and advancement of HEP.  Baseline:  Goal status: INITIAL  2.  Pt will demo improved Berg Balance Score to >/=54/56 for low fall risk Baseline:  Goal status: INITIAL  3.  Pt will have improved FGA to >/=26/30 Baseline:  Goal status: INITIAL  4.  Pt will have improved ABC score to >/=90% Baseline:  Goal status: INITIAL    PLAN:  PT FREQUENCY: 2x/week  PT DURATION: 8 weeks  PLANNED INTERVENTIONS: Therapeutic exercises, Therapeutic activity, Neuromuscular re-education, Balance training, Gait training, Patient/Family education, Self Care, Joint mobilization, Vestibular training, Dry Needling, Electrical stimulation, Spinal mobilization, Cryotherapy, Moist heat, Taping, Ionotophoresis '4mg'$ /ml Dexamethasone, Manual therapy, and Re-evaluation  PLAN FOR NEXT SESSION: Work on high level balance -- narrow BOS, unstable surfaces, challenge vestibular system, weight shifting, obstacle negotiation.    Helane Gunther, PTA 06/27/2022

## 2022-06-28 ENCOUNTER — Telehealth: Payer: Self-pay

## 2022-06-28 NOTE — Telephone Encounter (Signed)
This patient is being referred back to our office for memory concerns, last seen by Dr Rexene Alberts in 2021. They are requesting a provider switch to Dr Leta Baptist.

## 2022-06-28 NOTE — Telephone Encounter (Signed)
Okay with me, had evaluated him once some 3 y ago, had carpal tunnel.

## 2022-06-29 ENCOUNTER — Ambulatory Visit (HOSPITAL_BASED_OUTPATIENT_CLINIC_OR_DEPARTMENT_OTHER)
Admission: RE | Admit: 2022-06-29 | Discharge: 2022-06-29 | Disposition: A | Discharge status: Home / Self Care | Payer: Medicare Other | Source: Ambulatory Visit | Attending: Family Medicine | Admitting: Family Medicine

## 2022-06-29 DIAGNOSIS — E782 Mixed hyperlipidemia: Secondary | ICD-10-CM | POA: Insufficient documentation

## 2022-06-30 ENCOUNTER — Ambulatory Visit: Payer: Medicare Other

## 2022-06-30 DIAGNOSIS — R2689 Other abnormalities of gait and mobility: Secondary | ICD-10-CM | POA: Diagnosis not present

## 2022-06-30 DIAGNOSIS — R2681 Unsteadiness on feet: Secondary | ICD-10-CM

## 2022-06-30 DIAGNOSIS — R262 Difficulty in walking, not elsewhere classified: Secondary | ICD-10-CM

## 2022-06-30 NOTE — Therapy (Signed)
OUTPATIENT PHYSICAL THERAPY LOWER EXTREMITY TREATMENT   Patient Name: David [name] MRN: [id number] DOB:11/23/1950, 72 y.o., male Today's Date: 06/30/2022  END OF SESSION:  PT End of Session - 06/30/22 1017     Visit Number 4    Date for PT Re-Evaluation 08/15/22    Authorization Type BCBS Medicare    Progress Note Due on Visit 15    PT Start Time 1015    PT Stop Time 1058    PT Time Calculation (min) 43 min    Activity Tolerance Patient tolerated treatment well    Behavior During Therapy WFL for tasks assessed/performed             Past Medical History:  Diagnosis Date   Arthritis    BBB (bundle branch block) 03/15/2016   Carpal tunnel syndrome    DDD (degenerative disc disease), lumbar 03/15/2016   Depression    Dysrhythmia    had some tachycardia with steroids   Elevated cholesterol 03/15/2016   Headache(784.0)    Ocular Migraines - takes Ambien for it   Heart murmur    HTN (hypertension) 03/15/2016   Hyperlipemia    Hypertension    Malignant melanoma of right side of neck ([place]) 03/15/2016   30 years ago    Neuralgia 03/15/2016   PPD positive    due to immunotherapy from melanoma   Rheumatoid arthritis ([place]) 03/15/2016   Sero Negative.    RLS (restless legs syndrome)    Sleep apnea    could not use a cpap   Snores    Status post gastric banding    Wears glasses    driving   Past Surgical History:  Procedure Laterality Date   ANTERIOR CERVICAL DECOMPRESSION/DISCECTOMY FUSION 4 LEVELS N/A 02/26/2020   Procedure: ANTERIOR CERVICAL DECOMPRESSION/DISCECTOMY FUSION, INTERBODY PROSTHESIS, PLATE/SCREWS CERVICAL THREE-FOUR CERVICAL FOUR-FIVE CERVICAL FIVE-SIX- CERVICAL SIX-SEVEN;  Surgeon: Newman Pies, MD;  Location: [place];  Service: Neurosurgery;  Laterality: N/A;   CARPAL TUNNEL RELEASE Right 09/26/2012   Procedure: CARPAL TUNNEL RELEASE;  Surgeon: Tennis Must, MD;  Location: [place];  Service: Orthopedics;  Laterality: Right;    CARPAL TUNNEL RELEASE Left 10/24/2012   Procedure: CARPAL TUNNEL RELEASE;  Surgeon: Tennis Must, MD;  Location: [place];  Service: Orthopedics;  Laterality: Left;   EXCISION MELANOMA WITH SENTINEL LYMPH NODE BIOPSY  1984   rt neck with mulpipal nodes and some muscle excised rt neck-shoulder   KNEE ARTHROPLASTY     knee replacement     LAPAROSCOPIC GASTRIC BANDING  03/15/2009   LAPAROSCOPIC GASTRIC BANDING     MUSCLE BIOPSY  2004   rt thigh to r/o myocitis   nerve conduction velosity test     SHOULDER SURGERY Right 12/2021   TONSILLECTOMY     TOTAL KNEE ARTHROPLASTY Bilateral 12/05/2010   TRIGGER FINGER RELEASE Right 09/26/2012   Procedure: RELEASE TRIGGER FINGER/A-1 PULLEY RING AND SMALL ;  Surgeon: Tennis Must, MD;  Location: [place];  Service: Orthopedics;  Laterality: Right;   TRIGGER FINGER RELEASE Left 10/24/2012   Procedure: RELEASE TRIGGER FINGER/A-1 PULLEY LEFT MIDDLE AND LEFT RING;  Surgeon: Tennis Must, MD;  Location: [place];  Service: Orthopedics;  Laterality: Left;   UPPER GI ENDOSCOPY     Patient Active Problem List   Diagnosis Date Noted   Cervical spondylosis with radiculopathy 02/26/2020   SBO (small bowel obstruction) ([place]) 08/21/2017   Trochanteric bursitis of left hip  01/18/2017   Lateral epicondylitis, right elbow 01/18/2017   Rheumatoid arthritis ([place]) 03/15/2016   DJD (degenerative joint disease), cervical 03/15/2016   DDD (degenerative disc disease), lumbar 03/15/2016   HTN (hypertension) 03/15/2016   Obesity 03/15/2016   Elevated cholesterol 03/15/2016   Neuralgia 03/15/2016   Malignant melanoma of right side of neck ([place]) 03/15/2016   BBB (bundle branch block) 03/15/2016   High risk medication use 03/15/2016   Osteoarthritis of lumbar spine 03/15/2016   Primary osteoarthritis of both hands 03/15/2016   Primary osteoarthritis of both knees 03/15/2016   Mesenteric adenitis 06/29/2015    PCP:  Orpah Melter, MD  REFERRING PROVIDER: Stamey, Girtha Rm, FNP  REFERRING DIAG: R26.9 (ICD-10-CM) - Unspecified abnormalities of gait and mobility  THERAPY DIAG:  Unsteadiness on feet  Difficulty in walking, not elsewhere classified  Other abnormalities of gait and mobility  Rationale for Evaluation and Treatment: Rehabilitation  ONSET DATE: Within the last year  SUBJECTIVE:   SUBJECTIVE STATEMENT:  Patient reports he is slept much better last night, states he is taking his Cpap machine in to be repaired today. Patient states he was dizzy after last PT session but felt fine the next day and has not ben dizzy since.     PERTINENT HISTORY: Bilat knee replacements, R shoulder replacement, ACDF (4 levels)  PAIN:  Are you having pain? Yes: NPRS scale: 0 currently, 3 at worst/10 Pain location: R shoulder Pain description: dull/achy Aggravating factors: Reaching Relieving factors: rest/ice  PRECAUTIONS: Fall  WEIGHT BEARING RESTRICTIONS: No  FALLS:  Has patient fallen in last 6 months? Yes. Number of falls 2  LIVING ENVIRONMENT: Lives with: lives with their spouse, daughter and grandchildren are 3 houses down Lives in: House/apartment Stairs: Yes: External: 3 steps; in garage with railing on L Has following equipment at home: None  OCCUPATION: Retired  PLOF: Independent  PATIENT GOALS: Improve balance  NEXT MD VISIT:   OBJECTIVE:   DIAGNOSTIC FINDINGS: none  PATIENT SURVEYS:  ABC scale: 88.43%  COGNITION: Overall cognitive status: Within functional limits for tasks assessed     SENSATION: WFL  MUSCLE LENGTH: Hamstrings: did not assess Thomas test: did not assess  POSTURE: No Significant postural limitations  PALPATION: Nothing relevant to referral for gait  LOWER EXTREMITY ROM: WFL  LOWER EXTREMITY MMT:  MMT Right eval Left eval  Hip flexion 4 4  Hip extension    Hip abduction 4+ sitting 4+ sitting  Hip adduction 4+ sitting 4+ sitting   Hip internal rotation    Hip external rotation    Knee flexion 5 5  Knee extension 5 5  Ankle dorsiflexion    Ankle plantarflexion 5 5  Ankle inversion    Ankle eversion     (Blank rows = not tested)    FUNCTIONAL TESTS:  5 times sit to stand: 8.74 sec Berg Balance Scale: 49/56 Functional gait assessment: 22/30 mCTSIB: condition 1: 30 sec, condition 2: 30 sec, condition 3: 30 sec, condition 4: 30 sec but with increased sway/unsteadiness    GAIT: Distance walked: To back of clinic Assistive device utilized: None Level of assistance: Complete Independence Comments: [facility]   [facility] Adult PT Treatment:                                                DATE: 06/30/2022 Neuromuscular re-ed: Fwd amb yaw head  turns every 3 steps 4L Fwd amb VORx1 Y/P head turns (far target) 2L each Standing VORc yaw plane 2x30" Fwd amb VORc yaw plane 2L mREO VORx1 yaw plane: 100bpm x30", 110 bpm x30", 120 bpm x30" mREC Y/P HT x20" each, metronome @ 70 bpm mREO-pREO on airex: VORc yaw plane, 70 bpm 2x30" pREO on airex: VORx1 yaw plane: 00bpm x30", 110 bpm x30", 120 bpm x30" mREC on airex: static standing balance 5x30" Bkwd amb 1L --> posterior balance reactive strategy (orange SB)   [facility] Adult PT Treatment:                                                DATE: 06/27/2022 Neuromuscular re-ed: Fwd amb + yaw 4L, pitch 2L head turns --> gaze fixation focus Fwd amb R head turns every 3 steps 2L --> yaw HT every 3 steps 2L Standing self toss basketball + eyes tracking target  Fwd/Sideways amb + basketball dribbling Fwd amb + basketball toss with therapist Bkwd amb 4L Fwd amb + changes in gait speed/sudden start & stop Corner + chair: mREC Y/P 4x5" each Airex: mREO Y/P 4x5" each --> repeated in pREO Airex: mREC static standing --> added Y/P HT   PATIENT EDUCATION:  Education details: Progressing HEP Person educated: Patient Education method: Explanation, Demonstration, and Handouts Education  comprehension: verbalized understanding, returned demonstration, and needs further education  HOME EXERCISE PROGRAM: Access Code: [email or web address] URL: [email or web address] Date: 06/23/2022 Prepared by: Helane Gunther  Exercises - Romberg Stance with Head Nods  - 2 x daily - 7 x weekly - 1 sets - 10 reps - 10-5 sec hold - Corner Balance Feet Apart: Eyes Closed With Head Turns  - 2 x daily - 7 x weekly - 1 sets - 10 reps - 10-5 sec hold - Tandem Stance with Head Rotation  - 2 x daily - 7 x weekly - 1 sets - 10 reps - 10-5 sec hold - Tandem Stance with Head Nods   - 2 x daily - 7 x weekly - 1 sets - 10 reps - 10-5 sec hold - Romberg Stance with Eyes Closed  - 2 x daily - 7 x weekly - 1 sets - 10 reps - 10-5 sec hold  ASSESSMENT:  CLINICAL IMPRESSION: Patient demonstrated improved postural stability during balance activity on compliant surface. Metronome utilized during VORx1 exercises to progress head speed. Posterior reactive strategy activity performed with focus on hip strategy and recovery techniques.  OBJECTIVE IMPAIRMENTS: Abnormal gait, decreased activity tolerance, decreased balance, difficulty walking, decreased ROM, decreased strength, postural dysfunction, and obesity.   ACTIVITY LIMITATIONS: carrying, lifting, stairs, locomotion level, and caring for others  PARTICIPATION LIMITATIONS: community activity and yard work  PERSONAL FACTORS: Past/current experiences and Time since onset of injury/illness/exacerbation are also affecting patient's functional outcome.   REHAB POTENTIAL: Good  CLINICAL DECISION MAKING: Evolving/moderate complexity  EVALUATION COMPLEXITY: Moderate   GOALS: Goals reviewed with patient? Yes  SHORT TERM GOALS: Target date: 07/18/2022  Pt will be ind with initial HEP Baseline: Goal status: INITIAL   LONG TERM GOALS: Target date: 08/15/2022   Pt will be ind with progression and advancement of HEP.  Baseline:  Goal status: INITIAL  2.   Pt will demo improved Berg Balance Score to >/=54/56 for low fall risk Baseline:  Goal status: INITIAL  3.  Pt will have improved  FGA to >/=26/30 Baseline:  Goal status: INITIAL  4.  Pt will have improved ABC score to >/=90% Baseline:  Goal status: INITIAL    PLAN:  PT FREQUENCY: 2x/week  PT DURATION: 8 weeks  PLANNED INTERVENTIONS: Therapeutic exercises, Therapeutic activity, Neuromuscular re-education, Balance training, Gait training, Patient/Family education, Self Care, Joint mobilization, Vestibular training, Dry Needling, Electrical stimulation, Spinal mobilization, Cryotherapy, Moist heat, Taping, Ionotophoresis 28m/ml Dexamethasone, Manual therapy, and Re-evaluation  PLAN FOR NEXT SESSION: Work on high level balance -- narrow BOS, unstable surfaces, challenge vestibular system, weight shifting, obstacle negotiation.    KHelane Gunther PTA 06/30/2022

## 2022-07-04 ENCOUNTER — Ambulatory Visit: Payer: Medicare Other

## 2022-07-04 DIAGNOSIS — R2689 Other abnormalities of gait and mobility: Secondary | ICD-10-CM | POA: Diagnosis not present

## 2022-07-04 DIAGNOSIS — R2681 Unsteadiness on feet: Secondary | ICD-10-CM | POA: Diagnosis not present

## 2022-07-04 DIAGNOSIS — R262 Difficulty in walking, not elsewhere classified: Secondary | ICD-10-CM | POA: Diagnosis not present

## 2022-07-04 NOTE — Therapy (Signed)
OUTPATIENT PHYSICAL THERAPY LOWER EXTREMITY TREATMENT   Patient Name: David [name] MRN: [id number] DOB:03-27-1951, 72 y.o., male Today's Date: 07/04/2022  END OF SESSION:  PT End of Session - 07/04/22 1029     Visit Number 5    Date for PT Re-Evaluation 08/15/22    Authorization Type [employer] Medicare    Progress Note Due on Visit 15    PT Start Time 1028   arrived late   PT Stop Time 1115    PT Time Calculation (min) 47 min    Activity Tolerance Patient tolerated treatment well    Behavior During Therapy [facility] for tasks assessed/performed             Past Medical History:  Diagnosis Date   Arthritis    BBB (bundle branch block) 03/15/2016   Carpal tunnel syndrome    DDD (degenerative disc disease), lumbar 03/15/2016   Depression    Dysrhythmia    had some tachycardia with steroids   Elevated cholesterol 03/15/2016   Headache(784.0)    Ocular Migraines - takes Ambien for it   Heart murmur    HTN (hypertension) 03/15/2016   Hyperlipemia    Hypertension    Malignant melanoma of right side of neck ([place]) 03/15/2016   30 years ago    Neuralgia 03/15/2016   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    RLS (restless legs syndrome)    Sleep apnea    could not use a cpap   Snores    Status post gastric banding    Wears glasses    driving   Past Surgical History:  Procedure Laterality Date   ANTERIOR CERVICAL DECOMPRESSION/DISCECTOMY FUSION 4 LEVELS N/A 02/26/2020   Procedure: ANTERIOR CERVICAL DECOMPRESSION/DISCECTOMY FUSION, INTERBODY PROSTHESIS, PLATE/SCREWS CERVICAL THREE-FOUR CERVICAL FOUR-FIVE CERVICAL FIVE-SIX- CERVICAL SIX-SEVEN;  Surgeon: Newman Pies, MD;  Location: [place];  Service: Neurosurgery;  Laterality: N/A;   CARPAL TUNNEL RELEASE Right 09/26/2012   Procedure: CARPAL TUNNEL RELEASE;  Surgeon: Tennis Must, MD;  Location: [place];  Service: Orthopedics;  Laterality:  Right;   CARPAL TUNNEL RELEASE Left 10/24/2012   Procedure: CARPAL TUNNEL RELEASE;  Surgeon: Tennis Must, MD;  Location: [place];  Service: Orthopedics;  Laterality: Left;   EXCISION MELANOMA WITH SENTINEL LYMPH NODE BIOPSY  1984   rt neck with mulpipal nodes and some muscle excised rt neck-shoulder   KNEE ARTHROPLASTY     knee replacement     LAPAROSCOPIC GASTRIC BANDING  03/15/2009   LAPAROSCOPIC GASTRIC BANDING     MUSCLE BIOPSY  2004   rt thigh to r/o myocitis   nerve conduction velosity test     SHOULDER SURGERY Right 12/2021   TONSILLECTOMY     TOTAL KNEE ARTHROPLASTY Bilateral 12/05/2010   TRIGGER FINGER RELEASE Right 09/26/2012   Procedure: RELEASE TRIGGER FINGER/A-1 PULLEY RING AND SMALL ;  Surgeon: Tennis Must, MD;  Location: [place];  Service: Orthopedics;  Laterality: Right;   TRIGGER FINGER RELEASE Left 10/24/2012   Procedure: RELEASE TRIGGER FINGER/A-1 PULLEY LEFT MIDDLE AND LEFT RING;  Surgeon: Tennis Must, MD;  Location: [place];  Service: Orthopedics;  Laterality: Left;   UPPER GI ENDOSCOPY     Patient Active Problem List   Diagnosis Date Noted   Cervical spondylosis with radiculopathy 02/26/2020   SBO (small bowel obstruction) ([facility]) 08/21/2017   Trochanteric bursitis  of left hip 01/18/2017   Lateral epicondylitis, right elbow 01/18/2017   Rheumatoid arthritis ([place]) 03/15/2016   DJD (degenerative joint disease), cervical 03/15/2016   DDD (degenerative disc disease), lumbar 03/15/2016   HTN (hypertension) 03/15/2016   Obesity 03/15/2016   Elevated cholesterol 03/15/2016   Neuralgia 03/15/2016   Malignant melanoma of right side of neck ([place]) 03/15/2016   BBB (bundle branch block) 03/15/2016   High risk medication use 03/15/2016   Osteoarthritis of lumbar spine 03/15/2016   Primary osteoarthritis of both hands 03/15/2016   Primary osteoarthritis of both knees 03/15/2016   Mesenteric adenitis 06/29/2015     PCP: Orpah Melter, MD  REFERRING PROVIDER: Stamey, Girtha Rm, FNP  REFERRING DIAG: R26.9 (ICD-10-CM) - Unspecified abnormalities of gait and mobility  THERAPY DIAG:  Unsteadiness on feet  Difficulty in walking, not elsewhere classified  Other abnormalities of gait and mobility  Rationale for Evaluation and Treatment: Rehabilitation  ONSET DATE: Within the last year  SUBJECTIVE:   SUBJECTIVE STATEMENT:  Patient reports he had no dizziness after last PT session. Patient states he received a replacement cpap machine, states he average 3-4 of sleep at night. Patient states he was siting on a tall stool putting his shoe on when he lost his balance, however he was able to catch himself to prevent fall.   PERTINENT HISTORY: Bilat knee replacements, R shoulder replacement, ACDF (4 levels)  PAIN:  Are you having pain? Yes: NPRS scale: 0 currently, 3 at worst/10 Pain location: R shoulder Pain description: dull/achy Aggravating factors: Reaching Relieving factors: rest/ice  PRECAUTIONS: Fall  WEIGHT BEARING RESTRICTIONS: No  FALLS:  Has patient fallen in last 6 months? Yes. Number of falls 2  LIVING ENVIRONMENT: Lives with: lives with their spouse, daughter and grandchildren are 3 houses down Lives in: House/apartment Stairs: Yes: External: 3 steps; in garage with railing on L Has following equipment at home: None  OCCUPATION: Retired  PLOF: Independent  PATIENT GOALS: Improve balance  NEXT MD VISIT:   OBJECTIVE:   DIAGNOSTIC FINDINGS: none  PATIENT SURVEYS:  ABC scale: 88.43%  COGNITION: Overall cognitive status: Within functional limits for tasks assessed     SENSATION: WFL  MUSCLE LENGTH: Hamstrings: did not assess Thomas test: did not assess  POSTURE: No Significant postural limitations  PALPATION: Nothing relevant to referral for gait  LOWER EXTREMITY ROM: WFL  LOWER EXTREMITY MMT:  MMT Right eval Left eval  Hip flexion 4 4  Hip  extension    Hip abduction 4+ sitting 4+ sitting  Hip adduction 4+ sitting 4+ sitting  Hip internal rotation    Hip external rotation    Knee flexion 5 5  Knee extension 5 5  Ankle dorsiflexion    Ankle plantarflexion 5 5  Ankle inversion    Ankle eversion     (Blank rows = not tested)    FUNCTIONAL TESTS:  5 times sit to stand: 8.74 sec Berg Balance Scale: 49/56 Functional gait assessment: 22/30 mCTSIB: condition 1: 30 sec, condition 2: 30 sec, condition 3: 30 sec, condition 4: 30 sec but with increased sway/unsteadiness    GAIT: Distance walked: To back of clinic Assistive device utilized: None Level of assistance: Complete Independence Comments: [facility]   [facility] Adult PT Treatment:  DATE: 07/04/2022 Neuromuscular re-ed: Fwd amb yaw head turns every 3 steps x160' Fwd tandem amb w/long fixation 2L Staggered stance dynamic balance + yaw head turns 4x10" each --> pTandem  Staggered stance balance on Airex --> slow yaw head turns Fwd march w/cross knee taps x80' Clock Yourself:  Simple colors - 60 SPM x1 min, 70 SPM x 7mn, 80 SPM x1 min Simple clock - 60 SPM x 118m, 70 SPM x 72m36m 80 SPM x 72mi65mtatic standing balance on rocker board --> added slow Y/P head turns Standing cervical retraction to promote proprioceptive awareness/decrease forward head posture    [facility] Adult PT Treatment:                                                DATE: 06/30/2022 Neuromuscular re-ed: Fwd amb yaw head turns every 3 steps 4L Fwd amb VORx1 Y/P head turns (far target) 2L each Standing VORc yaw plane 2x30" Fwd amb VORc yaw plane 2L mREO VORx1 yaw plane: 100bpm x30", 110 bpm x30", 120 bpm x30" mREC Y/P HT x20" each, metronome @ 70 bpm mREO-pREO on airex: VORc yaw plane, 70 bpm 2x30" pREO on airex: VORx1 yaw plane: 00bpm x30", 110 bpm x30", 120 bpm x30" mREC on airex: static standing balance 5x30" Bkwd amb 1L --> posterior balance reactive strategy  (orange SB)    PATIENT EDUCATION:  Education details: Progressing HEP Person educated: Patient Education method: Explanation, Demonstration, and Handouts Education comprehension: verbalized understanding, returned demonstration, and needs further education  HOME EXERCISE PROGRAM: Access Code: [email or web address]: [email or web address] Date: 07/04/2022 Prepared by: KathHelane Guntherercises - Romberg Stance with Head Nods  - 2 x daily - 7 x weekly - 1 sets - 10 reps - 10-5 sec hold - Corner Balance Feet Apart: Eyes Closed With Head Turns  - 2 x daily - 7 x weekly - 1 sets - 10 reps - 10-5 sec hold - Tandem Stance with Head Rotation  - 2 x daily - 7 x weekly - 1 sets - 10 reps - 10-5 sec hold - Tandem Stance with Head Nods   - 2 x daily - 7 x weekly - 1 sets - 10 reps - 10-5 sec hold - Romberg Stance with Eyes Closed  - 2 x daily - 7 x weekly - 1 sets - 10 reps - 10-5 sec hold - Supine Cervical Retraction with Towel  - 1 x daily - 7 x weekly - 3 sets - 10 reps - 3 sec hold - Standing Cervical Retraction  - 1 x daily - 7 x weekly - 3 sets - 10 reps - 3 sec hold  ASSESSMENT:  CLINICAL IMPRESSION: Dynamic balance exercises continued with dual tasking and compliant surfaces. Patient continues to demonstrate occasional posterior LOB with NBOS and dynamic balance activities, utilizing hip and stepping strategies to regain balance.    OBJECTIVE IMPAIRMENTS: Abnormal gait, decreased activity tolerance, decreased balance, difficulty walking, decreased ROM, decreased strength, postural dysfunction, and obesity.   ACTIVITY LIMITATIONS: carrying, lifting, stairs, locomotion level, and caring for others  PARTICIPATION LIMITATIONS: community activity and yard work  PERSONAL FACTORS: Past/current experiences and Time since onset of injury/illness/exacerbation are also affecting patient's functional outcome.   REHAB POTENTIAL: Good  CLINICAL DECISION MAKING: Evolving/moderate  complexity  EVALUATION COMPLEXITY: Moderate   GOALS: Goals reviewed with patient? Yes  SHORT TERM  GOALS: Target date: 07/18/2022  Pt will be ind with initial HEP Baseline: Goal status: INITIAL   LONG TERM GOALS: Target date: 08/15/2022   Pt will be ind with progression and advancement of HEP.  Baseline:  Goal status: INITIAL  2.  Pt will demo improved Berg Balance Score to >/=54/56 for low fall risk Baseline:  Goal status: INITIAL  3.  Pt will have improved FGA to >/=26/30 Baseline:  Goal status: INITIAL  4.  Pt will have improved ABC score to >/=90% Baseline:  Goal status: INITIAL    PLAN:  PT FREQUENCY: 2x/week  PT DURATION: 8 weeks  PLANNED INTERVENTIONS: Therapeutic exercises, Therapeutic activity, Neuromuscular re-education, Balance training, Gait training, Patient/Family education, Self Care, Joint mobilization, Vestibular training, Dry Needling, Electrical stimulation, Spinal mobilization, Cryotherapy, Moist heat, Taping, Ionotophoresis 79m/ml Dexamethasone, Manual therapy, and Re-evaluation  PLAN FOR NEXT SESSION: Work on high level balance -- narrow BOS, unstable surfaces, challenge vestibular system, weight shifting, obstacle negotiation.    KHelane Gunther PTA 07/04/2022

## 2022-07-06 NOTE — Progress Notes (Unsigned)
Office Visit Note  Patient: David [name]             Date of Birth: December 10, 1950           MRN: [id number]             PCP: Sheran Spine, FNP Referring: Orpah Melter, MD Visit Date: 07/20/2022 Occupation: '@GUAROCC'$ @  Subjective:  Medication monitoring  History of Present Illness: David [name] is a 72 y.o. male with history of seronegative rheumatoid arthritis and osteoarthritis.  Patient is currently taking Methotrexate 1.0 ml sq injections once weekly, folic acid 2 mg daily, plaquenil 200 mg 1 tablet twice daily, and sulfasalazine 500 mg 2 tablets po BID.  He has been tolerating triple therapy without any side effects.  Patient reports that he had an updated Plaquenil eye examination about 2 months ago at [facility].  He denies any signs or symptoms of a rheumatoid arthritis flare recently.  He is not experiencing any joint swelling.  His morning stiffness has been lasting for 20 minutes daily.  He denies any nocturnal pain from rheumatoid arthritis.  He has not had any difficulty with ADLs.  Patient reports that he is currently going to physical therapy twice a week to improve his balance.    Activities of Daily Living:  Patient reports morning stiffness for 20 minutes  Patient denies nocturnal pain.  Difficulty dressing/grooming: Denies Difficulty climbing stairs: Denies Difficulty getting out of chair: Denies Difficulty using hands for taps, buttons, cutlery, and/or writing: Denies  Review of Systems  Constitutional: Negative.  Negative for fatigue.  HENT: Negative.  Negative for mouth sores and mouth dryness.   Eyes: Negative.  Negative for dryness.  Respiratory: Negative.  Negative for shortness of breath.   Cardiovascular: Negative.  Negative for chest pain and palpitations.  Gastrointestinal: Negative.  Negative for blood in stool, constipation and diarrhea.  Endocrine: Negative.  Negative for increased urination.  Genitourinary: Negative.  Negative for  involuntary urination.  Musculoskeletal:  Positive for gait problem. Negative for joint pain, joint pain, joint swelling, myalgias, muscle weakness, morning stiffness, muscle tenderness and myalgias.  Skin:  Positive for sensitivity to sunlight. Negative for pallor, rash and hair loss.  Allergic/Immunologic: Negative.  Negative for susceptible to infections.  Neurological:  Positive for dizziness. Negative for headaches.  Hematological: Negative.  Negative for swollen glands.  Psychiatric/Behavioral:  Positive for sleep disturbance. Negative for depressed mood. The patient is not nervous/anxious.     PMFS History:  Patient Active Problem List   Diagnosis Date Noted   Cervical spondylosis with radiculopathy 02/26/2020   SBO (small bowel obstruction) ([place]) 08/21/2017   Trochanteric bursitis of left hip 01/18/2017   Lateral epicondylitis, right elbow 01/18/2017   Rheumatoid arthritis ([place]) 03/15/2016   DJD (degenerative joint disease), cervical 03/15/2016   DDD (degenerative disc disease), lumbar 03/15/2016   HTN (hypertension) 03/15/2016   Obesity 03/15/2016   Elevated cholesterol 03/15/2016   Neuralgia 03/15/2016   Malignant melanoma of right side of neck ([place]) 03/15/2016   BBB (bundle branch block) 03/15/2016   High risk medication use 03/15/2016   Osteoarthritis of lumbar spine 03/15/2016   Primary osteoarthritis of both hands 03/15/2016   Primary osteoarthritis of both knees 03/15/2016   Mesenteric adenitis 06/29/2015    Past Medical History:  Diagnosis Date   Arthritis    Balance problem    BBB (bundle branch block) 03/15/2016   Carpal tunnel syndrome    DDD (degenerative disc disease), lumbar  03/15/2016   Depression    Dysrhythmia    had some tachycardia with steroids   Elevated cholesterol 03/15/2016   Headache(784.0)    Ocular Migraines - takes Ambien for it   Heart murmur    HTN (hypertension) 03/15/2016   Hyperlipemia    Hypertension    Malignant melanoma of  right side of neck ([place]) 03/15/2016   30 years ago    Neuralgia 03/15/2016   Ocular migraine    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    RLS (restless legs syndrome)    Sleep apnea    could not use a cpap   Snores    Status post gastric banding    Wears glasses    driving    Family History  Problem Relation Age of Onset   Heart disease Mother    Alzheimer's disease Father    Past Surgical History:  Procedure Laterality Date   ANTERIOR CERVICAL DECOMPRESSION/DISCECTOMY FUSION 4 LEVELS N/A 02/26/2020   Procedure: ANTERIOR CERVICAL DECOMPRESSION/DISCECTOMY FUSION, INTERBODY PROSTHESIS, PLATE/SCREWS CERVICAL THREE-FOUR CERVICAL FOUR-FIVE CERVICAL FIVE-SIX- CERVICAL SIX-SEVEN;  Surgeon: Newman Pies, MD;  Location: [place];  Service: Neurosurgery;  Laterality: N/A;   CARPAL TUNNEL RELEASE Right 09/26/2012   Procedure: CARPAL TUNNEL RELEASE;  Surgeon: Tennis Must, MD;  Location: [place];  Service: Orthopedics;  Laterality: Right;   CARPAL TUNNEL RELEASE Left 10/24/2012   Procedure: CARPAL TUNNEL RELEASE;  Surgeon: Tennis Must, MD;  Location: [place];  Service: Orthopedics;  Laterality: Left;   EXCISION MELANOMA WITH SENTINEL LYMPH NODE BIOPSY  1984   rt neck with mulpipal nodes and some muscle excised rt neck-shoulder   KNEE ARTHROPLASTY     knee replacement     LAPAROSCOPIC GASTRIC BANDING  03/15/2009   LAPAROSCOPIC GASTRIC BANDING     MUSCLE BIOPSY  2004   rt thigh to r/o myocitis   nerve conduction velosity test     SHOULDER SURGERY Right 12/2021   TONSILLECTOMY     TOTAL KNEE ARTHROPLASTY Bilateral 12/05/2010   TRIGGER FINGER RELEASE Right 09/26/2012   Procedure: RELEASE TRIGGER FINGER/A-1 PULLEY RING AND SMALL ;  Surgeon: Tennis Must, MD;  Location: [place];  Service: Orthopedics;  Laterality: Right;   TRIGGER FINGER RELEASE Left 10/24/2012    Procedure: RELEASE TRIGGER FINGER/A-1 PULLEY LEFT MIDDLE AND LEFT RING;  Surgeon: Tennis Must, MD;  Location: [place];  Service: Orthopedics;  Laterality: Left;   UPPER GI ENDOSCOPY     Social History   Social History Narrative   Not on file   Immunization History  Administered Date(s) Administered   Moderna Sars-Covid-2 Vaccination 06/10/2019, 07/08/2019   PFIZER(Purple Top)SARS-COV-2 Vaccination 05/21/2020     Objective: Vital Signs: BP 118/78 (BP Location: Left Arm, Patient Position: Sitting, Cuff Size: Large)   Pulse 73   Resp 14   Ht '5\' 8"'$  (1.727 m)   Wt 259 lb (117.5 kg)   BMI 39.38 kg/m    Physical Exam Vitals and nursing note reviewed.  Constitutional:      Appearance: He is well-developed.  HENT:     Head: Normocephalic and atraumatic.  Eyes:     Conjunctiva/sclera: Conjunctivae normal.     Pupils: Pupils are equal, round, and reactive to light.  Cardiovascular:     Rate and Rhythm: Normal rate and regular rhythm.     Heart  sounds: Normal heart sounds.  Pulmonary:     Effort: Pulmonary effort is normal.     Breath sounds: Normal breath sounds.  Abdominal:     General: Bowel sounds are normal.     Palpations: Abdomen is soft.  Musculoskeletal:     Cervical back: Normal range of motion and neck supple.  Skin:    General: Skin is warm and dry.     Capillary Refill: Capillary refill takes less than 2 seconds.  Neurological:     Mental Status: He is alert and oriented to person, place, and time.  Psychiatric:        Behavior: Behavior normal.      Musculoskeletal Exam: C-spine limited ROM with lateral rotation.  Right shoulder limited abduction to 90 degrees.  Left shoulder has good ROM with no discomfort.  Elbow joints, wrist joints, MCPs, PIPs, and DIPs good ROM with no synovitis.  PIP and DIP thickening consistent with osteoarthritis of both hands.  CMC joint prominence.  Hip joints good ROM with no groin pain.  Knee replacements have  good ROM with no warmth or effusion.  Ankle joints good ROM with no tenderness or joint swelling.    CDAI Exam: CDAI Score: -- Patient Global: 3 mm; Provider Global: 3 mm Swollen: --; Tender: -- Joint Exam 07/20/2022   No joint exam has been documented for this visit   There is currently no information documented on the homunculus. Go to the Rheumatology activity and complete the homunculus joint exam.  Investigation: No additional findings.  Imaging: CT CARDIAC SCORING (SELF PAY ONLY)  Addendum Date: 07/05/2022   ADDENDUM REPORT: 07/05/2022 06:58 EXAM: OVER-READ INTERPRETATION  CT CHEST The following report is an over-read performed by radiologist Dr. Rebekah Chesterfield [facility] on 07/05/2022. This over-read does not include interpretation of cardiac or coronary anatomy or pathology. The coronary calcium score interpretation by the cardiologist is attached. COMPARISON:  None. FINDINGS: Mild linear scarring in the lung bases bilaterally. LapBand incompletely imaged. Within the visualized portions of the thorax there are no suspicious appearing pulmonary nodules or masses, there is no acute consolidative airspace disease, no pleural effusions, no pneumothorax and no lymphadenopathy. Visualized portions of the upper abdomen are unremarkable. There are no aggressive appearing lytic or blastic lesions noted in the visualized portions of the skeleton. IMPRESSION: 1. No significant incidental noncardiac findings are noted. Electronically Signed   By: Vinnie Langton M.D.   On: 07/05/2022 06:58   Result Date: 07/05/2022 CLINICAL DATA:  Cardiovascular Disease Risk stratification EXAM: Coronary Calcium Score TECHNIQUE: A gated, non-contrast computed tomography scan of the heart was performed using 24m slice thickness. Axial images were analyzed on a dedicated workstation. Calcium scoring of the coronary arteries was performed using the Agatston method. FINDINGS: Coronary arteries: Normal  origins. Coronary Calcium Score: Left main: 0 Left anterior descending artery: 188 Left circumflex artery: 109 Right coronary artery: 70 Total: 367 Percentile: 67 Pericardium: Normal. Aorta: Borderline dilated caliber of ascending aorta (38 mm). Aortic atherosclerosis noted. [facility]-cardiac: [facility]. IMPRESSION: Coronary calcium score of 367. This was 67th percentile for age-, race-, and sex-matched controls. Aorta: Borderline dilated caliber of ascending aorta (38 mm). Aortic atherosclerosis noted. RECOMMENDATIONS: Coronary artery calcium (CAC) score is a strong predictor of incident coronary heart disease (CHD) and provides predictive information beyond traditional risk factors. CAC scoring is reasonable to use in the decision to withhold, postpone, or initiate statin therapy in intermediate-risk or selected borderline-risk asymptomatic adults (age  40-75 years and LDL-C >=70 to <190 mg/dL) who do not have diabetes or established atherosclerotic cardiovascular disease (ASCVD).* In intermediate-risk (10-year ASCVD risk >=7.5% to <20%) adults or selected borderline-risk (10-year ASCVD risk >=5% to <7.5%) adults in whom a CAC score is measured for the purpose of making a treatment decision the following recommendations have been made: If CAC=0, it is reasonable to withhold statin therapy and reassess in 5 to 10 years, as long as higher risk conditions are absent (diabetes mellitus, family history of premature CHD in first degree relatives (males <55 years; females <65 years), cigarette smoking, or LDL >=190 mg/dL). If CAC is 1 to 99, it is reasonable to initiate statin therapy for patients >=62 years of age. If CAC is >=100 or >=75th percentile, it is reasonable to initiate statin therapy at any age. Cardiology referral should be considered for patients with CAC scores >=400 or >=75th percentile. *2018 AHA/ACC/AACVPR/AAPA/ABC/ACPM/ADA/AGS/APhA/ASPC/NLA/PCNA Guideline on the Management  of Blood Cholesterol: A Report of the American College of Cardiology/American Heart Association Task Force on Clinical Practice Guidelines. J Am Coll Cardiol. 2019;73(24):3168-3209. Buford Dresser, MD Electronically Signed: By: Buford Dresser M.D. On: 07/04/2022 07:06    Recent Labs: Lab Results  Component Value Date   WBC 7.0 05/17/2022   HGB 14.8 05/17/2022   PLT 269 05/17/2022   NA 138 05/17/2022   K 3.7 05/17/2022   CL 99 05/17/2022   CO2 34 (H) 05/17/2022   GLUCOSE 106 (H) 05/17/2022   BUN 12 05/17/2022   CREATININE 1.26 (H) 05/17/2022   BILITOT 0.5 02/08/2022   ALKPHOS 72 09/14/2018   AST 33 02/08/2022   ALT 29 02/08/2022   PROT 7.3 02/08/2022   ALBUMIN 3.8 09/14/2018   CALCIUM 9.3 05/17/2022   GFRAA 91 11/12/2020    Speciality Comments: PLQ Eye Exam: 02/11/2020 WNL @ [facility]  F/u in 1 year.   Procedures:  No procedures performed Allergies: Codeine, Dilaudid [hydromorphone hcl], Fentanyl, Nucynta [tapentadol], and Penicillins   Assessment / Plan:     Visit Diagnoses: Rheumatoid arthritis of multiple sites with negative rheumatoid factor ([place]) -He has no synovitis on examination today.  He has not had any signs or symptoms of a rheumatoid arthritis flare.  He has clinically been doing well on triple therapy: Methotrexate 1.0 mL subcu injections once weekly, Plaquenil 200 mg 1 tablet by mouth twice daily, and sulfasalazine 500 mg 2 tablets by mouth twice daily.  He continues to tolerate these medications without any side effects and has not missed any doses recently.  His morning stiffness has been lasting about 20 minutes daily and is alleviated by taking a hot shower in the mornings.  He has not been experiencing any nocturnal pain from rheumatoid arthritis.  He has not had any difficulty with ADLs.  He will remain on triple therapy as prescribed.  A refill of methotrexate and Plaquenil be sent to the pharmacy today.  He was advised to notify us if he develops  signs or symptoms of a flare.  He will follow-up in the office in 5 months or sooner if needed.  Plan: hydroxychloroquine (PLAQUENIL) 200 MG tablet  High risk medication use - Methotrexate 1.0 ml sq injections once weekly, folic acid 2 mg daily, plaquenil 200 mg 1 tablet twice daily, and sulfasalazine 500 mg 2 tablets po BID. BMP and CBC updated on 05/17/22. Orders for CBC and CMP released today. Patient will continue to require updated lab work every 3 months.  No recent or  recurrent infections.  Discussed the importance of holding methotrexate and sulfasalazine if he develops signs or symptoms of an infection and to resume once the infection has completely cleared.   PLQ Eye Exam: 02/11/2020 WNL @ [facility] F/u in 1 year.  Patient had an updated Plaquenil eye examination about 2 months ago at [facility].  We will call to obtain these records.  - Plan: CBC with Differential/Platelet, COMPLETE METABOLIC PANEL WITH GFR, hydroxychloroquine (PLAQUENIL) 200 MG tablet According to the patient he had the annual flu shot as well as the first Shingrix vaccine of the series.  He is planning on getting the second vaccine as well as will be proceeding with the Prevnar 20 vaccine in the future.  Primary osteoarthritis of both hands: PIP and DIP consistent with osteoarthritis of both hands.  CMC joint prominence and thickening noted bilaterally.  Complete fist formation bilaterally.  No tenderness or inflammation noted.  He has not had any difficulty with ADLs.  Arthritis of both acromioclavicular joints - S/p arthroscopic surgery-right-Dr. Ninfa Linden.  Limited abduction of the right shoulder to about 90 degrees.    Hx of total knee replacement, bilateral - Dr. Jose Persia well.  Good ROM with no discomfort.    DDD (degenerative disc disease), cervical: Limited ROM with lateral rotation.   DDD (degenerative disc disease), lumbar: Currently going to PT twice weekly to improve his balance.   Other medical  conditions are listed as follows:   History of melanoma    Orders: Orders Placed This Encounter  Procedures   CBC with Differential/Platelet   COMPLETE METABOLIC PANEL WITH GFR   Meds ordered this encounter  Medications   hydroxychloroquine (PLAQUENIL) 200 MG tablet    Sig: Take 1 tablet (200 mg total) by mouth 2 (two) times daily.    Dispense:  180 tablet    Refill:  0   methotrexate 50 MG/2ML injection    Sig: INJECT 1ML UNDER THE SKIN ONCE WEEKLY    Dispense:  12 mL    Refill:  0      Follow-Up Instructions: Return in about 5 months (around 12/18/2022) for Rheumatoid arthritis, Osteoarthritis.   Ofilia Neas, PA-C  Note - This record has been created using Dragon software.  Chart creation errors have been sought, but may not always  have been located. Such creation errors do not reflect on  the standard of medical care.

## 2022-07-07 ENCOUNTER — Ambulatory Visit: Payer: Medicare Other

## 2022-07-07 DIAGNOSIS — R2689 Other abnormalities of gait and mobility: Secondary | ICD-10-CM | POA: Diagnosis not present

## 2022-07-07 DIAGNOSIS — R262 Difficulty in walking, not elsewhere classified: Secondary | ICD-10-CM | POA: Diagnosis not present

## 2022-07-07 DIAGNOSIS — R2681 Unsteadiness on feet: Secondary | ICD-10-CM | POA: Diagnosis not present

## 2022-07-07 NOTE — Therapy (Signed)
OUTPATIENT PHYSICAL THERAPY LOWER EXTREMITY TREATMENT   Patient Name: David [name] MRN: [id number] DOB:15-Apr-1951, 72 y.o., male Today's Date: 07/07/2022  END OF SESSION:  PT End of Session - 07/07/22 1017     Visit Number 6    Date for PT Re-Evaluation 08/15/22    Authorization Type BCBS Medicare    Progress Note Due on Visit 15    PT Start Time 1015    PT Stop Time 1056    PT Time Calculation (min) 41 min    Activity Tolerance Patient tolerated treatment well    Behavior During Therapy WFL for tasks assessed/performed             Past Medical History:  Diagnosis Date   Arthritis    BBB (bundle branch block) 03/15/2016   Carpal tunnel syndrome    DDD (degenerative disc disease), lumbar 03/15/2016   Depression    Dysrhythmia    had some tachycardia with steroids   Elevated cholesterol 03/15/2016   Headache(784.0)    Ocular Migraines - takes Ambien for it   Heart murmur    HTN (hypertension) 03/15/2016   Hyperlipemia    Hypertension    Malignant melanoma of right side of neck ([place]) 03/15/2016   30 years ago    Neuralgia 03/15/2016   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    RLS (restless legs syndrome)    Sleep apnea    could not use a cpap   Snores    Status post gastric banding    Wears glasses    driving   Past Surgical History:  Procedure Laterality Date   ANTERIOR CERVICAL DECOMPRESSION/DISCECTOMY FUSION 4 LEVELS N/A 02/26/2020   Procedure: ANTERIOR CERVICAL DECOMPRESSION/DISCECTOMY FUSION, INTERBODY PROSTHESIS, PLATE/SCREWS CERVICAL THREE-FOUR CERVICAL FOUR-FIVE CERVICAL FIVE-SIX- CERVICAL SIX-SEVEN;  Surgeon: Newman Pies, MD;  Location: [place];  Service: Neurosurgery;  Laterality: N/A;   CARPAL TUNNEL RELEASE Right 09/26/2012   Procedure: CARPAL TUNNEL RELEASE;  Surgeon: Tennis Must, MD;  Location: [place];  Service: Orthopedics;  Laterality: Right;    CARPAL TUNNEL RELEASE Left 10/24/2012   Procedure: CARPAL TUNNEL RELEASE;  Surgeon: Tennis Must, MD;  Location: [place];  Service: Orthopedics;  Laterality: Left;   EXCISION MELANOMA WITH SENTINEL LYMPH NODE BIOPSY  1984   rt neck with mulpipal nodes and some muscle excised rt neck-shoulder   KNEE ARTHROPLASTY     knee replacement     LAPAROSCOPIC GASTRIC BANDING  03/15/2009   LAPAROSCOPIC GASTRIC BANDING     MUSCLE BIOPSY  2004   rt thigh to r/o myocitis   nerve conduction velosity test     SHOULDER SURGERY Right 12/2021   TONSILLECTOMY     TOTAL KNEE ARTHROPLASTY Bilateral 12/05/2010   TRIGGER FINGER RELEASE Right 09/26/2012   Procedure: RELEASE TRIGGER FINGER/A-1 PULLEY RING AND SMALL ;  Surgeon: Tennis Must, MD;  Location: [place];  Service: Orthopedics;  Laterality: Right;   TRIGGER FINGER RELEASE Left 10/24/2012   Procedure: RELEASE TRIGGER FINGER/A-1 PULLEY LEFT MIDDLE AND LEFT RING;  Surgeon: Tennis Must, MD;  Location: [place];  Service: Orthopedics;  Laterality: Left;   UPPER GI ENDOSCOPY     Patient Active Problem List   Diagnosis Date Noted   Cervical spondylosis with radiculopathy 02/26/2020   SBO (small bowel obstruction) ([place]) 08/21/2017   Trochanteric bursitis of left hip  01/18/2017   Lateral epicondylitis, right elbow 01/18/2017   Rheumatoid arthritis ([place]) 03/15/2016   DJD (degenerative joint disease), cervical 03/15/2016   DDD (degenerative disc disease), lumbar 03/15/2016   HTN (hypertension) 03/15/2016   Obesity 03/15/2016   Elevated cholesterol 03/15/2016   Neuralgia 03/15/2016   Malignant melanoma of right side of neck ([place]) 03/15/2016   BBB (bundle branch block) 03/15/2016   High risk medication use 03/15/2016   Osteoarthritis of lumbar spine 03/15/2016   Primary osteoarthritis of both hands 03/15/2016   Primary osteoarthritis of both knees 03/15/2016   Mesenteric adenitis 06/29/2015    PCP:  Orpah Melter, MD  REFERRING PROVIDER: Stamey, Girtha Rm, FNP  REFERRING DIAG: R26.9 (ICD-10-CM) - Unspecified abnormalities of gait and mobility  THERAPY DIAG:  Unsteadiness on feet  Difficulty in walking, not elsewhere classified  Other abnormalities of gait and mobility  Rationale for Evaluation and Treatment: Rehabilitation  ONSET DATE: Within the last year  SUBJECTIVE:   SUBJECTIVE STATEMENT:  Patient reports he had no dizziness after last visit. Patient states he got 7 hours of sleep last night and he felt "blurry" when he woke up.   PERTINENT HISTORY: Bilat knee replacements, R shoulder replacement, ACDF (4 levels)  PAIN:  Are you having pain? Yes: NPRS scale: 0 currently, 3 at worst/10 Pain location: R shoulder Pain description: dull/achy Aggravating factors: Reaching Relieving factors: rest/ice  PRECAUTIONS: Fall  WEIGHT BEARING RESTRICTIONS: No  FALLS:  Has patient fallen in last 6 months? Yes. Number of falls 2  LIVING ENVIRONMENT: Lives with: lives with their spouse, daughter and grandchildren are 3 houses down Lives in: House/apartment Stairs: Yes: External: 3 steps; in garage with railing on L Has following equipment at home: None  OCCUPATION: Retired  PLOF: Independent  PATIENT GOALS: Improve balance  NEXT MD VISIT:   OBJECTIVE:   DIAGNOSTIC FINDINGS: none  PATIENT SURVEYS:  ABC scale: 88.43%  COGNITION: Overall cognitive status: Within functional limits for tasks assessed     SENSATION: WFL  MUSCLE LENGTH: Hamstrings: did not assess Thomas test: did not assess  POSTURE: No Significant postural limitations  PALPATION: Nothing relevant to referral for gait  LOWER EXTREMITY ROM: WFL  LOWER EXTREMITY MMT:  MMT Right eval Left eval  Hip flexion 4 4  Hip extension    Hip abduction 4+ sitting 4+ sitting  Hip adduction 4+ sitting 4+ sitting  Hip internal rotation    Hip external rotation    Knee flexion 5 5  Knee  extension 5 5  Ankle dorsiflexion    Ankle plantarflexion 5 5  Ankle inversion    Ankle eversion     (Blank rows = not tested)    FUNCTIONAL TESTS:  5 times sit to stand: 8.74 sec Berg Balance Scale: 49/56 Functional gait assessment: 22/30 mCTSIB: condition 1: 30 sec, condition 2: 30 sec, condition 3: 30 sec, condition 4: 30 sec but with increased sway/unsteadiness    GAIT: Distance walked: To back of clinic Assistive device utilized: None Level of assistance: Complete Independence Comments: [facility]   [facility] Adult PT Treatment:                                                DATE: 07/06/2022 Neuromuscular re-ed: Fwd amb yaw head turns every 3 steps x160' pTandem amb w/long fixation 2L --> added yaw head turns every 3  steps Tandem balance --> added slow yaw head turns Airex: pREO yaw head turns 4x10" --> 4x5" --> 8x3" Airex: pREC static standing x10" --> added Y/P HT  Rocker board: EO static standing --> Y/P head turns Staggered stance A/P weight shifting --> added yaw head turns Quick weight shifting/crossing midline: color dots --> clock #s (call out by therapist)   [facility] Adult PT Treatment:                                                DATE: 07/04/2022 Neuromuscular re-ed: Fwd amb yaw head turns every 3 steps x160' Fwd tandem amb w/long fixation 2L Staggered stance dynamic balance + yaw head turns 4x10" each --> pTandem  Staggered stance balance on Airex --> slow yaw head turns Fwd march w/cross knee taps x80' Clock Yourself:  Simple colors - 60 SPM x1 min, 70 SPM x 57mn, 80 SPM x1 min Simple clock - 60 SPM x 172m, 70 SPM x 61m18m 80 SPM x 61mi44mtatic standing balance on rocker board --> added slow Y/P head turns Standing cervical retraction to promote proprioceptive awareness/decrease forward head posture   PATIENT EDUCATION:  Education details: Progressing HEP Person educated: Patient Education method: Explanation, Demonstration, and Handouts Education comprehension:  verbalized understanding, returned demonstration, and needs further education  HOME EXERCISE PROGRAM: Access Code: [email or web address]: [email or web address] Date: 07/04/2022 Prepared by: KathHelane Guntherercises - Romberg Stance with Head Nods  - 2 x daily - 7 x weekly - 1 sets - 10 reps - 10-5 sec hold - Corner Balance Feet Apart: Eyes Closed With Head Turns  - 2 x daily - 7 x weekly - 1 sets - 10 reps - 10-5 sec hold - Tandem Stance with Head Rotation  - 2 x daily - 7 x weekly - 1 sets - 10 reps - 10-5 sec hold - Tandem Stance with Head Nods   - 2 x daily - 7 x weekly - 1 sets - 10 reps - 10-5 sec hold - Romberg Stance with Eyes Closed  - 2 x daily - 7 x weekly - 1 sets - 10 reps - 10-5 sec hold - Supine Cervical Retraction with Towel  - 1 x daily - 7 x weekly - 3 sets - 10 reps - 3 sec hold - Standing Cervical Retraction  - 1 x daily - 7 x weekly - 3 sets - 10 reps - 3 sec hold  ASSESSMENT:  CLINICAL IMPRESSION: Patient demonstrated improved ankle strategy to maintain balance on compliant surfaces. Cueing improved front leg stability during anterior weight shifting in staggered stance; postural stability challenged with added head turns in yaw plane with weight shifting.   OBJECTIVE IMPAIRMENTS: Abnormal gait, decreased activity tolerance, decreased balance, difficulty walking, decreased ROM, decreased strength, postural dysfunction, and obesity.   ACTIVITY LIMITATIONS: carrying, lifting, stairs, locomotion level, and caring for others  PARTICIPATION LIMITATIONS: community activity and yard work  PERSONAL FACTORS: Past/current experiences and Time since onset of injury/illness/exacerbation are also affecting patient's functional outcome.   REHAB POTENTIAL: Good  CLINICAL DECISION MAKING: Evolving/moderate complexity  EVALUATION COMPLEXITY: Moderate   GOALS: Goals reviewed with patient? Yes  SHORT TERM GOALS: Target date: 07/18/2022  Pt will be ind with initial  HEP Baseline: Goal status: INITIAL   LONG TERM GOALS: Target date: 08/15/2022  Pt will be ind with progression and  advancement of HEP.  Baseline:  Goal status: INITIAL  2.  Pt will demo improved Berg Balance Score to >/=54/56 for low fall risk Baseline:  Goal status: INITIAL  3.  Pt will have improved FGA to >/=26/30 Baseline:  Goal status: INITIAL  4.  Pt will have improved ABC score to >/=90% Baseline:  Goal status: INITIAL    PLAN:  PT FREQUENCY: 2x/week  PT DURATION: 8 weeks  PLANNED INTERVENTIONS: Therapeutic exercises, Therapeutic activity, Neuromuscular re-education, Balance training, Gait training, Patient/Family education, Self Care, Joint mobilization, Vestibular training, Dry Needling, Electrical stimulation, Spinal mobilization, Cryotherapy, Moist heat, Taping, Ionotophoresis 73m/ml Dexamethasone, Manual therapy, and Re-evaluation  PLAN FOR NEXT SESSION: Work on high level balance -- narrow BOS, unstable surfaces, challenge vestibular system, weight shifting, obstacle negotiation.    KHelane Gunther PTA 07/07/2022

## 2022-07-11 ENCOUNTER — Ambulatory Visit: Payer: Medicare Other

## 2022-07-11 DIAGNOSIS — R2681 Unsteadiness on feet: Secondary | ICD-10-CM | POA: Diagnosis not present

## 2022-07-11 DIAGNOSIS — R2689 Other abnormalities of gait and mobility: Secondary | ICD-10-CM | POA: Diagnosis not present

## 2022-07-11 DIAGNOSIS — R262 Difficulty in walking, not elsewhere classified: Secondary | ICD-10-CM | POA: Diagnosis not present

## 2022-07-11 NOTE — Therapy (Signed)
OUTPATIENT PHYSICAL THERAPY LOWER EXTREMITY TREATMENT   Patient Name: David [name] MRN: [id number] DOB:Aug 03, 1950, 72 y.o., male Today's Date: 07/11/2022  END OF SESSION:  PT End of Session - 07/11/22 1017     Visit Number 7    Date for PT Re-Evaluation 08/15/22    Authorization Type BCBS Medicare    Progress Note Due on Visit 15    PT Start Time 1017    PT Stop Time 1100    PT Time Calculation (min) 43 min    Activity Tolerance Patient tolerated treatment well    Behavior During Therapy WFL for tasks assessed/performed             Past Medical History:  Diagnosis Date   Arthritis    BBB (bundle branch block) 03/15/2016   Carpal tunnel syndrome    DDD (degenerative disc disease), lumbar 03/15/2016   Depression    Dysrhythmia    had some tachycardia with steroids   Elevated cholesterol 03/15/2016   Headache(784.0)    Ocular Migraines - takes Ambien for it   Heart murmur    HTN (hypertension) 03/15/2016   Hyperlipemia    Hypertension    Malignant melanoma of right side of neck ([place]) 03/15/2016   30 years ago    Neuralgia 03/15/2016   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    RLS (restless legs syndrome)    Sleep apnea    could not use a cpap   Snores    Status post gastric banding    Wears glasses    driving   Past Surgical History:  Procedure Laterality Date   ANTERIOR CERVICAL DECOMPRESSION/DISCECTOMY FUSION 4 LEVELS N/A 02/26/2020   Procedure: ANTERIOR CERVICAL DECOMPRESSION/DISCECTOMY FUSION, INTERBODY PROSTHESIS, PLATE/SCREWS CERVICAL THREE-FOUR CERVICAL FOUR-FIVE CERVICAL FIVE-SIX- CERVICAL SIX-SEVEN;  Surgeon: Newman Pies, MD;  Location: [place];  Service: Neurosurgery;  Laterality: N/A;   CARPAL TUNNEL RELEASE Right 09/26/2012   Procedure: CARPAL TUNNEL RELEASE;  Surgeon: Tennis Must, MD;  Location: [place];  Service: Orthopedics;  Laterality: Right;    CARPAL TUNNEL RELEASE Left 10/24/2012   Procedure: CARPAL TUNNEL RELEASE;  Surgeon: Tennis Must, MD;  Location: [place];  Service: Orthopedics;  Laterality: Left;   EXCISION MELANOMA WITH SENTINEL LYMPH NODE BIOPSY  1984   rt neck with mulpipal nodes and some muscle excised rt neck-shoulder   KNEE ARTHROPLASTY     knee replacement     LAPAROSCOPIC GASTRIC BANDING  03/15/2009   LAPAROSCOPIC GASTRIC BANDING     MUSCLE BIOPSY  2004   rt thigh to r/o myocitis   nerve conduction velosity test     SHOULDER SURGERY Right 12/2021   TONSILLECTOMY     TOTAL KNEE ARTHROPLASTY Bilateral 12/05/2010   TRIGGER FINGER RELEASE Right 09/26/2012   Procedure: RELEASE TRIGGER FINGER/A-1 PULLEY RING AND SMALL ;  Surgeon: Tennis Must, MD;  Location: [place];  Service: Orthopedics;  Laterality: Right;   TRIGGER FINGER RELEASE Left 10/24/2012   Procedure: RELEASE TRIGGER FINGER/A-1 PULLEY LEFT MIDDLE AND LEFT RING;  Surgeon: Tennis Must, MD;  Location: [place];  Service: Orthopedics;  Laterality: Left;   UPPER GI ENDOSCOPY     Patient Active Problem List   Diagnosis Date Noted   Cervical spondylosis with radiculopathy 02/26/2020   SBO (small bowel obstruction) ([place]) 08/21/2017   Trochanteric bursitis of left hip  01/18/2017   Lateral epicondylitis, right elbow 01/18/2017   Rheumatoid arthritis ([place]) 03/15/2016   DJD (degenerative joint disease), cervical 03/15/2016   DDD (degenerative disc disease), lumbar 03/15/2016   HTN (hypertension) 03/15/2016   Obesity 03/15/2016   Elevated cholesterol 03/15/2016   Neuralgia 03/15/2016   Malignant melanoma of right side of neck ([place]) 03/15/2016   BBB (bundle branch block) 03/15/2016   High risk medication use 03/15/2016   Osteoarthritis of lumbar spine 03/15/2016   Primary osteoarthritis of both hands 03/15/2016   Primary osteoarthritis of both knees 03/15/2016   Mesenteric adenitis 06/29/2015    PCP:  Orpah Melter, MD  REFERRING PROVIDER: Stamey, Girtha Rm, FNP  REFERRING DIAG: R26.9 (ICD-10-CM) - Unspecified abnormalities of gait and mobility  THERAPY DIAG:  Unsteadiness on feet  Difficulty in walking, not elsewhere classified  Other abnormalities of gait and mobility  Rationale for Evaluation and Treatment: Rehabilitation  ONSET DATE: Within the last year  SUBJECTIVE:   SUBJECTIVE STATEMENT:  Patient reports his R eye is blurry, states he thinks it is due to glaucoma.    PERTINENT HISTORY: Bilat knee replacements, R shoulder replacement, ACDF (4 levels)  PAIN:  Are you having pain? Yes: NPRS scale: 0 currently, 3 at worst/10 Pain location: R shoulder Pain description: dull/achy Aggravating factors: Reaching Relieving factors: rest/ice  PRECAUTIONS: Fall  WEIGHT BEARING RESTRICTIONS: No  FALLS:  Has patient fallen in last 6 months? Yes. Number of falls 2  LIVING ENVIRONMENT: Lives with: lives with their spouse, daughter and grandchildren are 3 houses down Lives in: House/apartment Stairs: Yes: External: 3 steps; in garage with railing on L Has following equipment at home: None  OCCUPATION: Retired  PLOF: Independent  PATIENT GOALS: Improve balance  NEXT MD VISIT:   OBJECTIVE:   DIAGNOSTIC FINDINGS: none  PATIENT SURVEYS:  ABC scale: 88.43%  COGNITION: Overall cognitive status: Within functional limits for tasks assessed     SENSATION: WFL  MUSCLE LENGTH: Hamstrings: did not assess Thomas test: did not assess  POSTURE: No Significant postural limitations  PALPATION: Nothing relevant to referral for gait  LOWER EXTREMITY ROM: WFL  LOWER EXTREMITY MMT:  MMT Right eval Left eval  Hip flexion 4 4  Hip extension    Hip abduction 4+ sitting 4+ sitting  Hip adduction 4+ sitting 4+ sitting  Hip internal rotation    Hip external rotation    Knee flexion 5 5  Knee extension 5 5  Ankle dorsiflexion    Ankle plantarflexion 5 5   Ankle inversion    Ankle eversion     (Blank rows = not tested)    FUNCTIONAL TESTS:  5 times sit to stand: 8.74 sec Berg Balance Scale: 49/56 Functional gait assessment: 22/30 mCTSIB: condition 1: 30 sec, condition 2: 30 sec, condition 3: 30 sec, condition 4: 30 sec but with increased sway/unsteadiness    GAIT: Distance walked: To back of clinic Assistive device utilized: None Level of assistance: Complete Independence Comments: [facility]  [facility] Adult PT Treatment:                                                DATE: 07/11/2022 Neuromuscular re-ed: Fwd amb yaw head turns every 3 steps x160' --> added 15#KB suitcase carry Backward walking --> added 15#KB suitcase carry --> Fwd/bkwd amb no weight Tandem amb w/long fixation 2L --> added  yaw head turns every 3 steps Blue Mat: Clock Yourself: simple colors - 70 SPM x1 min, 80 SPM x 49mn, 90 SPM x1 min Modified SLB x30" each --> SLB 3x30" B (fingertip touch) SLB on even ground x30" B Fwd tandem amb w/long fixation   [facility] Adult PT Treatment:                                                DATE: 07/06/2022 Neuromuscular re-ed: Fwd amb yaw head turns every 3 steps x160' pTandem amb w/long fixation 2L --> added yaw head turns every 3 steps Tandem balance --> added slow yaw head turns Airex: pREO yaw head turns 4x10" --> 4x5" --> 8x3" Airex: pREC static standing x10" --> added Y/P HT  Rocker board: EO static standing --> Y/P head turns Staggered stance A/P weight shifting --> added yaw head turns Quick weight shifting/crossing midline: color dots --> clock #s (call out by therapist)   PATIENT EDUCATION:  Education details: Progressing HEP Person educated: Patient Education method: Explanation, Demonstration, and Handouts Education comprehension: verbalized understanding, returned demonstration, and needs further education  HOME EXERCISE PROGRAM: Access Code: [email or web address]: [email or web address] Date:  07/04/2022 Prepared by: KHelane Gunther Exercises - Romberg Stance with Head Nods  - 2 x daily - 7 x weekly - 1 sets - 10 reps - 10-5 sec hold - Corner Balance Feet Apart: Eyes Closed With Head Turns  - 2 x daily - 7 x weekly - 1 sets - 10 reps - 10-5 sec hold - Tandem Stance with Head Rotation  - 2 x daily - 7 x weekly - 1 sets - 10 reps - 10-5 sec hold - Tandem Stance with Head Nods   - 2 x daily - 7 x weekly - 1 sets - 10 reps - 10-5 sec hold - Romberg Stance with Eyes Closed  - 2 x daily - 7 x weekly - 1 sets - 10 reps - 10-5 sec hold - Supine Cervical Retraction with Towel  - 1 x daily - 7 x weekly - 3 sets - 10 reps - 3 sec hold - Standing Cervical Retraction  - 1 x daily - 7 x weekly - 3 sets - 10 reps - 3 sec hold  ASSESSMENT:  CLINICAL IMPRESSION: Lateral trunk lean noted towards left noted during ambulation and occasional mild LOB to left demonstrated during backwards walking; adding unilateral kettlebell carry on left side improved upright postural alignment and stability. Single leg balance challenged with compliant surface (blue mat); moderate postural sway exhibited with posterior trunk lean/mild LOB.   OBJECTIVE IMPAIRMENTS: Abnormal gait, decreased activity tolerance, decreased balance, difficulty walking, decreased ROM, decreased strength, postural dysfunction, and obesity.   ACTIVITY LIMITATIONS: carrying, lifting, stairs, locomotion level, and caring for others  PARTICIPATION LIMITATIONS: community activity and yard work  PERSONAL FACTORS: Past/current experiences and Time since onset of injury/illness/exacerbation are also affecting patient's functional outcome.   REHAB POTENTIAL: Good  CLINICAL DECISION MAKING: Evolving/moderate complexity  EVALUATION COMPLEXITY: Moderate   GOALS: Goals reviewed with patient? Yes  SHORT TERM GOALS: Target date: 07/18/2022  Pt will be ind with initial HEP Baseline: Goal status: INITIAL   LONG TERM GOALS: Target date:  08/15/2022  Pt will be ind with progression and advancement of HEP.  Baseline:  Goal status: INITIAL  2.  Pt will demo improved [employer]  Score to >/=54/56 for low fall risk Baseline:  Goal status: INITIAL  3.  Pt will have improved FGA to >/=26/30 Baseline:  Goal status: INITIAL  4.  Pt will have improved ABC score to >/=90% Baseline:  Goal status: INITIAL    PLAN:  PT FREQUENCY: 2x/week  PT DURATION: 8 weeks  PLANNED INTERVENTIONS: Therapeutic exercises, Therapeutic activity, Neuromuscular re-education, Balance training, Gait training, Patient/Family education, Self Care, Joint mobilization, Vestibular training, Dry Needling, Electrical stimulation, Spinal mobilization, Cryotherapy, Moist heat, Taping, Ionotophoresis 59m/ml Dexamethasone, Manual therapy, and Re-evaluation  PLAN FOR NEXT SESSION: Work on high level balance -- narrow BOS, unstable surfaces, challenge vestibular system, weight shifting, obstacle negotiation.    KHelane Gunther PTA 07/11/2022

## 2022-07-14 ENCOUNTER — Ambulatory Visit: Payer: Medicare Other

## 2022-07-14 ENCOUNTER — Other Ambulatory Visit: Payer: Self-pay | Admitting: Orthopaedic Surgery

## 2022-07-18 ENCOUNTER — Ambulatory Visit: Payer: Medicare Other

## 2022-07-18 DIAGNOSIS — R2681 Unsteadiness on feet: Secondary | ICD-10-CM

## 2022-07-18 DIAGNOSIS — R262 Difficulty in walking, not elsewhere classified: Secondary | ICD-10-CM

## 2022-07-18 DIAGNOSIS — R2689 Other abnormalities of gait and mobility: Secondary | ICD-10-CM | POA: Diagnosis not present

## 2022-07-18 NOTE — Therapy (Signed)
OUTPATIENT PHYSICAL THERAPY LOWER EXTREMITY TREATMENT   Patient Name: JOHNMARK PINTER MRN: [id number] DOB:1950/08/17, 72 y.o., male Today's Date: 07/18/2022  END OF SESSION:  PT End of Session - 07/18/22 1016     Visit Number 8    Date for PT Re-Evaluation 08/15/22    Authorization Type BCBS Medicare    Progress Note Due on Visit 15    PT Start Time 1017    PT Stop Time 1057    PT Time Calculation (min) 40 min    Activity Tolerance Patient tolerated treatment well    Behavior During Therapy [facility] for tasks assessed/performed             Past Medical History:  Diagnosis Date   Arthritis    BBB (bundle branch block) 03/15/2016   Carpal tunnel syndrome    DDD (degenerative disc disease), lumbar 03/15/2016   Depression    Dysrhythmia    had some tachycardia with steroids   Elevated cholesterol 03/15/2016   Headache(784.0)    Ocular Migraines - takes Ambien for it   Heart murmur    HTN (hypertension) 03/15/2016   Hyperlipemia    Hypertension    Malignant melanoma of right side of neck ([place]) 03/15/2016   30 years ago    Neuralgia 03/15/2016   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    RLS (restless legs syndrome)    Sleep apnea    could not use a cpap   Snores    Status post gastric banding    Wears glasses    driving   Past Surgical History:  Procedure Laterality Date   ANTERIOR CERVICAL DECOMPRESSION/DISCECTOMY FUSION 4 LEVELS N/A 02/26/2020   Procedure: ANTERIOR CERVICAL DECOMPRESSION/DISCECTOMY FUSION, INTERBODY PROSTHESIS, PLATE/SCREWS CERVICAL THREE-FOUR CERVICAL FOUR-FIVE CERVICAL FIVE-SIX- CERVICAL SIX-SEVEN;  Surgeon: Newman Pies, MD;  Location: [place];  Service: Neurosurgery;  Laterality: N/A;   CARPAL TUNNEL RELEASE Right 09/26/2012   Procedure: CARPAL TUNNEL RELEASE;  Surgeon: Tennis Must, MD;  Location: [place];  Service: Orthopedics;  Laterality: Right;    CARPAL TUNNEL RELEASE Left 10/24/2012   Procedure: CARPAL TUNNEL RELEASE;  Surgeon: Tennis Must, MD;  Location: [place];  Service: Orthopedics;  Laterality: Left;   EXCISION MELANOMA WITH SENTINEL LYMPH NODE BIOPSY  1984   rt neck with mulpipal nodes and some muscle excised rt neck-shoulder   KNEE ARTHROPLASTY     knee replacement     LAPAROSCOPIC GASTRIC BANDING  03/15/2009   LAPAROSCOPIC GASTRIC BANDING     MUSCLE BIOPSY  2004   rt thigh to r/o myocitis   nerve conduction velosity test     SHOULDER SURGERY Right 12/2021   TONSILLECTOMY     TOTAL KNEE ARTHROPLASTY Bilateral 12/05/2010   TRIGGER FINGER RELEASE Right 09/26/2012   Procedure: RELEASE TRIGGER FINGER/A-1 PULLEY RING AND SMALL ;  Surgeon: Tennis Must, MD;  Location: [place];  Service: Orthopedics;  Laterality: Right;   TRIGGER FINGER RELEASE Left 10/24/2012   Procedure: RELEASE TRIGGER FINGER/A-1 PULLEY LEFT MIDDLE AND LEFT RING;  Surgeon: Tennis Must, MD;  Location: [place];  Service: Orthopedics;  Laterality: Left;   UPPER GI ENDOSCOPY     Patient Active Problem List   Diagnosis Date Noted   Cervical spondylosis with radiculopathy 02/26/2020   SBO (small bowel obstruction) ([place]) 08/21/2017   Trochanteric bursitis of left hip  01/18/2017   Lateral epicondylitis, right elbow 01/18/2017   Rheumatoid arthritis ([place]) 03/15/2016   DJD (degenerative joint disease), cervical 03/15/2016   DDD (degenerative disc disease), lumbar 03/15/2016   HTN (hypertension) 03/15/2016   Obesity 03/15/2016   Elevated cholesterol 03/15/2016   Neuralgia 03/15/2016   Malignant melanoma of right side of neck ([place]) 03/15/2016   BBB (bundle branch block) 03/15/2016   High risk medication use 03/15/2016   Osteoarthritis of lumbar spine 03/15/2016   Primary osteoarthritis of both hands 03/15/2016   Primary osteoarthritis of both knees 03/15/2016   Mesenteric adenitis 06/29/2015    PCP:  Orpah Melter, MD  REFERRING PROVIDER: Stamey, Girtha Rm, FNP  REFERRING DIAG: R26.9 (ICD-10-CM) - Unspecified abnormalities of gait and mobility  THERAPY DIAG:  Unsteadiness on feet  Difficulty in walking, not elsewhere classified  Other abnormalities of gait and mobility  Rationale for Evaluation and Treatment: Rehabilitation  ONSET DATE: Within the last year  SUBJECTIVE:   SUBJECTIVE STATEMENT:  Patient reports he is feeling better after last Friday (back pain flare up). Patient reports no change in R eye. Patient is compliant with HEP, states he has been focusing on single leg balance.    PERTINENT HISTORY: Bilat knee replacements, R shoulder replacement, ACDF (4 levels)  PAIN:  Are you having pain? Yes: NPRS scale: 0 currently, 3 at worst/10 Pain location: R shoulder Pain description: dull/achy Aggravating factors: Reaching Relieving factors: rest/ice  PRECAUTIONS: Fall  WEIGHT BEARING RESTRICTIONS: No  FALLS:  Has patient fallen in last 6 months? Yes. Number of falls 2  LIVING ENVIRONMENT: Lives with: lives with their spouse, daughter and grandchildren are 3 houses down Lives in: House/apartment Stairs: Yes: External: 3 steps; in garage with railing on L Has following equipment at home: None  OCCUPATION: Retired  PLOF: Independent  PATIENT GOALS: Improve balance  NEXT MD VISIT:   OBJECTIVE:   DIAGNOSTIC FINDINGS: none  PATIENT SURVEYS:  ABC scale: 88.43%  COGNITION: Overall cognitive status: Within functional limits for tasks assessed     SENSATION: WFL  MUSCLE LENGTH: Hamstrings: did not assess Thomas test: did not assess  POSTURE: No Significant postural limitations  PALPATION: Nothing relevant to referral for gait  LOWER EXTREMITY ROM: WFL  LOWER EXTREMITY MMT:  MMT Right eval Left eval  Hip flexion 4 4  Hip extension    Hip abduction 4+ sitting 4+ sitting  Hip adduction 4+ sitting 4+ sitting  Hip internal rotation     Hip external rotation    Knee flexion 5 5  Knee extension 5 5  Ankle dorsiflexion    Ankle plantarflexion 5 5  Ankle inversion    Ankle eversion     (Blank rows = not tested)    FUNCTIONAL TESTS:  5 times sit to stand: 8.74 sec Berg Balance Scale: 49/56 Functional gait assessment: 22/30 mCTSIB: condition 1: 30 sec, condition 2: 30 sec, condition 3: 30 sec, condition 4: 30 sec but with increased sway/unsteadiness    [facility] PT Assessment - 07/18/22 0001       Functional Gait  Assessment   Gait Level Surface Walks 20 ft in less than 5.5 sec, no assistive devices, good speed, no evidence for imbalance, normal gait pattern, deviates no more than 6 in outside of the 12 in walkway width.    Change in Gait Speed Able to smoothly change walking speed without loss of balance or gait deviation. Deviate no more than 6 in outside of the 12 in walkway width.  Gait with Horizontal Head Turns Performs head turns smoothly with slight change in gait velocity (eg, minor disruption to smooth gait path), deviates 6-10 in outside 12 in walkway width, or uses an assistive device.    Gait with Vertical Head Turns Performs task with slight change in gait velocity (eg, minor disruption to smooth gait path), deviates 6 - 10 in outside 12 in walkway width or uses assistive device    Gait and Pivot Turn Pivot turns safely within 3 sec and stops quickly with no loss of balance.    Step Over Obstacle Is able to step over 2 stacked shoe boxes taped together (9 in total height) without changing gait speed. No evidence of imbalance.    Gait with Narrow Base of Support Is able to ambulate for 10 steps heel to toe with no staggering.    Gait with Eyes Closed Walks 20 ft, no assistive devices, good speed, no evidence of imbalance, normal gait pattern, deviates no more than 6 in outside 12 in walkway width. Ambulates 20 ft in less than 7 sec.    Ambulating Backwards Walks 20 ft, no assistive devices, good speed, no  evidence for imbalance, normal gait    Steps Alternating feet, no rail.    Total Score 28            GAIT: Distance walked: To back of clinic Assistive device utilized: None Level of assistance: Complete Independence Comments: [facility]   [facility] Adult PT Treatment:                                                DATE: 07/18/2022 Therapeutic Activity: Fwd amb yaw head turns every 3 steps x160'  Bkwd amb --> added yaw head turns Tandem amb w/long fixation 4L  FGA Bkwd tandem 4L Airex:  pTandem static balance EO x30" B --> added slow Y/P head turns 2x10" each B pTandem static balance EC 2x30" Rocker board: fwd/bkwd rocking    [facility] Adult PT Treatment:                                                DATE: 07/11/2022 Neuromuscular re-ed: Fwd amb yaw head turns every 3 steps x160' --> added 15#KB suitcase carry Backward walking --> added 15#KB suitcase carry --> Fwd/bkwd amb no weight Tandem amb w/long fixation 2L --> added yaw head turns every 3 steps Blue Mat: Clock Yourself: simple colors - 70 SPM x1 min, 80 SPM x 46mn, 90 SPM x1 min Modified SLB x30" each --> SLB 3x30" B (fingertip touch) SLB on even ground x30" B Fwd tandem amb w/long fixation   PATIENT EDUCATION:  Education details: Progressing HEP Person educated: Patient Education method: Explanation, Demonstration, and Handouts Education comprehension: verbalized understanding, returned demonstration, and needs further education  HOME EXERCISE PROGRAM: Access Code: [email or web address]: [email or web address] Date: 07/04/2022 Prepared by: KHelane Gunther Exercises - Romberg Stance with Head Nods  - 2 x daily - 7 x weekly - 1 sets - 10 reps - 10-5 sec hold - Corner Balance Feet Apart: Eyes Closed With Head Turns  - 2 x daily - 7 x weekly - 1 sets - 10 reps - 10-5 sec hold - Tandem Stance with Head Rotation  -  2 x daily - 7 x weekly - 1 sets - 10 reps - 10-5 sec hold - Tandem Stance with Head Nods   - 2 x daily - 7 x  weekly - 1 sets - 10 reps - 10-5 sec hold - Romberg Stance with Eyes Closed  - 2 x daily - 7 x weekly - 1 sets - 10 reps - 10-5 sec hold - Supine Cervical Retraction with Towel  - 1 x daily - 7 x weekly - 3 sets - 10 reps - 3 sec hold - Standing Cervical Retraction  - 1 x daily - 7 x weekly - 3 sets - 10 reps - 3 sec hold  ASSESSMENT:  CLINICAL IMPRESSION: Postural stability challenged with backwards tandem walking; patient demonstrated tendency towards posterior trunk lean and utilized occasional stepping strategy to regain balance. FGA score improved by 6 points as compared to evaluation. Patient continues to demonstrate mild balance deficits with added head turns in yaw and pitch planes during ambulation. Moderate postural sway exhibited during progression to eyes closed NBOS balance on compliant surface. Patient will continue to benefit from skilled therapy to address balance deficits with head turns and NBOS and improve overall stability and strength to safely navigate community and home.   OBJECTIVE IMPAIRMENTS: Abnormal gait, decreased activity tolerance, decreased balance, difficulty walking, decreased ROM, decreased strength, postural dysfunction, and obesity.   ACTIVITY LIMITATIONS: carrying, lifting, stairs, locomotion level, and caring for others  PARTICIPATION LIMITATIONS: community activity and yard work  PERSONAL FACTORS: Past/current experiences and Time since onset of injury/illness/exacerbation are also affecting patient's functional outcome.   REHAB POTENTIAL: Good  CLINICAL DECISION MAKING: Evolving/moderate complexity  EVALUATION COMPLEXITY: Moderate   GOALS: Goals reviewed with patient? Yes  SHORT TERM GOALS: Target date: 07/18/2022  Pt will be ind with initial HEP Baseline: Goal status: MET    LONG TERM GOALS: Target date: 08/15/2022  Pt will be ind with progression and advancement of HEP.  Baseline:  Goal status: INITIAL  2.  Pt will demo improved Berg  Balance Score to >/=54/56 for low fall risk Baseline:  Goal status: INITIAL  3.  Pt will have improved FGA to >/=26/30 Baseline:  Goal status: MET 28/30 on 07/18/22  4.  Pt will have improved ABC score to >/=90% Baseline:  Goal status: INITIAL    PLAN:  PT FREQUENCY: 2x/week  PT DURATION: 8 weeks  PLANNED INTERVENTIONS: Therapeutic exercises, Therapeutic activity, Neuromuscular re-education, Balance training, Gait training, Patient/Family education, Self Care, Joint mobilization, Vestibular training, Dry Needling, Electrical stimulation, Spinal mobilization, Cryotherapy, Moist heat, Taping, Ionotophoresis '4mg'$ /ml Dexamethasone, Manual therapy, and Re-evaluation  PLAN FOR NEXT SESSION: Work on high level balance -- narrow BOS, unstable surfaces, challenge vestibular system, weight shifting, obstacle negotiation.    Helane Gunther, PTA 07/18/2022

## 2022-07-20 ENCOUNTER — Encounter: Payer: Self-pay | Admitting: Physician Assistant

## 2022-07-20 ENCOUNTER — Ambulatory Visit: Payer: Medicare Other | Attending: Physician Assistant | Admitting: Physician Assistant

## 2022-07-20 VITALS — BP 118/78 | HR 73 | Resp 14 | Ht 68.0 in | Wt 259.0 lb

## 2022-07-20 DIAGNOSIS — M19041 Primary osteoarthritis, right hand: Secondary | ICD-10-CM | POA: Diagnosis not present

## 2022-07-20 DIAGNOSIS — M19011 Primary osteoarthritis, right shoulder: Secondary | ICD-10-CM

## 2022-07-20 DIAGNOSIS — M5030 Other cervical disc degeneration, unspecified cervical region: Secondary | ICD-10-CM

## 2022-07-20 DIAGNOSIS — Z96653 Presence of artificial knee joint, bilateral: Secondary | ICD-10-CM

## 2022-07-20 DIAGNOSIS — M0609 Rheumatoid arthritis without rheumatoid factor, multiple sites: Secondary | ICD-10-CM

## 2022-07-20 DIAGNOSIS — M5136 Other intervertebral disc degeneration, lumbar region: Secondary | ICD-10-CM

## 2022-07-20 DIAGNOSIS — Z85820 Personal history of malignant melanoma of skin: Secondary | ICD-10-CM

## 2022-07-20 DIAGNOSIS — M19042 Primary osteoarthritis, left hand: Secondary | ICD-10-CM

## 2022-07-20 DIAGNOSIS — M19012 Primary osteoarthritis, left shoulder: Secondary | ICD-10-CM

## 2022-07-20 DIAGNOSIS — Z79899 Other long term (current) drug therapy: Secondary | ICD-10-CM

## 2022-07-20 MED ORDER — HYDROXYCHLOROQUINE SULFATE 200 MG PO TABS: 200.0000 mg | ORAL_TABLET | Freq: Two times a day (BID) | ORAL | 0 refills | Status: DC

## 2022-07-20 MED ORDER — METHOTREXATE SODIUM CHEMO INJECTION 50 MG/2ML: INTRAMUSCULAR | 0 refills | Status: DC

## 2022-07-20 NOTE — Patient Instructions (Addendum)
Standing Labs We placed an order today for your standing lab work.   Please have your standing labs drawn in June and every 3 months   Please have your labs drawn 2 weeks prior to your appointment so that the provider can discuss your lab results at your appointment, if possible.  Please note that you may see your imaging and lab results in [place] before we have reviewed them. We will contact you once all results are reviewed. Please allow our office up to 72 hours to thoroughly review all of the results before contacting the office for clarification of your results.  WALK-IN LAB HOURS  Monday through Thursday from 8:00 am -12:30 pm and 1:00 pm-5:00 pm and Friday from 8:00 am-12:00 pm.  Patients with office visits requiring labs will be seen before walk-in labs.  You may encounter longer than normal wait times. Please allow additional time. Wait times may be shorter on  Monday and Thursday afternoons.  We do not book appointments for walk-in labs. We appreciate your patience and understanding with our staff.   Labs are drawn by [facility]. Please bring your co-pay at the time of your lab draw.  You may receive a bill from [facility] for your lab work.  Please note if you are on Hydroxychloroquine and and an order has been placed for a Hydroxychloroquine level,  you will need to have it drawn 4 hours or more after your last dose.  If you wish to have your labs drawn at another location, please call the office 24 hours in advance so we can fax the orders.  The office is located at [address] [place], [place], [place] [address]   If you have any questions regarding directions or hours of operation,  please call [phone].   As a reminder, please drink plenty of water prior to coming for your lab work. Thanks!   If you have signs or symptoms of an infection or start antibiotics: First, call your PCP for workup of your infection. Hold your medication through the infection, until you  complete your antibiotics, and until symptoms resolve if you take the following: Injectable medication (Actemra, Benlysta, Cimzia, Cosentyx, Enbrel, Humira, Kevzara, Orencia, Remicade, Simponi, Stelara, Taltz, Tremfya) Methotrexate Leflunomide (Arava) Mycophenolate (Cellcept) Morrie Sheldon, Olumiant, or Rinvoq Vaccines You are taking a medication(s) that can suppress your immune system.  The following immunizations are recommended: Flu annually Covid-19  Td/Tdap (tetanus, diphtheria, pertussis) every 10 years Pneumonia (Prevnar 15 then Pneumovax 23 at least 1 year apart.  Alternatively, can take Prevnar 20 without needing additional dose) Shingrix: 2 doses from 4 weeks to 6 months apart  Please check with your PCP to make sure you are up to date.

## 2022-07-21 ENCOUNTER — Ambulatory Visit: Payer: Medicare Other | Attending: Family Medicine

## 2022-07-21 DIAGNOSIS — R2681 Unsteadiness on feet: Secondary | ICD-10-CM | POA: Diagnosis not present

## 2022-07-21 DIAGNOSIS — R262 Difficulty in walking, not elsewhere classified: Secondary | ICD-10-CM | POA: Insufficient documentation

## 2022-07-21 DIAGNOSIS — R2689 Other abnormalities of gait and mobility: Secondary | ICD-10-CM | POA: Insufficient documentation

## 2022-07-21 LAB — CBC WITH DIFFERENTIAL/PLATELET
Absolute Monocytes: 835 cells/uL (ref 200–950)
Basophils Absolute: 41 cells/uL (ref 0–200)
Basophils Relative: 0.6 %
Eosinophils Absolute: 131 cells/uL (ref 15–500)
Eosinophils Relative: 1.9 %
HCT: 47.3 % (ref 38.5–50.0)
Hemoglobin: 15.6 g/dL (ref 13.2–17.1)
Lymphs Abs: 1642 cells/uL (ref 850–3900)
MCH: 31 pg (ref 27.0–33.0)
MCHC: 33 g/dL (ref 32.0–36.0)
MCV: 94 fL (ref 80.0–100.0)
MPV: 9.7 fL (ref 7.5–12.5)
Monocytes Relative: 12.1 %
Neutro Abs: 4250 cells/uL (ref 1500–7800)
Neutrophils Relative %: 61.6 %
Platelets: 277 10*3/uL (ref 140–400)
RBC: 5.03 10*6/uL (ref 4.20–5.80)
RDW: 14.2 % (ref 11.0–15.0)
Total Lymphocyte: 23.8 %
WBC: 6.9 10*3/uL (ref 3.8–10.8)

## 2022-07-21 LAB — COMPLETE METABOLIC PANEL WITH GFR
AG Ratio: 1.7 (calc) (ref 1.0–2.5)
ALT: 23 U/L (ref 9–46)
AST: 28 U/L (ref 10–35)
Albumin: 4.3 g/dL (ref 3.6–5.1)
Alkaline phosphatase (APISO): 76 U/L (ref 35–144)
BUN: 15 mg/dL (ref 7–25)
CO2: 30 mmol/L (ref 20–32)
Calcium: 10 mg/dL (ref 8.6–10.3)
Chloride: 101 mmol/L (ref 98–110)
Creat: 1.04 mg/dL (ref 0.70–1.28)
Globulin: 2.6 g/dL (calc) (ref 1.9–3.7)
Glucose, Bld: 98 mg/dL (ref 65–99)
Potassium: 4.9 mmol/L (ref 3.5–5.3)
Sodium: 138 mmol/L (ref 135–146)
Total Bilirubin: 0.5 mg/dL (ref 0.2–1.2)
Total Protein: 6.9 g/dL (ref 6.1–8.1)
eGFR: 77 mL/min/{1.73_m2} (ref >=60)

## 2022-07-21 NOTE — Progress Notes (Signed)
CBC and CMP normal

## 2022-07-21 NOTE — Therapy (Signed)
OUTPATIENT PHYSICAL THERAPY LOWER EXTREMITY TREATMENT   Patient Name: David [name] MRN: [id number] DOB:1950-07-23, 72 y.o., male Today's Date: 07/21/2022  END OF SESSION:  PT End of Session - 07/21/22 1011     Visit Number 9    Date for PT Re-Evaluation 08/15/22    Authorization Type BCBS Medicare    Progress Note Due on Visit 15    PT Start Time 1011    PT Stop Time 1057    PT Time Calculation (min) 46 min    Activity Tolerance Patient tolerated treatment well    Behavior During Therapy [facility] for tasks assessed/performed             Past Medical History:  Diagnosis Date   Arthritis    Balance problem    BBB (bundle branch block) 03/15/2016   Carpal tunnel syndrome    DDD (degenerative disc disease), lumbar 03/15/2016   Depression    Dysrhythmia    had some tachycardia with steroids   Elevated cholesterol 03/15/2016   Headache(784.0)    Ocular Migraines - takes Ambien for it   Heart murmur    HTN (hypertension) 03/15/2016   Hyperlipemia    Hypertension    Malignant melanoma of right side of neck ([place]) 03/15/2016   30 years ago    Neuralgia 03/15/2016   Ocular migraine    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    RLS (restless legs syndrome)    Sleep apnea    could not use a cpap   Snores    Status post gastric banding    Wears glasses    driving   Past Surgical History:  Procedure Laterality Date   ANTERIOR CERVICAL DECOMPRESSION/DISCECTOMY FUSION 4 LEVELS N/A 02/26/2020   Procedure: ANTERIOR CERVICAL DECOMPRESSION/DISCECTOMY FUSION, INTERBODY PROSTHESIS, PLATE/SCREWS CERVICAL THREE-FOUR CERVICAL FOUR-FIVE CERVICAL FIVE-SIX- CERVICAL SIX-SEVEN;  Surgeon: Newman Pies, MD;  Location: [place];  Service: Neurosurgery;  Laterality: N/A;   CARPAL TUNNEL RELEASE Right 09/26/2012   Procedure: CARPAL TUNNEL RELEASE;  Surgeon: Tennis Must, MD;  Location: [place];  Service:  Orthopedics;  Laterality: Right;   CARPAL TUNNEL RELEASE Left 10/24/2012   Procedure: CARPAL TUNNEL RELEASE;  Surgeon: Tennis Must, MD;  Location: [place];  Service: Orthopedics;  Laterality: Left;   EXCISION MELANOMA WITH SENTINEL LYMPH NODE BIOPSY  1984   rt neck with mulpipal nodes and some muscle excised rt neck-shoulder   KNEE ARTHROPLASTY     knee replacement     LAPAROSCOPIC GASTRIC BANDING  03/15/2009   LAPAROSCOPIC GASTRIC BANDING     MUSCLE BIOPSY  2004   rt thigh to r/o myocitis   nerve conduction velosity test     SHOULDER SURGERY Right 12/2021   TONSILLECTOMY     TOTAL KNEE ARTHROPLASTY Bilateral 12/05/2010   TRIGGER FINGER RELEASE Right 09/26/2012   Procedure: RELEASE TRIGGER FINGER/A-1 PULLEY RING AND SMALL ;  Surgeon: Tennis Must, MD;  Location: [place];  Service: Orthopedics;  Laterality: Right;   TRIGGER FINGER RELEASE Left 10/24/2012   Procedure: RELEASE TRIGGER FINGER/A-1 PULLEY LEFT MIDDLE AND LEFT RING;  Surgeon: Tennis Must, MD;  Location: [place];  Service: Orthopedics;  Laterality: Left;   UPPER GI ENDOSCOPY     Patient Active Problem List   Diagnosis Date Noted   Cervical spondylosis with radiculopathy 02/26/2020   SBO (small bowel  obstruction) ([place]) 08/21/2017   Trochanteric bursitis of left hip 01/18/2017   Lateral epicondylitis, right elbow 01/18/2017   Rheumatoid arthritis ([place]) 03/15/2016   DJD (degenerative joint disease), cervical 03/15/2016   DDD (degenerative disc disease), lumbar 03/15/2016   HTN (hypertension) 03/15/2016   Obesity 03/15/2016   Elevated cholesterol 03/15/2016   Neuralgia 03/15/2016   Malignant melanoma of right side of neck ([place]) 03/15/2016   BBB (bundle branch block) 03/15/2016   High risk medication use 03/15/2016   Osteoarthritis of lumbar spine 03/15/2016   Primary osteoarthritis of both hands 03/15/2016   Primary osteoarthritis of both knees 03/15/2016    Mesenteric adenitis 06/29/2015    PCP: Stamey, Girtha Rm, FNP  REFERRING PROVIDER: Sheran Spine, FNP  REFERRING DIAG: R26.9 (ICD-10-CM) - Unspecified abnormalities of gait and mobility  THERAPY DIAG:  Unsteadiness on feet  Difficulty in walking, not elsewhere classified  Other abnormalities of gait and mobility  Rationale for Evaluation and Treatment: Rehabilitation  ONSET DATE: Within the last year  SUBJECTIVE:   SUBJECTIVE STATEMENT:  Patient reports yesterday he had a bad episode with room spinning dizziness and migraine with aura, states he feels much better today. Patient states he continues to have "floaters" and blurred vision in R eye.    PERTINENT HISTORY: Bilat knee replacements, R shoulder replacement, ACDF (4 levels)  PAIN:  Are you having pain? Yes: NPRS scale: 0 currently, 3 at worst/10 Pain location: R shoulder Pain description: dull/achy Aggravating factors: Reaching Relieving factors: rest/ice  PRECAUTIONS: Fall  WEIGHT BEARING RESTRICTIONS: No  FALLS:  Has patient fallen in last 6 months? Yes. Number of falls 2  [name] ENVIRONMENT: Lives with: lives with their spouse, daughter and grandchildren are 3 houses down Lives in: House/apartment Stairs: Yes: External: 3 steps; in garage with railing on L Has following equipment at home: None  OCCUPATION: Retired  PLOF: Independent  PATIENT GOALS: Improve balance  NEXT MD VISIT:   OBJECTIVE:   DIAGNOSTIC FINDINGS: none  PATIENT SURVEYS:  ABC scale: 88.43%  COGNITION: Overall cognitive status: Within functional limits for tasks assessed     SENSATION: WFL  MUSCLE LENGTH: Hamstrings: did not assess Thomas test: did not assess  POSTURE: No Significant postural limitations  PALPATION: Nothing relevant to referral for gait  LOWER EXTREMITY ROM: WFL  LOWER EXTREMITY MMT:  MMT Right eval Left eval  Hip flexion 4 4  Hip extension    Hip abduction 4+ sitting 4+ sitting  Hip  adduction 4+ sitting 4+ sitting  Hip internal rotation    Hip external rotation    Knee flexion 5 5  Knee extension 5 5  Ankle dorsiflexion    Ankle plantarflexion 5 5  Ankle inversion    Ankle eversion     (Blank rows = not tested)    FUNCTIONAL TESTS:  5 times sit to stand: 8.74 sec Berg Balance Scale: 49/56 Functional gait assessment: 22/30 mCTSIB: condition 1: 30 sec, condition 2: 30 sec, condition 3: 30 sec, condition 4: 30 sec but with increased sway/unsteadiness     GAIT: Distance walked: To back of clinic Assistive device utilized: None Level of assistance: Complete Independence Comments: [facility]   [facility] Adult PT Treatment:                                                DATE: 07/21/2022 Neuromuscular re-ed:  Dix-Hallpike R&L (-) Supine Roll test R&L (-) Full Track: Fwd amb with yaw head turns 2L VORx1 Y/P 2L each Saccades: Y/P/diagonals Trampoline (bouncing):  mREO head turns Y/P 2x10" --> 4x5"  mREC static standing --> added slow Y/P head turns SLB x30" B mSLS + 2#MB toss on rebounder --> naming cities/vegetables    [facility] Adult PT Treatment:                                                DATE: 07/18/2022 Therapeutic Activity: Fwd amb yaw head turns every 3 steps x160'  Bkwd amb --> added yaw head turns Tandem amb w/long fixation 4L  FGA Bkwd tandem 4L Airex:  pTandem static balance EO x30" B --> added slow Y/P head turns 2x10" each B pTandem static balance EC 2x30" Rocker board: fwd/bkwd rocking   PATIENT EDUCATION:  Education details: Progressing HEP Person educated: Patient Education method: Explanation, Demonstration, and Handouts Education comprehension: verbalized understanding, returned demonstration, and needs further education  HOME EXERCISE PROGRAM: Access Code: [email or web address] URL: [email or web address] Date: 07/04/2022 Prepared by: Helane Gunther  Exercises - Romberg Stance with Head Nods  - 2 x daily - 7 x weekly - 1 sets - 10  reps - 10-5 sec hold - Corner Balance Feet Apart: Eyes Closed With Head Turns  - 2 x daily - 7 x weekly - 1 sets - 10 reps - 10-5 sec hold - Tandem Stance with Head Rotation  - 2 x daily - 7 x weekly - 1 sets - 10 reps - 10-5 sec hold - Tandem Stance with Head Nods   - 2 x daily - 7 x weekly - 1 sets - 10 reps - 10-5 sec hold - Romberg Stance with Eyes Closed  - 2 x daily - 7 x weekly - 1 sets - 10 reps - 10-5 sec hold - Supine Cervical Retraction with Towel  - 1 x daily - 7 x weekly - 3 sets - 10 reps - 3 sec hold - Standing Cervical Retraction  - 1 x daily - 7 x weekly - 3 sets - 10 reps - 3 sec hold  ASSESSMENT:  CLINICAL IMPRESSION: Postural stability and balance progressed with standing perturbations on trampoline with eyes open and closed; moderate postural sway, mainly in anterior/posterior directions. Cognitive component incorporated during dual tasking balance exercises to address memory deficits and challenge reaction strategies.     OBJECTIVE IMPAIRMENTS: Abnormal gait, decreased activity tolerance, decreased balance, difficulty walking, decreased ROM, decreased strength, postural dysfunction, and obesity.   ACTIVITY LIMITATIONS: carrying, lifting, stairs, locomotion level, and caring for others  PARTICIPATION LIMITATIONS: community activity and yard work  PERSONAL FACTORS: Past/current experiences and Time since onset of injury/illness/exacerbation are also affecting patient's functional outcome.   REHAB POTENTIAL: Good  CLINICAL DECISION MAKING: Evolving/moderate complexity  EVALUATION COMPLEXITY: Moderate   GOALS: Goals reviewed with patient? Yes  SHORT TERM GOALS: Target date: 07/18/2022  Pt will be ind with initial HEP Baseline: Goal status: MET    LONG TERM GOALS: Target date: 08/15/2022  Pt will be ind with progression and advancement of HEP.  Baseline:  Goal status: INITIAL  2.  Pt will demo improved Berg Balance Score to >/=54/56 for low fall  risk Baseline:  Goal status: INITIAL  3.  Pt will have improved FGA to >/=26/30 Baseline:  Goal status: MET 28/30  on 07/18/22  4.  Pt will have improved ABC score to >/=90% Baseline:  Goal status: INITIAL    PLAN:  PT FREQUENCY: 2x/week  PT DURATION: 8 weeks  PLANNED INTERVENTIONS: Therapeutic exercises, Therapeutic activity, Neuromuscular re-education, Balance training, Gait training, Patient/Family education, Self Care, Joint mobilization, Vestibular training, Dry Needling, Electrical stimulation, Spinal mobilization, Cryotherapy, Moist heat, Taping, Ionotophoresis '4mg'$ /ml Dexamethasone, Manual therapy, and Re-evaluation  PLAN FOR NEXT SESSION: Work on high level balance -- narrow BOS, unstable surfaces, challenge vestibular system, weight shifting, obstacle negotiation.    Helane Gunther, PTA 07/21/2022

## 2022-07-25 ENCOUNTER — Ambulatory Visit: Payer: Medicare Other

## 2022-07-25 DIAGNOSIS — R262 Difficulty in walking, not elsewhere classified: Secondary | ICD-10-CM | POA: Diagnosis not present

## 2022-07-25 DIAGNOSIS — R2681 Unsteadiness on feet: Secondary | ICD-10-CM

## 2022-07-25 DIAGNOSIS — R2689 Other abnormalities of gait and mobility: Secondary | ICD-10-CM | POA: Diagnosis not present

## 2022-07-25 NOTE — Therapy (Signed)
OUTPATIENT PHYSICAL THERAPY LOWER EXTREMITY TREATMENT   Patient Name: BARUCH BEHRMAN MRN: [id number] DOB:1950/06/23, 72 y.o., male Today's Date: 07/25/2022  END OF SESSION:  PT End of Session - 07/25/22 0800     Visit Number 10    Date for PT Re-Evaluation 08/15/22    Authorization Type BCBS Medicare    Progress Note Due on Visit 15    PT Start Time 0800    PT Stop Time 0845    PT Time Calculation (min) 45 min    Activity Tolerance Patient tolerated treatment well    Behavior During Therapy [facility] for tasks assessed/performed             Past Medical History:  Diagnosis Date   Arthritis    Balance problem    BBB (bundle branch block) 03/15/2016   Carpal tunnel syndrome    DDD (degenerative disc disease), lumbar 03/15/2016   Depression    Dysrhythmia    had some tachycardia with steroids   Elevated cholesterol 03/15/2016   Headache(784.0)    Ocular Migraines - takes Ambien for it   Heart murmur    HTN (hypertension) 03/15/2016   Hyperlipemia    Hypertension    Malignant melanoma of right side of neck ([place]) 03/15/2016   30 years ago    Neuralgia 03/15/2016   Ocular migraine    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    RLS (restless legs syndrome)    Sleep apnea    could not use a cpap   Snores    Status post gastric banding    Wears glasses    driving   Past Surgical History:  Procedure Laterality Date   ANTERIOR CERVICAL DECOMPRESSION/DISCECTOMY FUSION 4 LEVELS N/A 02/26/2020   Procedure: ANTERIOR CERVICAL DECOMPRESSION/DISCECTOMY FUSION, INTERBODY PROSTHESIS, PLATE/SCREWS CERVICAL THREE-FOUR CERVICAL FOUR-FIVE CERVICAL FIVE-SIX- CERVICAL SIX-SEVEN;  Surgeon: Newman Pies, MD;  Location: [place];  Service: Neurosurgery;  Laterality: N/A;   CARPAL TUNNEL RELEASE Right 09/26/2012   Procedure: CARPAL TUNNEL RELEASE;  Surgeon: Tennis Must, MD;  Location: [place];  Service:  Orthopedics;  Laterality: Right;   CARPAL TUNNEL RELEASE Left 10/24/2012   Procedure: CARPAL TUNNEL RELEASE;  Surgeon: Tennis Must, MD;  Location: [place];  Service: Orthopedics;  Laterality: Left;   EXCISION MELANOMA WITH SENTINEL LYMPH NODE BIOPSY  1984   rt neck with mulpipal nodes and some muscle excised rt neck-shoulder   KNEE ARTHROPLASTY     knee replacement     LAPAROSCOPIC GASTRIC BANDING  03/15/2009   LAPAROSCOPIC GASTRIC BANDING     MUSCLE BIOPSY  2004   rt thigh to r/o myocitis   nerve conduction velosity test     SHOULDER SURGERY Right 12/2021   TONSILLECTOMY     TOTAL KNEE ARTHROPLASTY Bilateral 12/05/2010   TRIGGER FINGER RELEASE Right 09/26/2012   Procedure: RELEASE TRIGGER FINGER/A-1 PULLEY RING AND SMALL ;  Surgeon: Tennis Must, MD;  Location: [place];  Service: Orthopedics;  Laterality: Right;   TRIGGER FINGER RELEASE Left 10/24/2012   Procedure: RELEASE TRIGGER FINGER/A-1 PULLEY LEFT MIDDLE AND LEFT RING;  Surgeon: Tennis Must, MD;  Location: [place];  Service: Orthopedics;  Laterality: Left;   UPPER GI ENDOSCOPY     Patient Active Problem List   Diagnosis Date Noted   Cervical spondylosis with radiculopathy 02/26/2020   SBO (small bowel  obstruction) ([place]) 08/21/2017   Trochanteric bursitis of left hip 01/18/2017   Lateral epicondylitis, right elbow 01/18/2017   Rheumatoid arthritis ([place]) 03/15/2016   DJD (degenerative joint disease), cervical 03/15/2016   DDD (degenerative disc disease), lumbar 03/15/2016   HTN (hypertension) 03/15/2016   Obesity 03/15/2016   Elevated cholesterol 03/15/2016   Neuralgia 03/15/2016   Malignant melanoma of right side of neck ([place]) 03/15/2016   BBB (bundle branch block) 03/15/2016   High risk medication use 03/15/2016   Osteoarthritis of lumbar spine 03/15/2016   Primary osteoarthritis of both hands 03/15/2016   Primary osteoarthritis of both knees 03/15/2016    Mesenteric adenitis 06/29/2015    PCP: Stamey, Girtha Rm, FNP  REFERRING PROVIDER: Sheran Spine, FNP  REFERRING DIAG: R26.9 (ICD-10-CM) - Unspecified abnormalities of gait and mobility  THERAPY DIAG:  Unsteadiness on feet  Difficulty in walking, not elsewhere classified  Other abnormalities of gait and mobility  Rationale for Evaluation and Treatment: Rehabilitation  ONSET DATE: Within the last year  SUBJECTIVE:   SUBJECTIVE STATEMENT:  Patient reports he has had any episodes of ocular migraines or room spinning dizziness.    PERTINENT HISTORY: Bilat knee replacements, R shoulder replacement, ACDF (4 levels)  PAIN:  Are you having pain? Yes: NPRS scale: 0 currently, 3 at worst/10 Pain location: R shoulder Pain description: dull/achy Aggravating factors: Reaching Relieving factors: rest/ice  PRECAUTIONS: Fall  WEIGHT BEARING RESTRICTIONS: No  FALLS:  Has patient fallen in last 6 months? Yes. Number of falls 2  LIVING ENVIRONMENT: Lives with: lives with their spouse, daughter and grandchildren are 3 houses down Lives in: House/apartment Stairs: Yes: External: 3 steps; in garage with railing on L Has following equipment at home: None  OCCUPATION: Retired  PLOF: Independent  PATIENT GOALS: Improve balance  NEXT MD VISIT:   OBJECTIVE:   DIAGNOSTIC FINDINGS: none  PATIENT SURVEYS:  ABC scale: 88.43%  COGNITION: Overall cognitive status: Within functional limits for tasks assessed     SENSATION: WFL  MUSCLE LENGTH: Hamstrings: did not assess Thomas test: did not assess  POSTURE: No Significant postural limitations  PALPATION: Nothing relevant to referral for gait  LOWER EXTREMITY ROM: WFL  LOWER EXTREMITY MMT:  MMT Right eval Left eval  Hip flexion 4 4  Hip extension    Hip abduction 4+ sitting 4+ sitting  Hip adduction 4+ sitting 4+ sitting  Hip internal rotation    Hip external rotation    Knee flexion 5 5  Knee extension 5 5   Ankle dorsiflexion    Ankle plantarflexion 5 5  Ankle inversion    Ankle eversion     (Blank rows = not tested)    FUNCTIONAL TESTS:  5 times sit to stand: 8.74 sec Berg Balance Scale: 49/56 Functional gait assessment: 22/30 mCTSIB: condition 1: 30 sec, condition 2: 30 sec, condition 3: 30 sec, condition 4: 30 sec but with increased sway/unsteadiness    [facility] PT Assessment - 07/25/22 0001       Berg Balance Test   Sit to Stand Able to stand without using hands and stabilize independently    Standing Unsupported Able to stand safely 2 minutes    Sitting with Back Unsupported but Feet Supported on Floor or Stool Able to sit safely and securely 2 minutes    Stand to Sit Sits safely with minimal use of hands    Transfers Able to transfer safely, minor use of hands    Standing Unsupported with Eyes Closed Able to  stand 10 seconds safely    Standing Unsupported with Feet Together Able to place feet together independently and stand 1 minute safely    From Standing, Reach Forward with Outstretched Arm Can reach forward >12 cm safely (5")    From Standing Position, Pick up Object from [place] to pick up shoe safely and easily    From Standing Position, Turn to Look Behind Over each Shoulder Looks behind from both sides and weight shifts well    Turn 360 Degrees Able to turn 360 degrees safely in 4 seconds or less    Standing Unsupported, Alternately Place Feet on Step/Stool Able to stand independently and safely and complete 8 steps in 20 seconds    Standing Unsupported, One Foot in [place] to take small step independently and hold 30 seconds    Standing on One Leg Able to lift leg independently and hold 5-10 seconds    Total Score 52             GAIT: Distance walked: To back of clinic Assistive device utilized: None Level of assistance: Complete Independence Comments: [facility]   [facility] Adult PT Treatment:                                                DATE:  07/25/2022 Therapeutic Activity: Fwd march w/opposite knee taps 1L (full track) Lateral march w/opposite knee taps 2L Fwd & Bkwd amb + Y/P head turns 2L each Fwd/Bkwd amb EO/EC every 3 steps --> EC 2L each SLS + 2#MB toss on rebounder B Heel raises --> single leg raises --> isometric single leg raises Gastroc stretch on slantboard + ABC intake Berg Balance test   [facility] Adult PT Treatment:                                                DATE: 07/21/2022 Neuromuscular re-ed: Dix-Hallpike R&L (-) Supine Roll test R&L (-) Full Track: Fwd amb with yaw head turns 2L VORx1 Y/P 2L each Saccades: Y/P/diagonals Trampoline (bouncing):  mREO head turns Y/P 2x10" --> 4x5"  mREC static standing --> added slow Y/P head turns SLB x30" B mSLS + 2#MB toss on rebounder --> naming cities/vegetables   PATIENT EDUCATION:  Education details: Progressing HEP Person educated: Patient Education method: Explanation, Demonstration, and Handouts Education comprehension: verbalized understanding, returned demonstration, and needs further education  HOME EXERCISE PROGRAM: Access Code: [email or web address] URL: [email or web address] Date: 07/04/2022 Prepared by: Helane Gunther  Exercises - Romberg Stance with Head Nods  - 2 x daily - 7 x weekly - 1 sets - 10 reps - 10-5 sec hold - Corner Balance Feet Apart: Eyes Closed With Head Turns  - 2 x daily - 7 x weekly - 1 sets - 10 reps - 10-5 sec hold - Tandem Stance with Head Rotation  - 2 x daily - 7 x weekly - 1 sets - 10 reps - 10-5 sec hold - Tandem Stance with Head Nods   - 2 x daily - 7 x weekly - 1 sets - 10 reps - 10-5 sec hold - Romberg Stance with Eyes Closed  - 2 x daily - 7 x weekly - 1 sets - 10 reps - 10-5 sec hold -  Supine Cervical Retraction with Towel  - 1 x daily - 7 x weekly - 3 sets - 10 reps - 3 sec hold - Standing Cervical Retraction  - 1 x daily - 7 x weekly - 3 sets - 10 reps - 3 sec hold  ASSESSMENT:  CLINICAL IMPRESSION:  Single leg  heel raises performed to progress postural stability and LE strength; moderate weakness noted on R side. Isometric heel raises performed with hands against wall to decrease forward lean compensation. Berg balance score improved; patient continues to have mild-moderate difficulty with tandem stance balance and single leg balance.    OBJECTIVE IMPAIRMENTS: Abnormal gait, decreased activity tolerance, decreased balance, difficulty walking, decreased ROM, decreased strength, postural dysfunction, and obesity.   ACTIVITY LIMITATIONS: carrying, lifting, stairs, locomotion level, and caring for others  PARTICIPATION LIMITATIONS: community activity and yard work  PERSONAL FACTORS: Past/current experiences and Time since onset of injury/illness/exacerbation are also affecting patient's functional outcome.   REHAB POTENTIAL: Good  CLINICAL DECISION MAKING: Evolving/moderate complexity  EVALUATION COMPLEXITY: Moderate   GOALS: Goals reviewed with patient? Yes  SHORT TERM GOALS: Target date: 07/18/2022  Pt will be ind with initial HEP Baseline: Goal status: MET    LONG TERM GOALS: Target date: 08/15/2022  Pt will be ind with progression and advancement of HEP.  Baseline:  Goal status: INITIAL  2.  Pt will demo improved Berg Balance Score to >/=54/56 for low fall risk Baseline:  Goal status: IN PROGRESS 52/56 on 07/25/22   3.  Pt will have improved FGA to >/=26/30 Baseline:  Goal status: MET 28/30 on 07/18/22  4.  Pt will have improved ABC score to >/=90% Baseline:  Goal status: IN PROGRESS 86.3% on 07/25/22    PLAN:  PT FREQUENCY: 2x/week  PT DURATION: 8 weeks  PLANNED INTERVENTIONS: Therapeutic exercises, Therapeutic activity, Neuromuscular re-education, Balance training, Gait training, Patient/Family education, Self Care, Joint mobilization, Vestibular training, Dry Needling, Electrical stimulation, Spinal mobilization, Cryotherapy, Moist heat, Taping, Ionotophoresis '4mg'$ /ml  Dexamethasone, Manual therapy, and Re-evaluation  PLAN FOR NEXT SESSION: Work on high level balance -- narrow BOS, unstable surfaces, challenge vestibular system, weight shifting, obstacle negotiation.    Helane Gunther, PTA 07/25/2022

## 2022-07-27 ENCOUNTER — Encounter: Payer: Self-pay | Admitting: Radiology

## 2022-07-28 ENCOUNTER — Ambulatory Visit: Payer: Medicare Other

## 2022-07-28 DIAGNOSIS — R2689 Other abnormalities of gait and mobility: Secondary | ICD-10-CM

## 2022-07-28 DIAGNOSIS — R2681 Unsteadiness on feet: Secondary | ICD-10-CM

## 2022-07-28 DIAGNOSIS — R262 Difficulty in walking, not elsewhere classified: Secondary | ICD-10-CM | POA: Diagnosis not present

## 2022-07-28 NOTE — Therapy (Signed)
OUTPATIENT PHYSICAL THERAPY LOWER EXTREMITY TREATMENT   Patient Name: David [name] MRN: [id number] DOB:02-08-1951, 72 y.o., male Today's Date: 07/28/2022  END OF SESSION:  PT End of Session - 07/28/22 0848     Visit Number 11    Date for PT Re-Evaluation 08/15/22    Authorization Type BCBS Medicare    Progress Note Due on Visit 15    PT Start Time 0848    PT Stop Time 0927    PT Time Calculation (min) 39 min    Activity Tolerance Patient tolerated treatment well    Behavior During Therapy [facility] for tasks assessed/performed             Past Medical History:  Diagnosis Date   Arthritis    Balance problem    BBB (bundle branch block) 03/15/2016   Carpal tunnel syndrome    DDD (degenerative disc disease), lumbar 03/15/2016   Depression    Dysrhythmia    had some tachycardia with steroids   Elevated cholesterol 03/15/2016   Headache(784.0)    Ocular Migraines - takes Ambien for it   Heart murmur    HTN (hypertension) 03/15/2016   Hyperlipemia    Hypertension    Malignant melanoma of right side of neck ([place]) 03/15/2016   30 years ago    Neuralgia 03/15/2016   Ocular migraine    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    RLS (restless legs syndrome)    Sleep apnea    could not use a cpap   Snores    Status post gastric banding    Wears glasses    driving   Past Surgical History:  Procedure Laterality Date   ANTERIOR CERVICAL DECOMPRESSION/DISCECTOMY FUSION 4 LEVELS N/A 02/26/2020   Procedure: ANTERIOR CERVICAL DECOMPRESSION/DISCECTOMY FUSION, INTERBODY PROSTHESIS, PLATE/SCREWS CERVICAL THREE-FOUR CERVICAL FOUR-FIVE CERVICAL FIVE-SIX- CERVICAL SIX-SEVEN;  Surgeon: Newman Pies, MD;  Location: [place];  Service: Neurosurgery;  Laterality: N/A;   CARPAL TUNNEL RELEASE Right 09/26/2012   Procedure: CARPAL TUNNEL RELEASE;  Surgeon: Tennis Must, MD;  Location: [place];  Service:  Orthopedics;  Laterality: Right;   CARPAL TUNNEL RELEASE Left 10/24/2012   Procedure: CARPAL TUNNEL RELEASE;  Surgeon: Tennis Must, MD;  Location: [place];  Service: Orthopedics;  Laterality: Left;   EXCISION MELANOMA WITH SENTINEL LYMPH NODE BIOPSY  1984   rt neck with mulpipal nodes and some muscle excised rt neck-shoulder   KNEE ARTHROPLASTY     knee replacement     LAPAROSCOPIC GASTRIC BANDING  03/15/2009   LAPAROSCOPIC GASTRIC BANDING     MUSCLE BIOPSY  2004   rt thigh to r/o myocitis   nerve conduction velosity test     SHOULDER SURGERY Right 12/2021   TONSILLECTOMY     TOTAL KNEE ARTHROPLASTY Bilateral 12/05/2010   TRIGGER FINGER RELEASE Right 09/26/2012   Procedure: RELEASE TRIGGER FINGER/A-1 PULLEY RING AND SMALL ;  Surgeon: Tennis Must, MD;  Location: [place];  Service: Orthopedics;  Laterality: Right;   TRIGGER FINGER RELEASE Left 10/24/2012   Procedure: RELEASE TRIGGER FINGER/A-1 PULLEY LEFT MIDDLE AND LEFT RING;  Surgeon: Tennis Must, MD;  Location: [place];  Service: Orthopedics;  Laterality: Left;   UPPER GI ENDOSCOPY     Patient Active Problem List   Diagnosis Date Noted   Cervical spondylosis with radiculopathy 02/26/2020   SBO (small bowel  obstruction) ([place]) 08/21/2017   Trochanteric bursitis of left hip 01/18/2017   Lateral epicondylitis, right elbow 01/18/2017   Rheumatoid arthritis ([place]) 03/15/2016   DJD (degenerative joint disease), cervical 03/15/2016   DDD (degenerative disc disease), lumbar 03/15/2016   HTN (hypertension) 03/15/2016   Obesity 03/15/2016   Elevated cholesterol 03/15/2016   Neuralgia 03/15/2016   Malignant melanoma of right side of neck ([place]) 03/15/2016   BBB (bundle branch block) 03/15/2016   High risk medication use 03/15/2016   Osteoarthritis of lumbar spine 03/15/2016   Primary osteoarthritis of both hands 03/15/2016   Primary osteoarthritis of both knees 03/15/2016    Mesenteric adenitis 06/29/2015    PCP: Stamey, Girtha Rm, FNP  REFERRING PROVIDER: Sheran Spine, FNP  REFERRING DIAG: R26.9 (ICD-10-CM) - Unspecified abnormalities of gait and mobility  THERAPY DIAG:  Unsteadiness on feet  Difficulty in walking, not elsewhere classified  Other abnormalities of gait and mobility  Rationale for Evaluation and Treatment: Rehabilitation  ONSET DATE: Within the last year  SUBJECTIVE:   SUBJECTIVE STATEMENT:  Patient reports his balance is feeling more steady, states he is working on HEP at home and feels his single leg balance is improving. Patient reports no falls or major LOB since start of therapy.   PERTINENT HISTORY: Bilat knee replacements, R shoulder replacement, ACDF (4 levels)  PAIN:  Are you having pain? Yes: NPRS scale: 0 currently, 3 at worst/10 Pain location: R shoulder Pain description: dull/achy Aggravating factors: Reaching Relieving factors: rest/ice  PRECAUTIONS: Fall  WEIGHT BEARING RESTRICTIONS: No  FALLS:  Has patient fallen in last 6 months? Yes. Number of falls 2  LIVING ENVIRONMENT: Lives with: lives with their spouse, daughter and grandchildren are 3 houses down Lives in: House/apartment Stairs: Yes: External: 3 steps; in garage with railing on L Has following equipment at home: None  OCCUPATION: Retired  PLOF: Independent  PATIENT GOALS: Improve balance  NEXT MD VISIT:   OBJECTIVE:   DIAGNOSTIC FINDINGS: none  PATIENT SURVEYS:  ABC scale: 88.43%  COGNITION: Overall cognitive status: Within functional limits for tasks assessed     SENSATION: WFL  MUSCLE LENGTH: Hamstrings: did not assess Thomas test: did not assess  POSTURE: No Significant postural limitations  PALPATION: Nothing relevant to referral for gait  LOWER EXTREMITY ROM: WFL  LOWER EXTREMITY MMT:  MMT Right eval Left eval  Hip flexion 4 4  Hip extension    Hip abduction 4+ sitting 4+ sitting  Hip adduction 4+  sitting 4+ sitting  Hip internal rotation    Hip external rotation    Knee flexion 5 5  Knee extension 5 5  Ankle dorsiflexion    Ankle plantarflexion 5 5  Ankle inversion    Ankle eversion     (Blank rows = not tested)    FUNCTIONAL TESTS:  5 times sit to stand: 8.74 sec Berg Balance Scale: 49/56 Functional gait assessment: 22/30 mCTSIB: condition 1: 30 sec, condition 2: 30 sec, condition 3: 30 sec, condition 4: 30 sec but with increased sway/unsteadiness     GAIT: Distance walked: To back of clinic Assistive device utilized: None Level of assistance: Complete Independence Comments: [facility]   [facility] Adult PT Treatment:                                                DATE: 07/28/2022 Therapeutic Activity: Fwd  tandem amb w/long fixation 4L Rocker Board: A/P & M/L rocking --> added Y/P head turns with rocking Resisted side stepping  GTB (crossed at ankles) 2L Resisted diagonal stepping GTB (crossed at ankles) FWD/BKWD Isometric heel raises (hands against wall): DL x10 & SL x15 each Pulleys: resisted FWD walking (25#) & BKWD walking (35#)   [facility] Adult PT Treatment:                                                DATE: 07/25/2022 Therapeutic Activity: Fwd march w/opposite knee taps 1L (full track) Lateral march w/opposite knee taps 2L Fwd & Bkwd amb + Y/P head turns 2L each Fwd/Bkwd amb EO/EC every 3 steps --> EC 2L each SLS + 2#MB toss on rebounder B Heel raises --> single leg raises --> isometric single leg raises Gastroc stretch on slantboard + ABC intake Berg Balance test   PATIENT EDUCATION:  Education details: Progressing HEP Person educated: Patient Education method: Consulting civil engineer, Media planner, and Handouts Education comprehension: verbalized understanding, returned demonstration, and needs further education  HOME EXERCISE PROGRAM: Access Code: [email or web address] URL: [email or web address] Date: 07/28/2022 Prepared by: Helane Gunther  Exercises - Romberg  Stance with Head Nods  - 2 x daily - 7 x weekly - 1 sets - 10 reps - 10-5 sec hold - Corner Balance Feet Apart: Eyes Closed With Head Turns  - 2 x daily - 7 x weekly - 1 sets - 10 reps - 10-5 sec hold - Tandem Stance with Head Rotation  - 2 x daily - 7 x weekly - 1 sets - 10 reps - 10-5 sec hold - Tandem Stance with Head Nods   - 2 x daily - 7 x weekly - 1 sets - 10 reps - 10-5 sec hold - Romberg Stance with Eyes Closed  - 2 x daily - 7 x weekly - 1 sets - 10 reps - 10-5 sec hold - Supine Cervical Retraction with Towel  - 1 x daily - 7 x weekly - 3 sets - 10 reps - 3 sec hold - Standing Cervical Retraction  - 1 x daily - 7 x weekly - 3 sets - 10 reps - 3 sec hold - Side Stepping with Resistance at Ankles  - 1 x daily - 7 x weekly - 3 sets - 10 reps - Single Leg Heel Raise  - 1 x daily - 7 x weekly - 3 sets - 10 reps - Single Leg Isometric Heel Raise at Wall  - 1 x daily - 7 x weekly - 3 sets - 10 reps - Side Stepping with Resistance at Ankles  - 1 x daily - 7 x weekly - 3 sets - 10 reps - Forward Monster Walk with Resistance at Ankles and Counter Support  - 1 x daily - 7 x weekly - 3 sets - 10 reps - Backward Monster Walk with Resistance at Ankles and Counter Support  - 1 x daily - 7 x weekly - 3 sets - 10 reps   ASSESSMENT:  CLINICAL IMPRESSION: Improved ankle strategy exhibited with NBOS ambulation; added head turns with tandem ambulation attempted, however discontinued due to patient becoming agitated with challenge to balance. Functional hip strengthening progressed with resisted ambulation in all planes.    OBJECTIVE IMPAIRMENTS: Abnormal gait, decreased activity tolerance, decreased balance, difficulty walking, decreased ROM, decreased strength, postural dysfunction, and  obesity.   ACTIVITY LIMITATIONS: carrying, lifting, stairs, locomotion level, and caring for others  PARTICIPATION LIMITATIONS: community activity and yard work  PERSONAL FACTORS: Past/current experiences and Time  since onset of injury/illness/exacerbation are also affecting patient's functional outcome.   REHAB POTENTIAL: Good  CLINICAL DECISION MAKING: Evolving/moderate complexity  EVALUATION COMPLEXITY: Moderate   GOALS: Goals reviewed with patient? Yes  SHORT TERM GOALS: Target date: 07/18/2022  Pt will be ind with initial HEP Baseline: Goal status: MET    LONG TERM GOALS: Target date: 08/15/2022  Pt will be ind with progression and advancement of HEP.  Baseline:  Goal status: MET 07/28/22  2.  Pt will demo improved Berg Balance Score to >/=54/56 for low fall risk Baseline:  Goal status: IN PROGRESS 52/56 on 07/25/22   3.  Pt will have improved FGA to >/=26/30 Baseline:  Goal status: MET 28/30 on 07/18/22  4.  Pt will have improved ABC score to >/=90% Baseline:  Goal status: IN PROGRESS 86.3% on 07/25/22    PLAN:  PT FREQUENCY: 2x/week  PT DURATION: 8 weeks  PLANNED INTERVENTIONS: Therapeutic exercises, Therapeutic activity, Neuromuscular re-education, Balance training, Gait training, Patient/Family education, Self Care, Joint mobilization, Vestibular training, Dry Needling, Electrical stimulation, Spinal mobilization, Cryotherapy, Moist heat, Taping, Ionotophoresis '4mg'$ /ml Dexamethasone, Manual therapy, and Re-evaluation  PLAN FOR NEXT SESSION: Work on high level balance -- narrow BOS, unstable surfaces, challenge vestibular system, weight shifting, obstacle negotiation. D/C at end of [place], PTA 07/28/2022

## 2022-07-31 ENCOUNTER — Encounter: Payer: Self-pay | Admitting: Diagnostic Neuroimaging

## 2022-07-31 ENCOUNTER — Ambulatory Visit: Payer: Medicare Other | Admitting: Diagnostic Neuroimaging

## 2022-07-31 VITALS — BP 129/77 | HR 58 | Ht 68.0 in | Wt 265.4 lb

## 2022-07-31 DIAGNOSIS — R413 Other amnesia: Secondary | ICD-10-CM | POA: Diagnosis not present

## 2022-07-31 NOTE — Progress Notes (Signed)
[facility]  PATIENT: David [name] DOB: Feb 22, 1951  REFERRING CLINICIAN: Stamey, Girtha Rm, FNP HISTORY FROM: patient  REASON FOR VISIT: new consult   HISTORICAL  CHIEF COMPLAINT:  Chief Complaint  Patient presents with   New Patient (Initial Visit)    Patient in room #6 and alone. Patient states he here to discuss his memory loss.    HISTORY OF PRESENT ILLNESS:   72 year old male here for evaluation of memory loss.  Symptoms started around 2023.  He notes some mild difficulty with recall of names, facts and mild slowing down of cognitive functioning.  However no major changes in ADLs.  Has had some issues with chronic insomnia and chronic pain issues.  He is on a variety of pain medications.  Father passed away at age 30 years old with some type of rapid progressive dementia condition, possible Creutzfeldt-Jakob disease.    REVIEW OF SYSTEMS: Full 14 system review of systems performed and negative with exception of: as per HPI.  ALLERGIES: Allergies  Allergen Reactions   Codeine Itching   Dilaudid [Hydromorphone Hcl] Itching   Fentanyl Itching    Pt states Fentanyl patch causes itching   Nucynta [Tapentadol] Other (See Comments)    MENTAL PROBLEMS   Penicillins Itching    Has patient had a PCN reaction causing immediate rash, facial/tongue/throat swelling, SOB or lightheadedness with hypotension:unsure childhood reaction Has patient had a PCN reaction causing severe rash involving mucus membranes or skin necrosis:unsure Has patient had a PCN reaction that required hospitalization:No Has patient had a PCN reaction occurring within the last 10 years:NO If all of the above answers are "NO", then may proceed with Cephalosporin use.     HOME MEDICATIONS: Outpatient Medications Prior to Visit  Medication Sig Dispense Refill   celecoxib (CELEBREX) 200 MG capsule TAKE 1 CAPSULE BY MOUTH TWICE A DAY BETWEEN MEALS AS NEEDED 60 capsule 1   DULoxetine  (CYMBALTA) 60 MG capsule Take 60 mg by mouth 2 (two) times daily.      folic acid (FOLVITE) 1 MG tablet Take 2 tablets (2 mg total) by mouth daily. 180 tablet 3   gabapentin (NEURONTIN) 300 MG capsule TAKE THREE CAPSULES BY MOUTH THREE TIMES A DAY (Patient taking differently: Take 600 mg by mouth 2 (two) times daily.) 90 capsule 3   hydrochlorothiazide (HYDRODIURIL) 25 MG tablet Take 25 mg by mouth daily.     HYDROcodone-acetaminophen (NORCO/VICODIN) 5-325 MG tablet Take 1-2 tablets by mouth every 4 (four) hours as needed for moderate pain or severe pain. 20 tablet 0   hydroxychloroquine (PLAQUENIL) 200 MG tablet Take 1 tablet (200 mg total) by mouth 2 (two) times daily. 180 tablet 0   methocarbamol (ROBAXIN) 750 MG tablet Take 1 tablet (750 mg total) by mouth every 8 (eight) hours as needed for muscle spasms. 60 tablet 1   methotrexate 50 MG/2ML injection INJECT 1ML UNDER THE SKIN ONCE WEEKLY 12 mL 0   metoprolol succinate (TOPROL-XL) 25 MG 24 hr tablet Take 25 mg by mouth daily.     omeprazole (PRILOSEC OTC) 20 MG tablet Take 20 mg by mouth daily.     ondansetron (ZOFRAN-ODT) 8 MG disintegrating tablet Take 1 tablet (8 mg total) by mouth every 8 (eight) hours as needed for nausea or vomiting. 10 tablet 1   pantoprazole (PROTONIX) 40 MG tablet Take 40 mg by mouth daily.     rOPINIRole (REQUIP) 4 MG tablet Take 4 mg by mouth every evening.  simvastatin (ZOCOR) 40 MG tablet Take 40 mg by mouth daily.     sulfaSALAzine (AZULFIDINE) 500 MG tablet TAKE 2 TABLETS BY MOUTH TWICE A DAY 360 tablet 0   tamsulosin (FLOMAX) 0.4 MG CAPS capsule Take 0.4 mg by mouth daily.      tiZANidine (ZANAFLEX) 4 MG tablet Take 1 tablet (4 mg total) by mouth every 8 (eight) hours as needed for muscle spasms. 40 tablet 1   traZODone (DESYREL) 50 MG tablet Take 50 mg by mouth at bedtime.     Tuberculin-Allergy Syringes (BD ALLERGIST TRAY) 27G X 1/2" 1 ML KIT USE TO INJECT METHOTREXATE ONCE WEEKLY 1 kit 1    Tuberculin-Allergy Syringes 27G X 1/2" 1 ML MISC Use to inject methotrexate once weekly. 12 each 3   UNABLE TO FIND daily. Med Name: Aspirin-Tylenol-Caffeine     No facility-administered medications prior to visit.    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    RLS (restless legs syndrome)    Sleep apnea    could not use a cpap   Snores    Status post gastric banding    Wears glasses    driving    PAST SURGICAL HISTORY: Past Surgical History:  Procedure Laterality Date   ANTERIOR CERVICAL DECOMPRESSION/DISCECTOMY FUSION 4 LEVELS N/A 02/26/2020   Procedure: ANTERIOR CERVICAL DECOMPRESSION/DISCECTOMY FUSION, INTERBODY PROSTHESIS, PLATE/SCREWS CERVICAL THREE-FOUR CERVICAL FOUR-FIVE CERVICAL FIVE-SIX- CERVICAL SIX-SEVEN;  Surgeon: Newman Pies, MD;  Location: [place];  Service: Neurosurgery;  Laterality: N/A;   CARPAL TUNNEL RELEASE Right 09/26/2012   Procedure: CARPAL TUNNEL RELEASE;  Surgeon: Tennis Must, MD;  Location: [place];  Service: Orthopedics;  Laterality: Right;   CARPAL TUNNEL RELEASE Left 10/24/2012   Procedure: CARPAL TUNNEL RELEASE;  Surgeon: Tennis Must, MD;  Location: [place];  Service: Orthopedics;  Laterality: Left;   EXCISION MELANOMA WITH SENTINEL LYMPH NODE BIOPSY  1984   rt neck  with mulpipal nodes and some muscle excised rt neck-shoulder   KNEE ARTHROPLASTY     knee replacement     LAPAROSCOPIC GASTRIC BANDING  03/15/2009   LAPAROSCOPIC GASTRIC BANDING     MUSCLE BIOPSY  2004   rt thigh to r/o myocitis   nerve conduction velosity test     SHOULDER SURGERY Right 12/2021   TONSILLECTOMY     TOTAL KNEE ARTHROPLASTY Bilateral 12/05/2010   TRIGGER FINGER RELEASE Right 09/26/2012   Procedure: RELEASE TRIGGER FINGER/A-1 PULLEY RING AND SMALL ;  Surgeon: Tennis Must, MD;  Location: [place];  Service: Orthopedics;  Laterality: Right;   TRIGGER FINGER RELEASE Left 10/24/2012   Procedure: RELEASE TRIGGER FINGER/A-1 PULLEY LEFT MIDDLE AND LEFT RING;  Surgeon: Tennis Must,  MD;  Location: [place];  Service: Orthopedics;  Laterality: Left;   UPPER GI ENDOSCOPY      FAMILY HISTORY: Family History  Problem Relation Age of Onset   Heart disease Mother    Alzheimer's disease Father     SOCIAL HISTORY: Social History   Socioeconomic History   Marital status: Married    Spouse name: Not on file   Number of children: Not on file   Years of education: Not on file   Highest education level: Not on file  Occupational History   Not on file  Tobacco Use   Smoking status: Former    Packs/day: 1.00    Years: 30.00    Total pack years: 30.00    Types: Cigarettes    Quit date: 09/23/2001    Years since quitting: 20.8    Passive exposure: Never   Smokeless tobacco: Never  Vaping Use   Vaping Use: Never used  Substance and Sexual Activity   Alcohol use: Not Currently    Comment: rare 2-3 per year   Drug use: No   Sexual activity: Not on file  Other Topics Concern   Not on file  Social History Narrative   Not on file   Social Determinants of Health   Financial Resource Strain: Not on file  Food Insecurity: Not on file  Transportation Needs: Not on file  Physical Activity: Not on file  Stress: Not on file  Social  Connections: Not on file  Intimate Partner Violence: Not on file     PHYSICAL EXAM  GENERAL EXAM/CONSTITUTIONAL: Vitals:  Vitals:   07/31/22 0849  BP: 129/77  Pulse: (!) 58  Weight: 265 lb 6.4 oz (120.4 kg)  Height: '5\' 8"'$  (1.727 m)   Body mass index is 40.35 kg/m. Wt Readings from Last 3 Encounters:  07/31/22 265 lb 6.4 oz (120.4 kg)  07/20/22 259 lb (117.5 kg)  05/17/22 262 lb (118.8 kg)   Patient is in no distress; well developed, nourished and groomed; neck is supple  CARDIOVASCULAR: Examination of carotid arteries is normal; no carotid bruits Regular rate and rhythm, no murmurs Examination of peripheral vascular system by observation and palpation is normal  EYES: Ophthalmoscopic exam of optic discs and posterior segments is normal; no papilledema or hemorrhages No results found.  MUSCULOSKELETAL: Gait, strength, tone, movements noted in Neurologic exam below  NEUROLOGIC: MENTAL STATUS:     07/31/2022    8:54 AM  MMSE - Mini Mental State Exam  Orientation to time 5  Orientation to Place 5  Registration 3  Attention/ Calculation 5  Recall 3  Language- name 2 objects 2  Language- repeat 1  Language- follow 3 step command 3  Language- read & follow direction 1  Write a sentence 1  Copy design 1  Total score 30   awake, alert, oriented to person, place and time recent and remote memory intact normal attention and concentration language fluent, comprehension intact, naming intact fund of knowledge appropriate  CRANIAL NERVE:  2nd - no papilledema on fundoscopic exam 2nd, 3rd, 4th, 6th - pupils equal and reactive to light, visual fields full to confrontation, extraocular muscles intact, no nystagmus 5th - facial sensation symmetric 7th - facial strength symmetric 8th - hearing intact 9th - palate elevates symmetrically, uvula midline 11th - shoulder shrug symmetric 12th - tongue protrusion midline  MOTOR:  normal bulk and tone, full strength in  the BUE, BLE  SENSORY:  normal and symmetric to light  touch, temperature, vibration  COORDINATION:  finger-nose-finger, fine finger movements normal  REFLEXES:  deep tendon reflexes present and symmetric  GAIT/STATION:  narrow based gait     DIAGNOSTIC DATA (LABS, IMAGING, TESTING) - I reviewed patient records, labs, notes, testing and imaging myself where available.  Lab Results  Component Value Date   WBC 6.9 07/20/2022   HGB 15.6 07/20/2022   HCT 47.3 07/20/2022   MCV 94.0 07/20/2022   PLT 277 07/20/2022      Component Value Date/Time   NA 138 07/20/2022 0927   NA 138 03/03/2016 0000   K 4.9 07/20/2022 0927   CL 101 07/20/2022 0927   CO2 30 07/20/2022 0927   GLUCOSE 98 07/20/2022 0927   BUN 15 07/20/2022 0927   BUN 18 03/03/2016 0000   CREATININE 1.04 07/20/2022 0927   CALCIUM 10.0 07/20/2022 0927   PROT 6.9 07/20/2022 0927   ALBUMIN 3.8 09/14/2018 1552   AST 28 07/20/2022 0927   ALT 23 07/20/2022 0927   ALKPHOS 72 09/14/2018 1552   BILITOT 0.5 07/20/2022 0927   GFRNONAA >60 05/17/2022 0505   GFRNONAA 79 11/12/2020 1107   GFRAA 91 11/12/2020 1107   No results found for: "CHOL", "HDL", "LDLCALC", "LDLDIRECT", "TRIG", "CHOLHDL" No results found for: "HGBA1C" No results found for: "VITAMINB12" No results found for: "TSH"   07/07/17 MRI lumbar spine - Mild spinal stenosis L1-2 - Moderate spinal stenosis L2-3 with moderate subarticular stenosis bilaterally - Small central and left-sided disc protrusion with moderate spinal stenosis. Subarticular stenosis left greater than right - Mild spinal stenosis with moderate subarticular stenosis bilaterally L4-5 - Mild subarticular stenosis bilaterally L5-S1  12/04/19 MRI cervical spine  - Cervical spondylosis as outlined and most notably as follows. - At C5-C6, there is multifactorial mild/moderate spinal canal stenosis with minimal flattening of the ventral spinal cord. Severe bilateral neural foraminal  narrowing. - No more than mild spinal canal stenosis at the remaining levels. Additional sites of neural foraminal narrowing as detailed and greatest on the right at C3-C4, bilaterally at C4-C5 and bilaterally at C6-C7 (severe at these sites).   ASSESSMENT AND PLAN  72 y.o. year old male here with:   Dx:  1. Memory loss     PLAN:  MEMORY LOSS (since ~2023) - could be related to underlying issues of insomnia, OSA, chronic pain (rheumatoid arthritis) - check MRI brain, labs - daily physical activity / exercise (at least 15-30 minutes) - eat more plants / vegetables - increase social activities, brain stimulation, games, puzzles, hobbies, crafts, arts, music - aim for at least 7-8 hours sleep per night (or more) - avoid smoking and alcohol - caution with medications, finances, driving  Orders Placed This Encounter  Procedures   MR BRAIN W WO CONTRAST   Vitamin B12   TSH   Return for pending if symptoms worsen or fail to improve, pending test results.    Penni Bombard, MD [id number], [id number] AM Certified in Neurology, Neurophysiology and Neuroimaging  [facility] [address] [place], [place] [address] [phone]

## 2022-07-31 NOTE — Patient Instructions (Signed)
MEMORY LOSS (since ~2023) - could be related to underlying issues of insomnia, OSA, chronic pain (rheumatoid arthritis) - check MRI brain, labs - daily physical activity / exercise (at least 15-30 minutes) - eat more plants / vegetables - increase social activities, brain stimulation, games, puzzles, hobbies, crafts, arts, music - aim for at least 7-8 hours sleep per night (or more) - avoid smoking and alcohol - caution with medications, finances, driving

## 2022-08-01 ENCOUNTER — Ambulatory Visit: Payer: Medicare Other

## 2022-08-01 DIAGNOSIS — R2689 Other abnormalities of gait and mobility: Secondary | ICD-10-CM | POA: Diagnosis not present

## 2022-08-01 DIAGNOSIS — R262 Difficulty in walking, not elsewhere classified: Secondary | ICD-10-CM

## 2022-08-01 DIAGNOSIS — R2681 Unsteadiness on feet: Secondary | ICD-10-CM | POA: Diagnosis not present

## 2022-08-01 NOTE — Therapy (Addendum)
OUTPATIENT PHYSICAL THERAPY LOWER EXTREMITY TREATMENT AND DISCHARGE   PHYSICAL THERAPY DISCHARGE SUMMARY  Visits from Start of Care: 12  Current functional level related to goals / functional outcomes: See below. Pt has met all of his LTGs.   Remaining deficits: Pt is no longer considered a high fall risk based on balance assessments   Education / Equipment: See below.    Patient agrees to discharge. Patient goals were met. Patient is being discharged due to meeting the stated rehab goals.   Patient Name: David [name] MRN: [id number] DOB:1950-09-23, 72 y.o., male Today's Date: 08/01/2022  END OF SESSION:  PT End of Session - 08/01/22 0802     Visit Number 12    Date for PT Re-Evaluation 08/15/22    Authorization Type BCBS Medicare    Progress Note Due on Visit 15    PT Start Time 0803    PT Stop Time 0831    PT Time Calculation (min) 28 min    Activity Tolerance Patient tolerated treatment well    Behavior During Therapy [facility] for tasks assessed/performed             Past Medical History:  Diagnosis Date   Arthritis    Balance problem    BBB (bundle branch block) 03/15/2016   Carpal tunnel syndrome    DDD (degenerative disc disease), lumbar 03/15/2016   Depression    Dysrhythmia    had some tachycardia with steroids   Elevated cholesterol 03/15/2016   Headache(784.0)    Ocular Migraines - takes Ambien for it   Heart murmur    HTN (hypertension) 03/15/2016   Hyperlipemia    Hypertension    Malignant melanoma of right side of neck ([place]) 03/15/2016   30 years ago    Neuralgia 03/15/2016   Ocular migraine    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    RLS (restless legs syndrome)    Sleep apnea    could not use a cpap   Snores    Status post gastric banding    Wears glasses    driving   Past Surgical History:  Procedure Laterality Date   ANTERIOR CERVICAL  DECOMPRESSION/DISCECTOMY FUSION 4 LEVELS N/A 02/26/2020   Procedure: ANTERIOR CERVICAL DECOMPRESSION/DISCECTOMY FUSION, INTERBODY PROSTHESIS, PLATE/SCREWS CERVICAL THREE-FOUR CERVICAL FOUR-FIVE CERVICAL FIVE-SIX- CERVICAL SIX-SEVEN;  Surgeon: Newman Pies, MD;  Location: [place];  Service: Neurosurgery;  Laterality: N/A;   CARPAL TUNNEL RELEASE Right 09/26/2012   Procedure: CARPAL TUNNEL RELEASE;  Surgeon: Tennis Must, MD;  Location: [place];  Service: Orthopedics;  Laterality: Right;   CARPAL TUNNEL RELEASE Left 10/24/2012   Procedure: CARPAL TUNNEL RELEASE;  Surgeon: Tennis Must, MD;  Location: [place];  Service: Orthopedics;  Laterality: Left;   EXCISION MELANOMA WITH SENTINEL LYMPH NODE BIOPSY  1984   rt neck with mulpipal nodes and some muscle excised rt neck-shoulder   KNEE ARTHROPLASTY     knee replacement     LAPAROSCOPIC GASTRIC BANDING  03/15/2009   LAPAROSCOPIC GASTRIC BANDING     MUSCLE BIOPSY  2004   rt thigh to r/o myocitis   nerve conduction velosity test     SHOULDER SURGERY Right 12/2021   TONSILLECTOMY     TOTAL KNEE ARTHROPLASTY Bilateral 12/05/2010   TRIGGER FINGER RELEASE Right 09/26/2012   Procedure: RELEASE TRIGGER FINGER/A-1 PULLEY RING AND SMALL ;  Surgeon: Tennis Must,  MD;  Location: [place];  Service: Orthopedics;  Laterality: Right;   TRIGGER FINGER RELEASE Left 10/24/2012   Procedure: RELEASE TRIGGER FINGER/A-1 PULLEY LEFT MIDDLE AND LEFT RING;  Surgeon: Tennis Must, MD;  Location: [place];  Service: Orthopedics;  Laterality: Left;   UPPER GI ENDOSCOPY     Patient Active Problem List   Diagnosis Date Noted   Cervical spondylosis with radiculopathy 02/26/2020   SBO (small bowel obstruction) ([place]) 08/21/2017   Trochanteric bursitis of left hip 01/18/2017   Lateral epicondylitis, right elbow 01/18/2017   Rheumatoid arthritis ([place]) 03/15/2016   DJD (degenerative joint disease),  cervical 03/15/2016   DDD (degenerative disc disease), lumbar 03/15/2016   HTN (hypertension) 03/15/2016   Obesity 03/15/2016   Elevated cholesterol 03/15/2016   Neuralgia 03/15/2016   Malignant melanoma of right side of neck ([place]) 03/15/2016   BBB (bundle branch block) 03/15/2016   High risk medication use 03/15/2016   Osteoarthritis of lumbar spine 03/15/2016   Primary osteoarthritis of both hands 03/15/2016   Primary osteoarthritis of both knees 03/15/2016   Mesenteric adenitis 06/29/2015    PCP: Stamey, Girtha Rm, FNP  REFERRING PROVIDER: Sheran Spine, FNP  REFERRING DIAG: R26.9 (ICD-10-CM) - Unspecified abnormalities of gait and mobility  THERAPY DIAG:  Unsteadiness on feet  Difficulty in walking, not elsewhere classified  Other abnormalities of gait and mobility  Rationale for Evaluation and Treatment: Rehabilitation  ONSET DATE: Within the last year  SUBJECTIVE:   SUBJECTIVE STATEMENT:  Patient reports he is very sore today due to doing squat variations at home. Patient states he had his appointment with neurologist for memory issues, states he will have MRI scheduled for additional testing.    PERTINENT HISTORY: Bilat knee replacements, R shoulder replacement, ACDF (4 levels)  PAIN:  Are you having pain? Yes: NPRS scale: 0 currently, 3 at worst/10 Pain location: R shoulder Pain description: dull/achy Aggravating factors: Reaching Relieving factors: rest/ice  PRECAUTIONS: Fall  WEIGHT BEARING RESTRICTIONS: No  FALLS:  Has patient fallen in last 6 months? Yes. Number of falls 2  LIVING ENVIRONMENT: Lives with: lives with their spouse, daughter and grandchildren are 3 houses down Lives in: House/apartment Stairs: Yes: External: 3 steps; in garage with railing on L Has following equipment at home: None  OCCUPATION: Retired  PLOF: Independent  PATIENT GOALS: Improve balance  NEXT MD VISIT:   OBJECTIVE:   DIAGNOSTIC FINDINGS: none  PATIENT  SURVEYS:  ABC scale: 88.43%  COGNITION: Overall cognitive status: Within functional limits for tasks assessed     SENSATION: WFL  MUSCLE LENGTH: Hamstrings: did not assess Thomas test: did not assess  POSTURE: No Significant postural limitations  PALPATION: Nothing relevant to referral for gait  LOWER EXTREMITY ROM: WFL  LOWER EXTREMITY MMT:  MMT Right eval Left eval  Hip flexion 4 4  Hip extension    Hip abduction 4+ sitting 4+ sitting  Hip adduction 4+ sitting 4+ sitting  Hip internal rotation    Hip external rotation    Knee flexion 5 5  Knee extension 5 5  Ankle dorsiflexion    Ankle plantarflexion 5 5  Ankle inversion    Ankle eversion     (Blank rows = not tested)    FUNCTIONAL TESTS:  5 times sit to stand: 8.74 sec Berg Balance Scale: 49/56 Functional gait assessment: 22/30 mCTSIB: condition 1: 30 sec, condition 2: 30 sec, condition 3: 30 sec, condition 4: 30 sec but with increased sway/unsteadiness  GAIT: Distance walked: To back of clinic Assistive device utilized: None Level of assistance: Complete Independence Comments: [facility]   [facility] Adult PT Treatment:                                                DATE: 08/01/2022 Therapeutic Activity: NuStep L5 x 5 min + subjective intake Berg testing: SLB, tandem balance, reaching beyond BOS ABC intake    [facility] Adult PT Treatment:                                                DATE: 07/28/2022 Therapeutic Activity: Fwd tandem amb w/long fixation 4L Rocker Board: A/P & M/L rocking --> added Y/P head turns with rocking Resisted side stepping  GTB (crossed at ankles) 2L Resisted diagonal stepping GTB (crossed at ankles) FWD/BKWD Isometric heel raises (hands against wall): DL x10 & SL x15 each Pulleys: resisted FWD walking (25#) & BKWD walking (35#)   PATIENT EDUCATION:  Education details: Progressing HEP Person educated: Patient Education method: Explanation, Demonstration, and Handouts Education  comprehension: verbalized understanding, returned demonstration, and needs further education  HOME EXERCISE PROGRAM: Access Code: [email or web address] URL: [email or web address] Date: 07/28/2022 Prepared by: Helane Gunther  Exercises - Romberg Stance with Head Nods  - 2 x daily - 7 x weekly - 1 sets - 10 reps - 10-5 sec hold - Corner Balance Feet Apart: Eyes Closed With Head Turns  - 2 x daily - 7 x weekly - 1 sets - 10 reps - 10-5 sec hold - Tandem Stance with Head Rotation  - 2 x daily - 7 x weekly - 1 sets - 10 reps - 10-5 sec hold - Tandem Stance with Head Nods   - 2 x daily - 7 x weekly - 1 sets - 10 reps - 10-5 sec hold - Romberg Stance with Eyes Closed  - 2 x daily - 7 x weekly - 1 sets - 10 reps - 10-5 sec hold - Supine Cervical Retraction with Towel  - 1 x daily - 7 x weekly - 3 sets - 10 reps - 3 sec hold - Standing Cervical Retraction  - 1 x daily - 7 x weekly - 3 sets - 10 reps - 3 sec hold - Side Stepping with Resistance at Ankles  - 1 x daily - 7 x weekly - 3 sets - 10 reps - Single Leg Heel Raise  - 1 x daily - 7 x weekly - 3 sets - 10 reps - Single Leg Isometric Heel Raise at Wall  - 1 x daily - 7 x weekly - 3 sets - 10 reps - Side Stepping with Resistance at Ankles  - 1 x daily - 7 x weekly - 3 sets - 10 reps - Forward Monster Walk with Resistance at Ankles and Counter Support  - 1 x daily - 7 x weekly - 3 sets - 10 reps - Backward Monster Walk with Resistance at Ankles and Counter Support  - 1 x daily - 7 x weekly - 3 sets - 10 reps   ASSESSMENT:  CLINICAL IMPRESSION: Berg balance score re-tested with patient receiving full score on all tasks. Patient demonstrated good progression with dynamic balance and postural strengthening, most  significantly with single leg stance and narrow base of support activities. Patient is in agreement to end physical therapy due to meeting all goals as outlined in [place] and continue to HEP independently as needed to maintain safe balance  level and overall postural strengthening.    OBJECTIVE IMPAIRMENTS: Abnormal gait, decreased activity tolerance, decreased balance, difficulty walking, decreased ROM, decreased strength, postural dysfunction, and obesity.   ACTIVITY LIMITATIONS: carrying, lifting, stairs, locomotion level, and caring for others  PARTICIPATION LIMITATIONS: community activity and yard work  PERSONAL FACTORS: Past/current experiences and Time since onset of injury/illness/exacerbation are also affecting patient's functional outcome.   REHAB POTENTIAL: Good  CLINICAL DECISION MAKING: Evolving/moderate complexity  EVALUATION COMPLEXITY: Moderate   GOALS: Goals reviewed with patient? Yes  SHORT TERM GOALS: Target date: 07/18/2022  Pt will be ind with initial HEP Baseline: Goal status: MET    LONG TERM GOALS: Target date: 08/15/2022  Pt will be ind with progression and advancement of HEP.  Baseline:  Goal status: MET 07/28/22  2.  Pt will demo improved Berg Balance Score to >/=54/56 for low fall risk Baseline:  Goal status: MET 56/56 on 08/01/22   3.  Pt will have improved FGA to >/=26/30 Baseline:  Goal status: MET 28/30 on 07/18/22  4.  Pt will have improved ABC score to >/=90% Baseline:  Goal status: MET 98.8% on 08/01/22    PLAN:  PT FREQUENCY: 2x/week  PT DURATION: 8 weeks  PLANNED INTERVENTIONS: Therapeutic exercises, Therapeutic activity, Neuromuscular re-education, Balance training, Gait training, Patient/Family education, Self Care, Joint mobilization, Vestibular training, Dry Needling, Electrical stimulation, Spinal mobilization, Cryotherapy, Moist heat, Taping, Ionotophoresis '4mg'$ /ml Dexamethasone, Manual therapy, and Re-evaluation  PLAN FOR NEXT SESSION: Discharge   Helane Gunther, PTA 08/01/2022

## 2022-08-02 LAB — VITAMIN B12: Vitamin B-12: 543 pg/mL (ref 232–1245)

## 2022-08-02 LAB — TSH: TSH: 3.92 u[IU]/mL (ref 0.450–4.500)

## 2022-08-10 DIAGNOSIS — K080 Exfoliation of teeth due to systemic causes: Secondary | ICD-10-CM | POA: Diagnosis not present

## 2022-08-14 ENCOUNTER — Telehealth: Payer: Self-pay | Admitting: Diagnostic Neuroimaging

## 2022-08-14 NOTE — Telephone Encounter (Signed)
[employer] medicare Josem Kaufmann: [id number] exp. 08/14/22-09/12/22 sent to GI

## 2022-08-19 ENCOUNTER — Other Ambulatory Visit: Payer: Self-pay

## 2022-08-19 ENCOUNTER — Encounter (HOSPITAL_BASED_OUTPATIENT_CLINIC_OR_DEPARTMENT_OTHER): Payer: Self-pay | Admitting: Emergency Medicine

## 2022-08-19 ENCOUNTER — Inpatient Hospital Stay (HOSPITAL_BASED_OUTPATIENT_CLINIC_OR_DEPARTMENT_OTHER)
Admission: EM | Admit: 2022-08-19 | Discharge: 2022-08-21 | DRG: 872 | Disposition: A | Discharge status: Home / Self Care | Payer: Medicare Other | Attending: Family Medicine | Admitting: Family Medicine

## 2022-08-19 ENCOUNTER — Emergency Department (HOSPITAL_BASED_OUTPATIENT_CLINIC_OR_DEPARTMENT_OTHER): Payer: Medicare Other

## 2022-08-19 DIAGNOSIS — Z9884 Bariatric surgery status: Secondary | ICD-10-CM | POA: Diagnosis not present

## 2022-08-19 DIAGNOSIS — K8020 Calculus of gallbladder without cholecystitis without obstruction: Secondary | ICD-10-CM | POA: Diagnosis present

## 2022-08-19 DIAGNOSIS — E669 Obesity, unspecified: Secondary | ICD-10-CM | POA: Diagnosis not present

## 2022-08-19 DIAGNOSIS — G2581 Restless legs syndrome: Secondary | ICD-10-CM | POA: Diagnosis not present

## 2022-08-19 DIAGNOSIS — Z1152 Encounter for screening for COVID-19: Secondary | ICD-10-CM

## 2022-08-19 DIAGNOSIS — I2510 Atherosclerotic heart disease of native coronary artery without angina pectoris: Secondary | ICD-10-CM | POA: Diagnosis not present

## 2022-08-19 DIAGNOSIS — A419 Sepsis, unspecified organism: Secondary | ICD-10-CM | POA: Diagnosis not present

## 2022-08-19 DIAGNOSIS — E7800 Pure hypercholesterolemia, unspecified: Secondary | ICD-10-CM | POA: Diagnosis present

## 2022-08-19 DIAGNOSIS — G4700 Insomnia, unspecified: Secondary | ICD-10-CM | POA: Diagnosis present

## 2022-08-19 DIAGNOSIS — R7989 Other specified abnormal findings of blood chemistry: Secondary | ICD-10-CM | POA: Diagnosis not present

## 2022-08-19 DIAGNOSIS — Z888 Allergy status to other drugs, medicaments and biological substances status: Secondary | ICD-10-CM

## 2022-08-19 DIAGNOSIS — D72810 Lymphocytopenia: Secondary | ICD-10-CM | POA: Diagnosis not present

## 2022-08-19 DIAGNOSIS — Z79631 Long term (current) use of antimetabolite agent: Secondary | ICD-10-CM

## 2022-08-19 DIAGNOSIS — R55 Syncope and collapse: Secondary | ICD-10-CM | POA: Diagnosis not present

## 2022-08-19 DIAGNOSIS — Z8249 Family history of ischemic heart disease and other diseases of the circulatory system: Secondary | ICD-10-CM

## 2022-08-19 DIAGNOSIS — R509 Fever, unspecified: Secondary | ICD-10-CM

## 2022-08-19 DIAGNOSIS — R0902 Hypoxemia: Secondary | ICD-10-CM | POA: Diagnosis not present

## 2022-08-19 DIAGNOSIS — E876 Hypokalemia: Secondary | ICD-10-CM | POA: Diagnosis not present

## 2022-08-19 DIAGNOSIS — M069 Rheumatoid arthritis, unspecified: Secondary | ICD-10-CM | POA: Diagnosis not present

## 2022-08-19 DIAGNOSIS — Z6841 Body Mass Index (BMI) 40.0 and over, adult: Secondary | ICD-10-CM | POA: Diagnosis not present

## 2022-08-19 DIAGNOSIS — I4510 Unspecified right bundle-branch block: Secondary | ICD-10-CM | POA: Diagnosis present

## 2022-08-19 DIAGNOSIS — N400 Enlarged prostate without lower urinary tract symptoms: Secondary | ICD-10-CM | POA: Diagnosis present

## 2022-08-19 DIAGNOSIS — Z96653 Presence of artificial knee joint, bilateral: Secondary | ICD-10-CM | POA: Diagnosis not present

## 2022-08-19 DIAGNOSIS — Z981 Arthrodesis status: Secondary | ICD-10-CM

## 2022-08-19 DIAGNOSIS — M1990 Unspecified osteoarthritis, unspecified site: Secondary | ICD-10-CM | POA: Diagnosis not present

## 2022-08-19 DIAGNOSIS — I10 Essential (primary) hypertension: Secondary | ICD-10-CM | POA: Diagnosis not present

## 2022-08-19 DIAGNOSIS — M0609 Rheumatoid arthritis without rheumatoid factor, multiple sites: Secondary | ICD-10-CM | POA: Diagnosis not present

## 2022-08-19 DIAGNOSIS — Z79899 Other long term (current) drug therapy: Secondary | ICD-10-CM

## 2022-08-19 DIAGNOSIS — Z820 Family history of epilepsy and other diseases of the nervous system: Secondary | ICD-10-CM

## 2022-08-19 DIAGNOSIS — R0602 Shortness of breath: Secondary | ICD-10-CM

## 2022-08-19 DIAGNOSIS — G4733 Obstructive sleep apnea (adult) (pediatric): Secondary | ICD-10-CM | POA: Diagnosis present

## 2022-08-19 DIAGNOSIS — Z885 Allergy status to narcotic agent status: Secondary | ICD-10-CM

## 2022-08-19 DIAGNOSIS — I700 Atherosclerosis of aorta: Secondary | ICD-10-CM | POA: Diagnosis not present

## 2022-08-19 DIAGNOSIS — Z91010 Allergy to peanuts: Secondary | ICD-10-CM

## 2022-08-19 DIAGNOSIS — Z85820 Personal history of malignant melanoma of skin: Secondary | ICD-10-CM | POA: Diagnosis not present

## 2022-08-19 DIAGNOSIS — Z87891 Personal history of nicotine dependence: Secondary | ICD-10-CM | POA: Diagnosis not present

## 2022-08-19 LAB — I-STAT VENOUS BLOOD GAS, ED
Acid-base deficit: 1 mmol/L (ref 0.0–2.0)
Bicarbonate: 23.4 mmol/L (ref 20.0–28.0)
Calcium, Ion: 1.11 mmol/L — ABNORMAL LOW (ref 1.15–1.40)
HCT: 46 % (ref 39.0–52.0)
Hemoglobin: 15.6 g/dL (ref 13.0–17.0)
O2 Saturation: 71 %
Patient temperature: 100.6
Potassium: 3.9 mmol/L (ref 3.5–5.1)
Sodium: 139 mmol/L (ref 135–145)
TCO2: 24 mmol/L (ref 22–32)
pCO2, Ven: 38.7 mmHg — ABNORMAL LOW (ref 44–60)
pH, Ven: 7.394 (ref 7.25–7.43)
pO2, Ven: 40 mmHg (ref 32–45)

## 2022-08-19 LAB — RESP PANEL BY RT-PCR (RSV, FLU A&B, COVID)  RVPGX2
Influenza A by PCR: NEGATIVE
Influenza B by PCR: NEGATIVE
Resp Syncytial Virus by PCR: NEGATIVE
SARS Coronavirus 2 by RT PCR: NEGATIVE

## 2022-08-19 LAB — CBC WITH DIFFERENTIAL/PLATELET
Abs Immature Granulocytes: 0.02 10*3/uL (ref 0.00–0.07)
Basophils Absolute: 0 10*3/uL (ref 0.0–0.1)
Basophils Relative: 0 %
Eosinophils Absolute: 0.1 10*3/uL (ref 0.0–0.5)
Eosinophils Relative: 1 %
HCT: 48.7 % (ref 39.0–52.0)
Hemoglobin: 16.1 g/dL (ref 13.0–17.0)
Immature Granulocytes: 0 %
Lymphocytes Relative: 3 %
Lymphs Abs: 0.2 10*3/uL — ABNORMAL LOW (ref 0.7–4.0)
MCH: 31 pg (ref 26.0–34.0)
MCHC: 33.1 g/dL (ref 30.0–36.0)
MCV: 93.7 fL (ref 80.0–100.0)
Monocytes Absolute: 0.4 10*3/uL (ref 0.1–1.0)
Monocytes Relative: 4 %
Neutro Abs: 7.4 10*3/uL (ref 1.7–7.7)
Neutrophils Relative %: 92 %
Platelets: 264 10*3/uL (ref 150–400)
RBC: 5.2 MIL/uL (ref 4.22–5.81)
RDW: 15 % (ref 11.5–15.5)
WBC: 8.1 10*3/uL (ref 4.0–10.5)
nRBC: 0 % (ref 0.0–0.2)

## 2022-08-19 LAB — COMPREHENSIVE METABOLIC PANEL
ALT: 33 U/L (ref 0–44)
AST: 40 U/L (ref 15–41)
Albumin: 4.1 g/dL (ref 3.5–5.0)
Alkaline Phosphatase: 74 U/L (ref 38–126)
Anion gap: 10 (ref 5–15)
BUN: 22 mg/dL (ref 8–23)
CO2: 28 mmol/L (ref 22–32)
Calcium: 9.3 mg/dL (ref 8.9–10.3)
Chloride: 100 mmol/L (ref 98–111)
Creatinine, Ser: 1.1 mg/dL (ref 0.61–1.24)
GFR, Estimated: >60 mL/min (ref >60)
Glucose, Bld: 113 mg/dL — ABNORMAL HIGH (ref 70–99)
Potassium: 4.1 mmol/L (ref 3.5–5.1)
Sodium: 138 mmol/L (ref 135–145)
Total Bilirubin: 0.7 mg/dL (ref 0.3–1.2)
Total Protein: 7.5 g/dL (ref 6.5–8.1)

## 2022-08-19 LAB — BRAIN NATRIURETIC PEPTIDE: B Natriuretic Peptide: 32.7 pg/mL (ref 0.0–100.0)

## 2022-08-19 LAB — URINALYSIS, ROUTINE W REFLEX MICROSCOPIC
Bilirubin Urine: NEGATIVE
Glucose, UA: NEGATIVE mg/dL
Hgb urine dipstick: NEGATIVE
Ketones, ur: NEGATIVE mg/dL
Leukocytes,Ua: NEGATIVE
Nitrite: NEGATIVE
Protein, ur: 30 mg/dL — AB
Specific Gravity, Urine: 1.025 (ref 1.005–1.030)
pH: 5.5 (ref 5.0–8.0)

## 2022-08-19 LAB — URINALYSIS, MICROSCOPIC (REFLEX)
RBC / HPF: NONE SEEN RBC/hpf (ref 0–5)
Squamous Epithelial / HPF: NONE SEEN /HPF (ref 0–5)

## 2022-08-19 LAB — TROPONIN I (HIGH SENSITIVITY)
Troponin I (High Sensitivity): 5 ng/L (ref <18)
Troponin I (High Sensitivity): 6 ng/L (ref <18)

## 2022-08-19 LAB — LACTIC ACID, PLASMA
Lactic Acid, Venous: 1.6 mmol/L (ref 0.5–1.9)
Lactic Acid, Venous: 3.2 mmol/L (ref 0.5–1.9)

## 2022-08-19 LAB — D-DIMER, QUANTITATIVE: D-Dimer, Quant: 0.92 ug/mL-FEU — ABNORMAL HIGH (ref 0.00–0.50)

## 2022-08-19 LAB — CK: Total CK: 453 U/L — ABNORMAL HIGH (ref 49–397)

## 2022-08-19 LAB — LIPASE, BLOOD: Lipase: 37 U/L (ref 11–51)

## 2022-08-19 LAB — MAGNESIUM: Magnesium: 1.9 mg/dL (ref 1.7–2.4)

## 2022-08-19 MED ORDER — MORPHINE SULFATE (PF) 4 MG/ML IV SOLN
4.0000 mg | Freq: Once | INTRAVENOUS | Status: AC
  Administered 2022-08-19: 4 mg via INTRAVENOUS
  Filled 2022-08-19: qty 1

## 2022-08-19 MED ORDER — SODIUM CHLORIDE 0.9 % IV BOLUS
1000.0000 mL | Freq: Once | INTRAVENOUS | Status: AC
  Administered 2022-08-19: 1000 mL via INTRAVENOUS

## 2022-08-19 MED ORDER — IOHEXOL 350 MG/ML SOLN
75.0000 mL | Freq: Once | INTRAVENOUS | Status: AC | PRN
  Administered 2022-08-19: 75 mL via INTRAVENOUS

## 2022-08-19 MED ORDER — ACETAMINOPHEN 325 MG PO TABS
650.0000 mg | ORAL_TABLET | Freq: Once | ORAL | Status: AC
  Administered 2022-08-19: 650 mg via ORAL
  Filled 2022-08-19: qty 2

## 2022-08-19 NOTE — ED Notes (Signed)
Pt. Urinated in the bed, upon sitting patient up, got very lightheaded, near syncopal event with dizziness while sitting in bed.  After a few minutes, was able to bear weight, but still very dizzy.  Was able to get patient changed out of soiled shorts, changed into paper scrubs and sat in wheelchair for a few minutes.  Pt is having noted spasms periodically and grimaced in pain, when they come on.  MD Is aware.

## 2022-08-19 NOTE — ED Provider Notes (Signed)
[facility] [facility] Provider Note   CSN: [id number] Arrival date & time: 08/19/22  1726     History  Chief Complaint  Patient presents with   Dizziness    David [name] is a 72 y.o. male.  The history is provided by the patient and medical records. No language interpreter was used.  Dizziness Quality:  Lightheadedness Severity:  Severe Onset quality:  Gradual Duration:  1 day Timing:  Constant Progression:  Waxing and waning Chronicity:  New Relieved by:  Nothing Worsened by:  Nothing Ineffective treatments:  None tried Associated symptoms: shortness of breath   Associated symptoms: no chest pain, no diarrhea, no headaches, no nausea, no palpitations, no syncope, no vision changes, no vomiting and no weakness        Home Medications Prior to Admission medications   Medication Sig Start Date End Date Taking? Authorizing Provider  celecoxib (CELEBREX) 200 MG capsule TAKE 1 CAPSULE BY MOUTH TWICE A DAY BETWEEN MEALS AS NEEDED 07/14/22   Mcarthur Rossetti, MD  DULoxetine (CYMBALTA) 60 MG capsule Take 60 mg by mouth 2 (two) times daily.     [provider]  folic acid (FOLVITE) 1 MG tablet Take 2 tablets (2 mg total) by mouth daily. 02/08/22   Bo Merino, MD  gabapentin (NEURONTIN) 300 MG capsule TAKE THREE CAPSULES BY MOUTH THREE TIMES A DAY Patient taking differently: Take 600 mg by mouth 2 (two) times daily. 04/05/20   Mcarthur Rossetti, MD  hydrochlorothiazide (HYDRODIURIL) 25 MG tablet Take 25 mg by mouth daily.    [provider]  HYDROcodone-acetaminophen (NORCO/VICODIN) 5-325 MG tablet Take 1-2 tablets by mouth every 4 (four) hours as needed for moderate pain or severe pain. 05/17/22   Molpus, John, MD  hydroxychloroquine (PLAQUENIL) 200 MG tablet Take 1 tablet (200 mg total) by mouth 2 (two) times daily. 07/20/22   Ofilia Neas, PA-C  methocarbamol (ROBAXIN) 750 MG tablet Take 1 tablet (750  mg total) by mouth every 8 (eight) hours as needed for muscle spasms. 02/08/22   Mcarthur Rossetti, MD  methotrexate 50 MG/2ML injection INJECT 1ML UNDER THE SKIN ONCE WEEKLY 07/20/22   Ofilia Neas, PA-C  metoprolol succinate (TOPROL-XL) 25 MG 24 hr tablet Take 25 mg by mouth daily. 11/24/14   [provider]  omeprazole (PRILOSEC OTC) 20 MG tablet Take 20 mg by mouth daily.    [provider]  ondansetron (ZOFRAN-ODT) 8 MG disintegrating tablet Take 1 tablet (8 mg total) by mouth every 8 (eight) hours as needed for nausea or vomiting. 05/17/22   Molpus, John, MD  pantoprazole (PROTONIX) 40 MG tablet Take 40 mg by mouth daily. 08/24/21   [provider]  rOPINIRole (REQUIP) 4 MG tablet Take 4 mg by mouth every evening.     [provider]  simvastatin (ZOCOR) 40 MG tablet Take 40 mg by mouth daily.    [provider]  sulfaSALAzine (AZULFIDINE) 500 MG tablet TAKE 2 TABLETS BY MOUTH TWICE A DAY 06/20/22   Ofilia Neas, PA-C  tamsulosin (FLOMAX) 0.4 MG CAPS capsule Take 0.4 mg by mouth daily.     [provider]  tiZANidine (ZANAFLEX) 4 MG tablet Take 1 tablet (4 mg total) by mouth every 8 (eight) hours as needed for muscle spasms. 03/10/22   Mcarthur Rossetti, MD  traZODone (DESYREL) 50 MG tablet Take 50 mg by mouth at bedtime.    [provider]  Tuberculin-Allergy Syringes (BD ALLERGIST TRAY) 27G X 1/2" 1 ML KIT USE TO INJECT METHOTREXATE ONCE WEEKLY 01/26/22   Bo Merino, MD  Tuberculin-Allergy Syringes 27G X 1/2" 1 ML MISC Use to inject methotrexate once weekly. 02/08/22   Bo Merino, MD  UNABLE TO FIND daily. Med Name: Aspirin-Tylenol-Caffeine    [provider]      Allergies    Codeine, Dilaudid [hydromorphone hcl], Fentanyl, Nucynta [tapentadol], and Penicillins    Review of Systems   Review of Systems  Constitutional:  Positive for chills and fatigue. Negative for diaphoresis and fever.  HENT:   Negative for congestion.   Eyes:  Negative for visual disturbance.  Respiratory:  Positive for shortness of breath. Negative for cough, chest tightness and wheezing.   Cardiovascular:  Negative for chest pain, palpitations, leg swelling and syncope.  Gastrointestinal:  Negative for abdominal pain, constipation, diarrhea, nausea and vomiting.  Genitourinary:  Negative for dysuria and flank pain.  Musculoskeletal:  Negative for back pain, neck pain and neck stiffness.  Skin:  Negative for rash.  Neurological:  Positive for light-headedness. Negative for dizziness, seizures, syncope, weakness and headaches.  Psychiatric/Behavioral:  Negative for agitation.   All other systems reviewed and are negative.   Physical Exam Updated Vital Signs BP (!) 142/87   Pulse (!) 106   Temp 99 F (37.2 C) (Oral)   Resp 20   Ht 5\' 8"  (1.727 m)   Wt 120.2 kg   SpO2 97%   BMI 40.29 kg/m  Physical Exam Vitals and nursing note reviewed.  Constitutional:      General: He is not in acute distress.    Appearance: He is well-developed. He is ill-appearing. He is not toxic-appearing or diaphoretic.  HENT:     Head: Normocephalic and atraumatic.     Nose: No congestion or rhinorrhea.     Mouth/Throat:     Mouth: Mucous membranes are dry.     Pharynx: No oropharyngeal exudate or posterior oropharyngeal erythema.  Eyes:     Extraocular Movements: Extraocular movements intact.     Conjunctiva/sclera: Conjunctivae normal.     Pupils: Pupils are equal, round, and reactive to light.  Cardiovascular:     Rate and Rhythm: Regular rhythm. Tachycardia present.     Pulses: Normal pulses.     Heart sounds: No murmur heard. Pulmonary:     Effort: Pulmonary effort is normal. No respiratory distress.     Breath sounds: Rhonchi present. No wheezing or rales.  Chest:     Chest wall: No tenderness.  Abdominal:     General: Abdomen is flat.     Palpations: Abdomen is soft.     Tenderness: There is no abdominal  tenderness. There is no right CVA tenderness, left CVA tenderness, guarding or rebound.  Musculoskeletal:        General: No swelling or tenderness.     Cervical back: Neck supple. No tenderness.     Right lower leg: No edema.     Left lower leg: No edema.  Skin:    General: Skin is warm and dry.     Capillary Refill: Capillary refill takes less than 2 seconds.     Coloration: Skin is not pale.     Findings: No erythema or rash.  Neurological:     General: No focal deficit present.     Mental Status: He is alert.  Psychiatric:        Mood and Affect: Mood normal.  ED Results / Procedures / Treatments   Labs (all labs ordered are listed, but only abnormal results are displayed) Labs Reviewed  CBC WITH DIFFERENTIAL/PLATELET - Abnormal; Notable for the following components:      Result Value   Lymphs Abs 0.2 (*)    All other components within normal limits  COMPREHENSIVE METABOLIC PANEL - Abnormal; Notable for the following components:   Glucose, Bld 113 (*)    All other components within normal limits  CK - Abnormal; Notable for the following components:   Total CK 453 (*)    All other components within normal limits  D-DIMER, QUANTITATIVE (NOT AT [facility]) - Abnormal; Notable for the following components:   D-Dimer, Quant 0.92 (*)    All other components within normal limits  URINALYSIS, ROUTINE W REFLEX MICROSCOPIC - Abnormal; Notable for the following components:   Protein, ur 30 (*)    All other components within normal limits  LACTIC ACID, PLASMA - Abnormal; Notable for the following components:   Lactic Acid, Venous 3.2 (*)    All other components within normal limits  URINALYSIS, MICROSCOPIC (REFLEX) - Abnormal; Notable for the following components:   Bacteria, UA RARE (*)    All other components within normal limits  I-STAT VENOUS BLOOD GAS, ED - Abnormal; Notable for the following components:   pCO2, Ven 38.7 (*)    Calcium, Ion 1.11 (*)    All other components  within normal limits  RESP PANEL BY RT-PCR (RSV, FLU A&B, COVID)  RVPGX2  URINE CULTURE  LACTIC ACID, PLASMA  LIPASE, BLOOD  BRAIN NATRIURETIC PEPTIDE  MAGNESIUM  LACTIC ACID, PLASMA  TROPONIN I (HIGH SENSITIVITY)  TROPONIN I (HIGH SENSITIVITY)    EKG EKG Interpretation  Date/Time:  Saturday August 19 2022 17:39:34 EDT Ventricular Rate:  107 PR Interval:  154 QRS Duration: 153 QT Interval:  362 QTC Calculation: 483 R Axis:   128 Text Interpretation: Sinus tachycardia Right bundle branch block when comapred to prior, previous RBBB still present with some st segment changes in leads V4-V6. No STEMI Confirmed by Antony Blackbird [id number] on 08/19/2022 5:58:01 PM  Radiology CT Angio Chest PE W and/or Wo Contrast  Result Date: 08/19/2022 CLINICAL DATA:  Pulmonary embolism (PE) suspected, low to intermediate prob, positive D-dimer EXAM: CT ANGIOGRAPHY CHEST WITH CONTRAST TECHNIQUE: Multidetector CT imaging of the chest was performed using the standard protocol during bolus administration of intravenous contrast. Multiplanar CT image reconstructions and MIPs were obtained to evaluate the vascular anatomy. RADIATION DOSE REDUCTION: This exam was performed according to the departmental dose-optimization program which includes automated exposure control, adjustment of the mA and/or kV according to patient size and/or use of iterative reconstruction technique. CONTRAST:  18mL OMNIPAQUE IOHEXOL 350 MG/ML SOLN COMPARISON:  None Available. FINDINGS: Cardiovascular: No filling defects in the pulmonary arteries to suggest pulmonary emboli. Heart is normal size. Aorta is normal caliber. Scattered coronary artery and aortic calcifications. Mediastinum/Nodes: No mediastinal, hilar, or axillary adenopathy. Trachea and esophagus are unremarkable. Thyroid unremarkable. Lungs/Pleura: No confluent opacities or effusions. Upper Abdomen: Layering gallstones within the gallbladder. Musculoskeletal: Chest wall soft  tissues are unremarkable. No acute bony abnormality. Review of the MIP images confirms the above findings. IMPRESSION: No evidence of pulmonary embolus. No acute cardiopulmonary disease. Coronary artery disease. Cholelithiasis. Aortic Atherosclerosis (ICD10-I70.0). Electronically Signed   By: Rolm Baptise M.D.   On: 08/19/2022 20:54    Procedures Procedures    Medications Ordered in ED Medications  sodium chloride 0.9 % bolus  1,000 mL (0 mLs Intravenous Stopped 08/19/22 2019)  acetaminophen (TYLENOL) tablet 650 mg (650 mg Oral Given 08/19/22 1846)  morphine (PF) 4 MG/ML injection 4 mg (4 mg Intravenous Given 08/19/22 1846)  iohexol (OMNIPAQUE) 350 MG/ML injection 75 mL (75 mLs Intravenous Contrast Given 08/19/22 2051)  morphine (PF) 4 MG/ML injection 4 mg (4 mg Intravenous Given 08/19/22 2012)    ED Course/ Medical Decision Making/ A&P                             Medical Decision Making Amount and/or Complexity of Data Reviewed Labs: ordered. Radiology: ordered.  Risk OTC drugs. Prescription drug management. Decision regarding hospitalization.    SHANG ADMIRE is a 72 y.o. male with a past medical history significant for rheumatoid arthritis, known right bundle branch block, previous small bowel obstruction, hypertension, hyperlipidemia, depression, and ocular migraines who presents with 1 day of chills, fatigue, generalized weakness, diffuse muscle soreness and diffuse joint aches, shortness of breath, and near syncope.  Patient reports that a family ember has had viral flulike illness recently and patient started having symptoms today.  Patient reports multiple episodes of near syncope and feeling short of breath.  Patient denies any palpitations or chest pain.  Patient denies any constipation, diarrhea, or urinary changes.  Denies any new leg pain or leg swelling.  Reports diffuse body soreness and bodyaches and pain in all his joints.  He does not report focal headache neck pain or  neck stiffness initially.  Reports that earlier when he felt like he was short of breath and going to pass out that he thought he "was not going to make it".  Denies history of recent blood clots.  Does not take blood thinners.  Reports he has an MRI scheduled for Monday for ongoing memory troubles that is not new or different today.  On arrival, patient is found to be tachycardic, tachypneic, and was near febrile failure it is warm to touch on exam.  On exam lungs had some mild coarseness but otherwise chest and back were nontender.  Abdomen was nontender on my exam.  Bowel sounds were appreciated.  Abdomen nontender nondistended.  Patient had intact pulses in extremities.  EKG treated right bundle branch block as he has had in the past but did show some ST changes.  Clinically I suspect he has a viral illness such as influenza with the diffuse aching and exam findings.  We will swab him for viral illness but also get x-ray to look for pneumonia, get workup to make sure he is not having a near syncope from a cardiac cause, and will also get a D-dimer given the tachycardia shortness of breath, and the near syncope.  Oxygen saturations briefly went to the 80s but then improved.  Will continue to monitor and make sure he does not have persistent hypoxia and need oxygen.  If patient does become hypoxic or symptoms worsen, patient may need admission after workup is completed.  With the dry mouth and tachycardia, will give some fluids.  10:49 PM Dimer was elevated, CT PE study was ordered and did not show pulmonary embolism.  Not having abdominal pain but there were some gallstones.  CBC and CMP reassuring and CK is only elevated at 453.  Due to patient's shivering and diffuse soreness he may have a small degree of rhabdo.  Viral testing negative for COVID/flu/RSV.  Urinalysis returned without evidence of UTI but lactic  acid is rising now up to 3.2.  He has received some fluids and BNP is not elevated.   Troponin normal.  Lipase normal.  Magnesium normal.  Patient is not hypoxic with oxygen saturation around 84% on room air.  It is unclear what is causing his hypoxia however with the hypoxia, shortness of breath, feeling of impending doom that he was going to die today, despite otherwise negative workup do not feel he is safe for discharge home.    Will call for admission for new hypoxia and suspected viral illness.  As his urinalysis and CT did not show clear evidence of a bacterial infection, will talk with medicine about antibiotics or not.  He is not having headache, neck pain, neck stiffness and have low suspicion for acute meningitis at this time.  Will call for admission.        Final Clinical Impression(s) / ED Diagnoses Final diagnoses:  Fever, unspecified fever cause  Shortness of breath  Near syncope  Hypoxia    Clinical Impression: 1. Fever, unspecified fever cause   2. Shortness of breath   3. Near syncope   4. Hypoxia     Disposition: Admit  This note was prepared with assistance of Dragon voice recognition software. Occasional wrong-word or sound-a-like substitutions may have occurred due to the inherent limitations of voice recognition software.      Zion Lint, Gwenyth Allegra, MD 08/19/22 [phone]

## 2022-08-19 NOTE — ED Triage Notes (Addendum)
Pt reports sudden onset weakness (general), dizziness, ears ringing, pain to all joints at 1330; gait unsteady to triage; denies CP, now c/o [facility]

## 2022-08-19 NOTE — ED Notes (Signed)
Patient appears restless.  Attempted to reposition in bed.  Patient's family member states that "he normally sleeps in weird positions."

## 2022-08-19 NOTE — ED Notes (Signed)
ED Provider at bedside. 

## 2022-08-19 NOTE — ED Notes (Signed)
Verbal order received to draw 2nd lactate acid and one set of blood culture.  Instructed to hold blood cultures at this time.

## 2022-08-20 DIAGNOSIS — E7800 Pure hypercholesterolemia, unspecified: Secondary | ICD-10-CM | POA: Diagnosis not present

## 2022-08-20 DIAGNOSIS — Z6841 Body Mass Index (BMI) 40.0 and over, adult: Secondary | ICD-10-CM | POA: Diagnosis not present

## 2022-08-20 DIAGNOSIS — E669 Obesity, unspecified: Secondary | ICD-10-CM | POA: Diagnosis not present

## 2022-08-20 DIAGNOSIS — Z85820 Personal history of malignant melanoma of skin: Secondary | ICD-10-CM | POA: Diagnosis not present

## 2022-08-20 DIAGNOSIS — M0609 Rheumatoid arthritis without rheumatoid factor, multiple sites: Secondary | ICD-10-CM | POA: Diagnosis not present

## 2022-08-20 DIAGNOSIS — I2510 Atherosclerotic heart disease of native coronary artery without angina pectoris: Secondary | ICD-10-CM | POA: Diagnosis not present

## 2022-08-20 DIAGNOSIS — R0602 Shortness of breath: Secondary | ICD-10-CM | POA: Diagnosis present

## 2022-08-20 DIAGNOSIS — A419 Sepsis, unspecified organism: Secondary | ICD-10-CM | POA: Diagnosis present

## 2022-08-20 DIAGNOSIS — D72810 Lymphocytopenia: Secondary | ICD-10-CM | POA: Diagnosis not present

## 2022-08-20 DIAGNOSIS — G2581 Restless legs syndrome: Secondary | ICD-10-CM | POA: Diagnosis not present

## 2022-08-20 DIAGNOSIS — I4510 Unspecified right bundle-branch block: Secondary | ICD-10-CM | POA: Diagnosis not present

## 2022-08-20 DIAGNOSIS — K8020 Calculus of gallbladder without cholecystitis without obstruction: Secondary | ICD-10-CM | POA: Diagnosis not present

## 2022-08-20 DIAGNOSIS — G4733 Obstructive sleep apnea (adult) (pediatric): Secondary | ICD-10-CM | POA: Diagnosis not present

## 2022-08-20 DIAGNOSIS — R0902 Hypoxemia: Secondary | ICD-10-CM | POA: Diagnosis not present

## 2022-08-20 DIAGNOSIS — E876 Hypokalemia: Secondary | ICD-10-CM | POA: Diagnosis not present

## 2022-08-20 DIAGNOSIS — M1990 Unspecified osteoarthritis, unspecified site: Secondary | ICD-10-CM | POA: Diagnosis not present

## 2022-08-20 DIAGNOSIS — I10 Essential (primary) hypertension: Secondary | ICD-10-CM

## 2022-08-20 DIAGNOSIS — M069 Rheumatoid arthritis, unspecified: Secondary | ICD-10-CM | POA: Diagnosis not present

## 2022-08-20 DIAGNOSIS — Z8249 Family history of ischemic heart disease and other diseases of the circulatory system: Secondary | ICD-10-CM | POA: Diagnosis not present

## 2022-08-20 DIAGNOSIS — Z96653 Presence of artificial knee joint, bilateral: Secondary | ICD-10-CM | POA: Diagnosis not present

## 2022-08-20 DIAGNOSIS — G4700 Insomnia, unspecified: Secondary | ICD-10-CM | POA: Diagnosis not present

## 2022-08-20 DIAGNOSIS — Z9884 Bariatric surgery status: Secondary | ICD-10-CM | POA: Diagnosis not present

## 2022-08-20 DIAGNOSIS — Z1152 Encounter for screening for COVID-19: Secondary | ICD-10-CM | POA: Diagnosis not present

## 2022-08-20 DIAGNOSIS — Z87891 Personal history of nicotine dependence: Secondary | ICD-10-CM | POA: Diagnosis not present

## 2022-08-20 DIAGNOSIS — N400 Enlarged prostate without lower urinary tract symptoms: Secondary | ICD-10-CM | POA: Diagnosis not present

## 2022-08-20 DIAGNOSIS — I700 Atherosclerosis of aorta: Secondary | ICD-10-CM | POA: Diagnosis not present

## 2022-08-20 LAB — PROTIME-INR
INR: 1.1 (ref 0.8–1.2)
Prothrombin Time: 13.7 seconds (ref 11.4–15.2)

## 2022-08-20 LAB — RESPIRATORY PANEL BY PCR

## 2022-08-20 LAB — CBC
HCT: 42.6 % (ref 39.0–52.0)
Hemoglobin: 14.8 g/dL (ref 13.0–17.0)
MCH: 31.6 pg (ref 26.0–34.0)
MCHC: 34.7 g/dL (ref 30.0–36.0)
MCV: 90.8 fL (ref 80.0–100.0)
Platelets: 227 10*3/uL (ref 150–400)
RBC: 4.69 MIL/uL (ref 4.22–5.81)
RDW: 15.3 % (ref 11.5–15.5)
WBC: 5.6 10*3/uL (ref 4.0–10.5)
nRBC: 0 % (ref 0.0–0.2)

## 2022-08-20 LAB — COMPREHENSIVE METABOLIC PANEL
ALT: 25 U/L (ref 0–44)
AST: 31 U/L (ref 15–41)
Albumin: 3.1 g/dL — ABNORMAL LOW (ref 3.5–5.0)
Alkaline Phosphatase: 52 U/L (ref 38–126)
Anion gap: 11 (ref 5–15)
BUN: 17 mg/dL (ref 8–23)
CO2: 22 mmol/L (ref 22–32)
Calcium: 8.1 mg/dL — ABNORMAL LOW (ref 8.9–10.3)
Chloride: 101 mmol/L (ref 98–111)
Creatinine, Ser: 1.04 mg/dL (ref 0.61–1.24)
GFR, Estimated: >60 mL/min (ref >60)
Glucose, Bld: 108 mg/dL — ABNORMAL HIGH (ref 70–99)
Potassium: 3.1 mmol/L — ABNORMAL LOW (ref 3.5–5.1)
Sodium: 134 mmol/L — ABNORMAL LOW (ref 135–145)
Total Bilirubin: 1.2 mg/dL (ref 0.3–1.2)
Total Protein: 5.9 g/dL — ABNORMAL LOW (ref 6.5–8.1)

## 2022-08-20 LAB — LACTIC ACID, PLASMA: Lactic Acid, Venous: 1.3 mmol/L (ref 0.5–1.9)

## 2022-08-20 LAB — C-REACTIVE PROTEIN: CRP: 12.1 mg/dL — ABNORMAL HIGH (ref <1.0)

## 2022-08-20 LAB — SEDIMENTATION RATE: Sed Rate: 5 mm/hr (ref 0–16)

## 2022-08-20 LAB — CORTISOL-AM, BLOOD: Cortisol - AM: 9.3 ug/dL (ref 6.7–22.6)

## 2022-08-20 LAB — PROCALCITONIN: Procalcitonin: 1.2 ng/mL

## 2022-08-20 LAB — HIV ANTIBODY (ROUTINE TESTING W REFLEX): HIV Screen 4th Generation wRfx: NONREACTIVE

## 2022-08-20 LAB — CK: Total CK: 453 U/L — ABNORMAL HIGH (ref 49–397)

## 2022-08-20 MED ORDER — ONDANSETRON HCL 4 MG/2ML IJ SOLN
4.0000 mg | Freq: Four times a day (QID) | INTRAMUSCULAR | Status: DC | PRN
  Administered 2022-08-21: 4 mg via INTRAVENOUS
  Filled 2022-08-20: qty 2

## 2022-08-20 MED ORDER — PANTOPRAZOLE SODIUM 40 MG PO TBEC
40.0000 mg | DELAYED_RELEASE_TABLET | Freq: Every day | ORAL | Status: DC
  Administered 2022-08-20 – 2022-08-21 (×2): 40 mg via ORAL
  Filled 2022-08-20 (×2): qty 1

## 2022-08-20 MED ORDER — TRAZODONE HCL 50 MG PO TABS
50.0000 mg | ORAL_TABLET | Freq: Every day | ORAL | Status: DC
  Administered 2022-08-20: 50 mg via ORAL
  Filled 2022-08-20: qty 1

## 2022-08-20 MED ORDER — TIZANIDINE HCL 4 MG PO TABS
4.0000 mg | ORAL_TABLET | Freq: Two times a day (BID) | ORAL | Status: DC | PRN
  Administered 2022-08-20: 4 mg via ORAL
  Filled 2022-08-20: qty 1

## 2022-08-20 MED ORDER — VANCOMYCIN HCL 2000 MG/400ML IV SOLN
2000.0000 mg | Freq: Once | INTRAVENOUS | Status: AC
  Administered 2022-08-20: 2000 mg via INTRAVENOUS
  Filled 2022-08-20: qty 400

## 2022-08-20 MED ORDER — VANCOMYCIN HCL IN DEXTROSE 1-5 GM/200ML-% IV SOLN: 1000.0000 mg | INTRAVENOUS | Status: AC

## 2022-08-20 MED ORDER — ONDANSETRON HCL 4 MG PO TABS: 4.0000 mg | ORAL_TABLET | Freq: Four times a day (QID) | ORAL | Status: DC | PRN

## 2022-08-20 MED ORDER — DULOXETINE HCL 60 MG PO CPEP
60.0000 mg | ORAL_CAPSULE | Freq: Every day | ORAL | Status: DC
  Administered 2022-08-20 – 2022-08-21 (×2): 60 mg via ORAL
  Filled 2022-08-20 (×2): qty 1

## 2022-08-20 MED ORDER — TRAZODONE HCL 50 MG PO TABS: 25.0000 mg | ORAL_TABLET | Freq: Every evening | ORAL | Status: DC | PRN

## 2022-08-20 MED ORDER — SODIUM CHLORIDE 0.9 % IV SOLN
2.0000 g | Freq: Three times a day (TID) | INTRAVENOUS | Status: DC
  Administered 2022-08-20 – 2022-08-21 (×4): 2 g via INTRAVENOUS
  Filled 2022-08-20 (×4): qty 12.5

## 2022-08-20 MED ORDER — KETOROLAC TROMETHAMINE 15 MG/ML IJ SOLN
15.0000 mg | Freq: Four times a day (QID) | INTRAMUSCULAR | Status: DC | PRN
  Administered 2022-08-20 (×2): 15 mg via INTRAVENOUS
  Filled 2022-08-20 (×3): qty 1

## 2022-08-20 MED ORDER — METRONIDAZOLE 500 MG/100ML IV SOLN
500.0000 mg | Freq: Once | INTRAVENOUS | Status: AC
  Administered 2022-08-20: 500 mg via INTRAVENOUS
  Filled 2022-08-20: qty 100

## 2022-08-20 MED ORDER — POTASSIUM CHLORIDE CRYS ER 20 MEQ PO TBCR
40.0000 meq | EXTENDED_RELEASE_TABLET | Freq: Once | ORAL | Status: AC
  Administered 2022-08-20: 40 meq via ORAL
  Filled 2022-08-20: qty 2

## 2022-08-20 MED ORDER — SIMVASTATIN 20 MG PO TABS
40.0000 mg | ORAL_TABLET | Freq: Every day | ORAL | Status: DC
  Administered 2022-08-20 – 2022-08-21 (×2): 40 mg via ORAL
  Filled 2022-08-20 (×2): qty 2

## 2022-08-20 MED ORDER — ROPINIROLE HCL 1 MG PO TABS: 4.0000 mg | ORAL_TABLET | Freq: Once | ORAL | Status: DC

## 2022-08-20 MED ORDER — ORAL CARE MOUTH RINSE: 15.0000 mL | OROMUCOSAL | Status: DC | PRN

## 2022-08-20 MED ORDER — VANCOMYCIN HCL 1500 MG/300ML IV SOLN
1500.0000 mg | INTRAVENOUS | Status: DC
  Administered 2022-08-20: 1500 mg via INTRAVENOUS
  Filled 2022-08-20: qty 300

## 2022-08-20 MED ORDER — GABAPENTIN 300 MG PO CAPS
600.0000 mg | ORAL_CAPSULE | Freq: Four times a day (QID) | ORAL | Status: DC
  Administered 2022-08-20 – 2022-08-21 (×3): 600 mg via ORAL
  Filled 2022-08-20 (×3): qty 2

## 2022-08-20 MED ORDER — MORPHINE SULFATE (PF) 2 MG/ML IV SOLN
2.0000 mg | INTRAVENOUS | Status: DC | PRN
  Administered 2022-08-20: 2 mg via INTRAVENOUS
  Filled 2022-08-20: qty 1

## 2022-08-20 MED ORDER — SODIUM CHLORIDE 0.9 % IV SOLN
2.0000 g | Freq: Once | INTRAVENOUS | Status: AC
  Administered 2022-08-20: 2 g via INTRAVENOUS
  Filled 2022-08-20: qty 12.5

## 2022-08-20 MED ORDER — ACETAMINOPHEN 650 MG RE SUPP: 650.0000 mg | Freq: Four times a day (QID) | RECTAL | Status: DC | PRN

## 2022-08-20 MED ORDER — TAMSULOSIN HCL 0.4 MG PO CAPS
0.4000 mg | ORAL_CAPSULE | Freq: Every day | ORAL | Status: DC
  Administered 2022-08-20 – 2022-08-21 (×2): 0.4 mg via ORAL
  Filled 2022-08-20 (×2): qty 1

## 2022-08-20 MED ORDER — LACTATED RINGERS IV SOLN
150.0000 mL/h | INTRAVENOUS | Status: AC
  Administered 2022-08-20 (×3): 150 mL/h via INTRAVENOUS

## 2022-08-20 MED ORDER — ACETAMINOPHEN 325 MG PO TABS
650.0000 mg | ORAL_TABLET | Freq: Four times a day (QID) | ORAL | Status: DC | PRN
  Administered 2022-08-20 (×3): 650 mg via ORAL
  Filled 2022-08-20 (×4): qty 2

## 2022-08-20 MED ORDER — METRONIDAZOLE 500 MG/100ML IV SOLN
500.0000 mg | Freq: Two times a day (BID) | INTRAVENOUS | Status: DC
  Administered 2022-08-20 – 2022-08-21 (×3): 500 mg via INTRAVENOUS
  Filled 2022-08-20 (×3): qty 100

## 2022-08-20 MED ORDER — ROPINIROLE HCL 1 MG PO TABS
2.0000 mg | ORAL_TABLET | Freq: Two times a day (BID) | ORAL | Status: AC
  Administered 2022-08-20 (×2): 2 mg via ORAL
  Filled 2022-08-20 (×2): qty 2

## 2022-08-20 MED ORDER — ROPINIROLE HCL 1 MG PO TABS: 4.0000 mg | ORAL_TABLET | Freq: Every evening | ORAL | Status: DC

## 2022-08-20 MED ORDER — ENOXAPARIN SODIUM 40 MG/0.4ML IJ SOSY
40.0000 mg | PREFILLED_SYRINGE | INTRAMUSCULAR | Status: DC
  Administered 2022-08-20: 40 mg via SUBCUTANEOUS
  Filled 2022-08-20: qty 0.4

## 2022-08-20 MED ORDER — OMEPRAZOLE MAGNESIUM 20 MG PO TBEC: 20.0000 mg | DELAYED_RELEASE_TABLET | Freq: Every day | ORAL | Status: DC

## 2022-08-20 MED ORDER — METOPROLOL SUCCINATE ER 25 MG PO TB24
25.0000 mg | ORAL_TABLET | Freq: Every day | ORAL | Status: DC
  Administered 2022-08-20 – 2022-08-21 (×2): 25 mg via ORAL
  Filled 2022-08-20 (×2): qty 1

## 2022-08-20 NOTE — Progress Notes (Signed)
[facility] PROGRESS NOTE  David [name] (DOB: [birth year]) [id number] PCP: Sheran Spine, FNP  Brief Narrative: David [name] is a 72 y.o. male with a history of RA on methotrexate, hydroxychloroquine, sulfasalazine who presented to the ED on 08/19/2022 with generalized weakness, fatigue, dizziness, fever/chills, and diffuse muscle aches. His granddaughter lives nearby and had flulike illness first, then his wife a few days prior to this episode developed symptoms. In the ED, temperature 103.54F, tachycardic, tachypneic. WBC 8.1 with lymphocyte count of 200. Lactic acid 3.2 > 1.3 with IV fluids. D-dimer was 0.92, subsequent CTA chest showed no PE or other acute abnormality. No WBCs on UA.   Subjective: Feels much better, still generally weak with some muscle aches without localized symptoms. Having severe restless legs since he didn't get ropinirole.   Objective: BP (!) 143/85 (BP Location: Left Arm)   Pulse 77   Temp 97.7 F (36.5 C) (Oral)   Resp 18   Ht 5\' 8"  (1.727 m)   Wt 118.4 kg   SpO2 92%   BMI 39.69 kg/m   Gen: No distress Pulm: Clear, nonlabored  CV: RRR, no MRG or edema GI: Soft, NT, ND, +BS  Neuro: Alert and oriented. No new focal deficits. Ext: Warm, no deformities, has minimally tender bilateral olecranon bursae, point tenderness at left forearm proximal muscle insertions. No focal visible or palpable inflammation Skin: No new rashes, lesions or ulcers on visualized skin. Has more chronic abrasion on left knee and callus of right knee due to standing on knees often because it's more comfortable than bending over due to disc disease. No midline spinal tenderness  TSH normal at 3.920 Cortisol AM is normal at 9.3.  SARS-CoV-2, flu, RSV PCR negative HIV NR  Assessment & Plan: Sepsis in immunosuppressed patient: Lymphopenia, lack of focality of symptoms, and sick contacts suggest viral syndrome.   CRP 12.1 but ESR normal at 5. Procalcitonin positive at  1.20.  - Had headache without neck pain or stiffness which has resolved and has no meningitic signs on exam at all.  - Continue broad antibiotic coverage given immunosuppressed status.  - Check RVP - Send blood cultures - If source remains unclear, may need to scan and/or enlist ID assistance.   RA: Has also had work up for polymyositis which was negative per pt.  - Hold MTX, sulfasalazine, and HCQ for today - Continue duloxetine, celecoxib, gabapentin  Hypokalemia:  - Supplement  OSA, hypoxia: SpO2 dips when resting and not on CPAP.  - CPAP while resting.  HLD:  - Reorder home statin  BPH:  - Reorder home tamsulosin  RLS:  - Restart ropinirole  Insomnia:  - Continue trazodone qHS  HTN:  - Continue metoprolol to avoid rebound tachycardia, hold HCTZ since not severely hypertensive.  Obesity: Body mass index is 39.69 kg/m.   Patrecia Pour, MD [facility] [email or web address] 08/20/2022, 3:57 PM

## 2022-08-20 NOTE — Progress Notes (Signed)
Plan of Care Note for accepted transfer   Patient: David [name] MRN: [id number]   DOA: 08/19/2022  Facility requesting transfer: [facility]. Requesting Provider: Dr. Florina Ou Reason for transfer: Suspected sepsis due to undetermined organism. Facility course: David [name] is a 72 y.o. male with a past medical history significant for rheumatoid arthritis, known right bundle branch block, previous small bowel obstruction, hypertension, hyperlipidemia, depression, and ocular migraines who presents with 1 day of chills, fatigue, generalized weakness, diffuse muscle soreness and diffuse joint aches, shortness of breath, and near syncope.  Patient reports that a family ember has had viral flulike illness recently and patient started having symptoms today.  Patient reports multiple episodes of near syncope and feeling short of breath.  Patient denies any palpitations or chest pain.  Patient denies any constipation, diarrhea, or urinary changes.  Denies any new leg pain or leg swelling.  Reports diffuse body soreness and bodyaches and pain in all his joints.  He does not report focal headache neck pain or neck stiffness initially.  Reports that earlier when he felt like he was short of breath and going to pass out that he thought he "was not going to make it".  Denies history of recent blood clots.  Does not take blood thinners.  Reports he has an MRI scheduled for Monday for ongoing memory troubles that is not new or different today.  Upon presentation to the emergency room, BP was 142/87 with heart rate of 106 and respiratory rate of 20 and later 21.  Heart rate later on was 111 and respiratory rate 24.  Pulse oximetry.  Temperature was 103.2 and after Tylenol 100.6.  Pulse oximetry was 92 to 94% on 2 L of O2 by nasal cannula.  Labs revealed unremarkable CMP.  CK was 453 and BNP 32.7, high sensitive troponin I 506.  Lactic acid was 1.6 and later 3.2.  CBC was within normal.  D-dimer was 0.92.  VBG showed pH 7.394  with HCO3 of 23.4.  Procalcitonin is pending.   Chest CTA showed no evidence for PE with no acute cardiopulmonary disease.  It showed coronary artery disease and cholelithiasis as well as aortic atherosclerosis.   EKG showed sinus tachycardia with a rate of 107 and right bundle branch block.  The patient was given 4 mg of IV morphine sulfate, 1 L bolus of IV normal saline, 650 mg p.o. Tylenol and I recommended broad-spectrum antibiotic coverage with IV vancomycin, cefepime and Flagyl.  Plan of care:The patient is accepted for admission to Telemetry unit, at [facility]   The patient will be under the care and responsibility of the ER physician until he arrives to [facility].  Author: Christel Mormon, MD 08/20/2022  Check [email or web address] for on-call coverage.  Nursing staff, Please call [place] number on Amion as soon as patient's arrival, so appropriate admitting provider can evaluate the pt.

## 2022-08-20 NOTE — ED Notes (Signed)
Family updated on plan of care.  Patient continues to pull off monitoring equipment.  Has urinated on chair in room while attempting to use urinal.  Patient states he is ready to leave.  Reminded of importance of staying for admission.

## 2022-08-20 NOTE — Progress Notes (Addendum)
Patient asked this RN what are the process if ever he wants to go home. RN educated him and emphasized the importance of staying in the hospital since MD trying to figure where his sepsis coming from. Daughter at the bedside and talk with the patient as well.

## 2022-08-20 NOTE — Assessment & Plan Note (Addendum)
Sepsis, unknown source. Bacterial vs viral infection? Sepsis pathway Got empiric cefepime, flagyl, vanc in ED will continue these for now Procalcitonin POSITIVE at 1.2 Lactate trended up initially but now improved after IVF Tele monitor Has headache now, but no meningismus. Will leave patient on droplet precautions BCx pending UA neg CT chest neg RVP pending COVID, FLU, RSV = negative Consider ID consult in AM

## 2022-08-20 NOTE — ED Notes (Signed)
Report given to 3E RN.  

## 2022-08-20 NOTE — ED Notes (Signed)
[facility] called for transport. 

## 2022-08-20 NOTE — Assessment & Plan Note (Signed)
Hold home BP meds in setting of possible early sepsis.

## 2022-08-20 NOTE — ED Notes (Signed)
Patient transported to [facility] via [facility] at this time. Attempted report to [facility] [place].  Phone was not answered.  Will attempt report again shortly

## 2022-08-20 NOTE — ED Provider Notes (Signed)
12:25 AM Dr. Sidney Ace accepts for admission to the hospitalist service.  He would like [place] to start broad-spectrum antibiotics (cefepime, vancomycin, Flagyl) for suspected sepsis.     Siaosi Alter, Jenny Reichmann, MD 08/20/22 [phone]

## 2022-08-20 NOTE — ED Notes (Addendum)
2nd attempt made to give report to RN on 3 E.  No answer from floor at this time

## 2022-08-20 NOTE — Assessment & Plan Note (Signed)
Med rec pending. Looks like he takes MTX and plaquenil (immunosuppressed).

## 2022-08-20 NOTE — H&P (Signed)
History and Physical    Patient: David [name] [id number] DOB: [birth year] DOA: 08/19/2022 DOS: the patient was seen and examined on 08/20/2022 PCP: Stamey, Girtha Rm, FNP  Patient coming from: Home  Chief Complaint:  Chief Complaint  Patient presents with   Dizziness   HPI: David [name] is a 72 y.o. male with medical history significant of RA on immunosuppressives, RBBB, prior SBO.  Pt in to ED with 1 day h/o fever, chills, fatigue, generalized weakness, SOB, diffuse muscle aches that are severe.  Patient reports that a family ember has had viral flulike illness recently (primarily nausea and vomiting) and patient started having symptoms today.  Initially no headache nor meningismus.  But now has headache but no meningismus.  Reports he has an MRI scheduled for Monday for ongoing memory troubles that is not new or different today.  Denies rash, skin changes, groin pain.  Denies diarrhea, constipation.  ED course: Upon presentation to the emergency room, BP was 142/87 with heart rate of 106 and respiratory rate of 20 and later 21.  Heart rate later on was 111 and respiratory rate 24.  Pulse oximetry.  Temperature was 103.2 and after Tylenol 100.6.  Pulse oximetry was 92 to 94% on 2 L of O2 by nasal cannula.   The patient was given 4 mg of IV morphine sulfate, 1 L bolus of IV normal saline, 650 mg p.o. Tylenol and I recommended broad-spectrum antibiotic coverage with IV vancomycin, cefepime and Flagyl.   Review of Systems: As mentioned in the history of present illness. All other systems reviewed and are negative. 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    RLS (restless legs syndrome)    Sleep apnea    could not use a cpap   Snores    Status post gastric banding    Wears glasses    driving   Past Surgical History:  Procedure Laterality Date   ANTERIOR CERVICAL DECOMPRESSION/DISCECTOMY FUSION 4 LEVELS N/A 02/26/2020   Procedure: ANTERIOR CERVICAL DECOMPRESSION/DISCECTOMY FUSION, INTERBODY PROSTHESIS, PLATE/SCREWS CERVICAL THREE-FOUR CERVICAL FOUR-FIVE CERVICAL FIVE-SIX- CERVICAL SIX-SEVEN;  Surgeon: Newman Pies, MD;  Location: [facility];  Service: Neurosurgery;  Laterality: N/A;   CARPAL TUNNEL RELEASE Right 09/26/2012   Procedure: CARPAL TUNNEL RELEASE;  Surgeon: Tennis Must, MD;  Location: [facility];  Service: Orthopedics;  Laterality: Right;   CARPAL TUNNEL RELEASE Left 10/24/2012   Procedure: CARPAL TUNNEL RELEASE;  Surgeon: Tennis Must, MD;  Location: [facility];  Service: Orthopedics;  Laterality: Left;   EXCISION MELANOMA WITH SENTINEL LYMPH NODE BIOPSY  1984   rt neck with mulpipal nodes and some muscle excised rt neck-shoulder   KNEE ARTHROPLASTY     knee replacement  LAPAROSCOPIC GASTRIC BANDING  03/15/2009   LAPAROSCOPIC GASTRIC BANDING     MUSCLE BIOPSY  2004   rt thigh to r/o myocitis   nerve conduction velosity test     SHOULDER SURGERY Right 12/2021   TONSILLECTOMY     TOTAL KNEE ARTHROPLASTY Bilateral 12/05/2010   TRIGGER FINGER RELEASE Right 09/26/2012   Procedure: RELEASE TRIGGER FINGER/A-1 PULLEY RING AND SMALL ;  Surgeon: Tennis Must, MD;  Location: [facility];  Service: Orthopedics;  Laterality: Right;   TRIGGER FINGER RELEASE Left 10/24/2012    Procedure: RELEASE TRIGGER FINGER/A-1 PULLEY LEFT MIDDLE AND LEFT RING;  Surgeon: Tennis Must, MD;  Location: [facility];  Service: Orthopedics;  Laterality: Left;   UPPER GI ENDOSCOPY     Social History:  reports that he quit smoking about 20 years ago. His smoking use included cigarettes. He has a 30.00 pack-year smoking history. He has never been exposed to tobacco smoke. He has never used smokeless tobacco. He reports that he does not currently use alcohol. He reports that he does not use drugs.  Allergies  Allergen Reactions   Codeine Itching   Dilaudid [Hydromorphone Hcl] Itching   Fentanyl Itching    Pt states Fentanyl patch causes itching   Nucynta [Tapentadol] Other (See Comments)    MENTAL PROBLEMS   Penicillins Itching    Has patient had a PCN reaction causing immediate rash, facial/tongue/throat swelling, SOB or lightheadedness with hypotension:unsure childhood reaction Has patient had a PCN reaction causing severe rash involving mucus membranes or skin necrosis:unsure Has patient had a PCN reaction that required hospitalization:No Has patient had a PCN reaction occurring within the last 10 years:NO If all of the above answers are "NO", then may proceed with Cephalosporin use.     Family History  Problem Relation Age of Onset   Heart disease Mother    Alzheimer's disease Father     Prior to Admission medications   Medication Sig Start Date End Date Taking? Authorizing Provider  celecoxib (CELEBREX) 200 MG capsule TAKE 1 CAPSULE BY MOUTH TWICE A DAY BETWEEN MEALS AS NEEDED 07/14/22   Mcarthur Rossetti, MD  DULoxetine (CYMBALTA) 60 MG capsule Take 60 mg by mouth 2 (two) times daily.     [provider]  folic acid (FOLVITE) 1 MG tablet Take 2 tablets (2 mg total) by mouth daily. 02/08/22   Bo Merino, MD  gabapentin (NEURONTIN) 300 MG capsule TAKE THREE CAPSULES BY MOUTH THREE TIMES A DAY Patient taking differently: Take 600 mg by mouth  2 (two) times daily. 04/05/20   Mcarthur Rossetti, MD  hydrochlorothiazide (HYDRODIURIL) 25 MG tablet Take 25 mg by mouth daily.    [provider]  HYDROcodone-acetaminophen (NORCO/VICODIN) 5-325 MG tablet Take 1-2 tablets by mouth every 4 (four) hours as needed for moderate pain or severe pain. 05/17/22   Molpus, John, MD  hydroxychloroquine (PLAQUENIL) 200 MG tablet Take 1 tablet (200 mg total) by mouth 2 (two) times daily. 07/20/22   Ofilia Neas, PA-C  methocarbamol (ROBAXIN) 750 MG tablet Take 1 tablet (750 mg total) by mouth every 8 (eight) hours as needed for muscle spasms. 02/08/22   Mcarthur Rossetti, MD  methotrexate 50 MG/2ML injection INJECT 1ML UNDER THE SKIN ONCE WEEKLY 07/20/22   Ofilia Neas, PA-C  metoprolol succinate (TOPROL-XL) 25 MG 24 hr tablet Take 25 mg by mouth daily. 11/24/14   [provider]  omeprazole (PRILOSEC OTC) 20 MG tablet Take  20 mg by mouth daily.    [provider]  ondansetron (ZOFRAN-ODT) 8 MG disintegrating tablet Take 1 tablet (8 mg total) by mouth every 8 (eight) hours as needed for nausea or vomiting. 05/17/22   Molpus, John, MD  pantoprazole (PROTONIX) 40 MG tablet Take 40 mg by mouth daily. 08/24/21   [provider]  rOPINIRole (REQUIP) 4 MG tablet Take 4 mg by mouth every evening.     [provider]  simvastatin (ZOCOR) 40 MG tablet Take 40 mg by mouth daily.    [provider]  sulfaSALAzine (AZULFIDINE) 500 MG tablet TAKE 2 TABLETS BY MOUTH TWICE A DAY 06/20/22   Ofilia Neas, PA-C  tamsulosin (FLOMAX) 0.4 MG CAPS capsule Take 0.4 mg by mouth daily.     [provider]  tiZANidine (ZANAFLEX) 4 MG tablet Take 1 tablet (4 mg total) by mouth every 8 (eight) hours as needed for muscle spasms. 03/10/22   Mcarthur Rossetti, MD  traZODone (DESYREL) 50 MG tablet Take 50 mg by mouth at bedtime.    [provider]  Tuberculin-Allergy Syringes (BD ALLERGIST TRAY) 27G X 1/2"  1 ML KIT USE TO INJECT METHOTREXATE ONCE WEEKLY 01/26/22   Bo Merino, MD  Tuberculin-Allergy Syringes 27G X 1/2" 1 ML MISC Use to inject methotrexate once weekly. 02/08/22   Bo Merino, MD  UNABLE TO FIND daily. Med Name: Aspirin-Tylenol-Caffeine    [provider]    Physical Exam: Vitals:   08/19/22 2330 08/20/22 0000 08/20/22 0100 08/20/22 0300  BP: 118/88 134/87 126/67   Pulse: (!) 102 (!) 103 88   Resp: 19 18 17 17   Temp:  (!) 100.6 F (38.1 C) 98.1 F (36.7 C)   TempSrc:  Oral Oral   SpO2: 94% 94% 94%   Weight:   118.4 kg   Height:   5\' 8"  (1.727 m)    Constitutional: NAD, calm, comfortable Respiratory: clear to auscultation bilaterally, no wheezing, no crackles. Normal respiratory effort. No accessory muscle use.  Cardiovascular: Regular rate and rhythm, no murmurs / rubs / gallops. No extremity edema. 2+ pedal pulses. No carotid bruits.  Abdomen: no tenderness, no masses palpated. No hepatosplenomegaly. Bowel sounds positive.  Neurologic: CN 2-12 grossly intact. Sensation intact, DTR normal. Strength 5/5 in all 4.  Psychiatric: Normal judgment and insight. Alert and oriented x 3. Normal mood.   Data Reviewed:     CBC    Component Value Date/Time   WBC 8.1 08/19/2022 1755   RBC 5.20 08/19/2022 1755   HGB 15.6 08/19/2022 2300   HCT 46.0 08/19/2022 2300   PLT 264 08/19/2022 1755   MCV 93.7 08/19/2022 1755   MCH 31.0 08/19/2022 1755   MCHC 33.1 08/19/2022 1755   RDW 15.0 08/19/2022 1755   LYMPHSABS 0.2 (L) 08/19/2022 1755   MONOABS 0.4 08/19/2022 1755   EOSABS 0.1 08/19/2022 1755   BASOSABS 0.0 08/19/2022 1755   CMP     Component Value Date/Time   NA 139 08/19/2022 2300   NA 138 03/03/2016 0000   K 3.9 08/19/2022 2300   CL 100 08/19/2022 1755   CO2 28 08/19/2022 1755   GLUCOSE 113 (H) 08/19/2022 1755   BUN 22 08/19/2022 1755   BUN 18 03/03/2016 0000   CREATININE 1.10 08/19/2022 1755   CREATININE 1.04 07/20/2022 0927   CALCIUM 9.3  08/19/2022 1755   PROT 7.5 08/19/2022 1755   ALBUMIN 4.1 08/19/2022 1755   AST 40 08/19/2022 1755  ALT 33 08/19/2022 1755   ALKPHOS 74 08/19/2022 1755   BILITOT 0.7 08/19/2022 1755   GFRNONAA >60 08/19/2022 1755   GFRNONAA 79 11/12/2020 1107   GFRAA 91 11/12/2020 1107   CTA chest: IMPRESSION: No evidence of pulmonary embolus.   No acute cardiopulmonary disease.   Coronary artery disease.   Cholelithiasis.   Aortic Atherosclerosis (ICD10-I70.0).  Procalcitonin 1.2  Lactate 1.6 -> 3.2 -> 1.3  Trop neg x2  COVID, FLU, RSV neg  RVP pending  Assessment and Plan: * Sepsis due to undetermined organism ([place]) Sepsis, unknown source. Bacterial vs viral infection? Sepsis pathway Got empiric cefepime, flagyl, vanc in ED will continue these for now Procalcitonin POSITIVE at 1.2 Lactate trended up initially but now improved after IVF Tele monitor Has headache now, but no meningismus. Will leave patient on droplet precautions BCx pending UA neg CT chest neg RVP pending COVID, FLU, RSV = negative Consider ID consult in AM  HTN (hypertension) Hold home BP meds in setting of possible early sepsis.  Rheumatoid arthritis ([place]) Med rec pending. Looks like he takes MTX and plaquenil (immunosuppressed).      Advance Care Planning:   Code Status: Full Code  Consults: None, consider ID consult in AM  Family Communication: Daughter at bedside  Severity of Illness: The appropriate patient status for this patient is INPATIENT. Inpatient status is judged to be reasonable and necessary in order to provide the required intensity of service to ensure the patient's safety. The patient's presenting symptoms, physical exam findings, and initial radiographic and laboratory data in the context of their chronic comorbidities is felt to place them at high risk for further clinical deterioration. Furthermore, it is not anticipated that the patient will be medically stable for discharge  from the hospital within 2 midnights of admission.   * I certify that at the point of admission it is my clinical judgment that the patient will require inpatient hospital care spanning beyond 2 midnights from the point of admission due to high intensity of service, high risk for further deterioration and high frequency of surveillance required.*  Author: Etta Quill., DO 08/20/2022 3:07 AM  For on call review [email or web address].

## 2022-08-20 NOTE — Progress Notes (Signed)
Patient complain of headache, 6/10 throbbing pain. This RN give his PRN tylenol. After few minutes, patient asking for medicine to help him fall asleep and another pain medicine for his right arm 10/10. MD notified.

## 2022-08-20 NOTE — Progress Notes (Signed)
Pharmacy Antibiotic Note  David [name] is a 72 y.o. male admitted on 08/19/2022 with sepsis.  Pharmacy has been consulted for vancomycin and cefepime dosing.  Plan: Cefepime 2 G IV q8 hours Vancomcyin 2G IV x1 then 1500mg  IV q24 hours (eAUC 465)  Height: 5\' 8"  (172.7 cm) Weight: 118.4 kg (261 lb 0.4 oz) IBW/kg (Calculated) : 68.4  Temp (24hrs), Avg:100.6 F (38.1 C), Min:98.1 F (36.7 C), Max:103.2 F (39.6 C)  Recent Labs  Lab 08/19/22 1755 08/19/22 1812 08/19/22 2159 08/20/22 0128  WBC 8.1  --   --   --   CREATININE 1.10  --   --   --   LATICACIDVEN  --  1.6 3.2* 1.3    Estimated Creatinine Clearance: 78.1 mL/min (by C-G formula based on SCr of 1.1 mg/dL).    Allergies  Allergen Reactions   Codeine Itching   Dilaudid [Hydromorphone Hcl] Itching   Fentanyl Itching    Pt states Fentanyl patch causes itching   Nucynta [Tapentadol] Other (See Comments)    MENTAL PROBLEMS   Penicillins Itching    Has patient had a PCN reaction causing immediate rash, facial/tongue/throat swelling, SOB or lightheadedness with hypotension:unsure childhood reaction Has patient had a PCN reaction causing severe rash involving mucus membranes or skin necrosis:unsure Has patient had a PCN reaction that required hospitalization:No Has patient had a PCN reaction occurring within the last 10 years:NO If all of the above answers are "NO", then may proceed with Cephalosporin use.     Thank you for allowing pharmacy to be a part of this patient's care.  Jodean Lima Henya Aguallo 08/20/2022 2:57 AM

## 2022-08-21 ENCOUNTER — Other Ambulatory Visit: Payer: Medicare Other

## 2022-08-21 ENCOUNTER — Other Ambulatory Visit (HOSPITAL_COMMUNITY): Payer: Self-pay

## 2022-08-21 DIAGNOSIS — I4510 Unspecified right bundle-branch block: Secondary | ICD-10-CM | POA: Diagnosis not present

## 2022-08-21 DIAGNOSIS — Z87891 Personal history of nicotine dependence: Secondary | ICD-10-CM | POA: Diagnosis not present

## 2022-08-21 DIAGNOSIS — G4733 Obstructive sleep apnea (adult) (pediatric): Secondary | ICD-10-CM | POA: Diagnosis not present

## 2022-08-21 DIAGNOSIS — I2510 Atherosclerotic heart disease of native coronary artery without angina pectoris: Secondary | ICD-10-CM | POA: Diagnosis not present

## 2022-08-21 DIAGNOSIS — Z8249 Family history of ischemic heart disease and other diseases of the circulatory system: Secondary | ICD-10-CM | POA: Diagnosis not present

## 2022-08-21 DIAGNOSIS — Z1152 Encounter for screening for COVID-19: Secondary | ICD-10-CM | POA: Diagnosis not present

## 2022-08-21 DIAGNOSIS — Z96653 Presence of artificial knee joint, bilateral: Secondary | ICD-10-CM | POA: Diagnosis not present

## 2022-08-21 DIAGNOSIS — D72810 Lymphocytopenia: Secondary | ICD-10-CM | POA: Diagnosis not present

## 2022-08-21 DIAGNOSIS — E876 Hypokalemia: Secondary | ICD-10-CM | POA: Diagnosis not present

## 2022-08-21 DIAGNOSIS — E669 Obesity, unspecified: Secondary | ICD-10-CM | POA: Diagnosis not present

## 2022-08-21 DIAGNOSIS — M069 Rheumatoid arthritis, unspecified: Secondary | ICD-10-CM | POA: Diagnosis not present

## 2022-08-21 DIAGNOSIS — R0602 Shortness of breath: Secondary | ICD-10-CM | POA: Diagnosis present

## 2022-08-21 DIAGNOSIS — I700 Atherosclerosis of aorta: Secondary | ICD-10-CM | POA: Diagnosis not present

## 2022-08-21 DIAGNOSIS — Z85820 Personal history of malignant melanoma of skin: Secondary | ICD-10-CM | POA: Diagnosis not present

## 2022-08-21 DIAGNOSIS — A419 Sepsis, unspecified organism: Secondary | ICD-10-CM | POA: Diagnosis not present

## 2022-08-21 DIAGNOSIS — N400 Enlarged prostate without lower urinary tract symptoms: Secondary | ICD-10-CM | POA: Diagnosis not present

## 2022-08-21 DIAGNOSIS — Z9884 Bariatric surgery status: Secondary | ICD-10-CM | POA: Diagnosis not present

## 2022-08-21 DIAGNOSIS — K8020 Calculus of gallbladder without cholecystitis without obstruction: Secondary | ICD-10-CM | POA: Diagnosis not present

## 2022-08-21 DIAGNOSIS — G2581 Restless legs syndrome: Secondary | ICD-10-CM | POA: Diagnosis not present

## 2022-08-21 DIAGNOSIS — E7800 Pure hypercholesterolemia, unspecified: Secondary | ICD-10-CM | POA: Diagnosis not present

## 2022-08-21 DIAGNOSIS — Z6841 Body Mass Index (BMI) 40.0 and over, adult: Secondary | ICD-10-CM | POA: Diagnosis not present

## 2022-08-21 DIAGNOSIS — G4700 Insomnia, unspecified: Secondary | ICD-10-CM | POA: Diagnosis not present

## 2022-08-21 DIAGNOSIS — M1990 Unspecified osteoarthritis, unspecified site: Secondary | ICD-10-CM | POA: Diagnosis not present

## 2022-08-21 DIAGNOSIS — I10 Essential (primary) hypertension: Secondary | ICD-10-CM | POA: Diagnosis not present

## 2022-08-21 DIAGNOSIS — R0902 Hypoxemia: Secondary | ICD-10-CM | POA: Diagnosis not present

## 2022-08-21 LAB — URINE CULTURE: Culture: <10000 — AB

## 2022-08-21 LAB — BASIC METABOLIC PANEL
Anion gap: 6 (ref 5–15)
BUN: 11 mg/dL (ref 8–23)
CO2: 23 mmol/L (ref 22–32)
Calcium: 8.1 mg/dL — ABNORMAL LOW (ref 8.9–10.3)
Chloride: 103 mmol/L (ref 98–111)
Creatinine, Ser: 1.02 mg/dL (ref 0.61–1.24)
GFR, Estimated: >60 mL/min (ref >60)
Glucose, Bld: 121 mg/dL — ABNORMAL HIGH (ref 70–99)
Potassium: 3.2 mmol/L — ABNORMAL LOW (ref 3.5–5.1)
Sodium: 132 mmol/L — ABNORMAL LOW (ref 135–145)

## 2022-08-21 LAB — PROCALCITONIN: Procalcitonin: 0.2 ng/mL

## 2022-08-21 MED ORDER — PROCHLORPERAZINE EDISYLATE 10 MG/2ML IJ SOLN
5.0000 mg | INTRAMUSCULAR | Status: AC
  Administered 2022-08-21: 5 mg via INTRAVENOUS
  Filled 2022-08-21: qty 2

## 2022-08-21 MED ORDER — DOXYCYCLINE MONOHYDRATE 100 MG PO CAPS: 100.0000 mg | ORAL_CAPSULE | Freq: Two times a day (BID) | ORAL | 0 refills | Status: DC

## 2022-08-21 MED ORDER — ONDANSETRON 8 MG PO TBDP: 8.0000 mg | ORAL_TABLET | Freq: Three times a day (TID) | ORAL | 0 refills | Status: DC | PRN

## 2022-08-21 MED ORDER — POTASSIUM CHLORIDE CRYS ER 20 MEQ PO TBCR
40.0000 meq | EXTENDED_RELEASE_TABLET | Freq: Two times a day (BID) | ORAL | Status: AC
  Administered 2022-08-21 (×2): 40 meq via ORAL
  Filled 2022-08-21 (×2): qty 2

## 2022-08-21 MED ORDER — CEFDINIR 300 MG PO CAPS: 300.0000 mg | ORAL_CAPSULE | Freq: Two times a day (BID) | ORAL | 0 refills | Status: DC

## 2022-08-21 NOTE — Progress Notes (Signed)
Patient discharging home with wife. Vital signs stable at time of discharge as reflected in discharge summary. Discharge instructions given and verbal understanding returned. No questions at this time. 

## 2022-08-21 NOTE — Discharge Summary (Signed)
Physician Discharge Summary   Patient: David [name] MRN: [id number] DOB: [birth year]  Admit date:     08/19/2022  Discharge date: 08/21/22  Discharge Physician: David [name]   PCP: David Spine, FNP   Recommendations at discharge:  Follow up with PCP in the next week.  Follow up blood cultures (NGTD at time of discharge) for full 5 days.  Follow up with rheumatology per routine, note holding MTX x1 week in setting of illness.   Discharge Diagnoses: Principal Problem:   Sepsis due to undetermined organism Active Problems:   Rheumatoid arthritis   HTN (hypertension)  Hospital Course: [name] is a 72 y.o. male with a history of RA on methotrexate, hydroxychloroquine, sulfasalazine who presented to the ED on 08/19/2022 with generalized weakness, fatigue, dizziness, fever/chills, and diffuse muscle aches. His granddaughter lives nearby and had flulike illness first, then his wife a few days prior to this episode developed symptoms. In the ED, temperature 103.47F, tachycardic, tachypneic. WBC 8.1 with lymphocyte count of 200. Lactic acid 3.2 > 1.3 with IV fluids. D-dimer was 0.92, subsequent CTA chest showed no PE or other acute abnormality. No WBCs on UA. Empiric antibiotic therapy was started and the patient was admitted, though has had no further fever or focal nidus of infection. He has grown quite anxious and depressed, tearful at times, and both he and his spouse request discharge today. They are reliable and understand return precautions. He is discharged in very stable condition with plans to complete a course of empiric antibiotic therapy and hold immunosuppressive medications temporarily.   Assessment and Plan: Sepsis in immunosuppressed patient: Lymphopenia without leukocytosis, lack of focality of symptoms, and sick contacts suggest viral syndrome.  Covid, flu, RSV, and full viral panel was negative when checked.  CRP 12.1 but ESR normal at 5. Procalcitonin 1.2 > 0.2 in <  24hrs.  - Had headache without neck pain or stiffness which has resolved and has no meningitic signs on exam at all. No urinary or respiratory symptoms and no cutaneous infections on exam.  - Blood cultures are NGTD and will be monitored for 5 days.  - The patient is having severe anxiety and depressive symptoms which he has had when hospitalized in the past. He physically feels well, has felt well for > 24 hours and both he and his spouse request discharge today. He is at high risk for decompensation if there is untreated bacterial infection, so will discharge to complete 5 days of doxycycline, omnicef (PCN allergic, improved on vancomycin, cefepime) empirically.    RA: Has also had work up for polymyositis which was negative per pt.  - Hold MTX, sulfasalazine, and HCQ until 4/3 at which time blood cultures would either have grown something and the patient would be contacted by myself or would be negative.  - Continue duloxetine, celecoxib, gabapentin   Hypokalemia:  - Supplemented   OSA, hypoxia: SpO2 dips when resting and not on CPAP.  - CPAP recommended. No hypoxia on exertion or otherwise.   HLD:  - Reorder home statin   BPH:  - Reorder home tamsulosin   RLS:  - Restart ropinirole   Insomnia:  - Continue trazodone qHS   HTN:  - Continue metoprolol to avoid rebound tachycardia, hold HCTZ since not severely hypertensive.   Obesity: Body mass index is 39.69 kg/m.   Consultants: None Procedures performed: None  Disposition: Home Diet recommendation:  Cardiac diet DISCHARGE MEDICATION: Allergies as of 08/21/2022  Reactions   Codeine Itching   Dilaudid [hydromorphone Hcl] Itching   Fentanyl Itching   Pt states Fentanyl patch causes itching   Nucynta [tapentadol] Other (See Comments)   MENTAL PROBLEMS   Penicillins Itching   Has patient had a PCN reaction causing immediate rash, facial/tongue/throat swelling, SOB or lightheadedness with hypotension:unsure childhood  reaction Has patient had a PCN reaction causing severe rash involving mucus membranes or skin necrosis:unsure Has patient had a PCN reaction that required hospitalization:No Has patient had a PCN reaction occurring within the last 10 years:NO If all of the above answers are "NO", then may proceed with Cephalosporin use.        Medication List     STOP taking these medications    HYDROcodone-acetaminophen 5-325 MG tablet Commonly known as: NORCO/VICODIN   pantoprazole 40 MG tablet Commonly known as: PROTONIX       TAKE these medications    BD Allergist Tray 27G X 1/2" 1 ML Kit Generic drug: Tuberculin-Allergy Syringes USE TO INJECT METHOTREXATE ONCE WEEKLY   Tuberculin-Allergy Syringes 27G X 1/2" 1 ML Misc Use to inject methotrexate once weekly.   cefdinir 300 MG capsule Commonly known as: OMNICEF Take 1 capsule (300 mg total) by mouth 2 (two) times daily.   celecoxib 200 MG capsule Commonly known as: CELEBREX TAKE 1 CAPSULE BY MOUTH TWICE A DAY BETWEEN MEALS AS NEEDED   doxycycline 100 MG capsule Commonly known as: MONODOX Take 1 capsule (100 mg total) by mouth 2 (two) times daily.   DULoxetine 60 MG capsule Commonly known as: CYMBALTA Take 60 mg by mouth 2 (two) times daily.   folic acid 1 MG tablet Commonly known as: FOLVITE Take 2 tablets (2 mg total) by mouth daily.   gabapentin 300 MG capsule Commonly known as: NEURONTIN TAKE THREE CAPSULES BY MOUTH THREE TIMES A DAY What changed: See the new instructions.   hydrochlorothiazide 25 MG tablet Commonly known as: HYDRODIURIL Take 25 mg by mouth daily.   hydroxychloroquine 200 MG tablet Commonly known as: PLAQUENIL Take 1 tablet (200 mg total) by mouth 2 (two) times daily.   methocarbamol 750 MG tablet Commonly known as: ROBAXIN Take 1 tablet (750 mg total) by mouth every 8 (eight) hours as needed for muscle spasms.   methotrexate 50 MG/2ML injection INJECT 1ML UNDER THE SKIN ONCE WEEKLY    metoprolol succinate 25 MG 24 hr tablet Commonly known as: TOPROL-XL Take 25 mg by mouth daily.   omeprazole 20 MG tablet Commonly known as: PRILOSEC OTC Take 20 mg by mouth daily.   ondansetron 8 MG disintegrating tablet Commonly known as: ZOFRAN-ODT Take 1 tablet (8 mg total) by mouth every 8 (eight) hours as needed for nausea or vomiting.   rOPINIRole 4 MG tablet Commonly known as: REQUIP Take 4 mg by mouth every evening.   simvastatin 40 MG tablet Commonly known as: ZOCOR Take 40 mg by mouth daily.   sulfaSALAzine 500 MG tablet Commonly known as: AZULFIDINE TAKE 2 TABLETS BY MOUTH TWICE A DAY   tamsulosin 0.4 MG Caps capsule Commonly known as: FLOMAX Take 0.4 mg by mouth daily.   tiZANidine 4 MG tablet Commonly known as: Zanaflex Take 1 tablet (4 mg total) by mouth every 8 (eight) hours as needed for muscle spasms. What changed: when to take this   traZODone 50 MG tablet Commonly known as: DESYREL Take 50 mg by mouth at bedtime.   UNABLE TO FIND daily. Med Name: Aspirin-Tylenol-Caffeine  Follow-up Information     Stamey, Girtha Rm, FNP Follow up.   Specialty: Family Medicine Contact information: [place] [address] [phone]         Bo Merino, MD Follow up.   Specialty: Rheumatology Contact information: [place] [address] [phone]                Discharge Exam: Danley Danker Weights   08/19/22 1741 08/20/22 0100 08/21/22 0450  Weight: 120.2 kg 118.4 kg 120.2 kg  BP 137/79   Pulse 89   Temp 98.5 F (36.9 C) (Oral)   Resp 20   Ht 5\' 8"  (1.727 m)   Wt 120.2 kg   SpO2 94%   BMI 40.31 kg/m   Obese, well-appearing male in no distress No nuchal stiffness or rigidity or painful ROM. CNs intact. Nonlabored, clear RRR, no MRG or pitting edema +BS, soft, NT, ND No rashes or cellulitis or abscess on visualized skin Alert, oriented, anxious appearing.  Condition at  discharge: stable  The results of significant diagnostics from this hospitalization (including imaging, microbiology, ancillary and laboratory) are listed below for reference.   Imaging Studies: CT Angio Chest PE W and/or Wo Contrast  Result Date: 08/19/2022 CLINICAL DATA:  Pulmonary embolism (PE) suspected, low to intermediate prob, positive D-dimer EXAM: CT ANGIOGRAPHY CHEST WITH CONTRAST TECHNIQUE: Multidetector CT imaging of the chest was performed using the standard protocol during bolus administration of intravenous contrast. Multiplanar CT image reconstructions and MIPs were obtained to evaluate the vascular anatomy. RADIATION DOSE REDUCTION: This exam was performed according to the departmental dose-optimization program which includes automated exposure control, adjustment of the mA and/or kV according to patient size and/or use of iterative reconstruction technique. CONTRAST:  34mL OMNIPAQUE IOHEXOL 350 MG/ML SOLN COMPARISON:  None Available. FINDINGS: Cardiovascular: No filling defects in the pulmonary arteries to suggest pulmonary emboli. Heart is normal size. Aorta is normal caliber. Scattered coronary artery and aortic calcifications. Mediastinum/Nodes: No mediastinal, hilar, or axillary adenopathy. Trachea and esophagus are unremarkable. Thyroid unremarkable. Lungs/Pleura: No confluent opacities or effusions. Upper Abdomen: Layering gallstones within the gallbladder. Musculoskeletal: Chest wall soft tissues are unremarkable. No acute bony abnormality. Review of the MIP images confirms the above findings. IMPRESSION: No evidence of pulmonary embolus. No acute cardiopulmonary disease. Coronary artery disease. Cholelithiasis. Aortic Atherosclerosis (ICD10-I70.0). Electronically Signed   By: Rolm Baptise M.D.   On: 08/19/2022 20:54    Microbiology: Results for orders placed or performed during the hospital encounter of 08/19/22  Resp panel by RT-PCR (RSV, Flu A&B, Covid) Anterior Nasal Swab      Status: None   Collection Time: 08/19/22  6:12 PM   Specimen: Anterior Nasal Swab  Result Value Ref Range Status   SARS Coronavirus 2 by RT PCR NEGATIVE NEGATIVE Final    Comment: (NOTE) SARS-CoV-2 target nucleic acids are NOT DETECTED.  The SARS-CoV-2 RNA is generally detectable in upper respiratory specimens during the acute phase of infection. The lowest concentration of SARS-CoV-2 viral copies this assay can detect is 138 copies/mL. A negative result does not preclude SARS-Cov-2 infection and should not be used as the sole basis for treatment or other patient management decisions. A negative result may occur with  improper specimen collection/handling, submission of specimen other than nasopharyngeal swab, presence of viral mutation(s) within the areas targeted by this assay, and inadequate number of viral copies(<138 copies/mL). A negative result must be combined with clinical observations, patient history, and epidemiological  information. The expected result is Negative.  Fact Sheet for Patients:  EntrepreneurPulse.com.au  Fact Sheet for Healthcare Providers:  IncredibleEmployment.be  This test is no t yet approved or cleared by the [place] FDA and  has been authorized for detection and/or diagnosis of SARS-CoV-2 by FDA under an Emergency Use Authorization (EUA). This EUA will remain  in effect (meaning this test can be used) for the duration of the COVID-19 declaration under Section 564(b)(1) of the Act, 21 U.S.C.section 360bbb-3(b)(1), unless the authorization is terminated  or revoked sooner.       Influenza A by PCR NEGATIVE NEGATIVE Final   Influenza B by PCR NEGATIVE NEGATIVE Final    Comment: (NOTE) The Xpert Xpress SARS-CoV-2/FLU/RSV plus assay is intended as an aid in the diagnosis of influenza from Nasopharyngeal swab specimens and should not be used as a sole basis for treatment. Nasal washings and aspirates are  unacceptable for Xpert Xpress SARS-CoV-2/FLU/RSV testing.  Fact Sheet for Patients: EntrepreneurPulse.com.au  Fact Sheet for Healthcare Providers: IncredibleEmployment.be  This test is not yet approved or cleared by the [place] FDA and has been authorized for detection and/or diagnosis of SARS-CoV-2 by FDA under an Emergency Use Authorization (EUA). This EUA will remain in effect (meaning this test can be used) for the duration of the COVID-19 declaration under Section 564(b)(1) of the Act, 21 U.S.C. section 360bbb-3(b)(1), unless the authorization is terminated or revoked.     Resp Syncytial Virus by PCR NEGATIVE NEGATIVE Final    Comment: (NOTE) Fact Sheet for Patients: EntrepreneurPulse.com.au  Fact Sheet for Healthcare Providers: IncredibleEmployment.be  This test is not yet approved or cleared by the [place] FDA and has been authorized for detection and/or diagnosis of SARS-CoV-2 by FDA under an Emergency Use Authorization (EUA). This EUA will remain in effect (meaning this test can be used) for the duration of the COVID-19 declaration under Section 564(b)(1) of the Act, 21 U.S.C. section 360bbb-3(b)(1), unless the authorization is terminated or revoked.  Performed at [facility], [address] [place], [place] [address]   Respiratory (~20 pathogens) panel by PCR     Status: None   Collection Time: 08/20/22  1:28 AM   Specimen: Nasopharyngeal Swab; Respiratory  Result Value Ref Range Status   Adenovirus NOT DETECTED NOT DETECTED Final   Coronavirus 229E NOT DETECTED NOT DETECTED Final    Comment: (NOTE) The Coronavirus on the Respiratory Panel, DOES NOT test for the novel  Coronavirus (2019 nCoV)    Coronavirus HKU1 NOT DETECTED NOT DETECTED Final   Coronavirus NL63 NOT DETECTED NOT DETECTED Final   Coronavirus OC43 NOT DETECTED NOT DETECTED Final   Metapneumovirus  NOT DETECTED NOT DETECTED Final   Rhinovirus / Enterovirus NOT DETECTED NOT DETECTED Final   Influenza A NOT DETECTED NOT DETECTED Final   Influenza B NOT DETECTED NOT DETECTED Final   Parainfluenza Virus 1 NOT DETECTED NOT DETECTED Final   Parainfluenza Virus 2 NOT DETECTED NOT DETECTED Final   Parainfluenza Virus 3 NOT DETECTED NOT DETECTED Final   Parainfluenza Virus 4 NOT DETECTED NOT DETECTED Final   Respiratory Syncytial Virus NOT DETECTED NOT DETECTED Final   Bordetella pertussis NOT DETECTED NOT DETECTED Final   Bordetella Parapertussis NOT DETECTED NOT DETECTED Final   Chlamydophila pneumoniae NOT DETECTED NOT DETECTED Final   Mycoplasma pneumoniae NOT DETECTED NOT DETECTED Final    Comment: Performed at [facility], [place]. [address], [place], [place] [address]  Culture, blood (Routine  X 2) w Reflex to ID Panel     Status: None (Preliminary result)   Collection Time: 08/20/22  1:03 PM   Specimen: BLOOD  Result Value Ref Range Status   Specimen Description BLOOD SITE NOT SPECIFIED  Final   Special Requests   Final    BOTTLES DRAWN AEROBIC ONLY Blood Culture adequate volume   Culture   Final    NO GROWTH < 24 HOURS Performed at [facility], [address] [place], [place] [address]    Report Status PENDING  Incomplete  Culture, blood (Routine X 2) w Reflex to ID Panel     Status: None (Preliminary result)   Collection Time: 08/20/22  3:18 PM   Specimen: BLOOD RIGHT HAND  Result Value Ref Range Status   Specimen Description BLOOD RIGHT HAND  Final   Special Requests   Final    BOTTLES DRAWN AEROBIC AND ANAEROBIC Blood Culture adequate volume   Culture   Final    NO GROWTH < 24 HOURS Performed at [facility], [place] [address], [place], [place] [address]    Report Status PENDING  Incomplete    Labs: CBC: Recent Labs  Lab 08/19/22 1755 08/19/22 2300 08/20/22 0608  WBC 8.1  --  5.6  NEUTROABS 7.4  --   --   HGB 16.1 15.6 14.8  HCT 48.7 46.0 42.6   MCV 93.7  --  90.8  PLT 264  --  [id number]   Basic Metabolic Panel: Recent Labs  Lab 08/19/22 1755 08/19/22 2300 08/20/22 0608 08/21/22 0034  NA 138 139 134* 132*  K 4.1 3.9 3.1* 3.2*  CL 100  --  101 103  CO2 28  --  22 23  GLUCOSE 113*  --  108* 121*  BUN 22  --  17 11  CREATININE 1.10  --  1.04 1.02  CALCIUM 9.3  --  8.1* 8.1*  MG 1.9  --   --   --    Liver Function Tests: Recent Labs  Lab 08/19/22 1755 08/20/22 0608  AST 40 31  ALT 33 25  ALKPHOS 74 52  BILITOT 0.7 1.2  PROT 7.5 5.9*  ALBUMIN 4.1 3.1*   CBG: No results for input(s): "GLUCAP" in the last 168 hours.  Discharge time spent: greater than 30 minutes.  Signed: Patrecia Pour, MD [facility] 08/21/2022

## 2022-08-22 DIAGNOSIS — F325 Major depressive disorder, single episode, in full remission: Secondary | ICD-10-CM | POA: Diagnosis not present

## 2022-08-22 DIAGNOSIS — Z6841 Body Mass Index (BMI) 40.0 and over, adult: Secondary | ICD-10-CM | POA: Diagnosis not present

## 2022-08-22 DIAGNOSIS — F419 Anxiety disorder, unspecified: Secondary | ICD-10-CM | POA: Diagnosis not present

## 2022-08-22 DIAGNOSIS — A419 Sepsis, unspecified organism: Secondary | ICD-10-CM | POA: Diagnosis not present

## 2022-08-25 LAB — CULTURE, BLOOD (ROUTINE X 2)
Culture: NO GROWTH
Culture: NO GROWTH
Special Requests: ADEQUATE
Special Requests: ADEQUATE

## 2022-09-07 DIAGNOSIS — K2270 Barrett's esophagus without dysplasia: Secondary | ICD-10-CM | POA: Diagnosis not present

## 2022-09-07 DIAGNOSIS — K219 Gastro-esophageal reflux disease without esophagitis: Secondary | ICD-10-CM | POA: Diagnosis not present

## 2022-09-14 DIAGNOSIS — L821 Other seborrheic keratosis: Secondary | ICD-10-CM | POA: Diagnosis not present

## 2022-09-14 DIAGNOSIS — Z85828 Personal history of other malignant neoplasm of skin: Secondary | ICD-10-CM | POA: Diagnosis not present

## 2022-09-14 DIAGNOSIS — C44619 Basal cell carcinoma of skin of left upper limb, including shoulder: Secondary | ICD-10-CM | POA: Diagnosis not present

## 2022-09-14 DIAGNOSIS — L814 Other melanin hyperpigmentation: Secondary | ICD-10-CM | POA: Diagnosis not present

## 2022-09-14 DIAGNOSIS — C44519 Basal cell carcinoma of skin of other part of trunk: Secondary | ICD-10-CM | POA: Diagnosis not present

## 2022-09-14 DIAGNOSIS — Z85820 Personal history of malignant melanoma of skin: Secondary | ICD-10-CM | POA: Diagnosis not present

## 2022-09-14 DIAGNOSIS — D045 Carcinoma in situ of skin of trunk: Secondary | ICD-10-CM | POA: Diagnosis not present

## 2022-09-17 ENCOUNTER — Other Ambulatory Visit: Payer: Self-pay | Admitting: Orthopaedic Surgery

## 2022-09-18 ENCOUNTER — Other Ambulatory Visit: Payer: Self-pay | Admitting: *Deleted

## 2022-09-18 MED ORDER — SULFASALAZINE 500 MG PO TABS: 1000.0000 mg | ORAL_TABLET | Freq: Two times a day (BID) | ORAL | 0 refills | Status: DC

## 2022-09-18 NOTE — Telephone Encounter (Signed)
He is scheduled at [facility] on May 11. [facility] - auth: [id number] exp. 09/18/22-10/17/22

## 2022-09-18 NOTE — Telephone Encounter (Signed)
Last Fill: 06/20/2022  Labs: 08/21/2022 BMP Sodium 132, Potassium 3.2, Glucose 121, Calcium 8.1, 08/20/2022 CBC normal  Next Visit: 12/27/2022  Last Visit: 07/20/2022  DX: Rheumatoid arthritis of multiple sites with negative rheumatoid factor   Current Dose per office note 07/20/2022: sulfasalazine 500 mg 2 tablets po BID   Okay to refill Sulfasalazine?

## 2022-09-25 DIAGNOSIS — G4733 Obstructive sleep apnea (adult) (pediatric): Secondary | ICD-10-CM | POA: Diagnosis not present

## 2022-09-30 ENCOUNTER — Ambulatory Visit
Admission: RE | Admit: 2022-09-30 | Discharge: 2022-09-30 | Disposition: A | Discharge status: Home / Self Care | Payer: Medicare Other | Source: Ambulatory Visit | Attending: Diagnostic Neuroimaging | Admitting: Diagnostic Neuroimaging

## 2022-09-30 DIAGNOSIS — R413 Other amnesia: Secondary | ICD-10-CM

## 2022-09-30 MED ORDER — GADOPICLENOL 0.5 MMOL/ML IV SOLN
10.0000 mL | Freq: Once | INTRAVENOUS | Status: AC | PRN
  Administered 2022-09-30: 10 mL via INTRAVENOUS

## 2022-10-02 ENCOUNTER — Telehealth: Payer: Self-pay | Admitting: Diagnostic Neuroimaging

## 2022-10-02 ENCOUNTER — Encounter: Payer: Self-pay | Admitting: Neurology

## 2022-10-02 DIAGNOSIS — R9389 Abnormal findings on diagnostic imaging of other specified body structures: Secondary | ICD-10-CM

## 2022-10-02 DIAGNOSIS — G2581 Restless legs syndrome: Secondary | ICD-10-CM | POA: Diagnosis not present

## 2022-10-02 DIAGNOSIS — I10 Essential (primary) hypertension: Secondary | ICD-10-CM | POA: Diagnosis not present

## 2022-10-02 DIAGNOSIS — G4733 Obstructive sleep apnea (adult) (pediatric): Secondary | ICD-10-CM | POA: Diagnosis not present

## 2022-10-02 DIAGNOSIS — R413 Other amnesia: Secondary | ICD-10-CM

## 2022-10-02 NOTE — Telephone Encounter (Signed)
Referral fax to [facility]: Phone: [phone]  Fax: [phone]

## 2022-10-08 ENCOUNTER — Other Ambulatory Visit: Payer: Self-pay | Admitting: Physician Assistant

## 2022-10-09 NOTE — Telephone Encounter (Signed)
Last Fill: 07/20/2022  Labs: 08/20/2022 Sodium 134, Potassium 3.1, Glucose 108,Calcium 8.1, Total Protein 5.9, Albumin 3.1  Next Visit: 12/27/2022   Last Visit: 07/20/2022  DX: Rheumatoid arthritis of multiple sites with negative rheumatoid factor   Current Dose per office note 07/20/2022: Methotrexate 1.0 ml sq injections once weekly   Okay to refill Methotrexate?

## 2022-10-10 ENCOUNTER — Other Ambulatory Visit: Payer: Self-pay | Admitting: Rheumatology

## 2022-10-11 DIAGNOSIS — M7731 Calcaneal spur, right foot: Secondary | ICD-10-CM | POA: Diagnosis not present

## 2022-10-11 DIAGNOSIS — L84 Corns and callosities: Secondary | ICD-10-CM | POA: Diagnosis not present

## 2022-10-11 DIAGNOSIS — M722 Plantar fascial fibromatosis: Secondary | ICD-10-CM | POA: Diagnosis not present

## 2022-10-11 DIAGNOSIS — M79671 Pain in right foot: Secondary | ICD-10-CM | POA: Diagnosis not present

## 2022-10-11 NOTE — Telephone Encounter (Signed)
Last Fill: 01/26/2022  Next Visit: 12/27/2022  Last Visit: 07/20/2022  Dx: Rheumatoid arthritis of multiple sites with negative rheumatoid factor   Current Dose per office note on 07/20/2022: To use with injectable MTX  Okay to refill Syringes?

## 2022-10-14 ENCOUNTER — Other Ambulatory Visit: Payer: Self-pay | Admitting: Physician Assistant

## 2022-10-14 DIAGNOSIS — M0609 Rheumatoid arthritis without rheumatoid factor, multiple sites: Secondary | ICD-10-CM

## 2022-10-14 DIAGNOSIS — Z79899 Other long term (current) drug therapy: Secondary | ICD-10-CM

## 2022-10-17 ENCOUNTER — Telehealth: Payer: Self-pay

## 2022-10-17 NOTE — Telephone Encounter (Signed)
Called patient and spoke with Darl Pikes pt wife. I have informed her per Dr. Dierdre Highman "Enlarged pituitary gland noted. Recommend ophthalmology, endocrinology consults for further eval. Remainder of brain is ok. Can setup video visit next week to discuss further." Patient wife stated someone has called and schedule the Neurosurgery appointment on 10/26/22. Still waiting or Ophthalmology. Pt verbalized understanding. Pt had no questions at this time but was encouraged to call back if questions arise.

## 2022-10-17 NOTE — Telephone Encounter (Signed)
-----   Message from Suanne Marker, MD sent at 10/13/2022  9:16 PM EDT ----- Enlarged pituitary gland noted. Recommend ophthalmology, endocrinology consults for further eval. Remainder of brain is ok. Can setup video visit next week to discuss further. -VRP

## 2022-10-17 NOTE — Telephone Encounter (Signed)
Last Fill: 07/20/2022  Eye exam: 02/11/2020, per office note on 07/20/2022: Patient had an updated Plaquenil eye examination about 2 months ago at [facility] care.   Labs: 08/20/2022 sodium 134, potassium 3.1, glucose 108, calcium 8.1, total protein 5.9, albumin 3.1  Next Visit: 12/27/2022  Last Visit: 07/20/2022  [id number] arthritis of multiple sites with negative rheumatoid factor   Current Dose per office note on 07/20/2022: plaquenil 200 mg 1 tablet twice daily   I called [facility] to obtain eye exam and was informed that only a regular eye exam was performed on 05/31/2022. Patient will need to call and schedule PLQ visual field exam. I have faxed our recent office note per request of [facility]. I have called patient to advise.   Okay to refill 30 day supply of Plaquenil?

## 2022-10-23 ENCOUNTER — Telehealth: Payer: Self-pay | Admitting: *Deleted

## 2022-10-23 ENCOUNTER — Encounter: Payer: Self-pay | Admitting: Neurology

## 2022-10-23 NOTE — Telephone Encounter (Signed)
Patient's wife states she reached out to [facility] to schedule the visual field for PLQ. Darl Pikes states that she was advised to call our office to clarify what exactly Molly Maduro needs. Advised he will need a visual field and OCT for PLQ use. Advised her to call the office if she continues to have trouble and we can reach out to [facility].

## 2022-10-25 ENCOUNTER — Encounter: Payer: Self-pay | Admitting: Diagnostic Neuroimaging

## 2022-10-25 ENCOUNTER — Telehealth: Payer: Medicare Other | Admitting: Diagnostic Neuroimaging

## 2022-10-25 DIAGNOSIS — R413 Other amnesia: Secondary | ICD-10-CM

## 2022-10-25 DIAGNOSIS — R9389 Abnormal findings on diagnostic imaging of other specified body structures: Secondary | ICD-10-CM | POA: Diagnosis not present

## 2022-10-25 NOTE — Patient Instructions (Signed)
  MEMORY LOSS (since ~2023) - could be related to underlying issues of insomnia, OSA, chronic pain (rheumatoid arthritis) - daily physical activity / exercise (at least 15-30 minutes) - eat more plants / vegetables - increase social activities, brain stimulation, games, puzzles, hobbies, crafts, arts, music - aim for at least 7-8 hours sleep per night (or more) - avoid smoking and alcohol - caution with medications, finances, driving  PITUITARY GLAND LESION (likely pituitary microadenoma; suspect incidental finding) - follow up with neurosurgery and endocrinology; had visual field test later this month

## 2022-10-25 NOTE — Progress Notes (Signed)
[facility]  PATIENT: David [name] DOB: July 14, 1950  REFERRING CLINICIAN: Stamey, Verda Cumins, FNP HISTORY FROM: patient  REASON FOR VISIT: follow up   HISTORICAL  CHIEF COMPLAINT:  Chief Complaint  Patient presents with   Follow-up    HISTORY OF PRESENT ILLNESS:   UPDATE (10/25/22, VRP): Since last visit, doing well. Here to review MRI and plan. Memory is stable.   PRIOR HPI: 72 year old male here for evaluation of memory loss.  Symptoms started around 2023.  He notes some mild difficulty with recall of names, facts and mild slowing down of cognitive functioning.  However no major changes in ADLs.  Has had some issues with chronic insomnia and chronic pain issues.  He is on a variety of pain medications.  Father passed away at age 40 years old with some type of rapid progressive dementia condition, possible Creutzfeldt-Jakob disease.    REVIEW OF SYSTEMS: Full 14 system review of systems performed and negative with exception of: as per HPI.  ALLERGIES: Allergies  Allergen Reactions   Codeine Itching   Dilaudid [Hydromorphone Hcl] Itching   Fentanyl Itching    Pt states Fentanyl patch causes itching   Nucynta [Tapentadol] Other (See Comments)    MENTAL PROBLEMS   Penicillins Itching    Has patient had a PCN reaction causing immediate rash, facial/tongue/throat swelling, SOB or lightheadedness with hypotension:unsure childhood reaction Has patient had a PCN reaction causing severe rash involving mucus membranes or skin necrosis:unsure Has patient had a PCN reaction that required hospitalization:No Has patient had a PCN reaction occurring within the last 10 years:NO If all of the above answers are "NO", then may proceed with Cephalosporin use.     HOME MEDICATIONS: Outpatient Medications Prior to Visit  Medication Sig Dispense Refill   BD ALLERGIST TRAY 27G X 1/2" 1 ML KIT USE TO INJECT METHOTREXATE ONCE WEEKLY 12 kit 3   cefdinir (OMNICEF) 300 MG  capsule Take 1 capsule (300 mg total) by mouth 2 (two) times daily. 6 capsule 0   celecoxib (CELEBREX) 200 MG capsule TAKE 1 CAPSULE BY MOUTH TWICE A DAY BETWEEN MEALS AS NEEDED 60 capsule 1   doxycycline (MONODOX) 100 MG capsule Take 1 capsule (100 mg total) by mouth 2 (two) times daily. 6 capsule 0   DULoxetine (CYMBALTA) 60 MG capsule Take 60 mg by mouth 2 (two) times daily.      folic acid (FOLVITE) 1 MG tablet Take 2 tablets (2 mg total) by mouth daily. 180 tablet 3   gabapentin (NEURONTIN) 300 MG capsule TAKE THREE CAPSULES BY MOUTH THREE TIMES A DAY (Patient taking differently: Take 600 mg by mouth 4 (four) times daily.) 90 capsule 3   hydrochlorothiazide (HYDRODIURIL) 25 MG tablet Take 25 mg by mouth daily.     hydroxychloroquine (PLAQUENIL) 200 MG tablet TAKE 1 TABLET BY MOUTH TWICE A DAY 60 tablet 0   methocarbamol (ROBAXIN) 750 MG tablet Take 1 tablet (750 mg total) by mouth every 8 (eight) hours as needed for muscle spasms. (Patient not taking: Reported on 08/20/2022) 60 tablet 1   methotrexate 50 MG/2ML injection INJECT UNDER THE SKIN ONCE WEEKLY 12 mL 0   metoprolol succinate (TOPROL-XL) 25 MG 24 hr tablet Take 25 mg by mouth daily.     omeprazole (PRILOSEC OTC) 20 MG tablet Take 20 mg by mouth daily.     ondansetron (ZOFRAN-ODT) 8 MG disintegrating tablet Take 1 tablet (8 mg total) by mouth every 8 (eight) hours as needed  for nausea or vomiting. 10 tablet 0   rOPINIRole (REQUIP) 4 MG tablet Take 4 mg by mouth every evening.      simvastatin (ZOCOR) 40 MG tablet Take 40 mg by mouth daily.     sulfaSALAzine (AZULFIDINE) 500 MG tablet Take 2 tablets (1,000 mg total) by mouth 2 (two) times daily. 360 tablet 0   tamsulosin (FLOMAX) 0.4 MG CAPS capsule Take 0.4 mg by mouth daily.      tiZANidine (ZANAFLEX) 4 MG tablet Take 1 tablet (4 mg total) by mouth every 8 (eight) hours as needed for muscle spasms. (Patient taking differently: Take 4 mg by mouth 2 (two) times daily as needed for  muscle spasms.) 40 tablet 1   traZODone (DESYREL) 50 MG tablet Take 50 mg by mouth at bedtime.     Tuberculin-Allergy Syringes 27G X 1/2" 1 ML MISC Use to inject methotrexate once weekly. 12 each 3   UNABLE TO FIND daily. Med Name: Aspirin-Tylenol-Caffeine     No facility-administered medications prior to visit.    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    RLS (restless legs syndrome)    Sleep apnea    could not use a cpap   Snores    Status post gastric banding    Wears glasses    driving    PAST SURGICAL HISTORY: Past Surgical History:  Procedure Laterality Date   ANTERIOR CERVICAL DECOMPRESSION/DISCECTOMY FUSION 4 LEVELS N/A 02/26/2020   Procedure: ANTERIOR CERVICAL DECOMPRESSION/DISCECTOMY FUSION, INTERBODY PROSTHESIS, PLATE/SCREWS CERVICAL THREE-FOUR CERVICAL FOUR-FIVE CERVICAL FIVE-SIX- CERVICAL SIX-SEVEN;  Surgeon: Tressie Stalker, MD;  Location: [facility];  Service: Neurosurgery;  Laterality: N/A;   CARPAL TUNNEL RELEASE Right 09/26/2012   Procedure: CARPAL TUNNEL RELEASE;  Surgeon: Tami Ribas, MD;  Location: [facility];  Service: Orthopedics;  Laterality: Right;   CARPAL TUNNEL RELEASE Left 10/24/2012   Procedure: CARPAL TUNNEL RELEASE;  Surgeon: Tami Ribas, MD;  Location: [facility];  Service:  Orthopedics;  Laterality: Left;   EXCISION MELANOMA WITH SENTINEL LYMPH NODE BIOPSY  1984   rt neck with mulpipal nodes and some muscle excised rt neck-shoulder   KNEE ARTHROPLASTY     knee replacement     LAPAROSCOPIC GASTRIC BANDING  03/15/2009   LAPAROSCOPIC GASTRIC BANDING     MUSCLE BIOPSY  2004   rt thigh to r/o myocitis   nerve conduction velosity test     SHOULDER SURGERY Right 12/2021   TONSILLECTOMY     TOTAL KNEE ARTHROPLASTY Bilateral 12/05/2010   TRIGGER FINGER RELEASE Right 09/26/2012   Procedure: RELEASE TRIGGER FINGER/A-1 PULLEY RING AND SMALL ;  Surgeon: Tami Ribas, MD;  Location: [facility];  Service: Orthopedics;  Laterality:  Right;   TRIGGER FINGER RELEASE Left 10/24/2012   Procedure: RELEASE TRIGGER FINGER/A-1 PULLEY LEFT MIDDLE AND LEFT RING;  Surgeon: Tami Ribas, MD;  Location: [facility];  Service: Orthopedics;  Laterality: Left;   UPPER GI ENDOSCOPY      FAMILY HISTORY: Family History  Problem Relation Age of Onset   Heart disease Mother    Alzheimer's disease Father     SOCIAL HISTORY: Social History   Socioeconomic History   Marital status: Married    Spouse name: Not on file   Number of children: Not on file   Years of education: Not on file   Highest education level: Not on file  Occupational History   Not on file  Tobacco Use   Smoking status: Former    Packs/day: 1.00    Years: 30.00    Additional pack years: 0.00    Total pack years: 30.00    Types: Cigarettes    Quit date: 09/23/2001    Years since quitting: 21.1    Passive exposure: Never   Smokeless tobacco: Never  Vaping Use   Vaping Use: Never used  Substance and Sexual Activity   Alcohol use: Not Currently    Comment: rare 2-3 per year   Drug use: No   Sexual activity: Not on file  Other Topics Concern   Not on file  Social History Narrative   Not on file   Social Determinants of Health   Financial Resource Strain: Not on file  Food  Insecurity: No Food Insecurity (08/20/2022)   Hunger Vital Sign    Worried About Running Out of Food in the Last Year: Never true    Ran Out of Food in the Last Year: Never true  Transportation Needs: No Transportation Needs (08/20/2022)   PRAPARE - Administrator, Civil Service (Medical): No    Lack of Transportation (Non-Medical): No  Physical Activity: Not on file  Stress: Not on file  Social Connections: Not on file  Intimate Partner Violence: Not At Risk (08/20/2022)   Humiliation, Afraid, Rape, and Kick questionnaire    Fear of Current or Ex-Partner: No    Emotionally Abused: No    Physically Abused: No    Sexually Abused: No     PHYSICAL EXAM  GENERAL EXAM/CONSTITUTIONAL: Vitals:  There were no vitals filed for this visit.  There is no height or weight on file to calculate BMI. Wt Readings from Last 3 Encounters:  08/21/22 265 lb 1.6 oz (120.2 kg)  07/31/22 265 lb 6.4 oz (120.4 kg)  07/20/22 259 lb (117.5 kg)   Patient is in no distress; well developed, nourished and groomed; neck is supple  CARDIOVASCULAR: Examination of carotid arteries is normal; no carotid bruits Regular rate and rhythm, no murmurs Examination of peripheral vascular system by observation and palpation is normal  EYES: Ophthalmoscopic exam of optic discs and posterior segments is normal; no papilledema or hemorrhages No results found.  MUSCULOSKELETAL: Gait, strength, tone, movements noted in Neurologic exam below  NEUROLOGIC: MENTAL STATUS:     07/31/2022    8:54 AM  MMSE - Mini Mental State Exam  Orientation to time 5  Orientation to Place 5  Registration 3  Attention/ Calculation 5  Recall 3  Language- name 2 objects 2  Language- repeat 1  Language- follow 3 step command 3  Language- read & follow direction 1  Write a sentence 1  Copy design 1  Total score 30  awake, alert, oriented to person, place and time recent and remote memory intact normal attention and  concentration language fluent, comprehension intact, naming intact fund of knowledge appropriate  CRANIAL NERVE:  2nd - no papilledema on fundoscopic exam 2nd, 3rd, 4th, 6th - pupils equal and reactive to light, visual fields full to confrontation, extraocular muscles intact, no nystagmus 5th - facial sensation symmetric 7th - facial strength symmetric 8th - hearing intact 9th - palate elevates symmetrically, uvula midline 11th - shoulder shrug symmetric 12th - tongue protrusion midline  MOTOR:  normal bulk and tone, full strength in the BUE, BLE  SENSORY:  normal and symmetric to light touch, temperature, vibration  COORDINATION:  finger-nose-finger, fine finger movements normal  REFLEXES:  deep tendon reflexes present and symmetric  GAIT/STATION:  narrow based gait     DIAGNOSTIC DATA (LABS, IMAGING, TESTING) - I reviewed patient records, labs, notes, testing and imaging myself where available.  Lab Results  Component Value Date   WBC 5.6 08/20/2022   HGB 14.8 08/20/2022   HCT 42.6 08/20/2022   MCV 90.8 08/20/2022   PLT 227 08/20/2022      Component Value Date/Time   NA 132 (L) 08/21/2022 0034   NA 138 03/03/2016 0000   K 3.2 (L) 08/21/2022 0034   CL 103 08/21/2022 0034   CO2 23 08/21/2022 0034   GLUCOSE 121 (H) 08/21/2022 0034   BUN 11 08/21/2022 0034   BUN 18 03/03/2016 0000   CREATININE 1.02 08/21/2022 0034   CREATININE 1.04 07/20/2022 0927   CALCIUM 8.1 (L) 08/21/2022 0034   PROT 5.9 (L) 08/20/2022 0608   ALBUMIN 3.1 (L) 08/20/2022 0608   AST 31 08/20/2022 0608   ALT 25 08/20/2022 0608   ALKPHOS 52 08/20/2022 0608   BILITOT 1.2 08/20/2022 0608   GFRNONAA >60 08/21/2022 0034   GFRNONAA 79 11/12/2020 1107   GFRAA 91 11/12/2020 1107   No results found for: "CHOL", "HDL", "LDLCALC", "LDLDIRECT", "TRIG", "CHOLHDL" No results found for: "HGBA1C" Lab Results  Component Value Date   VITAMINB12 543 07/31/2022   Lab Results  Component Value Date    TSH 3.920 07/31/2022     07/07/17 MRI lumbar spine - Mild spinal stenosis L1-2 - Moderate spinal stenosis L2-3 with moderate subarticular stenosis bilaterally - Small central and left-sided disc protrusion with moderate spinal stenosis. Subarticular stenosis left greater than right - Mild spinal stenosis with moderate subarticular stenosis bilaterally L4-5 - Mild subarticular stenosis bilaterally L5-S1  12/04/19 MRI cervical spine  - Cervical spondylosis as outlined and most notably as follows. - At C5-C6, there is multifactorial mild/moderate spinal canal stenosis with minimal flattening of the ventral spinal cord. Severe bilateral neural foraminal narrowing. - No more than mild spinal canal stenosis at the remaining levels. Additional sites of neural foraminal narrowing as detailed and greatest on the right at C3-C4, bilaterally at C4-C5 and bilaterally at C6-C7 (severe at these sites).  09/30/22 MRI brain (with and without) demonstrating: -Enlarged pituitary gland with hypoenhancing lesion measuring 7 mm.  Superior margin of pituitary gland bows convex up without mass effect on optic chiasm.  May represent pituitary microadenoma. -Remainder of brain parenchyma is unremarkable.   ASSESSMENT AND PLAN  72 y.o. year old male here with:   Dx:  1. Memory loss   2. Abnormal MRI      PLAN:  MEMORY LOSS (since ~2023) - could be related to underlying issues of insomnia, OSA, chronic pain (rheumatoid arthritis) - daily physical activity / exercise (  at least 15-30 minutes) - eat more plants / vegetables - increase social activities, brain stimulation, games, puzzles, hobbies, crafts, arts, music - aim for at least 7-8 hours sleep per night (or more) - avoid smoking and alcohol - caution with medications, finances, driving  PITUITARY GLAND LESION (likely pituitary microadenoma; suspect incidental finding) - follow up with neurosurgery and endocrinology; had visual field test  later this month  Return for pending test results, pending if symptoms worsen or fail to improve.  Virtual Visit via Video Note  I connected with David [name] on 10/25/22 at  1:30 PM EDT by a video enabled telemedicine application and verified that I am speaking with the correct person using two identifiers.   I discussed the limitations of evaluation and management by telemedicine and the availability of in person appointments. The patient expressed understanding and agreed to proceed.  Patient is at home and I am at the office.   I spent 15 minutes of face-to-face and non-face-to-face time with patient.  This included previsit chart review, lab review, study review, order entry, electronic health record documentation, patient education.      Suanne Marker, MD 10/25/2022, 1:41 PM Certified in Neurology, Neurophysiology and Neuroimaging  [facility] [address] [place], [place] [address] [phone]

## 2022-10-26 DIAGNOSIS — R9389 Abnormal findings on diagnostic imaging of other specified body structures: Secondary | ICD-10-CM | POA: Diagnosis not present

## 2022-10-26 DIAGNOSIS — D352 Benign neoplasm of pituitary gland: Secondary | ICD-10-CM | POA: Diagnosis not present

## 2022-10-26 DIAGNOSIS — E237 Disorder of pituitary gland, unspecified: Secondary | ICD-10-CM | POA: Diagnosis not present

## 2022-10-26 NOTE — Telephone Encounter (Signed)
Any update on referral ?    Penumalli, Glenford Bayley, MD   Bobbye Morton, CMA Can you follow up on endocrinology referral? -VRP

## 2022-10-27 ENCOUNTER — Telehealth: Payer: Self-pay | Admitting: Diagnostic Neuroimaging

## 2022-10-27 NOTE — Telephone Encounter (Signed)
Noted, thanks!

## 2022-10-27 NOTE — Telephone Encounter (Signed)
Referral faxed to [facility]: Phone: [phone]  Fax: [phone]

## 2022-10-27 NOTE — Telephone Encounter (Signed)
Referral faxed to [facility]. Stated they would reach out to pt within 48 hours to schedule appointment.

## 2022-11-01 DIAGNOSIS — M7731 Calcaneal spur, right foot: Secondary | ICD-10-CM | POA: Diagnosis not present

## 2022-11-01 DIAGNOSIS — L84 Corns and callosities: Secondary | ICD-10-CM | POA: Diagnosis not present

## 2022-11-01 DIAGNOSIS — M79671 Pain in right foot: Secondary | ICD-10-CM | POA: Diagnosis not present

## 2022-11-01 DIAGNOSIS — M722 Plantar fascial fibromatosis: Secondary | ICD-10-CM | POA: Diagnosis not present

## 2022-11-10 DIAGNOSIS — Z79899 Other long term (current) drug therapy: Secondary | ICD-10-CM | POA: Diagnosis not present

## 2022-11-16 ENCOUNTER — Other Ambulatory Visit: Payer: Self-pay | Admitting: Orthopaedic Surgery

## 2022-11-22 DIAGNOSIS — M722 Plantar fascial fibromatosis: Secondary | ICD-10-CM | POA: Diagnosis not present

## 2022-11-22 DIAGNOSIS — L84 Corns and callosities: Secondary | ICD-10-CM | POA: Diagnosis not present

## 2022-11-22 DIAGNOSIS — M79671 Pain in right foot: Secondary | ICD-10-CM | POA: Diagnosis not present

## 2022-11-22 DIAGNOSIS — M7731 Calcaneal spur, right foot: Secondary | ICD-10-CM | POA: Diagnosis not present

## 2022-12-05 DIAGNOSIS — I10 Essential (primary) hypertension: Secondary | ICD-10-CM | POA: Diagnosis not present

## 2022-12-05 DIAGNOSIS — G4733 Obstructive sleep apnea (adult) (pediatric): Secondary | ICD-10-CM | POA: Diagnosis not present

## 2022-12-05 DIAGNOSIS — F5104 Psychophysiologic insomnia: Secondary | ICD-10-CM | POA: Diagnosis not present

## 2022-12-05 DIAGNOSIS — G2581 Restless legs syndrome: Secondary | ICD-10-CM | POA: Diagnosis not present

## 2022-12-07 DIAGNOSIS — I10 Essential (primary) hypertension: Secondary | ICD-10-CM | POA: Diagnosis not present

## 2022-12-07 DIAGNOSIS — E782 Mixed hyperlipidemia: Secondary | ICD-10-CM | POA: Diagnosis not present

## 2022-12-07 DIAGNOSIS — Z23 Encounter for immunization: Secondary | ICD-10-CM | POA: Diagnosis not present

## 2022-12-14 ENCOUNTER — Encounter: Payer: Self-pay | Admitting: Physician Assistant

## 2022-12-15 ENCOUNTER — Other Ambulatory Visit: Payer: Self-pay | Admitting: Physician Assistant

## 2022-12-15 NOTE — Progress Notes (Signed)
Office Visit Note  Patient: David [name]             Date of Birth: 1951/02/09           MRN: [id number]             PCP: Genia Hotter, FNP Referring: Genia Hotter, FNP Visit Date: 12/27/2022 Occupation: @GUAROCC @  Subjective:  No chief complaint on file.   History of Present Illness: David [name] is a 72 y.o. male with seronegative rheumatoid arthritis, osteoarthritis and degenerative disc disease.  Patient was hospitalized in April 2020 for for sepsis due to undetermined organism.  He was treated with antibiotics and discharged home.  All the cultures came negative.  Patient states after he went home his COVID-19 home test was positive.  He was off immunosuppressive agents for almost 2 weeks and then resume the medications.  He did not develop a flare of rheumatoid arthritis.  He states he finally recovered from his right shoulder joint surgery.  None of the joints are painful or swollen today.  Continues to have some stiffness in the morning lasting for 2 to 4 hours mostly in his hands.  He has been taking ibuprofen twice a day to relieve the pain.    Activities of Daily Living:  Patient reports morning stiffness for 2-4 hours.   Patient Denies nocturnal pain.  Difficulty dressing/grooming: Denies Difficulty climbing stairs: Denies Difficulty getting out of chair: Denies Difficulty using hands for taps, buttons, cutlery, and/or writing: Denies  Review of Systems  Constitutional:  Positive for fatigue.  HENT:  Positive for mouth dryness. Negative for mouth sores.   Eyes:  Negative for dryness.  Respiratory:  Negative for shortness of breath.   Cardiovascular:  Negative for chest pain and palpitations.  Gastrointestinal:  Negative for blood in stool, constipation and diarrhea.  Endocrine: Negative for increased urination.  Genitourinary:  Negative for involuntary urination.  Musculoskeletal:  Positive for myalgias, muscle weakness, morning stiffness, muscle  tenderness and myalgias. Negative for joint pain, gait problem, joint pain and joint swelling.  Skin:  Negative for color change, rash and sensitivity to sunlight.  Allergic/Immunologic: Negative for susceptible to infections.  Neurological:  Negative for dizziness and headaches.  Hematological:  Negative for swollen glands.  Psychiatric/Behavioral:  Positive for sleep disturbance. Negative for depressed mood. The patient is not nervous/anxious.     PMFS History:  Patient Active Problem List   Diagnosis Date Noted   Sepsis due to undetermined organism (HCC) 08/20/2022   Cervical spondylosis with radiculopathy 02/26/2020   SBO (small bowel obstruction) (HCC) 08/21/2017   Trochanteric bursitis of left hip 01/18/2017   Lateral epicondylitis, right elbow 01/18/2017   Rheumatoid arthritis (HCC) 03/15/2016   DJD (degenerative joint disease), cervical 03/15/2016   DDD (degenerative disc disease), lumbar 03/15/2016   HTN (hypertension) 03/15/2016   Obesity 03/15/2016   Elevated cholesterol 03/15/2016   Neuralgia 03/15/2016   Malignant melanoma of right side of neck (HCC) 03/15/2016   BBB (bundle branch block) 03/15/2016   High risk medication use 03/15/2016   Osteoarthritis of lumbar spine 03/15/2016   Primary osteoarthritis of both hands 03/15/2016   Primary osteoarthritis of both knees 03/15/2016   Mesenteric adenitis 06/29/2015    Past Medical History:  Diagnosis Date   Arthritis    Balance problem    BBB (bundle branch block) 03/15/2016   Carpal tunnel syndrome    DDD (degenerative disc disease), lumbar 03/15/2016   Depression  Dysrhythmia    had some tachycardia with steroids   Elevated cholesterol 03/15/2016   Headache(784.0)    Ocular Migraines - takes Ambien for it   Heart murmur    HTN (hypertension) 03/15/2016   Hyperlipemia    Hypertension    Malignant melanoma of right side of neck (HCC) 03/15/2016   30 years ago    Neuralgia 03/15/2016   Ocular migraine     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    RLS (restless legs syndrome)    Sleep apnea    could not use a cpap   Snores    Status post gastric banding    Wears glasses    driving    Family History  Problem Relation Age of Onset   Heart disease Mother    Alzheimer's disease Father    Past Surgical History:  Procedure Laterality Date   ANTERIOR CERVICAL DECOMPRESSION/DISCECTOMY FUSION 4 LEVELS N/A 02/26/2020   Procedure: ANTERIOR CERVICAL DECOMPRESSION/DISCECTOMY FUSION, INTERBODY PROSTHESIS, PLATE/SCREWS CERVICAL THREE-FOUR CERVICAL FOUR-FIVE CERVICAL FIVE-SIX- CERVICAL SIX-SEVEN;  Surgeon: Tressie Stalker, MD;  Location: [facility] OR;  Service: Neurosurgery;  Laterality: N/A;   CARPAL TUNNEL RELEASE Right 09/26/2012   Procedure: CARPAL TUNNEL RELEASE;  Surgeon: Tami Ribas, MD;  Location: [facility];  Service: Orthopedics;  Laterality: Right;   CARPAL TUNNEL RELEASE Left 10/24/2012   Procedure: CARPAL TUNNEL RELEASE;  Surgeon: Tami Ribas, MD;  Location: [facility];  Service: Orthopedics;  Laterality: Left;   EXCISION MELANOMA WITH SENTINEL LYMPH NODE BIOPSY  1984   rt neck with mulpipal nodes and some muscle excised rt neck-shoulder   KNEE ARTHROPLASTY     knee replacement     LAPAROSCOPIC GASTRIC BANDING  03/15/2009   LAPAROSCOPIC GASTRIC BANDING     MUSCLE BIOPSY  2004   rt thigh to r/o myocitis   nerve conduction velosity test     SHOULDER SURGERY Right 12/2021   TONSILLECTOMY     TOTAL KNEE ARTHROPLASTY Bilateral 12/05/2010   TRIGGER FINGER RELEASE Right 09/26/2012   Procedure: RELEASE TRIGGER FINGER/A-1 PULLEY RING AND SMALL ;  Surgeon: Tami Ribas, MD;  Location: [facility];  Service: Orthopedics;  Laterality: Right;   TRIGGER FINGER RELEASE Left 10/24/2012   Procedure: RELEASE TRIGGER FINGER/A-1 PULLEY LEFT MIDDLE AND LEFT RING;  Surgeon: Tami Ribas, MD;   Location: [facility];  Service: Orthopedics;  Laterality: Left;   UPPER GI ENDOSCOPY     Social History   Social History Narrative   Not on file   Immunization History  Administered Date(s) Administered   Moderna Sars-Covid-2 Vaccination 06/10/2019, 07/08/2019   PFIZER(Purple Top)SARS-COV-2 Vaccination 05/21/2020     Objective: Vital Signs: BP 122/80 (BP Location: Left Arm, Patient Position: Sitting, Cuff Size: Large)   Pulse 64   Resp 16   Ht 5\' 8"  (1.727 m)   Wt 256 lb 3.2 oz (116.2 kg)   BMI 38.96 kg/m    Physical Exam Vitals and nursing note reviewed.  Constitutional:      Appearance: He is well-developed.  HENT:     Head: Normocephalic and atraumatic.  Eyes:     Conjunctiva/sclera: Conjunctivae normal.     Pupils: Pupils are equal, round, and reactive to light.  Cardiovascular:     Rate and Rhythm: Normal rate and regular rhythm.     Heart sounds: Normal heart sounds.  Pulmonary:     Effort: Pulmonary effort is normal.     Breath sounds: Normal breath sounds.  Abdominal:     General: Bowel sounds are normal.     Palpations: Abdomen is soft.  Musculoskeletal:     Cervical back: Normal range of motion and neck supple.  Skin:    General: Skin is warm and dry.     Capillary Refill: Capillary refill takes less than 2 seconds.  Neurological:     Mental Status: He is alert and oriented to person, place, and time.  Psychiatric:        Behavior: Behavior normal.      Musculoskeletal Exam:.  Limited lateral rotation of the cervical spine.  Thoracic kyphosis was noted.  There was no tenderness about thoracic or lumbar spine.  He had right shoulder joint abduction about 120 degrees send limited abduction.  Left shoulder joint with good range of motion.  Mild contracture was noted in the right elbow joint.  No synovitis was noted.  Left elbow joint was in good range of motion.  Wrist joints MCPs PIPs and DIPs with good range of motion with no synovitis.   Bilateral PIP and DIP thickening was noted.  Bilateral hip joints with good range of motion.  Bilateral knee joints were replaced.  No warmth swelling or effusion was noted.  There was no tenderness over ankles or MTPs.  CDAI Exam: CDAI Score: -- Patient Global: 5 / 100; Provider Global: 5 / 100 Swollen: --; Tender: -- Joint Exam 12/27/2022   No joint exam has been documented for this visit   There is currently no information documented on the homunculus. Go to the Rheumatology activity and complete the homunculus joint exam.  Investigation: No additional findings.  Imaging: No results found.  Recent Labs: Lab Results  Component Value Date   WBC 5.6 08/20/2022   HGB 14.8 08/20/2022   PLT 227 08/20/2022   NA 132 (L) 08/21/2022   K 3.2 (L) 08/21/2022   CL 103 08/21/2022   CO2 23 08/21/2022   GLUCOSE 121 (H) 08/21/2022   BUN 11 08/21/2022   CREATININE 1.02 08/21/2022   BILITOT 1.2 08/20/2022   ALKPHOS 52 08/20/2022   AST 31 08/20/2022   ALT 25 08/20/2022   PROT 5.9 (L) 08/20/2022   ALBUMIN 3.1 (L) 08/20/2022   CALCIUM 8.1 (L) 08/21/2022   GFRAA 91 11/12/2020    Speciality Comments: PLQ Eye Exam: 11/10/2022 WNL @ [facility]. Follow up in 1 month for a [facility] [employer].   Procedures:  No procedures performed Allergies: Codeine, Dilaudid [hydromorphone hcl], Fentanyl, Nucynta [tapentadol], and Penicillins   Assessment / Plan:     Visit Diagnoses: Rheumatoid arthritis of multiple sites with negative rheumatoid factor (HCC) -patient denies having a flare of rheumatoid arthritis since the last visit.  He was hospitalized in April for sepsis due to undetermined organism.  Patient was off medications for 2 weeks and then resume the medications.  He had no synovitis on the examination today.  Will check labs today and will refill his medications.  He continues to have some morning stiffness.  Plan: sulfaSALAzine (AZULFIDINE) 500 MG tablet, methotrexate 50 MG/2ML  injection, hydroxychloroquine (PLAQUENIL) 200 MG tablet  High risk medication use - Methotrexate 1.0 ml sq injections once weekly, folic acid 2 mg daily, plaquenil 200 mg 1 tablet twice daily, and sulfasalazine 500 mg 2 tablets po BID. -Labs obtained on August 20, 2022 CBC and CMP were normal calcium was  low at 8.1.  Plan: CBC with Differential/Platelet, COMPLETE METABOLIC PANEL WITH GFR  Primary osteoarthritis of both hands-bilateral CMC PIP and DIP thickening was noted.  No synovitis was noted.  Joint protection muscle strength was discussed.  Arthritis of both acromioclavicular joints - S/p arthroscopic surgery-right-Dr. Magnus Ivan.  He states he is finally recovered from the surgery and has not having much discomfort.  He continues to have limited range of motion.  Hx of total knee replacement, bilateral - Dr. Charlann Boxer.  He had good range of motion of bilateral knee joints without any discomfort.  DDD (degenerative disc disease), cervical-he had limb lateral rotation.  DDD (degenerative disc disease), lumbar-he gives history of intermittent lower back discomfort.  Elevated CK -August 20, 2022 CK was elevated at 253.  Patient gives history of muscle cramps at night.  He is magnesium malate was advised.  Plan: CK  History of melanoma-followed by dermatologist on an annual basis.  History of sepsis-patient was hospitalized in April 2024 for sepsis due to an undetermined organism.  He was treated with antibiotics.  COVID-19 virus infection - 08/2022.  Patient states after he was discharged from the hospital he tested himself at home and he was positive for COVID-19 virus infection.  Patient believes that he acquired the infection during the hospital stay.  Orders: Orders Placed This Encounter  Procedures   CBC with Differential/Platelet   COMPLETE METABOLIC PANEL WITH GFR   CK   Meds ordered this encounter  Medications   sulfaSALAzine (AZULFIDINE) 500 MG tablet    Sig: Take 2 tablets (1,000  mg total) by mouth 2 (two) times daily.    Dispense:  360 tablet    Refill:  0   methotrexate 50 MG/2ML injection    Sig: INJECT UNDER THE SKIN ONCE WEEKLY    Dispense:  12 mL    Refill:  0   hydroxychloroquine (PLAQUENIL) 200 MG tablet    Sig: Take 1 tablet (200 mg total) by mouth 2 (two) times daily.    Dispense:  60 tablet    Refill:  0      Follow-Up Instructions: Return in about 5 months (around 05/29/2023) for Rheumatoid arthritis.   Pollyann Savoy, MD  Note - This record has been created using Animal nutritionist.  Chart creation errors have been sought, but may not always  have been located. Such creation errors do not reflect on  the standard of medical care.

## 2022-12-20 DIAGNOSIS — K080 Exfoliation of teeth due to systemic causes: Secondary | ICD-10-CM | POA: Diagnosis not present

## 2022-12-22 DIAGNOSIS — Z79899 Other long term (current) drug therapy: Secondary | ICD-10-CM | POA: Diagnosis not present

## 2022-12-22 DIAGNOSIS — H02831 Dermatochalasis of right upper eyelid: Secondary | ICD-10-CM | POA: Diagnosis not present

## 2022-12-22 DIAGNOSIS — H524 Presbyopia: Secondary | ICD-10-CM | POA: Diagnosis not present

## 2022-12-22 DIAGNOSIS — H02834 Dermatochalasis of left upper eyelid: Secondary | ICD-10-CM | POA: Diagnosis not present

## 2022-12-27 ENCOUNTER — Encounter: Payer: Self-pay | Admitting: Rheumatology

## 2022-12-27 ENCOUNTER — Ambulatory Visit: Payer: Medicare Other | Attending: Rheumatology | Admitting: Rheumatology

## 2022-12-27 VITALS — BP 122/80 | HR 64 | Resp 16 | Ht 68.0 in | Wt 256.2 lb

## 2022-12-27 DIAGNOSIS — M51369 Other intervertebral disc degeneration, lumbar region without mention of lumbar back pain or lower extremity pain: Secondary | ICD-10-CM

## 2022-12-27 DIAGNOSIS — M19042 Primary osteoarthritis, left hand: Secondary | ICD-10-CM

## 2022-12-27 DIAGNOSIS — M5136 Other intervertebral disc degeneration, lumbar region: Secondary | ICD-10-CM

## 2022-12-27 DIAGNOSIS — M0609 Rheumatoid arthritis without rheumatoid factor, multiple sites: Secondary | ICD-10-CM | POA: Diagnosis not present

## 2022-12-27 DIAGNOSIS — Z96653 Presence of artificial knee joint, bilateral: Secondary | ICD-10-CM

## 2022-12-27 DIAGNOSIS — U071 COVID-19: Secondary | ICD-10-CM

## 2022-12-27 DIAGNOSIS — M5030 Other cervical disc degeneration, unspecified cervical region: Secondary | ICD-10-CM

## 2022-12-27 DIAGNOSIS — R748 Abnormal levels of other serum enzymes: Secondary | ICD-10-CM | POA: Diagnosis not present

## 2022-12-27 DIAGNOSIS — Z79899 Other long term (current) drug therapy: Secondary | ICD-10-CM | POA: Diagnosis not present

## 2022-12-27 DIAGNOSIS — M19011 Primary osteoarthritis, right shoulder: Secondary | ICD-10-CM | POA: Diagnosis not present

## 2022-12-27 DIAGNOSIS — Z8619 Personal history of other infectious and parasitic diseases: Secondary | ICD-10-CM

## 2022-12-27 DIAGNOSIS — M19041 Primary osteoarthritis, right hand: Secondary | ICD-10-CM | POA: Diagnosis not present

## 2022-12-27 DIAGNOSIS — Z85820 Personal history of malignant melanoma of skin: Secondary | ICD-10-CM

## 2022-12-27 DIAGNOSIS — M19012 Primary osteoarthritis, left shoulder: Secondary | ICD-10-CM

## 2022-12-27 LAB — CBC WITH DIFFERENTIAL/PLATELET
Absolute Monocytes: 739 cells/uL (ref 200–950)
Basophils Absolute: 62 cells/uL (ref 0–200)
Basophils Relative: 0.8 %
Eosinophils Absolute: 162 cells/uL (ref 15–500)
Eosinophils Relative: 2.1 %
HCT: 43.5 % (ref 38.5–50.0)
Hemoglobin: 14.6 g/dL (ref 13.2–17.1)
Lymphs Abs: 2094 cells/uL (ref 850–3900)
MCH: 30.9 pg (ref 27.0–33.0)
MCHC: 33.6 g/dL (ref 32.0–36.0)
MCV: 92 fL (ref 80.0–100.0)
MPV: 9.8 fL (ref 7.5–12.5)
Monocytes Relative: 9.6 %
Neutro Abs: 4643 cells/uL (ref 1500–7800)
Neutrophils Relative %: 60.3 %
Platelets: 282 10*3/uL (ref 140–400)
RBC: 4.73 10*6/uL (ref 4.20–5.80)
RDW: 14.4 % (ref 11.0–15.0)
Total Lymphocyte: 27.2 %
WBC: 7.7 10*3/uL (ref 3.8–10.8)

## 2022-12-27 MED ORDER — METHOTREXATE SODIUM CHEMO INJECTION 50 MG/2ML: INTRAMUSCULAR | 0 refills | Status: DC

## 2022-12-27 MED ORDER — SULFASALAZINE 500 MG PO TABS
1000.0000 mg | ORAL_TABLET | Freq: Two times a day (BID) | ORAL | 0 refills | Status: DC
Start: 2022-12-27 — End: 2023-05-09

## 2022-12-27 MED ORDER — HYDROXYCHLOROQUINE SULFATE 200 MG PO TABS: 200.0000 mg | ORAL_TABLET | Freq: Two times a day (BID) | ORAL | 0 refills | Status: DC

## 2022-12-27 NOTE — Patient Instructions (Addendum)
Standing Labs We placed an order today for your standing lab work.   Please have your standing labs drawn in November and every 3 months  Please have your labs drawn 2 weeks prior to your appointment so that the provider can discuss your lab results at your appointment, if possible.  Please note that you may see your imaging and lab results in MyChart before we have reviewed them. We will contact you once all results are reviewed. Please allow our office up to 72 hours to thoroughly review all of the results before contacting the office for clarification of your results.  WALK-IN LAB HOURS  Monday through Thursday from 8:00 am -12:30 pm and 1:00 pm-5:00 pm and Friday from 8:00 am-12:00 pm.  Patients with office visits requiring labs will be seen before walk-in labs.  You may encounter longer than normal wait times. Please allow additional time. Wait times may be shorter on  Monday and Thursday afternoons.  We do not book appointments for walk-in labs. We appreciate your patience and understanding with our staff.   Labs are drawn by [facility]. Please bring your co-pay at the time of your lab draw.  You may receive a bill from [facility] for your lab work.  Please note if you are on Hydroxychloroquine and and an order has been placed for a Hydroxychloroquine level,  you will need to have it drawn 4 hours or more after your last dose.  If you wish to have your labs drawn at another location, please call the office 24 hours in advance so we can fax the orders.  The office is located at [address], [address], [place], [place] [address]   If you have any questions regarding directions or hours of operation,  please call [phone].   As a reminder, please drink plenty of water prior to coming for your lab work. Thanks!  Vaccines You are taking a medication(s) that can suppress your immune system.  The following immunizations are recommended: Flu annually Covid-19  RSV Td/Tdap (tetanus,  diphtheria, pertussis) every 10 years Pneumonia (Prevnar 15 then Pneumovax 23 at least 1 year apart.  Alternatively, can take Prevnar 20 without needing additional dose) Shingrix: 2 doses from 4 weeks to 6 months apart  Please check with your PCP to make sure you are up to date.   If you have signs or symptoms of an infection or start antibiotics: First, call your PCP for workup of your infection. Hold your medication through the infection, until you complete your antibiotics, and until symptoms resolve if you take the following: Injectable medication (Actemra, Benlysta, Cimzia, Cosentyx, Enbrel, Humira, Kevzara, Orencia, Remicade, Simponi, Stelara, Taltz, Tremfya) Methotrexate Leflunomide (Arava) Mycophenolate (Cellcept) Harriette Ohara, Olumiant, or Rinvoq  Heart Disease Prevention   Your inflammatory disease increases your risk of heart disease which includes heart attack, stroke, atrial fibrillation (irregular heartbeats), high blood pressure, heart failure and atherosclerosis (plaque in the arteries).  It is important to reduce your risk by:   Keep blood pressure, cholesterol, and blood sugar at healthy levels   Smoking Cessation   Maintain a healthy weight  BMI 20-25   Eat a healthy diet  Plenty of fresh fruit, vegetables, and whole grains  Limit saturated fats, foods high in sodium, and added sugars  DASH and Mediterranean diet   Increase physical activity  Recommend moderate physically activity for 150 minutes per week/ 30 minutes a day for five days a week These can be broken up into three separate ten-minute sessions  during the day.   Reduce Stress  Meditation, slow breathing exercises, yoga, coloring books  Dental visits twice a year

## 2022-12-28 NOTE — Progress Notes (Signed)
CBC and CMP are normal.  CK is mildly elevated and stable.

## 2023-01-01 DIAGNOSIS — K080 Exfoliation of teeth due to systemic causes: Secondary | ICD-10-CM | POA: Diagnosis not present

## 2023-01-24 ENCOUNTER — Telehealth: Payer: Self-pay

## 2023-01-24 NOTE — Telephone Encounter (Signed)
Request for Celebrex 200 BID We haven't seen him since 04/2022 [facility] [employer]

## 2023-01-24 NOTE — Telephone Encounter (Signed)
We have never prescribed the Celebrex for this patient. We would generally not prescribe it as it is a contraindication with Methotrexate long term.

## 2023-03-13 DIAGNOSIS — I10 Essential (primary) hypertension: Secondary | ICD-10-CM | POA: Diagnosis not present

## 2023-03-13 DIAGNOSIS — M791 Myalgia, unspecified site: Secondary | ICD-10-CM | POA: Diagnosis not present

## 2023-03-13 DIAGNOSIS — I700 Atherosclerosis of aorta: Secondary | ICD-10-CM | POA: Diagnosis not present

## 2023-03-13 DIAGNOSIS — D352 Benign neoplasm of pituitary gland: Secondary | ICD-10-CM | POA: Diagnosis not present

## 2023-03-15 ENCOUNTER — Ambulatory Visit: Payer: Medicare Other | Admitting: Physician Assistant

## 2023-03-24 ENCOUNTER — Other Ambulatory Visit: Payer: Self-pay | Admitting: Rheumatology

## 2023-03-24 DIAGNOSIS — M0609 Rheumatoid arthritis without rheumatoid factor, multiple sites: Secondary | ICD-10-CM

## 2023-03-26 NOTE — Telephone Encounter (Signed)
Last Fill: 12/27/2022  Labs: 12/27/2022 CBC and CMP are normal.  CK is mildly elevated and stable. I called patient's wife, David [name], who will inform patient labs are due.  Next Visit: 05/28/2023  Last Visit: 12/27/2022  DX: Rheumatoid arthritis of multiple sites with negative rheumatoid factor   Current Dose per office note 12/27/2022: Methotrexate 1.0 ml sq injections once weekly,   Okay to refill Methotrexate?

## 2023-04-11 ENCOUNTER — Other Ambulatory Visit: Payer: Self-pay | Admitting: Rheumatology

## 2023-04-11 NOTE — Telephone Encounter (Signed)
Last Fill: 02/08/2022  Next Visit: 05/28/2023  Last Visit: 12/27/2022  Dx: Rheumatoid arthritis of multiple sites with negative rheumatoid factor   Current Dose per office note on 12/27/2022: folic acid 2 mg daily   Okay to refill Folic Acid?

## 2023-04-24 ENCOUNTER — Other Ambulatory Visit: Payer: Self-pay | Admitting: Orthopaedic Surgery

## 2023-05-03 ENCOUNTER — Other Ambulatory Visit: Payer: Self-pay | Admitting: Physician Assistant

## 2023-05-03 DIAGNOSIS — M0609 Rheumatoid arthritis without rheumatoid factor, multiple sites: Secondary | ICD-10-CM

## 2023-05-08 ENCOUNTER — Other Ambulatory Visit: Payer: Self-pay

## 2023-05-08 DIAGNOSIS — Z79899 Other long term (current) drug therapy: Secondary | ICD-10-CM

## 2023-05-09 ENCOUNTER — Other Ambulatory Visit: Payer: Self-pay | Admitting: *Deleted

## 2023-05-09 DIAGNOSIS — M0609 Rheumatoid arthritis without rheumatoid factor, multiple sites: Secondary | ICD-10-CM

## 2023-05-09 LAB — CBC WITH DIFFERENTIAL/PLATELET
Absolute Lymphocytes: 1902 {cells}/uL (ref 850–3900)
Absolute Monocytes: 609 {cells}/uL (ref 200–950)
Basophils Absolute: 29 {cells}/uL (ref 0–200)
Basophils Relative: 0.5 %
Eosinophils Absolute: 122 {cells}/uL (ref 15–500)
Eosinophils Relative: 2.1 %
HCT: 45.8 % (ref 38.5–50.0)
Hemoglobin: 15.2 g/dL (ref 13.2–17.1)
MCH: 31.5 pg (ref 27.0–33.0)
MCHC: 33.2 g/dL (ref 32.0–36.0)
MCV: 94.8 fL (ref 80.0–100.0)
MPV: 9.4 fL (ref 7.5–12.5)
Monocytes Relative: 10.5 %
Neutro Abs: 3138 {cells}/uL (ref 1500–7800)
Neutrophils Relative %: 54.1 %
Platelets: 240 10*3/uL (ref 140–400)
RBC: 4.83 10*6/uL (ref 4.20–5.80)
RDW: 13.8 % (ref 11.0–15.0)
Total Lymphocyte: 32.8 %
WBC: 5.8 10*3/uL (ref 3.8–10.8)

## 2023-05-09 LAB — COMPLETE METABOLIC PANEL WITH GFR
AG Ratio: 1.6 (calc) (ref 1.0–2.5)
ALT: 24 U/L (ref 9–46)
AST: 28 U/L (ref 10–35)
Albumin: 4.2 g/dL (ref 3.6–5.1)
Alkaline phosphatase (APISO): 77 U/L (ref 35–144)
BUN: 16 mg/dL (ref 7–25)
CO2: 31 mmol/L (ref 20–32)
Calcium: 10 mg/dL (ref 8.6–10.3)
Chloride: 103 mmol/L (ref 98–110)
Creat: 0.91 mg/dL (ref 0.70–1.28)
Globulin: 2.6 g/dL (ref 1.9–3.7)
Glucose, Bld: 92 mg/dL (ref 65–99)
Potassium: 5.1 mmol/L (ref 3.5–5.3)
Sodium: 140 mmol/L (ref 135–146)
Total Bilirubin: 0.5 mg/dL (ref 0.2–1.2)
Total Protein: 6.8 g/dL (ref 6.1–8.1)
eGFR: 90 mL/min/{1.73_m2} (ref >=60)

## 2023-05-09 MED ORDER — HYDROXYCHLOROQUINE SULFATE 200 MG PO TABS: 200.0000 mg | ORAL_TABLET | Freq: Two times a day (BID) | ORAL | 0 refills | Status: DC

## 2023-05-09 MED ORDER — SULFASALAZINE 500 MG PO TABS: 1000.0000 mg | ORAL_TABLET | Freq: Two times a day (BID) | ORAL | 0 refills | Status: DC

## 2023-05-09 NOTE — Progress Notes (Signed)
CBC and CMP are normal.

## 2023-05-09 NOTE — Telephone Encounter (Signed)
Last Fill: 12/27/2022  Eye exam: 11/10/2022   Labs: 05/08/2023  CBC and CMP are normal.   Next Visit: 05/28/2023  Last Visit: 12/27/2022  [id number] arthritis of multiple sites with negative rheumatoid factor   Current Dose per office note 12/27/2022: plaquenil 200 mg 1 tablet twice daily, sulfasalazine 500 mg 2 tablets po BID   Okay to refill Plaquenil and sulfasalazine?

## 2023-05-14 DIAGNOSIS — Z23 Encounter for immunization: Secondary | ICD-10-CM | POA: Diagnosis not present

## 2023-05-14 DIAGNOSIS — E782 Mixed hyperlipidemia: Secondary | ICD-10-CM | POA: Diagnosis not present

## 2023-05-14 DIAGNOSIS — F325 Major depressive disorder, single episode, in full remission: Secondary | ICD-10-CM | POA: Diagnosis not present

## 2023-05-14 DIAGNOSIS — N400 Enlarged prostate without lower urinary tract symptoms: Secondary | ICD-10-CM | POA: Diagnosis not present

## 2023-05-14 DIAGNOSIS — I10 Essential (primary) hypertension: Secondary | ICD-10-CM | POA: Diagnosis not present

## 2023-05-14 DIAGNOSIS — Z0000 Encounter for general adult medical examination without abnormal findings: Secondary | ICD-10-CM | POA: Diagnosis not present

## 2023-05-14 DIAGNOSIS — M069 Rheumatoid arthritis, unspecified: Secondary | ICD-10-CM | POA: Diagnosis not present

## 2023-05-14 NOTE — Progress Notes (Signed)
 Office Visit Note  Patient: David [name]             Date of Birth: 17-Dec-1950           MRN: [id number]             PCP: Crecencio Chiquita POUR, FNP Referring: Crecencio Chiquita POUR, FNP Visit Date: 05/28/2023 Occupation: @GUAROCC @  Subjective:  Pain in both shoulders   History of Present Illness: David [name] is a 72 y.o. male with history of seronegative rheumatoid arthritis and osteoarthritis.  Patient remains on  Methotrexate  1.0 ml sq injections once weekly, folic acid  2 mg daily, plaquenil  200 mg 1 tablet twice daily, and sulfasalazine  500 mg 2 tablets po BID. He is tolerating combination therapy without any side effects.  He denies any recent gaps in therapy.  Patient presents today with increased pain involving both shoulders and upper arms.  Patient reports that his pain started 5 to 6 months ago involving his right shoulder and about 3 to 4 months ago involving the left shoulder.  He has not yet been evaluated at [facility] but has a visit scheduled tomorrow.  He denies any cervical spine pain so he has not yet scheduled a visit with Dr. Mavis but is concerned that he has had progression of his spinal stenosis.  He has been taking some family members Vicodin as needed when he experiences severe pain.  His pain level has been as severe as a 8/10.  Patient states that his pain levels have been significantly interfering with his quality of life.  He has been unable to perform a lot of activities that he would like to due to the severity of pain. He denies any joint swelling at this time.    Activities of Daily Living:  Patient reports morning stiffness for 4 hours.   Patient Reports nocturnal pain.  Difficulty dressing/grooming: Reports Difficulty climbing stairs: Denies Difficulty getting out of chair: Reports Difficulty using hands for taps, buttons, cutlery, and/or writing: Reports  Review of Systems  Constitutional:  Negative for fatigue.  HENT:  Positive for mouth dryness.  Negative for mouth sores.   Eyes:  Positive for dryness.  Respiratory:  Negative for shortness of breath.   Cardiovascular:  Negative for chest pain and palpitations.  Gastrointestinal:  Negative for blood in stool, constipation and diarrhea.  Endocrine: Positive for increased urination.  Genitourinary:  Positive for involuntary urination.  Musculoskeletal:  Positive for joint pain, gait problem, joint pain, myalgias, muscle weakness, morning stiffness, muscle tenderness and myalgias. Negative for joint swelling.  Skin:  Positive for color change and sensitivity to sunlight. Negative for rash and hair loss.  Allergic/Immunologic: Negative for susceptible to infections.  Neurological:  Negative for dizziness and headaches.  Hematological:  Negative for swollen glands.  Psychiatric/Behavioral:  Positive for sleep disturbance. Negative for depressed mood. The patient is not nervous/anxious.     PMFS History:  Patient Active Problem List   Diagnosis Date Noted   Sepsis due to undetermined organism (HCC) 08/20/2022   Cervical spondylosis with radiculopathy 02/26/2020   SBO (small bowel obstruction) (HCC) 08/21/2017   Trochanteric bursitis of left hip 01/18/2017   Lateral epicondylitis, right elbow 01/18/2017   Rheumatoid arthritis (HCC) 03/15/2016   DJD (degenerative joint disease), cervical 03/15/2016   DDD (degenerative disc disease), lumbar 03/15/2016   HTN (hypertension) 03/15/2016   Obesity 03/15/2016   Elevated cholesterol 03/15/2016   Neuralgia 03/15/2016   Malignant melanoma of right side of neck (  HCC) 03/15/2016   BBB (bundle branch block) 03/15/2016   High risk medication use 03/15/2016   Osteoarthritis of lumbar spine 03/15/2016   Primary osteoarthritis of both hands 03/15/2016   Primary osteoarthritis of both knees 03/15/2016   Mesenteric adenitis 06/29/2015    Past Medical History:  Diagnosis Date   Arthritis    Balance problem    BBB (bundle branch block) 03/15/2016    Carpal tunnel syndrome    DDD (degenerative disc disease), lumbar 03/15/2016   Depression    Dysrhythmia    had some tachycardia with steroids   Elevated cholesterol 03/15/2016   Headache(784.0)    Ocular Migraines - takes Ambien  for it   Heart murmur    HTN (hypertension) 03/15/2016   Hyperlipemia    Hypertension    Malignant melanoma of right side of neck (HCC) 03/15/2016   30 years ago    Neuralgia 03/15/2016   Ocular migraine    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    RLS (restless legs syndrome)    Sleep apnea    could not use a cpap   Snores    Status post gastric banding    Wears glasses    driving    Family History  Problem Relation Age of Onset   Heart disease Mother    Alzheimer's disease Father    Past Surgical History:  Procedure Laterality Date   ANTERIOR CERVICAL DECOMPRESSION/DISCECTOMY FUSION 4 LEVELS N/A 02/26/2020   Procedure: ANTERIOR CERVICAL DECOMPRESSION/DISCECTOMY FUSION, INTERBODY PROSTHESIS, PLATE/SCREWS CERVICAL THREE-FOUR CERVICAL FOUR-FIVE CERVICAL FIVE-SIX- CERVICAL SIX-SEVEN;  Surgeon: Mavis Purchase, MD;  Location: [facility] OR;  Service: Neurosurgery;  Laterality: N/A;   CARPAL TUNNEL RELEASE Right 09/26/2012   Procedure: CARPAL TUNNEL RELEASE;  Surgeon: Franky JONELLE Curia, MD;  Location: [facility];  Service: Orthopedics;  Laterality: Right;   CARPAL TUNNEL RELEASE Left 10/24/2012   Procedure: CARPAL TUNNEL RELEASE;  Surgeon: Franky JONELLE Curia, MD;  Location: [facility];  Service: Orthopedics;  Laterality: Left;   EXCISION MELANOMA WITH SENTINEL LYMPH NODE BIOPSY  1984   rt neck with mulpipal nodes and some muscle excised rt neck-shoulder   KNEE ARTHROPLASTY     knee replacement     LAPAROSCOPIC GASTRIC BANDING  03/15/2009   LAPAROSCOPIC GASTRIC BANDING     MUSCLE BIOPSY  2004   rt thigh to r/o myocitis   nerve conduction velosity test      SHOULDER SURGERY Right 12/2021   TONSILLECTOMY     TOTAL KNEE ARTHROPLASTY Bilateral 12/05/2010   TRIGGER FINGER RELEASE Right 09/26/2012   Procedure: RELEASE TRIGGER FINGER/A-1 PULLEY RING AND SMALL ;  Surgeon: Franky JONELLE Curia, MD;  Location: [facility];  Service: Orthopedics;  Laterality: Right;   TRIGGER FINGER RELEASE Left 10/24/2012   Procedure: RELEASE TRIGGER FINGER/A-1 PULLEY LEFT MIDDLE AND LEFT RING;  Surgeon: Franky JONELLE Curia, MD;  Location: [facility];  Service: Orthopedics;  Laterality: Left;   UPPER GI ENDOSCOPY     Social History   Social History Narrative   Not on file   Immunization History  Administered Date(s) Administered   Moderna Sars-Covid-2 Vaccination 06/10/2019, 07/08/2019   PFIZER(Purple Top)SARS-COV-2 Vaccination 05/21/2020     Objective: Vital Signs: BP 121/75 (BP Location: Left Arm, Patient Position: Sitting, Cuff Size: Normal)   Pulse 67   Resp 16   Ht 5' 8 (1.727 m)  Wt 260 lb (117.9 kg)   BMI 39.53 kg/m    Physical Exam Vitals and nursing note reviewed.  Constitutional:      Appearance: He is well-developed.  HENT:     Head: Normocephalic and atraumatic.  Eyes:     Conjunctiva/sclera: Conjunctivae normal.     Pupils: Pupils are equal, round, and reactive to light.  Cardiovascular:     Rate and Rhythm: Normal rate and regular rhythm.     Heart sounds: Normal heart sounds.  Pulmonary:     Effort: Pulmonary effort is normal.     Breath sounds: Normal breath sounds.  Abdominal:     General: Bowel sounds are normal.     Palpations: Abdomen is soft.  Musculoskeletal:     Cervical back: Normal range of motion and neck supple.  Skin:    General: Skin is warm and dry.     Capillary Refill: Capillary refill takes less than 2 seconds.  Neurological:     Mental Status: He is alert and oriented to person, place, and time.  Psychiatric:        Behavior: Behavior normal.      Musculoskeletal Exam: Patient remained  seated during the examination today.  C-spine has limited ROM with lateral rotation.  Thoracic kyphosis noted.  No midline spinal tenderness.  No SI joint tenderness.  Painful and limited ROM of both shoulders.  Mild flexion contracture of the right elbow.  Wrist joints, MCPs, PIPs, DIPs have good range of motion with no synovitis.  PIP and DIP thickening consistent with osteoarthritis of both hands noted.  Hip joints difficult assess in seated position.  Bilateral knee replacements have good range of motion with no effusion.  Ankle joints have good range of motion with no tenderness.  CDAI Exam: CDAI Score: -- Patient Global: --; Provider Global: -- Swollen: --; Tender: -- Joint Exam 05/28/2023   No joint exam has been documented for this visit   There is currently no information documented on the homunculus. Go to the Rheumatology activity and complete the homunculus joint exam.  Investigation: No additional findings.  Imaging: No results found.  Recent Labs: Lab Results  Component Value Date   WBC 5.8 05/08/2023   HGB 15.2 05/08/2023   PLT 240 05/08/2023   NA 140 05/08/2023   K 5.1 05/08/2023   CL 103 05/08/2023   CO2 31 05/08/2023   GLUCOSE 92 05/08/2023   BUN 16 05/08/2023   CREATININE 0.91 05/08/2023   BILITOT 0.5 05/08/2023   ALKPHOS 52 08/20/2022   AST 28 05/08/2023   ALT 24 05/08/2023   PROT 6.8 05/08/2023   ALBUMIN  3.1 (L) 08/20/2022   CALCIUM 10.0 05/08/2023   GFRAA 91 11/12/2020    Speciality Comments: PLQ Eye Exam: 11/10/2022 WNL @ [facility]. Follow up in 1 month for a Visual [employer].   Called to obtain visual field (12/27/22)-MG  Procedures:  No procedures performed Allergies: Codeine, Dilaudid  [hydromorphone  hcl], Fentanyl , Nucynta [tapentadol], and Penicillins    Assessment / Plan:     Visit Diagnoses: Rheumatoid arthritis of multiple sites with negative rheumatoid factor (HCC): He has no synovitis on examination today.  He has not had any  signs or symptoms of a rheumatoid arthritis flare.  He has clinically been doing well on methotrexate  1 mg sq injections once weekly, folic acid  2 mg daily, Plaquenil  200 mg 1 tablet by mouth twice daily, and sulfasalazine  500 mg 2 tablets by mouth twice daily.  He has  been tolerating triple therapy without any side effects and has not had any recent gaps in therapy.  He has been experiencing increased pain in both shoulders as well as soreness involving both upper arms.  He has not yet scheduled an appointment with Dr. Mavis but is scheduled to follow-up at [facility] tomorrow for further evaluation.  He is concerned that his cervical spinal stenosis has progressed and feels that he may need an updated MRI.  He will notify us  if the next steps in evaluation and treatment.  In the meantime he will remain on the current treatment regimen.  He was advised to notify us  if he develops signs or symptoms of a flare.  He will follow-up in the office in 5 months or sooner if needed.  High risk medication use - Methotrexate  1.0 ml sq injections once weekly, folic acid  2 mg daily, plaquenil  200 mg 1 tablet twice daily, and sulfasalazine  500 mg 2 tablets po BID. CBC and CMP updated on 05/08/23.  His next lab work will be due in March and every 3 months.   PLQ Eye Exam: 11/10/2022 WNL @ [facility].  No recent or recurrent infections.  Discussed the importance of holding methotrexate  and sulfasalazine  if he develops signs or symptoms of an infection and to resume once the infection has completely cleared.   Primary osteoarthritis of both hands: PIP and DIP thickening consistent with osteoarthritis of both hands.  No active inflammation noted.   Arthritis of both acromioclavicular joints - S/p arthroscopic surgery-right-Dr. Vernetta.  Patient presents today with significant pain and stiffness involving both shoulders.  According to the patient his right shoulder pain recurred about 6 months ago and the left  shoulder started 3 to 4 months ago.  His pain level has been severe to the point that it is interfering with his quality of life.  He has been unable to perform activities that he would like to at home.  He is scheduled to follow-up at [facility] tomorrow for further evaluation.  Hx of total knee replacement, bilateral - Dr. Fronie well.  Good ROM with no effusion.   DDD (degenerative disc disease), cervical: S/p fusion in 2021: C-spine has limited ROM with lateral rotation.  Patient experiencing increased pain involving both upper extremities and is concerned that his spinal stenosis has progressed.  He has not yet scheduled appointment Dr. Mavis but has a follow-up visit tomorrow at [facility] for further evaluation.    Degeneration of intervertebral disc of lumbar region without discogenic back pain or lower extremity pain: No midline spinal tenderness.  No symptoms of radiculopathy.   Other medical conditions are listed as follows:   Elevated CK - CK 453 on 08/20/22. CK improved to 286 on 12/27/22.  No muscular weakness at this time.   History of melanoma  History of sepsis  COVID-19 virus infection  Impetigo  History of obesity  Orders: No orders of the defined types were placed in this encounter.  No orders of the defined types were placed in this encounter.    Follow-Up Instructions: Return in about 5 months (around 10/26/2023) for Rheumatoid arthritis, Osteoarthritis.   Waddell CHRISTELLA Craze, PA-C  Note - This record has been created using Dragon software.  Chart creation errors have been sought, but may not always  have been located. Such creation errors do not reflect on  the standard of medical care.,

## 2023-05-28 ENCOUNTER — Encounter: Payer: Self-pay | Admitting: Physician Assistant

## 2023-05-28 ENCOUNTER — Ambulatory Visit: Payer: Medicare Other | Admitting: Physician Assistant

## 2023-05-28 ENCOUNTER — Ambulatory Visit: Payer: Medicare Other | Attending: Physician Assistant | Admitting: Physician Assistant

## 2023-05-28 VITALS — BP 121/75 | HR 67 | Resp 16 | Ht 68.0 in | Wt 260.0 lb

## 2023-05-28 DIAGNOSIS — M19011 Primary osteoarthritis, right shoulder: Secondary | ICD-10-CM | POA: Diagnosis not present

## 2023-05-28 DIAGNOSIS — M19012 Primary osteoarthritis, left shoulder: Secondary | ICD-10-CM

## 2023-05-28 DIAGNOSIS — M0609 Rheumatoid arthritis without rheumatoid factor, multiple sites: Secondary | ICD-10-CM

## 2023-05-28 DIAGNOSIS — M5030 Other cervical disc degeneration, unspecified cervical region: Secondary | ICD-10-CM

## 2023-05-28 DIAGNOSIS — M51369 Other intervertebral disc degeneration, lumbar region without mention of lumbar back pain or lower extremity pain: Secondary | ICD-10-CM

## 2023-05-28 DIAGNOSIS — Z85820 Personal history of malignant melanoma of skin: Secondary | ICD-10-CM

## 2023-05-28 DIAGNOSIS — M19041 Primary osteoarthritis, right hand: Secondary | ICD-10-CM

## 2023-05-28 DIAGNOSIS — Z79899 Other long term (current) drug therapy: Secondary | ICD-10-CM

## 2023-05-28 DIAGNOSIS — Z8619 Personal history of other infectious and parasitic diseases: Secondary | ICD-10-CM

## 2023-05-28 DIAGNOSIS — Z8639 Personal history of other endocrine, nutritional and metabolic disease: Secondary | ICD-10-CM

## 2023-05-28 DIAGNOSIS — R748 Abnormal levels of other serum enzymes: Secondary | ICD-10-CM

## 2023-05-28 DIAGNOSIS — Z96653 Presence of artificial knee joint, bilateral: Secondary | ICD-10-CM

## 2023-05-28 DIAGNOSIS — L0100 Impetigo, unspecified: Secondary | ICD-10-CM

## 2023-05-28 DIAGNOSIS — G8929 Other chronic pain: Secondary | ICD-10-CM

## 2023-05-28 DIAGNOSIS — M19042 Primary osteoarthritis, left hand: Secondary | ICD-10-CM

## 2023-05-28 DIAGNOSIS — U071 COVID-19: Secondary | ICD-10-CM

## 2023-05-28 DIAGNOSIS — M25511 Pain in right shoulder: Secondary | ICD-10-CM

## 2023-05-28 NOTE — Patient Instructions (Signed)
 Standing Labs We placed an order today for your standing lab work.   Please have your standing labs drawn in March and every 3 months   Please have your labs drawn 2 weeks prior to your appointment so that the provider can discuss your lab results at your appointment, if possible.  Please note that you may see your imaging and lab results in MyChart before we have reviewed them. We will contact you once all results are reviewed. Please allow our office up to 72 hours to thoroughly review all of the results before contacting the office for clarification of your results.  WALK-IN LAB HOURS  Monday through Thursday from 8:00 am -12:30 pm and 1:00 pm-5:00 pm and Friday from 8:00 am-12:00 pm.  Patients with office visits requiring labs will be seen before walk-in labs.  You may encounter longer than normal wait times. Please allow additional time. Wait times may be shorter on  Monday and Thursday afternoons.  We do not book appointments for walk-in labs. We appreciate your patience and understanding with our staff.   Labs are drawn by [facility]. Please bring your co-pay at the time of your lab draw.  You may receive a bill from [facility] for your lab work.  Please note if you are on Hydroxychloroquine and and an order has been placed for a Hydroxychloroquine level,  you will need to have it drawn 4 hours or more after your last dose.  If you wish to have your labs drawn at another location, please call the office 24 hours in advance so we can fax the orders.  The office is located at [address], [address], [place], [place] [address]   If you have any questions regarding directions or hours of operation,  please call [phone].   As a reminder, please drink plenty of water prior to coming for your lab work. Thanks!

## 2023-05-29 ENCOUNTER — Other Ambulatory Visit: Payer: Self-pay

## 2023-05-29 ENCOUNTER — Encounter: Payer: Self-pay | Admitting: Physician Assistant

## 2023-05-29 ENCOUNTER — Ambulatory Visit (INDEPENDENT_AMBULATORY_CARE_PROVIDER_SITE_OTHER): Payer: Medicare Other | Admitting: Physician Assistant

## 2023-05-29 DIAGNOSIS — M25511 Pain in right shoulder: Secondary | ICD-10-CM

## 2023-05-29 DIAGNOSIS — M25512 Pain in left shoulder: Secondary | ICD-10-CM

## 2023-05-29 MED ORDER — LIDOCAINE HCL 1 % IJ SOLN
3.0000 mL | INTRAMUSCULAR | Status: AC | PRN
Start: 2023-05-29 — End: 2023-05-29
  Administered 2023-05-29: 3 mL

## 2023-05-29 MED ORDER — METHYLPREDNISOLONE ACETATE 40 MG/ML IJ SUSP
40.0000 mg | INTRAMUSCULAR | Status: AC | PRN
Start: 2023-05-29 — End: 2023-05-29
  Administered 2023-05-29: 40 mg via INTRA_ARTICULAR

## 2023-05-29 NOTE — Progress Notes (Signed)
 Office Visit Note   Patient: David [name]           Date of Birth: 03-13-1951           MRN: [id number] Visit Date: 05/29/2023              Requested by: Crecencio Chiquita POUR, FNP [address] [place],  [place] [address] PCP: Crecencio, Chiquita POUR, FNP   Assessment & Plan: Visit Diagnoses:  1. Left shoulder pain, unspecified chronicity   2. Right shoulder pain, unspecified chronicity     Plan: Patient is a 73 year old gentleman here with a chief complaint of right greater than left shoulder pain and decreased range of motion.  He is status post right shoulder arthroscopy with Dr. Vernetta not quite 2 years ago.  He did have a mechanical fall onto his right shoulder after surgery.  Dr. Vernetta followed him and had him work with PT.  His surgery was an arthroscopy debridement and subacromial decompression.  He did go through physical therapy and his shoulder got much better.  He comes in today with very limited motion of his shoulder and significant pain.  He is also beginning to have similar symptoms on the left shoulder.  He also is status post cervical surgery with Dr. Mavis and was wondering if we could order a new Era MRI.  Of note he has a history of rheumatoid arthritis but cannot take any Biologics because he has a history of a melanoma being removed from his neck many years ago.  Certainly a complicated situation.  As far as his neck I have told him as he was advised by rheumatology to touch back with Dr. Mavis.  He has pain but no acute weakness or loss related to his neck today.  He is very limited in his right shoulder has definitely degenerative changes of the [facility] joint and the glenohumeral joint of both shoulders.  He has had subacromial injections in the past that have helped him.  I will go forward with that.  Given the very limited motion of his right shoulder and history of the fall after surgery and recommending an MRI of the right shoulder and referral to Dr.  Addie  Follow-Up Instructions: No follow-ups on file.   Orders:  Orders Placed This Encounter  Procedures  . XR Shoulder Left  . XR Shoulder Right   No orders of the defined types were placed in this encounter.     Procedures: Large Joint Inj: bilateral subacromial bursa on 05/29/2023 11:11 AM Indications: diagnostic evaluation and pain Details: 25 G 1.5 in needle, posterior approach  Arthrogram: No  Medications (Right): 3 mL lidocaine  1 %; 40 mg methylPREDNISolone  acetate 40 MG/ML Medications (Left): 3 mL lidocaine  1 %; 40 mg methylPREDNISolone  acetate 40 MG/ML Outcome: tolerated well, no immediate complications Procedure, treatment alternatives, risks and benefits explained, specific risks discussed. Consent was given by the patient.     Clinical Data: No additional findings.   Subjective: No chief complaint on file.   HPI patient is a pleasant 73 year old gentleman with right greater than left shoulder pain.  He is status post right shoulder arthroscopy in July 2023 with Dr. Vernetta at which time he had a subacromial decompression and debridement.  He did have a postoperative course that was complicated by an infected olecranon bursa as well as a mechanical fall onto the right shoulder.  He did go through physical therapy and had subacromial injection and by December he  was doing quite well.  He comes in today no recurrent injury but has very limited use of his right arm because of pain.  Also because of this his left arm is starting to bother him.  He is having difficulty with activities of daily living.  Review of Systems  All other systems reviewed and are negative.    Objective: Vital Signs: There were no vitals taken for this visit.  Physical Exam Constitutional:      Appearance: Normal appearance.  Skin:    General: Skin is warm and dry.  Neurological:     General: No focal deficit present.     Mental Status: He is alert.  Psychiatric:        Mood and  Affect: Mood normal.    Ortho Exam Right shoulder well-healed surgical portals.  Grip strength is intact he is neurologically intact distally.  He can only forward elevate to about 95 degrees before it is too painful to him he can internally rotate behind his back almost to his buccal line.  He has no real pain with external rotation of his shoulder but significantly painful with empty can testing and speeds testing Left shoulder he can forward elevate to about 150 degrees has more trouble with internal rotation behind his back.  No pain with external rotation strength is intact just limited by pain Specialty Comments:  No specialty comments available.  Imaging: No results found.   PMFS History: Patient Active Problem List   Diagnosis Date Noted  . Sepsis due to undetermined organism (HCC) 08/20/2022  . Cervical spondylosis with radiculopathy 02/26/2020  . SBO (small bowel obstruction) (HCC) 08/21/2017  . Trochanteric bursitis of left hip 01/18/2017  . Lateral epicondylitis, right elbow 01/18/2017  . Rheumatoid arthritis (HCC) 03/15/2016  . DJD (degenerative joint disease), cervical 03/15/2016  . DDD (degenerative disc disease), lumbar 03/15/2016  . HTN (hypertension) 03/15/2016  . Obesity 03/15/2016  . Elevated cholesterol 03/15/2016  . Neuralgia 03/15/2016  . Malignant melanoma of right side of neck (HCC) 03/15/2016  . BBB (bundle branch block) 03/15/2016  . High risk medication use 03/15/2016  . Osteoarthritis of lumbar spine 03/15/2016  . Primary osteoarthritis of both hands 03/15/2016  . Primary osteoarthritis of both knees 03/15/2016  . Mesenteric adenitis 06/29/2015   Past Medical History:  Diagnosis Date  . Arthritis   . Balance problem   . BBB (bundle branch block) 03/15/2016  . Carpal tunnel syndrome   . DDD (degenerative disc disease), lumbar 03/15/2016  . Depression   . Dysrhythmia    had some tachycardia with steroids  . Elevated cholesterol 03/15/2016  .  Headache(784.0)    Ocular Migraines - takes Ambien  for it  . Heart murmur   . HTN (hypertension) 03/15/2016  . Hyperlipemia   . Hypertension   . Malignant melanoma of right side of neck (HCC) 03/15/2016   30 years ago   . Neuralgia 03/15/2016  . Ocular migraine   . 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 Rheumatoid arthritis (HCC) 03/15/2016   Sero Negative.   SABRA RLS (restless legs syndrome)   . Sleep apnea    could not use a cpap  . Snores   . Status post gastric banding   . Wears glasses    driving    Family History  Problem Relation Age of Onset  . Heart disease Mother   . Alzheimer's disease Father     Past Surgical History:  Procedure Laterality  Date  . ANTERIOR CERVICAL DECOMPRESSION/DISCECTOMY FUSION 4 LEVELS N/A 02/26/2020   Procedure: ANTERIOR CERVICAL DECOMPRESSION/DISCECTOMY FUSION, INTERBODY PROSTHESIS, PLATE/SCREWS CERVICAL THREE-FOUR CERVICAL FOUR-FIVE CERVICAL FIVE-SIX- CERVICAL SIX-SEVEN;  Surgeon: Mavis Purchase, MD;  Location: [facility];  Service: Neurosurgery;  Laterality: N/A;  . CARPAL TUNNEL RELEASE Right 09/26/2012   Procedure: CARPAL TUNNEL RELEASE;  Surgeon: Franky JONELLE Curia, MD;  Location: [facility];  Service: Orthopedics;  Laterality: Right;  . CARPAL TUNNEL RELEASE Left 10/24/2012   Procedure: CARPAL TUNNEL RELEASE;  Surgeon: Franky JONELLE Curia, MD;  Location: [facility];  Service: Orthopedics;  Laterality: Left;  . EXCISION MELANOMA WITH SENTINEL LYMPH NODE BIOPSY  1984   rt neck with mulpipal nodes and some muscle excised rt neck-shoulder  . KNEE ARTHROPLASTY    . knee replacement    . LAPAROSCOPIC GASTRIC BANDING  03/15/2009  . LAPAROSCOPIC GASTRIC BANDING    . MUSCLE BIOPSY  2004   rt thigh to r/o myocitis  . nerve conduction velosity test    . SHOULDER SURGERY Right 12/2021  . TONSILLECTOMY    . TOTAL KNEE ARTHROPLASTY Bilateral 12/05/2010  . TRIGGER FINGER RELEASE Right 09/26/2012   Procedure: RELEASE  TRIGGER FINGER/A-1 PULLEY RING AND SMALL ;  Surgeon: Franky JONELLE Curia, MD;  Location: [facility];  Service: Orthopedics;  Laterality: Right;  . TRIGGER FINGER RELEASE Left 10/24/2012   Procedure: RELEASE TRIGGER FINGER/A-1 PULLEY LEFT MIDDLE AND LEFT RING;  Surgeon: Franky JONELLE Curia, MD;  Location: [facility];  Service: Orthopedics;  Laterality: Left;  . UPPER GI ENDOSCOPY     Social History   Occupational History  . Not on file  Tobacco Use  . Smoking status: Former    Current packs/day: 0.00    Average packs/day: 1 pack/day for 30.0 years (30.0 ttl pk-yrs)    Types: Cigarettes    Start date: 09/24/1971    Quit date: 09/23/2001    Years since quitting: 21.6    Passive exposure: Never  . Smokeless tobacco: Never  Vaping Use  . Vaping status: Never Used  Substance and Sexual Activity  . Alcohol use: Not Currently    Comment: rare 2-3 per year  . Drug use: No  . Sexual activity: Not on file

## 2023-05-30 ENCOUNTER — Encounter: Payer: Self-pay | Admitting: Physician Assistant

## 2023-06-06 ENCOUNTER — Ambulatory Visit
Admission: RE | Admit: 2023-06-06 | Discharge: 2023-06-06 | Disposition: A | Discharge status: Home / Self Care | Payer: Medicare Other | Source: Ambulatory Visit | Attending: Physician Assistant

## 2023-06-06 DIAGNOSIS — M25511 Pain in right shoulder: Secondary | ICD-10-CM

## 2023-06-06 DIAGNOSIS — M75121 Complete rotator cuff tear or rupture of right shoulder, not specified as traumatic: Secondary | ICD-10-CM | POA: Diagnosis not present

## 2023-06-06 DIAGNOSIS — M67813 Other specified disorders of tendon, right shoulder: Secondary | ICD-10-CM | POA: Diagnosis not present

## 2023-06-18 ENCOUNTER — Other Ambulatory Visit: Payer: Self-pay | Admitting: *Deleted

## 2023-06-18 DIAGNOSIS — M0609 Rheumatoid arthritis without rheumatoid factor, multiple sites: Secondary | ICD-10-CM

## 2023-06-18 MED ORDER — METHOTREXATE SODIUM CHEMO INJECTION 50 MG/2ML: INTRAMUSCULAR | 0 refills | Status: DC

## 2023-06-18 NOTE — Telephone Encounter (Signed)
Refill request received via fax from [facility]  for Methotrexate  Last Fill: 03/26/2023  Labs: 05/08/2023 CBC and CMP are normal.   Next Visit: 11/08/2023  Last Visit: 05/28/2023  DX: Rheumatoid arthritis of multiple sites with negative rheumatoid factor   Current Dose per office note 05/28/2023: Methotrexate 1.0 ml sq injections once weekly   Okay to refill Methotrexate?

## 2023-06-20 ENCOUNTER — Encounter: Payer: Self-pay | Admitting: Orthopedic Surgery

## 2023-06-20 ENCOUNTER — Ambulatory Visit: Payer: Medicare Other | Admitting: Orthopedic Surgery

## 2023-06-20 DIAGNOSIS — M25512 Pain in left shoulder: Secondary | ICD-10-CM | POA: Diagnosis not present

## 2023-06-20 DIAGNOSIS — M25511 Pain in right shoulder: Secondary | ICD-10-CM | POA: Diagnosis not present

## 2023-06-20 NOTE — Progress Notes (Signed)
Office Visit Note   Patient: David [name]           Date of Birth: October 27, 1950           MRN: [id number] Visit Date: 06/20/2023 Requested by: [facility], [place], [place] [address] [place],  [place] [address] PCP: Genia Hotter, FNP  Subjective: Chief Complaint  Patient presents with   Right Shoulder - Follow-up   Left Shoulder - Pain    HPI: David [name] is a 73 y.o. male who presents to the office reporting bilateral shoulder pain right worse than left.  Since he was last seen has had an MRI scan of the right shoulder which is reviewed with the patient today.  That scan shows full-thickness cartilage loss of the glenohumeral joint with subchondral marrow edema along with full-thickness retracted tear of the supraspinatus and infraspinatus with about 4 cm of retraction.  Patient also reports left shoulder pain.  Has not had surgery on the left shoulder.  He does report pain and weakness on that side.  Hard for him to carry things.  He did have bilateral knee replacement done about 8 years ago.  Denies any radicular symptoms..                ROS: All systems reviewed are negative as they relate to the chief complaint within the history of present illness.  Patient denies fevers or chills.  Assessment & Plan: Visit Diagnoses:  1. Right shoulder pain, unspecified chronicity   2. Left shoulder pain, unspecified chronicity     Plan: Impression is bilateral shoulder pain right worse than left.  Does have rotator cuff arthropathy on the right-hand side with irreparable rotator cuff tendons.  Thin cut CT scan for preop planning for reverse shoulder replacement ordered.  The risk and benefits of reverse shoulder replacement are discussed with the patient include not limited to infection nerve vessel damage instability as well as incomplete pain relief and incomplete restoration of function.  The expected rehab time is also discussed.  Regarding the left shoulder I think he may have  pathology which could be corrected without replacement.  Does have a little weakness and coarseness but no superior migration of the humeral head.  Symptoms ongoing on that left-hand side for well over 3 months with failure of conservative treatment.  MRI arthrogram of the left shoulder indicated to evaluate rotator cuff tear and biceps.  Norco prescribed to be taken on a very sparing basis prior to his surgery.  He does have an important trip coming up in 3 months.  Would like to be reasonably functional for that event.  We will see what the MRI arthrogram shows on the left and we will go ahead and get the thin cut CT scan done in case he wants to proceed with reverse replacement on the right.  I think he is leaning more towards having the right 1 fixed as it would be a slightly quicker recovery than the rotator cuff surgery on the left.  Follow-Up Instructions: No follow-ups on file.   Orders:  Orders Placed This Encounter  Procedures   CT SHOULDER RIGHT WO CONTRAST   MR Shoulder Left w/ contrast   Arthrogram   No orders of the defined types were placed in this encounter.     Procedures: No procedures performed   Clinical Data: No additional findings.  Objective: Vital Signs: There were no vitals taken for this visit.  Physical Exam:  Constitutional: Patient appears  well-developed HEENT:  Head: Normocephalic Eyes:EOM are normal Neck: Normal range of motion Cardiovascular: Normal rate Pulmonary/chest: Effort normal Neurologic: Patient is alert Skin: Skin is warm Psychiatric: Patient has normal mood and affect  Ortho Exam: Ortho exam demonstrates range of motion on the right of 30/80/120.  On the left 70/95/150.  Does have some weakness with external rotation of the right at 4- out of 5 compared to the left at 5+ out of 5.  Deltoid is functional bilaterally.  Motor or sensory function of the hands intact bilaterally.  No masses lymphadenopathy or skin changes noted in either  shoulder girdle region.  He is able to get that left hand overhead.  Active forward flexion and abduction on the right is only to about 85/70.  He also has evidence of prior neck dissection done for melanoma.  Some of the upper trapezius is deficient on that side.  There is also areas of scar which may be involved in the upper portion of the incision near the coracoid.  1 small area near the sternum which has granulation tissue measuring about 2 cm in length.  That would have to be fully resolved prior to any surgical treatment on the right-hand side.  Specialty Comments:  No specialty comments available.  Imaging: No results found.   PMFS History: Patient Active Problem List   Diagnosis Date Noted   Sepsis due to undetermined organism (HCC) 08/20/2022   Cervical spondylosis with radiculopathy 02/26/2020   SBO (small bowel obstruction) (HCC) 08/21/2017   Trochanteric bursitis of left hip 01/18/2017   Lateral epicondylitis, right elbow 01/18/2017   Rheumatoid arthritis (HCC) 03/15/2016   DJD (degenerative joint disease), cervical 03/15/2016   DDD (degenerative disc disease), lumbar 03/15/2016   HTN (hypertension) 03/15/2016   Obesity 03/15/2016   Elevated cholesterol 03/15/2016   Neuralgia 03/15/2016   Malignant melanoma of right side of neck (HCC) 03/15/2016   BBB (bundle branch block) 03/15/2016   High risk medication use 03/15/2016   Osteoarthritis of lumbar spine 03/15/2016   Primary osteoarthritis of both hands 03/15/2016   Primary osteoarthritis of both knees 03/15/2016   Mesenteric adenitis 06/29/2015   Past Medical History:  Diagnosis Date   Arthritis    Balance problem    BBB (bundle branch block) 03/15/2016   Carpal tunnel syndrome    DDD (degenerative disc disease), lumbar 03/15/2016   Depression    Dysrhythmia    had some tachycardia with steroids   Elevated cholesterol 03/15/2016   Headache(784.0)    Ocular Migraines - takes Ambien for it   Heart murmur     HTN (hypertension) 03/15/2016   Hyperlipemia    Hypertension    Malignant melanoma of right side of neck (HCC) 03/15/2016   30 years ago    Neuralgia 03/15/2016   Ocular migraine    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    RLS (restless legs syndrome)    Sleep apnea    could not use a cpap   Snores    Status post gastric banding    Wears glasses    driving    Family History  Problem Relation Age of Onset   Heart disease Mother    Alzheimer's disease Father     Past Surgical History:  Procedure Laterality Date   ANTERIOR CERVICAL DECOMPRESSION/DISCECTOMY FUSION 4 LEVELS N/A 02/26/2020   Procedure: ANTERIOR CERVICAL DECOMPRESSION/DISCECTOMY FUSION, INTERBODY PROSTHESIS, PLATE/SCREWS CERVICAL THREE-FOUR CERVICAL  FOUR-FIVE CERVICAL FIVE-SIX- CERVICAL SIX-SEVEN;  Surgeon: Tressie Stalker, MD;  Location: [facility] OR;  Service: Neurosurgery;  Laterality: N/A;   CARPAL TUNNEL RELEASE Right 09/26/2012   Procedure: CARPAL TUNNEL RELEASE;  Surgeon: Tami Ribas, MD;  Location: [facility];  Service: Orthopedics;  Laterality: Right;   CARPAL TUNNEL RELEASE Left 10/24/2012   Procedure: CARPAL TUNNEL RELEASE;  Surgeon: Tami Ribas, MD;  Location: [facility];  Service: Orthopedics;  Laterality: Left;   EXCISION MELANOMA WITH SENTINEL LYMPH NODE BIOPSY  1984   rt neck with mulpipal nodes and some muscle excised rt neck-shoulder   KNEE ARTHROPLASTY     knee replacement     LAPAROSCOPIC GASTRIC BANDING  03/15/2009   LAPAROSCOPIC GASTRIC BANDING     MUSCLE BIOPSY  2004   rt thigh to r/o myocitis   nerve conduction velosity test     SHOULDER SURGERY Right 12/2021   TONSILLECTOMY     TOTAL KNEE ARTHROPLASTY Bilateral 12/05/2010   TRIGGER FINGER RELEASE Right 09/26/2012   Procedure: RELEASE TRIGGER FINGER/A-1 PULLEY RING AND SMALL ;  Surgeon: Tami Ribas, MD;  Location: [facility];   Service: Orthopedics;  Laterality: Right;   TRIGGER FINGER RELEASE Left 10/24/2012   Procedure: RELEASE TRIGGER FINGER/A-1 PULLEY LEFT MIDDLE AND LEFT RING;  Surgeon: Tami Ribas, MD;  Location: [facility];  Service: Orthopedics;  Laterality: Left;   UPPER GI ENDOSCOPY     Social History   Occupational History   Not on file  Tobacco Use   Smoking status: Former    Current packs/day: 0.00    Average packs/day: 1 pack/day for 30.0 years (30.0 ttl pk-yrs)    Types: Cigarettes    Start date: 09/24/1971    Quit date: 09/23/2001    Years since quitting: 21.7    Passive exposure: Never   Smokeless tobacco: Never  Vaping Use   Vaping status: Never Used  Substance and Sexual Activity   Alcohol use: Not Currently    Comment: rare 2-3 per year   Drug use: No   Sexual activity: Not on file

## 2023-06-21 ENCOUNTER — Encounter: Payer: Self-pay | Admitting: Orthopedic Surgery

## 2023-06-24 MED ORDER — HYDROCODONE-ACETAMINOPHEN 5-325 MG PO TABS: 1.0000 | ORAL_TABLET | Freq: Three times a day (TID) | ORAL | 0 refills | Status: DC | PRN

## 2023-06-26 ENCOUNTER — Ambulatory Visit
Admission: RE | Admit: 2023-06-26 | Discharge: 2023-06-26 | Disposition: A | Discharge status: Home / Self Care | Payer: Medicare Other | Source: Ambulatory Visit | Attending: Orthopedic Surgery | Admitting: Orthopedic Surgery

## 2023-06-26 DIAGNOSIS — M25511 Pain in right shoulder: Secondary | ICD-10-CM

## 2023-06-27 ENCOUNTER — Ambulatory Visit
Admission: RE | Admit: 2023-06-27 | Discharge: 2023-06-27 | Disposition: A | Discharge status: Home / Self Care | Payer: Medicare Other | Source: Ambulatory Visit | Attending: Orthopedic Surgery | Admitting: Orthopedic Surgery

## 2023-06-27 DIAGNOSIS — Z79899 Other long term (current) drug therapy: Secondary | ICD-10-CM | POA: Diagnosis not present

## 2023-06-27 DIAGNOSIS — M25512 Pain in left shoulder: Secondary | ICD-10-CM

## 2023-06-27 DIAGNOSIS — H35371 Puckering of macula, right eye: Secondary | ICD-10-CM | POA: Diagnosis not present

## 2023-06-27 DIAGNOSIS — H43811 Vitreous degeneration, right eye: Secondary | ICD-10-CM | POA: Diagnosis not present

## 2023-06-27 DIAGNOSIS — H25813 Combined forms of age-related cataract, bilateral: Secondary | ICD-10-CM | POA: Diagnosis not present

## 2023-06-27 DIAGNOSIS — G8929 Other chronic pain: Secondary | ICD-10-CM | POA: Diagnosis not present

## 2023-06-27 DIAGNOSIS — M75122 Complete rotator cuff tear or rupture of left shoulder, not specified as traumatic: Secondary | ICD-10-CM | POA: Diagnosis not present

## 2023-06-27 MED ORDER — IOPAMIDOL (ISOVUE-M 200) INJECTION 41%
10.0000 mL | Freq: Once | INTRAMUSCULAR | Status: AC
  Administered 2023-06-27: 10 mL via INTRA_ARTICULAR

## 2023-07-11 ENCOUNTER — Ambulatory Visit: Payer: Medicare Other | Admitting: Orthopedic Surgery

## 2023-07-25 ENCOUNTER — Other Ambulatory Visit: Payer: Self-pay

## 2023-07-25 ENCOUNTER — Ambulatory Visit: Payer: Medicare Other | Admitting: Orthopedic Surgery

## 2023-07-25 DIAGNOSIS — M75122 Complete rotator cuff tear or rupture of left shoulder, not specified as traumatic: Secondary | ICD-10-CM | POA: Diagnosis not present

## 2023-07-25 DIAGNOSIS — M25512 Pain in left shoulder: Secondary | ICD-10-CM

## 2023-07-27 ENCOUNTER — Encounter: Payer: Self-pay | Admitting: Orthopedic Surgery

## 2023-07-27 NOTE — Progress Notes (Signed)
 Office Visit Note   Patient: David [name]           Date of Birth: 11/22/1950           MRN: [id number] Visit Date: 07/25/2023 Requested by: Genia Hotter, FNP [address] [facility] [address] [place],  [place] [address] PCP: Scarlett Presto, Verda Cumins, FNP  Subjective: Chief Complaint  Patient presents with   Right Shoulder - Follow-up   Left Shoulder - Follow-up    HPI: David [name] is a 73 y.o. male who presents to the office reporting bilateral shoulder pain.  Since he was last seen has had a right shoulder CT for preop planning for reverse shoulder replacement along with left shoulder MRI scan.  Left shoulder MRI scan shows complete tear of the subscap with 3 cm of retraction along with moderate tendinosis of the biceps tendon.  There is also small full-thickness component of supraspinatus severe tendinosis.  Patient does report biceps pain on that left-hand side.  Patient going on a cruise in June..                ROS: All systems reviewed are negative as they relate to the chief complaint within the history of present illness.  Patient denies fevers or chills.  Assessment & Plan: Visit Diagnoses:  1. Left shoulder pain, unspecified chronicity     Plan: Impression is right shoulder pain and rotator cuff arthropathy with plan for reverse shoulder replacement in early March.  The risk benefits are discussed and we have gone over that with David [name] with the use of models and the importance of patient specific guides to achieve optimal implant position and longevity.  Regarding the left shoulder I think that has an operative rotator cuff pathology regarding the subscap.  I think the biceps tendon may be giving him a lot of symptoms.  We will try a glenohumeral joint injection in that left shoulder at this time.  He will be posted for right reverse shoulder replacement as well.  Follow-up with [place] 2 weeks after his shoulder replacement.  All questions answered  Follow-Up Instructions: No follow-ups on  file.   Orders:  Orders Placed This Encounter  Procedures   US Guided Needle Placement - No Linked Charges   No orders of the defined types were placed in this encounter.     Procedures: Large Joint Inj: L glenohumeral on 07/25/2023 7:55 AM Indications: diagnostic evaluation and pain Details: 22 G 3.5 in needle, ultrasound-guided posterior approach  Arthrogram: No  Medications: 9 mL bupivacaine 0.5 %; 40 mg methylPREDNISolone acetate 40 MG/ML; 5 mL lidocaine 1 % Outcome: tolerated well, no immediate complications Procedure, treatment alternatives, risks and benefits explained, specific risks discussed. Consent was given by the patient. Immediately prior to procedure a time out was called to verify the correct patient, procedure, equipment, support staff and site/side marked as required. Patient was prepped and draped in the usual sterile fashion.       Clinical Data: No additional findings.  Objective: Vital Signs: There were no vitals taken for this visit.  Physical Exam:  Constitutional: Patient appears well-developed HEENT:  Head: Normocephalic Eyes:EOM are normal Neck: Normal range of motion Cardiovascular: Normal rate Pulmonary/chest: Effort normal Neurologic: Patient is alert Skin: Skin is warm Psychiatric: Patient has normal mood and affect  Ortho Exam: Ortho examination essentially unchanged from prior visits.  Does have some bicipital groove tenderness on the left but no Popeye deformity.  Subscap strength 4 out of 5  on the left compared to 5 out of 5 on the right.  Does have some coarseness and popping on the left-hand side with passive range of motion of that left shoulder.  Motor or sensory function of both hands intact.  Specialty Comments:  No specialty comments available.  Imaging: No results found.   PMFS History: Patient Active Problem List   Diagnosis Date Noted   Sepsis due to undetermined organism (HCC) 08/20/2022   Cervical spondylosis with  radiculopathy 02/26/2020   SBO (small bowel obstruction) (HCC) 08/21/2017   Trochanteric bursitis of left hip 01/18/2017   Lateral epicondylitis, right elbow 01/18/2017   Rheumatoid arthritis (HCC) 03/15/2016   DJD (degenerative joint disease), cervical 03/15/2016   DDD (degenerative disc disease), lumbar 03/15/2016   HTN (hypertension) 03/15/2016   Obesity 03/15/2016   Elevated cholesterol 03/15/2016   Neuralgia 03/15/2016   Malignant melanoma of right side of neck (HCC) 03/15/2016   BBB (bundle branch block) 03/15/2016   High risk medication use 03/15/2016   Osteoarthritis of lumbar spine 03/15/2016   Primary osteoarthritis of both hands 03/15/2016   Primary osteoarthritis of both knees 03/15/2016   Mesenteric adenitis 06/29/2015   Past Medical History:  Diagnosis Date   Arthritis    Balance problem    BBB (bundle branch block) 03/15/2016   Carpal tunnel syndrome    DDD (degenerative disc disease), lumbar 03/15/2016   Depression    Dysrhythmia    had some tachycardia with steroids   Elevated cholesterol 03/15/2016   Headache(784.0)    Ocular Migraines - takes Ambien for it   Heart murmur    HTN (hypertension) 03/15/2016   Hyperlipemia    Hypertension    Malignant melanoma of right side of neck (HCC) 03/15/2016   30 years ago    Neuralgia 03/15/2016   Ocular migraine    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    RLS (restless legs syndrome)    Sleep apnea    could not use a cpap   Snores    Status post gastric banding    Wears glasses    driving    Family History  Problem Relation Age of Onset   Heart disease Mother    Alzheimer's disease Father     Past Surgical History:  Procedure Laterality Date   ANTERIOR CERVICAL DECOMPRESSION/DISCECTOMY FUSION 4 LEVELS N/A 02/26/2020   Procedure: ANTERIOR CERVICAL DECOMPRESSION/DISCECTOMY FUSION, INTERBODY PROSTHESIS, PLATE/SCREWS CERVICAL THREE-FOUR  CERVICAL FOUR-FIVE CERVICAL FIVE-SIX- CERVICAL SIX-SEVEN;  Surgeon: Tressie Stalker, MD;  Location: [facility];  Service: Neurosurgery;  Laterality: N/A;   CARPAL TUNNEL RELEASE Right 09/26/2012   Procedure: CARPAL TUNNEL RELEASE;  Surgeon: Tami Ribas, MD;  Location: [facility];  Service: Orthopedics;  Laterality: Right;   CARPAL TUNNEL RELEASE Left 10/24/2012   Procedure: CARPAL TUNNEL RELEASE;  Surgeon: Tami Ribas, MD;  Location:  SURGERY CENTER;  Service: Orthopedics;  Laterality: Left;   EXCISION MELANOMA WITH SENTINEL LYMPH NODE BIOPSY  1984   rt neck with mulpipal nodes and some muscle excised rt neck-shoulder   KNEE ARTHROPLASTY     knee replacement     LAPAROSCOPIC GASTRIC BANDING  03/15/2009   LAPAROSCOPIC GASTRIC BANDING     MUSCLE BIOPSY  2004   rt thigh to r/o myocitis   nerve conduction velosity test     SHOULDER SURGERY Right 12/2021   TONSILLECTOMY     TOTAL  KNEE ARTHROPLASTY Bilateral 12/05/2010   TRIGGER FINGER RELEASE Right 09/26/2012   Procedure: RELEASE TRIGGER FINGER/A-1 PULLEY RING AND SMALL ;  Surgeon: Tami Ribas, MD;  Location: [facility];  Service: Orthopedics;  Laterality: Right;   TRIGGER FINGER RELEASE Left 10/24/2012   Procedure: RELEASE TRIGGER FINGER/A-1 PULLEY LEFT MIDDLE AND LEFT RING;  Surgeon: Tami Ribas, MD;  Location: [facility];  Service: Orthopedics;  Laterality: Left;   UPPER GI ENDOSCOPY     Social History   Occupational History   Not on file  Tobacco Use   Smoking status: Former    Current packs/day: 0.00    Average packs/day: 1 pack/day for 30.0 years (30.0 ttl pk-yrs)    Types: Cigarettes    Start date: 09/24/1971    Quit date: 09/23/2001    Years since quitting: 21.8    Passive exposure: Never   Smokeless tobacco: Never  Vaping Use   Vaping status: Never Used  Substance and Sexual Activity   Alcohol use: Not Currently    Comment: rare 2-3 per year   Drug use: No   Sexual  activity: Not on file

## 2023-07-29 MED ORDER — LIDOCAINE HCL 1 % IJ SOLN
5.0000 mL | INTRAMUSCULAR | Status: AC | PRN
  Administered 2023-07-25: 5 mL

## 2023-07-29 MED ORDER — BUPIVACAINE HCL 0.5 % IJ SOLN
9.0000 mL | INTRAMUSCULAR | Status: AC | PRN
  Administered 2023-07-25: 9 mL via INTRA_ARTICULAR

## 2023-07-29 MED ORDER — METHYLPREDNISOLONE ACETATE 40 MG/ML IJ SUSP
40.0000 mg | INTRAMUSCULAR | Status: AC | PRN
Start: 2023-07-25 — End: 2023-07-25
  Administered 2023-07-25: 40 mg via INTRA_ARTICULAR

## 2023-07-30 ENCOUNTER — Other Ambulatory Visit: Payer: Self-pay

## 2023-08-07 ENCOUNTER — Other Ambulatory Visit: Payer: Self-pay | Admitting: Physician Assistant

## 2023-08-07 DIAGNOSIS — M0609 Rheumatoid arthritis without rheumatoid factor, multiple sites: Secondary | ICD-10-CM

## 2023-08-08 ENCOUNTER — Other Ambulatory Visit: Payer: Self-pay | Admitting: *Deleted

## 2023-08-08 DIAGNOSIS — M0609 Rheumatoid arthritis without rheumatoid factor, multiple sites: Secondary | ICD-10-CM

## 2023-08-08 NOTE — Telephone Encounter (Signed)
 Last Fill: 05/09/2023  Labs: 05/08/2023 CBC and CMP are normal. I called patient, labs are due.  Next Visit: 11/08/2023  Last Visit: 05/28/2023  DX: Rheumatoid arthritis of multiple sites with negative rheumatoid factor   Current Dose per office note 05/28/2023: sulfasalazine 500 mg 2 tablets po BID.   Okay to refill Sulfasalazine?

## 2023-08-09 NOTE — Pre-Procedure Instructions (Signed)
 Surgical Instructions   Your procedure is scheduled on Tuesday, April 1st. Report to [facility] Main Entrance "A" at 05:30 A.M., then check in with the Admitting office. Any questions or running late day of surgery: call [phone]  Questions prior to your surgery date: call [phone], Monday-Friday, 8am-4pm. If you experience any cold or flu symptoms such as cough, fever, chills, shortness of breath, etc. between now and your scheduled surgery, please notify us at the above number.     Remember:  Do not eat after midnight the night before your surgery  You may drink clear liquids until 04:30 AM the morning of your surgery.   Clear liquids allowed are: Water, Non-Citrus Juices (without pulp), Carbonated Beverages, Clear Tea (no milk, honey, etc.), Black Coffee Only (NO MILK, CREAM OR POWDERED CREAMER of any kind), and Gatorade.  Patient Instructions  The night before surgery:  No food after midnight. ONLY clear liquids after midnight  The day of surgery (if you do NOT have diabetes):  Drink ONE (1) Pre-Surgery Clear Ensure by 04:30 AM the morning of surgery. Drink in one sitting. Do not sip.  This drink was given to you during your hospital  pre-op appointment visit.  Nothing else to drink after completing the  Pre-Surgery Clear Ensure.          If you have questions, please contact your surgeon's office.    Take these medicines the morning of surgery with A SIP OF WATER  DULoxetine (CYMBALTA)  gabapentin (NEURONTIN)  metoprolol succinate (TOPROL-XL)  tamsulosin [facility])    May take these medicines IF NEEDED: methocarbamol (ROBAXIN)  Povidone (IVIZIA DRY EYES OP)   Follow prescriber's instructions regarding hydroxychloroquine (PLAQUENIL) and methotrexate.  One week prior to surgery, STOP taking any Aspirin (unless otherwise instructed by your surgeon) Aleve, Naproxen, Ibuprofen, Motrin, Advil, Goody's, BC's, all herbal medications, fish oil, and non-prescription  vitamins.                     Do NOT Smoke (Tobacco/Vaping) for 24 hours prior to your procedure.  If you use a CPAP at night, you may bring your mask/headgear for your overnight stay.   You will be asked to remove any contacts, glasses, piercing's, hearing aid's, dentures/partials prior to surgery. Please bring cases for these items if needed.    Patients discharged the day of surgery will not be allowed to drive home, and someone needs to stay with them for 24 hours.  SURGICAL WAITING ROOM VISITATION Patients may have no more than 2 support people in the waiting area - these visitors may rotate.   Pre-op nurse will coordinate an appropriate time for 1 ADULT support person, who may not rotate, to accompany patient in pre-op.  Children under the age of 3 must have an adult with them who is not the patient and must remain in the main waiting area with an adult.  If the patient needs to stay at the hospital during part of their recovery, the visitor guidelines for inpatient rooms apply.  Please refer to the [facility] website for the visitor guidelines for any additional information.   If you received a COVID test during your pre-op visit  it is requested that you wear a mask when out in public, stay away from anyone that may not be feeling well and notify your surgeon if you develop symptoms. If you have been in contact with anyone that has tested positive in the last 10 days please notify you  Careers adviser.      Pre-operative 5 CHG Bathing Instructions   You can play a key role in reducing the risk of infection after surgery. Your skin needs to be as free of germs as possible. You can reduce the number of germs on your skin by washing with CHG (chlorhexidine gluconate) soap before surgery. CHG is an antiseptic soap that kills germs and continues to kill germs even after washing.   DO NOT use if you have an allergy to chlorhexidine/CHG or antibacterial soaps. If your skin becomes reddened or  irritated, stop using the CHG and notify one of our RNs at [phone].   Please shower with the CHG soap starting 4 days before surgery using the following schedule:     Please keep in mind the following:  DO NOT shave, including legs and underarms, starting the day of your first shower.   You may shave your face at any point before/day of surgery.  Place clean sheets on your bed the day you start using CHG soap. Use a clean washcloth (not used since being washed) for each shower. DO NOT sleep with pets once you start using the CHG.   CHG Shower Instructions:  Wash your face and private area with normal soap. If you choose to wash your hair, wash first with your normal shampoo.  After you use shampoo/soap, rinse your hair and body thoroughly to remove shampoo/soap residue.  Turn the water OFF and apply about 3 tablespoons (45 ml) of CHG soap to a CLEAN washcloth.  Apply CHG soap ONLY FROM YOUR NECK DOWN TO YOUR TOES (washing for 3-5 minutes)  DO NOT use CHG soap on face, private areas, open wounds, or sores.  Pay special attention to the area where your surgery is being performed.  If you are having back surgery, having someone wash your back for you may be helpful. Wait 2 minutes after CHG soap is applied, then you may rinse off the CHG soap.  Pat dry with a clean towel  Put on clean clothes/pajamas   If you choose to wear lotion, please use ONLY the CHG-compatible lotions that are listed below.  Additional instructions for the day of surgery: DO NOT APPLY any lotions, deodorants, cologne, or perfumes.   Do not bring valuables to the hospital. [facility] is not responsible for any belongings/valuables. Do not wear nail polish, gel polish, artificial nails, or any other type of covering on natural nails (fingers and toes) Do not wear jewelry or makeup Put on clean/comfortable clothes.  Please brush your teeth.  Ask your nurse before applying any prescription medications to the  skin.     CHG Compatible Lotions   Aveeno Moisturizing lotion  Cetaphil Moisturizing Cream  Cetaphil Moisturizing Lotion  Clairol Herbal Essence Moisturizing Lotion, Dry Skin  Clairol Herbal Essence Moisturizing Lotion, Extra Dry Skin  Clairol Herbal Essence Moisturizing Lotion, Normal Skin  Curel Age Defying Therapeutic Moisturizing Lotion with Alpha Hydroxy  Curel Extreme Care Body Lotion  Curel Soothing Hands Moisturizing Hand Lotion  Curel Therapeutic Moisturizing Cream, Fragrance-Free  Curel Therapeutic Moisturizing Lotion, Fragrance-Free  Curel Therapeutic Moisturizing Lotion, Original Formula  Eucerin Daily Replenishing Lotion  Eucerin Dry Skin Therapy Plus Alpha Hydroxy Crme  Eucerin Dry Skin Therapy Plus Alpha Hydroxy Lotion  Eucerin Original Crme  Eucerin Original Lotion  Eucerin Plus Crme Eucerin Plus Lotion  Eucerin TriLipid Replenishing Lotion  Keri Anti-Bacterial Hand Lotion  Keri Deep Conditioning Original Lotion Dry Skin Formula Softly Scented  Keri Deep  Conditioning Original Lotion, Fragrance Free Sensitive Skin Formula  Keri Lotion Fast Absorbing Fragrance Free Sensitive Skin Formula  Keri Lotion Fast Absorbing Softly Scented Dry Skin Formula  Keri Original Lotion  Keri Skin Renewal Lotion Keri Silky Smooth Lotion  Keri Silky Smooth Sensitive Skin Lotion  Nivea Body Creamy Conditioning Oil  Nivea Body Extra Enriched Teacher, adult education Moisturizing Lotion Nivea Crme  Nivea Skin Firming Lotion  NutraDerm 30 Skin Lotion  NutraDerm Skin Lotion  NutraDerm Therapeutic Skin Cream  NutraDerm Therapeutic Skin Lotion  ProShield Protective Hand Cream  Provon moisturizing lotion   Before surgery, you can play an important role. Because skin is not sterile, your skin needs to be as free of germs as possible. You can reduce the number of germs on your skin by using the following products.   Benzoyl Peroxide Gel  o Reduces  the number of germs present on the skin  o Applied twice a day to shoulder area starting two days before surgery   Chlorhexidine Gluconate (CHG) Soap (instructions listed above on how to wash with CHG Soap)  o An antiseptic cleaner that kills germs and bonds with the skin to continue killing germs even after washing   ==================================================================  Please follow these instructions carefully:  BENZOYL PEROXIDE 5% GEL  Please do not use if you have an allergy to benzoyl peroxide. If your skin becomes reddened/irritated stop using the benzoyl peroxide.  Starting two days before surgery, apply as follows:  1. Apply benzoyl peroxide in the morning and at night. Apply after taking a shower. If you are not taking a shower clean entire shoulder front, back, and side along with the armpit with a clean wet washcloth.  2. Place a quarter-sized dollop on your SHOULDER and rub in thoroughly, making sure to cover the front, back, and side of your shoulder, along with the armpit.   2 Days prior to Surgery First Dose on _____________ Morning Second Dose on ______________ Night  Day Before Surgery First Dose on ______________ Morning Night before surgery wash (entire body except face and private areas) with CHG Soap THEN Second Dose on ____________ Night   Morning of Surgery  wash BODY AGAIN with CHG Soap   4. Do NOT apply benzoyl peroxide gel on the day of surgery      Please read over the following fact sheets that you were given.

## 2023-08-10 ENCOUNTER — Encounter (HOSPITAL_COMMUNITY)
Admission: RE | Admit: 2023-08-10 | Discharge: 2023-08-10 | Disposition: A | Discharge status: Home / Self Care | Source: Ambulatory Visit | Attending: Orthopedic Surgery | Admitting: Orthopedic Surgery

## 2023-08-10 ENCOUNTER — Encounter (HOSPITAL_COMMUNITY): Payer: Self-pay

## 2023-08-10 ENCOUNTER — Other Ambulatory Visit: Payer: Self-pay

## 2023-08-10 VITALS — BP 112/82 | HR 65 | Temp 98.2°F | Resp 20 | Ht 68.0 in | Wt 254.7 lb

## 2023-08-10 DIAGNOSIS — I454 Nonspecific intraventricular block: Secondary | ICD-10-CM | POA: Diagnosis not present

## 2023-08-10 DIAGNOSIS — Z01818 Encounter for other preprocedural examination: Secondary | ICD-10-CM | POA: Insufficient documentation

## 2023-08-10 DIAGNOSIS — I4510 Unspecified right bundle-branch block: Secondary | ICD-10-CM | POA: Insufficient documentation

## 2023-08-10 DIAGNOSIS — Z01810 Encounter for preprocedural cardiovascular examination: Secondary | ICD-10-CM | POA: Diagnosis not present

## 2023-08-10 DIAGNOSIS — Z01812 Encounter for preprocedural laboratory examination: Secondary | ICD-10-CM | POA: Diagnosis not present

## 2023-08-10 HISTORY — DX: Barrett's esophagus without dysplasia: K22.70

## 2023-08-10 HISTORY — DX: Unspecified osteoarthritis, unspecified site: M19.90

## 2023-08-10 LAB — URINALYSIS, W/ REFLEX TO CULTURE (INFECTION SUSPECTED)
Bilirubin Urine: NEGATIVE
Glucose, UA: 100 mg/dL — AB
Hgb urine dipstick: NEGATIVE
Ketones, ur: NEGATIVE mg/dL
Leukocytes,Ua: NEGATIVE
Nitrite: NEGATIVE
Protein, ur: NEGATIVE mg/dL
RBC / HPF: NONE SEEN RBC/hpf (ref 0–5)
Specific Gravity, Urine: >1.03 — ABNORMAL HIGH (ref 1.005–1.030)
pH: 6 (ref 5.0–8.0)

## 2023-08-10 LAB — CBC
HCT: 44.3 % (ref 39.0–52.0)
Hemoglobin: 14.5 g/dL (ref 13.0–17.0)
MCH: 31.4 pg (ref 26.0–34.0)
MCHC: 32.7 g/dL (ref 30.0–36.0)
MCV: 95.9 fL (ref 80.0–100.0)
Platelets: 250 10*3/uL (ref 150–400)
RBC: 4.62 MIL/uL (ref 4.22–5.81)
RDW: 14.7 % (ref 11.5–15.5)
WBC: 8.4 10*3/uL (ref 4.0–10.5)
nRBC: 0 % (ref 0.0–0.2)

## 2023-08-10 LAB — BASIC METABOLIC PANEL
Anion gap: 8 (ref 5–15)
BUN: 16 mg/dL (ref 8–23)
CO2: 30 mmol/L (ref 22–32)
Calcium: 10 mg/dL (ref 8.9–10.3)
Chloride: 102 mmol/L (ref 98–111)
Creatinine, Ser: 1.02 mg/dL (ref 0.61–1.24)
GFR, Estimated: >60 mL/min (ref >60)
Glucose, Bld: 107 mg/dL — ABNORMAL HIGH (ref 70–99)
Potassium: 4.9 mmol/L (ref 3.5–5.1)
Sodium: 140 mmol/L (ref 135–145)

## 2023-08-10 LAB — SURGICAL PCR SCREEN
MRSA, PCR: NEGATIVE
Staphylococcus aureus: POSITIVE — AB

## 2023-08-10 NOTE — Progress Notes (Signed)
 PCP - Kae Heller, FNP Cardiologist - denies  PPM/ICD - denies   Chest x-ray - 08/21/17 EKG - 08/10/23 Stress Test - 2012, per pt ECHO - 2010, per pt Cardiac Cath - denies  Sleep Study - OSA+ CPAP - nightly, unsure of pressure settings  DM- denies  Last dose of GLP1 agonist-  n/a   ASA/Blood Thinner Instructions: n/a   ERAS Protcol - yes PRE-SURGERY Ensure given at PAT  COVID TEST- n/a   Anesthesia review: yes, abnormal EKG  Patient denies shortness of breath, fever, cough and chest pain at PAT appointment   All instructions explained to the patient, with a verbal understanding of the material. Patient agrees to go over the instructions while at home for a better understanding.  The opportunity to ask questions was provided.

## 2023-08-13 ENCOUNTER — Encounter: Payer: Self-pay | Admitting: Orthopedic Surgery

## 2023-08-13 NOTE — Telephone Encounter (Signed)
 Labs okay.  Glucose a little bit high in blood at 107 and he did have a little bit in his urine but not enough to cancel surgery.  I think it would be good next time he sees his primary care provider to discuss.  Thanks please call

## 2023-08-13 NOTE — Anesthesia Preprocedure Evaluation (Addendum)
 Anesthesia Evaluation  Patient identified by MRN, date of birth, ID band Patient awake    Reviewed: Allergy & Precautions, NPO status , Patient's Chart, lab work & pertinent test results, reviewed documented beta blocker date and time   Airway Mallampati: II  TM Distance: >3 FB Neck ROM: Limited    Dental  (+) Teeth Intact, Dental Advisory Given   Pulmonary sleep apnea and Continuous Positive Airway Pressure Ventilation , former smoker   Pulmonary exam normal breath sounds clear to auscultation       Cardiovascular hypertension, Pt. on home beta blockers Normal cardiovascular exam+ dysrhythmias  Rhythm:Regular Rate:Normal     Neuro/Psych  Headaches PSYCHIATRIC DISORDERS  Depression       GI/Hepatic negative GI ROS, Neg liver ROS,,,  Endo/Other  negative endocrine ROS    Renal/GU negative Renal ROS     Musculoskeletal  (+) Arthritis , Rheumatoid disorders,    Abdominal   Peds  Hematology negative hematology ROS (+)   Anesthesia Other Findings Day of surgery medications reviewed with the patient.  Reproductive/Obstetrics                             Anesthesia Physical Anesthesia Plan  ASA: 3  Anesthesia Plan: General   Post-op Pain Management: Tylenol PO (pre-op)* and Regional block*   Induction: Intravenous  PONV Risk Score and Plan: 2 and Dexamethasone and Ondansetron  Airway Management Planned: Oral ETT  Additional Equipment:   Intra-op Plan:   Post-operative Plan: Extubation in OR  Informed Consent: I have reviewed the patients History and Physical, chart, labs and discussed the procedure including the risks, benefits and alternatives for the proposed anesthesia with the patient or authorized representative who has indicated his/her understanding and acceptance.     Dental advisory given  Plan Discussed with: CRNA  Anesthesia Plan Comments: (Reviewed. PMH of HTN, RBBB,  OSA (unable to tolerate CPAP), RLS, migraine, s/p gastric band, RA on methotrexate, sulfasalazine, and Plaquenil, arthritis, depression. )        Anesthesia Quick Evaluation

## 2023-08-20 DIAGNOSIS — G4733 Obstructive sleep apnea (adult) (pediatric): Secondary | ICD-10-CM | POA: Diagnosis not present

## 2023-08-21 ENCOUNTER — Observation Stay (HOSPITAL_COMMUNITY): Payer: Self-pay | Admitting: Anesthesiology

## 2023-08-21 ENCOUNTER — Observation Stay (HOSPITAL_COMMUNITY)

## 2023-08-21 ENCOUNTER — Other Ambulatory Visit: Payer: Self-pay

## 2023-08-21 ENCOUNTER — Observation Stay (HOSPITAL_COMMUNITY)
Admission: RE | Admit: 2023-08-21 | Discharge: 2023-08-22 | Disposition: A | Discharge status: Home / Self Care | Attending: Orthopedic Surgery | Admitting: Orthopedic Surgery

## 2023-08-21 ENCOUNTER — Encounter (HOSPITAL_COMMUNITY)
Admission: RE | Disposition: A | Discharge status: Home / Self Care | Payer: Self-pay | Source: Home / Self Care | Attending: Orthopedic Surgery

## 2023-08-21 ENCOUNTER — Observation Stay (HOSPITAL_COMMUNITY): Payer: Self-pay | Admitting: Medical

## 2023-08-21 ENCOUNTER — Encounter (HOSPITAL_COMMUNITY): Payer: Self-pay | Admitting: Orthopedic Surgery

## 2023-08-21 DIAGNOSIS — M12811 Other specific arthropathies, not elsewhere classified, right shoulder: Secondary | ICD-10-CM

## 2023-08-21 DIAGNOSIS — G4733 Obstructive sleep apnea (adult) (pediatric): Secondary | ICD-10-CM

## 2023-08-21 DIAGNOSIS — Z87891 Personal history of nicotine dependence: Secondary | ICD-10-CM | POA: Diagnosis not present

## 2023-08-21 DIAGNOSIS — M7989 Other specified soft tissue disorders: Secondary | ICD-10-CM | POA: Diagnosis not present

## 2023-08-21 DIAGNOSIS — M75101 Unspecified rotator cuff tear or rupture of right shoulder, not specified as traumatic: Secondary | ICD-10-CM | POA: Diagnosis not present

## 2023-08-21 DIAGNOSIS — I10 Essential (primary) hypertension: Secondary | ICD-10-CM | POA: Insufficient documentation

## 2023-08-21 DIAGNOSIS — Z79899 Other long term (current) drug therapy: Secondary | ICD-10-CM | POA: Diagnosis not present

## 2023-08-21 DIAGNOSIS — M12819 Other specific arthropathies, not elsewhere classified, unspecified shoulder: Secondary | ICD-10-CM | POA: Diagnosis present

## 2023-08-21 DIAGNOSIS — Z85828 Personal history of other malignant neoplasm of skin: Secondary | ICD-10-CM | POA: Diagnosis not present

## 2023-08-21 DIAGNOSIS — Z96611 Presence of right artificial shoulder joint: Secondary | ICD-10-CM

## 2023-08-21 DIAGNOSIS — Z96653 Presence of artificial knee joint, bilateral: Secondary | ICD-10-CM | POA: Diagnosis not present

## 2023-08-21 DIAGNOSIS — M19011 Primary osteoarthritis, right shoulder: Secondary | ICD-10-CM | POA: Diagnosis not present

## 2023-08-21 DIAGNOSIS — G8918 Other acute postprocedural pain: Secondary | ICD-10-CM | POA: Diagnosis not present

## 2023-08-21 DIAGNOSIS — Z01818 Encounter for other preprocedural examination: Principal | ICD-10-CM

## 2023-08-21 HISTORY — PX: REVERSE SHOULDER ARTHROPLASTY: SHX5054

## 2023-08-21 SURGERY — ARTHROPLASTY, SHOULDER, TOTAL, REVERSE
Anesthesia: General | Site: Shoulder | Laterality: Right

## 2023-08-21 MED ORDER — VASOPRESSIN 20 UNIT/ML IV SOLN
INTRAVENOUS | Status: AC
  Filled 2023-08-21: qty 1

## 2023-08-21 MED ORDER — TRAZODONE HCL 50 MG PO TABS
50.0000 mg | ORAL_TABLET | Freq: Every day | ORAL | Status: DC
  Administered 2023-08-21: 50 mg via ORAL
  Filled 2023-08-21: qty 1

## 2023-08-21 MED ORDER — CEFAZOLIN SODIUM-DEXTROSE 2-4 GM/100ML-% IV SOLN
2.0000 g | Freq: Three times a day (TID) | INTRAVENOUS | Status: AC
  Administered 2023-08-21 – 2023-08-22 (×3): 2 g via INTRAVENOUS
  Filled 2023-08-21 (×3): qty 100

## 2023-08-21 MED ORDER — METOCLOPRAMIDE HCL 5 MG/ML IJ SOLN: 5.0000 mg | Freq: Three times a day (TID) | INTRAMUSCULAR | Status: DC | PRN

## 2023-08-21 MED ORDER — DEXAMETHASONE SODIUM PHOSPHATE 10 MG/ML IJ SOLN
INTRAMUSCULAR | Status: DC | PRN
  Administered 2023-08-21: 10 mg via INTRAVENOUS

## 2023-08-21 MED ORDER — MORPHINE SULFATE (PF) 4 MG/ML IV SOLN
INTRAVENOUS | Status: AC
  Filled 2023-08-21: qty 2

## 2023-08-21 MED ORDER — FENTANYL CITRATE (PF) 100 MCG/2ML IJ SOLN: 25.0000 ug | INTRAMUSCULAR | Status: DC | PRN

## 2023-08-21 MED ORDER — ACETAMINOPHEN 325 MG PO TABS: 325.0000 mg | ORAL_TABLET | Freq: Four times a day (QID) | ORAL | Status: DC | PRN

## 2023-08-21 MED ORDER — VANCOMYCIN HCL 1000 MG IV SOLR
INTRAVENOUS | Status: DC | PRN
  Administered 2023-08-21: 1000 mg

## 2023-08-21 MED ORDER — ALBUMIN HUMAN 5 % IV SOLN: INTRAVENOUS | Status: DC | PRN

## 2023-08-21 MED ORDER — ORAL CARE MOUTH RINSE: 15.0000 mL | Freq: Once | OROMUCOSAL | Status: AC

## 2023-08-21 MED ORDER — ONDANSETRON HCL 4 MG PO TABS: 4.0000 mg | ORAL_TABLET | Freq: Four times a day (QID) | ORAL | Status: DC | PRN

## 2023-08-21 MED ORDER — GABAPENTIN 300 MG PO CAPS
600.0000 mg | ORAL_CAPSULE | Freq: Every day | ORAL | Status: DC
  Administered 2023-08-22: 600 mg via ORAL
  Filled 2023-08-21: qty 2

## 2023-08-21 MED ORDER — GABAPENTIN 400 MG PO CAPS
1200.0000 mg | ORAL_CAPSULE | Freq: Every day | ORAL | Status: DC
  Administered 2023-08-21: 1200 mg via ORAL
  Filled 2023-08-21: qty 3

## 2023-08-21 MED ORDER — MIDAZOLAM HCL 2 MG/2ML IJ SOLN
INTRAMUSCULAR | Status: DC | PRN
Start: 2023-08-21 — End: 2023-08-21
  Administered 2023-08-21: 1 mg via INTRAVENOUS

## 2023-08-21 MED ORDER — GLYCOPYRROLATE PF 0.2 MG/ML IJ SOSY
PREFILLED_SYRINGE | INTRAMUSCULAR | Status: DC | PRN
Start: 2023-08-21 — End: 2023-08-21
  Administered 2023-08-21: .2 mg via INTRAVENOUS

## 2023-08-21 MED ORDER — PROPOFOL 10 MG/ML IV BOLUS
INTRAVENOUS | Status: AC
  Filled 2023-08-21: qty 20

## 2023-08-21 MED ORDER — METHOCARBAMOL 500 MG PO TABS
500.0000 mg | ORAL_TABLET | Freq: Four times a day (QID) | ORAL | Status: DC | PRN
  Administered 2023-08-22: 500 mg via ORAL
  Filled 2023-08-21: qty 1

## 2023-08-21 MED ORDER — ROCURONIUM BROMIDE 10 MG/ML (PF) SYRINGE
PREFILLED_SYRINGE | INTRAVENOUS | Status: AC
  Filled 2023-08-21: qty 10

## 2023-08-21 MED ORDER — HYDROXYCHLOROQUINE SULFATE 200 MG PO TABS
200.0000 mg | ORAL_TABLET | Freq: Two times a day (BID) | ORAL | Status: DC
  Administered 2023-08-21 – 2023-08-22 (×3): 200 mg via ORAL
  Filled 2023-08-21 (×3): qty 1

## 2023-08-21 MED ORDER — PHENYLEPHRINE 80 MCG/ML (10ML) SYRINGE FOR IV PUSH (FOR BLOOD PRESSURE SUPPORT)
PREFILLED_SYRINGE | INTRAVENOUS | Status: AC
  Filled 2023-08-21: qty 10

## 2023-08-21 MED ORDER — PHENYLEPHRINE 80 MCG/ML (10ML) SYRINGE FOR IV PUSH (FOR BLOOD PRESSURE SUPPORT)
PREFILLED_SYRINGE | INTRAVENOUS | Status: DC | PRN
  Administered 2023-08-21 (×11): 160 ug via INTRAVENOUS

## 2023-08-21 MED ORDER — SUGAMMADEX SODIUM 200 MG/2ML IV SOLN
INTRAVENOUS | Status: DC | PRN
  Administered 2023-08-21: 200 mg via INTRAVENOUS

## 2023-08-21 MED ORDER — TAMSULOSIN HCL 0.4 MG PO CAPS
0.4000 mg | ORAL_CAPSULE | Freq: Every day | ORAL | Status: DC
  Administered 2023-08-22: 0.4 mg via ORAL
  Filled 2023-08-21: qty 1

## 2023-08-21 MED ORDER — EPHEDRINE 5 MG/ML INJ
INTRAVENOUS | Status: AC
  Filled 2023-08-21: qty 5

## 2023-08-21 MED ORDER — VASOPRESSIN 20 UNIT/ML IV SOLN
INTRAVENOUS | Status: DC | PRN
  Administered 2023-08-21: 1 [IU] via INTRAVENOUS
  Administered 2023-08-21 (×2): 2 [IU] via INTRAVENOUS
  Administered 2023-08-21 (×4): 1 [IU] via INTRAVENOUS

## 2023-08-21 MED ORDER — CLONIDINE HCL (ANALGESIA) 100 MCG/ML EP SOLN
EPIDURAL | Status: AC
  Filled 2023-08-21: qty 10

## 2023-08-21 MED ORDER — POVIDONE-IODINE 10 % EX SWAB
2.0000 | Freq: Once | CUTANEOUS | Status: AC
  Administered 2023-08-21: 2 via TOPICAL

## 2023-08-21 MED ORDER — EPHEDRINE SULFATE-NACL 50-0.9 MG/10ML-% IV SOSY
PREFILLED_SYRINGE | INTRAVENOUS | Status: DC | PRN
  Administered 2023-08-21 (×3): 10 mg via INTRAVENOUS

## 2023-08-21 MED ORDER — VANCOMYCIN HCL 1000 MG IV SOLR
INTRAVENOUS | Status: AC
  Filled 2023-08-21: qty 20

## 2023-08-21 MED ORDER — TRANEXAMIC ACID-NACL 1000-0.7 MG/100ML-% IV SOLN
1000.0000 mg | INTRAVENOUS | Status: AC
  Administered 2023-08-21: 1000 mg via INTRAVENOUS
  Filled 2023-08-21: qty 100

## 2023-08-21 MED ORDER — LACTATED RINGERS IV SOLN: INTRAVENOUS | Status: DC

## 2023-08-21 MED ORDER — HYDROMORPHONE HCL 1 MG/ML IJ SOLN
0.5000 mg | INTRAMUSCULAR | Status: DC | PRN
  Filled 2023-08-21: qty 0.5

## 2023-08-21 MED ORDER — ONDANSETRON HCL 4 MG/2ML IJ SOLN: 4.0000 mg | Freq: Once | INTRAMUSCULAR | Status: DC | PRN

## 2023-08-21 MED ORDER — FENTANYL CITRATE (PF) 250 MCG/5ML IJ SOLN
INTRAMUSCULAR | Status: AC
  Filled 2023-08-21: qty 5

## 2023-08-21 MED ORDER — LIDOCAINE 2% (20 MG/ML) 5 ML SYRINGE
INTRAMUSCULAR | Status: AC
  Filled 2023-08-21: qty 5

## 2023-08-21 MED ORDER — 0.9 % SODIUM CHLORIDE (POUR BTL) OPTIME
TOPICAL | Status: DC | PRN
  Administered 2023-08-21 (×5): 1000 mL

## 2023-08-21 MED ORDER — ACETAMINOPHEN 500 MG PO TABS
1000.0000 mg | ORAL_TABLET | Freq: Once | ORAL | Status: AC
  Administered 2023-08-21: 1000 mg via ORAL
  Filled 2023-08-21: qty 2

## 2023-08-21 MED ORDER — BUPIVACAINE-EPINEPHRINE (PF) 0.25% -1:200000 IJ SOLN
INTRAMUSCULAR | Status: AC
  Filled 2023-08-21: qty 30

## 2023-08-21 MED ORDER — MIDAZOLAM HCL 2 MG/2ML IJ SOLN
INTRAMUSCULAR | Status: AC
  Filled 2023-08-21: qty 2

## 2023-08-21 MED ORDER — OXYCODONE HCL 5 MG PO TABS
5.0000 mg | ORAL_TABLET | ORAL | Status: DC | PRN
  Administered 2023-08-21: 5 mg via ORAL
  Administered 2023-08-22: 10 mg via ORAL
  Administered 2023-08-22 (×2): 5 mg via ORAL
  Filled 2023-08-21 (×2): qty 1
  Filled 2023-08-21: qty 2

## 2023-08-21 MED ORDER — CELECOXIB 100 MG PO CAPS
100.0000 mg | ORAL_CAPSULE | Freq: Two times a day (BID) | ORAL | Status: DC
  Filled 2023-08-21 (×3): qty 1

## 2023-08-21 MED ORDER — PHENOL 1.4 % MT LIQD: 1.0000 | OROMUCOSAL | Status: DC | PRN

## 2023-08-21 MED ORDER — METHOCARBAMOL 1000 MG/10ML IJ SOLN: 500.0000 mg | Freq: Four times a day (QID) | INTRAMUSCULAR | Status: DC | PRN

## 2023-08-21 MED ORDER — DULOXETINE HCL 60 MG PO CPEP
60.0000 mg | ORAL_CAPSULE | Freq: Two times a day (BID) | ORAL | Status: DC
  Administered 2023-08-21 – 2023-08-22 (×2): 60 mg via ORAL
  Filled 2023-08-21 (×2): qty 1

## 2023-08-21 MED ORDER — FENTANYL CITRATE (PF) 250 MCG/5ML IJ SOLN
INTRAMUSCULAR | Status: DC | PRN
Start: 2023-08-21 — End: 2023-08-21
  Administered 2023-08-21 (×2): 50 ug via INTRAVENOUS

## 2023-08-21 MED ORDER — TRANEXAMIC ACID-NACL 1000-0.7 MG/100ML-% IV SOLN
INTRAVENOUS | Status: AC
  Filled 2023-08-21: qty 100

## 2023-08-21 MED ORDER — CHLORHEXIDINE GLUCONATE 0.12 % MT SOLN
15.0000 mL | Freq: Once | OROMUCOSAL | Status: AC
  Administered 2023-08-21: 15 mL via OROMUCOSAL
  Filled 2023-08-21: qty 15

## 2023-08-21 MED ORDER — ONDANSETRON HCL 4 MG/2ML IJ SOLN
INTRAMUSCULAR | Status: DC | PRN
  Administered 2023-08-21: 4 mg via INTRAVENOUS

## 2023-08-21 MED ORDER — METOPROLOL SUCCINATE ER 25 MG PO TB24
25.0000 mg | ORAL_TABLET | Freq: Every day | ORAL | Status: DC
  Administered 2023-08-22: 25 mg via ORAL
  Filled 2023-08-21: qty 1

## 2023-08-21 MED ORDER — GLYCOPYRROLATE PF 0.2 MG/ML IJ SOSY
PREFILLED_SYRINGE | INTRAMUSCULAR | Status: AC
  Filled 2023-08-21: qty 1

## 2023-08-21 MED ORDER — BUPIVACAINE HCL (PF) 0.5 % IJ SOLN
INTRAMUSCULAR | Status: DC | PRN
  Administered 2023-08-21: 10 mL via PERINEURAL

## 2023-08-21 MED ORDER — METOCLOPRAMIDE HCL 5 MG PO TABS: 5.0000 mg | ORAL_TABLET | Freq: Three times a day (TID) | ORAL | Status: DC | PRN

## 2023-08-21 MED ORDER — PHENYLEPHRINE HCL-NACL 20-0.9 MG/250ML-% IV SOLN
INTRAVENOUS | Status: DC | PRN
Start: 2023-08-21 — End: 2023-08-21
  Administered 2023-08-21: 30 ug/min via INTRAVENOUS

## 2023-08-21 MED ORDER — BUPIVACAINE LIPOSOME 1.3 % IJ SUSP
INTRAMUSCULAR | Status: DC | PRN
Start: 2023-08-21 — End: 2023-08-21
  Administered 2023-08-21: 10 mL via PERINEURAL

## 2023-08-21 MED ORDER — ACETAMINOPHEN 500 MG PO TABS
1000.0000 mg | ORAL_TABLET | Freq: Four times a day (QID) | ORAL | Status: AC
Start: 2023-08-21 — End: 2023-08-22
  Administered 2023-08-21 – 2023-08-22 (×3): 1000 mg via ORAL
  Filled 2023-08-21 (×3): qty 2

## 2023-08-21 MED ORDER — HYDROCHLOROTHIAZIDE 25 MG PO TABS
25.0000 mg | ORAL_TABLET | Freq: Every day | ORAL | Status: DC
  Administered 2023-08-21 – 2023-08-22 (×2): 25 mg via ORAL
  Filled 2023-08-21 (×2): qty 1

## 2023-08-21 MED ORDER — IRRISEPT - 450ML BOTTLE WITH 0.05% CHG IN STERILE WATER, USP 99.95% OPTIME
TOPICAL | Status: DC | PRN
  Administered 2023-08-21: 450 mL

## 2023-08-21 MED ORDER — DEXAMETHASONE SODIUM PHOSPHATE 10 MG/ML IJ SOLN
INTRAMUSCULAR | Status: AC
  Filled 2023-08-21: qty 1

## 2023-08-21 MED ORDER — LIDOCAINE 2% (20 MG/ML) 5 ML SYRINGE
INTRAMUSCULAR | Status: DC | PRN
  Administered 2023-08-21: 100 mg via INTRAVENOUS

## 2023-08-21 MED ORDER — OXYCODONE HCL 5 MG PO TABS
ORAL_TABLET | ORAL | Status: AC
  Filled 2023-08-21: qty 1

## 2023-08-21 MED ORDER — ROCURONIUM BROMIDE 10 MG/ML (PF) SYRINGE
PREFILLED_SYRINGE | INTRAVENOUS | Status: DC | PRN
Start: 2023-08-21 — End: 2023-08-21
  Administered 2023-08-21: 50 mg via INTRAVENOUS

## 2023-08-21 MED ORDER — PROPOFOL 10 MG/ML IV BOLUS
INTRAVENOUS | Status: DC | PRN
  Administered 2023-08-21: 130 mg via INTRAVENOUS

## 2023-08-21 MED ORDER — DOCUSATE SODIUM 100 MG PO CAPS
100.0000 mg | ORAL_CAPSULE | Freq: Two times a day (BID) | ORAL | Status: DC
  Administered 2023-08-21 – 2023-08-22 (×3): 100 mg via ORAL
  Filled 2023-08-21 (×3): qty 1

## 2023-08-21 MED ORDER — ROPINIROLE HCL 1 MG PO TABS
4.0000 mg | ORAL_TABLET | Freq: Every day | ORAL | Status: DC
  Administered 2023-08-21: 4 mg via ORAL
  Filled 2023-08-21: qty 8

## 2023-08-21 MED ORDER — ASPIRIN 81 MG PO TBEC
81.0000 mg | DELAYED_RELEASE_TABLET | Freq: Every day | ORAL | Status: DC
  Administered 2023-08-22: 81 mg via ORAL
  Filled 2023-08-21: qty 1

## 2023-08-21 MED ORDER — POVIDONE-IODINE 7.5 % EX SOLN
Freq: Once | CUTANEOUS | Status: DC
  Filled 2023-08-21: qty 118

## 2023-08-21 MED ORDER — CEFAZOLIN SODIUM-DEXTROSE 2-4 GM/100ML-% IV SOLN
2.0000 g | INTRAVENOUS | Status: AC
  Administered 2023-08-21: 2 g via INTRAVENOUS
  Filled 2023-08-21: qty 100

## 2023-08-21 MED ORDER — ONDANSETRON HCL 4 MG/2ML IJ SOLN
INTRAMUSCULAR | Status: AC
  Filled 2023-08-21: qty 2

## 2023-08-21 MED ORDER — MENTHOL 3 MG MT LOZG: 1.0000 | LOZENGE | OROMUCOSAL | Status: DC | PRN

## 2023-08-21 SURGICAL SUPPLY — 66 items
ALCOHOL 70% 16 OZ (MISCELLANEOUS) ×1 IMPLANT
AUG COMP REV MI TAPER ADAPTER (Joint) ×1 IMPLANT
AUGMENT COMP REV MI TAPR ADPTR (Joint) IMPLANT
BAG COUNTER SPONGE SURGICOUNT (BAG) ×1 IMPLANT
BENZOIN TINCTURE PRP APPL 2/3 (GAUZE/BANDAGES/DRESSINGS) IMPLANT
BIT DRILL 1.5X85 (BIT) IMPLANT
BIT DRILL 2.7 W/STOP DISP (BIT) IMPLANT
BIT DRILL QUICK REL 1/8 2PK SL (BIT) IMPLANT
BIT DRILL TWIST 2.7 (BIT) IMPLANT
BLADE SAW SGTL 13X75X1.27 (BLADE) ×1 IMPLANT
CHLORAPREP W/TINT 26 (MISCELLANEOUS) ×1 IMPLANT
COOLER ICEMAN CLASSIC (MISCELLANEOUS) ×1 IMPLANT
COVER SURGICAL LIGHT HANDLE (MISCELLANEOUS) ×1 IMPLANT
DRAPE INCISE IOBAN 66X45 STRL (DRAPES) ×1 IMPLANT
DRAPE U-SHAPE 47X51 STRL (DRAPES) ×2 IMPLANT
DRSG AQUACEL AG ADV 3.5X10 (GAUZE/BANDAGES/DRESSINGS) ×1 IMPLANT
ELECT BLADE 4.0 EZ CLEAN MEGAD (MISCELLANEOUS) ×1 IMPLANT
ELECT REM PT RETURN 9FT ADLT (ELECTROSURGICAL) ×1 IMPLANT
ELECTRODE BLDE 4.0 EZ CLN MEGD (MISCELLANEOUS) ×1 IMPLANT
ELECTRODE REM PT RTRN 9FT ADLT (ELECTROSURGICAL) ×1 IMPLANT
GAUZE SPONGE 4X4 12PLY STRL LF (GAUZE/BANDAGES/DRESSINGS) ×1 IMPLANT
GLENOID SPHERE 36+6 (Joint) IMPLANT
GLOVE BIOGEL PI IND STRL 6.5 (GLOVE) ×1 IMPLANT
GLOVE BIOGEL PI IND STRL 8 (GLOVE) ×1 IMPLANT
GLOVE ECLIPSE 6.5 STRL STRAW (GLOVE) ×1 IMPLANT
GLOVE ECLIPSE 8.0 STRL XLNG CF (GLOVE) ×1 IMPLANT
GOWN STRL REUS W/ TWL LRG LVL3 (GOWN DISPOSABLE) ×1 IMPLANT
GOWN STRL REUS W/ TWL XL LVL3 (GOWN DISPOSABLE) ×1 IMPLANT
GUIDE BONE RSA SHLD ROT RT (ORTHOPEDIC DISPOSABLE SUPPLIES) IMPLANT
HYDROGEN PEROXIDE 16OZ (MISCELLANEOUS) ×1 IMPLANT
JET LAVAGE IRRISEPT WOUND (IRRIGATION / IRRIGATOR) ×1 IMPLANT
KIT BASIN OR (CUSTOM PROCEDURE TRAY) ×1 IMPLANT
KIT TURNOVER KIT B (KITS) ×1 IMPLANT
LAVAGE JET IRRISEPT WOUND (IRRIGATION / IRRIGATOR) ×1 IMPLANT
MANIFOLD NEPTUNE II (INSTRUMENTS) ×1 IMPLANT
NDL SUT 6 .5 CRC .975X.05 MAYO (NEEDLE) IMPLANT
NS IRRIG 1000ML POUR BTL (IV SOLUTION) ×1 IMPLANT
PACK SHOULDER (CUSTOM PROCEDURE TRAY) ×1 IMPLANT
PAD COLD SHLDR WRAP-ON (PAD) ×1 IMPLANT
PIN THREADED REVERSE (PIN) IMPLANT
REAMER GUIDE BUSHING SURG DISP (MISCELLANEOUS) IMPLANT
REAMER GUIDE W/SCREW AUG (MISCELLANEOUS) IMPLANT
RESTRAINT HEAD UNIVERSAL NS (MISCELLANEOUS) ×1 IMPLANT
RETRIEVER SUT HEWSON (MISCELLANEOUS) ×1 IMPLANT
SCREW BONE LOCKING 4.75X30X3.5 (Screw) IMPLANT
SCREW BONE STRL 6.5MMX30MM (Screw) IMPLANT
SCREW LOCKING 4.75MMX15MM (Screw) IMPLANT
SCREW LOCKING NS 4.75MMX20MM (Screw) IMPLANT
SLING ARM IMMOBILIZER LRG (SOFTGOODS) ×1 IMPLANT
SLING ARM IMMOBILIZER XL (CAST SUPPLIES) IMPLANT
SOL PREP POV-IOD 4OZ 10% (MISCELLANEOUS) ×1 IMPLANT
SPONGE T-LAP 18X18 ~~LOC~~+RFID (SPONGE) ×1 IMPLANT
STEM HUMERAL STRL 12MMX83MM (Stem) IMPLANT
STRIP CLOSURE SKIN 1/2X4 (GAUZE/BANDAGES/DRESSINGS) ×1 IMPLANT
SUCTION TUBE FRAZIER 10FR DISP (SUCTIONS) ×1 IMPLANT
SUT BROADBAND TAPE 2PK 1.5 (SUTURE) IMPLANT
SUT MNCRL AB 3-0 PS2 18 (SUTURE) ×1 IMPLANT
SUT SILK 2 0 TIES 10X30 (SUTURE) ×1 IMPLANT
SUT VIC AB 0 CT1 27XBRD ANBCTR (SUTURE) ×4 IMPLANT
SUT VIC AB 1 CT1 27XBRD ANBCTR (SUTURE) ×2 IMPLANT
SUT VIC AB 2-0 CT2 27 (SUTURE) ×3 IMPLANT
SUT VICRYL 0 UR6 27IN ABS (SUTURE) ×2 IMPLANT
TOWEL GREEN STERILE (TOWEL DISPOSABLE) ×1 IMPLANT
TRAY HUM REV SHOULDER 36 +3 (Shoulder) IMPLANT
TRAY HUM REV SHOULDER STD +6 (Shoulder) IMPLANT
WATER STERILE IRR 1000ML POUR (IV SOLUTION) ×1 IMPLANT

## 2023-08-21 NOTE — Brief Op Note (Signed)
   08/21/2023  11:05 AM  PATIENT:  David [name]  73 y.o. male  PRE-OPERATIVE DIAGNOSIS:  right rotator cuff arthropathy  POST-OPERATIVE DIAGNOSIS:  right rotator cuff arthropathy  PROCEDURE:  Procedure(s): ARTHROPLASTY, SHOULDER, TOTAL, REVERSE  SURGEON:  Surgeon(s): August Saucer, Corrie Mckusick, MD  ASSISTANT: magnant pa  ANESTHESIA:   general  EBL: 100 ml    Total I/O In: 1200 [I.V.:500; IV Piggyback:700] Out: 100 [Blood:100]  BLOOD ADMINISTERED: none  DRAINS: none   LOCAL MEDICATIONS USED:  none  SPECIMEN:  No Specimen  COUNTS:  YES  TOURNIQUET:  * No tourniquets in log *  DICTATION: .Other Dictation: Dictation Number [id number]  PLAN OF CARE: Admit for overnight observation  PATIENT DISPOSITION:  PACU - hemodynamically stable

## 2023-08-21 NOTE — Transfer of Care (Signed)
 Immediate Anesthesia Transfer of Care Note  Patient: David [name]  Procedure(s) Performed: ARTHROPLASTY, SHOULDER, TOTAL, REVERSE (Right: Shoulder)  Patient Location: PACU  Anesthesia Type:GA combined with regional for post-op pain  Level of Consciousness: awake, alert , and patient cooperative  Airway & Oxygen Therapy: Patient Spontanous Breathing and Patient connected to face mask oxygen  Post-op Assessment: Report given to RN and Post -op Vital signs reviewed and stable  Post vital signs: Reviewed and stable  Last Vitals:  Vitals Value Taken Time  BP 137/113 08/21/23 1132  Temp 36.6 C 08/21/23 1125  Pulse 73 08/21/23 1134  Resp 18 08/21/23 1134  SpO2 91 % 08/21/23 1134  Vitals shown include unfiled device data.  Last Pain:  Vitals:   08/21/23 0628  TempSrc:   PainSc: 5       Patients Stated Pain Goal: 4 (08/21/23 1610)  Complications: No notable events documented.

## 2023-08-21 NOTE — H&P (Addendum)
 David [name] is an 73 y.o. male.   Chief Complaint: right shoulder pain HPI: David [name] is a 73 y.o. male who presents  reporting bilateral shoulder pain right worse than left.  Since he was last seen has had an MRI scan of the right shoulder which is reviewed with the patient today.  That scan shows full-thickness cartilage loss of the glenohumeral joint with subchondral marrow edema along with full-thickness retracted tear of the supraspinatus and infraspinatus with about 4 cm of retraction.   Patient also reports left shoulder pain.  Has not had surgery on the left shoulder.  He does report pain and weakness on that side.  Hard for him to carry things.  He did have bilateral knee replacement done about 8 years ago.  Denies any radicular symptoms..Left shoulder MRI scan shows complete tear of the subscap with 3 cm of retraction along with moderate tendinosis of the biceps tendon.  There is also small full-thickness component of supraspinatus severe tendinosis.  Patient does report biceps pain on that left-hand side.  Patient going on a cruise in June..     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    RLS (restless legs syndrome)    Sleep apnea    uses CPAP   Snores    Status post gastric banding    Wears glasses    driving     Past Surgical History:  Procedure Laterality Date   ANTERIOR CERVICAL DECOMPRESSION/DISCECTOMY FUSION 4 LEVELS N/A 02/26/2020   Procedure: ANTERIOR CERVICAL DECOMPRESSION/DISCECTOMY FUSION, INTERBODY PROSTHESIS, PLATE/SCREWS CERVICAL THREE-FOUR CERVICAL FOUR-FIVE CERVICAL FIVE-SIX- CERVICAL SIX-SEVEN;  Surgeon: Tressie Stalker, MD;  Location: [facility] OR;  Service: Neurosurgery;  Laterality: N/A;   CARPAL TUNNEL RELEASE Right 09/26/2012   Procedure: CARPAL TUNNEL RELEASE;  Surgeon: Tami Ribas, MD;  Location: [facility];  Service: Orthopedics;  Laterality: Right;   CARPAL TUNNEL RELEASE Left 10/24/2012   Procedure: CARPAL TUNNEL RELEASE;  Surgeon: Tami Ribas, MD;  Location: [facility];  Service: Orthopedics;  Laterality: Left;   CIRCUMCISION     as a baby   EXCISION MELANOMA WITH SENTINEL LYMPH NODE BIOPSY  1984   rt neck with mulpipal nodes and some muscle excised rt neck-shoulder   KNEE ARTHROPLASTY     knee replacement     LAPAROSCOPIC GASTRIC BANDING  03/15/2009   LAPAROSCOPIC GASTRIC BANDING     MUSCLE BIOPSY  2004   rt thigh to r/o myocitis   nerve conduction velosity test     SHOULDER SURGERY Right 12/2021   TONSILLECTOMY     TOTAL KNEE ARTHROPLASTY Bilateral 12/05/2010   TRIGGER FINGER RELEASE Right 09/26/2012   Procedure: RELEASE TRIGGER  FINGER/A-1 PULLEY RING AND SMALL ;  Surgeon: Tami Ribas, MD;  Location: [facility];  Service: Orthopedics;  Laterality: Right;   TRIGGER FINGER RELEASE Left 10/24/2012   Procedure: RELEASE TRIGGER FINGER/A-1 PULLEY LEFT MIDDLE AND LEFT RING;  Surgeon: Tami Ribas, MD;  Location: [facility];  Service: Orthopedics;  Laterality: Left;   UPPER GI ENDOSCOPY      Family History  Problem Relation Age of Onset   Heart disease Mother    Alzheimer's disease Father    Social History:  reports that he quit smoking about 21 years ago. His smoking use included cigarettes. He started  smoking about 51 years ago. He has a 30 pack-year smoking history. He has never been exposed to tobacco smoke. He has never used smokeless tobacco. He reports current alcohol use. He reports that he does not use drugs.  Allergies:  Allergies  Allergen Reactions   Codeine Itching   Dilaudid [Hydromorphone Hcl] Itching   Fentanyl Itching    Pt states Fentanyl patch causes itching   Nucynta [Tapentadol] Other (See Comments)    MENTAL PROBLEMS   Penicillins Itching    Has patient had a PCN reaction causing immediate rash, facial/tongue/throat swelling, SOB or lightheadedness with hypotension:unsure childhood reaction Has patient had a PCN reaction causing severe rash involving mucus membranes or skin necrosis:unsure Has patient had a PCN reaction that required hospitalization:No Has patient had a PCN reaction occurring within the last 10 years:NO If all of the above answers are "NO", then may proceed with Cephalosporin use.     Medications Prior to Admission  Medication Sig Dispense Refill   DULoxetine (CYMBALTA) 60 MG capsule Take 60 mg by mouth 2 (two) times daily.      folic acid (FOLVITE) 1 MG tablet TAKE 2 TABLETS BY MOUTH DAILY 180 tablet 3   gabapentin (NEURONTIN) 600 MG tablet Take 600-1,200 mg by mouth See admin instructions. Take 600 mg by mouth in the morning and take 1200 mg at night     hydrochlorothiazide (HYDRODIURIL) 25 MG tablet Take 25 mg by mouth daily.     HYDROcodone-acetaminophen (NORCO) 5-325 MG tablet Take 1 tablet by mouth every 8 (eight) hours as needed for moderate pain (pain score 4-6). 30 tablet 0   hydroxychloroquine (PLAQUENIL) 200 MG tablet Take 1 tablet (200 mg total) by mouth 2 (two) times daily. 180 tablet 0   ibuprofen (ADVIL) 200 MG tablet Take 400 mg by mouth every 6 (six) hours as needed for moderate pain (pain score 4-6).     methotrexate 50 MG/2ML injection INJECT UNDER THE SKIN ONCE WEEKLY 12 mL 0   metoprolol succinate (TOPROL-XL) 25 MG 24 hr  tablet Take 25 mg by mouth daily.     Multiple Vitamins-Minerals (PRESERVISION AREDS 2 PO) Take 1 capsule by mouth in the morning and at bedtime.     NON FORMULARY Pt uses a cpap nightly     Povidone (IVIZIA DRY EYES OP) Place 1 drop into both eyes as needed (dry eyes).     rOPINIRole (REQUIP) 4 MG tablet Take 4 mg by mouth at bedtime.     simvastatin (ZOCOR) 40 MG tablet Take 40 mg by mouth at bedtime.     sulfaSALAzine (AZULFIDINE) 500 MG tablet TAKE 2 TABLETS BY MOUTH TWO TIMES A DAY 360 tablet 0   tamsulosin (FLOMAX) 0.4 MG CAPS capsule Take 0.4 mg by mouth daily.      traZODone (DESYREL) 50 MG tablet  Take 50 mg by mouth at bedtime.     BD ALLERGIST TRAY 27G X 1/2" 1 ML KIT USE TO INJECT METHOTREXATE ONCE WEEKLY (Patient not taking: Reported on 12/27/2022) 12 kit 3   gabapentin (NEURONTIN) 300 MG capsule TAKE THREE CAPSULES BY MOUTH THREE TIMES A DAY (Patient not taking: Reported on 08/08/2023) 90 capsule 3   methocarbamol (ROBAXIN) 750 MG tablet TAKE ONE TABLET BY MOUTH EVERY 8 HOURS AS NEEDED FOR MUSCLE SPASMS 60 tablet 1   Tuberculin-Allergy Syringes 27G X 1/2" 1 ML MISC Use to inject methotrexate once weekly. 12 each 3    No results found for this or any previous visit (from the past 48 hours). No results found.  Review of Systems  Musculoskeletal:  Positive for arthralgias.  All other systems reviewed and are negative.   Blood pressure 122/69, pulse 67, temperature 97.9 F (36.6 C), temperature source Oral, resp. rate 20, height 5\' 8"  (1.727 m), weight 115.2 kg, SpO2 94%. Physical Exam Vitals reviewed.  HENT:     Head: Normocephalic.     Nose: Nose normal.     Mouth/Throat:     Mouth: Mucous membranes are moist.  Eyes:     Pupils: Pupils are equal, round, and reactive to light.  Cardiovascular:     Rate and Rhythm: Normal rate.     Pulses: Normal pulses.  Pulmonary:     Effort: Pulmonary effort is normal.  Abdominal:     General: Abdomen is flat.  Musculoskeletal:      Cervical back: Normal range of motion.  Skin:    General: Skin is warm.     Capillary Refill: Capillary refill takes less than 2 seconds.  Neurological:     General: No focal deficit present.     Mental Status: He is alert.  Psychiatric:        Mood and Affect: Mood normal.    Ortho exam demonstrates range of motion on the right of 30/80/120. On the left 70/95/150. Does have some weakness with external rotation of the right at 4- out of 5 compared to the left at 5+ out of 5. Deltoid is functional bilaterally. Motor and sensory function of the hands intact bilaterally. No masses lymphadenopathy or skin changes noted in the left shoulder girdle region he does have evidence of recent left proximal biceps rupture with some swelling and ecchymosis in the anterior arm region on that left-hand side.  On the right-hand side prior scarring from prior intervention is present.  The deltopectoral approach may involve some of that scar in its proximal aspect of the approach incision.  The scar is very mature without any evidence of breakdown.  He is able to get that left hand overhead. Active forward flexion and abduction on the right is only to about 85/70.   Assessment/Plan mpression is bilateral shoulder pain right worse than left. Does have rotator cuff arthropathy on the right-hand side with irreparable rotator cuff tendons. Thin cut CT scan for preop planning for reverse shoulder replacement performed. The risks and benefits of reverse shoulder replacement are discussed with the patient included not limited to infection nerve vessel damage instability as well as incomplete pain relief and incomplete restoration of function. The expected rehab time is also discussed. All questions answered.  Burnard Bunting, MD 08/21/2023, 6:29 AM

## 2023-08-21 NOTE — Anesthesia Procedure Notes (Signed)
 Anesthesia Regional Block: Interscalene brachial plexus block   Pre-Anesthetic Checklist: , timeout performed,  Correct Patient, Correct Site, Correct Laterality,  Correct Procedure, Correct Position, site marked,  Risks and benefits discussed,  Surgical consent,  Pre-op evaluation,  At surgeon's request and post-op pain management  Laterality: Right  Prep: chloraprep       Needles:  Injection technique: Single-shot  Needle Type: Echogenic Needle     Needle Length: 9cm  Needle Gauge: 21     Additional Needles:   Procedures:,,,, ultrasound used (permanent image in chart),,    Narrative:  Start time: 08/21/2023 6:58 AM End time: 08/21/2023 7:05 AM Injection made incrementally with aspirations every 5 mL.  Performed by: Personally  Anesthesiologist: Collene Schlichter, MD  Additional Notes: No pain on injection. No increased resistance to injection. Injection made in 5cc increments.  Good needle visualization.  Patient tolerated procedure well.

## 2023-08-21 NOTE — Anesthesia Procedure Notes (Signed)
 Procedure Name: Intubation Date/Time: 08/21/2023 7:42 AM  Performed by: Shary Decamp, CRNAPre-anesthesia Checklist: Patient identified, Patient being monitored, Timeout performed, Emergency Drugs available and Suction available Patient Re-evaluated:Patient Re-evaluated prior to induction Oxygen Delivery Method: Circle System Utilized Preoxygenation: Pre-oxygenation with 100% oxygen Induction Type: IV induction Ventilation: Mask ventilation without difficulty Laryngoscope Size: Miller and 2 Grade View: Grade I Tube type: Oral Tube size: 7.5 mm Number of attempts: 1 Airway Equipment and Method: Stylet Placement Confirmation: ETT inserted through vocal cords under direct vision, positive ETCO2 and breath sounds checked- equal and bilateral Secured at: 23 cm Tube secured with: Tape Dental Injury: Teeth and Oropharynx as per pre-operative assessment

## 2023-08-21 NOTE — Op Note (Signed)
 NAMEDOYLE, KUNATH MEDICAL RECORD NO: [id number] ACCOUNT NO: [id number] DATE OF BIRTH: 05/10/1951 FACILITY: [facility] LOCATION: [facility] PHYSICIAN: Graylin Shiver. August Saucer, MD  Operative Report   DATE OF PROCEDURE: 08/21/2023  PREOPERATIVE DIAGNOSES: 1.  Right shoulder arthritis. 2.  Rotator cuff arthropathy.  POSTOPERATIVE DIAGNOSES: 1.  Right shoulder arthritis. 2.  Rotator cuff arthropathy.  PROCEDURE:  Right reverse shoulder replacement using [employer] small augmented base plate with one central compression screw, four peripheral locking screws, 36+6 glenosphere with mini humeral stem size 12 x 83 and +6 taper offset  humeral tray with +3 thickness bearing.  SURGEON:  Graylin Shiver. August Saucer, MD  ASSISTANT:  Karenann Cai, PA.  INDICATIONS:  The patient is a 73 year old with end-stage right shoulder arthritis and rotator cuff arthropathy.  He has failed nonoperative measures and presents for operative management after explanation of risks and benefits.  DESCRIPTION OF PROCEDURE:  The patient was brought to the operating room where general anesthetic was induced.  Preoperative antibiotics were administered.  Timeout was called.  The patient's right shoulder, arm, and hand were prescrubbed with hydrogen  peroxide followed by alcohol and Betadine, which was allowed to air dry, then prepped with ChloraPrep solution and draped in a sterile manner.  The patient was placed in the beach chair position with the head in neutral position.  After sterile prepping  and draping, the operative field was covered with Ioban.  Timeout was called.  A deltopectoral approach was made.  Skin and subcutaneous tissues were sharply divided.  Cephalic vein was mobilized laterally.  Subdeltoid and subacromial adhesions were  released manually.  Axillary nerve was palpated and protected at all times during the case.  Biceps tendon was tenodesed to the pec tendon.  The upper 1.5 cm of the pec tendon was released  for mobilization.  Next, the Cobalt retractor was placed.   Circumflex vessels were ligated.  Biceps tenodesis was performed with 5-0 Vicryl sutures.  The biceps tendon stump was then mobilized proximally and the rotator interval was opened.  At this time, the subscapularis was detached using a 15 blade and two  traction sutures.  Capsular detachment was also performed about 2 cm below the inferior humeral neck and taken around to the 5 o'clock position on this right shoulder.  This was done with a Cobb elevator and a sponge.  Good mobilization was achieved.   Severe end-stage arthritis was present in the shoulder joint.  Shoulder was dislocated.  Brown retractor was placed.  Reaming was performed on the humeral head up to a size 13.  The head was then cut in 30 degrees of retroversion just medial to the  medial aspect of the lesser tuberosity.  Next, broaching was performed up to a size 12, which gave a very good fit.  Bone quality was excellent.  Cap was placed.  Attention was directed towards the glenoid.  Posterior retractor was placed.  Labrum was  circumferentially excised with care being taken to avoid injury to the axillary nerve.  The patient specific guides were then utilized to obtain the optimal position of the baseplate.  Reaming was performed followed by reaming for the small augment.   This gave a very nice inferior tilt of about 5 degrees to the construct.  The baseplate was then placed with one central compression screw and four peripheral locking screws placed with excellent purchase obtained.  Next, trial reduction was performed.   The most stable construct was the +6 glenosphere with the +  6 offset humeral tray and a +3 liner.  This gave very good stability and was "two fingers tight."  Very stable to extension and adduction with a forward force along with internal and external  rotation at 90 degrees of abduction.  Difficult to reduce and difficult to relocate.  At this time, the shoulder  was dislocated.  The stem was removed on the humeral side.  Six SutureTapes were placed through drill holes in the lesser tuberosity.   IrriSept solution was then placed into the canal.  We also used IrriSept after the incision at multiple times during the case.  The glenosphere was then placed onto the dried [facility] taper with neutral offset.  Very good fit was obtained.  The stem was  then tapped into position after we suctioned out the IrriSept and placed some vancomycin powder into the humeral canal.  The same stability parameters were maintained with the trial reduction of the +6 tray and the +3 liner.  True liner was then placed  onto the dried Morse taper and excellent stability and range of motion was achieved.  Thorough irrigation was performed with 3 liters of pouring irrigation.  Next, the subscapularis was reattached to the lesser tuberosity with the arm in 30 degrees of  external rotation.  We obtained a very good subscapularis repair using 6 SutureTapes and nice knots.  At this time, thorough irrigation was performed with IrriSept solution on the implant.  Then, vancomycin was placed on the implant and the rotator  interval was closed with the arm in 30 degrees of external rotation using #1 Vicryl suture.  IrriSept solution was again utilized.  Axillary nerve was palpated and intact.  Next, thorough irrigation was performed.  We closed the deltopectoral interval  using #1 Vicryl suture followed by interrupted inverted 0 Vicryl suture, 2-0 Vicryl suture, and 3-0 Monocryl.  Steri-Strips, Aquacel dressing, and shoulder immobilizer were placed.  The patient tolerated the procedure well without immediate complication  and was transferred to the recovery room in stable condition.  Luke's assistance was required at all times for retraction, opening and closing, and mobilization of tissue.  His assistance was of medical necessity.   PUS D: 08/21/2023 11:12:01 am T: 08/21/2023 4:34:00 pm  JOB:  [id number]/ [id number]

## 2023-08-22 ENCOUNTER — Other Ambulatory Visit: Payer: Self-pay | Admitting: Physician Assistant

## 2023-08-22 ENCOUNTER — Encounter (HOSPITAL_COMMUNITY): Payer: Self-pay | Admitting: Orthopedic Surgery

## 2023-08-22 DIAGNOSIS — I10 Essential (primary) hypertension: Secondary | ICD-10-CM | POA: Diagnosis not present

## 2023-08-22 DIAGNOSIS — M0609 Rheumatoid arthritis without rheumatoid factor, multiple sites: Secondary | ICD-10-CM

## 2023-08-22 DIAGNOSIS — M75101 Unspecified rotator cuff tear or rupture of right shoulder, not specified as traumatic: Secondary | ICD-10-CM | POA: Diagnosis not present

## 2023-08-22 DIAGNOSIS — Z85828 Personal history of other malignant neoplasm of skin: Secondary | ICD-10-CM | POA: Diagnosis not present

## 2023-08-22 DIAGNOSIS — Z79899 Other long term (current) drug therapy: Secondary | ICD-10-CM | POA: Diagnosis not present

## 2023-08-22 DIAGNOSIS — Z87891 Personal history of nicotine dependence: Secondary | ICD-10-CM | POA: Diagnosis not present

## 2023-08-22 DIAGNOSIS — Z96653 Presence of artificial knee joint, bilateral: Secondary | ICD-10-CM | POA: Diagnosis not present

## 2023-08-22 DIAGNOSIS — M19011 Primary osteoarthritis, right shoulder: Secondary | ICD-10-CM | POA: Diagnosis not present

## 2023-08-22 MED ORDER — ASPIRIN 81 MG PO TBEC: 81.0000 mg | DELAYED_RELEASE_TABLET | Freq: Every day | ORAL | 0 refills | Status: DC

## 2023-08-22 MED ORDER — DOCUSATE SODIUM 100 MG PO CAPS: 100.0000 mg | ORAL_CAPSULE | Freq: Two times a day (BID) | ORAL | 0 refills | Status: DC

## 2023-08-22 MED ORDER — OXYCODONE HCL 5 MG PO TABS: 5.0000 mg | ORAL_TABLET | ORAL | 0 refills | Status: DC | PRN

## 2023-08-22 MED ORDER — CELECOXIB 100 MG PO CAPS: 100.0000 mg | ORAL_CAPSULE | Freq: Two times a day (BID) | ORAL | 0 refills | Status: DC

## 2023-08-22 NOTE — Evaluation (Signed)
 Occupational Therapy Evaluation Patient Details Name: David [name] MRN: [id number] DOB: Feb 18, 1951 Today's Date: 08/22/2023   History of Present Illness   Pt is a 73 y.o. male admitted to [facility] 08/21/23 for scheduled R Reverse total shoulder arthroplasty. NWB. PMH: arthritis, carpal tunnel, DDD, dysrhythmia, HTN, hyperlipemia, RA, Bil TKA     Clinical Impressions PTA pt was living with his spouse, who was assisting him with UB dressing.  Education completed regarding compensatory strategies for ADL tasks and functional mobility, management of sling, RUE ROM per specified parameters in the order set as indicated below, positioning of operative arm in sitting and supine and edema control, including use of "Iceman" Cold Therapy machine. Caregiver present for education, written handouts provided and reviewed using Teach Back and pt/caregiver verbalized/demonstrated understanding. Due to the below listed deficits, pt requires set up to min assist with ADL tasks and CGA with functional mobility. Caregiver will be able to provide necessary level of assistance at discharge. Pt to follow up with MD to progress rehab of the operative shoulder. OT will continue to follow acutely.       If plan is discharge home, recommend the following:   A little help with walking and/or transfers;A lot of help with bathing/dressing/bathroom;Assistance with cooking/housework;Help with stairs or ramp for entrance     Functional Status Assessment   Patient has had a recent decline in their functional status and demonstrates the ability to make significant improvements in function in a reasonable and predictable amount of time.     Equipment Recommendations   BSC/3in1     Recommendations for Other Services   PT consult     Precautions/Restrictions   Precautions Precautions: Fall Recall of Precautions/Restrictions: Intact Precaution/Restrictions Comments: No AROM shoulder,  Ext rotation to 30 degrees,   Pendulums, Ok elbow wrist and hand AROM Required Braces or Orthoses: Sling Restrictions Weight Bearing Restrictions Per Provider Order: Yes RUE Weight Bearing Per Provider Order: Non weight bearing     Mobility Bed Mobility Overal bed mobility: Needs Assistance             General bed mobility comments: Pt received in recliner    Transfers Overall transfer level: Needs assistance Equipment used: 1 person hand held assist Transfers: Sit to/from Stand, Bed to chair/wheelchair/BSC Sit to Stand: Contact guard assist     Step pivot transfers: Contact guard assist     General transfer comment: Hand held assist for functional mobility in hallway      Balance Overall balance assessment: Needs assistance Sitting-balance support: No upper extremity supported, Feet supported Sitting balance-Leahy Scale: Good     Standing balance support: Single extremity supported, During functional activity, Reliant on assistive device for balance Standing balance-Leahy Scale: Fair       ADL either performed or assessed with clinical judgement   ADL Overall ADL's : Needs assistance/impaired Eating/Feeding: Set up;Sitting   Grooming: Set up;Sitting           Upper Body Dressing : Minimal assistance;Cueing for UE precautions;Adhering to UE precautions;Cueing for compensatory techniques Upper Body Dressing Details (indicate cue type and reason): Heavy cueing to adhere to precautions Lower Body Dressing: Minimal assistance;Sit to/from stand Lower Body Dressing Details (indicate cue type and reason): Assist to don socks Toilet Transfer: Contact guard assist (Hand held assist) Toilet Transfer Details (indicate cue type and reason): Simulated in room            Per orders, RUE shoulder parameters as follows for ADL tasks: No shoulder  ROM; elbow/wrist/hand ROM only. While moving within specified parameters, pt/caregiver instructed on bathing and how to donn/doff shirt, placing operative  arm through sleeve first when donning and off last when doffing.Pt/caregiver educated on compensatory strategies for LB ADL and strategies to reduce risk of falls.  Pt/caregiver educated on donning/doffing sling and to wear the sling at all times with the exception of ADL, and to loosen the neck strap of the sling when the operative arm is in a supported position when sitting. In sitting or supine, pt instructed to have a pillow behind and under their operative arm to provide support. If assist needed with ambulation, caregiver educated on the importance of walking on pt's non-operative side.  Education regarding use of "IceMan" Cold Therapy completed, including the importance of using a barrier on the shoulder prior to positioning the wrap-on pad. Pt/caregiver verbalized/demonstrated understanding. Teach Back used while caregiver assisted with dressing pt and positioning "wrap-on pad" to facilitate DC.      Vision Baseline Vision/History: 0 No visual deficits Vision Assessment?: No apparent visual deficits            Pertinent Vitals/Pain Pain Assessment Pain Assessment: 0-10 Pain Score: 9  Pain Location: RUE Pain Descriptors / Indicators: Constant, Discomfort, Grimacing Pain Intervention(s): Premedicated before session, Monitored during session, Repositioned     Extremity/Trunk Assessment Upper Extremity Assessment Upper Extremity Assessment: RUE deficits/detail;Right hand dominant;Generalized weakness;LUE deficits/detail RUE Deficits / Details: Reverse total shoulder RUE: Unable to fully assess due to immobilization RUE Sensation: WNL RUE Coordination: decreased gross motor;decreased fine motor LUE Deficits / Details: MRI showed complete tear of the subscapularis & moderate tendinosis of the biceps tendon LUE: Shoulder pain with ROM LUE Sensation: WNL LUE Coordination: decreased gross motor (WFL)   Lower Extremity Assessment Lower Extremity Assessment: Defer to PT evaluation    Cervical / Trunk Assessment Cervical / Trunk Assessment: Normal   Communication Communication Communication: No apparent difficulties   Cognition Arousal: Alert Behavior During Therapy: Impulsive Cognition: No apparent impairments         OT - Cognition Comments: Pt tangiental, and difficulty following commands, suspect d/t pain medications         Following commands: Impaired Following commands impaired: Follows one step commands with increased time, Follows one step commands inconsistently     Cueing  General Comments   Cueing Techniques: Verbal cues;Tactile cues;Visual cues  Wife present for session, appears supportive   Exercises Exercises: Shoulder, Other exercises Shoulder Exercises Pendulum Exercise: 10 reps, PROM, Right, Standing Elbow Flexion: 10 reps, AROM, Seated Elbow Extension: AROM, 10 reps, Seated Wrist Flexion: AROM, 10 reps, Right, Seated Wrist Extension: AROM, Right, 10 reps, Seated Digit Composite Flexion: AROM, Right, 10 reps, Seated Composite Extension: AROM, 10 reps, Right, Seated Other Exercises Other Exercises: forearm pronation AROM10 reps seated Other Exercises: forearm supination AROM 10 reps seated   Shoulder Instructions      Home Living Family/patient expects to be discharged to:: Private residence Living Arrangements: Spouse/significant other Available Help at Discharge: Family;Available 24 hours/day Type of Home: House Home Access: Stairs to enter [employer] of Steps: 3 Entrance Stairs-Rails: Can reach both Home Layout: Two level;1/2 bath on main level;Able to live on main level with bedroom/bathroom         Bathroom Toilet: Standard Bathroom Accessibility: Yes How Accessible: Accessible via walker Home Equipment: Rolling Walker (2 wheels);Cane - single point   Additional Comments: Planning to sleep in recliner      Prior Functioning/Environment Prior Level of Function :  Needs assist;Driving        Physical Assist : ADLs (physical)       ADLs Comments: Assist to don shirts    OT Problem List: Decreased strength;Decreased range of motion;Decreased activity tolerance;Impaired balance (sitting and/or standing);Decreased knowledge of precautions;Impaired UE functional use   OT Treatment/Interventions: Self-care/ADL training;Therapeutic exercise;DME and/or AE instruction;Therapeutic activities;Patient/family education;Balance training      OT Goals(Current goals can be found in the care plan section)   Acute Rehab OT Goals Patient Stated Goal: To go home OT Goal Formulation: With patient Time For Goal Achievement: 09/05/23 Potential to Achieve Goals: Good   OT Frequency:  Min 2X/week       AM-PAC OT "6 Clicks" Daily Activity     Outcome Measure Help from another person eating meals?: A Little Help from another person taking care of personal grooming?: A Little Help from another person toileting, which includes using toliet, bedpan, or urinal?: A Lot Help from another person bathing (including washing, rinsing, drying)?: A Lot Help from another person to put on and taking off regular upper body clothing?: A Little Help from another person to put on and taking off regular lower body clothing?: A Little 6 Click Score: 16   End of Session Equipment Utilized During Treatment: Gait belt;Other (comment) (Sling) Nurse Communication: Mobility status  Activity Tolerance: Patient tolerated treatment well Patient left: in chair;with call bell/phone within reach;with chair alarm set;with family/visitor present  OT Visit Diagnosis: Unsteadiness on feet (R26.81);Other abnormalities of gait and mobility (R26.89);Other (comment) (Decreased UE ROM)                Time: 3244-0102 OT Time Calculation (min): 47 min Charges:  OT General Charges $OT Visit: 1 Visit OT Evaluation $OT Eval Moderate Complexity: 1 Mod OT Treatments $Self Care/Home Management : 8-22 mins $Therapeutic  Exercise: 8-22 mins  Ivor Messier, OT  Acute Rehabilitation Services Office [phone] Secure chat preferred   Marilynne Drivers 08/22/2023, 10:06 AM

## 2023-08-22 NOTE — Plan of Care (Signed)
  Problem: Education: Goal: Knowledge of General Education information will improve Description: Including pain rating scale, medication(s)/side effects and non-pharmacologic comfort measures Outcome: Progressing   Problem: Health Behavior/Discharge Planning: Goal: Ability to manage health-related needs will improve Outcome: Progressing   Problem: Clinical Measurements: Goal: Will remain free from infection Outcome: Progressing Goal: Diagnostic test results will improve Outcome: Progressing Goal: Respiratory complications will improve Outcome: Progressing Goal: Cardiovascular complication will be avoided Outcome: Progressing   Problem: Nutrition: Goal: Adequate nutrition will be maintained Outcome: Progressing   Problem: Elimination: Goal: Will not experience complications related to bowel motility Outcome: Progressing Goal: Will not experience complications related to urinary retention Outcome: Progressing   Problem: Safety: Goal: Ability to remain free from injury will improve Outcome: Progressing   Problem: Skin Integrity: Goal: Risk for impaired skin integrity will decrease Outcome: Progressing   Problem: Education: Goal: Knowledge of the prescribed therapeutic regimen will improve Outcome: Progressing Goal: Understanding of activity limitations/precautions following surgery will improve Outcome: Progressing   Problem: Activity: Goal: Ability to tolerate increased activity will improve Outcome: Progressing   Problem: Pain Management: Goal: Pain level will decrease with appropriate interventions Outcome: Progressing

## 2023-08-22 NOTE — Anesthesia Postprocedure Evaluation (Signed)
 Anesthesia Post Note  Patient: David [name]  Procedure(s) Performed: ARTHROPLASTY, SHOULDER, TOTAL, REVERSE (Right: Shoulder)     Patient location during evaluation: PACU Anesthesia Type: General Level of consciousness: awake and alert Pain management: pain level controlled Vital Signs Assessment: post-procedure vital signs reviewed and stable Respiratory status: spontaneous breathing, nonlabored ventilation and respiratory function stable Cardiovascular status: blood pressure returned to baseline and stable Postop Assessment: no apparent nausea or vomiting Anesthetic complications: no   No notable events documented.  Last Vitals:    Last Pain:                 Collene Schlichter

## 2023-08-22 NOTE — Progress Notes (Signed)
 DISCHARGE NOTE HOME David [name] to be discharged Home per MD order. Discussed prescriptions and follow up appointments with the patient. Prescriptions given to patient; medication list explained in detail. Patient verbalized understanding.  Skin clean, dry and intact without evidence of skin break down, no evidence of skin tears noted. IV catheter discontinued intact. Site without signs and symptoms of complications. Dressing and pressure applied. Pt denies pain at the site currently. No complaints noted.  Going home with right shoulder incision  An After Visit Summary (AVS) was printed and given to the patient. Patient escorted via wheelchair, and discharged home via private auto.  [name], RN

## 2023-08-22 NOTE — Evaluation (Signed)
 Physical Therapy Evaluation and Discharge Patient Details Name: David [name] MRN: [id number] DOB: 1951/03/29 Today's Date: 08/22/2023  History of Present Illness  Pt is a 73 y.o. male admitted to [facility] 08/21/23 for scheduled R Reverse total shoulder arthroplasty. NWB. PMH: arthritis, carpal tunnel, DDD, dysrhythmia, HTN, hyperlipemia, RA, Bil TKA  Clinical Impression  Patient evaluated by Physical Therapy with no further acute PT needs identified. Pt eager to participate in physical therapy evaluation and discharge home. Pt able to verbalize shoulder precautions and sling use. Pt presents with mild dynamic balance deficits; benefits from use of single point cane. Ambulating 100 ft with a SPC and supervision for safety. Negotiated 3 steps with a left railing to simulate home set up. Education provided regarding fall prevention techniques including home set up and appropriate DME. Pt plans to purchase a cane out of pocket. All education has been completed and the patient has no further questions. No follow-up Physical Therapy or equipment needs. PT is signing off. Thank you for this referral.       If plan is discharge home, recommend the following: Assistance with cooking/housework;Assist for transportation;Help with stairs or ramp for entrance   Can travel by private vehicle        Equipment Recommendations None recommended by PT  Recommendations for Other Services       Functional Status Assessment Patient has had a recent decline in their functional status and demonstrates the ability to make significant improvements in function in a reasonable and predictable amount of time.     Precautions / Restrictions Precautions Precautions: Fall Recall of Precautions/Restrictions: Intact Precaution/Restrictions Comments: No AROM shoulder,  Ext rotation to 30 degrees,  Pendulums, Ok elbow wrist and hand AROM Required Braces or Orthoses: Sling Restrictions Weight Bearing Restrictions Per Provider  Order: Yes RUE Weight Bearing Per Provider Order: Non weight bearing      Mobility  Bed Mobility               General bed mobility comments: Pt received in recliner    Transfers Overall transfer level: Needs assistance Equipment used: Straight cane Transfers: Sit to/from Stand Sit to Stand: Supervision           General transfer comment: Verbal cues to push up with left hand from chair    Ambulation/Gait Ambulation/Gait assistance: Supervision Gait Distance (Feet): 100 Feet Assistive device: Straight cane Gait Pattern/deviations: Step-through pattern, Decreased stride length       General Gait Details: Mild dynamic instability, benefits from single UE support. Instruction for cane use  Stairs Stairs: Yes Stairs assistance: Supervision Stair Management: One rail Left Number of Stairs: 3    Wheelchair Mobility     Tilt Bed    Modified Rankin (Stroke Patients Only)       Balance Overall balance assessment: Needs assistance Sitting-balance support: No upper extremity supported, Feet supported Sitting balance-Leahy Scale: Good     Standing balance support: Single extremity supported, During functional activity, Reliant on assistive device for balance Standing balance-Leahy Scale: Fair                               Pertinent Vitals/Pain Pain Assessment Pain Assessment: Faces Faces Pain Scale: Hurts little more Pain Location: RUE Pain Descriptors / Indicators: Constant, Discomfort, Grimacing Pain Intervention(s): Limited activity within patient's tolerance, Monitored during session    Home Living Family/patient expects to be discharged to:: Private residence Living Arrangements: Spouse/significant other Available  Help at Discharge: Family;Available 24 hours/day Type of Home: House Home Access: Stairs to enter Entrance Stairs-Rails: Can reach both Entrance Stairs-Number of Steps: 3   Home Layout: Two level;1/2 bath on main  level;Able to live on main level with bedroom/bathroom Home Equipment: Rolling Walker (2 wheels);Cane - single point Additional Comments: Planning to sleep in recliner    Prior Function Prior Level of Function : Needs assist;Driving       Physical Assist : ADLs (physical)       ADLs Comments: Assist to don shirts     Extremity/Trunk Assessment   Upper Extremity Assessment Upper Extremity Assessment: Defer to OT evaluation    Lower Extremity Assessment Lower Extremity Assessment: Overall WFL for tasks assessed    Cervical / Trunk Assessment Cervical / Trunk Assessment: Normal  Communication   Communication Communication: No apparent difficulties    Cognition Arousal: Alert Behavior During Therapy: WFL for tasks assessed/performed   PT - Cognitive impairments: No apparent impairments                         Following commands: Impaired Following commands impaired: Follows one step commands with increased time, Follows one step commands inconsistently     Cueing Cueing Techniques: Verbal cues, Tactile cues, Visual cues     General Comments      Exercises     Assessment/Plan    PT Assessment Patient does not need any further PT services  PT Problem List         PT Treatment Interventions      PT Goals (Current goals can be found in the Care Plan section)  Acute Rehab PT Goals Patient Stated Goal: go home PT Goal Formulation: All assessment and education complete, DC therapy    Frequency       Co-evaluation               AM-PAC PT "6 Clicks" Mobility  Outcome Measure Help needed turning from your back to your side while in a flat bed without using bedrails?: None Help needed moving from lying on your back to sitting on the side of a flat bed without using bedrails?: None Help needed moving to and from a bed to a chair (including a wheelchair)?: A Little Help needed standing up from a chair using your arms (e.g., wheelchair or  bedside chair)?: A Little Help needed to walk in hospital room?: A Little Help needed climbing 3-5 steps with a railing? : A Little 6 Click Score: 20    End of Session Equipment Utilized During Treatment: Other (comment) (sling) Activity Tolerance: Patient tolerated treatment well Patient left: in chair;with call bell/phone within reach;with family/visitor present Nurse Communication: Mobility status PT Visit Diagnosis: Unsteadiness on feet (R26.81)    Time: 1110-1140 PT Time Calculation (min) (ACUTE ONLY): 30 min   Charges:   PT Evaluation $PT Eval Low Complexity: 1 Low PT Treatments $Gait Training: 8-22 mins PT General Charges $$ ACUTE PT VISIT: 1 Visit         Lillia Pauls, PT, DPT Acute Rehabilitation Services Office [phone]   Norval Morton 08/22/2023, 12:00 PM

## 2023-08-22 NOTE — Progress Notes (Signed)
  Subjective: David [name] is a 73 y.o. male s/p right RSA.  They are POD 1.  Pt's pain is controlled.  Patient denies any complaints of chest pain, shortness of breath, abdominal pain.  Interscalene block is wearing off.  He has not done a lot of walking yet.  Did not sleep well last night.  Wants to discharge home today.  Objective: Vital signs in last 24 hours: Temp:  [97.8 F (36.6 C)-98.1 F (36.7 C)] 97.9 F (36.6 C) (04/02 0435) Pulse Rate:  [60-75] 71 (04/02 0439) Resp:  [14-24] 16 (04/02 0435) BP: (101-149)/(63-91) 132/68 (04/02 0435) SpO2:  [87 %-98 %] 92 % (04/02 0439)  Intake/Output from previous day: 04/01 0701 - 04/02 0700 In: 2330 [P.O.:480; I.V.:950; IV Piggyback:900] Out: 100 [Blood:100] Intake/Output this shift: No intake/output data recorded.  Exam:  No gross blood or drainage overlying the dressing 2+ radial pulse of the operative extremity Postoperative physical exam somewhat limited by interscalene block but intact EPL, FPL, finger abduction, grip strength, bicep flexion, tricep extension, deltoid.  Axillary nerve is intact with deltoid firing   Labs: No results for input(s): "HGB" in the last 72 hours. No results for input(s): "WBC", "RBC", "HCT", "PLT" in the last 72 hours. No results for input(s): "NA", "K", "CL", "CO2", "BUN", "CREATININE", "GLUCOSE", "CALCIUM" in the last 72 hours. No results for input(s): "LABPT", "INR" in the last 72 hours.  Assessment/Plan: Pt is POD 1 s/p right RSA    -Plan to discharge to home today after working with occupational therapy  -No lifting with the operative arm  -Follow-up with Dr. August Saucer in clinic 2 weeks postoperatively     [facility] 08/22/2023, 8:19 AM

## 2023-08-22 NOTE — Care Management Obs Status (Signed)
 MEDICARE OBSERVATION STATUS NOTIFICATION   Patient Details  Name: David [name] MRN: [id number] Date of Birth: 03/14/1951   Medicare Observation Status Notification Given:  Yes    Lorri Frederick, LCSW 08/22/2023, 10:03 AM

## 2023-08-23 NOTE — Telephone Encounter (Signed)
 Last Fill: 05/09/2023  Eye exam:  11/10/2022 WNL   Labs: 08/10/2023 Glucose 107  Next Visit: 11/08/2023  Last Visit: 05/28/2023  EA:[id number] arthritis of multiple sites with negative rheumatoid factor   Current Dose per office note 05/28/2023: plaquenil 200 mg 1 tablet twice daily   Okay to refill Plaquenil?

## 2023-09-02 ENCOUNTER — Other Ambulatory Visit: Payer: Self-pay | Admitting: Surgical

## 2023-09-04 DIAGNOSIS — M75112 Incomplete rotator cuff tear or rupture of left shoulder, not specified as traumatic: Secondary | ICD-10-CM | POA: Diagnosis not present

## 2023-09-04 DIAGNOSIS — Z4889 Encounter for other specified surgical aftercare: Secondary | ICD-10-CM | POA: Diagnosis not present

## 2023-09-05 ENCOUNTER — Ambulatory Visit: Admitting: Surgical

## 2023-09-05 ENCOUNTER — Other Ambulatory Visit (INDEPENDENT_AMBULATORY_CARE_PROVIDER_SITE_OTHER): Payer: Self-pay

## 2023-09-05 ENCOUNTER — Other Ambulatory Visit: Payer: Self-pay | Admitting: *Deleted

## 2023-09-05 DIAGNOSIS — Z79899 Other long term (current) drug therapy: Secondary | ICD-10-CM | POA: Diagnosis not present

## 2023-09-05 DIAGNOSIS — Z96611 Presence of right artificial shoulder joint: Secondary | ICD-10-CM

## 2023-09-06 LAB — CBC WITH DIFFERENTIAL/PLATELET
Absolute Lymphocytes: 1793 {cells}/uL (ref 850–3900)
Absolute Monocytes: 814 {cells}/uL (ref 200–950)
Basophils Absolute: 79 {cells}/uL (ref 0–200)
Basophils Relative: 1.1 %
Eosinophils Absolute: 223 {cells}/uL (ref 15–500)
Eosinophils Relative: 3.1 %
HCT: 39.1 % (ref 38.5–50.0)
Hemoglobin: 13 g/dL — ABNORMAL LOW (ref 13.2–17.1)
MCH: 31.6 pg (ref 27.0–33.0)
MCHC: 33.2 g/dL (ref 32.0–36.0)
MCV: 95.1 fL (ref 80.0–100.0)
MPV: 9.3 fL (ref 7.5–12.5)
Monocytes Relative: 11.3 %
Neutro Abs: 4291 {cells}/uL (ref 1500–7800)
Neutrophils Relative %: 59.6 %
Platelets: 353 10*3/uL (ref 140–400)
RBC: 4.11 10*6/uL — ABNORMAL LOW (ref 4.20–5.80)
RDW: 13.8 % (ref 11.0–15.0)
Total Lymphocyte: 24.9 %
WBC: 7.2 10*3/uL (ref 3.8–10.8)

## 2023-09-06 LAB — COMPREHENSIVE METABOLIC PANEL WITH GFR
AG Ratio: 1.5 (calc) (ref 1.0–2.5)
ALT: 21 U/L (ref 9–46)
AST: 22 U/L (ref 10–35)
Albumin: 4 g/dL (ref 3.6–5.1)
Alkaline phosphatase (APISO): 85 U/L (ref 35–144)
BUN: 20 mg/dL (ref 7–25)
CO2: 28 mmol/L (ref 20–32)
Calcium: 9.6 mg/dL (ref 8.6–10.3)
Chloride: 103 mmol/L (ref 98–110)
Creat: 0.87 mg/dL (ref 0.70–1.28)
Globulin: 2.6 g/dL (ref 1.9–3.7)
Glucose, Bld: 151 mg/dL — ABNORMAL HIGH (ref 65–99)
Potassium: 4.3 mmol/L (ref 3.5–5.3)
Sodium: 138 mmol/L (ref 135–146)
Total Bilirubin: 0.4 mg/dL (ref 0.2–1.2)
Total Protein: 6.6 g/dL (ref 6.1–8.1)
eGFR: 92 mL/min/{1.73_m2} (ref >=60)

## 2023-09-09 ENCOUNTER — Other Ambulatory Visit: Payer: Self-pay | Admitting: Physician Assistant

## 2023-09-09 ENCOUNTER — Encounter: Payer: Self-pay | Admitting: Surgical

## 2023-09-09 DIAGNOSIS — M0609 Rheumatoid arthritis without rheumatoid factor, multiple sites: Secondary | ICD-10-CM

## 2023-09-09 MED ORDER — CELECOXIB 100 MG PO CAPS: 100.0000 mg | ORAL_CAPSULE | Freq: Two times a day (BID) | ORAL | 0 refills | Status: DC

## 2023-09-09 NOTE — Progress Notes (Signed)
 Post-Op Visit Note   Patient: David [name]           Date of Birth: Nov 01, 1950           MRN: [id number] Visit Date: 09/05/2023 PCP: Lorenza Romans, FNP   Assessment & Plan:  Chief Complaint:  Chief Complaint  Patient presents with   Right Shoulder - Routine Post Op    08/21/2023 Right RSA   Visit Diagnoses:  1. S/P reverse total shoulder arthroplasty, right     Plan: David [name] is a 73 y.o. male who presents s/p right reverse shoulder arthroplasty on 08/21/2023.  Patient is doing well and pain is overall controlled.  .  Denies any chest pain, SOB, fevers, chills.  No complaint of any instability symptoms.  Started doing pendulum exercises about 2 days ago.  Does report a electric shock sensation that extends from his elbow to his thumb when he tries to straighten his elbow..    On exam, patient has range of motion 20 degrees X rotation, 60 degrees abduction, 90 degrees forward elevation passively.  Intact EPL, FPL, finger abduction, finger adduction, pronation/supination, bicep, tricep, deltoid of operative extremity.  Axillary nerve intact with deltoid firing.  Incision is healing well without evidence of infection or dehiscence.  Incision was made sure to be covered with Steri-Strips from the proximal to distal aspect of the length of the incision.  2+ radial pulse of the operative extremity  Plan is discontinue sling.  Okay to very lightly lift with the operative extremity but no lifting anything heavier than a coffee cup or cell phone.  Start physical therapy to focus on passive range of motion and active range of motion with deltoid isometrics.  Do not want to externally rotate past 30 degrees to protect subscapularis repair.  Follow-up in 4 weeks for clinical recheck with Dr. [name].   We will keep an eye on his electric shock sensation that he gets from his elbow to his thumb anytime he straighten his elbow.  This was reproducible on exam today.  Hopefully as he discontinues  his sling and gets more used to straightening his elbow again, the symptoms will die down but recommended if he continues to have the symptoms 10 to 14 days out from this visit, he should reach out and we can set up a nerve conduction study for him.  Follow-Up Instructions: No follow-ups on file.   Orders:  Orders Placed This Encounter  Procedures   XR Shoulder Right   Ambulatory referral to Physical Therapy   No orders of the defined types were placed in this encounter.   Imaging: No results found.  PMFS History: Patient Active Problem List   Diagnosis Date Noted   Rotator cuff arthropathy, right 08/21/2023   S/P reverse total shoulder arthroplasty, right 08/21/2023   Sepsis due to undetermined organism (HCC) 08/20/2022   Cervical spondylosis with radiculopathy 02/26/2020   SBO (small bowel obstruction) (HCC) 08/21/2017   Trochanteric bursitis of left hip 01/18/2017   Lateral epicondylitis, right elbow 01/18/2017   Rheumatoid arthritis (HCC) 03/15/2016   DJD (degenerative joint disease), cervical 03/15/2016   DDD (degenerative disc disease), lumbar 03/15/2016   HTN (hypertension) 03/15/2016   Obesity 03/15/2016   Elevated cholesterol 03/15/2016   Neuralgia 03/15/2016   Malignant melanoma of right side of neck (HCC) 03/15/2016   BBB (bundle branch block) 03/15/2016   High risk medication use 03/15/2016   Osteoarthritis of lumbar spine 03/15/2016   Primary osteoarthritis of  both hands 03/15/2016   Primary osteoarthritis of both knees 03/15/2016   Mesenteric adenitis 06/29/2015   Past Medical History:  Diagnosis Date   Arthritis    Balance problem    Barrett's esophagus    BBB (bundle branch block) 03/15/2016   Carpal tunnel syndrome    DDD (degenerative disc disease), lumbar 03/15/2016   Depression    Dysrhythmia    had some tachycardia with steroids   Elevated cholesterol 03/15/2016   Headache(784.0)    Ocular Migraines - takes Ambien  for it   Heart murmur     HTN (hypertension) 03/15/2016   Hyperlipemia    Hypertension    Malignant melanoma of right side of neck (HCC) 03/15/2016   73 years old   Neuralgia 03/15/2016   Ocular migraine    Osteoarthritis    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    RLS (restless legs syndrome)    Sleep apnea    uses CPAP   Snores    Status post gastric banding    Wears glasses    driving    Family History  Problem Relation Age of Onset   Heart disease Mother    Alzheimer's disease Father     Past Surgical History:  Procedure Laterality Date   ANTERIOR CERVICAL DECOMPRESSION/DISCECTOMY FUSION 4 LEVELS N/A 02/26/2020   Procedure: ANTERIOR CERVICAL DECOMPRESSION/DISCECTOMY FUSION, INTERBODY PROSTHESIS, PLATE/SCREWS CERVICAL THREE-FOUR CERVICAL FOUR-FIVE CERVICAL FIVE-SIX- CERVICAL SIX-SEVEN;  Surgeon: Garry Kansas, MD;  Location: [facility];  Service: Neurosurgery;  Laterality: N/A;   CARPAL TUNNEL RELEASE Right 09/26/2012   Procedure: CARPAL TUNNEL RELEASE;  Surgeon: Milagros Alf, MD;  Location: [facility];  Service: Orthopedics;  Laterality: Right;   CARPAL TUNNEL RELEASE Left 10/24/2012   Procedure: CARPAL TUNNEL RELEASE;  Surgeon: Milagros Alf, MD;  Location: [facility];  Service: Orthopedics;  Laterality: Left;   CIRCUMCISION     as a baby   EXCISION MELANOMA WITH SENTINEL LYMPH NODE BIOPSY  1984   rt neck with mulpipal nodes and some muscle excised rt neck-shoulder   KNEE ARTHROPLASTY     knee replacement     LAPAROSCOPIC GASTRIC BANDING  03/15/2009   LAPAROSCOPIC GASTRIC BANDING     MUSCLE BIOPSY  2004   rt thigh to r/o myocitis   nerve conduction velosity test     REVERSE SHOULDER ARTHROPLASTY Right 08/21/2023   Procedure: ARTHROPLASTY, SHOULDER, TOTAL, REVERSE;  Surgeon: Jasmine Mesi, MD;  Location: [facility] OR;  Service: Orthopedics;  Laterality: Right;  RIGHT REVERSE SHOULDER ARTHROPLASTY    SHOULDER SURGERY Right 12/2021   TONSILLECTOMY     TOTAL KNEE ARTHROPLASTY Bilateral 12/05/2010   TRIGGER FINGER RELEASE Right 09/26/2012   Procedure: RELEASE TRIGGER FINGER/A-1 PULLEY RING AND SMALL ;  Surgeon: Milagros Alf, MD;  Location: [facility];  Service: Orthopedics;  Laterality: Right;   TRIGGER FINGER RELEASE Left 10/24/2012   Procedure: RELEASE TRIGGER FINGER/A-1 PULLEY LEFT MIDDLE AND LEFT RING;  Surgeon: Milagros Alf, MD;  Location: [facility];  Service: Orthopedics;  Laterality: Left;   UPPER GI ENDOSCOPY     Social History   Occupational History   Not on file  Tobacco Use   Smoking status: Former    Current packs/day: 0.00    Average packs/day: 1 pack/day for 30.0 years (30.0 ttl pk-yrs)    Types: Cigarettes    Start  date: 09/24/1971    Quit date: 09/23/2001    Years since quitting: 21.9    Passive exposure: Never   Smokeless tobacco: Never  Vaping Use   Vaping status: Never Used  Substance and Sexual Activity   Alcohol use: Yes    Comment: maybe one drink once a month   Drug use: No   Sexual activity: Not on file

## 2023-09-10 NOTE — Telephone Encounter (Signed)
 Last Fill: 06/18/2023  Labs: 09/05/2023 Glucose is 151. Rest of CMP WNL.   RBC count and hemoglobin are borderline low. Rest of CBC WNL.  We will continue to monitor closely.  Next Visit: 11/08/2023  Last Visit: 05/28/2023  DX: Rheumatoid arthritis of multiple sites with negative rheumatoid factor   Current Dose per office note 05/28/2023: Methotrexate  1.0 ml sq injections once weekly   Okay to refill Methotrexate ?

## 2023-09-12 NOTE — Therapy (Signed)
 OUTPATIENT PHYSICAL THERAPY SHOULDER EVALUATION   Patient Name: David [name] MRN: [id number] DOB:03/28/1951, 73 y.o., male Today's Date: 09/12/2023  END OF SESSION:   Past Medical History:  Diagnosis Date   Arthritis    Balance problem    Barrett's esophagus    BBB (bundle branch block) 03/15/2016   Carpal tunnel syndrome    DDD (degenerative disc disease), lumbar 03/15/2016   Depression    Dysrhythmia    had some tachycardia with steroids   Elevated cholesterol 03/15/2016   Headache(784.0)    Ocular Migraines - takes Ambien  for it   Heart murmur    HTN (hypertension) 03/15/2016   Hyperlipemia    Hypertension    Malignant melanoma of right side of neck (HCC) 03/15/2016   73 years old   Neuralgia 03/15/2016   Ocular migraine    Osteoarthritis    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    RLS (restless legs syndrome)    Sleep apnea    uses CPAP   Snores    Status post gastric banding    Wears glasses    driving   Past Surgical History:  Procedure Laterality Date   ANTERIOR CERVICAL DECOMPRESSION/DISCECTOMY FUSION 4 LEVELS N/A 02/26/2020   Procedure: ANTERIOR CERVICAL DECOMPRESSION/DISCECTOMY FUSION, INTERBODY PROSTHESIS, PLATE/SCREWS CERVICAL THREE-FOUR CERVICAL FOUR-FIVE CERVICAL FIVE-SIX- CERVICAL SIX-SEVEN;  Surgeon: Garry Kansas, MD;  Location: [facility] OR;  Service: Neurosurgery;  Laterality: N/A;   CARPAL TUNNEL RELEASE Right 09/26/2012   Procedure: CARPAL TUNNEL RELEASE;  Surgeon: Milagros Alf, MD;  Location: [facility];  Service: Orthopedics;  Laterality: Right;   CARPAL TUNNEL RELEASE Left 10/24/2012   Procedure: CARPAL TUNNEL RELEASE;  Surgeon: Milagros Alf, MD;  Location: [facility];  Service: Orthopedics;  Laterality: Left;   CIRCUMCISION     as a baby   EXCISION MELANOMA WITH SENTINEL LYMPH NODE BIOPSY  1984   rt neck with mulpipal nodes and some  muscle excised rt neck-shoulder   KNEE ARTHROPLASTY     knee replacement     LAPAROSCOPIC GASTRIC BANDING  03/15/2009   LAPAROSCOPIC GASTRIC BANDING     MUSCLE BIOPSY  2004   rt thigh to r/o myocitis   nerve conduction velosity test     REVERSE SHOULDER ARTHROPLASTY Right 08/21/2023   Procedure: ARTHROPLASTY, SHOULDER, TOTAL, REVERSE;  Surgeon: Jasmine Mesi, MD;  Location: [facility] OR;  Service: Orthopedics;  Laterality: Right;  RIGHT REVERSE SHOULDER ARTHROPLASTY   SHOULDER SURGERY Right 12/2021   TONSILLECTOMY     TOTAL KNEE ARTHROPLASTY Bilateral 12/05/2010   TRIGGER FINGER RELEASE Right 09/26/2012   Procedure: RELEASE TRIGGER FINGER/A-1 PULLEY RING AND SMALL ;  Surgeon: Milagros Alf, MD;  Location: [facility];  Service: Orthopedics;  Laterality: Right;   TRIGGER FINGER RELEASE Left 10/24/2012   Procedure: RELEASE TRIGGER FINGER/A-1 PULLEY LEFT MIDDLE AND LEFT RING;  Surgeon: Milagros Alf, MD;  Location: [facility];  Service: Orthopedics;  Laterality: Left;   UPPER GI ENDOSCOPY     Patient Active Problem List   Diagnosis Date Noted   Rotator cuff arthropathy, right 08/21/2023   S/P reverse total shoulder arthroplasty, right 08/21/2023   Sepsis due to undetermined organism (HCC) 08/20/2022   Cervical spondylosis with radiculopathy 02/26/2020   SBO (small bowel obstruction) (HCC) 08/21/2017   Trochanteric bursitis of left hip 01/18/2017   Lateral epicondylitis, right elbow  01/18/2017   Rheumatoid arthritis (HCC) 03/15/2016   DJD (degenerative joint disease), cervical 03/15/2016   DDD (degenerative disc disease), lumbar 03/15/2016   HTN (hypertension) 03/15/2016   Obesity 03/15/2016   Elevated cholesterol 03/15/2016   Neuralgia 03/15/2016   Malignant melanoma of right side of neck (HCC) 03/15/2016   BBB (bundle branch block) 03/15/2016   High risk medication use 03/15/2016   Osteoarthritis of lumbar spine 03/15/2016   Primary osteoarthritis of  both hands 03/15/2016   Primary osteoarthritis of both knees 03/15/2016   Mesenteric adenitis 06/29/2015    PCP: Stamey, Lowell Rude, FNP   REFERRING PROVIDER: Casilda Clayman, PA-C   REFERRING DIAG: [phone] (ICD-10-CM) - S/P reverse total shoulder arthroplasty, righ   THERAPY DIAG:  No diagnosis found.  Rationale for Evaluation and Treatment: Rehabilitation  ONSET DATE: 08/21/23 DOS  SUBJECTIVE:                                                                                                                                                                                      SUBJECTIVE STATEMENT: *** Hand dominance: {MISC; OT HAND DOMINANCE:[phone]}  PERTINENT HISTORY: ***  PAIN:  Are you having pain? {OPRCPAIN:27236}  PRECAUTIONS: Shoulder Okay to very lightly lift with the operative extremity but no lifting anything heavier than a coffee cup or cell phone. Start physical therapy to focus on passive range of motion and active range of motion with deltoid isometrics. Do not want to externally rotate past 30 degrees to protect subscapularis repair.   RED FLAGS: {PT Red Flags:29287}   WEIGHT BEARING RESTRICTIONS: {Yes ***/No:24003}  FALLS:  Has patient fallen in last 6 months? {fallsyesno:27318}  LIVING ENVIRONMENT: Lives with: {OPRC lives with:25569::"lives with their family"} Lives in: {Lives in:25570} Stairs: {opstairs:27293} Has following equipment at home: {Assistive devices:23999}  OCCUPATION: ***  PLOF: {PLOF:24004}  PATIENT GOALS:***  NEXT MD VISIT: May 14?  OBJECTIVE:  Note: Objective measures were completed at Evaluation unless otherwise noted.  DIAGNOSTIC FINDINGS:  ***  PATIENT SURVEYS:  Quick Dash ***  COGNITION: Overall cognitive status: {cognition:24006}     SENSATION: {sensation:27233}  POSTURE: ***  UPPER EXTREMITY ROM:   {AROM/PROM:27142} ROM Right eval Left eval  Shoulder flexion    Shoulder extension    Shoulder  abduction    Shoulder adduction    Shoulder internal rotation    Shoulder external rotation    Elbow flexion    Elbow extension    Wrist flexion    Wrist extension    Wrist ulnar deviation    Wrist radial deviation    Wrist pronation    Wrist supination    (Blank rows = not tested)  UPPER EXTREMITY MMT:  MMT Right eval Left eval  Shoulder flexion    Shoulder extension    Shoulder abduction    Shoulder adduction    Shoulder internal rotation    Shoulder external rotation    Middle trapezius    Lower trapezius    Elbow flexion    Elbow extension    Wrist flexion    Wrist extension    Wrist ulnar deviation    Wrist radial deviation    Wrist pronation    Wrist supination    Grip strength (lbs)    (Blank rows = not tested)  SHOULDER SPECIAL TESTS: Impingement tests: {shoulder impingement test:25231:a} SLAP lesions: {SLAP lesions:25232} Instability tests: {shoulder instability test:25233} Rotator cuff assessment: {rotator cuff assessment:25234} Biceps assessment: {biceps assessment:25235}  JOINT MOBILITY TESTING:  ***  PALPATION:  ***                                                                                                                             TREATMENT DATE: ***   PATIENT EDUCATION: Education details: *** Person educated: {Person educated:25204} Education method: {Education Method:25205} Education comprehension: {Education Comprehension:25206}  HOME EXERCISE PROGRAM: ***  ASSESSMENT:  CLINICAL IMPRESSION: Patient is a *** y.o. *** who was seen today for physical therapy evaluation and treatment for ***.   OBJECTIVE IMPAIRMENTS: {opptimpairments:25111}.   ACTIVITY LIMITATIONS: {activitylimitations:27494}  PARTICIPATION LIMITATIONS: {participationrestrictions:25113}  PERSONAL FACTORS: {Personal factors:25162} are also affecting patient's functional outcome.   REHAB POTENTIAL: {rehabpotential:25112}  CLINICAL DECISION MAKING:  {clinical decision making:25114}  EVALUATION COMPLEXITY: {Evaluation complexity:25115}   GOALS: Goals reviewed with patient? {yes/no:20286}  SHORT TERM GOALS: Target date: ***  *** Baseline: Goal status: INITIAL  2.  *** Baseline:  Goal status: INITIAL  3.  *** Baseline:  Goal status: INITIAL  4.  *** Baseline:  Goal status: INITIAL  5.  *** Baseline:  Goal status: INITIAL  6.  *** Baseline:  Goal status: INITIAL  LONG TERM GOALS: Target date: ***  *** Baseline:  Goal status: INITIAL  2.  *** Baseline:  Goal status: INITIAL  3.  *** Baseline:  Goal status: INITIAL  4.  *** Baseline:  Goal status: INITIAL  5.  *** Baseline:  Goal status: INITIAL  6.  *** Baseline:  Goal status: INITIAL  PLAN:  PT FREQUENCY: {rehab frequency:25116}  PT DURATION: {rehab duration:25117}  PLANNED INTERVENTIONS: {rehab planned interventions:25118::"97110-Therapeutic exercises","97530- Therapeutic [phone]- Neuromuscular re-education","97535- Self YQMV","78469- Manual therapy"}  PLAN FOR NEXT SESSION: ***   Paislynn Hegstrom, PT 09/12/2023, 5:45 PM

## 2023-09-13 ENCOUNTER — Other Ambulatory Visit: Payer: Self-pay

## 2023-09-13 ENCOUNTER — Encounter: Payer: Self-pay | Admitting: Physical Therapy

## 2023-09-13 ENCOUNTER — Ambulatory Visit: Attending: Surgical | Admitting: Physical Therapy

## 2023-09-13 DIAGNOSIS — R2681 Unsteadiness on feet: Secondary | ICD-10-CM | POA: Insufficient documentation

## 2023-09-13 DIAGNOSIS — M6281 Muscle weakness (generalized): Secondary | ICD-10-CM | POA: Diagnosis not present

## 2023-09-13 DIAGNOSIS — M79601 Pain in right arm: Secondary | ICD-10-CM | POA: Insufficient documentation

## 2023-09-13 DIAGNOSIS — R600 Localized edema: Secondary | ICD-10-CM | POA: Diagnosis not present

## 2023-09-13 DIAGNOSIS — M25611 Stiffness of right shoulder, not elsewhere classified: Secondary | ICD-10-CM | POA: Diagnosis not present

## 2023-09-13 DIAGNOSIS — Z96611 Presence of right artificial shoulder joint: Secondary | ICD-10-CM | POA: Diagnosis not present

## 2023-09-18 ENCOUNTER — Encounter: Payer: Self-pay | Admitting: Physical Therapy

## 2023-09-18 ENCOUNTER — Ambulatory Visit: Payer: Self-pay | Admitting: Physical Therapy

## 2023-09-18 DIAGNOSIS — M25611 Stiffness of right shoulder, not elsewhere classified: Secondary | ICD-10-CM | POA: Diagnosis not present

## 2023-09-18 DIAGNOSIS — R600 Localized edema: Secondary | ICD-10-CM | POA: Diagnosis not present

## 2023-09-18 DIAGNOSIS — M6281 Muscle weakness (generalized): Secondary | ICD-10-CM

## 2023-09-18 DIAGNOSIS — M79601 Pain in right arm: Secondary | ICD-10-CM | POA: Diagnosis not present

## 2023-09-18 DIAGNOSIS — R2681 Unsteadiness on feet: Secondary | ICD-10-CM | POA: Diagnosis not present

## 2023-09-18 DIAGNOSIS — Z96611 Presence of right artificial shoulder joint: Secondary | ICD-10-CM | POA: Diagnosis not present

## 2023-09-18 NOTE — Therapy (Signed)
 OUTPATIENT PHYSICAL THERAPY SHOULDER TREATMENT   Patient Name: David [name] MRN: [id number] DOB:08/22/1950, 74 y.o., male Today's Date: 09/18/2023  END OF SESSION:  PT End of Session - 09/18/23 1354     Visit Number 2    Date for PT Re-Evaluation 11/08/23    Authorization - Visit Number 1    Progress Note Due on Visit 10    PT Start Time 1315    PT Stop Time 1355    PT Time Calculation (min) 40 min    Activity Tolerance Patient tolerated treatment well    Behavior During Therapy [facility] for tasks assessed/performed             Past Medical History:  Diagnosis Date   Arthritis    Balance problem    Barrett's esophagus    BBB (bundle branch block) 03/15/2016   Carpal tunnel syndrome    DDD (degenerative disc disease), lumbar 03/15/2016   Depression    Dysrhythmia    had some tachycardia with steroids   Elevated cholesterol 03/15/2016   Headache(784.0)    Ocular Migraines - takes Ambien  for it   Heart murmur    HTN (hypertension) 03/15/2016   Hyperlipemia    Hypertension    Malignant melanoma of right side of neck (HCC) 03/15/2016   73 years old   Neuralgia 03/15/2016   Ocular migraine    Osteoarthritis    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    RLS (restless legs syndrome)    Sleep apnea    uses CPAP   Snores    Status post gastric banding    Wears glasses    driving   Past Surgical History:  Procedure Laterality Date   ANTERIOR CERVICAL DECOMPRESSION/DISCECTOMY FUSION 4 LEVELS N/A 02/26/2020   Procedure: ANTERIOR CERVICAL DECOMPRESSION/DISCECTOMY FUSION, INTERBODY PROSTHESIS, PLATE/SCREWS CERVICAL THREE-FOUR CERVICAL FOUR-FIVE CERVICAL FIVE-SIX- CERVICAL SIX-SEVEN;  Surgeon: Garry Kansas, MD;  Location: [facility] OR;  Service: Neurosurgery;  Laterality: N/A;   CARPAL TUNNEL RELEASE Right 09/26/2012   Procedure: CARPAL TUNNEL RELEASE;  Surgeon: Milagros Alf, MD;  Location: [facility];  Service: Orthopedics;  Laterality: Right;   CARPAL TUNNEL RELEASE Left 10/24/2012   Procedure: CARPAL TUNNEL RELEASE;  Surgeon: Milagros Alf, MD;  Location: [facility];  Service: Orthopedics;  Laterality: Left;   CIRCUMCISION     as a baby   EXCISION MELANOMA WITH SENTINEL LYMPH NODE BIOPSY  1984   rt neck with mulpipal nodes and some muscle excised rt neck-shoulder   KNEE ARTHROPLASTY     knee replacement     LAPAROSCOPIC GASTRIC BANDING  03/15/2009   LAPAROSCOPIC GASTRIC BANDING     MUSCLE BIOPSY  2004   rt thigh to r/o myocitis   nerve conduction velosity test     REVERSE SHOULDER ARTHROPLASTY Right 08/21/2023   Procedure: ARTHROPLASTY, SHOULDER, TOTAL, REVERSE;  Surgeon: Jasmine Mesi, MD;  Location: [facility] OR;  Service: Orthopedics;  Laterality: Right;  RIGHT REVERSE SHOULDER ARTHROPLASTY   SHOULDER SURGERY Right 12/2021   TONSILLECTOMY     TOTAL KNEE ARTHROPLASTY Bilateral 12/05/2010   TRIGGER FINGER RELEASE Right 09/26/2012   Procedure: RELEASE TRIGGER FINGER/A-1 PULLEY RING AND SMALL ;  Surgeon: Milagros Alf, MD;  Location: [facility];  Service: Orthopedics;  Laterality: Right;   TRIGGER FINGER RELEASE Left 10/24/2012   Procedure: RELEASE TRIGGER FINGER/A-1 PULLEY LEFT MIDDLE AND  LEFT RING;  Surgeon: Milagros Alf, MD;  Location: [facility];  Service: Orthopedics;  Laterality: Left;   UPPER GI ENDOSCOPY     Patient Active Problem List   Diagnosis Date Noted   Rotator cuff arthropathy, right 08/21/2023   S/P reverse total shoulder arthroplasty, right 08/21/2023   Sepsis due to undetermined organism (HCC) 08/20/2022   Cervical spondylosis with radiculopathy 02/26/2020   SBO (small bowel obstruction) (HCC) 08/21/2017   Trochanteric bursitis of left hip 01/18/2017   Lateral epicondylitis, right elbow 01/18/2017   Rheumatoid arthritis (HCC) 03/15/2016   DJD (degenerative joint disease), cervical 03/15/2016   DDD  (degenerative disc disease), lumbar 03/15/2016   HTN (hypertension) 03/15/2016   Obesity 03/15/2016   Elevated cholesterol 03/15/2016   Neuralgia 03/15/2016   Malignant melanoma of right side of neck (HCC) 03/15/2016   BBB (bundle branch block) 03/15/2016   High risk medication use 03/15/2016   Osteoarthritis of lumbar spine 03/15/2016   Primary osteoarthritis of both hands 03/15/2016   Primary osteoarthritis of both knees 03/15/2016   Mesenteric adenitis 06/29/2015    PCP: Stamey, Lowell Rude, FNP   REFERRING PROVIDER: Lorenza Romans, FNP   REFERRING DIAG: [phone] (ICD-10-CM) - S/P reverse total shoulder arthroplasty, Right (With subscapularis repair)  THERAPY DIAG:  Stiffness of right shoulder, not elsewhere classified  Muscle weakness (generalized)  Localized edema  Rationale for Evaluation and Treatment: Rehabilitation  ONSET DATE: 08/21/23 DOS  SUBJECTIVE:                                                                                                                                                                                      SUBJECTIVE STATEMENT: Pt states "I'm doing pretty good". He states he does not have pain unless he "moves a certain way" Hand dominance: Right  PERTINENT HISTORY: H/o falls,  ACDF, B TKA, HTN, h/o malignant melanoma in R neck affecting R trapezius muscle and R UE ROM  PAIN:  Are you having pain? No  PRECAUTIONS: Shoulder Okay to very lightly lift with the operative extremity but no lifting anything heavier than a coffee cup or cell phone. Start physical therapy to focus on passive range of motion and active range of motion with deltoid isometrics. Do not want to externally rotate past 30 degrees to protect subscapularis repair.   RED FLAGS: None   WEIGHT BEARING RESTRICTIONS: Yes R shoulder  FALLS:  Has patient fallen in last 6 months? Yes. Number of falls 4 tripping over things in cluttered garage  LIVING ENVIRONMENT: Lives  with: lives with their spouse Lives in: House/apartment Stairs: Yes: Internal: 15 steps; on left going up and  bilateral but cannot reach both and External: 3 steps; no railing on deck Has following equipment at home: Single point cane and Crutches  OCCUPATION: retired  PLOF: Independent  PATIENT GOALS:to be able to sleep in bed on side without pain, currently on couch or in bed on back  NEXT MD VISIT: May 14?  OBJECTIVE:  Note: Objective measures were completed at Evaluation unless otherwise noted.  DIAGNOSTIC FINDINGS:  none  PATIENT SURVEYS:  Quick Dash  72.7 / 100 = 72.7 %  COGNITION: Overall cognitive status: Within functional limits for tasks assessed     SENSATION: Mild pull in post distal/lateral  wrist  POSTURE: Rounded shoulders  UPPER EXTREMITY ROM:   A/P ROM Right eval Left eval  Shoulder flexion 130/142   Shoulder extension    Shoulder abduction 100 P   Shoulder adduction    Shoulder internal rotation    Shoulder external rotation 30/30   Elbow flexion    Elbow extension -23   Wrist flexion WFL   Wrist extension    Wrist ulnar deviation    Wrist radial deviation    Wrist pronation    Wrist supination    (Blank rows = not tested) R wrist limited since melanoma surgery  UPPER EXTREMITY MMT: NT due to surgery  MMT Right eval Left eval  Shoulder flexion    Shoulder extension    Shoulder abduction    Shoulder adduction    Shoulder internal rotation    Shoulder external rotation    Middle trapezius    Lower trapezius    Elbow flexion    Elbow extension    Wrist flexion    Wrist extension    Wrist ulnar deviation    Wrist radial deviation    Wrist pronation    Wrist supination    Grip strength (lbs)    (Blank rows = not tested)   PALPATION:  TTP in ant and post shoulder, wrist extensors                                                                                                                             [facility] Adult PT  Treatment:                                                DATE: 09/18/23 Therapeutic Exercise/Activity: Pulley flexion x 2 min Pulley abduction x 2 min Isometrics: extension, flexion, abd all 10 x 3 sec hold Reactive isometrics green TB ext, flex, abd x 5 each Scap squeeze 2 x 10 Finger ladder flexion x 3 Manual Therapy: PROM Rt shoulder flex and abd per protocol   TREATMENT DATE:  09/13/23 See pt ed and HEP   PATIENT EDUCATION: Education details: PT eval findings, anticipated POC, initial HEP, and reviewed precautions and limitations  Person educated: Patient Education method:  Explanation, Demonstration, and Handouts Education comprehension: verbalized understanding and returned demonstration  HOME EXERCISE PROGRAM: Access Code: [email or web address] URL: [email or web address] Date: 09/18/2023 Prepared by: Lowery Rue  Exercises - Seated Shoulder External Rotation AAROM with Cane and Hand in Neutral  - 1-2 x daily - 3 x weekly - 2 sets - 10 reps - Supine Shoulder Flexion Extension AAROM with Dowel  - 2 x daily - 7 x weekly - 1-3 sets - 10 reps - Wrist Flexion Extension AROM with Fingers Curled and Palm Down  - 2 x daily - 7 x weekly - 2 sets - 10 reps - Forearm Pronation and Supination With Arm Supported  - 2 x daily - 7 x weekly - 2 sets - 10 reps - Seated Scapular Retraction  - 1 x daily - 7 x weekly - 3 sets - 10 reps - Isometric Shoulder Abduction at Wall  - 1 x daily - 7 x weekly - 1 sets - 10 reps - 3 seconds hold - Isometric Shoulder Extension at Wall  - 1 x daily - 7 x weekly - 1 sets - 10 reps - 3 seconds hold - Isometric Shoulder Flexion at Wall  - 1 x daily - 7 x weekly - 1 sets - 10 reps - 3 seconds hold  ASSESSMENT:  CLINICAL IMPRESSION: Updated HEP to include isometrics. Pt with good tolerance to all activities during session today. PT continued to educate pt on goals of treatment, protocol and POC   OBJECTIVE IMPAIRMENTS: decreased activity tolerance,  decreased balance, decreased ROM, decreased strength, increased edema, increased muscle spasms, impaired flexibility, impaired sensation, impaired UE functional use, postural dysfunction, obesity, and pain.     GOALS: Goals reviewed with patient? Yes  SHORT TERM GOALS: Target date: 10/11/2023   Patient will be independent with initial HEP.  Baseline:  Goal status: INITIAL  2. Balance screen performed and appropriate goals set if indicated.  Baseline:  Goal status: INITIAL    LONG TERM GOALS: Target date: 11/08/2023   Patient will be independent with advanced/ongoing HEP to improve outcomes and carryover.  Baseline:  Goal status: INITIAL  2.   Decreased Quick Dash by >= 10 points demonstrating improved functional ability.  Baseline: 72.7 Goal status: INITIAL  3.  Patient to improve R shoulder AROM to [facility] without pain provocation to allow for increased ease of ADLs.  Baseline:  Goal status: INITIAL  4.  Patient will demonstrate improved UE strength to 4+/5 to perform ADLS  Baseline:  Goal status: INITIAL  5. Patient to report no pain in R forearm with ADLs.  Baseline:  Goal status: INITIAL  6.  Patient will score >19/24 on the DGI to decrease fall risk.   Baseline: TBD Goal status: INITIAL   PLAN:  PT FREQUENCY: 2x/week  PT DURATION: 8 weeks  PLANNED INTERVENTIONS: 97164- PT Re-evaluation, 97110-Therapeutic exercises, 97530- Therapeutic activity, [id number]- Neuromuscular re-education, 97535- Self Care, 16109- Manual therapy, [id number]- Electrical stimulation (unattended), 97016- Vasopneumatic device, 97033- Ionotophoresis 4mg /ml Dexamethasone , Patient/Family education, Balance training, Taping, Dry Needling, Joint mobilization, Spinal mobilization, Cryotherapy, and Moist heat  PLAN FOR NEXT SESSION: P/AROM per protocol, deltoid isometrics.NO ER past 30 degrees to protect subscapularis repair.  Elbow and wrist ROM  Lowery Rue, PT,DPT04/29/251:55 PM

## 2023-09-20 ENCOUNTER — Encounter: Payer: Self-pay | Admitting: Physical Therapy

## 2023-09-20 ENCOUNTER — Ambulatory Visit: Payer: Self-pay | Attending: Surgical | Admitting: Physical Therapy

## 2023-09-20 DIAGNOSIS — R600 Localized edema: Secondary | ICD-10-CM | POA: Insufficient documentation

## 2023-09-20 DIAGNOSIS — M6281 Muscle weakness (generalized): Secondary | ICD-10-CM | POA: Insufficient documentation

## 2023-09-20 DIAGNOSIS — R2681 Unsteadiness on feet: Secondary | ICD-10-CM | POA: Diagnosis not present

## 2023-09-20 DIAGNOSIS — M25611 Stiffness of right shoulder, not elsewhere classified: Secondary | ICD-10-CM | POA: Diagnosis not present

## 2023-09-20 DIAGNOSIS — M79601 Pain in right arm: Secondary | ICD-10-CM | POA: Diagnosis not present

## 2023-09-20 NOTE — Therapy (Signed)
 OUTPATIENT PHYSICAL THERAPY SHOULDER TREATMENT   Patient Name: David [name] MRN: [id number] DOB:1950/09/26, 73 y.o., male Today's Date: 09/20/2023  END OF SESSION:  PT End of Session - 09/20/23 1148     Visit Number 3    Date for PT Re-Evaluation 11/08/23    Authorization Type [employer] MCR    Progress Note Due on Visit 10    PT Start Time 1148    PT Stop Time 1230    PT Time Calculation (min) 42 min    Activity Tolerance Patient tolerated treatment well    Behavior During Therapy WFL for tasks assessed/performed             Past Medical History:  Diagnosis Date   Arthritis    Balance problem    Barrett's esophagus    BBB (bundle branch block) 03/15/2016   Carpal tunnel syndrome    DDD (degenerative disc disease), lumbar 03/15/2016   Depression    Dysrhythmia    had some tachycardia with steroids   Elevated cholesterol 03/15/2016   Headache(784.0)    Ocular Migraines - takes Ambien  for it   Heart murmur    HTN (hypertension) 03/15/2016   Hyperlipemia    Hypertension    Malignant melanoma of right side of neck (HCC) 03/15/2016   73 years old   Neuralgia 03/15/2016   Ocular migraine    Osteoarthritis    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    RLS (restless legs syndrome)    Sleep apnea    uses CPAP   Snores    Status post gastric banding    Wears glasses    driving   Past Surgical History:  Procedure Laterality Date   ANTERIOR CERVICAL DECOMPRESSION/DISCECTOMY FUSION 4 LEVELS N/A 02/26/2020   Procedure: ANTERIOR CERVICAL DECOMPRESSION/DISCECTOMY FUSION, INTERBODY PROSTHESIS, PLATE/SCREWS CERVICAL THREE-FOUR CERVICAL FOUR-FIVE CERVICAL FIVE-SIX- CERVICAL SIX-SEVEN;  Surgeon: Garry Kansas, MD;  Location: [facility] OR;  Service: Neurosurgery;  Laterality: N/A;   CARPAL TUNNEL RELEASE Right 09/26/2012   Procedure: CARPAL TUNNEL RELEASE;  Surgeon: Milagros Alf, MD;  Location: [facility];  Service: Orthopedics;  Laterality: Right;   CARPAL TUNNEL RELEASE Left 10/24/2012   Procedure: CARPAL TUNNEL RELEASE;  Surgeon: Milagros Alf, MD;  Location: [facility];  Service: Orthopedics;  Laterality: Left;   CIRCUMCISION     as a baby   EXCISION MELANOMA WITH SENTINEL LYMPH NODE BIOPSY  1984   rt neck with mulpipal nodes and some muscle excised rt neck-shoulder   KNEE ARTHROPLASTY     knee replacement     LAPAROSCOPIC GASTRIC BANDING  03/15/2009   LAPAROSCOPIC GASTRIC BANDING     MUSCLE BIOPSY  2004   rt thigh to r/o myocitis   nerve conduction velosity test     REVERSE SHOULDER ARTHROPLASTY Right 08/21/2023   Procedure: ARTHROPLASTY, SHOULDER, TOTAL, REVERSE;  Surgeon: Jasmine Mesi, MD;  Location: [facility] OR;  Service: Orthopedics;  Laterality: Right;  RIGHT REVERSE SHOULDER ARTHROPLASTY   SHOULDER SURGERY Right 12/2021   TONSILLECTOMY     TOTAL KNEE ARTHROPLASTY Bilateral 12/05/2010   TRIGGER FINGER RELEASE Right 09/26/2012   Procedure: RELEASE TRIGGER FINGER/A-1 PULLEY RING AND SMALL ;  Surgeon: Milagros Alf, MD;  Location: [facility];  Service: Orthopedics;  Laterality: Right;   TRIGGER FINGER RELEASE Left 10/24/2012   Procedure: RELEASE TRIGGER FINGER/A-1 PULLEY LEFT MIDDLE AND LEFT  RING;  Surgeon: Milagros Alf, MD;  Location: [facility];  Service: Orthopedics;  Laterality: Left;   UPPER GI ENDOSCOPY     Patient Active Problem List   Diagnosis Date Noted   Rotator cuff arthropathy, right 08/21/2023   S/P reverse total shoulder arthroplasty, right 08/21/2023   Sepsis due to undetermined organism (HCC) 08/20/2022   Cervical spondylosis with radiculopathy 02/26/2020   SBO (small bowel obstruction) (HCC) 08/21/2017   Trochanteric bursitis of left hip 01/18/2017   Lateral epicondylitis, right elbow 01/18/2017   Rheumatoid arthritis (HCC) 03/15/2016   DJD (degenerative joint disease), cervical 03/15/2016   DDD  (degenerative disc disease), lumbar 03/15/2016   HTN (hypertension) 03/15/2016   Obesity 03/15/2016   Elevated cholesterol 03/15/2016   Neuralgia 03/15/2016   Malignant melanoma of right side of neck (HCC) 03/15/2016   BBB (bundle branch block) 03/15/2016   High risk medication use 03/15/2016   Osteoarthritis of lumbar spine 03/15/2016   Primary osteoarthritis of both hands 03/15/2016   Primary osteoarthritis of both knees 03/15/2016   Mesenteric adenitis 06/29/2015    PCP: Stamey, Lowell Rude, FNP   REFERRING PROVIDER: Casilda Clayman, PA-C   REFERRING DIAG: [phone] (ICD-10-CM) - S/P reverse total shoulder arthroplasty, Right (With subscapularis repair)  THERAPY DIAG:  Stiffness of right shoulder, not elsewhere classified  Muscle weakness (generalized)  Localized edema  Pain in right arm  Rationale for Evaluation and Treatment: Rehabilitation  ONSET DATE: 08/21/23 DOS  SUBJECTIVE:                                                                                                                                                                                      SUBJECTIVE STATEMENT: Increased pain last night. I couldn't sleep. Hand dominance: Right  PERTINENT HISTORY: H/o falls,  ACDF, B TKA, HTN, h/o malignant melanoma in R neck affecting R trapezius muscle and R UE ROM  PAIN:  Are you having pain? No  PRECAUTIONS: Shoulder Okay to very lightly lift with the operative extremity but no lifting anything heavier than a coffee cup or cell phone. Start physical therapy to focus on passive range of motion and active range of motion with deltoid isometrics. Do not want to externally rotate past 30 degrees to protect subscapularis repair.   RED FLAGS: None   WEIGHT BEARING RESTRICTIONS: Yes R shoulder  FALLS:  Has patient fallen in last 6 months? Yes. Number of falls 4 tripping over things in cluttered garage  LIVING ENVIRONMENT: Lives with: lives with their  spouse Lives in: House/apartment Stairs: Yes: Internal: 15 steps; on left going up and bilateral but cannot reach both and External: 3  steps; no railing on deck Has following equipment at home: Single point cane and Crutches  OCCUPATION: retired  PLOF: Independent  PATIENT GOALS:to be able to sleep in bed on side without pain, currently on couch or in bed on back  NEXT MD VISIT: May 14?  OBJECTIVE:  Note: Objective measures were completed at Evaluation unless otherwise noted.  DIAGNOSTIC FINDINGS:  none  PATIENT SURVEYS:  Quick Dash  72.7 / 100 = 72.7 %  COGNITION: Overall cognitive status: Within functional limits for tasks assessed     SENSATION: Mild pull in post distal/lateral  wrist  POSTURE: Rounded shoulders  UPPER EXTREMITY ROM:   A/P ROM Right eval Left eval  Shoulder flexion 130/142   Shoulder extension    Shoulder abduction 100 P   Shoulder adduction    Shoulder internal rotation    Shoulder external rotation 30/30   Elbow flexion    Elbow extension -23   Wrist flexion WFL   Wrist extension    Wrist ulnar deviation    Wrist radial deviation    Wrist pronation    Wrist supination    (Blank rows = not tested) R wrist limited since melanoma surgery  UPPER EXTREMITY MMT: NT due to surgery  MMT Right eval Left eval  Shoulder flexion    Shoulder extension    Shoulder abduction    Shoulder adduction    Shoulder internal rotation    Shoulder external rotation    Middle trapezius    Lower trapezius    Elbow flexion    Elbow extension    Wrist flexion    Wrist extension    Wrist ulnar deviation    Wrist radial deviation    Wrist pronation    Wrist supination    Grip strength (lbs)    (Blank rows = not tested)   PALPATION:  TTP in ant and post shoulder, wrist extensors     [facility] Adult PT Treatment:                                                DATE:09/20/23 Therapeutic Exercise/Activity: Pulley flexion x 2 min Pulley  scaption/abduction x 2 min Isometrics: extension, flexion, abd all 10 x 10 sec hold Reactive isometrics green TB ext, flex, abd x 10 each Scap squeeze 2 x 10 Finger ladder flexion x 5 Manual Therapy: PROM Rt shoulder flex and abd per protocol IASTM to R upper arm with EDGE tool                                                                                                                          [facility] Adult PT Treatment:  DATE: 09/18/23 Therapeutic Exercise/Activity: Pulley flexion x 2 min Pulley abduction x 2 min Isometrics: extension, flexion, abd all 10 x 3 sec hold Reactive isometrics green TB ext, flex, abd x 5 each Scap squeeze 2 x 10 Finger ladder flexion x 3 Manual Therapy: PROM Rt shoulder flex and abd per protocol   TREATMENT DATE:  09/13/23 See pt ed and HEP   PATIENT EDUCATION: Education details: PT eval findings, anticipated POC, initial HEP, and reviewed precautions and limitations  Person educated: Patient Education method: Explanation, Demonstration, and Handouts Education comprehension: verbalized understanding and returned demonstration  HOME EXERCISE PROGRAM: Access Code: [email or web address] URL: [email or web address] Date: 09/18/2023 Prepared by: Lowery Rue  Exercises - Seated Shoulder External Rotation AAROM with Cane and Hand in Neutral  - 1-2 x daily - 3 x weekly - 2 sets - 10 reps - Supine Shoulder Flexion Extension AAROM with Dowel  - 2 x daily - 7 x weekly - 1-3 sets - 10 reps - Wrist Flexion Extension AROM with Fingers Curled and Palm Down  - 2 x daily - 7 x weekly - 2 sets - 10 reps - Forearm Pronation and Supination With Arm Supported  - 2 x daily - 7 x weekly - 2 sets - 10 reps - Seated Scapular Retraction  - 1 x daily - 7 x weekly - 3 sets - 10 reps - Isometric Shoulder Abduction at Wall  - 1 x daily - 7 x weekly - 1 sets - 10 reps - 3 seconds hold - Isometric Shoulder Extension at Wall  - 1  x daily - 7 x weekly - 1 sets - 10 reps - 3 seconds hold - Isometric Shoulder Flexion at Wall  - 1 x daily - 7 x weekly - 1 sets - 10 reps - 3 seconds hold  ASSESSMENT:  CLINICAL IMPRESSION: Patient reports increased pain since yesterday from doing his exercises. He is using his body with some of his isometrics and pushing too hard in others. Corrected form and advised that they should not be painful. Also advised that he reduce reps if pain continues to increase. Good response to IASTM at end of session and also to isometric walkouts.   OBJECTIVE IMPAIRMENTS: decreased activity tolerance, decreased balance, decreased ROM, decreased strength, increased edema, increased muscle spasms, impaired flexibility, impaired sensation, impaired UE functional use, postural dysfunction, obesity, and pain.     GOALS: Goals reviewed with patient? Yes  SHORT TERM GOALS: Target date: 10/11/2023   Patient will be independent with initial HEP.  Baseline:  Goal status: INITIAL  2. Balance screen performed and appropriate goals set if indicated.  Baseline:  Goal status: INITIAL    LONG TERM GOALS: Target date: 11/08/2023   Patient will be independent with advanced/ongoing HEP to improve outcomes and carryover.  Baseline:  Goal status: INITIAL  2.   Decreased Quick Dash by >= 10 points demonstrating improved functional ability.  Baseline: 72.7 Goal status: INITIAL  3.  Patient to improve R shoulder AROM to [facility] without pain provocation to allow for increased ease of ADLs.  Baseline:  Goal status: INITIAL  4.  Patient will demonstrate improved UE strength to 4+/5 to perform ADLS  Baseline:  Goal status: INITIAL  5. Patient to report no pain in R forearm with ADLs.  Baseline:  Goal status: INITIAL  6.  Patient will score >19/24 on the DGI to decrease fall risk.   Baseline: TBD Goal status: INITIAL   PLAN:  PT FREQUENCY: 2x/week  PT DURATION: 8 weeks  PLANNED INTERVENTIONS: 97164- PT  Re-evaluation, 97110-Therapeutic exercises, 97530- Therapeutic activity, [id number]- Neuromuscular re-education, 97535- Self Care, 29562- Manual therapy, [id number]- Electrical stimulation (unattended), 97016- Vasopneumatic device, 97033- Ionotophoresis 4mg /ml Dexamethasone , Patient/Family education, Balance training, Taping, Dry Needling, Joint mobilization, Spinal mobilization, Cryotherapy, and Moist heat  PLAN FOR NEXT SESSION: P/AROM per protocol, deltoid isometrics.NO ER past 30 degrees to protect subscapularis repair.  Elbow and wrist ROM  Jinx Mourning, PT 09/20/2510:43 PM

## 2023-09-24 ENCOUNTER — Ambulatory Visit: Payer: Self-pay

## 2023-09-24 DIAGNOSIS — D485 Neoplasm of uncertain behavior of skin: Secondary | ICD-10-CM | POA: Diagnosis not present

## 2023-09-24 DIAGNOSIS — D0462 Carcinoma in situ of skin of left upper limb, including shoulder: Secondary | ICD-10-CM | POA: Diagnosis not present

## 2023-09-24 DIAGNOSIS — C44519 Basal cell carcinoma of skin of other part of trunk: Secondary | ICD-10-CM | POA: Diagnosis not present

## 2023-09-24 DIAGNOSIS — C44319 Basal cell carcinoma of skin of other parts of face: Secondary | ICD-10-CM | POA: Diagnosis not present

## 2023-09-24 DIAGNOSIS — C44619 Basal cell carcinoma of skin of left upper limb, including shoulder: Secondary | ICD-10-CM | POA: Diagnosis not present

## 2023-09-24 DIAGNOSIS — L905 Scar conditions and fibrosis of skin: Secondary | ICD-10-CM | POA: Diagnosis not present

## 2023-09-24 DIAGNOSIS — L814 Other melanin hyperpigmentation: Secondary | ICD-10-CM | POA: Diagnosis not present

## 2023-09-24 DIAGNOSIS — L570 Actinic keratosis: Secondary | ICD-10-CM | POA: Diagnosis not present

## 2023-09-24 DIAGNOSIS — Z85828 Personal history of other malignant neoplasm of skin: Secondary | ICD-10-CM | POA: Diagnosis not present

## 2023-09-24 DIAGNOSIS — Z85820 Personal history of malignant melanoma of skin: Secondary | ICD-10-CM | POA: Diagnosis not present

## 2023-09-24 DIAGNOSIS — D045 Carcinoma in situ of skin of trunk: Secondary | ICD-10-CM | POA: Diagnosis not present

## 2023-09-25 ENCOUNTER — Encounter: Payer: Self-pay | Admitting: Physical Therapy

## 2023-09-26 NOTE — Therapy (Signed)
 OUTPATIENT PHYSICAL THERAPY SHOULDER TREATMENT   Patient Name: David [name] MRN: [id number] DOB:1951-01-27, 73 y.o., male Today's Date: 09/27/2023  END OF SESSION:  PT End of Session - 09/27/23 1011     Visit Number 4    Date for PT Re-Evaluation 11/08/23    Authorization Type [employer] MCR    Progress Note Due on Visit 10    PT Start Time 1011    PT Stop Time 1053    PT Time Calculation (min) 42 min    Activity Tolerance Patient tolerated treatment well    Behavior During Therapy WFL for tasks assessed/performed              Past Medical History:  Diagnosis Date   Arthritis    Balance problem    Barrett's esophagus    BBB (bundle branch block) 03/15/2016   Carpal tunnel syndrome    DDD (degenerative disc disease), lumbar 03/15/2016   Depression    Dysrhythmia    had some tachycardia with steroids   Elevated cholesterol 03/15/2016   Headache(784.0)    Ocular Migraines - takes Ambien  for it   Heart murmur    HTN (hypertension) 03/15/2016   Hyperlipemia    Hypertension    Malignant melanoma of right side of neck (HCC) 03/15/2016   73 years old   Neuralgia 03/15/2016   Ocular migraine    Osteoarthritis    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    RLS (restless legs syndrome)    Sleep apnea    uses CPAP   Snores    Status post gastric banding    Wears glasses    driving   Past Surgical History:  Procedure Laterality Date   ANTERIOR CERVICAL DECOMPRESSION/DISCECTOMY FUSION 4 LEVELS N/A 02/26/2020   Procedure: ANTERIOR CERVICAL DECOMPRESSION/DISCECTOMY FUSION, INTERBODY PROSTHESIS, PLATE/SCREWS CERVICAL THREE-FOUR CERVICAL FOUR-FIVE CERVICAL FIVE-SIX- CERVICAL SIX-SEVEN;  Surgeon: Garry Kansas, MD;  Location: [facility] OR;  Service: Neurosurgery;  Laterality: N/A;   CARPAL TUNNEL RELEASE Right 09/26/2012   Procedure: CARPAL TUNNEL RELEASE;  Surgeon: Milagros Alf, MD;  Location: [facility];  Service: Orthopedics;  Laterality: Right;   CARPAL TUNNEL RELEASE Left 10/24/2012   Procedure: CARPAL TUNNEL RELEASE;  Surgeon: Milagros Alf, MD;  Location: [facility];  Service: Orthopedics;  Laterality: Left;   CIRCUMCISION     as a baby   EXCISION MELANOMA WITH SENTINEL LYMPH NODE BIOPSY  1984   rt neck with mulpipal nodes and some muscle excised rt neck-shoulder   KNEE ARTHROPLASTY     knee replacement     LAPAROSCOPIC GASTRIC BANDING  03/15/2009   LAPAROSCOPIC GASTRIC BANDING     MUSCLE BIOPSY  2004   rt thigh to r/o myocitis   nerve conduction velosity test     REVERSE SHOULDER ARTHROPLASTY Right 08/21/2023   Procedure: ARTHROPLASTY, SHOULDER, TOTAL, REVERSE;  Surgeon: Jasmine Mesi, MD;  Location: [facility] OR;  Service: Orthopedics;  Laterality: Right;  RIGHT REVERSE SHOULDER ARTHROPLASTY   SHOULDER SURGERY Right 12/2021   TONSILLECTOMY     TOTAL KNEE ARTHROPLASTY Bilateral 12/05/2010   TRIGGER FINGER RELEASE Right 09/26/2012   Procedure: RELEASE TRIGGER FINGER/A-1 PULLEY RING AND SMALL ;  Surgeon: Milagros Alf, MD;  Location: [facility];  Service: Orthopedics;  Laterality: Right;   TRIGGER FINGER RELEASE Left 10/24/2012   Procedure: RELEASE TRIGGER FINGER/A-1 PULLEY LEFT MIDDLE AND  LEFT RING;  Surgeon: Milagros Alf, MD;  Location: [facility];  Service: Orthopedics;  Laterality: Left;   UPPER GI ENDOSCOPY     Patient Active Problem List   Diagnosis Date Noted   Rotator cuff arthropathy, right 08/21/2023   S/P reverse total shoulder arthroplasty, right 08/21/2023   Sepsis due to undetermined organism (HCC) 08/20/2022   Cervical spondylosis with radiculopathy 02/26/2020   SBO (small bowel obstruction) (HCC) 08/21/2017   Trochanteric bursitis of left hip 01/18/2017   Lateral epicondylitis, right elbow 01/18/2017   Rheumatoid arthritis (HCC) 03/15/2016   DJD (degenerative joint disease), cervical 03/15/2016   DDD  (degenerative disc disease), lumbar 03/15/2016   HTN (hypertension) 03/15/2016   Obesity 03/15/2016   Elevated cholesterol 03/15/2016   Neuralgia 03/15/2016   Malignant melanoma of right side of neck (HCC) 03/15/2016   BBB (bundle branch block) 03/15/2016   High risk medication use 03/15/2016   Osteoarthritis of lumbar spine 03/15/2016   Primary osteoarthritis of both hands 03/15/2016   Primary osteoarthritis of both knees 03/15/2016   Mesenteric adenitis 06/29/2015    PCP: Stamey, Lowell Rude, FNP   REFERRING PROVIDER: Casilda Clayman, PA-C   REFERRING DIAG: [phone] (ICD-10-CM) - S/P reverse total shoulder arthroplasty, Right (With subscapularis repair)  THERAPY DIAG:  Stiffness of right shoulder, not elsewhere classified  Muscle weakness (generalized)  Localized edema  Pain in right arm  Unsteadiness on feet  Rationale for Evaluation and Treatment: Rehabilitation  ONSET DATE: 08/21/23 DOS  SUBJECTIVE:                                                                                                                                                                                      SUBJECTIVE STATEMENT: I'm having some pain when sliding the finger downward on the wall. 2-3/10 Hand dominance: Right  PERTINENT HISTORY: H/o falls,  ACDF, B TKA, HTN, h/o malignant melanoma in R neck affecting R trapezius muscle and R UE ROM  PAIN:  Are you having pain? No  PRECAUTIONS: Shoulder Okay to very lightly lift with the operative extremity but no lifting anything heavier than a coffee cup or cell phone. Start physical therapy to focus on passive range of motion and active range of motion with deltoid isometrics. Do not want to externally rotate past 30 degrees to protect subscapularis repair.   RED FLAGS: None   WEIGHT BEARING RESTRICTIONS: Yes R shoulder  FALLS:  Has patient fallen in last 6 months? Yes. Number of falls 4 tripping over things in cluttered garage  LIVING  ENVIRONMENT: Lives with: lives with their spouse Lives in: House/apartment Stairs: Yes: Internal: 15 steps; on left  going up and bilateral but cannot reach both and External: 3 steps; no railing on deck Has following equipment at home: Single point cane and Crutches  OCCUPATION: retired  PLOF: Independent  PATIENT GOALS:to be able to sleep in bed on side without pain, currently on couch or in bed on back  NEXT MD VISIT: May 14?  OBJECTIVE:  Note: Objective measures were completed at Evaluation unless otherwise noted.  DIAGNOSTIC FINDINGS:  none  PATIENT SURVEYS:  Quick Dash  72.7 / 100 = 72.7 %  COGNITION: Overall cognitive status: Within functional limits for tasks assessed     SENSATION: Mild pull in post distal/lateral  wrist  POSTURE: Rounded shoulders  UPPER EXTREMITY ROM:   A/P ROM Right eval Left eval Left 5/8//25  Shoulder flexion 130/142    Shoulder extension     Shoulder abduction 100 P    Shoulder adduction     Shoulder internal rotation     Shoulder external rotation 30/30    Elbow flexion     Elbow extension -23  -16  Wrist flexion WFL    Wrist extension     Wrist ulnar deviation     Wrist radial deviation     Wrist pronation     Wrist supination     (Blank rows = not tested) R wrist limited since melanoma surgery  UPPER EXTREMITY MMT: NT due to surgery  MMT Right eval Left eval  Shoulder flexion    Shoulder extension    Shoulder abduction    Shoulder adduction    Shoulder internal rotation    Shoulder external rotation    Middle trapezius    Lower trapezius    Elbow flexion    Elbow extension    Wrist flexion    Wrist extension    Wrist ulnar deviation    Wrist radial deviation    Wrist pronation    Wrist supination    Grip strength (lbs)    (Blank rows = not tested)   PALPATION:  TTP in ant and post shoulder, wrist extensors     [facility] Adult PT Treatment:                                                 DATE:09/27/23 Therapeutic Exercise/Activity: Pulley flexion x 2 min Pulley scaption/abduction x 2 min Isometrics: ER, flexion, abd all 10 x 10 sec hold Reactive isometrics TB red ER, green flex and abd x 10 each Scap squeeze 2 x 10 Finger ladder flexion x 10 with scapular retraction and cues to avoid lumbar extension Manual Therapy: PROM/stretching R wrist flex/ext, forearm pro/sup and elbow extension    [facility] Adult PT Treatment:                                                DATE:09/20/23 Therapeutic Exercise/Activity: Pulley flexion x 2 min Pulley scaption/abduction x 2 min Isometrics: extension, flexion, abd all 10 x 10 sec hold Reactive isometrics green TB ext, flex, abd x 10 each Scap squeeze 2 x 10 Finger ladder flexion x 5 Manual Therapy: PROM Rt shoulder flex and abd per protocol IASTM to R upper arm with EDGE tool  [facility] Adult PT Treatment:                                                DATE: 09/18/23 Therapeutic Exercise/Activity: Pulley flexion x 2 min Pulley abduction x 2 min Isometrics: extension, flexion, abd all 10 x 3 sec hold Reactive isometrics green TB ext, flex, abd x 5 each Scap squeeze 2 x 10 Finger ladder flexion x 3 Manual Therapy: PROM Rt shoulder flex and abd per protocol   TREATMENT DATE:  09/13/23 See pt ed and HEP   PATIENT EDUCATION: Education details: PT eval findings, anticipated POC, initial HEP, and reviewed precautions and limitations  Person educated: Patient Education method: Explanation, Demonstration, and Handouts Education comprehension: verbalized understanding and returned demonstration  HOME EXERCISE PROGRAM: Access Code: [email or web address] URL: [email or web address] Date: 09/18/2023 Prepared by: Lowery Rue  Exercises - Seated Shoulder External Rotation AAROM with Cane and Hand in Neutral  - 1-2 x  daily - 3 x weekly - 2 sets - 10 reps - Supine Shoulder Flexion Extension AAROM with Dowel  - 2 x daily - 7 x weekly - 1-3 sets - 10 reps - Wrist Flexion Extension AROM with Fingers Curled and Palm Down  - 2 x daily - 7 x weekly - 2 sets - 10 reps - Forearm Pronation and Supination With Arm Supported  - 2 x daily - 7 x weekly - 2 sets - 10 reps - Seated Scapular Retraction  - 1 x daily - 7 x weekly - 3 sets - 10 reps - Isometric Shoulder Abduction at Wall  - 1 x daily - 7 x weekly - 1 sets - 10 reps - 3 seconds hold - Isometric Shoulder Extension at Wall  - 1 x daily - 7 x weekly - 1 sets - 10 reps - 3 seconds hold - Isometric Shoulder Flexion at Wall  - 1 x daily - 7 x weekly - 1 sets - 10 reps - 3 seconds hold  ASSESSMENT:  CLINICAL IMPRESSION: Patient presents with complaints of mild pain with wall slides on return. Advised scapular retraction which helped in the clinic. Also advised supporting arm with left hand. We focused on elbow range of motion today with excellent results. He measured 7 degree improvement at end of session. He will benefit from ongoing passive stretching here. Advised patient to perform active wrist and forearm ROM at home. He continues to demonstrate potential for improvement and would benefit from continued skilled therapy to address impairments.    OBJECTIVE IMPAIRMENTS: decreased activity tolerance, decreased balance, decreased ROM, decreased strength, increased edema, increased muscle spasms, impaired flexibility, impaired sensation, impaired UE functional use, postural dysfunction, obesity, and pain.     GOALS: Goals reviewed with patient? Yes  SHORT TERM GOALS: Target date: 10/11/2023   Patient will be independent with initial HEP.  Baseline:  Goal status: IN PROGRESS  2. Balance screen performed and appropriate goals set if indicated.  Baseline:  Goal status: INITIAL    LONG TERM GOALS: Target date: 11/08/2023   Patient will be independent with  advanced/ongoing HEP to improve outcomes and carryover.  Baseline:  Goal status: INITIAL  2.   Decreased Quick Dash by >= 10 points demonstrating improved functional ability.  Baseline: 72.7 Goal status: INITIAL  3.  Patient to improve R shoulder AROM to [facility] without pain provocation to allow for  increased ease of ADLs.  Baseline:  Goal status: INITIAL  4.  Patient will demonstrate improved UE strength to 4+/5 to perform ADLS  Baseline:  Goal status: INITIAL  5. Patient to report no pain in R forearm with ADLs.  Baseline:  Goal status: INITIAL  6.  Patient will score >19/24 on the DGI to decrease fall risk.   Baseline: TBD Goal status: INITIAL   PLAN:  PT FREQUENCY: 2x/week  PT DURATION: 8 weeks  PLANNED INTERVENTIONS: 97164- PT Re-evaluation, 97110-Therapeutic exercises, 97530- Therapeutic activity, 97112- Neuromuscular re-education, 97535- Self Care, 16109- Manual therapy, [id number]- Electrical stimulation (unattended), 97016- Vasopneumatic device, 97033- Ionotophoresis 4mg /ml Dexamethasone , Patient/Family education, Balance training, Taping, Dry Needling, Joint mobilization, Spinal mobilization, Cryotherapy, and Moist heat  PLAN FOR NEXT SESSION: do balance screen: TUG and 5XSTS for STG, passive stretch wrist/forearm and elbow, P/AROM per protocol, deltoid isometrics.NO ER past 30 degrees to protect subscapularis repair.  Elbow and wrist ROM  Jinx Mourning, PT 09/26/2509:01 AM

## 2023-09-27 ENCOUNTER — Ambulatory Visit: Payer: Self-pay | Admitting: Physical Therapy

## 2023-09-27 ENCOUNTER — Encounter: Payer: Self-pay | Admitting: Physical Therapy

## 2023-09-27 DIAGNOSIS — R600 Localized edema: Secondary | ICD-10-CM

## 2023-09-27 DIAGNOSIS — R2681 Unsteadiness on feet: Secondary | ICD-10-CM

## 2023-09-27 DIAGNOSIS — M6281 Muscle weakness (generalized): Secondary | ICD-10-CM | POA: Diagnosis not present

## 2023-09-27 DIAGNOSIS — M79601 Pain in right arm: Secondary | ICD-10-CM | POA: Diagnosis not present

## 2023-09-27 DIAGNOSIS — M25611 Stiffness of right shoulder, not elsewhere classified: Secondary | ICD-10-CM

## 2023-10-01 ENCOUNTER — Ambulatory Visit

## 2023-10-01 DIAGNOSIS — R600 Localized edema: Secondary | ICD-10-CM | POA: Diagnosis not present

## 2023-10-01 DIAGNOSIS — M6281 Muscle weakness (generalized): Secondary | ICD-10-CM

## 2023-10-01 DIAGNOSIS — M25611 Stiffness of right shoulder, not elsewhere classified: Secondary | ICD-10-CM

## 2023-10-01 DIAGNOSIS — M79601 Pain in right arm: Secondary | ICD-10-CM

## 2023-10-01 DIAGNOSIS — R2681 Unsteadiness on feet: Secondary | ICD-10-CM | POA: Diagnosis not present

## 2023-10-01 NOTE — Therapy (Signed)
 OUTPATIENT PHYSICAL THERAPY SHOULDER TREATMENT   Patient Name: David [name] MRN: [id number] DOB:07/19/1950, 73 y.o., male Today's Date: 10/01/2023  END OF SESSION:  PT End of Session - 10/01/23 1314     Visit Number 5    Date for PT Re-Evaluation 11/08/23    Authorization Type BC[employer] MCR    Progress Note Due on Visit 10    PT Start Time 1315    PT Stop Time 1358    PT Time Calculation (min) 43 min    Activity Tolerance Patient tolerated treatment well    Behavior During Therapy WFL for tasks assessed/performed            Past Medical History:  Diagnosis Date   Arthritis    Balance problem    Barrett's esophagus    BBB (bundle branch block) 03/15/2016   Carpal tunnel syndrome    DDD (degenerative disc disease), lumbar 03/15/2016   Depression    Dysrhythmia    had some tachycardia with steroids   Elevated cholesterol 03/15/2016   Headache(784.0)    Ocular Migraines - takes Ambien  for it   Heart murmur    HTN (hypertension) 03/15/2016   Hyperlipemia    Hypertension    Malignant melanoma of right side of neck (HCC) 03/15/2016   74 years old   Neuralgia 03/15/2016   Ocular migraine    Osteoarthritis    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    RLS (restless legs syndrome)    Sleep apnea    uses CPAP   Snores    Status post gastric banding    Wears glasses    driving   Past Surgical History:  Procedure Laterality Date   ANTERIOR CERVICAL DECOMPRESSION/DISCECTOMY FUSION 4 LEVELS N/A 02/26/2020   Procedure: ANTERIOR CERVICAL DECOMPRESSION/DISCECTOMY FUSION, INTERBODY PROSTHESIS, PLATE/SCREWS CERVICAL THREE-FOUR CERVICAL FOUR-FIVE CERVICAL FIVE-SIX- CERVICAL SIX-SEVEN;  Surgeon: Garry Kansas, MD;  Location: [facility] OR;  Service: Neurosurgery;  Laterality: N/A;   CARPAL TUNNEL RELEASE Right 09/26/2012   Procedure: CARPAL TUNNEL RELEASE;  Surgeon: Milagros Alf, MD;  Location: [facility];  Service: Orthopedics;  Laterality: Right;   CARPAL TUNNEL RELEASE Left 10/24/2012   Procedure: CARPAL TUNNEL RELEASE;  Surgeon: Milagros Alf, MD;  Location: [facility];  Service: Orthopedics;  Laterality: Left;   CIRCUMCISION     as a baby   EXCISION MELANOMA WITH SENTINEL LYMPH NODE BIOPSY  1984   rt neck with mulpipal nodes and some muscle excised rt neck-shoulder   KNEE ARTHROPLASTY     knee replacement     LAPAROSCOPIC GASTRIC BANDING  03/15/2009   LAPAROSCOPIC GASTRIC BANDING     MUSCLE BIOPSY  2004   rt thigh to r/o myocitis   nerve conduction velosity test     REVERSE SHOULDER ARTHROPLASTY Right 08/21/2023   Procedure: ARTHROPLASTY, SHOULDER, TOTAL, REVERSE;  Surgeon: Jasmine Mesi, MD;  Location: [facility] OR;  Service: Orthopedics;  Laterality: Right;  RIGHT REVERSE SHOULDER ARTHROPLASTY   SHOULDER SURGERY Right 12/2021   TONSILLECTOMY     TOTAL KNEE ARTHROPLASTY Bilateral 12/05/2010   TRIGGER FINGER RELEASE Right 09/26/2012   Procedure: RELEASE TRIGGER FINGER/A-1 PULLEY RING AND SMALL ;  Surgeon: Milagros Alf, MD;  Location: [facility];  Service: Orthopedics;  Laterality: Right;   TRIGGER FINGER RELEASE Left 10/24/2012   Procedure: RELEASE TRIGGER FINGER/A-1 PULLEY LEFT MIDDLE AND LEFT RING;  Surgeon: Milagros Alf, MD;  Location: [facility];  Service: Orthopedics;  Laterality: Left;   UPPER GI ENDOSCOPY     Patient Active Problem List   Diagnosis Date Noted   Rotator cuff arthropathy, right 08/21/2023   S/P reverse total shoulder arthroplasty, right 08/21/2023   Sepsis due to undetermined organism (HCC) 08/20/2022   Cervical spondylosis with radiculopathy 02/26/2020   SBO (small bowel obstruction) (HCC) 08/21/2017   Trochanteric bursitis of left hip 01/18/2017   Lateral epicondylitis, right elbow 01/18/2017   Rheumatoid arthritis (HCC) 03/15/2016   DJD (degenerative joint disease), cervical 03/15/2016   DDD  (degenerative disc disease), lumbar 03/15/2016   HTN (hypertension) 03/15/2016   Obesity 03/15/2016   Elevated cholesterol 03/15/2016   Neuralgia 03/15/2016   Malignant melanoma of right side of neck (HCC) 03/15/2016   BBB (bundle branch block) 03/15/2016   High risk medication use 03/15/2016   Osteoarthritis of lumbar spine 03/15/2016   Primary osteoarthritis of both hands 03/15/2016   Primary osteoarthritis of both knees 03/15/2016   Mesenteric adenitis 06/29/2015    PCP: Stamey, Lowell Rude, FNP   REFERRING PROVIDER: Casilda Clayman, PA-C   REFERRING DIAG: [phone] (ICD-10-CM) - S/P reverse total shoulder arthroplasty, Right (With subscapularis repair)  THERAPY DIAG:  Stiffness of right shoulder, not elsewhere classified  Muscle weakness (generalized)  Localized edema  Pain in right arm  Rationale for Evaluation and Treatment: Rehabilitation  ONSET DATE: 08/21/23 DOS  SUBJECTIVE:                                                                                                                                                                                      SUBJECTIVE STATEMENT: Patient reports minimal pain today; states he has pain in shoulder when reaching behind body and out to the side. Hand dominance: Right  PERTINENT HISTORY: H/o falls,  ACDF, B TKA, HTN, h/o malignant melanoma in R neck affecting R trapezius muscle and R UE ROM  PAIN:  Are you having pain? No  PRECAUTIONS: Shoulder Okay to very lightly lift with the operative extremity but no lifting anything heavier than a coffee cup or cell phone. Start physical therapy to focus on passive range of motion and active range of motion with deltoid isometrics. Do not want to externally rotate past 30 degrees to protect subscapularis repair.   RED FLAGS: None   WEIGHT BEARING RESTRICTIONS: Yes R shoulder  FALLS:  Has patient fallen in last 6 months? Yes. Number of falls 4 tripping over things in cluttered  garage  LIVING ENVIRONMENT: Lives with: lives with their spouse Lives in: House/apartment Stairs: Yes: Internal: 15 steps; on left  going up and bilateral but cannot reach both and External: 3 steps; no railing on deck Has following equipment at home: Single point cane and Crutches  OCCUPATION: retired  PLOF: Independent  PATIENT GOALS:to be able to sleep in bed on side without pain, currently on couch or in bed on back  NEXT MD VISIT: May 14 or 16  OBJECTIVE:  Note: Objective measures were completed at Evaluation unless otherwise noted.  DIAGNOSTIC FINDINGS:  none  PATIENT SURVEYS:  Quick Dash  72.7 / 100 = 72.7 %  COGNITION: Overall cognitive status: Within functional limits for tasks assessed     SENSATION: Mild pull in post distal/lateral  wrist  POSTURE: Rounded shoulders  UPPER EXTREMITY ROM:   A/P ROM Right eval Left eval Left 5/8//25  Shoulder flexion 130/142    Shoulder extension     Shoulder abduction 100 P    Shoulder adduction     Shoulder internal rotation     Shoulder external rotation 30/30    Elbow flexion     Elbow extension -23  -16  Wrist flexion WFL    Wrist extension     Wrist ulnar deviation     Wrist radial deviation     Wrist pronation     Wrist supination     (Blank rows = not tested) R wrist limited since melanoma surgery  UPPER EXTREMITY MMT: NT due to surgery  MMT Right eval Left eval  Shoulder flexion    Shoulder extension    Shoulder abduction    Shoulder adduction    Shoulder internal rotation    Shoulder external rotation    Middle trapezius    Lower trapezius    Elbow flexion    Elbow extension    Wrist flexion    Wrist extension    Wrist ulnar deviation    Wrist radial deviation    Wrist pronation    Wrist supination    Grip strength (lbs)    (Blank rows = not tested)   PALPATION:  TTP in ant and post shoulder, wrist extensors     [facility] Adult PT Treatment:                                                 DATE: 10/01/2023 Therapeutic Exercise: Pulleys shoulder flexion & scaption x 2 min each Elbow extension, supination/pronation PROM (supine) Neuromuscular re-ed: Isometrics: ER, flexion, abd 10x10" each Scap squeeze 8x10" with noodle against back Reactive isometrics ER + RTB, green flex and abd x 10 each Therapeutic Activity: TUG 5xSTS Finger ladder flexion x 10 with scapular retraction and cues to avoid lumbar extension   [facility] Adult PT Treatment:                                                DATE:09/27/23 Therapeutic Exercise/Activity: Pulley flexion x 2 min Pulley scaption/abduction x 2 min Isometrics: ER, flexion, abd all 10 x 10 sec hold Reactive isometrics TB red ER, green flex and abd x 10 each Scap squeeze 2 x 10 Finger ladder flexion x 10 with scapular retraction and cues to avoid lumbar extension Manual Therapy: PROM/stretching R wrist flex/ext, forearm pro/sup and elbow extension    [facility] Adult PT Treatment:  DATE:09/20/23 Therapeutic Exercise/Activity: Pulley flexion x 2 min Pulley scaption/abduction x 2 min Isometrics: extension, flexion, abd all 10 x 10 sec hold Reactive isometrics green TB ext, flex, abd x 10 each Scap squeeze 2 x 10 Finger ladder flexion x 5 Manual Therapy: PROM Rt shoulder flex and abd per protocol IASTM to R upper arm with EDGE tool                                                                                                                           PATIENT EDUCATION: Education details: PT eval findings, anticipated POC, initial HEP, and reviewed precautions and limitations  Person educated: Patient Education method: Explanation, Demonstration, and Handouts Education comprehension: verbalized understanding and returned demonstration  HOME EXERCISE PROGRAM: Access Code: [email or web address] URL: [email or web address] Date: 09/18/2023 Prepared by: Lowery Rue  Exercises - Seated  Shoulder External Rotation AAROM with Cane and Hand in Neutral  - 1-2 x daily - 3 x weekly - 2 sets - 10 reps - Supine Shoulder Flexion Extension AAROM with Dowel  - 2 x daily - 7 x weekly - 1-3 sets - 10 reps - Wrist Flexion Extension AROM with Fingers Curled and Palm Down  - 2 x daily - 7 x weekly - 2 sets - 10 reps - Forearm Pronation and Supination With Arm Supported  - 2 x daily - 7 x weekly - 2 sets - 10 reps - Seated Scapular Retraction  - 1 x daily - 7 x weekly - 3 sets - 10 reps - Isometric Shoulder Abduction at Wall  - 1 x daily - 7 x weekly - 1 sets - 10 reps - 3 seconds hold - Isometric Shoulder Extension at Wall  - 1 x daily - 7 x weekly - 1 sets - 10 reps - 3 seconds hold - Isometric Shoulder Flexion at Wall  - 1 x daily - 7 x weekly - 1 sets - 10 reps - 3 seconds hold  ASSESSMENT:  CLINICAL IMPRESSION:  Elevated scapula compensation demonstrated when lifting hand up on finger ladder; cueing provided slight improvement with body mechanics, however continued difficulty noted with scapula retraction mechanics. Shoulder isometrics continued with focus on postural alignment and awareness; noted tendency to overwork exercises with muscular tension. Balance screening completed per POC and updated STG #2.   OBJECTIVE IMPAIRMENTS: decreased activity tolerance, decreased balance, decreased ROM, decreased strength, increased edema, increased muscle spasms, impaired flexibility, impaired sensation, impaired UE functional use, postural dysfunction, obesity, and pain.    GOALS: Goals reviewed with patient? Yes  SHORT TERM GOALS: Target date: 10/11/2023   Patient will be independent with initial HEP.  Baseline:  Goal status: IN PROGRESS  2. Balance screen performed and appropriate goals set if indicated.  Baseline:  10/01/23: TUG - 8 sec; 5xSTS - 12 sec Goal status: INITIAL    LONG TERM GOALS: Target date: 11/08/2023   Patient will be independent with advanced/ongoing HEP to improve  outcomes and carryover.  Baseline:  Goal status: INITIAL  2.   Decreased Quick Dash by >= 10 points demonstrating improved functional ability.  Baseline: 72.7 Goal status: INITIAL  3.  Patient to improve R shoulder AROM to [facility] without pain provocation to allow for increased ease of ADLs.  Baseline:  Goal status: INITIAL  4.  Patient will demonstrate improved UE strength to 4+/5 to perform ADLS  Baseline:  Goal status: INITIAL  5. Patient to report no pain in R forearm with ADLs.  Baseline:  Goal status: INITIAL  6.  Patient will score >19/24 on the DGI to decrease fall risk.   Baseline: TBD Goal status: INITIAL   PLAN:  PT FREQUENCY: 2x/week  PT DURATION: 8 weeks  PLANNED INTERVENTIONS: 97164- PT Re-evaluation, 97110-Therapeutic exercises, 97530- Therapeutic activity, [id number]- Neuromuscular re-education, 97535- Self Care, 16109- Manual therapy, [id number]- Electrical stimulation (unattended), 97016- Vasopneumatic device, 97033- Ionotophoresis 4mg /ml Dexamethasone , Patient/Family education, Balance training, Taping, Dry Needling, Joint mobilization, Spinal mobilization, Cryotherapy, and Moist heat  PLAN FOR NEXT SESSION: LTG needed for balance. Passive stretch wrist/forearm and elbow, P/AROM per protocol, deltoid isometrics. NO ER past 30 degrees to protect subscapularis repair.  Elbow and wrist ROM  Sims Duck, PTA 10/01/23, 1:59 PM

## 2023-10-02 DIAGNOSIS — C44319 Basal cell carcinoma of skin of other parts of face: Secondary | ICD-10-CM | POA: Diagnosis not present

## 2023-10-03 ENCOUNTER — Ambulatory Visit: Admitting: Physical Therapy

## 2023-10-03 ENCOUNTER — Ambulatory Visit: Admitting: Orthopedic Surgery

## 2023-10-03 ENCOUNTER — Encounter: Payer: Self-pay | Admitting: Physical Therapy

## 2023-10-03 DIAGNOSIS — R2681 Unsteadiness on feet: Secondary | ICD-10-CM

## 2023-10-03 DIAGNOSIS — M6281 Muscle weakness (generalized): Secondary | ICD-10-CM

## 2023-10-03 DIAGNOSIS — M79601 Pain in right arm: Secondary | ICD-10-CM | POA: Diagnosis not present

## 2023-10-03 DIAGNOSIS — R600 Localized edema: Secondary | ICD-10-CM

## 2023-10-03 DIAGNOSIS — M25611 Stiffness of right shoulder, not elsewhere classified: Secondary | ICD-10-CM | POA: Diagnosis not present

## 2023-10-03 DIAGNOSIS — Z96611 Presence of right artificial shoulder joint: Secondary | ICD-10-CM

## 2023-10-03 NOTE — Therapy (Signed)
 OUTPATIENT PHYSICAL THERAPY SHOULDER TREATMENT   Patient Name: David [name] MRN: [id number] DOB:August 06, 1950, 73 y.o., male Today's Date: 10/03/2023  END OF SESSION:  PT End of Session - 10/03/23 1001     Visit Number 6    Date for PT Re-Evaluation 11/08/23    Authorization Type BC[employer] MCR    Authorization - Visit Number 6    Progress Note Due on Visit 10    PT Start Time 0930    PT Stop Time 1000    PT Time Calculation (min) 30 min    Activity Tolerance Patient tolerated treatment well    Behavior During Therapy WFL for tasks assessed/performed             Past Medical History:  Diagnosis Date   Arthritis    Balance problem    Barrett's esophagus    BBB (bundle branch block) 03/15/2016   Carpal tunnel syndrome    DDD (degenerative disc disease), lumbar 03/15/2016   Depression    Dysrhythmia    had some tachycardia with steroids   Elevated cholesterol 03/15/2016   Headache(784.0)    Ocular Migraines - takes Ambien  for it   Heart murmur    HTN (hypertension) 03/15/2016   Hyperlipemia    Hypertension    Malignant melanoma of right side of neck (HCC) 03/15/2016   73 years old   Neuralgia 03/15/2016   Ocular migraine    Osteoarthritis    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    RLS (restless legs syndrome)    Sleep apnea    uses CPAP   Snores    Status post gastric banding    Wears glasses    driving   Past Surgical History:  Procedure Laterality Date   ANTERIOR CERVICAL DECOMPRESSION/DISCECTOMY FUSION 4 LEVELS N/A 02/26/2020   Procedure: ANTERIOR CERVICAL DECOMPRESSION/DISCECTOMY FUSION, INTERBODY PROSTHESIS, PLATE/SCREWS CERVICAL THREE-FOUR CERVICAL FOUR-FIVE CERVICAL FIVE-SIX- CERVICAL SIX-SEVEN;  Surgeon: Garry Kansas, MD;  Location: [facility] OR;  Service: Neurosurgery;  Laterality: N/A;   CARPAL TUNNEL RELEASE Right 09/26/2012   Procedure: CARPAL TUNNEL RELEASE;  Surgeon: Milagros Alf, MD;  Location: [facility];  Service: Orthopedics;  Laterality: Right;   CARPAL TUNNEL RELEASE Left 10/24/2012   Procedure: CARPAL TUNNEL RELEASE;  Surgeon: Milagros Alf, MD;  Location: [facility];  Service: Orthopedics;  Laterality: Left;   CIRCUMCISION     as a baby   EXCISION MELANOMA WITH SENTINEL LYMPH NODE BIOPSY  1984   rt neck with mulpipal nodes and some muscle excised rt neck-shoulder   KNEE ARTHROPLASTY     knee replacement     LAPAROSCOPIC GASTRIC BANDING  03/15/2009   LAPAROSCOPIC GASTRIC BANDING     MUSCLE BIOPSY  2004   rt thigh to r/o myocitis   nerve conduction velosity test     REVERSE SHOULDER ARTHROPLASTY Right 08/21/2023   Procedure: ARTHROPLASTY, SHOULDER, TOTAL, REVERSE;  Surgeon: Jasmine Mesi, MD;  Location: [facility] OR;  Service: Orthopedics;  Laterality: Right;  RIGHT REVERSE SHOULDER ARTHROPLASTY   SHOULDER SURGERY Right 12/2021   TONSILLECTOMY     TOTAL KNEE ARTHROPLASTY Bilateral 12/05/2010   TRIGGER FINGER RELEASE Right 09/26/2012   Procedure: RELEASE TRIGGER FINGER/A-1 PULLEY RING AND SMALL ;  Surgeon: Milagros Alf, MD;  Location: [facility];  Service: Orthopedics;  Laterality: Right;   TRIGGER FINGER RELEASE Left 10/24/2012   Procedure:  RELEASE TRIGGER FINGER/A-1 PULLEY LEFT MIDDLE AND LEFT RING;  Surgeon: Milagros Alf, MD;  Location:  SURGERY CENTER;  Service: Orthopedics;  Laterality: Left;   UPPER GI ENDOSCOPY     Patient Active Problem List   Diagnosis Date Noted   Rotator cuff arthropathy, right 08/21/2023   S/P reverse total shoulder arthroplasty, right 08/21/2023   Sepsis due to undetermined organism (HCC) 08/20/2022   Cervical spondylosis with radiculopathy 02/26/2020   SBO (small bowel obstruction) (HCC) 08/21/2017   Trochanteric bursitis of left hip 01/18/2017   Lateral epicondylitis, right elbow 01/18/2017   Rheumatoid arthritis (HCC) 03/15/2016   DJD (degenerative joint  disease), cervical 03/15/2016   DDD (degenerative disc disease), lumbar 03/15/2016   HTN (hypertension) 03/15/2016   Obesity 03/15/2016   Elevated cholesterol 03/15/2016   Neuralgia 03/15/2016   Malignant melanoma of right side of neck (HCC) 03/15/2016   BBB (bundle branch block) 03/15/2016   High risk medication use 03/15/2016   Osteoarthritis of lumbar spine 03/15/2016   Primary osteoarthritis of both hands 03/15/2016   Primary osteoarthritis of both knees 03/15/2016   Mesenteric adenitis 06/29/2015    PCP: Stamey, Lowell Rude, FNP   REFERRING PROVIDER: Casilda Clayman, PA-C   REFERRING DIAG: [id number] (ICD-10-CM) - S/P reverse total shoulder arthroplasty, Right (With subscapularis repair)  THERAPY DIAG:  Stiffness of right shoulder, not elsewhere classified  Muscle weakness (generalized)  Unsteadiness on feet  Localized edema  Rationale for Evaluation and Treatment: Rehabilitation  ONSET DATE: 08/21/23 DOS  SUBJECTIVE:                                                                                                                                                                                      SUBJECTIVE STATEMENT: Pt had dermatology procedure yesterday and has precautions to not bend, strain, lift or cause exertion until he returns to MD Hand dominance: Right  PERTINENT HISTORY: H/o falls,  ACDF, B TKA, HTN, h/o malignant melanoma in R neck affecting R trapezius muscle and R UE ROM  PAIN:  Are you having pain? No  PRECAUTIONS: Shoulder Okay to very lightly lift with the operative extremity but no lifting anything heavier than a coffee cup or cell phone. Start physical therapy to focus on passive range of motion and active range of motion with deltoid isometrics. Do not want to externally rotate past 30 degrees to protect subscapularis repair.   RED FLAGS: None   WEIGHT BEARING RESTRICTIONS: Yes R shoulder  FALLS:  Has patient fallen in last 6 months? Yes.  Number of falls 4 tripping over things in cluttered garage  LIVING ENVIRONMENT: Lives with: lives with their spouse  Lives in: House/apartment Stairs: Yes: Internal: 15 steps; on left going up and bilateral but cannot reach both and External: 3 steps; no railing on deck Has following equipment at home: Single point cane and Crutches  OCCUPATION: retired  PLOF: Independent  PATIENT GOALS:to be able to sleep in bed on side without pain, currently on couch or in bed on back  NEXT MD VISIT: May 14 or 16  OBJECTIVE:  Note: Objective measures were completed at Evaluation unless otherwise noted.  DIAGNOSTIC FINDINGS:  none  PATIENT SURVEYS:  Quick Dash  72.7 / 100 = 72.7 %  COGNITION: Overall cognitive status: Within functional limits for tasks assessed     SENSATION: Mild pull in post distal/lateral  wrist  POSTURE: Rounded shoulders  UPPER EXTREMITY ROM:   A/P ROM Right eval Left eval Left 5/8//25  Shoulder flexion 130/142    Shoulder extension     Shoulder abduction 100 P    Shoulder adduction     Shoulder internal rotation     Shoulder external rotation 30/30    Elbow flexion     Elbow extension -23  -16  Wrist flexion WFL    Wrist extension     Wrist ulnar deviation     Wrist radial deviation     Wrist pronation     Wrist supination     (Blank rows = not tested) R wrist limited since melanoma surgery  UPPER EXTREMITY MMT: NT due to surgery  MMT Right eval Left eval  Shoulder flexion    Shoulder extension    Shoulder abduction    Shoulder adduction    Shoulder internal rotation    Shoulder external rotation    Middle trapezius    Lower trapezius    Elbow flexion    Elbow extension    Wrist flexion    Wrist extension    Wrist ulnar deviation    Wrist radial deviation    Wrist pronation    Wrist supination    Grip strength (lbs)    (Blank rows = not tested)   PALPATION:  TTP in ant and post shoulder, wrist extensors     [facility] Adult PT  Treatment:                                                DATE: 10/03/23 Therapeutic Exercise/Activity: Elbow flex/ext Pronation/supination Isometrics: ER, flexion, abd all 10 x 10 sec hold Manual Therapy: PROM Rt shoulder per protocol   [facility] Adult PT Treatment:                                                DATE: 10/01/2023 Therapeutic Exercise: Pulleys shoulder flexion & scaption x 2 min each Elbow extension, supination/pronation PROM (supine) Neuromuscular re-ed: Isometrics: ER, flexion, abd 10x10" each Scap squeeze 8x10" with noodle against back Reactive isometrics ER + RTB, green flex and abd x 10 each Therapeutic Activity: TUG 5xSTS Finger ladder flexion x 10 with scapular retraction and cues to avoid lumbar extension   [facility] Adult PT Treatment:  DATE:09/27/23 Therapeutic Exercise/Activity: Pulley flexion x 2 min Pulley scaption/abduction x 2 min Isometrics: ER, flexion, abd all 10 x 10 sec hold Reactive isometrics TB red ER, green flex and abd x 10 each Scap squeeze 2 x 10 Finger ladder flexion x 10 with scapular retraction and cues to avoid lumbar extension Manual Therapy: PROM/stretching R wrist flex/ext, forearm pro/sup and elbow extension                                                                                                                             PATIENT EDUCATION: Education details: PT eval findings, anticipated POC, initial HEP, and reviewed precautions and limitations  Person educated: Patient Education method: Explanation, Demonstration, and Handouts Education comprehension: verbalized understanding and returned demonstration  HOME EXERCISE PROGRAM: Access Code: [email or web address] URL: [email or web address] Date: 09/18/2023 Prepared by: Lowery Rue  Exercises - Seated Shoulder External Rotation AAROM with Cane and Hand in Neutral  - 1-2 x daily - 3 x weekly - 2 sets - 10 reps - Supine  Shoulder Flexion Extension AAROM with Dowel  - 2 x daily - 7 x weekly - 1-3 sets - 10 reps - Wrist Flexion Extension AROM with Fingers Curled and Palm Down  - 2 x daily - 7 x weekly - 2 sets - 10 reps - Forearm Pronation and Supination With Arm Supported  - 2 x daily - 7 x weekly - 2 sets - 10 reps - Seated Scapular Retraction  - 1 x daily - 7 x weekly - 3 sets - 10 reps - Isometric Shoulder Abduction at Wall  - 1 x daily - 7 x weekly - 1 sets - 10 reps - 3 seconds hold - Isometric Shoulder Extension at Wall  - 1 x daily - 7 x weekly - 1 sets - 10 reps - 3 seconds hold - Isometric Shoulder Flexion at Wall  - 1 x daily - 7 x weekly - 1 sets - 10 reps - 3 seconds hold  ASSESSMENT:  CLINICAL IMPRESSION: Treatment modified due to precautions from dermatology procedure. Plan to return to full program as cleared by dermatology   OBJECTIVE IMPAIRMENTS: decreased activity tolerance, decreased balance, decreased ROM, decreased strength, increased edema, increased muscle spasms, impaired flexibility, impaired sensation, impaired UE functional use, postural dysfunction, obesity, and pain.    GOALS: Goals reviewed with patient? Yes  SHORT TERM GOALS: Target date: 10/11/2023   Patient will be independent with initial HEP.  Baseline:  Goal status: IN PROGRESS  2. Balance screen performed and appropriate goals set if indicated.  Baseline:  10/01/23: TUG - 8 sec; 5xSTS - 12 sec Goal status: INITIAL    LONG TERM GOALS: Target date: 11/08/2023   Patient will be independent with advanced/ongoing HEP to improve outcomes and carryover.  Baseline:  Goal status: INITIAL  2.   Decreased Quick Dash by >= 10 points demonstrating improved functional ability.  Baseline: 72.7 Goal status: INITIAL  3.  Patient to  improve R shoulder AROM to [facility] without pain provocation to allow for increased ease of ADLs.  Baseline:  Goal status: INITIAL  4.  Patient will demonstrate improved UE strength to 4+/5 to  perform ADLS  Baseline:  Goal status: INITIAL  5. Patient to report no pain in R forearm with ADLs.  Baseline:  Goal status: INITIAL  6.  Patient will score >19/24 on the DGI to decrease fall risk.   Baseline: TBD Goal status: INITIAL   PLAN:  PT FREQUENCY: 2x/week  PT DURATION: 8 weeks  PLANNED INTERVENTIONS: 97164- PT Re-evaluation, 97110-Therapeutic exercises, 97530- Therapeutic activity, 97112- Neuromuscular re-education, 97535- Self Care, 13244- Manual therapy, G0283- Electrical stimulation (unattended), 97016- Vasopneumatic device, 97033- Ionotophoresis 4mg /ml Dexamethasone , Patient/Family education, Balance training, Taping, Dry Needling, Joint mobilization, Spinal mobilization, Cryotherapy, and Moist heat  PLAN FOR NEXT SESSION: Passive stretch wrist/forearm and elbow, P/AROM per protocol, deltoid isometrics. NO ER past 30 degrees to protect subscapularis repair.  Elbow and wrist ROM  Lowery Rue, PT,DPT05/14/2510:02 AM

## 2023-10-04 ENCOUNTER — Encounter: Admitting: Surgical

## 2023-10-04 ENCOUNTER — Other Ambulatory Visit: Payer: Self-pay | Admitting: Surgical

## 2023-10-04 NOTE — Discharge Summary (Signed)
 Physician Discharge Summary      Patient ID: David [name] MRN: [id number] DOB/AGE: March 30, 1951 73 y.o.  Admit date: 08/21/2023 Discharge date: 08/22/2023  Admission Diagnoses:  Principal Problem:   Rotator cuff arthropathy, right Active Problems:   S/P reverse total shoulder arthroplasty, right   Discharge Diagnoses:  Same  Surgeries: Procedure(s): ARTHROPLASTY, SHOULDER, TOTAL, REVERSE on 08/21/2023   Consultants:   Discharged Condition: Stable  Hospital Course: David [name] is an 73 y.o. male who was admitted 08/21/2023 with a chief complaint of right shoulder pain, and found to have a diagnosis of right shoulder rotator cuff arthropathy.  They were brought to the operating room on 08/21/2023 and underwent the above named procedures.  Pt awoke from anesthesia without complication and was transferred to the floor. On POD1, patient's pain was overall controlled.  Ambulated well without difficulty.  No red flag signs or symptoms throughout his stay.  Discharged home on POD 1..  Pt will f/u with Dr. [name] in clinic in ~2 weeks.   Antibiotics given:  Anti-infectives (From admission, onward)    Start     Dose/Rate Route Frequency Ordered Stop   08/21/23 1530  ceFAZolin  (ANCEF ) IVPB 2g/100 mL premix        2 g 200 mL/hr over 30 Minutes Intravenous Every 8 hours 08/21/23 1234 08/22/23 0914   08/21/23 1130  hydroxychloroquine  (PLAQUENIL ) tablet 200 mg  Status:  Discontinued        200 mg Oral 2 times daily 08/21/23 1123 08/22/23 1932   08/21/23 0814  vancomycin  (VANCOCIN ) powder  Status:  Discontinued          As needed 08/21/23 0814 08/21/23 1119   08/21/23 0600  ceFAZolin  (ANCEF ) IVPB 2g/100 mL premix        2 g 200 mL/hr over 30 Minutes Intravenous On call to O.R. 08/21/23 0555 08/21/23 0800     .  Recent vital signs:  Vitals:   08/22/23 0954 08/22/23 0957  BP: 124/67 124/67  Pulse: 76 76  Resp:    Temp:    SpO2:  92%    Recent laboratory studies:  Results for orders  placed or performed during the hospital encounter of 08/10/23  Urinalysis, w/ Reflex to Culture (Infection Suspected) -Urine, Clean Catch   Collection Time: 08/10/23 10:32 AM  Result Value Ref Range   Specimen Source URINE, CLEAN CATCH    Color, Urine YELLOW YELLOW   APPearance CLEAR CLEAR   Specific Gravity, Urine >1.030 (H) 1.005 - 1.030   pH 6.0 5.0 - 8.0   Glucose, UA 100 (A) NEGATIVE mg/dL   Hgb urine dipstick NEGATIVE NEGATIVE   Bilirubin Urine NEGATIVE NEGATIVE   Ketones, ur NEGATIVE NEGATIVE mg/dL   Protein, ur NEGATIVE NEGATIVE mg/dL   Nitrite NEGATIVE NEGATIVE   Leukocytes,Ua NEGATIVE NEGATIVE   Squamous Epithelial / HPF 0-5 0 - 5 /HPF   WBC, UA 0-5 0 - 5 WBC/hpf   RBC / HPF NONE SEEN 0 - 5 RBC/hpf   Bacteria, UA RARE (A) NONE SEEN  Surgical pcr screen   Collection Time: 08/10/23 10:33 AM   Specimen: Nasal Mucosa; Nasal Swab  Result Value Ref Range   MRSA, PCR NEGATIVE NEGATIVE   Staphylococcus aureus POSITIVE (A) NEGATIVE  CBC   Collection Time: 08/10/23 11:00 AM  Result Value Ref Range   WBC 8.4 4.0 - 10.5 K/uL   RBC 4.62 4.22 - 5.81 MIL/uL   Hemoglobin 14.5 13.0 - 17.0 g/dL   HCT 44.3  39.0 - 52.0 %   MCV 95.9 80.0 - 100.0 fL   MCH 31.4 26.0 - 34.0 pg   MCHC 32.7 30.0 - 36.0 g/dL   RDW 52.8 41.3 - 24.4 %   Platelets 250 150 - 400 K/uL   nRBC 0.0 0.0 - 0.2 %  Basic metabolic panel   Collection Time: 08/10/23 11:00 AM  Result Value Ref Range   Sodium 140 135 - 145 mmol/L   Potassium 4.9 3.5 - 5.1 mmol/L   Chloride 102 98 - 111 mmol/L   CO2 30 22 - 32 mmol/L   Glucose, Bld 107 (H) 70 - 99 mg/dL   BUN 16 8 - 23 mg/dL   Creatinine, Ser 0.10 0.61 - 1.24 mg/dL   Calcium 27.2 8.9 - 53.6 mg/dL   GFR, Estimated >64 >40 mL/min   Anion gap 8 5 - 15    Discharge Medications:   Allergies as of 08/22/2023       Reactions   Codeine Itching   Dilaudid  [hydromorphone  Hcl] Itching   Fentanyl  Itching   Pt states Fentanyl  patch causes itching   Nucynta [tapentadol]  Other (See Comments)   MENTAL PROBLEMS   Penicillins Itching   Has patient had a PCN reaction causing immediate rash, facial/tongue/throat swelling, SOB or lightheadedness with hypotension:unsure childhood reaction Has patient had a PCN reaction causing severe rash involving mucus membranes or skin necrosis:unsure Has patient had a PCN reaction that required hospitalization:No Has patient had a PCN reaction occurring within the last 10 years:NO If all of the above answers are "NO", then may proceed with Cephalosporin use.        Medication List     STOP taking these medications    HYDROcodone -acetaminophen  5-325 MG tablet Commonly known as: Norco   hydroxychloroquine  200 MG tablet Commonly known as: PLAQUENIL    ibuprofen 200 MG tablet Commonly known as: ADVIL   methotrexate  50 MG/2ML injection   sulfaSALAzine  500 MG tablet Commonly known as: AZULFIDINE        TAKE these medications    aspirin  EC 81 MG tablet Take 1 tablet (81 mg total) by mouth daily. Swallow whole.   docusate sodium  100 MG capsule Commonly known as: COLACE Take 1 capsule (100 mg total) by mouth 2 (two) times daily.   DULoxetine  60 MG capsule Commonly known as: CYMBALTA  Take 60 mg by mouth 2 (two) times daily.   folic acid  1 MG tablet Commonly known as: FOLVITE  TAKE 2 TABLETS BY MOUTH DAILY   gabapentin  300 MG capsule Commonly known as: NEURONTIN  TAKE THREE CAPSULES BY MOUTH THREE TIMES A DAY   gabapentin  600 MG tablet Commonly known as: NEURONTIN  Take 600-1,200 mg by mouth See admin instructions. Take 600 mg by mouth in the morning and take 1200 mg at night   hydrochlorothiazide  25 MG tablet Commonly known as: HYDRODIURIL  Take 25 mg by mouth daily.   IVIZIA DRY EYES OP Place 1 drop into both eyes as needed (dry eyes).   methocarbamol  750 MG tablet Commonly known as: ROBAXIN  TAKE ONE TABLET BY MOUTH EVERY 8 HOURS AS NEEDED FOR MUSCLE SPASMS   metoprolol  succinate 25 MG 24 hr  tablet Commonly known as: TOPROL -XL Take 25 mg by mouth daily.   NON FORMULARY Pt uses a cpap nightly   oxyCODONE  5 MG immediate release tablet Commonly known as: Oxy IR/ROXICODONE  Take 1 tablet (5 mg total) by mouth every 4 (four) hours as needed for moderate pain (pain score 4-6) (pain score 4-6).  PRESERVISION AREDS 2 PO Take 1 capsule by mouth in the morning and at bedtime.   rOPINIRole  4 MG tablet Commonly known as: REQUIP  Take 4 mg by mouth at bedtime.   simvastatin  40 MG tablet Commonly known as: ZOCOR  Take 40 mg by mouth at bedtime.   tamsulosin  0.4 MG Caps capsule Commonly known as: FLOMAX  Take 0.4 mg by mouth daily.   traZODone  50 MG tablet Commonly known as: DESYREL  Take 50 mg by mouth at bedtime.   Tuberculin-Allergy  Syringes 27G X 1/2" 1 ML Misc Use to inject methotrexate  once weekly.   BD Allergist Tray 27G X 1/2" 1 ML Kit Generic drug: Tuberculin-Allergy  Syringes USE TO INJECT METHOTREXATE  ONCE WEEKLY        Diagnostic Studies: XR Shoulder Right Result Date: 09/09/2023 AP, scapular Y, axillary views of the shoulder reviewed.  Reverse shoulder arthroplasty prosthesis in good position and alignment without any complicating features.  There is no evidence of periprosthetic fracture, dislocation, dissociation of the glenosphere.    Disposition: Discharge disposition: 01-Home or Self Care       Discharge Instructions     Call MD / Call 911   Complete by: As directed    If you experience chest pain or shortness of breath, CALL 911 and be transported to the hospital emergency room.  If you develope a fever above 101 F, pus (white drainage) or increased drainage or redness at the wound, or calf pain, call your surgeon's office.   Constipation Prevention   Complete by: As directed    Drink plenty of fluids.  Prune juice may be helpful.  You may use a stool softener, such as Colace (over the counter) 100 mg twice a day.  Use MiraLax (over the counter)  for constipation as needed.   Diet - low sodium heart healthy   Complete by: As directed    Discharge instructions   Complete by: As directed    You may shower, dressing is waterproof.  Do not bathe or soak the operative shoulder in a tub, pool.  Use the CPM machine 3 times a day for one hour each time.  No lifting with the operative shoulder. Continue use of the sling.  Follow-up with Dr. [name] or Hurshel Maidens in ~2 weeks on your given appointment date.  We will remove your adhesive bandage at that time.    Dental Antibiotics:  In most cases prophylactic antibiotics for Dental procdeures after total joint surgery are not necessary.  Exceptions are as follows:  1. History of prior total joint infection  2. Severely immunocompromised (Organ Transplant, cancer chemotherapy, Rheumatoid biologic meds such as Humera)  3. Poorly controlled diabetes (A1C &gt; 8.0, blood glucose over 200)  If you have one of these conditions, contact your surgeon for an antibiotic prescription, prior to your dental procedure.   Increase activity slowly as tolerated   Complete by: As directed    Post-operative opioid taper instructions:   Complete by: As directed    POST-OPERATIVE OPIOID TAPER INSTRUCTIONS: It is important to wean off of your opioid medication as soon as possible. If you do not need pain medication after your surgery it is ok to stop day one. Opioids include: Codeine, Hydrocodone (Norco, Vicodin), Oxycodone (Percocet, oxycontin ) and hydromorphone  amongst others.  Long term and even short term use of opiods can cause: Increased pain response Dependence Constipation Depression Respiratory depression And more.  Withdrawal symptoms can include Flu like symptoms Nausea, vomiting And more Techniques to manage these symptoms Hydrate well  Eat regular healthy meals Stay active Use relaxation techniques(deep breathing, meditating, yoga) Do Not substitute Alcohol to help with tapering If you  have been on opioids for less than two weeks and do not have pain than it is ok to stop all together.  Plan to wean off of opioids This plan should start within one week post op of your joint replacement. Maintain the same interval or time between taking each dose and first decrease the dose.  Cut the total daily intake of opioids by one tablet each day Next start to increase the time between doses. The last dose that should be eliminated is the evening dose.           Follow-up Information     Stamey, Lowell Rude, FNP Follow up.   Specialty: Family Medicine Contact information: [address] [place] [address] [phone]                  Signed: Prentis Brock 10/04/2023, 1:14 PM

## 2023-10-07 ENCOUNTER — Encounter: Payer: Self-pay | Admitting: Orthopedic Surgery

## 2023-10-07 NOTE — Progress Notes (Signed)
 Post-Op Visit Note   Patient: David [name]           Date of Birth: 1950-12-12           MRN: [id number] Visit Date: 10/03/2023 PCP: Lorenza Romans, FNP   Assessment & Plan:  Chief Complaint:  Chief Complaint  Patient presents with   Right Shoulder - Routine Post Op    08/21/23 R RSA   Visit Diagnoses:  1. S/P reverse total shoulder arthroplasty, right     Plan: [place] is now 6 weeks out from right reverse shoulder replacement performed 08/21/2023.  He was having some radicular pain which is subsided since the last office visit.  Wakes him from sleep at night only.  Rolls wrong way.  On exam he has excellent range of motion above 90 degrees of forward flexion and abduction.  Deltoid is functional.  Incision is intact.  He is doing therapy 2 times a week with home exercise program.  Plan at this time is 6-week return for final recheck.  I think he is okay to start lifting up to but not beyond 20 pounds.  Follow-Up Instructions: No follow-ups on file.   Orders:  No orders of the defined types were placed in this encounter.  No orders of the defined types were placed in this encounter.   Imaging: No results found.  PMFS History: Patient Active Problem List   Diagnosis Date Noted   Rotator cuff arthropathy, right 08/21/2023   S/P reverse total shoulder arthroplasty, right 08/21/2023   Sepsis due to undetermined organism (HCC) 08/20/2022   Cervical spondylosis with radiculopathy 02/26/2020   SBO (small bowel obstruction) (HCC) 08/21/2017   Trochanteric bursitis of left hip 01/18/2017   Lateral epicondylitis, right elbow 01/18/2017   Rheumatoid arthritis (HCC) 03/15/2016   DJD (degenerative joint disease), cervical 03/15/2016   DDD (degenerative disc disease), lumbar 03/15/2016   HTN (hypertension) 03/15/2016   Obesity 03/15/2016   Elevated cholesterol 03/15/2016   Neuralgia 03/15/2016   Malignant melanoma of right side of neck (HCC) 03/15/2016   BBB (bundle branch  block) 03/15/2016   High risk medication use 03/15/2016   Osteoarthritis of lumbar spine 03/15/2016   Primary osteoarthritis of both hands 03/15/2016   Primary osteoarthritis of both knees 03/15/2016   Mesenteric adenitis 06/29/2015   Past Medical History:  Diagnosis Date   Arthritis    Balance problem    Barrett's esophagus    BBB (bundle branch block) 03/15/2016   Carpal tunnel syndrome    DDD (degenerative disc disease), lumbar 03/15/2016   Depression    Dysrhythmia    had some tachycardia with steroids   Elevated cholesterol 03/15/2016   Headache(784.0)    Ocular Migraines - takes Ambien  for it   Heart murmur    HTN (hypertension) 03/15/2016   Hyperlipemia    Hypertension    Malignant melanoma of right side of neck (HCC) 03/15/2016   73 years old   Neuralgia 03/15/2016   Ocular migraine    Osteoarthritis    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    RLS (restless legs syndrome)    Sleep apnea    uses CPAP   Snores    Status post gastric banding    Wears glasses    driving    Family History  Problem Relation Age of Onset   Heart disease Mother    Alzheimer's disease Father  Past Surgical History:  Procedure Laterality Date   ANTERIOR CERVICAL DECOMPRESSION/DISCECTOMY FUSION 4 LEVELS N/A 02/26/2020   Procedure: ANTERIOR CERVICAL DECOMPRESSION/DISCECTOMY FUSION, INTERBODY PROSTHESIS, PLATE/SCREWS CERVICAL THREE-FOUR CERVICAL FOUR-FIVE CERVICAL FIVE-SIX- CERVICAL SIX-SEVEN;  Surgeon: Garry Kansas, MD;  Location: [facility] OR;  Service: Neurosurgery;  Laterality: N/A;   CARPAL TUNNEL RELEASE Right 09/26/2012   Procedure: CARPAL TUNNEL RELEASE;  Surgeon: Milagros Alf, MD;  Location: [facility];  Service: Orthopedics;  Laterality: Right;   CARPAL TUNNEL RELEASE Left 10/24/2012   Procedure: CARPAL TUNNEL RELEASE;  Surgeon: Milagros Alf, MD;  Location: [facility];   Service: Orthopedics;  Laterality: Left;   CIRCUMCISION     as a baby   EXCISION MELANOMA WITH SENTINEL LYMPH NODE BIOPSY  1984   rt neck with mulpipal nodes and some muscle excised rt neck-shoulder   KNEE ARTHROPLASTY     knee replacement     LAPAROSCOPIC GASTRIC BANDING  03/15/2009   LAPAROSCOPIC GASTRIC BANDING     MUSCLE BIOPSY  2004   rt thigh to r/o myocitis   nerve conduction velosity test     REVERSE SHOULDER ARTHROPLASTY Right 08/21/2023   Procedure: ARTHROPLASTY, SHOULDER, TOTAL, REVERSE;  Surgeon: Jasmine Mesi, MD;  Location: [facility] OR;  Service: Orthopedics;  Laterality: Right;  RIGHT REVERSE SHOULDER ARTHROPLASTY   SHOULDER SURGERY Right 12/2021   TONSILLECTOMY     TOTAL KNEE ARTHROPLASTY Bilateral 12/05/2010   TRIGGER FINGER RELEASE Right 09/26/2012   Procedure: RELEASE TRIGGER FINGER/A-1 PULLEY RING AND SMALL ;  Surgeon: Milagros Alf, MD;  Location: [facility];  Service: Orthopedics;  Laterality: Right;   TRIGGER FINGER RELEASE Left 10/24/2012   Procedure: RELEASE TRIGGER FINGER/A-1 PULLEY LEFT MIDDLE AND LEFT RING;  Surgeon: Milagros Alf, MD;  Location:  SURGERY CENTER;  Service: Orthopedics;  Laterality: Left;   UPPER GI ENDOSCOPY     Social History   Occupational History   Not on file  Tobacco Use   Smoking status: Former    Current packs/day: 0.00    Average packs/day: 1 pack/day for 30.0 years (30.0 ttl pk-yrs)    Types: Cigarettes    Start date: 09/24/1971    Quit date: 09/23/2001    Years since quitting: 22.0    Passive exposure: Never   Smokeless tobacco: Never  Vaping Use   Vaping status: Never Used  Substance and Sexual Activity   Alcohol use: Yes    Comment: maybe one drink once a month   Drug use: No   Sexual activity: Not on file

## 2023-10-09 ENCOUNTER — Encounter: Payer: Self-pay | Admitting: Physical Therapy

## 2023-10-09 ENCOUNTER — Ambulatory Visit: Admitting: Physical Therapy

## 2023-10-09 DIAGNOSIS — R2681 Unsteadiness on feet: Secondary | ICD-10-CM | POA: Diagnosis not present

## 2023-10-09 DIAGNOSIS — R600 Localized edema: Secondary | ICD-10-CM | POA: Diagnosis not present

## 2023-10-09 DIAGNOSIS — M6281 Muscle weakness (generalized): Secondary | ICD-10-CM

## 2023-10-09 DIAGNOSIS — M79601 Pain in right arm: Secondary | ICD-10-CM | POA: Diagnosis not present

## 2023-10-09 DIAGNOSIS — M25611 Stiffness of right shoulder, not elsewhere classified: Secondary | ICD-10-CM

## 2023-10-09 NOTE — Therapy (Signed)
 OUTPATIENT PHYSICAL THERAPY SHOULDER TREATMENT   Patient Name: David [name] MRN: [id number] DOB:1951-04-26, 73 y.o., male Today's Date: 10/09/2023  END OF SESSION:  PT End of Session - 10/09/23 1450     Visit Number 7    Date for PT Re-Evaluation 11/08/23    Authorization Type [employer] MCR    Authorization - Visit Number 7    Progress Note Due on Visit 10    PT Start Time 1449    PT Stop Time 1531    PT Time Calculation (min) 42 min    Activity Tolerance Patient tolerated treatment well    Behavior During Therapy WFL for tasks assessed/performed              Past Medical History:  Diagnosis Date   Arthritis    Balance problem    Barrett's esophagus    BBB (bundle branch block) 03/15/2016   Carpal tunnel syndrome    DDD (degenerative disc disease), lumbar 03/15/2016   Depression    Dysrhythmia    had some tachycardia with steroids   Elevated cholesterol 03/15/2016   Headache(784.0)    Ocular Migraines - takes Ambien  for it   Heart murmur    HTN (hypertension) 03/15/2016   Hyperlipemia    Hypertension    Malignant melanoma of right side of neck (HCC) 03/15/2016   73 years old   Neuralgia 03/15/2016   Ocular migraine    Osteoarthritis    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    RLS (restless legs syndrome)    Sleep apnea    uses CPAP   Snores    Status post gastric banding    Wears glasses    driving   Past Surgical History:  Procedure Laterality Date   ANTERIOR CERVICAL DECOMPRESSION/DISCECTOMY FUSION 4 LEVELS N/A 02/26/2020   Procedure: ANTERIOR CERVICAL DECOMPRESSION/DISCECTOMY FUSION, INTERBODY PROSTHESIS, PLATE/SCREWS CERVICAL THREE-FOUR CERVICAL FOUR-FIVE CERVICAL FIVE-SIX- CERVICAL SIX-SEVEN;  Surgeon: Garry Kansas, MD;  Location: [facility] OR;  Service: Neurosurgery;  Laterality: N/A;   CARPAL TUNNEL RELEASE Right 09/26/2012   Procedure: CARPAL TUNNEL RELEASE;  Surgeon: Milagros Alf, MD;  Location: [facility];  Service: Orthopedics;  Laterality: Right;   CARPAL TUNNEL RELEASE Left 10/24/2012   Procedure: CARPAL TUNNEL RELEASE;  Surgeon: Milagros Alf, MD;  Location: [facility];  Service: Orthopedics;  Laterality: Left;   CIRCUMCISION     as a baby   EXCISION MELANOMA WITH SENTINEL LYMPH NODE BIOPSY  1984   rt neck with mulpipal nodes and some muscle excised rt neck-shoulder   KNEE ARTHROPLASTY     knee replacement     LAPAROSCOPIC GASTRIC BANDING  03/15/2009   LAPAROSCOPIC GASTRIC BANDING     MUSCLE BIOPSY  2004   rt thigh to r/o myocitis   nerve conduction velosity test     REVERSE SHOULDER ARTHROPLASTY Right 08/21/2023   Procedure: ARTHROPLASTY, SHOULDER, TOTAL, REVERSE;  Surgeon: Jasmine Mesi, MD;  Location: [facility] OR;  Service: Orthopedics;  Laterality: Right;  RIGHT REVERSE SHOULDER ARTHROPLASTY   SHOULDER SURGERY Right 12/2021   TONSILLECTOMY     TOTAL KNEE ARTHROPLASTY Bilateral 12/05/2010   TRIGGER FINGER RELEASE Right 09/26/2012   Procedure: RELEASE TRIGGER FINGER/A-1 PULLEY RING AND SMALL ;  Surgeon: Milagros Alf, MD;  Location: [facility];  Service: Orthopedics;  Laterality: Right;   TRIGGER FINGER RELEASE Left 10/24/2012  Procedure: RELEASE TRIGGER FINGER/A-1 PULLEY LEFT MIDDLE AND LEFT RING;  Surgeon: Milagros Alf, MD;  Location: [facility];  Service: Orthopedics;  Laterality: Left;   UPPER GI ENDOSCOPY     Patient Active Problem List   Diagnosis Date Noted   Rotator cuff arthropathy, right 08/21/2023   S/P reverse total shoulder arthroplasty, right 08/21/2023   Sepsis due to undetermined organism (HCC) 08/20/2022   Cervical spondylosis with radiculopathy 02/26/2020   SBO (small bowel obstruction) (HCC) 08/21/2017   Trochanteric bursitis of left hip 01/18/2017   Lateral epicondylitis, right elbow 01/18/2017   Rheumatoid arthritis (HCC) 03/15/2016   DJD (degenerative joint  disease), cervical 03/15/2016   DDD (degenerative disc disease), lumbar 03/15/2016   HTN (hypertension) 03/15/2016   Obesity 03/15/2016   Elevated cholesterol 03/15/2016   Neuralgia 03/15/2016   Malignant melanoma of right side of neck (HCC) 03/15/2016   BBB (bundle branch block) 03/15/2016   High risk medication use 03/15/2016   Osteoarthritis of lumbar spine 03/15/2016   Primary osteoarthritis of both hands 03/15/2016   Primary osteoarthritis of both knees 03/15/2016   Mesenteric adenitis 06/29/2015    PCP: Stamey, Lowell Rude, FNP   REFERRING PROVIDER: Casilda Clayman, PA-C   REFERRING DIAG: [phone] (ICD-10-CM) - S/P reverse total shoulder arthroplasty, Right (With subscapularis repair)  THERAPY DIAG:  Stiffness of right shoulder, not elsewhere classified  Muscle weakness (generalized)  Unsteadiness on feet  Rationale for Evaluation and Treatment: Rehabilitation  ONSET DATE: 08/21/23 DOS  SUBJECTIVE:                                                                                                                                                                                      SUBJECTIVE STATEMENT: Saw MD. Able to lift up to but not beyond 20#. My shoulder is sore today. I've been pushing it a little. Hand dominance: Right  PERTINENT HISTORY: H/o falls,  ACDF, B TKA, HTN, h/o malignant melanoma in R neck affecting R trapezius muscle and R UE ROM  PAIN:  Are you having pain? No  PRECAUTIONS: Shoulder Okay to very lightly lift with the operative extremity but no lifting anything heavier than a coffee cup or cell phone. Start physical therapy to focus on passive range of motion and active range of motion with deltoid isometrics. Do not want to externally rotate past 30 degrees to protect subscapularis repair.   RED FLAGS: None   WEIGHT BEARING RESTRICTIONS: Yes R shoulder  FALLS:  Has patient fallen in last 6 months? Yes. Number of falls 4 tripping over things  in cluttered garage  LIVING ENVIRONMENT: Lives with: lives with their spouse Lives  in: House/apartment Stairs: Yes: Internal: 15 steps; on left going up and bilateral but cannot reach both and External: 3 steps; no railing on deck Has following equipment at home: Single point cane and Crutches  OCCUPATION: retired  PLOF: Independent  PATIENT GOALS:to be able to sleep in bed on side without pain, currently on couch or in bed on back  NEXT MD VISIT: May 14 or 16  OBJECTIVE:  Note: Objective measures were completed at Evaluation unless otherwise noted.  DIAGNOSTIC FINDINGS:  none  PATIENT SURVEYS:  Quick Dash  72.7 / 100 = 72.7 %  COGNITION: Overall cognitive status: Within functional limits for tasks assessed     SENSATION: Mild pull in post distal/lateral  wrist  POSTURE: Rounded shoulders  UPPER EXTREMITY ROM:   A/P ROM Right eval Left eval Left 5/8//25  Shoulder flexion 130/142    Shoulder extension     Shoulder abduction 100 P    Shoulder adduction     Shoulder internal rotation     Shoulder external rotation 30/30    Elbow flexion     Elbow extension -23  -16  Wrist flexion WFL    Wrist extension     Wrist ulnar deviation     Wrist radial deviation     Wrist pronation     Wrist supination     (Blank rows = not tested) R wrist limited since melanoma surgery  UPPER EXTREMITY MMT: NT due to surgery  MMT Right eval Left eval  Shoulder flexion    Shoulder extension    Shoulder abduction    Shoulder adduction    Shoulder internal rotation    Shoulder external rotation    Middle trapezius    Lower trapezius    Elbow flexion    Elbow extension    Wrist flexion    Wrist extension    Wrist ulnar deviation    Wrist radial deviation    Wrist pronation    Wrist supination    Grip strength (lbs)    (Blank rows = not tested)   PALPATION:  TTP in ant and post shoulder, wrist extensors    [facility] Adult PT Treatment:                                                 DATE: 10/09/2023 Therapeutic Exercise: Pulleys shoulder flexion & scaption x 2 min each Elbow extension stretch in supine with 3# x 1 min then 5# x 1 min (passive stretch to -12 degrees) Seated biceps curl 5# 2 x 10 Neuromuscular re-ed: Prone T x 10 Prone ext x 3 -  held due to popping in shoulder; also attempted rows but popping persisted Supine circles CW/CCW with 3# wt  (90 deg flexion) Supine Alphabet with 3# wt (90 deg flexion) Reactive isometrics ER + RTB, green ext 10 each Manual Therapy: PROM R elbow and supination/pronation  (supine)    [facility] Adult PT Treatment:                                                DATE: 10/03/23 Therapeutic Exercise/Activity: Elbow flex/ext Pronation/supination Isometrics: ER, flexion, abd all 10 x 10 sec hold Manual Therapy: PROM Rt shoulder per protocol  [facility] Adult PT Treatment:                                                DATE: 10/01/2023 Therapeutic Exercise: Pulleys shoulder flexion & scaption x 2 min each Elbow extension, supination/pronation PROM (supine) Neuromuscular re-ed: Isometrics: ER, flexion, abd 10x10" each Scap squeeze 8x10" with noodle against back Reactive isometrics ER + RTB, green flex and abd x 10 each Therapeutic Activity: TUG 5xSTS Finger ladder flexion x 10 with scapular retraction and cues to avoid lumbar extension   [facility] Adult PT Treatment:                                                DATE:09/27/23 Therapeutic Exercise/Activity: Pulley flexion x 2 min Pulley scaption/abduction x 2 min Isometrics: ER, flexion, abd all 10 x 10 sec hold Reactive isometrics TB red ER, green flex and abd x 10 each Scap squeeze 2 x 10 Finger ladder flexion x 10 with scapular retraction and cues to avoid lumbar extension Manual Therapy: PROM/stretching R wrist flex/ext, forearm pro/sup and elbow extension                                                                                                                              PATIENT EDUCATION: Education details: PT eval findings, anticipated POC, initial HEP, and reviewed precautions and limitations  Person educated: Patient Education method: Explanation, Demonstration, and Handouts Education comprehension: verbalized understanding and returned demonstration  HOME EXERCISE PROGRAM: Access Code: [email or web address] URL: [email or web address] Date: 09/18/2023 Prepared by: Lowery Rue  Exercises - Seated Shoulder External Rotation AAROM with Cane and Hand in Neutral  - 1-2 x daily - 3 x weekly - 2 sets - 10 reps - Supine Shoulder Flexion Extension AAROM with Dowel  - 2 x daily - 7 x weekly - 1-3 sets - 10 reps - Wrist Flexion Extension AROM with Fingers Curled and Palm Down  - 2 x daily - 7 x weekly - 2 sets - 10 reps - Forearm Pronation and Supination With Arm Supported  - 2 x daily - 7 x weekly - 2 sets - 10 reps - Seated Scapular Retraction  - 1 x daily - 7 x weekly - 3 sets - 10 reps - Isometric Shoulder Abduction at Wall  - 1 x daily - 7 x weekly - 1 sets - 10 reps - 3 seconds hold - Isometric Shoulder Extension at Wall  - 1 x daily - 7 x weekly - 1 sets - 10 reps - 3 seconds hold - Isometric Shoulder Flexion at Wall  - 1 x daily - 7 x weekly - 1 sets - 10 reps - 3 seconds hold  ASSESSMENT:  CLINICAL IMPRESSION:  Patient is 7 weeks post op. He saw MD and can lift up to 20# per pt and MD note. He reports his shoulder is more sore today because he has been "pushing it". He indicated ER when asked what he was pushing. He also stated that he  can now toilet with his R hand/arm Precautions were reviewed with patient again today to limit resisted extension and IR of the shoulder. We attempted some prone exercises but stopped extension due to popping in shoulder.    OBJECTIVE IMPAIRMENTS: decreased activity tolerance, decreased balance, decreased ROM, decreased strength, increased edema, increased muscle spasms, impaired flexibility, impaired  sensation, impaired UE functional use, postural dysfunction, obesity, and pain.    GOALS: Goals reviewed with patient? Yes  SHORT TERM GOALS: Target date: 10/11/2023   Patient will be independent with initial HEP.  Baseline:  Goal status: IN PROGRESS  2. Balance screen performed and appropriate goals set if indicated.  Baseline:  10/01/23: TUG - 8 sec; 5xSTS - 12 sec Goal status: INITIAL    LONG TERM GOALS: Target date: 11/08/2023   Patient will be independent with advanced/ongoing HEP to improve outcomes and carryover.  Baseline:  Goal status: INITIAL  2.   Decreased Quick Dash by >= 10 points demonstrating improved functional ability.  Baseline: 72.7 Goal status: INITIAL  3.  Patient to improve R shoulder AROM to [facility] without pain provocation to allow for increased ease of ADLs.  Baseline:  Goal status: INITIAL  4.  Patient will demonstrate improved UE strength to 4+/5 to perform ADLS  Baseline:  Goal status: INITIAL  5. Patient to report no pain in R forearm with ADLs.  Baseline:  Goal status: INITIAL  6.  Patient will score >19/24 on the DGI to decrease fall risk.   Baseline: TBD Goal status: INITIAL   PLAN:  PT FREQUENCY: 2x/week  PT DURATION: 8 weeks  PLANNED INTERVENTIONS: 97164- PT Re-evaluation, 97110-Therapeutic exercises, 97530- Therapeutic activity, 97112- Neuromuscular re-education, 97535- Self Care, 16109- Manual therapy, [id number]- Electrical stimulation (unattended), 97016- Vasopneumatic device, 97033- Ionotophoresis 4mg /ml Dexamethasone , Patient/Family education, Balance training, Taping, Dry Needling, Joint mobilization, Spinal mobilization, Cryotherapy, and Moist heat  PLAN FOR NEXT SESSION: Passive stretch wrist/forearm and elbow, progress scapular strength per protocol. P/AROM per protocol, deltoid isometrics. NO ER past 30 degrees to protect subscapularis repair.  Elbow and wrist ROM. Pt able to lift up to 20# per MD note.   Jinx Mourning, PT  05/20/254:43 PM

## 2023-10-10 NOTE — Therapy (Incomplete)
 OUTPATIENT PHYSICAL THERAPY SHOULDER TREATMENT   Patient Name: David [name] MRN: [id number] DOB:[birth year], 73 y.o., male Today's Date: 10/10/2023  END OF SESSION:     Past Medical History:  Diagnosis Date   Arthritis    Balance problem    Barrett's esophagus    BBB (bundle branch block) 03/15/2016   Carpal tunnel syndrome    DDD (degenerative disc disease), lumbar 03/15/2016   Depression    Dysrhythmia    had some tachycardia with steroids   Elevated cholesterol 03/15/2016   Headache(784.0)    Ocular Migraines - takes Ambien  for it   Heart murmur    HTN (hypertension) 03/15/2016   Hyperlipemia    Hypertension    Malignant melanoma of right side of neck (HCC) 03/15/2016   73 years old   Neuralgia 03/15/2016   Ocular migraine    Osteoarthritis    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    RLS (restless legs syndrome)    Sleep apnea    uses CPAP   Snores    Status post gastric banding    Wears glasses    driving   Past Surgical History:  Procedure Laterality Date   ANTERIOR CERVICAL DECOMPRESSION/DISCECTOMY FUSION 4 LEVELS N/A 02/26/2020   Procedure: ANTERIOR CERVICAL DECOMPRESSION/DISCECTOMY FUSION, INTERBODY PROSTHESIS, PLATE/SCREWS CERVICAL THREE-FOUR CERVICAL FOUR-FIVE CERVICAL FIVE-SIX- CERVICAL SIX-SEVEN;  Surgeon: Garry Kansas, MD;  Location: [facility] OR;  Service: Neurosurgery;  Laterality: N/A;   CARPAL TUNNEL RELEASE Right 09/26/2012   Procedure: CARPAL TUNNEL RELEASE;  Surgeon: Milagros Alf, MD;  Location: [facility];  Service: Orthopedics;  Laterality: Right;   CARPAL TUNNEL RELEASE Left 10/24/2012   Procedure: CARPAL TUNNEL RELEASE;  Surgeon: Milagros Alf, MD;  Location: [facility];  Service: Orthopedics;  Laterality: Left;   CIRCUMCISION     as a baby   EXCISION MELANOMA WITH SENTINEL LYMPH NODE BIOPSY  1984   rt neck with mulpipal nodes and some  muscle excised rt neck-shoulder   KNEE ARTHROPLASTY     knee replacement     LAPAROSCOPIC GASTRIC BANDING  03/15/2009   LAPAROSCOPIC GASTRIC BANDING     MUSCLE BIOPSY  2004   rt thigh to r/o myocitis   nerve conduction velosity test     REVERSE SHOULDER ARTHROPLASTY Right 08/21/2023   Procedure: ARTHROPLASTY, SHOULDER, TOTAL, REVERSE;  Surgeon: Jasmine Mesi, MD;  Location: [facility] OR;  Service: Orthopedics;  Laterality: Right;  RIGHT REVERSE SHOULDER ARTHROPLASTY   SHOULDER SURGERY Right 12/2021   TONSILLECTOMY     TOTAL KNEE ARTHROPLASTY Bilateral 12/05/2010   TRIGGER FINGER RELEASE Right 09/26/2012   Procedure: RELEASE TRIGGER FINGER/A-1 PULLEY RING AND SMALL ;  Surgeon: Milagros Alf, MD;  Location: [facility];  Service: Orthopedics;  Laterality: Right;   TRIGGER FINGER RELEASE Left 10/24/2012   Procedure: RELEASE TRIGGER FINGER/A-1 PULLEY LEFT MIDDLE AND LEFT RING;  Surgeon: Milagros Alf, MD;  Location: [facility];  Service: Orthopedics;  Laterality: Left;   UPPER GI ENDOSCOPY     Patient Active Problem List   Diagnosis Date Noted   Rotator cuff arthropathy, right 08/21/2023   S/P reverse total shoulder arthroplasty, right 08/21/2023   Sepsis due to undetermined organism (HCC) 08/20/2022   Cervical spondylosis with radiculopathy 02/26/2020   SBO (small bowel obstruction) (HCC) 08/21/2017   Trochanteric bursitis of left hip 01/18/2017   Lateral epicondylitis,  right elbow 01/18/2017   Rheumatoid arthritis (HCC) 03/15/2016   DJD (degenerative joint disease), cervical 03/15/2016   DDD (degenerative disc disease), lumbar 03/15/2016   HTN (hypertension) 03/15/2016   Obesity 03/15/2016   Elevated cholesterol 03/15/2016   Neuralgia 03/15/2016   Malignant melanoma of right side of neck (HCC) 03/15/2016   BBB (bundle branch block) 03/15/2016   High risk medication use 03/15/2016   Osteoarthritis of lumbar spine 03/15/2016   Primary osteoarthritis of  both hands 03/15/2016   Primary osteoarthritis of both knees 03/15/2016   Mesenteric adenitis 06/29/2015    PCP: Stamey, Lowell Rude, FNP   REFERRING PROVIDER: Casilda Clayman, PA-C   REFERRING DIAG: [phone] (ICD-10-CM) - S/P reverse total shoulder arthroplasty, Right (With subscapularis repair)  THERAPY DIAG:  No diagnosis found.  Rationale for Evaluation and Treatment: Rehabilitation  ONSET DATE: 08/21/23 DOS  SUBJECTIVE:                                                                                                                                                                                      SUBJECTIVE STATEMENT: ***  10/09/23: 7 weeks post op Hand dominance: Right  PERTINENT HISTORY: H/o falls,  ACDF, B TKA, HTN, h/o malignant melanoma in R neck affecting R trapezius muscle and R UE ROM  PAIN:  Are you having pain? No  PRECAUTIONS: Shoulder As of 10/03/23: Able to lift up to but not beyond 20#.  At Eval: Okay to very lightly lift with the operative extremity but no lifting anything heavier than a coffee cup or cell phone. Start physical therapy to focus on passive range of motion and active range of motion with deltoid isometrics. Do not want to externally rotate past 30 degrees to protect subscapularis repair.   RED FLAGS: None   WEIGHT BEARING RESTRICTIONS: Yes R shoulder  FALLS:  Has patient fallen in last 6 months? Yes. Number of falls 4 tripping over things in cluttered garage  LIVING ENVIRONMENT: Lives with: lives with their spouse Lives in: House/apartment Stairs: Yes: Internal: 15 steps; on left going up and bilateral but cannot reach both and External: 3 steps; no railing on deck Has following equipment at home: Single point cane and Crutches  OCCUPATION: retired  PLOF: Independent  PATIENT GOALS:to be able to sleep in bed on side without pain, currently on couch or in bed on back  NEXT MD VISIT: May 14 or 16  OBJECTIVE:  Note: Objective  measures were completed at Evaluation unless otherwise noted.  DIAGNOSTIC FINDINGS:  none  PATIENT SURVEYS:  Quick Dash  72.7 / 100 = 72.7 %  COGNITION: Overall cognitive status: Within functional limits for  tasks assessed     SENSATION: Mild pull in post distal/lateral  wrist  POSTURE: Rounded shoulders  UPPER EXTREMITY ROM:   A/P ROM Right eval Left eval Left 5/8//25  Shoulder flexion 130/142    Shoulder extension     Shoulder abduction 100 P    Shoulder adduction     Shoulder internal rotation     Shoulder external rotation 30/30    Elbow flexion     Elbow extension -23  -16  Wrist flexion WFL    Wrist extension     Wrist ulnar deviation     Wrist radial deviation     Wrist pronation     Wrist supination     (Blank rows = not tested) R wrist limited since melanoma surgery  UPPER EXTREMITY MMT: NT due to surgery  MMT Right eval Left eval  Shoulder flexion    Shoulder extension    Shoulder abduction    Shoulder adduction    Shoulder internal rotation    Shoulder external rotation    Middle trapezius    Lower trapezius    Elbow flexion    Elbow extension    Wrist flexion    Wrist extension    Wrist ulnar deviation    Wrist radial deviation    Wrist pronation    Wrist supination    Grip strength (lbs)    (Blank rows = not tested)   PALPATION:  TTP in ant and post shoulder, wrist extensors   [facility] Adult PT Treatment:                                                DATE: 10/11/2023 Therapeutic Exercise: Standing ball on wall Noodle scap retraction Seated biceps curl 5# 2 x 10 Seated flexion  Seated scaption Supine flexion to 90 deg with tband Elbow extension stretch in supine with 3# x 1 min then 5# x 1 min (passive stretch to -12 degrees) Neuromuscular re-ed: Prone T to neutral with yellow TB x 10 Prone ext  with yellow Tband to neutral -   Supine circles CW/CCW with 3# wt  (90 deg flexion) Supine Alphabet with 3# wt (90 deg  flexion) Reactive isometrics ER + RTB, green ext 10 each Manual Therapy: PROM R elbow and supination/pronation  (supine) Supine rhythmic stabilization at 90 - 125 deg   [facility] Adult PT Treatment:                                                DATE: 10/09/2023 Therapeutic Exercise: Pulleys shoulder flexion & scaption x 2 min each Elbow extension stretch in supine with 3# x 1 min then 5# x 1 min (passive stretch to -12 degrees) Seated biceps curl 5# 2 x 10 Neuromuscular re-ed: Prone T x 10 Prone ext x 3 -  held due to popping in shoulder; also attempted rows but popping persisted Supine circles CW/CCW with 3# wt  (90 deg flexion) Supine Alphabet with 3# wt (90 deg flexion) Reactive isometrics ER + RTB, green ext 10 each Manual Therapy: PROM R elbow and supination/pronation  (supine)    [facility] Adult PT Treatment:  DATE: 10/03/23 Therapeutic Exercise/Activity: Elbow flex/ext Pronation/supination Isometrics: ER, flexion, abd all 10 x 10 sec hold Manual Therapy: PROM Rt shoulder per protocol   [facility] Adult PT Treatment:                                                DATE: 10/01/2023 Therapeutic Exercise: Pulleys shoulder flexion & scaption x 2 min each Elbow extension, supination/pronation PROM (supine) Neuromuscular re-ed: Isometrics: ER, flexion, abd 10x10" each Scap squeeze 8x10" with noodle against back Reactive isometrics ER + RTB, green flex and abd x 10 each Therapeutic Activity: TUG 5xSTS Finger ladder flexion x 10 with scapular retraction and cues to avoid lumbar extension   [facility] Adult PT Treatment:                                                DATE:09/27/23 Therapeutic Exercise/Activity: Pulley flexion x 2 min Pulley scaption/abduction x 2 min Isometrics: ER, flexion, abd all 10 x 10 sec hold Reactive isometrics TB red ER, green flex and abd x 10 each Scap squeeze 2 x 10 Finger ladder flexion x 10 with scapular retraction  and cues to avoid lumbar extension Manual Therapy: PROM/stretching R wrist flex/ext, forearm pro/sup and elbow extension                                                                                                                             PATIENT EDUCATION: Education details: PT eval findings, anticipated POC, initial HEP, and reviewed precautions and limitations  Person educated: Patient Education method: Explanation, Demonstration, and Handouts Education comprehension: verbalized understanding and returned demonstration  HOME EXERCISE PROGRAM: Access Code: [email or web address] URL: [email or web address] Date: 09/18/2023 Prepared by: Lowery Rue  Exercises - Seated Shoulder External Rotation AAROM with Cane and Hand in Neutral  - 1-2 x daily - 3 x weekly - 2 sets - 10 reps - Supine Shoulder Flexion Extension AAROM with Dowel  - 2 x daily - 7 x weekly - 1-3 sets - 10 reps - Wrist Flexion Extension AROM with Fingers Curled and Palm Down  - 2 x daily - 7 x weekly - 2 sets - 10 reps - Forearm Pronation and Supination With Arm Supported  - 2 x daily - 7 x weekly - 2 sets - 10 reps - Seated Scapular Retraction  - 1 x daily - 7 x weekly - 3 sets - 10 reps - Isometric Shoulder Abduction at Wall  - 1 x daily - 7 x weekly - 1 sets - 10 reps - 3 seconds hold - Isometric Shoulder Extension at Wall  - 1 x daily - 7 x weekly - 1 sets - 10 reps - 3 seconds  hold - Isometric Shoulder Flexion at Wall  - 1 x daily - 7 x weekly - 1 sets - 10 reps - 3 seconds hold  ASSESSMENT:  CLINICAL IMPRESSION: ***   OBJECTIVE IMPAIRMENTS: decreased activity tolerance, decreased balance, decreased ROM, decreased strength, increased edema, increased muscle spasms, impaired flexibility, impaired sensation, impaired UE functional use, postural dysfunction, obesity, and pain.    GOALS: Goals reviewed with patient? Yes  SHORT TERM GOALS: Target date: 10/11/2023   Patient will be independent with  initial HEP.  Baseline:  Goal status: IN PROGRESS  2. Balance screen performed and appropriate goals set if indicated.  Baseline:  10/01/23: TUG - 8 sec; 5xSTS - 12 sec Goal status: MET    LONG TERM GOALS: Target date: 11/08/2023   Patient will be independent with advanced/ongoing HEP to improve outcomes and carryover.  Baseline:  Goal status: INITIAL  2.   Decreased Quick Dash by >= 10 points demonstrating improved functional ability.  Baseline: 72.7 Goal status: INITIAL  3.  Patient to improve R shoulder AROM to [facility] without pain provocation to allow for increased ease of ADLs.  Baseline:  Goal status: INITIAL  4.  Patient will demonstrate improved UE strength to 4+/5 to perform ADLS  Baseline:  Goal status: INITIAL  5. Patient to report no pain in R forearm with ADLs.  Baseline:  Goal status: INITIAL  6.  Patient will score >19/24 on the DGI to decrease fall risk.   Baseline: TBD Goal status: INITIAL   PLAN:  PT FREQUENCY: 2x/week  PT DURATION: 8 weeks  PLANNED INTERVENTIONS: 97164- PT Re-evaluation, 97110-Therapeutic exercises, 97530- Therapeutic activity, 97112- Neuromuscular re-education, 97535- Self Care, 96045- Manual therapy, [id number]- Electrical stimulation (unattended), 97016- Vasopneumatic device, 97033- Ionotophoresis 4mg /ml Dexamethasone , Patient/Family education, Balance training, Taping, Dry Needling, Joint mobilization, Spinal mobilization, Cryotherapy, and Moist heat  PLAN FOR NEXT SESSION: Passive stretch wrist/forearm and elbow, progress scapular strength per protocol. P/AROM per protocol, deltoid isometrics. NO ER past 30 degrees to protect subscapularis repair.  Elbow and wrist ROM. Pt able to lift up to 20# per MD note.   Jinx Mourning, PT 05/21/255:10 PM

## 2023-10-11 ENCOUNTER — Telehealth: Payer: Self-pay | Admitting: Physical Therapy

## 2023-10-11 ENCOUNTER — Ambulatory Visit: Admitting: Physical Therapy

## 2023-10-11 DIAGNOSIS — E237 Disorder of pituitary gland, unspecified: Secondary | ICD-10-CM | POA: Diagnosis not present

## 2023-10-11 NOTE — Telephone Encounter (Signed)
 LM regarding missed appt and next appt scheducled.

## 2023-10-15 NOTE — Therapy (Signed)
 OUTPATIENT PHYSICAL THERAPY SHOULDER TREATMENT   Patient Name: David [name] MRN: [id number] DOB:Mar 09, 1951, 73 y.o., male Today's Date: 10/16/2023  END OF SESSION:  PT End of Session - 10/16/23 1448     Visit Number 8    Date for PT Re-Evaluation 11/08/23    Authorization Type [employer] MCR    Progress Note Due on Visit 10    PT Start Time 1448    PT Stop Time 1529    PT Time Calculation (min) 41 min    Activity Tolerance Patient tolerated treatment well    Behavior During Therapy WFL for tasks assessed/performed               Past Medical History:  Diagnosis Date   Arthritis    Balance problem    Barrett's esophagus    BBB (bundle branch block) 03/15/2016   Carpal tunnel syndrome    DDD (degenerative disc disease), lumbar 03/15/2016   Depression    Dysrhythmia    had some tachycardia with steroids   Elevated cholesterol 03/15/2016   Headache(784.0)    Ocular Migraines - takes Ambien  for it   Heart murmur    HTN (hypertension) 03/15/2016   Hyperlipemia    Hypertension    Malignant melanoma of right side of neck (HCC) 03/15/2016   73 years old   Neuralgia 03/15/2016   Ocular migraine    Osteoarthritis    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    RLS (restless legs syndrome)    Sleep apnea    uses CPAP   Snores    Status post gastric banding    Wears glasses    driving   Past Surgical History:  Procedure Laterality Date   ANTERIOR CERVICAL DECOMPRESSION/DISCECTOMY FUSION 4 LEVELS N/A 02/26/2020   Procedure: ANTERIOR CERVICAL DECOMPRESSION/DISCECTOMY FUSION, INTERBODY PROSTHESIS, PLATE/SCREWS CERVICAL THREE-FOUR CERVICAL FOUR-FIVE CERVICAL FIVE-SIX- CERVICAL SIX-SEVEN;  Surgeon: Garry Kansas, MD;  Location: [facility] OR;  Service: Neurosurgery;  Laterality: N/A;   CARPAL TUNNEL RELEASE Right 09/26/2012   Procedure: CARPAL TUNNEL RELEASE;  Surgeon: Milagros Alf, MD;  Location: [facility];  Service: Orthopedics;  Laterality: Right;   CARPAL TUNNEL RELEASE Left 10/24/2012   Procedure: CARPAL TUNNEL RELEASE;  Surgeon: Milagros Alf, MD;  Location: [facility];  Service: Orthopedics;  Laterality: Left;   CIRCUMCISION     as a baby   EXCISION MELANOMA WITH SENTINEL LYMPH NODE BIOPSY  1984   rt neck with mulpipal nodes and some muscle excised rt neck-shoulder   KNEE ARTHROPLASTY     knee replacement     LAPAROSCOPIC GASTRIC BANDING  03/15/2009   LAPAROSCOPIC GASTRIC BANDING     MUSCLE BIOPSY  2004   rt thigh to r/o myocitis   nerve conduction velosity test     REVERSE SHOULDER ARTHROPLASTY Right 08/21/2023   Procedure: ARTHROPLASTY, SHOULDER, TOTAL, REVERSE;  Surgeon: Jasmine Mesi, MD;  Location: [facility] OR;  Service: Orthopedics;  Laterality: Right;  RIGHT REVERSE SHOULDER ARTHROPLASTY   SHOULDER SURGERY Right 12/2021   TONSILLECTOMY     TOTAL KNEE ARTHROPLASTY Bilateral 12/05/2010   TRIGGER FINGER RELEASE Right 09/26/2012   Procedure: RELEASE TRIGGER FINGER/A-1 PULLEY RING AND SMALL ;  Surgeon: Milagros Alf, MD;  Location: [facility];  Service: Orthopedics;  Laterality: Right;   TRIGGER FINGER RELEASE Left 10/24/2012   Procedure: RELEASE TRIGGER FINGER/A-1 PULLEY LEFT MIDDLE  AND LEFT RING;  Surgeon: Milagros Alf, MD;  Location: [facility];  Service: Orthopedics;  Laterality: Left;   UPPER GI ENDOSCOPY     Patient Active Problem List   Diagnosis Date Noted   Rotator cuff arthropathy, right 08/21/2023   S/P reverse total shoulder arthroplasty, right 08/21/2023   Sepsis due to undetermined organism (HCC) 08/20/2022   Cervical spondylosis with radiculopathy 02/26/2020   SBO (small bowel obstruction) (HCC) 08/21/2017   Trochanteric bursitis of left hip 01/18/2017   Lateral epicondylitis, right elbow 01/18/2017   Rheumatoid arthritis (HCC) 03/15/2016   DJD (degenerative joint disease), cervical 03/15/2016   DDD  (degenerative disc disease), lumbar 03/15/2016   HTN (hypertension) 03/15/2016   Obesity 03/15/2016   Elevated cholesterol 03/15/2016   Neuralgia 03/15/2016   Malignant melanoma of right side of neck (HCC) 03/15/2016   BBB (bundle branch block) 03/15/2016   High risk medication use 03/15/2016   Osteoarthritis of lumbar spine 03/15/2016   Primary osteoarthritis of both hands 03/15/2016   Primary osteoarthritis of both knees 03/15/2016   Mesenteric adenitis 06/29/2015    PCP: Stamey, Lowell Rude, FNP   REFERRING PROVIDER: Casilda Clayman, PA-C   REFERRING DIAG: [phone] (ICD-10-CM) - S/P reverse total shoulder arthroplasty, Right (With subscapularis repair)  THERAPY DIAG:  Stiffness of right shoulder, not elsewhere classified  Muscle weakness (generalized)  Unsteadiness on feet  Localized edema  Pain in right arm  Rationale for Evaluation and Treatment: Rehabilitation  ONSET DATE: 08/21/23 DOS  SUBJECTIVE:                                                                                                                                                                                      SUBJECTIVE STATEMENT: Shoulder feeling good.   10/09/23: 7 weeks post op Hand dominance: Right  PERTINENT HISTORY: H/o falls,  ACDF, B TKA, HTN, h/o malignant melanoma in R neck affecting R trapezius muscle and R UE ROM  PAIN:  Are you having pain? No  PRECAUTIONS: Shoulder As of 10/03/23: Able to lift up to but not beyond 20#.  At Eval: Okay to very lightly lift with the operative extremity but no lifting anything heavier than a coffee cup or cell phone. Start physical therapy to focus on passive range of motion and active range of motion with deltoid isometrics. Do not want to externally rotate past 30 degrees to protect subscapularis repair.   RED FLAGS: None   WEIGHT BEARING RESTRICTIONS: Yes R shoulder  FALLS:  Has patient fallen in last 6 months? Yes. Number of falls 4  tripping over things in cluttered garage  LIVING ENVIRONMENT: Lives with: lives  with their spouse Lives in: House/apartment Stairs: Yes: Internal: 15 steps; on left going up and bilateral but cannot reach both and External: 3 steps; no railing on deck Has following equipment at home: Single point cane and Crutches  OCCUPATION: retired  PLOF: Independent  PATIENT GOALS:to be able to sleep in bed on side without pain, currently on couch or in bed on back  NEXT MD VISIT: May 29  OBJECTIVE:  Note: Objective measures were completed at Evaluation unless otherwise noted.  DIAGNOSTIC FINDINGS:  none  PATIENT SURVEYS:  Quick Dash  72.7 / 100 = 72.7 %  COGNITION: Overall cognitive status: Within functional limits for tasks assessed     SENSATION: Mild pull in post distal/lateral  wrist  POSTURE: Rounded shoulders  UPPER EXTREMITY ROM:   A/P ROM Right eval Left eval Right 5/8//25 Right  Shoulder flexion 130/142   150 active supine  Shoulder extension      Shoulder abduction 100 P   118 active  Shoulder adduction      Shoulder internal rotation    65  Shoulder external rotation 30/30   50  Elbow flexion      Elbow extension -23  -16 -11/0  Wrist flexion WFL     Wrist extension      Wrist ulnar deviation      Wrist radial deviation      Wrist pronation      Wrist supination      (Blank rows = not tested) R wrist limited since melanoma surgery  UPPER EXTREMITY MMT: NT due to surgery  MMT Right eval Left eval  Shoulder flexion    Shoulder extension    Shoulder abduction    Shoulder adduction    Shoulder internal rotation    Shoulder external rotation    Middle trapezius    Lower trapezius    Elbow flexion    Elbow extension    Wrist flexion    Wrist extension    Wrist ulnar deviation    Wrist radial deviation    Wrist pronation    Wrist supination    Grip strength (lbs)    (Blank rows = not tested)   PALPATION:  TTP in ant and post shoulder, wrist  extensors   [facility] Adult PT Treatment:                                                DATE: 10/16/2023 Therapeutic Exercise: Noodle scap retraction 5 sec hold x 10 Standing flexion to 90 deg x 10 Standing scaption to 90 deg x 10 Seated biceps curl 5# 2 x 10 Supine flexion to 90 deg with tband Elbow extension stretch in supine 3x30 sec with PT overpressure Neuromuscular re-ed: Standing ball on wall using red plyoball 2 direction Prone T and ext too difficult secondary to melanoma surgery Supine circles CW/CCW with 3# wt  (90 deg flexion) Supine Alphabet with 3# wt (90 deg flexion) Manual Therapy: PROM R elbow and supination/pronation  (supine) Supine rhythmic stabilization at 90 - deg Measurements taken   [facility] Adult PT Treatment:                                                DATE:  10/09/2023 Therapeutic Exercise: Pulleys shoulder flexion & scaption x 2 min each Elbow extension stretch in supine with 3# x 1 min then 5# x 1 min (passive stretch to -12 degrees) Seated biceps curl 5# 2 x 10 Neuromuscular re-ed: Prone T x 10 Prone ext x 3 -  held due to popping in shoulder; also attempted rows but popping persisted Supine circles CW/CCW with 3# wt  (90 deg flexion) Supine Alphabet with 3# wt (90 deg flexion) Reactive isometrics ER + RTB, green ext 10 each Manual Therapy: PROM R elbow and supination/pronation  (supine)    [facility] Adult PT Treatment:                                                DATE: 10/03/23 Therapeutic Exercise/Activity: Elbow flex/ext Pronation/supination Isometrics: ER, flexion, abd all 10 x 10 sec hold Manual Therapy: PROM Rt shoulder per protocol   [facility] Adult PT Treatment:                                                DATE: 10/01/2023 Therapeutic Exercise: Pulleys shoulder flexion & scaption x 2 min each Elbow extension, supination/pronation PROM (supine) Neuromuscular re-ed: Isometrics: ER, flexion, abd 10x10" each Scap squeeze 8x10" with noodle against  back Reactive isometrics ER + RTB, green flex and abd x 10 each Therapeutic Activity: TUG 5xSTS Finger ladder flexion x 10 with scapular retraction and cues to avoid lumbar extension   [facility] Adult PT Treatment:                                                DATE:09/27/23 Therapeutic Exercise/Activity: Pulley flexion x 2 min Pulley scaption/abduction x 2 min Isometrics: ER, flexion, abd all 10 x 10 sec hold Reactive isometrics TB red ER, green flex and abd x 10 each Scap squeeze 2 x 10 Finger ladder flexion x 10 with scapular retraction and cues to avoid lumbar extension Manual Therapy: PROM/stretching R wrist flex/ext, forearm pro/sup and elbow extension                                                                                                                             PATIENT EDUCATION: Education details: PT eval findings, anticipated POC, initial HEP, and reviewed precautions and limitations  Person educated: Patient Education method: Explanation, Demonstration, and Handouts Education comprehension: verbalized understanding and returned demonstration  HOME EXERCISE PROGRAM: Access Code: [email or web address] URL: [email or web address] Date: 09/18/2023 Prepared by: Lowery Rue  Exercises - Seated Shoulder External Rotation AAROM with Cane and Hand in Neutral  -  1-2 x daily - 3 x weekly - 2 sets - 10 reps - Supine Shoulder Flexion Extension AAROM with Dowel  - 2 x daily - 7 x weekly - 1-3 sets - 10 reps - Wrist Flexion Extension AROM with Fingers Curled and Palm Down  - 2 x daily - 7 x weekly - 2 sets - 10 reps - Forearm Pronation and Supination With Arm Supported  - 2 x daily - 7 x weekly - 2 sets - 10 reps - Seated Scapular Retraction  - 1 x daily - 7 x weekly - 3 sets - 10 reps - Isometric Shoulder Abduction at Wall  - 1 x daily - 7 x weekly - 1 sets - 10 reps - 3 seconds hold - Isometric Shoulder Extension at Wall  - 1 x daily - 7 x weekly - 1 sets - 10 reps - 3  seconds hold - Isometric Shoulder Flexion at Wall  - 1 x daily - 7 x weekly - 1 sets - 10 reps - 3 seconds hold  ASSESSMENT:  CLINICAL IMPRESSION: Patient is progressing with shoulder and elbow ROM. Posterior shoulder strengthening is limited by his past melanoma surgery so unable to tolerate prone ext or horizontal ABD. Shoulder stabilization exercises fatigue the shoulder. He is progressing appropriately.    OBJECTIVE IMPAIRMENTS: decreased activity tolerance, decreased balance, decreased ROM, decreased strength, increased edema, increased muscle spasms, impaired flexibility, impaired sensation, impaired UE functional use, postural dysfunction, obesity, and pain.    GOALS: Goals reviewed with patient? Yes  SHORT TERM GOALS: Target date: 10/11/2023   Patient will be independent with initial HEP.  Baseline:  Goal status: IN PROGRESS  2. Balance screen performed and appropriate goals set if indicated.  Baseline:  10/01/23: TUG - 8 sec; 5xSTS - 12 sec Goal status: MET    LONG TERM GOALS: Target date: 11/08/2023   Patient will be independent with advanced/ongoing HEP to improve outcomes and carryover.  Baseline:  Goal status: INITIAL  2.   Decreased Quick Dash by >= 10 points demonstrating improved functional ability.  Baseline: 72.7 Goal status: INITIAL  3.  Patient to improve R shoulder AROM to [facility] without pain provocation to allow for increased ease of ADLs.  Baseline:  Goal status: INITIAL  4.  Patient will demonstrate improved UE strength to 4+/5 to perform ADLS  Baseline:  Goal status: INITIAL  5. Patient to report no pain in R forearm with ADLs.  Baseline:  Goal status: INITIAL  6.  Patient will score >19/24 on the DGI to decrease fall risk.   Baseline: TBD Goal status: INITIAL   PLAN:  PT FREQUENCY: 2x/week  PT DURATION: 8 weeks  PLANNED INTERVENTIONS: 97164- PT Re-evaluation, 97110-Therapeutic exercises, 97530- Therapeutic activity, 97112- Neuromuscular  re-education, 97535- Self Care, 21308- Manual therapy, [id number]- Electrical stimulation (unattended), 97016- Vasopneumatic device, 97033- Ionotophoresis 4mg /ml Dexamethasone , Patient/Family education, Balance training, Taping, Dry Needling, Joint mobilization, Spinal mobilization, Cryotherapy, and Moist heat  PLAN FOR NEXT SESSION: Note: Prone shoulder strength affected by Melanoma surgery so limited in ability: Passive stretch wrist/forearm and elbow, progress scapular strength per protocol. P/AROM per protocol, deltoid isometrics. NO resisted shoulder ext/IR, don't push ER.  Pt able to lift up to 20# per MD note.   Jinx Mourning, PT 05/27/254:39 PM

## 2023-10-16 ENCOUNTER — Encounter: Payer: Self-pay | Admitting: Physical Therapy

## 2023-10-16 ENCOUNTER — Ambulatory Visit: Admitting: Physical Therapy

## 2023-10-16 DIAGNOSIS — M79601 Pain in right arm: Secondary | ICD-10-CM | POA: Diagnosis not present

## 2023-10-16 DIAGNOSIS — R600 Localized edema: Secondary | ICD-10-CM | POA: Diagnosis not present

## 2023-10-16 DIAGNOSIS — R2681 Unsteadiness on feet: Secondary | ICD-10-CM

## 2023-10-16 DIAGNOSIS — M6281 Muscle weakness (generalized): Secondary | ICD-10-CM | POA: Diagnosis not present

## 2023-10-16 DIAGNOSIS — M25611 Stiffness of right shoulder, not elsewhere classified: Secondary | ICD-10-CM

## 2023-10-17 ENCOUNTER — Other Ambulatory Visit: Payer: Self-pay | Admitting: Surgical

## 2023-10-17 NOTE — Therapy (Signed)
 OUTPATIENT PHYSICAL THERAPY SHOULDER TREATMENT   Patient Name: David [name] MRN: [id number] DOB:1951-04-10, 73 y.o., male Today's Date: 10/18/2023  END OF SESSION:  PT End of Session - 10/18/23 1020     Visit Number 9    Date for PT Re-Evaluation 11/08/23    Authorization Type [employer] MCR    Authorization - Visit Number 8    Progress Note Due on Visit 10    PT Start Time 1018    PT Stop Time 1059    PT Time Calculation (min) 41 min    Activity Tolerance Patient tolerated treatment well    Behavior During Therapy WFL for tasks assessed/performed                Past Medical History:  Diagnosis Date   Arthritis    Balance problem    Barrett's esophagus    BBB (bundle branch block) 03/15/2016   Carpal tunnel syndrome    DDD (degenerative disc disease), lumbar 03/15/2016   Depression    Dysrhythmia    had some tachycardia with steroids   Elevated cholesterol 03/15/2016   Headache(784.0)    Ocular Migraines - takes Ambien  for it   Heart murmur    HTN (hypertension) 03/15/2016   Hyperlipemia    Hypertension    Malignant melanoma of right side of neck (HCC) 03/15/2016   73 years old   Neuralgia 03/15/2016   Ocular migraine    Osteoarthritis    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    RLS (restless legs syndrome)    Sleep apnea    uses CPAP   Snores    Status post gastric banding    Wears glasses    driving   Past Surgical History:  Procedure Laterality Date   ANTERIOR CERVICAL DECOMPRESSION/DISCECTOMY FUSION 4 LEVELS N/A 02/26/2020   Procedure: ANTERIOR CERVICAL DECOMPRESSION/DISCECTOMY FUSION, INTERBODY PROSTHESIS, PLATE/SCREWS CERVICAL THREE-FOUR CERVICAL FOUR-FIVE CERVICAL FIVE-SIX- CERVICAL SIX-SEVEN;  Surgeon: Garry Kansas, MD;  Location: [facility] OR;  Service: Neurosurgery;  Laterality: N/A;   CARPAL TUNNEL RELEASE Right 09/26/2012   Procedure: CARPAL TUNNEL RELEASE;  Surgeon: Milagros Alf, MD;  Location: [facility];  Service: Orthopedics;  Laterality: Right;   CARPAL TUNNEL RELEASE Left 10/24/2012   Procedure: CARPAL TUNNEL RELEASE;  Surgeon: Milagros Alf, MD;  Location: [facility];  Service: Orthopedics;  Laterality: Left;   CIRCUMCISION     as a baby   EXCISION MELANOMA WITH SENTINEL LYMPH NODE BIOPSY  1984   rt neck with mulpipal nodes and some muscle excised rt neck-shoulder   KNEE ARTHROPLASTY     knee replacement     LAPAROSCOPIC GASTRIC BANDING  03/15/2009   LAPAROSCOPIC GASTRIC BANDING     MUSCLE BIOPSY  2004   rt thigh to r/o myocitis   nerve conduction velosity test     REVERSE SHOULDER ARTHROPLASTY Right 08/21/2023   Procedure: ARTHROPLASTY, SHOULDER, TOTAL, REVERSE;  Surgeon: Jasmine Mesi, MD;  Location: [facility] OR;  Service: Orthopedics;  Laterality: Right;  RIGHT REVERSE SHOULDER ARTHROPLASTY   SHOULDER SURGERY Right 12/2021   TONSILLECTOMY     TOTAL KNEE ARTHROPLASTY Bilateral 12/05/2010   TRIGGER FINGER RELEASE Right 09/26/2012   Procedure: RELEASE TRIGGER FINGER/A-1 PULLEY RING AND SMALL ;  Surgeon: Milagros Alf, MD;  Location: [facility];  Service: Orthopedics;  Laterality: Right;   TRIGGER FINGER RELEASE Left 10/24/2012  Procedure: RELEASE TRIGGER FINGER/A-1 PULLEY LEFT MIDDLE AND LEFT RING;  Surgeon: Milagros Alf, MD;  Location: [facility];  Service: Orthopedics;  Laterality: Left;   UPPER GI ENDOSCOPY     Patient Active Problem List   Diagnosis Date Noted   Rotator cuff arthropathy, right 08/21/2023   S/P reverse total shoulder arthroplasty, right 08/21/2023   Sepsis due to undetermined organism (HCC) 08/20/2022   Cervical spondylosis with radiculopathy 02/26/2020   SBO (small bowel obstruction) (HCC) 08/21/2017   Trochanteric bursitis of left hip 01/18/2017   Lateral epicondylitis, right elbow 01/18/2017   Rheumatoid arthritis (HCC) 03/15/2016   DJD (degenerative joint  disease), cervical 03/15/2016   DDD (degenerative disc disease), lumbar 03/15/2016   HTN (hypertension) 03/15/2016   Obesity 03/15/2016   Elevated cholesterol 03/15/2016   Neuralgia 03/15/2016   Malignant melanoma of right side of neck (HCC) 03/15/2016   BBB (bundle branch block) 03/15/2016   High risk medication use 03/15/2016   Osteoarthritis of lumbar spine 03/15/2016   Primary osteoarthritis of both hands 03/15/2016   Primary osteoarthritis of both knees 03/15/2016   Mesenteric adenitis 06/29/2015    PCP: Stamey, Lowell Rude, FNP   REFERRING PROVIDER: Casilda Clayman, PA-C   REFERRING DIAG: [phone] (ICD-10-CM) - S/P reverse total shoulder arthroplasty, Right (With subscapularis repair)  THERAPY DIAG:  Stiffness of right shoulder, not elsewhere classified  Muscle weakness (generalized)  Unsteadiness on feet  Localized edema  Rationale for Evaluation and Treatment: Rehabilitation  ONSET DATE: 08/21/23 DOS  SUBJECTIVE:                                                                                                                                                                                      SUBJECTIVE STATEMENT: My left shoulder is hurting now.    10/16/23: 8 weeks post op Hand dominance: Right  PERTINENT HISTORY: H/o falls,  ACDF, B TKA, HTN, h/o malignant melanoma in R neck affecting R trapezius muscle and R UE ROM  PAIN:  Are you having pain? No  PRECAUTIONS: Shoulder As of 10/03/23: Able to lift up to but not beyond 20#.  At Eval: Okay to very lightly lift with the operative extremity but no lifting anything heavier than a coffee cup or cell phone. Start physical therapy to focus on passive range of motion and active range of motion with deltoid isometrics. Do not want to externally rotate past 30 degrees to protect subscapularis repair.   RED FLAGS: None   WEIGHT BEARING RESTRICTIONS: Yes R shoulder  FALLS:  Has patient fallen in last 6 months?  Yes. Number of falls 4 tripping over things in cluttered garage  LIVING ENVIRONMENT: Lives with: lives with their spouse Lives in: House/apartment Stairs: Yes: Internal: 15 steps; on left going up and bilateral but cannot reach both and External: 3 steps; no railing on deck Has following equipment at home: Single point cane and Crutches  OCCUPATION: retired  PLOF: Independent  PATIENT GOALS:to be able to sleep in bed on side without pain, currently on couch or in bed on back  NEXT MD VISIT: May 29  OBJECTIVE:  Note: Objective measures were completed at Evaluation unless otherwise noted.  DIAGNOSTIC FINDINGS:  none  PATIENT SURVEYS:  Quick Dash  72.7 / 100 = 72.7 %  COGNITION: Overall cognitive status: Within functional limits for tasks assessed     SENSATION: Mild pull in post distal/lateral  wrist  POSTURE: Rounded shoulders  UPPER EXTREMITY ROM:   A/P ROM Right eval Left eval Right 5/8//25 Right 5/27  Shoulder flexion 130/142   150 active supine  Shoulder extension      Shoulder abduction 100 P   118 active  Shoulder adduction      Shoulder internal rotation    65  Shoulder external rotation 30/30   50  Elbow flexion      Elbow extension -23  -16 -11/0  Wrist flexion WFL     Wrist extension      Wrist ulnar deviation      Wrist radial deviation      Wrist pronation      Wrist supination      (Blank rows = not tested) R wrist limited since melanoma surgery  UPPER EXTREMITY MMT: NT due to surgery  MMT Right eval Left eval  Shoulder flexion    Shoulder extension    Shoulder abduction    Shoulder adduction    Shoulder internal rotation    Shoulder external rotation    Middle trapezius    Lower trapezius    Elbow flexion    Elbow extension    Wrist flexion    Wrist extension    Wrist ulnar deviation    Wrist radial deviation    Wrist pronation    Wrist supination    Grip strength (lbs)    (Blank rows = not tested)   PALPATION:  TTP in  ant and post shoulder, wrist extensors    [facility] Adult PT Treatment:                                                DATE: 10/18/2023 Therapeutic Exercise: Noodle scap retraction 5 sec hold x 10 Standing flexion to 90 deg x 10 Seated biceps curl 5# 2 x 15 Supine flexion to 90 deg with roller x 20 Standing scaption to 90 deg 2x 10 Neuromuscular re-ed: Standing ball on wall using red plyoball 2 direction Supine circles CW/CCW with 3# wt  (90 deg flexion) Supine Alphabet with 3# wt (90 deg flexion) Manual Therapy: PROM R elbow and supination/pronation  (supine) Radial AP mobs and IASTM to R wrist extensors Supine rhythmic stabilization at 90 - 125 deg  [facility] Adult PT Treatment:                                                DATE: 10/16/2023 Therapeutic Exercise: Noodle  scap retraction 5 sec hold x 10 Standing flexion to 90 deg x 10 Standing scaption to 90 deg x 10 Seated biceps curl 5# 2 x 10 Supine flexion to 90 deg with tband Elbow extension stretch in supine 3x30 sec with PT overpressure Neuromuscular re-ed: Standing ball on wall using red plyoball 2 direction Prone T and ext too difficult secondary to melanoma surgery Supine circles CW/CCW with 3# wt  (90 deg flexion) Supine Alphabet with 3# wt (90 deg flexion) Manual Therapy: PROM R elbow and supination/pronation  (supine) Supine rhythmic stabilization at 90 - deg Measurements taken   [facility] Adult PT Treatment:                                                DATE: 10/09/2023 Therapeutic Exercise: Pulleys shoulder flexion & scaption x 2 min each Elbow extension stretch in supine with 3# x 1 min then 5# x 1 min (passive stretch to -12 degrees) Seated biceps curl 5# 2 x 10 Neuromuscular re-ed: Prone T x 10 Prone ext x 3 -  held due to popping in shoulder; also attempted rows but popping persisted Supine circles CW/CCW with 3# wt  (90 deg flexion) Supine Alphabet with 3# wt (90 deg flexion) Reactive isometrics ER + RTB, green ext  10 each Manual Therapy: PROM R elbow and supination/pronation  (supine)    [facility] Adult PT Treatment:                                                DATE: 10/03/23 Therapeutic Exercise/Activity: Elbow flex/ext Pronation/supination Isometrics: ER, flexion, abd all 10 x 10 sec hold Manual Therapy: PROM Rt shoulder per protocol                                                                           PATIENT EDUCATION: Education details: PT eval findings, anticipated POC, initial HEP, and reviewed precautions and limitations  Person educated: Patient Education method: Explanation, Demonstration, and Handouts Education comprehension: verbalized understanding and returned demonstration  HOME EXERCISE PROGRAM: Access Code: [email or web address] URL: [email or web address] Date: 09/18/2023 Prepared by: Lowery Rue  Exercises - Seated Shoulder External Rotation AAROM with Cane and Hand in Neutral  - 1-2 x daily - 3 x weekly - 2 sets - 10 reps - Supine Shoulder Flexion Extension AAROM with Dowel  - 2 x daily - 7 x weekly - 1-3 sets - 10 reps - Wrist Flexion Extension AROM with Fingers Curled and Palm Down  - 2 x daily - 7 x weekly - 2 sets - 10 reps - Forearm Pronation and Supination With Arm Supported  - 2 x daily - 7 x weekly - 2 sets - 10 reps - Seated Scapular Retraction  - 1 x daily - 7 x weekly - 3 sets - 10 reps - Isometric Shoulder Abduction at Wall  - 1 x daily - 7 x weekly - 1 sets - 10 reps - 3 seconds  hold - Isometric Shoulder Extension at Wall  - 1 x daily - 7 x weekly - 1 sets - 10 reps - 3 seconds hold - Isometric Shoulder Flexion at Wall  - 1 x daily - 7 x weekly - 1 sets - 10 reps - 3 seconds hold  ASSESSMENT:  CLINICAL IMPRESSION: Patient demonstrated pain and trigger points in R wrist extensors today which was addressed with IASTM. He has some popping in the the shoulder with supine scaption which resolves with post glide by PT. Good tolerance to most exercises.  Standing scaption moved to supine due to UT compensation.   OBJECTIVE IMPAIRMENTS: decreased activity tolerance, decreased balance, decreased ROM, decreased strength, increased edema, increased muscle spasms, impaired flexibility, impaired sensation, impaired UE functional use, postural dysfunction, obesity, and pain.    GOALS: Goals reviewed with patient? Yes  SHORT TERM GOALS: Target date: 10/11/2023   Patient will be independent with initial HEP.  Baseline:  Goal status: IN PROGRESS  2. Balance screen performed and appropriate goals set if indicated.  Baseline:  10/01/23: TUG - 8 sec; 5xSTS - 12 sec Goal status: MET    LONG TERM GOALS: Target date: 11/08/2023   Patient will be independent with advanced/ongoing HEP to improve outcomes and carryover.  Baseline:  Goal status: INITIAL  2.   Decreased Quick Dash by >= 10 points demonstrating improved functional ability.  Baseline: 72.7 Goal status: INITIAL  3.  Patient to improve R shoulder AROM to [facility] without pain provocation to allow for increased ease of ADLs.  Baseline:  Goal status: INITIAL  4.  Patient will demonstrate improved UE strength to 4+/5 to perform ADLS  Baseline:  Goal status: INITIAL  5. Patient to report no pain in R forearm with ADLs.  Baseline:  Goal status: INITIAL  6.  Patient will score >19/24 on the DGI to decrease fall risk.   Baseline: TBD Goal status: INITIAL   PLAN:  PT FREQUENCY: 2x/week  PT DURATION: 8 weeks  PLANNED INTERVENTIONS: 97164- PT Re-evaluation, 97110-Therapeutic exercises, 97530- Therapeutic activity, 97112- Neuromuscular re-education, 97535- Self Care, [id number]- Manual therapy, [id number]- Electrical stimulation (unattended), 97016- Vasopneumatic device, 97033- Ionotophoresis 4mg /ml Dexamethasone , Patient/Family education, Balance training, Taping, Dry Needling, Joint mobilization, Spinal mobilization, Cryotherapy, and Moist heat  PLAN FOR NEXT SESSION: Note: Prone shoulder  strength affected by Melanoma surgery so limited in ability: Passive stretch wrist/forearm and elbow, progress scapular strength per protocol. P/AROM per protocol, deltoid isometrics. NO resisted shoulder ext/IR, don't push ER.  Pt able to lift up to 20# per MD note.   Jinx Mourning, PT 10/17/2509:03 AM

## 2023-10-18 ENCOUNTER — Ambulatory Visit: Admitting: Physical Therapy

## 2023-10-18 ENCOUNTER — Encounter: Payer: Self-pay | Admitting: Physical Therapy

## 2023-10-18 DIAGNOSIS — R600 Localized edema: Secondary | ICD-10-CM | POA: Diagnosis not present

## 2023-10-18 DIAGNOSIS — R2681 Unsteadiness on feet: Secondary | ICD-10-CM | POA: Diagnosis not present

## 2023-10-18 DIAGNOSIS — M6281 Muscle weakness (generalized): Secondary | ICD-10-CM | POA: Diagnosis not present

## 2023-10-18 DIAGNOSIS — M79601 Pain in right arm: Secondary | ICD-10-CM | POA: Diagnosis not present

## 2023-10-18 DIAGNOSIS — M25611 Stiffness of right shoulder, not elsewhere classified: Secondary | ICD-10-CM | POA: Diagnosis not present

## 2023-10-22 DIAGNOSIS — K219 Gastro-esophageal reflux disease without esophagitis: Secondary | ICD-10-CM | POA: Diagnosis not present

## 2023-10-22 DIAGNOSIS — Z800 Family history of malignant neoplasm of digestive organs: Secondary | ICD-10-CM | POA: Diagnosis not present

## 2023-10-22 DIAGNOSIS — K2270 Barrett's esophagus without dysplasia: Secondary | ICD-10-CM | POA: Diagnosis not present

## 2023-10-23 ENCOUNTER — Ambulatory Visit: Attending: Surgical

## 2023-10-23 DIAGNOSIS — R262 Difficulty in walking, not elsewhere classified: Secondary | ICD-10-CM | POA: Insufficient documentation

## 2023-10-23 DIAGNOSIS — M25611 Stiffness of right shoulder, not elsewhere classified: Secondary | ICD-10-CM | POA: Diagnosis not present

## 2023-10-23 DIAGNOSIS — M6281 Muscle weakness (generalized): Secondary | ICD-10-CM | POA: Insufficient documentation

## 2023-10-23 DIAGNOSIS — M79601 Pain in right arm: Secondary | ICD-10-CM | POA: Insufficient documentation

## 2023-10-23 DIAGNOSIS — R2681 Unsteadiness on feet: Secondary | ICD-10-CM | POA: Insufficient documentation

## 2023-10-23 DIAGNOSIS — R600 Localized edema: Secondary | ICD-10-CM | POA: Insufficient documentation

## 2023-10-23 NOTE — Therapy (Signed)
 OUTPATIENT PHYSICAL THERAPY SHOULDER TREATMENT   Patient Name: David [name] MRN: [id number] DOB:01-09-1951, 73 y.o., male Today's Date: 10/23/2023  END OF SESSION:  PT End of Session - 10/23/23 0758     Visit Number 10    Date for PT Re-Evaluation 11/08/23    Authorization Type [employer] MCR    Progress Note Due on Visit 10    PT Start Time 0800    PT Stop Time 0840    PT Time Calculation (min) 40 min    Activity Tolerance Patient tolerated treatment well    Behavior During Therapy [facility] for tasks assessed/performed            Past Medical History:  Diagnosis Date   Arthritis    Balance problem    Barrett's esophagus    BBB (bundle branch block) 03/15/2016   Carpal tunnel syndrome    DDD (degenerative disc disease), lumbar 03/15/2016   Depression    Dysrhythmia    had some tachycardia with steroids   Elevated cholesterol 03/15/2016   Headache(784.0)    Ocular Migraines - takes Ambien  for it   Heart murmur    HTN (hypertension) 03/15/2016   Hyperlipemia    Hypertension    Malignant melanoma of right side of neck (HCC) 03/15/2016   73 years old   Neuralgia 03/15/2016   Ocular migraine    Osteoarthritis    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    RLS (restless legs syndrome)    Sleep apnea    uses CPAP   Snores    Status post gastric banding    Wears glasses    driving   Past Surgical History:  Procedure Laterality Date   ANTERIOR CERVICAL DECOMPRESSION/DISCECTOMY FUSION 4 LEVELS N/A 02/26/2020   Procedure: ANTERIOR CERVICAL DECOMPRESSION/DISCECTOMY FUSION, INTERBODY PROSTHESIS, PLATE/SCREWS CERVICAL THREE-FOUR CERVICAL FOUR-FIVE CERVICAL FIVE-SIX- CERVICAL SIX-SEVEN;  Surgeon: Garry Kansas, MD;  Location: [facility];  Service: Neurosurgery;  Laterality: N/A;   CARPAL TUNNEL RELEASE Right 09/26/2012   Procedure: CARPAL TUNNEL RELEASE;  Surgeon: Milagros Alf, MD;  Location: [facility];  Service: Orthopedics;  Laterality: Right;   CARPAL TUNNEL RELEASE Left 10/24/2012   Procedure: CARPAL TUNNEL RELEASE;  Surgeon: Milagros Alf, MD;  Location: [facility];  Service: Orthopedics;  Laterality: Left;   CIRCUMCISION     as a baby   EXCISION MELANOMA WITH SENTINEL LYMPH NODE BIOPSY  1984   rt neck with mulpipal nodes and some muscle excised rt neck-shoulder   KNEE ARTHROPLASTY     knee replacement     LAPAROSCOPIC GASTRIC BANDING  03/15/2009   LAPAROSCOPIC GASTRIC BANDING     MUSCLE BIOPSY  2004   rt thigh to r/o myocitis   nerve conduction velosity test     REVERSE SHOULDER ARTHROPLASTY Right 08/21/2023   Procedure: ARTHROPLASTY, SHOULDER, TOTAL, REVERSE;  Surgeon: Jasmine Mesi, MD;  Location: [facility] OR;  Service: Orthopedics;  Laterality: Right;  RIGHT REVERSE SHOULDER ARTHROPLASTY   SHOULDER SURGERY Right 12/2021   TONSILLECTOMY     TOTAL KNEE ARTHROPLASTY Bilateral 12/05/2010   TRIGGER FINGER RELEASE Right 09/26/2012   Procedure: RELEASE TRIGGER FINGER/A-1 PULLEY RING AND SMALL ;  Surgeon: Milagros Alf, MD;  Location: [facility];  Service: Orthopedics;  Laterality: Right;   TRIGGER FINGER RELEASE Left 10/24/2012   Procedure: RELEASE TRIGGER FINGER/A-1 PULLEY LEFT MIDDLE AND LEFT RING;  Surgeon: Milagros Alf, MD;  Location: [facility];  Service: Orthopedics;  Laterality: Left;   UPPER GI ENDOSCOPY     Patient Active Problem List   Diagnosis Date Noted   Rotator cuff arthropathy, right 08/21/2023   S/P reverse total shoulder arthroplasty, right 08/21/2023   Sepsis due to undetermined organism (HCC) 08/20/2022   Cervical spondylosis with radiculopathy 02/26/2020   SBO (small bowel obstruction) (HCC) 08/21/2017   Trochanteric bursitis of left hip 01/18/2017   Lateral epicondylitis, right elbow 01/18/2017   Rheumatoid arthritis (HCC) 03/15/2016   DJD (degenerative joint disease), cervical 03/15/2016   DDD  (degenerative disc disease), lumbar 03/15/2016   HTN (hypertension) 03/15/2016   Obesity 03/15/2016   Elevated cholesterol 03/15/2016   Neuralgia 03/15/2016   Malignant melanoma of right side of neck (HCC) 03/15/2016   BBB (bundle branch block) 03/15/2016   High risk medication use 03/15/2016   Osteoarthritis of lumbar spine 03/15/2016   Primary osteoarthritis of both hands 03/15/2016   Primary osteoarthritis of both knees 03/15/2016   Mesenteric adenitis 06/29/2015    PCP: Stamey, Lowell Rude, FNP   REFERRING PROVIDER: Casilda Clayman, PA-C   REFERRING DIAG: [phone] (ICD-10-CM) - S/P reverse total shoulder arthroplasty, Right (With subscapularis repair)  THERAPY DIAG:  Stiffness of right shoulder, not elsewhere classified  Muscle weakness (generalized)  Unsteadiness on feet  Localized edema  Pain in right arm  Difficulty in walking, not elsewhere classified  Rationale for Evaluation and Treatment: Rehabilitation  ONSET DATE: 08/21/23 DOS  SUBJECTIVE:                                                                                                                                                                                      SUBJECTIVE STATEMENT: Patient reports he is scheduled to get an injection in Lt shoulder for pain prior to cruise trip later in June.  10/16/23: 8 weeks post op Hand dominance: Right  PERTINENT HISTORY: H/o falls,  ACDF, B TKA, HTN, h/o malignant melanoma in R neck affecting R trapezius muscle and R UE ROM  PAIN:  Are you having pain? No  PRECAUTIONS: Shoulder As of 10/03/23: Able to lift up to but not beyond 20#.  At Eval: Okay to very lightly lift with the operative extremity but no lifting anything heavier than a coffee cup or cell phone. Start physical therapy to focus on passive range of motion and active range of motion with deltoid isometrics. Do not want to externally rotate past 30 degrees to protect subscapularis repair.   RED  FLAGS: None   WEIGHT BEARING RESTRICTIONS: Yes R shoulder  FALLS:  Has patient fallen in  last 6 months? Yes. Number of falls 4 tripping over things in cluttered garage  LIVING ENVIRONMENT: Lives with: lives with their spouse Lives in: House/apartment Stairs: Yes: Internal: 15 steps; on left going up and bilateral but cannot reach both and External: 3 steps; no railing on deck Has following equipment at home: Single point cane and Crutches  OCCUPATION: retired  PLOF: Independent  PATIENT GOALS:to be able to sleep in bed on side without pain, currently on couch or in bed on back  NEXT MD VISIT: Early June [name])  OBJECTIVE:  Note: Objective measures were completed at Evaluation unless otherwise noted.  DIAGNOSTIC FINDINGS:  none  PATIENT SURVEYS:  Quick Dash  72.7 / 100 = 72.7 %  COGNITION: Overall cognitive status: Within functional limits for tasks assessed     SENSATION: Mild pull in post distal/lateral  wrist  POSTURE: Rounded shoulders  UPPER EXTREMITY ROM:   A/P ROM Right eval Left eval Right 5/8//25 Right 5/27  Shoulder flexion 130/142   150 active supine  Shoulder extension      Shoulder abduction 100 P   118 active  Shoulder adduction      Shoulder internal rotation    65  Shoulder external rotation 30/30   50  Elbow flexion      Elbow extension -23  -16 -11/0  Wrist flexion WFL     Wrist extension      Wrist ulnar deviation      Wrist radial deviation      Wrist pronation      Wrist supination      (Blank rows = not tested) R wrist limited since melanoma surgery  UPPER EXTREMITY MMT: NT due to surgery  MMT Right eval Left eval  Shoulder flexion    Shoulder extension    Shoulder abduction    Shoulder adduction    Shoulder internal rotation    Shoulder external rotation    Middle trapezius    Lower trapezius    Elbow flexion    Elbow extension    Wrist flexion    Wrist extension    Wrist ulnar deviation    Wrist radial deviation     Wrist pronation    Wrist supination    Grip strength (lbs)    (Blank rows = not tested)   PALPATION:  TTP in ant and post shoulder, wrist extensors    [facility] Adult PT Treatment:                                                DATE: 10/23/2023 Therapeutic Exercise: Standing with back against noodle for postural feedback: Scap retraction Shoulder flexion to 90 degrees Shoulder scaption to 90 degrees Rolling ball up/down wall for shoulder flexion stretch  Seated bicep curls (Rt) + 5#DB Neuromuscular re-ed: Reactive isometric rows step out/in + GTB Supine flexion to 90 deg with roller x 20 --> focus on scapula stability Supine scap punches + 3#DB (difficult) Standing scap punches with RTB & tactile cues Supine + 2#MB: Circles CW/CCW x15 each ABCs  Small crosses x20   [facility] Adult PT Treatment:  DATE: 10/18/2023 Therapeutic Exercise: Noodle scap retraction 5 sec hold x 10 Standing flexion to 90 deg x 10 Seated biceps curl 5# 2 x 15 Supine flexion to 90 deg with roller x 20 Standing scaption to 90 deg 2x 10 Neuromuscular re-ed: Standing ball on wall using red plyoball 2 direction Supine circles CW/CCW with 3# wt  (90 deg flexion) Supine Alphabet with 3# wt (90 deg flexion) Manual Therapy: PROM R elbow and supination/pronation  (supine) Radial AP mobs and IASTM to R wrist extensors Supine rhythmic stabilization at 90 - 125 deg   [facility] Adult PT Treatment:                                                DATE: 10/16/2023 Therapeutic Exercise: Noodle scap retraction 5 sec hold x 10 Standing flexion to 90 deg x 10 Standing scaption to 90 deg x 10 Seated biceps curl 5# 2 x 10 Supine flexion to 90 deg with tband Elbow extension stretch in supine 3x30 sec with PT overpressure Neuromuscular re-ed: Standing ball on wall using red plyoball 2 direction Prone T and ext too difficult secondary to melanoma surgery Supine circles CW/CCW with 3#  wt  (90 deg flexion) Supine Alphabet with 3# wt (90 deg flexion) Manual Therapy: PROM R elbow and supination/pronation  (supine) Supine rhythmic stabilization at 90 - deg Measurements taken                                                                         PATIENT EDUCATION: Education details: PT eval findings, anticipated POC, initial HEP, and reviewed precautions and limitations  Person educated: Patient Education method: Explanation, Demonstration, and Handouts Education comprehension: verbalized understanding and returned demonstration  HOME EXERCISE PROGRAM: Access Code: [email or web address] URL: [email or web address] Date: 09/18/2023 Prepared by: Lowery Rue  Exercises - Seated Shoulder External Rotation AAROM with Cane and Hand in Neutral  - 1-2 x daily - 3 x weekly - 2 sets - 10 reps - Supine Shoulder Flexion Extension AAROM with Dowel  - 2 x daily - 7 x weekly - 1-3 sets - 10 reps - Wrist Flexion Extension AROM with Fingers Curled and Palm Down  - 2 x daily - 7 x weekly - 2 sets - 10 reps - Forearm Pronation and Supination With Arm Supported  - 2 x daily - 7 x weekly - 2 sets - 10 reps - Seated Scapular Retraction  - 1 x daily - 7 x weekly - 3 sets - 10 reps - Isometric Shoulder Abduction at Wall  - 1 x daily - 7 x weekly - 1 sets - 10 reps - 3 seconds hold - Isometric Shoulder Extension at Wall  - 1 x daily - 7 x weekly - 1 sets - 10 reps - 3 seconds hold - Isometric Shoulder Flexion at Wall  - 1 x daily - 7 x weekly - 1 sets - 10 reps - 3 seconds hold  ASSESSMENT:  CLINICAL IMPRESSION:  Noted elevated scapula with arm raises; tactile cues provided to improve scapula stability, however patient unable to maintain cues. Postural and  shoulder strengthening exercises continued per protocol. Physical therapy continues to be indicated to address ongoing strength and stability deficits.   OBJECTIVE IMPAIRMENTS: decreased activity tolerance, decreased balance,  decreased ROM, decreased strength, increased edema, increased muscle spasms, impaired flexibility, impaired sensation, impaired UE functional use, postural dysfunction, obesity, and pain.    GOALS: Goals reviewed with patient? Yes  SHORT TERM GOALS: Target date: 10/11/2023   Patient will be independent with initial HEP.  Baseline:  Goal status: IN PROGRESS  2. Balance screen performed and appropriate goals set if indicated.  Baseline:  10/01/23: TUG - 8 sec; 5xSTS - 12 sec Goal status: MET    LONG TERM GOALS: Target date: 11/08/2023   Patient will be independent with advanced/ongoing HEP to improve outcomes and carryover.  Baseline:  Goal status: INITIAL  2.   Decreased Quick Dash by >= 10 points demonstrating improved functional ability.  Baseline: 72.7 Goal status: INITIAL  3.  Patient to improve R shoulder AROM to [facility] without pain provocation to allow for increased ease of ADLs.  Baseline:  Goal status: INITIAL  4.  Patient will demonstrate improved UE strength to 4+/5 to perform ADLS  Baseline:  Goal status: INITIAL  5. Patient to report no pain in R forearm with ADLs.  Baseline:  Goal status: INITIAL  6.  Patient will score >19/24 on the DGI to decrease fall risk.   Baseline: TBD Goal status: INITIAL   PLAN:  PT FREQUENCY: 2x/week  PT DURATION: 8 weeks  PLANNED INTERVENTIONS: 97164- PT Re-evaluation, 97110-Therapeutic exercises, 97530- Therapeutic activity, 97112- Neuromuscular re-education, 97535- Self Care, [id number]- Manual therapy, [id number]- Electrical stimulation (unattended), 97016- Vasopneumatic device, 97033- Ionotophoresis 4mg /ml Dexamethasone , Patient/Family education, Balance training, Taping, Dry Needling, Joint mobilization, Spinal mobilization, Cryotherapy, and Moist heat  PLAN FOR NEXT SESSION: Note: Prone shoulder strength affected by Melanoma surgery so limited in ability: Passive stretch wrist/forearm and elbow, progress scapular strength per  protocol. P/AROM per protocol, deltoid isometrics. NO resisted shoulder ext/IR, don't push ER.  Pt able to lift up to 20# per MD note.   Sims Duck, PTA 10/23/23, 8:43 AM

## 2023-10-26 ENCOUNTER — Ambulatory Visit

## 2023-10-26 DIAGNOSIS — R600 Localized edema: Secondary | ICD-10-CM

## 2023-10-26 DIAGNOSIS — R262 Difficulty in walking, not elsewhere classified: Secondary | ICD-10-CM | POA: Diagnosis not present

## 2023-10-26 DIAGNOSIS — M25611 Stiffness of right shoulder, not elsewhere classified: Secondary | ICD-10-CM | POA: Diagnosis not present

## 2023-10-26 DIAGNOSIS — M6281 Muscle weakness (generalized): Secondary | ICD-10-CM

## 2023-10-26 DIAGNOSIS — R2681 Unsteadiness on feet: Secondary | ICD-10-CM

## 2023-10-26 DIAGNOSIS — M79601 Pain in right arm: Secondary | ICD-10-CM

## 2023-10-26 NOTE — Therapy (Addendum)
 OUTPATIENT PHYSICAL THERAPY SHOULDER TREATMENT Progress Note Reporting Period 09/13/2023 to 10/26/2023  See note below for Objective Data and Assessment of Progress/Goals.       Patient Name: David [name] MRN: [id number] DOB:[birth year], 73 y.o., male Today's Date: 10/26/2023  END OF SESSION:  PT End of Session - 10/26/23 0851     Visit Number 11    Date for PT Re-Evaluation 11/08/23    Authorization Type [employer] MCR    PT Start Time 0850    PT Stop Time 0928    PT Time Calculation (min) 38 min    Activity Tolerance Patient tolerated treatment well    Behavior During Therapy [facility] for tasks assessed/performed            Past Medical History:  Diagnosis Date   Arthritis    Balance problem    Barrett's esophagus    BBB (bundle branch block) 03/15/2016   Carpal tunnel syndrome    DDD (degenerative disc disease), lumbar 03/15/2016   Depression    Dysrhythmia    had some tachycardia with steroids   Elevated cholesterol 03/15/2016   Headache(784.0)    Ocular Migraines - takes Ambien  for it   Heart murmur    HTN (hypertension) 03/15/2016   Hyperlipemia    Hypertension    Malignant melanoma of right side of neck (HCC) 03/15/2016   73 years old   Neuralgia 03/15/2016   Ocular migraine    Osteoarthritis    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    RLS (restless legs syndrome)    Sleep apnea    uses CPAP   Snores    Status post gastric banding    Wears glasses    driving   Past Surgical History:  Procedure Laterality Date   ANTERIOR CERVICAL DECOMPRESSION/DISCECTOMY FUSION 4 LEVELS N/A 02/26/2020   Procedure: ANTERIOR CERVICAL DECOMPRESSION/DISCECTOMY FUSION, INTERBODY PROSTHESIS, PLATE/SCREWS CERVICAL THREE-FOUR CERVICAL FOUR-FIVE CERVICAL FIVE-SIX- CERVICAL SIX-SEVEN;  Surgeon: Garry Kansas, MD;  Location: [facility] OR;  Service: Neurosurgery;  Laterality: N/A;   CARPAL TUNNEL RELEASE Right  09/26/2012   Procedure: CARPAL TUNNEL RELEASE;  Surgeon: Milagros Alf, MD;  Location: [facility];  Service: Orthopedics;  Laterality: Right;   CARPAL TUNNEL RELEASE Left 10/24/2012   Procedure: CARPAL TUNNEL RELEASE;  Surgeon: Milagros Alf, MD;  Location: [facility];  Service: Orthopedics;  Laterality: Left;   CIRCUMCISION     as a baby   EXCISION MELANOMA WITH SENTINEL LYMPH NODE BIOPSY  1984   rt neck with mulpipal nodes and some muscle excised rt neck-shoulder   KNEE ARTHROPLASTY     knee replacement     LAPAROSCOPIC GASTRIC BANDING  03/15/2009   LAPAROSCOPIC GASTRIC BANDING     MUSCLE BIOPSY  2004   rt thigh to r/o myocitis   nerve conduction velosity test     REVERSE SHOULDER ARTHROPLASTY Right 08/21/2023   Procedure: ARTHROPLASTY, SHOULDER, TOTAL, REVERSE;  Surgeon: Jasmine Mesi, MD;  Location: [facility] OR;  Service: Orthopedics;  Laterality: Right;  RIGHT REVERSE SHOULDER ARTHROPLASTY   SHOULDER SURGERY Right 12/2021   TONSILLECTOMY     TOTAL KNEE ARTHROPLASTY Bilateral 12/05/2010   TRIGGER FINGER RELEASE Right 09/26/2012   Procedure: RELEASE TRIGGER FINGER/A-1 PULLEY RING AND SMALL ;  Surgeon: Milagros Alf, MD;  Location:  SURGERY CENTER;  Service: Orthopedics;  Laterality: Right;   TRIGGER FINGER RELEASE Left  10/24/2012   Procedure: RELEASE TRIGGER FINGER/A-1 PULLEY LEFT MIDDLE AND LEFT RING;  Surgeon: Milagros Alf, MD;  Location: [facility];  Service: Orthopedics;  Laterality: Left;   UPPER GI ENDOSCOPY     Patient Active Problem List   Diagnosis Date Noted   Rotator cuff arthropathy, right 08/21/2023   S/P reverse total shoulder arthroplasty, right 08/21/2023   Sepsis due to undetermined organism (HCC) 08/20/2022   Cervical spondylosis with radiculopathy 02/26/2020   SBO (small bowel obstruction) (HCC) 08/21/2017   Trochanteric bursitis of left hip 01/18/2017   Lateral epicondylitis, right elbow 01/18/2017    Rheumatoid arthritis (HCC) 03/15/2016   DJD (degenerative joint disease), cervical 03/15/2016   DDD (degenerative disc disease), lumbar 03/15/2016   HTN (hypertension) 03/15/2016   Obesity 03/15/2016   Elevated cholesterol 03/15/2016   Neuralgia 03/15/2016   Malignant melanoma of right side of neck (HCC) 03/15/2016   BBB (bundle branch block) 03/15/2016   High risk medication use 03/15/2016   Osteoarthritis of lumbar spine 03/15/2016   Primary osteoarthritis of both hands 03/15/2016   Primary osteoarthritis of both knees 03/15/2016   Mesenteric adenitis 06/29/2015    PCP: Stamey, Lowell Rude, FNP   REFERRING PROVIDER: Casilda Clayman, PA-C   REFERRING DIAG: [phone] (ICD-10-CM) - S/P reverse total shoulder arthroplasty, Right (With subscapularis repair)  THERAPY DIAG:  Stiffness of right shoulder, not elsewhere classified  Muscle weakness (generalized)  Unsteadiness on feet  Localized edema  Pain in right arm  Rationale for Evaluation and Treatment: Rehabilitation  ONSET DATE: 08/21/23 DOS  SUBJECTIVE:                                                                                                                                                                                      SUBJECTIVE STATEMENT: Patient reports he is tired today due to not sleeping well last night. Patient reports no pain in shoulder, states he has fatigue in shoulder when doing HEP  10/16/23: 8 weeks post op Hand dominance: Right  PERTINENT HISTORY: H/o falls,  ACDF, B TKA, HTN, h/o malignant melanoma in R neck affecting R trapezius muscle and R UE ROM  PAIN:  Are you having pain? No  PRECAUTIONS: Shoulder As of 10/03/23: Able to lift up to but not beyond 20#.  At Eval: Okay to very lightly lift with the operative extremity but no lifting anything heavier than a coffee cup or cell phone. Start physical therapy to focus on passive range of motion and active range of motion with deltoid  isometrics. Do not want to externally rotate past 30 degrees to protect subscapularis repair.   RED FLAGS: None  WEIGHT BEARING RESTRICTIONS: Yes R shoulder  FALLS:  Has patient fallen in last 6 months? Yes. Number of falls 4 tripping over things in cluttered garage  LIVING ENVIRONMENT: Lives with: lives with their spouse Lives in: House/apartment Stairs: Yes: Internal: 15 steps; on left going up and bilateral but cannot reach both and External: 3 steps; no railing on deck Has following equipment at home: Single point cane and Crutches  OCCUPATION: retired  PLOF: Independent  PATIENT GOALS:to be able to sleep in bed on side without pain, currently on couch or in bed on back  NEXT MD VISIT: Early June [name])  OBJECTIVE:  Note: Objective measures were completed at Evaluation unless otherwise noted.  DIAGNOSTIC FINDINGS:  none  PATIENT SURVEYS:  Quick Dash  72.7 / 100 = 72.7 %  COGNITION: Overall cognitive status: Within functional limits for tasks assessed     SENSATION: Mild pull in post distal/lateral  wrist  POSTURE: Rounded shoulders  UPPER EXTREMITY ROM:   A/P ROM Right eval Left eval Right 5/8//25 Right 5/27 Right 10/26/23  Shoulder flexion 130/142   150 active supine 70 active supine  Shoulder extension       Shoulder abduction 100 P   118 active 111 active supine  Shoulder adduction       Shoulder internal rotation    65   Shoulder external rotation 30/30   50   Elbow flexion       Elbow extension -23  -16 -11/0 -2 AAROM  Wrist flexion WFL      Wrist extension       Wrist ulnar deviation       Wrist radial deviation       Wrist pronation       Wrist supination       (Blank rows = not tested) R wrist limited since melanoma surgery  UPPER EXTREMITY MMT: NT due to surgery  MMT Right eval Left eval  Shoulder flexion    Shoulder extension    Shoulder abduction    Shoulder adduction    Shoulder internal rotation    Shoulder external rotation     Middle trapezius    Lower trapezius    Elbow flexion    Elbow extension    Wrist flexion    Wrist extension    Wrist ulnar deviation    Wrist radial deviation    Wrist pronation    Wrist supination    Grip strength (lbs)    (Blank rows = not tested)   PALPATION:  TTP in ant and post shoulder, wrist extensors    [facility] Adult PT Treatment:                                                DATE: 10/26/2023 Therapeutic Exercise: Seated with noodle behind back: Scap squeezes + Quick Dash intake Standing with back against noodle for postural feedback: Shoulder flexion to 90 degrees Shoulder scaption to 90 degrees Rolling ball up/down wall for shoulder flexion stretch  Seated bicep curls (Rt) + 5#DB Shoulder/elbow measurements (see above) Manual Therapy: PROM R elbow and supination/pronation (supine) Neuromuscular re-ed: Supine:  Scap punches + 3#dowel Shoulder flexion + 2#dowel to 90 degrees Supine + 2#MB: Small crosses x20 Circles CW/CCW x15 each Alphabet Reactive isometric rows step out/in + GTB   [facility] Adult PT Treatment:  DATE: 10/23/2023 Therapeutic Exercise: Standing with back against noodle for postural feedback: Scap retraction Shoulder flexion to 90 degrees Shoulder scaption to 90 degrees Rolling ball up/down wall for shoulder flexion stretch  Seated bicep curls (Rt) + 5#DB Neuromuscular re-ed: Reactive isometric rows step out/in + GTB Supine flexion to 90 deg with roller x 20 --> focus on scapula stability Supine scap punches + 3#DB (difficult) Standing scap punches with RTB & tactile cues Supine + 2#MB: Circles CW/CCW x15 each ABCs  Small crosses x20   [facility] Adult PT Treatment:                                                DATE: 10/18/2023 Therapeutic Exercise: Noodle scap retraction 5 sec hold x 10 Standing flexion to 90 deg x 10 Seated biceps curl 5# 2 x 15 Supine flexion to 90 deg with roller x  20 Standing scaption to 90 deg 2x 10 Neuromuscular re-ed: Standing ball on wall using red plyoball 2 direction Supine circles CW/CCW with 3# wt  (90 deg flexion) Supine Alphabet with 3# wt (90 deg flexion) Manual Therapy: PROM R elbow and supination/pronation  (supine) Radial AP mobs and IASTM to R wrist extensors Supine rhythmic stabilization at 90 - 125 deg                                                                         PATIENT EDUCATION: Education details: PT eval findings, anticipated POC, initial HEP, and reviewed precautions and limitations  Person educated: Patient Education method: Explanation, Demonstration, and Handouts Education comprehension: verbalized understanding and returned demonstration  HOME EXERCISE PROGRAM: Access Code: [email or web address] URL: [email or web address] Date: 09/18/2023 Prepared by: Lowery Rue  Exercises - Seated Shoulder External Rotation AAROM with Cane and Hand in Neutral  - 1-2 x daily - 3 x weekly - 2 sets - 10 reps - Supine Shoulder Flexion Extension AAROM with Dowel  - 2 x daily - 7 x weekly - 1-3 sets - 10 reps - Wrist Flexion Extension AROM with Fingers Curled and Palm Down  - 2 x daily - 7 x weekly - 2 sets - 10 reps - Forearm Pronation and Supination With Arm Supported  - 2 x daily - 7 x weekly - 2 sets - 10 reps - Seated Scapular Retraction  - 1 x daily - 7 x weekly - 3 sets - 10 reps - Isometric Shoulder Abduction at Wall  - 1 x daily - 7 x weekly - 1 sets - 10 reps - 3 seconds hold - Isometric Shoulder Extension at Wall  - 1 x daily - 7 x weekly - 1 sets - 10 reps - 3 seconds hold - Isometric Shoulder Flexion at Wall  - 1 x daily - 7 x weekly - 1 sets - 10 reps - 3 seconds hold  ASSESSMENT:  CLINICAL IMPRESSION:  Patient is making steady progress with shoulder flexion AROM; improved elbow active extension measured with two degrees lacking from full extension. Patient continues to demonstrate elevated shoulder and  poor scapula mobility with shoulder mobility; frequent tactile cueing provided but  patient has difficulty maintaining form. Patient will continue to benefit from skilled therapy to address ongoing strength and stability deficits, as well as improve body mechanics and postural awareness.    OBJECTIVE IMPAIRMENTS: decreased activity tolerance, decreased balance, decreased ROM, decreased strength, increased edema, increased muscle spasms, impaired flexibility, impaired sensation, impaired UE functional use, postural dysfunction, obesity, and pain.    GOALS: Goals reviewed with patient? Yes  SHORT TERM GOALS: Target date: 10/11/2023   Patient will be independent with initial HEP.  Baseline:  Goal status: IN PROGRESS  2. Balance screen performed and appropriate goals set if indicated.  Baseline:  10/01/23: TUG - 8 sec; 5xSTS - 12 sec Goal status: MET    LONG TERM GOALS: Target date: 11/08/2023   Patient will be independent with advanced/ongoing HEP to improve outcomes and carryover.  Baseline:  Goal status: INITIAL  2.   Decreased Quick Dash by >= 10 points demonstrating improved functional ability.  Baseline: 72.7 10/26/23: 25.0 Goal status: INITIAL  3.  Patient to improve R shoulder AROM to [facility] without pain provocation to allow for increased ease of ADLs.  Baseline:  Goal status: INITIAL  4.  Patient will demonstrate improved UE strength to 4+/5 to perform ADLS  Baseline:  Goal status: INITIAL  5. Patient to report no pain in R forearm with ADLs.  Baseline:  Goal status: INITIAL  6.  Patient will score >19/24 on the DGI to decrease fall risk.   Baseline: TBD Goal status: INITIAL   PLAN:  PT FREQUENCY: 2x/week  PT DURATION: 8 weeks  PLANNED INTERVENTIONS: 97164- PT Re-evaluation, 97110-Therapeutic exercises, 97530- Therapeutic activity, 97112- Neuromuscular re-education, 97535- Self Care, 16109- Manual therapy, [id number]- Electrical stimulation (unattended), 97016-  Vasopneumatic device, 97033- Ionotophoresis 4mg /ml Dexamethasone , Patient/Family education, Balance training, Taping, Dry Needling, Joint mobilization, Spinal mobilization, Cryotherapy, and Moist heat  PLAN FOR NEXT SESSION: Note: Prone shoulder strength affected by Melanoma surgery so limited in ability: Passive stretch wrist/forearm and elbow, progress scapular strength per protocol. P/AROM per protocol, deltoid isometrics. NO resisted shoulder ext/IR, don't push ER.  Pt able to lift up to 20# per MD note.   Sims Duck, PTA 10/26/23, 9:28 AM

## 2023-10-27 NOTE — Progress Notes (Signed)
 Office Visit Note  Patient: David [name]             Date of Birth: 10-14-1950           MRN: [id number]             PCP: Lorenza Romans, FNP Referring: Lorenza Romans, FNP Visit Date: 11/08/2023 Occupation: @GUAROCC @  Subjective:  Medication management  History of Present Illness: David [name] is a 73 y.o. male with seronegative rheumatoid arthritis and osteoarthritis.  He returns today after his last visit on May 28, 2023.  He underwent right total reverse shoulder replacement by Dr. [name] on August 21, 2023.  He is going through physical therapy.  He stopped medications prior to the surgery and resumed everything 2 weeks later.  He is on methotrexate  1 mL subcu weekly along with folic acid  to 1 mg p.o. daily and Plaquenil  200 mg p.o. twice daily.  Sulfasalazine  500 mg, 2 tablets p.o. twice daily.  He did not develop any flares while he was off medications.  He states he will need left total shoulder replacement sometimes this winter.  He continues to have discomfort in his left shoulder joint.  He has intermittent discomfort in his left knee which has been replaced in the past.  None of the other joints are painful or swollen.    Activities of Daily Living:  Patient reports morning stiffness for 1 hour.   Patient Denies nocturnal pain.  Difficulty dressing/grooming: Denies Difficulty climbing stairs: Denies Difficulty getting out of chair: Denies Difficulty using hands for taps, buttons, cutlery, and/or writing: Denies  Review of Systems  Constitutional:  Negative for fatigue.  HENT:  Positive for mouth dryness. Negative for mouth sores.   Eyes:  Positive for dryness.  Respiratory:  Negative for shortness of breath.   Cardiovascular:  Negative for chest pain and palpitations.  Gastrointestinal:  Negative for blood in stool, constipation and diarrhea.  Endocrine: Negative for increased urination.  Genitourinary:  Negative for involuntary urination.  Musculoskeletal:   Positive for joint pain, gait problem, joint pain and morning stiffness. Negative for joint swelling, myalgias, muscle weakness, muscle tenderness and myalgias.  Skin:  Positive for hair loss and sensitivity to sunlight. Negative for color change and rash.  Allergic/Immunologic: Negative for susceptible to infections.  Neurological:  Negative for dizziness and headaches.  Hematological:  Negative for swollen glands.  Psychiatric/Behavioral:  Positive for sleep disturbance. Negative for depressed mood. The patient is not nervous/anxious.     PMFS History:  Patient Active Problem List   Diagnosis Date Noted   Rotator cuff arthropathy, right 08/21/2023   S/P reverse total shoulder arthroplasty, right 08/21/2023   Sepsis due to undetermined organism (HCC) 08/20/2022   Cervical spondylosis with radiculopathy 02/26/2020   SBO (small bowel obstruction) (HCC) 08/21/2017   Trochanteric bursitis of left hip 01/18/2017   Lateral epicondylitis, right elbow 01/18/2017   Rheumatoid arthritis (HCC) 03/15/2016   DJD (degenerative joint disease), cervical 03/15/2016   DDD (degenerative disc disease), lumbar 03/15/2016   HTN (hypertension) 03/15/2016   Obesity 03/15/2016   Elevated cholesterol 03/15/2016   Neuralgia 03/15/2016   Malignant melanoma of right side of neck (HCC) 03/15/2016   BBB (bundle branch block) 03/15/2016   High risk medication use 03/15/2016   Osteoarthritis of lumbar spine 03/15/2016   Primary osteoarthritis of both hands 03/15/2016   Primary osteoarthritis of both knees 03/15/2016   Mesenteric adenitis 06/29/2015    Past Medical History:  Diagnosis  Date   Arthritis    Balance problem    Barrett's esophagus    BBB (bundle branch block) 03/15/2016   Carpal tunnel syndrome    DDD (degenerative disc disease), lumbar 03/15/2016   Depression    Dysrhythmia    had some tachycardia with steroids   Elevated cholesterol 03/15/2016   Headache(784.0)    Ocular Migraines - takes  Ambien  for it   Heart murmur    HTN (hypertension) 03/15/2016   Hyperlipemia    Hypertension    Malignant melanoma of right side of neck (HCC) 03/15/2016   73 years old   Neuralgia 03/15/2016   Ocular migraine    Osteoarthritis    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    RLS (restless legs syndrome)    Sleep apnea    uses CPAP   Snores    Status post gastric banding    Wears glasses    driving    Family History  Problem Relation Age of Onset   Heart disease Mother    Alzheimer's disease Father    Past Surgical History:  Procedure Laterality Date   ANTERIOR CERVICAL DECOMPRESSION/DISCECTOMY FUSION 4 LEVELS N/A 02/26/2020   Procedure: ANTERIOR CERVICAL DECOMPRESSION/DISCECTOMY FUSION, INTERBODY PROSTHESIS, PLATE/SCREWS CERVICAL THREE-FOUR CERVICAL FOUR-FIVE CERVICAL FIVE-SIX- CERVICAL SIX-SEVEN;  Surgeon: Garry Kansas, MD;  Location: [facility] OR;  Service: Neurosurgery;  Laterality: N/A;   BASAL CELL CARCINOMA EXCISION Right    right temple area   CARPAL TUNNEL RELEASE Right 09/26/2012   Procedure: CARPAL TUNNEL RELEASE;  Surgeon: Milagros Alf, MD;  Location: [facility];  Service: Orthopedics;  Laterality: Right;   CARPAL TUNNEL RELEASE Left 10/24/2012   Procedure: CARPAL TUNNEL RELEASE;  Surgeon: Milagros Alf, MD;  Location: [facility];  Service: Orthopedics;  Laterality: Left;   CIRCUMCISION     as a baby   EXCISION MELANOMA WITH SENTINEL LYMPH NODE BIOPSY  1984   rt neck with mulpipal nodes and some muscle excised rt neck-shoulder   KNEE ARTHROPLASTY     knee replacement     LAPAROSCOPIC GASTRIC BANDING  03/15/2009   LAPAROSCOPIC GASTRIC BANDING     MUSCLE BIOPSY  2004   rt thigh to r/o myocitis   nerve conduction velosity test     REVERSE SHOULDER ARTHROPLASTY Right 08/21/2023   Procedure: ARTHROPLASTY, SHOULDER, TOTAL, REVERSE;  Surgeon: Jasmine Mesi, MD;   Location: [facility] OR;  Service: Orthopedics;  Laterality: Right;  RIGHT REVERSE SHOULDER ARTHROPLASTY   SHOULDER SURGERY Right 12/2021   TONSILLECTOMY     TOTAL KNEE ARTHROPLASTY Bilateral 12/05/2010   TRIGGER FINGER RELEASE Right 09/26/2012   Procedure: RELEASE TRIGGER FINGER/A-1 PULLEY RING AND SMALL ;  Surgeon: Milagros Alf, MD;  Location: [facility];  Service: Orthopedics;  Laterality: Right;   TRIGGER FINGER RELEASE Left 10/24/2012   Procedure: RELEASE TRIGGER FINGER/A-1 PULLEY LEFT MIDDLE AND LEFT RING;  Surgeon: Milagros Alf, MD;  Location: [facility];  Service: Orthopedics;  Laterality: Left;   UPPER GI ENDOSCOPY     Social History   Social History Narrative   1 step daughter   Immunization History  Administered Date(s) Administered   Moderna Sars-Covid-2 Vaccination 06/10/2019, 07/08/2019   PFIZER(Purple Top)SARS-COV-2 Vaccination 05/21/2020     Objective: Vital Signs: BP (!) 101/59 (BP Location: Left Arm, Patient Position: Sitting, Cuff Size: Normal)   Pulse 65  Resp 16   Ht 5' 8 (1.727 m)   Wt 251 lb 3.2 oz (113.9 kg)   BMI 38.19 kg/m    Physical Exam Vitals and nursing note reviewed.  Constitutional:      Appearance: He is well-developed.  HENT:     Head: Normocephalic and atraumatic.   Eyes:     Conjunctiva/sclera: Conjunctivae normal.     Pupils: Pupils are equal, round, and reactive to light.    Cardiovascular:     Rate and Rhythm: Normal rate and regular rhythm.     Heart sounds: Normal heart sounds.  Pulmonary:     Effort: Pulmonary effort is normal.     Breath sounds: Normal breath sounds.  Abdominal:     General: Bowel sounds are normal.     Palpations: Abdomen is soft.   Musculoskeletal:     Cervical back: Normal range of motion and neck supple.   Skin:    General: Skin is warm and dry.     Capillary Refill: Capillary refill takes less than 2 seconds.   Neurological:     Mental Status: He is alert and  oriented to person, place, and time.   Psychiatric:        Behavior: Behavior normal.      Musculoskeletal Exam: Patient had limited lateral rotation of the cervical spine.  He has some discomfort with range of motion of the lumbar spine.  Right shoulder joint forward flexion was about 60 degrees and abduction about 50 degrees.  He had limited internal rotation.  Left shoulder joint was in full range of motion with some discomfort.  He had about 5 degree contracture in his right elbow.  Left elbow joint was in good range of motion.  Bilateral PIP and DIP thickening with no synovitis was noted.  Hip joints in good range of motion.  Bilateral knee joints were replaced without any warmth swelling or effusion.  There was no synovitis over ankles or MTPs.  CDAI Exam: CDAI Score: -- Patient Global: --; Provider Global: -- Swollen: --; Tender: -- Joint Exam 11/08/2023   No joint exam has been documented for this visit   There is currently no information documented on the homunculus. Go to the Rheumatology activity and complete the homunculus joint exam.  Investigation: No additional findings.  Imaging: No results found.  Recent Labs: Lab Results  Component Value Date   WBC 7.2 09/05/2023   HGB 13.0 (L) 09/05/2023   PLT 353 09/05/2023   NA 138 09/05/2023   K 4.3 09/05/2023   CL 103 09/05/2023   CO2 28 09/05/2023   GLUCOSE 151 (H) 09/05/2023   BUN 20 09/05/2023   CREATININE 0.87 09/05/2023   BILITOT 0.4 09/05/2023   ALKPHOS 52 08/20/2022   AST 22 09/05/2023   ALT 21 09/05/2023   PROT 6.6 09/05/2023   ALBUMIN  3.1 (L) 08/20/2022   CALCIUM 9.6 09/05/2023   GFRAA 91 11/12/2020    Speciality Comments: PLQ Eye Exam: 11/10/2022 WNL @ [employer] [facility]. Follow up in 1 month for a Visual [employer].   Called to obtain visual field (12/27/22)-MG  Procedures:  No procedures performed Allergies: Oxycodone -acetaminophen , Codeine, Dilaudid  [hydromorphone  hcl], Fentanyl , Nucynta  [tapentadol], and Penicillins   Assessment / Plan:     Visit Diagnoses: Rheumatoid arthritis of multiple sites with negative rheumatoid factor (HCC)-patient denies having a flare of rheumatoid arthritis.  He was off methotrexate , Plaquenil  and sulfasalazine  for about 3 weeks for right reverse total shoulder replacement on  April 1.  He resumed medications 2 weeks after the surgery.  He continues to have some discomfort in his bilateral shoulders.  He is going to physical therapy.  High risk medication use - Methotrexate  1.0 ml sq injections once weekly, folic acid  2 mg daily, plaquenil  200 mg 1 tablet twice daily, and sulfasalazine  500 mg 2 tablets po BID. September 05, 2023 CBC and CMP were normal.  Plaquenil  eye examination November 18, 2022 was normal.  Patient was advised to have annual eye examination.  Labs were advised every 3 months.  Information reimmunization was placed in the AVS.  She was advised to hold medications if he develops an infection resume of the infection resolves.  Status post reverse total shoulder replacement, right -August 21, 2023 by Dr. [name].  He had limited range of motion of his right shoulder joint.  He is going to physical therapy and is gradually recovering.  He states the pain is improved after the surgery.  He continues to have some discomfort in his left shoulder joint .  Contracture of right elbow-he has developed contracture in his right elbow after the surgery most likely due to being in a sling.  He is going to physical therapy.  Primary osteoarthritis of both hands-Ro PIP and DIP thickening.  No synovitis was noted on the examination.  Hx of total knee replacement, bilateral - Dr. Bernard Brick.  He has been having intermittent discomfort in his left knee joint.  No warmth swelling or effusion was noted.  I advised him to discuss this further with Dr. [name].  DDD (degenerative disc disease), cervical - S/p fusion in 2021.  He had limited lateral rotation without any  discomfort.  Degeneration of intervertebral disc of lumbar region without discogenic back pain or lower extremity pain-he continues to have intermittent discomfort in his lower back.  Elevated CK - CK 453 on 08/20/22. CK improved to 286 on 12/27/22.  No muscular weakness at this time.  History of melanoma-status post resection  History of sepsis  Impetigo  History of obesity  Orders: No orders of the defined types were placed in this encounter.  No orders of the defined types were placed in this encounter.    Follow-Up Instructions: Return in about 5 months (around 04/09/2024) for Rheumatoid arthritis, Osteoarthritis.   Nicholas Bari, MD  Note - This record has been created using Animal nutritionist.  Chart creation errors have been sought, but may not always  have been located. Such creation errors do not reflect on  the standard of medical care.

## 2023-10-29 ENCOUNTER — Other Ambulatory Visit: Payer: Self-pay | Admitting: Surgical

## 2023-10-29 NOTE — Therapy (Signed)
 OUTPATIENT PHYSICAL THERAPY SHOULDER TREATMENT     Patient Name: David [name] MRN: [id number] DOB:1950-08-16, 73 y.o., male Today's Date: 10/29/2023  END OF SESSION:   Past Medical History:  Diagnosis Date   Arthritis    Balance problem    Barrett's esophagus    BBB (bundle branch block) 03/15/2016   Carpal tunnel syndrome    DDD (degenerative disc disease), lumbar 03/15/2016   Depression    Dysrhythmia    had some tachycardia with steroids   Elevated cholesterol 03/15/2016   Headache(784.0)    Ocular Migraines - takes Ambien  for it   Heart murmur    HTN (hypertension) 03/15/2016   Hyperlipemia    Hypertension    Malignant melanoma of right side of neck (HCC) 03/15/2016   73 years old   Neuralgia 03/15/2016   Ocular migraine    Osteoarthritis    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    RLS (restless legs syndrome)    Sleep apnea    uses CPAP   Snores    Status post gastric banding    Wears glasses    driving   Past Surgical History:  Procedure Laterality Date   ANTERIOR CERVICAL DECOMPRESSION/DISCECTOMY FUSION 4 LEVELS N/A 02/26/2020   Procedure: ANTERIOR CERVICAL DECOMPRESSION/DISCECTOMY FUSION, INTERBODY PROSTHESIS, PLATE/SCREWS CERVICAL THREE-FOUR CERVICAL FOUR-FIVE CERVICAL FIVE-SIX- CERVICAL SIX-SEVEN;  Surgeon: Garry Kansas, MD;  Location: [facility] OR;  Service: Neurosurgery;  Laterality: N/A;   CARPAL TUNNEL RELEASE Right 09/26/2012   Procedure: CARPAL TUNNEL RELEASE;  Surgeon: Milagros Alf, MD;  Location: [facility];  Service: Orthopedics;  Laterality: Right;   CARPAL TUNNEL RELEASE Left 10/24/2012   Procedure: CARPAL TUNNEL RELEASE;  Surgeon: Milagros Alf, MD;  Location: [facility];  Service: Orthopedics;  Laterality: Left;   CIRCUMCISION     as a baby   EXCISION MELANOMA WITH SENTINEL LYMPH NODE BIOPSY  1984   rt neck with mulpipal nodes and some  muscle excised rt neck-shoulder   KNEE ARTHROPLASTY     knee replacement     LAPAROSCOPIC GASTRIC BANDING  03/15/2009   LAPAROSCOPIC GASTRIC BANDING     MUSCLE BIOPSY  2004   rt thigh to r/o myocitis   nerve conduction velosity test     REVERSE SHOULDER ARTHROPLASTY Right 08/21/2023   Procedure: ARTHROPLASTY, SHOULDER, TOTAL, REVERSE;  Surgeon: Jasmine Mesi, MD;  Location: [facility] OR;  Service: Orthopedics;  Laterality: Right;  RIGHT REVERSE SHOULDER ARTHROPLASTY   SHOULDER SURGERY Right 12/2021   TONSILLECTOMY     TOTAL KNEE ARTHROPLASTY Bilateral 12/05/2010   TRIGGER FINGER RELEASE Right 09/26/2012   Procedure: RELEASE TRIGGER FINGER/A-1 PULLEY RING AND SMALL ;  Surgeon: Milagros Alf, MD;  Location: [facility];  Service: Orthopedics;  Laterality: Right;   TRIGGER FINGER RELEASE Left 10/24/2012   Procedure: RELEASE TRIGGER FINGER/A-1 PULLEY LEFT MIDDLE AND LEFT RING;  Surgeon: Milagros Alf, MD;  Location: [facility];  Service: Orthopedics;  Laterality: Left;   UPPER GI ENDOSCOPY     Patient Active Problem List   Diagnosis Date Noted   Rotator cuff arthropathy, right 08/21/2023   S/P reverse total shoulder arthroplasty, right 08/21/2023   Sepsis due to undetermined organism (HCC) 08/20/2022   Cervical spondylosis with radiculopathy 02/26/2020   SBO (small bowel obstruction) (HCC) 08/21/2017   Trochanteric bursitis of left hip 01/18/2017   Lateral epicondylitis,  right elbow 01/18/2017   Rheumatoid arthritis (HCC) 03/15/2016   DJD (degenerative joint disease), cervical 03/15/2016   DDD (degenerative disc disease), lumbar 03/15/2016   HTN (hypertension) 03/15/2016   Obesity 03/15/2016   Elevated cholesterol 03/15/2016   Neuralgia 03/15/2016   Malignant melanoma of right side of neck (HCC) 03/15/2016   BBB (bundle branch block) 03/15/2016   High risk medication use 03/15/2016   Osteoarthritis of lumbar spine 03/15/2016   Primary osteoarthritis of  both hands 03/15/2016   Primary osteoarthritis of both knees 03/15/2016   Mesenteric adenitis 06/29/2015    PCP: Stamey, Lowell Rude, FNP   REFERRING PROVIDER: Casilda Clayman, PA-C   REFERRING DIAG: [phone] (ICD-10-CM) - S/P reverse total shoulder arthroplasty, Right (With subscapularis repair)  THERAPY DIAG:  No diagnosis found.  Rationale for Evaluation and Treatment: Rehabilitation  ONSET DATE: 08/21/23 DOS  SUBJECTIVE:                                                                                                                                                                                      SUBJECTIVE STATEMENT: ***  10/16/23: 8 weeks post op Hand dominance: Right  PERTINENT HISTORY: H/o falls,  ACDF, B TKA, HTN, h/o malignant melanoma in R neck affecting R trapezius muscle and R UE ROM  PAIN:  Are you having pain? No  PRECAUTIONS: Shoulder As of 10/03/23: Able to lift up to but not beyond 20#.  At Eval: Okay to very lightly lift with the operative extremity but no lifting anything heavier than a coffee cup or cell phone. Start physical therapy to focus on passive range of motion and active range of motion with deltoid isometrics. Do not want to externally rotate past 30 degrees to protect subscapularis repair.   RED FLAGS: None   WEIGHT BEARING RESTRICTIONS: Yes R shoulder  FALLS:  Has patient fallen in last 6 months? Yes. Number of falls 4 tripping over things in cluttered garage  LIVING ENVIRONMENT: Lives with: lives with their spouse Lives in: House/apartment Stairs: Yes: Internal: 15 steps; on left going up and bilateral but cannot reach both and External: 3 steps; no railing on deck Has following equipment at home: Single point cane and Crutches  OCCUPATION: retired  PLOF: Independent  PATIENT GOALS:to be able to sleep in bed on side without pain, currently on couch or in bed on back  NEXT MD VISIT: Early June [name])  OBJECTIVE:  Note:  Objective measures were completed at Evaluation unless otherwise noted.  DIAGNOSTIC FINDINGS:  none  PATIENT SURVEYS:  Quick Dash  72.7 / 100 = 72.7 %  COGNITION: Overall cognitive status: Within functional limits for tasks  assessed     SENSATION: Mild pull in post distal/lateral  wrist  POSTURE: Rounded shoulders  UPPER EXTREMITY ROM:   A/P ROM Right eval Left eval Right 5/8//25 Right 5/27 Right 10/26/23  Shoulder flexion 130/142   150 active supine 70 active supine  Shoulder extension       Shoulder abduction 100 P   118 active 111 active supine  Shoulder adduction       Shoulder internal rotation    65   Shoulder external rotation 30/30   50   Elbow flexion       Elbow extension -23  -16 -11/0 -2 AAROM  Wrist flexion WFL      Wrist extension       Wrist ulnar deviation       Wrist radial deviation       Wrist pronation       Wrist supination       (Blank rows = not tested) R wrist limited since melanoma surgery  UPPER EXTREMITY MMT: NT due to surgery  MMT Right eval Left eval  Shoulder flexion    Shoulder extension    Shoulder abduction    Shoulder adduction    Shoulder internal rotation    Shoulder external rotation    Middle trapezius    Lower trapezius    Elbow flexion    Elbow extension    Wrist flexion    Wrist extension    Wrist ulnar deviation    Wrist radial deviation    Wrist pronation    Wrist supination    Grip strength (lbs)    (Blank rows = not tested)   PALPATION:  TTP in ant and post shoulder, wrist extensors    [facility] Adult PT Treatment:                                                DATE: 10/30/2023 Therapeutic Exercise: Seated with noodle behind back: Scap squeezes Standing with back against noodle for postural feedback: Shoulder flexion to 90 degrees Shoulder scaption to 90 degrees Rolling ball up/down wall for shoulder flexion stretch  Seated bicep curls (Rt) + 5#DB Manual Therapy: PROM R elbow and supination/pronation  (supine) Neuromuscular re-ed: Supine:  Scap punches + 3#dowel Shoulder flexion + 2#dowel to 90 degrees Supine + 2#MB: Small crosses x20 Circles CW/CCW x15 each Alphabet Reactive isometric rows step out/in + GTB  [facility] Adult PT Treatment:                                                DATE: 10/26/2023 Therapeutic Exercise: Seated with noodle behind back: Scap squeezes + Quick Dash intake Standing with back against noodle for postural feedback: Shoulder flexion to 90 degrees Shoulder scaption to 90 degrees Rolling ball up/down wall for shoulder flexion stretch  Seated bicep curls (Rt) + 5#DB Shoulder/elbow measurements (see above) Manual Therapy: PROM R elbow and supination/pronation (supine) Neuromuscular re-ed: Supine:  Scap punches + 3#dowel Shoulder flexion + 2#dowel to 90 degrees Supine + 2#MB: Small crosses x20 Circles CW/CCW x15 each Alphabet Reactive isometric rows step out/in + GTB   [facility] Adult PT Treatment:  DATE: 10/23/2023 Therapeutic Exercise: Standing with back against noodle for postural feedback: Scap retraction Shoulder flexion to 90 degrees Shoulder scaption to 90 degrees Rolling ball up/down wall for shoulder flexion stretch  Seated bicep curls (Rt) + 5#DB Neuromuscular re-ed: Reactive isometric rows step out/in + GTB Supine flexion to 90 deg with roller x 20 --> focus on scapula stability Supine scap punches + 3#DB (difficult) Standing scap punches with RTB & tactile cues Supine + 2#MB: Circles CW/CCW x15 each ABCs  Small crosses x20   [facility] Adult PT Treatment:                                                DATE: 10/18/2023 Therapeutic Exercise: Noodle scap retraction 5 sec hold x 10 Standing flexion to 90 deg x 10 Seated biceps curl 5# 2 x 15 Supine flexion to 90 deg with roller x 20 Standing scaption to 90 deg 2x 10 Neuromuscular re-ed: Standing ball on wall using red plyoball 2 direction Supine  circles CW/CCW with 3# wt  (90 deg flexion) Supine Alphabet with 3# wt (90 deg flexion) Manual Therapy: PROM R elbow and supination/pronation  (supine) Radial AP mobs and IASTM to R wrist extensors Supine rhythmic stabilization at 90 - 125 deg                                                                         PATIENT EDUCATION: Education details: PT eval findings, anticipated POC, initial HEP, and reviewed precautions and limitations  Person educated: Patient Education method: Explanation, Demonstration, and Handouts Education comprehension: verbalized understanding and returned demonstration  HOME EXERCISE PROGRAM: Access Code: [email or web address] URL: [email or web address] Date: 09/18/2023 Prepared by: Lowery Rue  Exercises - Seated Shoulder External Rotation AAROM with Cane and Hand in Neutral  - 1-2 x daily - 3 x weekly - 2 sets - 10 reps - Supine Shoulder Flexion Extension AAROM with Dowel  - 2 x daily - 7 x weekly - 1-3 sets - 10 reps - Wrist Flexion Extension AROM with Fingers Curled and Palm Down  - 2 x daily - 7 x weekly - 2 sets - 10 reps - Forearm Pronation and Supination With Arm Supported  - 2 x daily - 7 x weekly - 2 sets - 10 reps - Seated Scapular Retraction  - 1 x daily - 7 x weekly - 3 sets - 10 reps - Isometric Shoulder Abduction at Wall  - 1 x daily - 7 x weekly - 1 sets - 10 reps - 3 seconds hold - Isometric Shoulder Extension at Wall  - 1 x daily - 7 x weekly - 1 sets - 10 reps - 3 seconds hold - Isometric Shoulder Flexion at Wall  - 1 x daily - 7 x weekly - 1 sets - 10 reps - 3 seconds hold  ASSESSMENT:  CLINICAL IMPRESSION:  ***   OBJECTIVE IMPAIRMENTS: decreased activity tolerance, decreased balance, decreased ROM, decreased strength, increased edema, increased muscle spasms, impaired flexibility, impaired sensation, impaired UE functional use, postural dysfunction, obesity, and pain.    GOALS: Goals reviewed with patient?  Yes  SHORT  TERM GOALS: Target date: 10/11/2023   Patient will be independent with initial HEP.  Baseline:  Goal status: IN PROGRESS  2. Balance screen performed and appropriate goals set if indicated.  Baseline:  10/01/23: TUG - 8 sec; 5xSTS - 12 sec Goal status: MET    LONG TERM GOALS: Target date: 11/08/2023   Patient will be independent with advanced/ongoing HEP to improve outcomes and carryover.  Baseline:  Goal status: INITIAL  2.   Decreased Quick Dash by >= 10 points demonstrating improved functional ability.  Baseline: 72.7 10/26/23: 25.0 Goal status: MET 10/26/23  3.  Patient to improve R shoulder AROM to [facility] without pain provocation to allow for increased ease of ADLs.  Baseline:  Goal status: INITIAL  4.  Patient will demonstrate improved UE strength to 4+/5 to perform ADLS  Baseline:  Goal status: INITIAL  5. Patient to report no pain in R forearm with ADLs.  Baseline:  Goal status: INITIAL  6.  Patient will score >19/24 on the DGI to decrease fall risk.   Baseline: TBD Goal status: INITIAL   PLAN:  PT FREQUENCY: 2x/week  PT DURATION: 8 weeks  PLANNED INTERVENTIONS: 97164- PT Re-evaluation, 97110-Therapeutic exercises, 97530- Therapeutic activity, 97112- Neuromuscular re-education, 97535- Self Care, 16109- Manual therapy, [id number]- Electrical stimulation (unattended), 97016- Vasopneumatic device, [phone]- Ionotophoresis 4mg /ml Dexamethasone , Patient/Family education, Balance training, Taping, Dry Needling, Joint mobilization, Spinal mobilization, Cryotherapy, and Moist heat  PLAN FOR NEXT SESSION: Note: Prone shoulder strength affected by Melanoma surgery so limited in ability: Passive stretch wrist/forearm and elbow, progress scapular strength per protocol. P/AROM per protocol, deltoid isometrics. NO resisted shoulder ext/IR, don't push ER.  Pt able to lift up to 20# per MD note.   Jinx Mourning, PT  10/29/23, 8:21 PM

## 2023-10-30 ENCOUNTER — Encounter: Payer: Self-pay | Admitting: Physical Therapy

## 2023-10-30 ENCOUNTER — Ambulatory Visit: Admitting: Physical Therapy

## 2023-10-30 DIAGNOSIS — M79601 Pain in right arm: Secondary | ICD-10-CM | POA: Diagnosis not present

## 2023-10-30 DIAGNOSIS — R600 Localized edema: Secondary | ICD-10-CM

## 2023-10-30 DIAGNOSIS — R2681 Unsteadiness on feet: Secondary | ICD-10-CM | POA: Diagnosis not present

## 2023-10-30 DIAGNOSIS — R262 Difficulty in walking, not elsewhere classified: Secondary | ICD-10-CM | POA: Diagnosis not present

## 2023-10-30 DIAGNOSIS — M6281 Muscle weakness (generalized): Secondary | ICD-10-CM

## 2023-10-30 DIAGNOSIS — M25611 Stiffness of right shoulder, not elsewhere classified: Secondary | ICD-10-CM

## 2023-10-31 NOTE — Therapy (Incomplete)
 OUTPATIENT PHYSICAL THERAPY SHOULDER TREATMENT  AND RE-CERTIFICATION     Patient Name: David [name] MRN: [id number] DOB:1950-08-08, 73 y.o., male Today's Date: 10/31/2023  END OF SESSION:    Past Medical History:  Diagnosis Date   Arthritis    Balance problem    Barrett's esophagus    BBB (bundle branch block) 03/15/2016   Carpal tunnel syndrome    DDD (degenerative disc disease), lumbar 03/15/2016   Depression    Dysrhythmia    had some tachycardia with steroids   Elevated cholesterol 03/15/2016   Headache(784.0)    Ocular Migraines - takes Ambien  for it   Heart murmur    HTN (hypertension) 03/15/2016   Hyperlipemia    Hypertension    Malignant melanoma of right side of neck (HCC) 03/15/2016   73 years old   Neuralgia 03/15/2016   Ocular migraine    Osteoarthritis    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    RLS (restless legs syndrome)    Sleep apnea    uses CPAP   Snores    Status post gastric banding    Wears glasses    driving   Past Surgical History:  Procedure Laterality Date   ANTERIOR CERVICAL DECOMPRESSION/DISCECTOMY FUSION 4 LEVELS N/A 02/26/2020   Procedure: ANTERIOR CERVICAL DECOMPRESSION/DISCECTOMY FUSION, INTERBODY PROSTHESIS, PLATE/SCREWS CERVICAL THREE-FOUR CERVICAL FOUR-FIVE CERVICAL FIVE-SIX- CERVICAL SIX-SEVEN;  Surgeon: Garry Kansas, MD;  Location: [facility] OR;  Service: Neurosurgery;  Laterality: N/A;   CARPAL TUNNEL RELEASE Right 09/26/2012   Procedure: CARPAL TUNNEL RELEASE;  Surgeon: Milagros Alf, MD;  Location: [facility];  Service: Orthopedics;  Laterality: Right;   CARPAL TUNNEL RELEASE Left 10/24/2012   Procedure: CARPAL TUNNEL RELEASE;  Surgeon: Milagros Alf, MD;  Location: [facility];  Service: Orthopedics;  Laterality: Left;   CIRCUMCISION     as a baby   EXCISION MELANOMA WITH SENTINEL LYMPH NODE BIOPSY  1984   rt neck with  mulpipal nodes and some muscle excised rt neck-shoulder   KNEE ARTHROPLASTY     knee replacement     LAPAROSCOPIC GASTRIC BANDING  03/15/2009   LAPAROSCOPIC GASTRIC BANDING     MUSCLE BIOPSY  2004   rt thigh to r/o myocitis   nerve conduction velosity test     REVERSE SHOULDER ARTHROPLASTY Right 08/21/2023   Procedure: ARTHROPLASTY, SHOULDER, TOTAL, REVERSE;  Surgeon: Jasmine Mesi, MD;  Location: [facility] OR;  Service: Orthopedics;  Laterality: Right;  RIGHT REVERSE SHOULDER ARTHROPLASTY   SHOULDER SURGERY Right 12/2021   TONSILLECTOMY     TOTAL KNEE ARTHROPLASTY Bilateral 12/05/2010   TRIGGER FINGER RELEASE Right 09/26/2012   Procedure: RELEASE TRIGGER FINGER/A-1 PULLEY RING AND SMALL ;  Surgeon: Milagros Alf, MD;  Location: [facility];  Service: Orthopedics;  Laterality: Right;   TRIGGER FINGER RELEASE Left 10/24/2012   Procedure: RELEASE TRIGGER FINGER/A-1 PULLEY LEFT MIDDLE AND LEFT RING;  Surgeon: Milagros Alf, MD;  Location: [facility];  Service: Orthopedics;  Laterality: Left;   UPPER GI ENDOSCOPY     Patient Active Problem List   Diagnosis Date Noted   Rotator cuff arthropathy, right 08/21/2023   S/P reverse total shoulder arthroplasty, right 08/21/2023   Sepsis due to undetermined organism (HCC) 08/20/2022   Cervical spondylosis with radiculopathy 02/26/2020   SBO (small bowel obstruction) (HCC) 08/21/2017   Trochanteric bursitis of left hip 01/18/2017  Lateral epicondylitis, right elbow 01/18/2017   Rheumatoid arthritis (HCC) 03/15/2016   DJD (degenerative joint disease), cervical 03/15/2016   DDD (degenerative disc disease), lumbar 03/15/2016   HTN (hypertension) 03/15/2016   Obesity 03/15/2016   Elevated cholesterol 03/15/2016   Neuralgia 03/15/2016   Malignant melanoma of right side of neck (HCC) 03/15/2016   BBB (bundle branch block) 03/15/2016   High risk medication use 03/15/2016   Osteoarthritis of lumbar spine 03/15/2016    Primary osteoarthritis of both hands 03/15/2016   Primary osteoarthritis of both knees 03/15/2016   Mesenteric adenitis 06/29/2015    PCP: Stamey, Lowell Rude, FNP   REFERRING PROVIDER: Casilda Clayman, PA-C   REFERRING DIAG: [id number] (ICD-10-CM) - S/P reverse total shoulder arthroplasty, Right (With subscapularis repair)  THERAPY DIAG:  No diagnosis found.  Rationale for Evaluation and Treatment: Rehabilitation  ONSET DATE: 08/21/23 DOS  SUBJECTIVE:                                                                                                                                                                                      SUBJECTIVE STATEMENT: ***  10/30/23: 10 weeks post op Hand dominance: Right  PERTINENT HISTORY: H/o falls,  ACDF, B TKA, HTN, h/o malignant melanoma in R neck affecting R trapezius muscle and R UE ROM  PAIN:  Are you having pain? No  PRECAUTIONS: Shoulder As of 10/03/23: Able to lift up to but not beyond 20#.  At Eval: Okay to very lightly lift with the operative extremity but no lifting anything heavier than a coffee cup or cell phone. Start physical therapy to focus on passive range of motion and active range of motion with deltoid isometrics. Do not want to externally rotate past 30 degrees to protect subscapularis repair.   RED FLAGS: None   WEIGHT BEARING RESTRICTIONS: Yes R shoulder  FALLS:  Has patient fallen in last 6 months? Yes. Number of falls 4 tripping over things in cluttered garage  LIVING ENVIRONMENT: Lives with: lives with their spouse Lives in: House/apartment Stairs: Yes: Internal: 15 steps; on left going up and bilateral but cannot reach both and External: 3 steps; no railing on deck Has following equipment at home: Single point cane and Crutches  OCCUPATION: retired  PLOF: Independent  PATIENT GOALS:to be able to sleep in bed on side without pain, currently on couch or in bed on back  NEXT MD VISIT: Early June  [name])  OBJECTIVE:  Note: Objective measures were completed at Evaluation unless otherwise noted.  DIAGNOSTIC FINDINGS:  none  PATIENT SURVEYS:  Quick Dash  72.7 / 100 = 72.7 %  COGNITION: Overall cognitive status: Within functional limits  for tasks assessed     SENSATION: Mild pull in post distal/lateral  wrist  POSTURE: Rounded shoulders  UPPER EXTREMITY ROM:   A/P ROM Right eval Left eval Right 5/8//25 Right 5/27 Right 10/26/23 Right  6/10  Shoulder flexion 130/142   150 active supine 70 active supine 150 active supine  Shoulder extension        Shoulder abduction 100 P   118 active 111 active supine   Shoulder adduction        Shoulder internal rotation    65    Shoulder external rotation 30/30   50    Elbow flexion        Elbow extension -23  -16 -11/0 -2 AAROM   Wrist flexion WFL       Wrist extension        Wrist ulnar deviation        Wrist radial deviation        Wrist pronation        Wrist supination        (Blank rows = not tested) R wrist limited since melanoma surgery  A/P ROM Right 6/12  Shoulder flexion 150 supine  Shoulder extension   Shoulder abduction   Shoulder adduction   Shoulder internal rotation   Shoulder external rotation   Elbow flexion   Elbow extension   Wrist flexion WFL  Wrist extension   Wrist ulnar deviation   Wrist radial deviation   Wrist pronation   Wrist supination       UPPER EXTREMITY MMT: NT due to surgery  PALPATION:  TTP in ant and post shoulder, wrist extensors    [facility] Adult PT Treatment:                                                DATE: 11/01/2023 Therapeutic Exercise: Seated with noodle behind back: Scap squeezes Standing with back against noodle for postural feedback: Shoulder flexion to 90 degrees Shoulder scaption to 90 degrees -  Rolling ball up/down wall for shoulder flexion stretch  Seated bicep curls (Rt) + 5#DB - upper arm supported with folded 3# wt Seated and standing triceps ext  with 2# wt (standing better) Manual Therapy: PROM R elbow and shoulder flex Neuromuscular re-ed: DGI Supine:  Scap punches + 3#dowel Shoulder flexion + 2#dowel to 90 degrees Flex with 2# wt to 90 R only with scap retraction - use support under arm Scatpion 1# to 90 deg - use support under arm Supine + 2#MB: Small crosses x20 Circles CW/CCW x15 each Alphabet  [facility] Adult PT Treatment:                                                DATE: 10/30/2023 Therapeutic Exercise: Seated with noodle behind back: Scap squeezes Standing with back against noodle for postural feedback: Shoulder flexion to 90 degrees Shoulder scaption to 90 degrees -  Rolling ball up/down wall for shoulder flexion stretch  Seated bicep curls (Rt) + 5#DB - upper arm supported with folded 3# wt Seated and standing triceps ext with 2# wt (standing better) Manual Therapy: PROM R elbow and shoulder flex Neuromuscular re-ed: Supine:  Scap punches + 3#dowel Shoulder flexion + 2#dowel to 90  degrees Flex with 2# wt to 90 R only with scap retraction - use support under arm Scatpion 1# to 90 deg - use support under arm Supine + 2#MB: Small crosses x20 Circles CW/CCW x15 each Alphabet   [facility] Adult PT Treatment:                                                DATE: 10/26/2023 Therapeutic Exercise: Seated with noodle behind back: Scap squeezes + Quick Dash intake Standing with back against noodle for postural feedback: Shoulder flexion to 90 degrees Shoulder scaption to 90 degrees Rolling ball up/down wall for shoulder flexion stretch  Seated bicep curls (Rt) + 5#DB Shoulder/elbow measurements (see above) Manual Therapy: PROM R elbow and supination/pronation (supine) Neuromuscular re-ed: Supine:  Scap punches + 3#dowel Shoulder flexion + 2#dowel to 90 degrees Supine + 2#MB: Small crosses x20 Circles CW/CCW x15 each Alphabet Reactive isometric rows step out/in + GTB   [facility] Adult PT Treatment:                                                 DATE: 10/23/2023 Therapeutic Exercise: Standing with back against noodle for postural feedback: Scap retraction Shoulder flexion to 90 degrees Shoulder scaption to 90 degrees Rolling ball up/down wall for shoulder flexion stretch  Seated bicep curls (Rt) + 5#DB Neuromuscular re-ed: Reactive isometric rows step out/in + GTB Supine flexion to 90 deg with roller x 20 --> focus on scapula stability Supine scap punches + 3#DB (difficult) Standing scap punches with RTB & tactile cues Supine + 2#MB: Circles CW/CCW x15 each ABCs  Small crosses x20   [facility] Adult PT Treatment:                                                DATE: 10/18/2023 Therapeutic Exercise: Noodle scap retraction 5 sec hold x 10 Standing flexion to 90 deg x 10 Seated biceps curl 5# 2 x 15 Supine flexion to 90 deg with roller x 20 Standing scaption to 90 deg 2x 10 Neuromuscular re-ed: Standing ball on wall using red plyoball 2 direction Supine circles CW/CCW with 3# wt  (90 deg flexion) Supine Alphabet with 3# wt (90 deg flexion) Manual Therapy: PROM R elbow and supination/pronation  (supine) Radial AP mobs and IASTM to R wrist extensors Supine rhythmic stabilization at 90 - 125 deg                                                                         PATIENT EDUCATION: Education details: PT eval findings, anticipated POC, initial HEP, and reviewed precautions and limitations  Person educated: Patient Education method: Explanation, Demonstration, and Handouts Education comprehension: verbalized understanding and returned demonstration  HOME EXERCISE PROGRAM: Access Code: [email or web address] URL: [email or web address] Date: 09/18/2023 Prepared by: Lowery Rue  Exercises - Seated  Shoulder External Rotation AAROM with Cane and Hand in Neutral  - 1-2 x daily - 3 x weekly - 2 sets - 10 reps - Supine Shoulder Flexion Extension AAROM with Dowel  - 2 x daily - 7 x weekly - 1-3  sets - 10 reps - Wrist Flexion Extension AROM with Fingers Curled and Palm Down  - 2 x daily - 7 x weekly - 2 sets - 10 reps - Forearm Pronation and Supination With Arm Supported  - 2 x daily - 7 x weekly - 2 sets - 10 reps - Seated Scapular Retraction  - 1 x daily - 7 x weekly - 3 sets - 10 reps - Isometric Shoulder Abduction at Wall  - 1 x daily - 7 x weekly - 1 sets - 10 reps - 3 seconds hold - Isometric Shoulder Extension at Wall  - 1 x daily - 7 x weekly - 1 sets - 10 reps - 3 seconds hold - Isometric Shoulder Flexion at Wall  - 1 x daily - 7 x weekly - 1 sets - 10 reps - 3 seconds hold  ASSESSMENT:  CLINICAL IMPRESSION:  ***  . MD note needed next visit and re-cert for week before vacation.   OBJECTIVE IMPAIRMENTS: decreased activity tolerance, decreased balance, decreased ROM, decreased strength, increased edema, increased muscle spasms, impaired flexibility, impaired sensation, impaired UE functional use, postural dysfunction, obesity, and pain.    GOALS: Goals reviewed with patient? Yes  SHORT TERM GOALS: Target date: 10/11/2023   Patient will be independent with initial HEP.  Baseline:  Goal status: IN PROGRESS  2. Balance screen performed and appropriate goals set if indicated.  Baseline:  10/01/23: TUG - 8 sec; 5xSTS - 12 sec Goal status: MET    LONG TERM GOALS: Target date: 11/08/2023   Patient will be independent with advanced/ongoing HEP to improve outcomes and carryover.  Baseline:  Goal status: INITIAL  2.   Decreased Quick Dash by >= 10 points demonstrating improved functional ability.  Baseline: 72.7 10/26/23: 25.0 Goal status: MET 10/26/23  3.  Patient to improve R shoulder AROM to [facility] without pain provocation to allow for increased ease of ADLs.  Baseline:  Goal status: INITIAL  4.  Patient will demonstrate improved UE strength to 4+/5 to perform ADLS  Baseline:  Goal status: INITIAL  5. Patient to report no pain in R forearm with ADLs.  Baseline:   Goal status: INITIAL  6.  Patient will score >19/24 on the DGI to decrease fall risk.   Baseline: TBD Goal status: INITIAL   PLAN:  PT FREQUENCY: 2x/week  PT DURATION: 8 weeks  PLANNED INTERVENTIONS: 97164- PT Re-evaluation, 97110-Therapeutic exercises, 97530- Therapeutic activity, 97112- Neuromuscular re-education, 97535- Self Care, 40102- Manual therapy, [id number]- Electrical stimulation (unattended), 97016- Vasopneumatic device, 97033- Ionotophoresis 4mg /ml Dexamethasone , Patient/Family education, Balance training, Taping, Dry Needling, Joint mobilization, Spinal mobilization, Cryotherapy, and Moist heat  PLAN FOR NEXT SESSION: MD NOTE, DO DGI, check goals: Note: Prone shoulder strength affected by Melanoma surgery so limited in ability: Passive stretch wrist/forearm and elbow, progress scapular strength per protocol. P/AROM per protocol, deltoid isometrics. NO resisted shoulder ext/IR, don't push ER.  Pt able to lift up to 20# per MD note.   Jinx Mourning, PT  10/31/23, 8:15 PM

## 2023-11-01 ENCOUNTER — Ambulatory Visit: Admitting: Physical Therapy

## 2023-11-05 DIAGNOSIS — I10 Essential (primary) hypertension: Secondary | ICD-10-CM | POA: Diagnosis not present

## 2023-11-05 DIAGNOSIS — R739 Hyperglycemia, unspecified: Secondary | ICD-10-CM | POA: Diagnosis not present

## 2023-11-05 DIAGNOSIS — E782 Mixed hyperlipidemia: Secondary | ICD-10-CM | POA: Diagnosis not present

## 2023-11-05 NOTE — Therapy (Signed)
 OUTPATIENT PHYSICAL THERAPY SHOULDER TREATMENT  AND RE-CERTIFICATION     Patient Name: David [name] MRN: [id number] DOB:[birth year], 73 y.o., male Today's Date: 11/06/2023  END OF SESSION:  PT End of Session - 11/06/23 1149     Visit Number 13    Date for PT Re-Evaluation 12/21/23    Authorization Type [employer] MCR    Authorization - Visit Number 13    Progress Note Due on Visit 21    PT Start Time 1149    PT Stop Time 1231    PT Time Calculation (min) 42 min    Activity Tolerance Patient tolerated treatment well    Behavior During Therapy WFL for tasks assessed/performed           Past Medical History:  Diagnosis Date   Arthritis    Balance problem    Barrett's esophagus    BBB (bundle branch block) 03/15/2016   Carpal tunnel syndrome    DDD (degenerative disc disease), lumbar 03/15/2016   Depression    Dysrhythmia    had some tachycardia with steroids   Elevated cholesterol 03/15/2016   Headache(784.0)    Ocular Migraines - takes Ambien  for it   Heart murmur    HTN (hypertension) 03/15/2016   Hyperlipemia    Hypertension    Malignant melanoma of right side of neck (HCC) 03/15/2016   73 years old   Neuralgia 03/15/2016   Ocular migraine    Osteoarthritis    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    RLS (restless legs syndrome)    Sleep apnea    uses CPAP   Snores    Status post gastric banding    Wears glasses    driving   Past Surgical History:  Procedure Laterality Date   ANTERIOR CERVICAL DECOMPRESSION/DISCECTOMY FUSION 4 LEVELS N/A 02/26/2020   Procedure: ANTERIOR CERVICAL DECOMPRESSION/DISCECTOMY FUSION, INTERBODY PROSTHESIS, PLATE/SCREWS CERVICAL THREE-FOUR CERVICAL FOUR-FIVE CERVICAL FIVE-SIX- CERVICAL SIX-SEVEN;  Surgeon: Mavis Purchase, MD;  Location: [facility] OR;  Service: Neurosurgery;  Laterality: N/A;   CARPAL TUNNEL RELEASE Right 09/26/2012   Procedure: CARPAL TUNNEL  RELEASE;  Surgeon: Franky JONELLE Curia, MD;  Location: [facility];  Service: Orthopedics;  Laterality: Right;   CARPAL TUNNEL RELEASE Left 10/24/2012   Procedure: CARPAL TUNNEL RELEASE;  Surgeon: Franky JONELLE Curia, MD;  Location: [facility];  Service: Orthopedics;  Laterality: Left;   CIRCUMCISION     as a baby   EXCISION MELANOMA WITH SENTINEL LYMPH NODE BIOPSY  1984   rt neck with mulpipal nodes and some muscle excised rt neck-shoulder   KNEE ARTHROPLASTY     knee replacement     LAPAROSCOPIC GASTRIC BANDING  03/15/2009   LAPAROSCOPIC GASTRIC BANDING     MUSCLE BIOPSY  2004   rt thigh to r/o myocitis   nerve conduction velosity test     REVERSE SHOULDER ARTHROPLASTY Right 08/21/2023   Procedure: ARTHROPLASTY, SHOULDER, TOTAL, REVERSE;  Surgeon: Addie Cordella Hamilton, MD;  Location: [facility] OR;  Service: Orthopedics;  Laterality: Right;  RIGHT REVERSE SHOULDER ARTHROPLASTY   SHOULDER SURGERY Right 12/2021   TONSILLECTOMY     TOTAL KNEE ARTHROPLASTY Bilateral 12/05/2010   TRIGGER FINGER RELEASE Right 09/26/2012   Procedure: RELEASE TRIGGER FINGER/A-1 PULLEY RING AND SMALL ;  Surgeon: Franky JONELLE Curia, MD;  Location: [facility];  Service: Orthopedics;  Laterality: Right;   TRIGGER FINGER RELEASE Left 10/24/2012  Procedure: RELEASE TRIGGER FINGER/A-1 PULLEY LEFT MIDDLE AND LEFT RING;  Surgeon: Franky JONELLE Curia, MD;  Location: [facility];  Service: Orthopedics;  Laterality: Left;   UPPER GI ENDOSCOPY     Patient Active Problem List   Diagnosis Date Noted   Rotator cuff arthropathy, right 08/21/2023   S/P reverse total shoulder arthroplasty, right 08/21/2023   Sepsis due to undetermined organism (HCC) 08/20/2022   Cervical spondylosis with radiculopathy 02/26/2020   SBO (small bowel obstruction) (HCC) 08/21/2017   Trochanteric bursitis of left hip 01/18/2017   Lateral epicondylitis, right elbow 01/18/2017   Rheumatoid arthritis (HCC) 03/15/2016   DJD  (degenerative joint disease), cervical 03/15/2016   DDD (degenerative disc disease), lumbar 03/15/2016   HTN (hypertension) 03/15/2016   Obesity 03/15/2016   Elevated cholesterol 03/15/2016   Neuralgia 03/15/2016   Malignant melanoma of right side of neck (HCC) 03/15/2016   BBB (bundle branch block) 03/15/2016   High risk medication use 03/15/2016   Osteoarthritis of lumbar spine 03/15/2016   Primary osteoarthritis of both hands 03/15/2016   Primary osteoarthritis of both knees 03/15/2016   Mesenteric adenitis 06/29/2015    PCP: Stamey, Chiquita POUR, FNP   REFERRING PROVIDER: Shirly Carlin CROME, PA-C   REFERRING DIAG: [phone] (ICD-10-CM) - S/P reverse total shoulder arthroplasty, Right (With subscapularis repair)  THERAPY DIAG:  Stiffness of right shoulder, not elsewhere classified - Plan: PT plan of care cert/re-cert  Muscle weakness (generalized) - Plan: PT plan of care cert/re-cert  Unsteadiness on feet - Plan: PT plan of care cert/re-cert  Localized edema - Plan: PT plan of care cert/re-cert  Rationale for Evaluation and Treatment: Rehabilitation  ONSET DATE: 08/21/23 DOS  SUBJECTIVE:                                                                                                                                                                                      SUBJECTIVE STATEMENT: I have not been getting good sleep. I got 7 hours the other day, but last night was bad.   10/30/23: 11 weeks post op Hand dominance: Right  PERTINENT HISTORY: H/o falls,  ACDF, B TKA, HTN, h/o malignant melanoma in R neck affecting R trapezius muscle and R UE ROM  PAIN:  Are you having pain? No  PRECAUTIONS: Shoulder As of 10/03/23: Able to lift up to but not beyond 20#.  At Eval: Okay to very lightly lift with the operative extremity but no lifting anything heavier than a coffee cup or cell phone. Start physical therapy to focus on passive range of motion and active range of motion  with deltoid isometrics. Do not want to externally rotate past  30 degrees to protect subscapularis repair.   RED FLAGS: None   WEIGHT BEARING RESTRICTIONS: Yes R shoulder  FALLS:  Has patient fallen in last 6 months? Yes. Number of falls 4 tripping over things in cluttered garage  LIVING ENVIRONMENT: Lives with: lives with their spouse Lives in: House/apartment Stairs: Yes: Internal: 15 steps; on left going up and bilateral but cannot reach both and External: 3 steps; no railing on deck Has following equipment at home: Single point cane and Crutches  OCCUPATION: retired  PLOF: Independent  PATIENT GOALS:to be able to sleep in bed on side without pain, currently on couch or in bed on back  NEXT MD VISIT: Early June Billi)  OBJECTIVE:  Note: Objective measures were completed at Evaluation unless otherwise noted.  DIAGNOSTIC FINDINGS:  none  PATIENT SURVEYS:  Quick Dash  72.7 / 100 = 72.7 %  11/06/23  36.4 / 100 = 36.4 %  COGNITION: Overall cognitive status: Within functional limits for tasks assessed     SENSATION: Mild pull in post distal/lateral  wrist  POSTURE: Rounded shoulders  UPPER EXTREMITY ROM:   A/P ROM Right eval Left eval Right 5/8//25 Right 5/27 Right 10/26/23 Right  6/10  Shoulder flexion 130/142   150 active supine 70 active supine 150 active supine  Shoulder extension        Shoulder abduction 100 P   118 active 111 active supine   Shoulder adduction        Shoulder internal rotation    65    Shoulder external rotation 30/30   50    Elbow flexion        Elbow extension -23  -16 -11/0 -2 AAROM   Wrist flexion WFL       Wrist extension        Wrist ulnar deviation        Wrist radial deviation        Wrist pronation        Wrist supination        (Blank rows = not tested) R wrist limited since melanoma surgery  A/P ROM Right 6/12 Right 6/17  Shoulder flexion 150 supine 153 supine 79 deg sitting  Shoulder extension    Shoulder  abduction  110  Shoulder adduction    Shoulder internal rotation  48  Shoulder external rotation  70  Elbow flexion    Elbow extension    Wrist flexion WFL   Wrist extension    Wrist ulnar deviation    Wrist radial deviation    Wrist pronation    Wrist supination        UPPER EXTREMITY MMT: NT due to surgery  PALPATION:  TTP in ant and post shoulder, wrist extensors    [facility] Adult PT Treatment:                                                DATE: 11/06/2023 Therapeutic Activities:  MD note/ROM/goals assessed Therapeutic Exercise: On green swiss ball: Bent over shoulder ext to neutral 1# x 20;  triceps ext with 2# wt x 20; horizontal ABD 0# x 20, row x 20 no wt  Neuromuscular re-ed: Supine:  S/L flexion 2# 2 x 10 Shoulder flexion 2# and 3# popping in shoulder Flex with 2# wt to 90 R only with scap retraction - use support under arm  popping in pectoralis which is controlled when PT applied overpressure. Shoulder flexion 3#AW on dowel x 20 Scaption 1# to 90 deg - use support under arm  [facility] Adult PT Treatment:                                                DATE: 10/30/2023 Therapeutic Exercise: Seated with noodle behind back: Scap squeezes Standing with back against noodle for postural feedback: Shoulder flexion to 90 degrees Shoulder scaption to 90 degrees -  Rolling ball up/down wall for shoulder flexion stretch  Seated bicep curls (Rt) + 5#DB - upper arm supported with folded 3# wt Seated and standing triceps ext with 2# wt (standing better) Manual Therapy: PROM R elbow and shoulder flex Neuromuscular re-ed: Supine:  Scap punches + 3#dowel Shoulder flexion + 2#dowel to 90 degrees Flex with 2# wt to 90 R only with scap retraction - use support under arm Scatpion 1# to 90 deg - use support under arm Supine + 2#MB: Small crosses x20 Circles CW/CCW x15 each Alphabet   [facility] Adult PT Treatment:                                                DATE:  10/26/2023 Therapeutic Exercise: Seated with noodle behind back: Scap squeezes + Quick Dash intake Standing with back against noodle for postural feedback: Shoulder flexion to 90 degrees Shoulder scaption to 90 degrees Rolling ball up/down wall for shoulder flexion stretch  Seated bicep curls (Rt) + 5#DB Shoulder/elbow measurements (see above) Manual Therapy: PROM R elbow and supination/pronation (supine) Neuromuscular re-ed: Supine:  Scap punches + 3#dowel Shoulder flexion + 2#dowel to 90 degrees Supine + 2#MB: Small crosses x20 Circles CW/CCW x15 each Alphabet Reactive isometric rows step out/in + GTB   [facility] Adult PT Treatment:                                                DATE: 10/23/2023 Therapeutic Exercise: Standing with back against noodle for postural feedback: Scap retraction Shoulder flexion to 90 degrees Shoulder scaption to 90 degrees Rolling ball up/down wall for shoulder flexion stretch  Seated bicep curls (Rt) + 5#DB Neuromuscular re-ed: Reactive isometric rows step out/in + GTB Supine flexion to 90 deg with roller x 20 --> focus on scapula stability Supine scap punches + 3#DB (difficult) Standing scap punches with RTB & tactile cues Supine + 2#MB: Circles CW/CCW x15 each ABCs  Small crosses x20                                                                         PATIENT EDUCATION: Education details: PT eval findings, anticipated POC, initial HEP, and reviewed precautions and limitations  Person educated: Patient Education method: Explanation, Demonstration, and Handouts Education comprehension: verbalized understanding and returned demonstration  HOME EXERCISE PROGRAM:  Access Code: [email or web address] URL: [email or web address] Date: 09/18/2023 Prepared by: Darice Conine  Exercises - Seated Shoulder External Rotation AAROM with Cane and Hand in Neutral  - 1-2 x daily - 3 x weekly - 2 sets - 10 reps - Supine Shoulder Flexion Extension  AAROM with Dowel  - 2 x daily - 7 x weekly - 1-3 sets - 10 reps - Wrist Flexion Extension AROM with Fingers Curled and Palm Down  - 2 x daily - 7 x weekly - 2 sets - 10 reps - Forearm Pronation and Supination With Arm Supported  - 2 x daily - 7 x weekly - 2 sets - 10 reps - Seated Scapular Retraction  - 1 x daily - 7 x weekly - 3 sets - 10 reps - Isometric Shoulder Abduction at Wall  - 1 x daily - 7 x weekly - 1 sets - 10 reps - 3 seconds hold - Isometric Shoulder Extension at Wall  - 1 x daily - 7 x weekly - 1 sets - 10 reps - 3 seconds hold - Isometric Shoulder Flexion at Wall  - 1 x daily - 7 x weekly - 1 sets - 10 reps - 3 seconds hold  ASSESSMENT:  CLINICAL IMPRESSION: Patient's R shoulder ROM is WFL, but he is limited functionally with ROM by weakness at this time. He has some popping at the corocoid process with supine flexion with light resistance. He reports no pain into the R UE which was present at eval and his elbow extension is near full range, also limited by triceps weakness. Patient continues to demonstrate potential for improvement and would benefit from continued skilled therapy to address impairments.  I recommend 2x/wk for 6 wks.     OBJECTIVE IMPAIRMENTS: decreased activity tolerance, decreased balance, decreased ROM, decreased strength, increased edema, increased muscle spasms, impaired flexibility, impaired sensation, impaired UE functional use, postural dysfunction, obesity, and pain.    GOALS: Goals reviewed with patient? Yes  SHORT TERM GOALS: Target date: 10/11/2023   Patient will be independent with initial HEP.  Baseline:  Goal status: IN PROGRESS  2. Balance screen performed and appropriate goals set if indicated.  Baseline:  10/01/23: TUG - 8 sec; 5xSTS - 12 sec Goal status: MET    LONG TERM GOALS: Target date: 11/08/2023   Patient will be independent with advanced/ongoing HEP to improve outcomes and carryover.  Baseline:  Goal status: IN PROGRESS  11/06/23  2.   Decreased Quick Dash by >= 10 points demonstrating improved functional ability.  Baseline: 72.7 10/26/23: 25.0 Goal status: MET 10/26/23  3.  Patient to improve R shoulder AROM to [facility] without pain provocation to allow for increased ease of ADLs.  Baseline:  Goal status: IN PROGRESS 11/06/23  4.  Patient will demonstrate improved UE strength to 4+/5 to perform ADLS  Baseline:  Goal status: IN PROGRESS 11/06/23  5. Patient to report no pain in R forearm with ADLs.  Baseline:  Goal status: MET 11/06/23  6.  Patient will score >19/24 on the DGI to decrease fall risk.   Baseline: TBD Goal status: goal d/c   PLAN:  PT FREQUENCY: 2x/week  PT DURATION: 6 weeks  PLANNED INTERVENTIONS: 97164- PT Re-evaluation, 97110-Therapeutic exercises, 97530- Therapeutic activity, [id number]- Neuromuscular re-education, 97535- Self Care, 02859- Manual therapy, G0283- Electrical stimulation (unattended), 97016- Vasopneumatic device, [id number]- Ionotophoresis 4mg /ml Dexamethasone , Patient/Family education, Balance training, Taping, Dry Needling, Joint mobilization, Spinal mobilization, Cryotherapy, and Moist heat  PLAN FOR NEXT SESSION: Re-cert sent 3/82 (no  MD note needs to be sent for 11/12/23). Note: Prone shoulder strength affected by Melanoma surgery so limited in ability: progress scapular strength per protocol. . NO resisted shoulder ext/IR, don't push ER.  Pt able to lift up to 20# per MD note.   Mliss Cummins, PT 11/06/23, 3:11 PM

## 2023-11-06 ENCOUNTER — Encounter: Payer: Self-pay | Admitting: Physical Therapy

## 2023-11-06 ENCOUNTER — Ambulatory Visit: Admitting: Physical Therapy

## 2023-11-06 DIAGNOSIS — R600 Localized edema: Secondary | ICD-10-CM | POA: Diagnosis not present

## 2023-11-06 DIAGNOSIS — R2681 Unsteadiness on feet: Secondary | ICD-10-CM

## 2023-11-06 DIAGNOSIS — M25611 Stiffness of right shoulder, not elsewhere classified: Secondary | ICD-10-CM | POA: Diagnosis not present

## 2023-11-06 DIAGNOSIS — M79601 Pain in right arm: Secondary | ICD-10-CM | POA: Diagnosis not present

## 2023-11-06 DIAGNOSIS — M6281 Muscle weakness (generalized): Secondary | ICD-10-CM

## 2023-11-06 DIAGNOSIS — R262 Difficulty in walking, not elsewhere classified: Secondary | ICD-10-CM | POA: Diagnosis not present

## 2023-11-08 ENCOUNTER — Ambulatory Visit

## 2023-11-08 ENCOUNTER — Ambulatory Visit: Payer: Medicare Other | Attending: Rheumatology | Admitting: Rheumatology

## 2023-11-08 ENCOUNTER — Encounter: Payer: Self-pay | Admitting: Rheumatology

## 2023-11-08 VITALS — BP 101/59 | HR 65 | Resp 16 | Ht 68.0 in | Wt 251.2 lb

## 2023-11-08 DIAGNOSIS — Z79899 Other long term (current) drug therapy: Secondary | ICD-10-CM | POA: Diagnosis not present

## 2023-11-08 DIAGNOSIS — Z8639 Personal history of other endocrine, nutritional and metabolic disease: Secondary | ICD-10-CM

## 2023-11-08 DIAGNOSIS — M51369 Other intervertebral disc degeneration, lumbar region without mention of lumbar back pain or lower extremity pain: Secondary | ICD-10-CM

## 2023-11-08 DIAGNOSIS — M0609 Rheumatoid arthritis without rheumatoid factor, multiple sites: Secondary | ICD-10-CM

## 2023-11-08 DIAGNOSIS — R748 Abnormal levels of other serum enzymes: Secondary | ICD-10-CM

## 2023-11-08 DIAGNOSIS — Z8619 Personal history of other infectious and parasitic diseases: Secondary | ICD-10-CM

## 2023-11-08 DIAGNOSIS — L0100 Impetigo, unspecified: Secondary | ICD-10-CM

## 2023-11-08 DIAGNOSIS — Z96611 Presence of right artificial shoulder joint: Secondary | ICD-10-CM | POA: Diagnosis not present

## 2023-11-08 DIAGNOSIS — M19011 Primary osteoarthritis, right shoulder: Secondary | ICD-10-CM

## 2023-11-08 DIAGNOSIS — Z96653 Presence of artificial knee joint, bilateral: Secondary | ICD-10-CM

## 2023-11-08 DIAGNOSIS — Z85820 Personal history of malignant melanoma of skin: Secondary | ICD-10-CM

## 2023-11-08 DIAGNOSIS — M19041 Primary osteoarthritis, right hand: Secondary | ICD-10-CM

## 2023-11-08 DIAGNOSIS — M19042 Primary osteoarthritis, left hand: Secondary | ICD-10-CM

## 2023-11-08 DIAGNOSIS — M5030 Other cervical disc degeneration, unspecified cervical region: Secondary | ICD-10-CM

## 2023-11-08 DIAGNOSIS — M24521 Contracture, right elbow: Secondary | ICD-10-CM

## 2023-11-08 NOTE — Addendum Note (Signed)
 Addended by: Adrianne Horn on: 11/08/2023 10:49 AM   Modules accepted: Orders

## 2023-11-08 NOTE — Patient Instructions (Signed)
 Standing Labs We placed an order today for your standing lab work.   Please have your standing labs drawn in July and every 3 months  Please have your labs drawn 2 weeks prior to your appointment so that the provider can discuss your lab results at your appointment, if possible.  Please note that you may see your imaging and lab results in MyChart before we have reviewed them. We will contact you once all results are reviewed. Please allow our office up to 72 hours to thoroughly review all of the results before contacting the office for clarification of your results.  WALK-IN LAB HOURS  Monday through Thursday from 8:00 am -12:30 pm and 1:00 pm-4:00 pm and Friday from 8:00 am-12:00 pm.  Patients with office visits requiring labs will be seen before walk-in labs.  You may encounter longer than normal wait times. Please allow additional time. Wait times may be shorter on  Monday and Thursday afternoons.  We do not book appointments for walk-in labs. We appreciate your patience and understanding with our staff.   Labs are drawn by [facility]. Please bring your co-pay at the time of your lab draw.  You may receive a bill from [facility] for your lab work.  Please note if you are on Hydroxychloroquine and and an order has been placed for a Hydroxychloroquine level,  you will need to have it drawn 4 hours or more after your last dose.  If you wish to have your labs drawn at another location, please call the office 24 hours in advance so we can fax the orders.  The office is located at [address], [address], [place], [place] [address]   If you have any questions regarding directions or hours of operation,  please call [phone].   As a reminder, please drink plenty of water prior to coming for your lab work. Thanks!   Vaccines You are taking a medication(s) that can suppress your immune system.  The following immunizations are recommended: Flu annually Covid-19  Td/Tdap (tetanus, diphtheria,  pertussis) every 10 years Pneumonia (Prevnar 15 then Pneumovax 23 at least 1 year apart.  Alternatively, can take Prevnar 20 without needing additional dose) Shingrix: 2 doses from 4 weeks to 6 months apart  Please check with your PCP to make sure you are up to date.   If you have signs or symptoms of an infection or start antibiotics: First, call your PCP for workup of your infection. Hold your medication through the infection, until you complete your antibiotics, and until symptoms resolve if you take the following: Injectable medication (Actemra, Benlysta, Cimzia, Cosentyx, Enbrel, Humira, Kevzara, Orencia, Remicade, Simponi, Stelara, Taltz, Tremfya) Methotrexate  Leflunomide (Arava) Mycophenolate (Cellcept) Xeljanz, Olumiant, or Rinvoq

## 2023-11-09 DIAGNOSIS — G4733 Obstructive sleep apnea (adult) (pediatric): Secondary | ICD-10-CM | POA: Diagnosis not present

## 2023-11-12 ENCOUNTER — Other Ambulatory Visit: Payer: Self-pay

## 2023-11-12 ENCOUNTER — Ambulatory Visit: Admitting: Surgical

## 2023-11-12 ENCOUNTER — Encounter: Payer: Self-pay | Admitting: Surgical

## 2023-11-12 DIAGNOSIS — M25512 Pain in left shoulder: Secondary | ICD-10-CM | POA: Diagnosis not present

## 2023-11-12 DIAGNOSIS — Z96611 Presence of right artificial shoulder joint: Secondary | ICD-10-CM

## 2023-11-12 DIAGNOSIS — M75122 Complete rotator cuff tear or rupture of left shoulder, not specified as traumatic: Secondary | ICD-10-CM | POA: Diagnosis not present

## 2023-11-12 MED ORDER — BUPIVACAINE HCL 0.25 % IJ SOLN
9.0000 mL | INTRAMUSCULAR | Status: AC | PRN
  Administered 2023-11-12: 9 mL via INTRA_ARTICULAR

## 2023-11-12 MED ORDER — LIDOCAINE HCL 1 % IJ SOLN
5.0000 mL | INTRAMUSCULAR | Status: AC | PRN
  Administered 2023-11-12: 5 mL

## 2023-11-12 MED ORDER — TRIAMCINOLONE ACETONIDE 40 MG/ML IJ SUSP
40.0000 mg | INTRAMUSCULAR | Status: AC | PRN
  Administered 2023-11-12: 40 mg via INTRA_ARTICULAR

## 2023-11-12 NOTE — Progress Notes (Signed)
 Post-Op Visit Note   Patient: David [name]           Date of Birth: 09-16-1950           MRN: [id number] Visit Date: 11/12/2023 PCP: Crecencio Chiquita POUR, FNP   Assessment & Plan:  Chief Complaint: No chief complaint on file.  Visit Diagnoses:  1. S/P reverse total shoulder arthroplasty, right   2. Left shoulder pain, unspecified chronicity   3. Complete tear of left rotator cuff, unspecified whether traumatic     Plan: Patient is a 73 year old male who presents s/p right reverse shoulder arthroplasty on 08/21/2023.  He is just shy of 3 months out.  Overall feels that his right shoulder is doing well.  He can actively get his shoulder to about 90 degrees of forward elevation and 80 degrees of abduction.  He is working with physical therapy in [place].  On exam, incision looks to be healing well with the midportion of the incision having small area of eschar formation that is 4 x 4 mm and it does appear to be healing secondarily without any sign of infection.  Wrist incision looks well-healed.  Axillary nerve intact with deltoid firing.  He has range of motion demonstrating about 30 degrees X rotation, 85 degrees abduction, 130 degrees forward elevation passively.  He does lack a small amount of elbow extension lacking about 3 degrees.  Palpable radial pulse of both upper extremities.  Plan at this time is continue with physical therapy and transition to home exercise program for his right shoulder when he feels appropriate.  Follow-up for final check in 6 weeks really just to take a look at that incision and make sure that is healing.  He will use Bactroban ointment over top of this to apply every day with a Band-Aid until then.  Regarding his left shoulder, he has history of MRI scan demonstrating complete subscapularis tear and partial tearing of the supraspinatus.  Also did have a long head of the biceps rupture around the time of his right shoulder surgery.  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  Range of motion of the left shoulder demonstrates 70 degrees X rotation, 120 degrees abduction, 170 degrees of forward elevation passively and actively.  Pain is overall a little bit better than it was before his bicep tendon rupture but still causes him discomfort that he would like to repeat glenohumeral injection that he had in the past with Dr. Addie.  Has upcoming trip to [place] .  Follow-up in 6 weeks.    Procedure Note  Patient: David [name]             Date of Birth: 09-May-1951           MRN: [id number]             Visit Date: 11/12/2023  Procedures: Visit Diagnoses:  1. Left shoulder pain, unspecified chronicity     Large Joint Inj: L glenohumeral on 11/12/2023 3:26 PM Indications: pain and diagnostic evaluation Details: 22 G 3.5 in needle, ultrasound-guided posterior approach Medications: 5 mL lidocaine  1 %; 9 mL bupivacaine  0.25 %; 40 mg triamcinolone  acetonide 40 MG/ML Outcome: tolerated well, no immediate complications Procedure, treatment alternatives, risks and benefits explained, specific risks discussed. Consent was given by the patient. Immediately prior to procedure a time out was called to verify the correct patient, procedure, equipment, support staff and site/side marked as required. Patient was prepped and draped in the  usual sterile fashion.         Follow-Up Instructions: No follow-ups on file.   Orders:  No orders of the defined types were placed in this encounter.  No orders of the defined types were placed in this encounter.   Imaging: No results found.  PMFS History: Patient Active Problem List   Diagnosis Date Noted   Rotator cuff arthropathy, right 08/21/2023   S/P reverse total shoulder arthroplasty, right 08/21/2023   Sepsis due to undetermined organism (HCC) 08/20/2022   Cervical spondylosis with radiculopathy 02/26/2020   SBO (small bowel obstruction) (HCC) 08/21/2017   Trochanteric bursitis of left  hip 01/18/2017   Lateral epicondylitis, right elbow 01/18/2017   Rheumatoid arthritis (HCC) 03/15/2016   DJD (degenerative joint disease), cervical 03/15/2016   DDD (degenerative disc disease), lumbar 03/15/2016   HTN (hypertension) 03/15/2016   Obesity 03/15/2016   Elevated cholesterol 03/15/2016   Neuralgia 03/15/2016   Malignant melanoma of right side of neck (HCC) 03/15/2016   BBB (bundle branch block) 03/15/2016   High risk medication use 03/15/2016   Osteoarthritis of lumbar spine 03/15/2016   Primary osteoarthritis of both hands 03/15/2016   Primary osteoarthritis of both knees 03/15/2016   Mesenteric adenitis 06/29/2015   Past Medical History:  Diagnosis Date   Arthritis    Balance problem    Barrett's esophagus    BBB (bundle branch block) 03/15/2016   Carpal tunnel syndrome    DDD (degenerative disc disease), lumbar 03/15/2016   Depression    Dysrhythmia    had some tachycardia with steroids   Elevated cholesterol 03/15/2016   Headache(784.0)    Ocular Migraines - takes Ambien  for it   Heart murmur    HTN (hypertension) 03/15/2016   Hyperlipemia    Hypertension    Malignant melanoma of right side of neck (HCC) 03/15/2016   73 years old   Neuralgia 03/15/2016   Ocular migraine    Osteoarthritis    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    RLS (restless legs syndrome)    Sleep apnea    uses CPAP   Snores    Status post gastric banding    Wears glasses    driving    Family History  Problem Relation Age of Onset   Heart disease Mother    Alzheimer's disease Father     Past Surgical History:  Procedure Laterality Date   ANTERIOR CERVICAL DECOMPRESSION/DISCECTOMY FUSION 4 LEVELS N/A 02/26/2020   Procedure: ANTERIOR CERVICAL DECOMPRESSION/DISCECTOMY FUSION, INTERBODY PROSTHESIS, PLATE/SCREWS CERVICAL THREE-FOUR CERVICAL FOUR-FIVE CERVICAL FIVE-SIX- CERVICAL SIX-SEVEN;  Surgeon: Mavis Purchase, MD;  Location: [facility] OR;  Service: Neurosurgery;  Laterality: N/A;   BASAL CELL CARCINOMA EXCISION Right    right temple area   CARPAL TUNNEL RELEASE Right 09/26/2012   Procedure: CARPAL TUNNEL RELEASE;  Surgeon: Franky JONELLE Curia, MD;  Location: [facility];  Service: Orthopedics;  Laterality: Right;   CARPAL TUNNEL RELEASE Left 10/24/2012   Procedure: CARPAL TUNNEL RELEASE;  Surgeon: Franky JONELLE Curia, MD;  Location: [facility];  Service: Orthopedics;  Laterality: Left;   CIRCUMCISION     as a baby   EXCISION MELANOMA WITH SENTINEL LYMPH NODE BIOPSY  1984   rt neck with mulpipal nodes and some muscle excised rt neck-shoulder   KNEE ARTHROPLASTY     knee replacement     LAPAROSCOPIC GASTRIC BANDING  03/15/2009   LAPAROSCOPIC  GASTRIC BANDING     MUSCLE BIOPSY  2004   rt thigh to r/o myocitis   nerve conduction velosity test     REVERSE SHOULDER ARTHROPLASTY Right 08/21/2023   Procedure: ARTHROPLASTY, SHOULDER, TOTAL, REVERSE;  Surgeon: Addie Cordella Hamilton, MD;  Location: [facility] OR;  Service: Orthopedics;  Laterality: Right;  RIGHT REVERSE SHOULDER ARTHROPLASTY   SHOULDER SURGERY Right 12/2021   TONSILLECTOMY     TOTAL KNEE ARTHROPLASTY Bilateral 12/05/2010   TRIGGER FINGER RELEASE Right 09/26/2012   Procedure: RELEASE TRIGGER FINGER/A-1 PULLEY RING AND SMALL ;  Surgeon: Franky JONELLE Curia, MD;  Location: [facility];  Service: Orthopedics;  Laterality: Right;   TRIGGER FINGER RELEASE Left 10/24/2012   Procedure: RELEASE TRIGGER FINGER/A-1 PULLEY LEFT MIDDLE AND LEFT RING;  Surgeon: Franky JONELLE Curia, MD;  Location: [facility];  Service: Orthopedics;  Laterality: Left;   UPPER GI ENDOSCOPY     Social History   Occupational History   Not on file  Tobacco Use   Smoking status: Former    Current packs/day: 0.00    Average packs/day: 1 pack/day for 30.0 years (30.0 ttl pk-yrs)    Types: Cigarettes    Start date: 09/24/1971    Quit date:  09/23/2001    Years since quitting: 22.1    Passive exposure: Never   Smokeless tobacco: Never  Vaping Use   Vaping status: Never Used  Substance and Sexual Activity   Alcohol use: Yes    Comment: maybe one drink once a month   Drug use: No   Sexual activity: Not on file

## 2023-11-12 NOTE — Therapy (Signed)
 OUTPATIENT PHYSICAL THERAPY SHOULDER TREATMENT      Patient Name: David [name] MRN: [id number] DOB:06/23/1950, 73 y.o., male Today's Date: 11/13/2023  END OF SESSION:  PT End of Session - 11/13/23 1230     Visit Number 14    Date for PT Re-Evaluation 12/21/23    Authorization Type BC[employer] MCR    Progress Note Due on Visit 21    PT Start Time 1150    PT Stop Time 1230    PT Time Calculation (min) 40 min    Activity Tolerance Patient tolerated treatment well    Behavior During Therapy WFL for tasks assessed/performed            Past Medical History:  Diagnosis Date   Arthritis    Balance problem    Barrett's esophagus    BBB (bundle branch block) 03/15/2016   Carpal tunnel syndrome    DDD (degenerative disc disease), lumbar 03/15/2016   Depression    Dysrhythmia    had some tachycardia with steroids   Elevated cholesterol 03/15/2016   Headache(784.0)    Ocular Migraines - takes Ambien  for it   Heart murmur    HTN (hypertension) 03/15/2016   Hyperlipemia    Hypertension    Malignant melanoma of right side of neck (HCC) 03/15/2016   73 years old   Neuralgia 03/15/2016   Ocular migraine    Osteoarthritis    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    RLS (restless legs syndrome)    Sleep apnea    uses CPAP   Snores    Status post gastric banding    Wears glasses    driving   Past Surgical History:  Procedure Laterality Date   ANTERIOR CERVICAL DECOMPRESSION/DISCECTOMY FUSION 4 LEVELS N/A 02/26/2020   Procedure: ANTERIOR CERVICAL DECOMPRESSION/DISCECTOMY FUSION, INTERBODY PROSTHESIS, PLATE/SCREWS CERVICAL THREE-FOUR CERVICAL FOUR-FIVE CERVICAL FIVE-SIX- CERVICAL SIX-SEVEN;  Surgeon: Mavis Purchase, MD;  Location: [facility] OR;  Service: Neurosurgery;  Laterality: N/A;   BASAL CELL CARCINOMA EXCISION Right    right temple area   CARPAL TUNNEL RELEASE Right 09/26/2012   Procedure: CARPAL TUNNEL  RELEASE;  Surgeon: Franky JONELLE Curia, MD;  Location: [facility];  Service: Orthopedics;  Laterality: Right;   CARPAL TUNNEL RELEASE Left 10/24/2012   Procedure: CARPAL TUNNEL RELEASE;  Surgeon: Franky JONELLE Curia, MD;  Location: [facility];  Service: Orthopedics;  Laterality: Left;   CIRCUMCISION     as a baby   EXCISION MELANOMA WITH SENTINEL LYMPH NODE BIOPSY  1984   rt neck with mulpipal nodes and some muscle excised rt neck-shoulder   KNEE ARTHROPLASTY     knee replacement     LAPAROSCOPIC GASTRIC BANDING  03/15/2009   LAPAROSCOPIC GASTRIC BANDING     MUSCLE BIOPSY  2004   rt thigh to r/o myocitis   nerve conduction velosity test     REVERSE SHOULDER ARTHROPLASTY Right 08/21/2023   Procedure: ARTHROPLASTY, SHOULDER, TOTAL, REVERSE;  Surgeon: Addie Cordella Hamilton, MD;  Location: [facility] OR;  Service: Orthopedics;  Laterality: Right;  RIGHT REVERSE SHOULDER ARTHROPLASTY   SHOULDER SURGERY Right 12/2021   TONSILLECTOMY     TOTAL KNEE ARTHROPLASTY Bilateral 12/05/2010   TRIGGER FINGER RELEASE Right 09/26/2012   Procedure: RELEASE TRIGGER FINGER/A-1 PULLEY RING AND SMALL ;  Surgeon: Franky JONELLE Curia, MD;  Location: [facility];  Service: Orthopedics;  Laterality: Right;   TRIGGER  FINGER RELEASE Left 10/24/2012   Procedure: RELEASE TRIGGER FINGER/A-1 PULLEY LEFT MIDDLE AND LEFT RING;  Surgeon: Franky JONELLE Curia, MD;  Location: [facility];  Service: Orthopedics;  Laterality: Left;   UPPER GI ENDOSCOPY     Patient Active Problem List   Diagnosis Date Noted   Rotator cuff arthropathy, right 08/21/2023   S/P reverse total shoulder arthroplasty, right 08/21/2023   Sepsis due to undetermined organism (HCC) 08/20/2022   Cervical spondylosis with radiculopathy 02/26/2020   SBO (small bowel obstruction) (HCC) 08/21/2017   Trochanteric bursitis of left hip 01/18/2017   Lateral epicondylitis, right elbow 01/18/2017   Rheumatoid arthritis (HCC) 03/15/2016    DJD (degenerative joint disease), cervical 03/15/2016   DDD (degenerative disc disease), lumbar 03/15/2016   HTN (hypertension) 03/15/2016   Obesity 03/15/2016   Elevated cholesterol 03/15/2016   Neuralgia 03/15/2016   Malignant melanoma of right side of neck (HCC) 03/15/2016   BBB (bundle branch block) 03/15/2016   High risk medication use 03/15/2016   Osteoarthritis of lumbar spine 03/15/2016   Primary osteoarthritis of both hands 03/15/2016   Primary osteoarthritis of both knees 03/15/2016   Mesenteric adenitis 06/29/2015    PCP: Stamey, Chiquita POUR, FNP   REFERRING PROVIDER: Shirly Carlin CROME, PA-C   REFERRING DIAG: [phone] (ICD-10-CM) - S/P reverse total shoulder arthroplasty, Right (With subscapularis repair)  THERAPY DIAG:  Stiffness of right shoulder, not elsewhere classified  Muscle weakness (generalized)  Unsteadiness on feet  Pain in right arm  Rationale for Evaluation and Treatment: Rehabilitation  ONSET DATE: 08/21/23 DOS  SUBJECTIVE:                                                                                                                                                                                      SUBJECTIVE STATEMENT: The left shoulder had injection yesterday.   11/13/23: 13 weeks post op Hand dominance: Right  PERTINENT HISTORY: H/o falls,  ACDF, B TKA, HTN, h/o malignant melanoma in R neck affecting R trapezius muscle and R UE ROM  PAIN:  Are you having pain? No  PRECAUTIONS: Shoulder As of 10/03/23: Able to lift up to but not beyond 20#.  At Eval: Okay to very lightly lift with the operative extremity but no lifting anything heavier than a coffee cup or cell phone. Start physical therapy to focus on passive range of motion and active range of motion with deltoid isometrics. Do not want to externally rotate past 30 degrees to protect subscapularis repair.   RED FLAGS: None   WEIGHT BEARING RESTRICTIONS: Yes R shoulder  FALLS:  Has  patient fallen in last 6 months? Yes. Number of falls  4 tripping over things in cluttered garage  LIVING ENVIRONMENT: Lives with: lives with their spouse Lives in: House/apartment Stairs: Yes: Internal: 15 steps; on left going up and bilateral but cannot reach both and External: 3 steps; no railing on deck Has following equipment at home: Single point cane and Crutches  OCCUPATION: retired  PLOF: Independent  PATIENT GOALS:to be able to sleep in bed on side without pain, currently on couch or in bed on back  NEXT MD VISIT: Early June Billi)  OBJECTIVE:  Note: Objective measures were completed at Evaluation unless otherwise noted.  DIAGNOSTIC FINDINGS:  none  PATIENT SURVEYS:  Quick Dash  72.7 / 100 = 72.7 %  11/06/23  36.4 / 100 = 36.4 %  COGNITION: Overall cognitive status: Within functional limits for tasks assessed     SENSATION: Mild pull in post distal/lateral  wrist  POSTURE: Rounded shoulders  UPPER EXTREMITY ROM:   A/P ROM Right eval Left eval Right 5/8//25 Right 5/27 Right 10/26/23 Right  6/10  Shoulder flexion 130/142   150 active supine 70 active supine 150 active supine  Shoulder extension        Shoulder abduction 100 P   118 active 111 active supine   Shoulder adduction        Shoulder internal rotation    65    Shoulder external rotation 30/30   50    Elbow flexion        Elbow extension -23  -16 -11/0 -2 AAROM   Wrist flexion WFL       Wrist extension        Wrist ulnar deviation        Wrist radial deviation        Wrist pronation        Wrist supination        (Blank rows = not tested) R wrist limited since melanoma surgery  A/P ROM Right 6/12 Right 6/17  Shoulder flexion 150 supine 153 supine 79 deg sitting  Shoulder extension    Shoulder abduction  110  Shoulder adduction    Shoulder internal rotation  48  Shoulder external rotation  70  Elbow flexion    Elbow extension    Wrist flexion WFL   Wrist extension    Wrist ulnar  deviation    Wrist radial deviation    Wrist pronation    Wrist supination        UPPER EXTREMITY MMT: NT due to surgery  PALPATION:  TTP in ant and post shoulder, wrist extensors   [facility] Adult PT Treatment:                                                DATE: 11/13/2023 Therapeutic Activities:   Therapeutic Exercise: On red swiss ball: Bent over shoulder ext to neutral 2# x 20;  triceps ext with 2# wt x 20 Green shoulder ext 2x10 Blue Row 3x10 Standing shoulder flex and ABD x 10 ea  Standing wall slide flexion with 2#AW x 15 challenging Standing flex red (punch) x 20 Standing IR red x 20 Neuromuscular re-ed: S/L flexion 2# x 16, 3# x 20 S/L ER x 20 (slightly rolled backward) S/L ABD x 15, then with 1# wt x 15 S/L horizontal ABD x 15, 1# x 15 Supine shoulder flexion to 90 deg x 20 Flex with  2# wt to 90 R only with scap retraction - use support under arm x 20 Supine ER with 2# wt on towel  with PT providing stop for end range x 20  Scaption x 20   [facility] Adult PT Treatment:                                                DATE: 11/06/2023 Therapeutic Activities:   Therapeutic Exercise: On green swiss ball: Bent over shoulder ext to neutral 1# x 20;  triceps ext with 2# wt x 20; horizontal ABD 0# x 20, row x 20 no wt  Neuromuscular re-ed: Supine:  S/L flexion 2# 2 x 10 Shoulder flexion 2# and 3# popping in shoulder Flex with 2# wt to 90 R only with scap retraction - use support under arm popping in pectoralis which is controlled when PT applied overpressure. Shoulder flexion 3#AW on dowel x 20 Scaption 1# to 90 deg - use support under arm  [facility] Adult PT Treatment:                                                DATE: 10/30/2023 Therapeutic Exercise: Seated with noodle behind back: Scap squeezes Standing with back against noodle for postural feedback: Shoulder flexion to 90 degrees Shoulder scaption to 90 degrees -  Rolling ball up/down wall for shoulder flexion stretch   Seated bicep curls (Rt) + 5#DB - upper arm supported with folded 3# wt Seated and standing triceps ext with 2# wt (standing better) Manual Therapy: PROM R elbow and shoulder flex Neuromuscular re-ed: Supine:  Scap punches + 3#dowel Shoulder flexion + 2#dowel to 90 degrees Flex with 2# wt to 90 R only with scap retraction - use support under arm Scatpion 1# to 90 deg - use support under arm Supine + 2#MB: Small crosses x20 Circles CW/CCW x15 each Alphabet   [facility] Adult PT Treatment:                                                DATE: 10/26/2023 Therapeutic Exercise: Seated with noodle behind back: Scap squeezes + Quick Dash intake Standing with back against noodle for postural feedback: Shoulder flexion to 90 degrees Shoulder scaption to 90 degrees Rolling ball up/down wall for shoulder flexion stretch  Seated bicep curls (Rt) + 5#DB Shoulder/elbow measurements (see above) Manual Therapy: PROM R elbow and supination/pronation (supine) Neuromuscular re-ed: Supine:  Scap punches + 3#dowel Shoulder flexion + 2#dowel to 90 degrees Supine + 2#MB: Small crosses x20 Circles CW/CCW x15 each Alphabet Reactive isometric rows step out/in + GTB   [facility] Adult PT Treatment:                                                DATE: 10/23/2023 Therapeutic Exercise: Standing with back against noodle for postural feedback: Scap retraction Shoulder flexion to 90 degrees Shoulder scaption to 90 degrees Rolling ball up/down wall for shoulder flexion stretch  Seated bicep curls (Rt) + 5#DB Neuromuscular re-ed: Reactive isometric rows step out/in + GTB Supine flexion to 90 deg with roller x 20 --> focus on scapula stability Supine scap punches + 3#DB (difficult) Standing scap punches with RTB & tactile cues Supine + 2#MB: Circles CW/CCW x15 each ABCs  Small crosses x20                                                                         PATIENT EDUCATION: Education details: PT eval  findings, anticipated POC, initial HEP, and reviewed precautions and limitations  Person educated: Patient Education method: Explanation, Demonstration, and Handouts Education comprehension: verbalized understanding and returned demonstration  HOME EXERCISE PROGRAM: Access Code: [email or web address] URL: [email or web address] Date: 09/18/2023 Prepared by: Darice Conine  Exercises - Seated Shoulder External Rotation AAROM with Cane and Hand in Neutral  - 1-2 x daily - 3 x weekly - 2 sets - 10 reps - Supine Shoulder Flexion Extension AAROM with Dowel  - 2 x daily - 7 x weekly - 1-3 sets - 10 reps - Wrist Flexion Extension AROM with Fingers Curled and Palm Down  - 2 x daily - 7 x weekly - 2 sets - 10 reps - Forearm Pronation and Supination With Arm Supported  - 2 x daily - 7 x weekly - 2 sets - 10 reps - Seated Scapular Retraction  - 1 x daily - 7 x weekly - 3 sets - 10 reps - Isometric Shoulder Abduction at Wall  - 1 x daily - 7 x weekly - 1 sets - 10 reps - 3 seconds hold - Isometric Shoulder Extension at Wall  - 1 x daily - 7 x weekly - 1 sets - 10 reps - 3 seconds hold - Isometric Shoulder Flexion at Wall  - 1 x daily - 7 x weekly - 1 sets - 10 reps - 3 seconds hold  ASSESSMENT:  CLINICAL IMPRESSION: Patient did much better today with strengthening. He experienced one incidence of pain with supine ER that was quick to resolve. Patient is still weak in flex and ABD against gravity. Challenged by wall slides with ankle weight. Patient has one more visit before leaving on vacation.     OBJECTIVE IMPAIRMENTS: decreased activity tolerance, decreased balance, decreased ROM, decreased strength, increased edema, increased muscle spasms, impaired flexibility, impaired sensation, impaired UE functional use, postural dysfunction, obesity, and pain.    GOALS: Goals reviewed with patient? Yes  SHORT TERM GOALS: Target date: 10/11/2023   Patient will be independent with initial HEP.   Baseline:  Goal status: IN PROGRESS  2. Balance screen performed and appropriate goals set if indicated.  Baseline:  10/01/23: TUG - 8 sec; 5xSTS - 12 sec Goal status: MET    LONG TERM GOALS: Target date: 12/21/23  Patient will be independent with advanced/ongoing HEP to improve outcomes and carryover.  Baseline:  Goal status: IN PROGRESS 11/06/23  2.   Decreased Quick Dash by >= 10 points demonstrating improved functional ability.  Baseline: 72.7 10/26/23: 25.0 Goal status: MET 10/26/23  3.  Patient to improve R shoulder AROM to [facility] without pain provocation to allow for increased ease of ADLs.  Baseline:  Goal status: IN PROGRESS 11/06/23  4.  Patient will demonstrate improved UE strength to 4+/5 to perform ADLS  Baseline:  Goal status: IN PROGRESS 11/06/23  5. Patient to report no pain in R forearm with ADLs.  Baseline:  Goal status: MET 11/06/23  6.  Patient will score >19/24 on the DGI to decrease fall risk.   Baseline: TBD Goal status: goal d/c   PLAN:  PT FREQUENCY: 2x/week  PT DURATION: 6 weeks  PLANNED INTERVENTIONS: 97164- PT Re-evaluation, 97110-Therapeutic exercises, 97530- Therapeutic activity, 97112- Neuromuscular re-education, 97535- Self Care, [id number]- Manual therapy, [id number]- Electrical stimulation (unattended), 97016- Vasopneumatic device, 97033- Ionotophoresis 4mg /ml Dexamethasone , Patient/Family education, Balance training, Taping, Dry Needling, Joint mobilization, Spinal mobilization, Cryotherapy, and Moist heat  PLAN FOR NEXT SESSION: one more visit before vacation. Note: Prone shoulder strength affected by Melanoma surgery so limited in ability: progress scapular strength per protocol. .   Pt able to lift up to 20# per MD note.   Mliss Cummins, PT 11/13/23, 12:38 PM

## 2023-11-13 ENCOUNTER — Ambulatory Visit: Admitting: Physical Therapy

## 2023-11-13 ENCOUNTER — Encounter: Payer: Self-pay | Admitting: Physical Therapy

## 2023-11-13 DIAGNOSIS — R600 Localized edema: Secondary | ICD-10-CM | POA: Diagnosis not present

## 2023-11-13 DIAGNOSIS — M6281 Muscle weakness (generalized): Secondary | ICD-10-CM | POA: Diagnosis not present

## 2023-11-13 DIAGNOSIS — M25611 Stiffness of right shoulder, not elsewhere classified: Secondary | ICD-10-CM | POA: Diagnosis not present

## 2023-11-13 DIAGNOSIS — R2681 Unsteadiness on feet: Secondary | ICD-10-CM | POA: Diagnosis not present

## 2023-11-13 DIAGNOSIS — R262 Difficulty in walking, not elsewhere classified: Secondary | ICD-10-CM | POA: Diagnosis not present

## 2023-11-13 DIAGNOSIS — M79601 Pain in right arm: Secondary | ICD-10-CM | POA: Diagnosis not present

## 2023-11-14 ENCOUNTER — Ambulatory Visit: Admitting: Surgical

## 2023-11-15 ENCOUNTER — Encounter: Payer: Self-pay | Admitting: Physical Therapy

## 2023-11-15 ENCOUNTER — Ambulatory Visit: Admitting: Physical Therapy

## 2023-11-15 DIAGNOSIS — M25611 Stiffness of right shoulder, not elsewhere classified: Secondary | ICD-10-CM

## 2023-11-15 DIAGNOSIS — R2681 Unsteadiness on feet: Secondary | ICD-10-CM | POA: Diagnosis not present

## 2023-11-15 DIAGNOSIS — M6281 Muscle weakness (generalized): Secondary | ICD-10-CM | POA: Diagnosis not present

## 2023-11-15 DIAGNOSIS — M79601 Pain in right arm: Secondary | ICD-10-CM

## 2023-11-15 DIAGNOSIS — R600 Localized edema: Secondary | ICD-10-CM | POA: Diagnosis not present

## 2023-11-15 DIAGNOSIS — R262 Difficulty in walking, not elsewhere classified: Secondary | ICD-10-CM | POA: Diagnosis not present

## 2023-11-15 NOTE — Therapy (Signed)
 OUTPATIENT PHYSICAL THERAPY SHOULDER TREATMENT      Patient Name: David [name] MRN: [id number] DOB:06/18/1950, 73 y.o., male Today's Date: 11/15/2023  END OF SESSION:  PT End of Session - 11/15/23 0847     Visit Number 15    Date for PT Re-Evaluation 12/21/23    Authorization Type [employer] MCR    Authorization - Visit Number 15    Progress Note Due on Visit 21    PT Start Time 0847    PT Stop Time 0927    PT Time Calculation (min) 40 min    Activity Tolerance Patient tolerated treatment well    Behavior During Therapy [facility] for tasks assessed/performed             Past Medical History:  Diagnosis Date   Arthritis    Balance problem    Barrett's esophagus    BBB (bundle branch block) 03/15/2016   Carpal tunnel syndrome    DDD (degenerative disc disease), lumbar 03/15/2016   Depression    Dysrhythmia    had some tachycardia with steroids   Elevated cholesterol 03/15/2016   Headache(784.0)    Ocular Migraines - takes Ambien  for it   Heart murmur    HTN (hypertension) 03/15/2016   Hyperlipemia    Hypertension    Malignant melanoma of right side of neck (HCC) 03/15/2016   73 years old   Neuralgia 03/15/2016   Ocular migraine    Osteoarthritis    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    RLS (restless legs syndrome)    Sleep apnea    uses CPAP   Snores    Status post gastric banding    Wears glasses    driving   Past Surgical History:  Procedure Laterality Date   ANTERIOR CERVICAL DECOMPRESSION/DISCECTOMY FUSION 4 LEVELS N/A 02/26/2020   Procedure: ANTERIOR CERVICAL DECOMPRESSION/DISCECTOMY FUSION, INTERBODY PROSTHESIS, PLATE/SCREWS CERVICAL THREE-FOUR CERVICAL FOUR-FIVE CERVICAL FIVE-SIX- CERVICAL SIX-SEVEN;  Surgeon: Mavis Purchase, MD;  Location: [facility] OR;  Service: Neurosurgery;  Laterality: N/A;   BASAL CELL CARCINOMA EXCISION Right    right temple area   CARPAL TUNNEL RELEASE Right  09/26/2012   Procedure: CARPAL TUNNEL RELEASE;  Surgeon: Franky JONELLE Curia, MD;  Location: [facility];  Service: Orthopedics;  Laterality: Right;   CARPAL TUNNEL RELEASE Left 10/24/2012   Procedure: CARPAL TUNNEL RELEASE;  Surgeon: Franky JONELLE Curia, MD;  Location: [facility];  Service: Orthopedics;  Laterality: Left;   CIRCUMCISION     as a baby   EXCISION MELANOMA WITH SENTINEL LYMPH NODE BIOPSY  1984   rt neck with mulpipal nodes and some muscle excised rt neck-shoulder   KNEE ARTHROPLASTY     knee replacement     LAPAROSCOPIC GASTRIC BANDING  03/15/2009   LAPAROSCOPIC GASTRIC BANDING     MUSCLE BIOPSY  2004   rt thigh to r/o myocitis   nerve conduction velosity test     REVERSE SHOULDER ARTHROPLASTY Right 08/21/2023   Procedure: ARTHROPLASTY, SHOULDER, TOTAL, REVERSE;  Surgeon: Addie Cordella Hamilton, MD;  Location: [facility] OR;  Service: Orthopedics;  Laterality: Right;  RIGHT REVERSE SHOULDER ARTHROPLASTY   SHOULDER SURGERY Right 12/2021   TONSILLECTOMY     TOTAL KNEE ARTHROPLASTY Bilateral 12/05/2010   TRIGGER FINGER RELEASE Right 09/26/2012   Procedure: RELEASE TRIGGER FINGER/A-1 PULLEY RING AND SMALL ;  Surgeon: Franky JONELLE Curia, MD;  Location: [facility];  Service: Orthopedics;  Laterality: Right;   TRIGGER FINGER RELEASE Left 10/24/2012   Procedure: RELEASE TRIGGER FINGER/A-1 PULLEY LEFT MIDDLE AND LEFT RING;  Surgeon: Franky JONELLE Curia, MD;  Location: [facility];  Service: Orthopedics;  Laterality: Left;   UPPER GI ENDOSCOPY     Patient Active Problem List   Diagnosis Date Noted   Rotator cuff arthropathy, right 08/21/2023   S/P reverse total shoulder arthroplasty, right 08/21/2023   Sepsis due to undetermined organism (HCC) 08/20/2022   Cervical spondylosis with radiculopathy 02/26/2020   SBO (small bowel obstruction) (HCC) 08/21/2017   Trochanteric bursitis of left hip 01/18/2017   Lateral epicondylitis, right elbow 01/18/2017    Rheumatoid arthritis (HCC) 03/15/2016   DJD (degenerative joint disease), cervical 03/15/2016   DDD (degenerative disc disease), lumbar 03/15/2016   HTN (hypertension) 03/15/2016   Obesity 03/15/2016   Elevated cholesterol 03/15/2016   Neuralgia 03/15/2016   Malignant melanoma of right side of neck (HCC) 03/15/2016   BBB (bundle branch block) 03/15/2016   High risk medication use 03/15/2016   Osteoarthritis of lumbar spine 03/15/2016   Primary osteoarthritis of both hands 03/15/2016   Primary osteoarthritis of both knees 03/15/2016   Mesenteric adenitis 06/29/2015    PCP: Stamey, Chiquita POUR, FNP   REFERRING PROVIDER: Shirly Carlin CROME, PA-C   REFERRING DIAG: [phone] (ICD-10-CM) - S/P reverse total shoulder arthroplasty, Right (With subscapularis repair)  THERAPY DIAG:  Stiffness of right shoulder, not elsewhere classified  Muscle weakness (generalized)  Unsteadiness on feet  Pain in right arm  Rationale for Evaluation and Treatment: Rehabilitation  ONSET DATE: 08/21/23 DOS  SUBJECTIVE:                                                                                                                                                                                      SUBJECTIVE STATEMENT: My left shoulder hurt last night but it's better today   11/13/23: 13 weeks post op Hand dominance: Right  PERTINENT HISTORY: H/o falls,  ACDF, B TKA, HTN, h/o malignant melanoma in R neck affecting R trapezius muscle and R UE ROM  PAIN:  Are you having pain? No  PRECAUTIONS: Shoulder As of 10/03/23: Able to lift up to but not beyond 20#.  At Eval: Okay to very lightly lift with the operative extremity but no lifting anything heavier than a coffee cup or cell phone. Start physical therapy to focus on passive range of motion and active range of motion with deltoid isometrics. Do not want to externally rotate past 30 degrees to protect subscapularis repair.   RED FLAGS: None   WEIGHT  BEARING RESTRICTIONS: Yes R shoulder  FALLS:  Has patient fallen in last 6 months? Yes. Number of falls 4 tripping over things in cluttered garage  LIVING ENVIRONMENT: Lives with: lives with their spouse Lives in: House/apartment Stairs: Yes: Internal: 15 steps; on left going up and bilateral but cannot reach both and External: 3 steps; no railing on deck Has following equipment at home: Single point cane and Crutches  OCCUPATION: retired  PLOF: Independent  PATIENT GOALS:to be able to sleep in bed on side without pain, currently on couch or in bed on back  NEXT MD VISIT: Early June Billi)  OBJECTIVE:  Note: Objective measures were completed at Evaluation unless otherwise noted.  DIAGNOSTIC FINDINGS:  none  PATIENT SURVEYS:  Quick Dash  72.7 / 100 = 72.7 %  11/06/23  36.4 / 100 = 36.4 %  COGNITION: Overall cognitive status: Within functional limits for tasks assessed     SENSATION: Mild pull in post distal/lateral  wrist  POSTURE: Rounded shoulders  UPPER EXTREMITY ROM:   A/P ROM Right eval Left eval Right 5/8//25 Right 5/27 Right 10/26/23 Right  6/10  Shoulder flexion 130/142   150 active supine 70 active supine 150 active supine  Shoulder extension        Shoulder abduction 100 P   118 active 111 active supine   Shoulder adduction        Shoulder internal rotation    65    Shoulder external rotation 30/30   50    Elbow flexion        Elbow extension -23  -16 -11/0 -2 AAROM   Wrist flexion WFL       Wrist extension        Wrist ulnar deviation        Wrist radial deviation        Wrist pronation        Wrist supination        (Blank rows = not tested) R wrist limited since melanoma surgery  A/P ROM Right 6/12 Right 6/17  Shoulder flexion 150 supine 153 supine 79 deg sitting  Shoulder extension    Shoulder abduction  110  Shoulder adduction    Shoulder internal rotation  48  Shoulder external rotation  70  Elbow flexion    Elbow extension     Wrist flexion WFL   Wrist extension    Wrist ulnar deviation    Wrist radial deviation    Wrist pronation    Wrist supination        UPPER EXTREMITY MMT: NT due to surgery  PALPATION:  TTP in ant and post shoulder, wrist extensors   [facility] Adult PT Treatment:                                                DATE: 11/15/2023 Therapeutic Activities:   Therapeutic Exercise: Green shoulder ext 2x10 Blue Row 3x10 Standing shoulder flex and ABD with bent arm  2 x 10 ea  using mirror Standing wall slide flexion with 2#AW 2x 10 challenging Fitter black band x 20 forward flexion Standing flex red (punch) x 20 Standing IR red x 20 Standing ER walkout  x 20  Neuromuscular re-ed: S/L flexion 2# x 10, 3# x 20 S/L ER x 20 (slightly rolled backward) S/L ABD 1# wt x 10 ea palm down and thumb up S/L horizontal ABD 1# 2  x 10 Prone triceps ext 2# x 10  Prone T no wt x 10 Prone row 3# stopped  due to pain Supine shoulder circles 3# Supine shoulder flexion  x 20 Flex with 2# wt to 90 R only with scap retraction - use support under arm x 20 Supine ER with 2# wt on towel  with PT providing stop for end range x 20  Scaption x 20   [facility] Adult PT Treatment:                                                DATE: 11/13/2023 Therapeutic Activities:   Therapeutic Exercise: On red swiss ball: Bent over shoulder ext to neutral 2# x 20;  triceps ext with 2# wt x 20 Green shoulder ext 2x10 Blue Row 3x10 Standing shoulder flex and ABD x 10 ea  Standing wall slide flexion with 2#AW x 15 challenging Standing flex red (punch) x 20 Standing IR red x 20 Neuromuscular re-ed: S/L flexion 2# x 16, 3# x 20 S/L ER x 20 (slightly rolled backward) S/L ABD x 15, then with 1# wt x 15 S/L horizontal ABD x 15, 1# x 15 Supine shoulder flexion to 90 deg x 20 Flex with 2# wt to 90 R only with scap retraction - use support under arm x 20 Supine ER with 2# wt on towel  with PT providing stop for end range x 20   Scaption x 20   [facility] Adult PT Treatment:                                                DATE: 11/06/2023 Therapeutic Activities:   Therapeutic Exercise: On green swiss ball: Bent over shoulder ext to neutral 1# x 20;  triceps ext with 2# wt x 20; horizontal ABD 0# x 20, row x 20 no wt  Neuromuscular re-ed: Supine:  S/L flexion 2# 2 x 10 Shoulder flexion 2# and 3# popping in shoulder Flex with 2# wt to 90 R only with scap retraction - use support under arm popping in pectoralis which is controlled when PT applied overpressure. Shoulder flexion 3#AW on dowel x 20 Scaption 1# to 90 deg - use support under arm  [facility] Adult PT Treatment:                                                DATE: 10/30/2023 Therapeutic Exercise: Seated with noodle behind back: Scap squeezes Standing with back against noodle for postural feedback: Shoulder flexion to 90 degrees Shoulder scaption to 90 degrees -  Rolling ball up/down wall for shoulder flexion stretch  Seated bicep curls (Rt) + 5#DB - upper arm supported with folded 3# wt Seated and standing triceps ext with 2# wt (standing better) Manual Therapy: PROM R elbow and shoulder flex Neuromuscular re-ed: Supine:  Scap punches + 3#dowel Shoulder flexion + 2#dowel to 90 degrees Flex with 2# wt to 90 R only with scap retraction - use support under arm Scatpion 1# to 90 deg - use support under arm Supine + 2#MB: Small crosses x20 Circles CW/CCW  x15 each Alphabet   [facility] Adult PT Treatment:                                                DATE: 10/26/2023 Therapeutic Exercise: Seated with noodle behind back: Scap squeezes + Quick Dash intake Standing with back against noodle for postural feedback: Shoulder flexion to 90 degrees Shoulder scaption to 90 degrees Rolling ball up/down wall for shoulder flexion stretch  Seated bicep curls (Rt) + 5#DB Shoulder/elbow measurements (see above) Manual Therapy: PROM R elbow and supination/pronation  (supine) Neuromuscular re-ed: Supine:  Scap punches + 3#dowel Shoulder flexion + 2#dowel to 90 degrees Supine + 2#MB: Small crosses x20 Circles CW/CCW x15 each Alphabet Reactive isometric rows step out/in + GTB   [facility] Adult PT Treatment:                                                DATE: 10/23/2023 Therapeutic Exercise: Standing with back against noodle for postural feedback: Scap retraction Shoulder flexion to 90 degrees Shoulder scaption to 90 degrees Rolling ball up/down wall for shoulder flexion stretch  Seated bicep curls (Rt) + 5#DB Neuromuscular re-ed: Reactive isometric rows step out/in + GTB Supine flexion to 90 deg with roller x 20 --> focus on scapula stability Supine scap punches + 3#DB (difficult) Standing scap punches with RTB & tactile cues Supine + 2#MB: Circles CW/CCW x15 each ABCs  Small crosses x20                                                                         PATIENT EDUCATION: Education details: PT eval findings, anticipated POC, initial HEP, and reviewed precautions and limitations  Person educated: Patient Education method: Explanation, Demonstration, and Handouts Education comprehension: verbalized understanding and returned demonstration  HOME EXERCISE PROGRAM: Access Code: [email or web address] URL: [email or web address] Date: 11/15/2023 Prepared by: Mliss  Exercises - Seated Shoulder External Rotation AAROM with Cane and Hand in Neutral  - 1-2 x daily - 3 x weekly - 2 sets - 10 reps - Supine Shoulder Flexion Extension AAROM with Dowel  - 2 x daily - 7 x weekly - 1-3 sets - 10 reps - Wrist Flexion Extension AROM with Fingers Curled and Palm Down  - 2 x daily - 7 x weekly - 2 sets - 10 reps - Forearm Pronation and Supination With Arm Supported  - 2 x daily - 7 x weekly - 2 sets - 10 reps - Seated Scapular Retraction  - 1 x daily - 7 x weekly - 3 sets - 10 reps - Isometric Shoulder Abduction at Wall  - 1 x daily - 7 x weekly - 1  sets - 10 reps - 3 seconds hold - Isometric Shoulder Extension at Wall  - 1 x daily - 7 x weekly - 1 sets - 10 reps - 3 seconds hold - Isometric Shoulder Flexion at Wall  - 1 x daily - 7 x weekly - 1 sets -  10 reps - 3 seconds hold - Sidelying Shoulder Flexion 15 Degrees (Mirrored)  - 1 x daily - 3-4 x weekly - 1-3 sets - 10 reps - Sidelying Shoulder Horizontal Abduction  - 1 x daily - 3-4 x weekly - 1-3 sets - 10 reps - Prone Elbow Extension with Dumbbell  - 1 x daily - 3 x weekly - 1-3 sets - 10 reps - Prone Shoulder Extension - Single Arm  - 1 x daily - 3 x weekly - 2 sets - 10 reps - Prone Single Arm Shoulder Horizontal Abduction with Scapular Retraction and Palm Down (Mirrored)  - 1 x daily - 3 x weekly - 1-3 sets - 10 reps  ASSESSMENT:  CLINICAL IMPRESSION: Patient tolerated all exercises without pain today except prone row. This may have been due to no support under shoulder. HEP updated for patient. He demonstrated much improved form with shoulder flexion using the mirror for VC.      OBJECTIVE IMPAIRMENTS: decreased activity tolerance, decreased balance, decreased ROM, decreased strength, increased edema, increased muscle spasms, impaired flexibility, impaired sensation, impaired UE functional use, postural dysfunction, obesity, and pain.    GOALS: Goals reviewed with patient? Yes  SHORT TERM GOALS: Target date: 10/11/2023   Patient will be independent with initial HEP.  Baseline:  Goal status: IN PROGRESS  2. Balance screen performed and appropriate goals set if indicated.  Baseline:  10/01/23: TUG - 8 sec; 5xSTS - 12 sec Goal status: MET    LONG TERM GOALS: Target date: 12/21/23  Patient will be independent with advanced/ongoing HEP to improve outcomes and carryover.  Baseline:  Goal status: IN PROGRESS 11/06/23  2.   Decreased Quick Dash by >= 10 points demonstrating improved functional ability.  Baseline: 72.7 10/26/23: 25.0 Goal status: MET 10/26/23  3.  Patient to  improve R shoulder AROM to [facility] without pain provocation to allow for increased ease of ADLs.  Baseline:  Goal status: IN PROGRESS 11/06/23  4.  Patient will demonstrate improved UE strength to 4+/5 to perform ADLS  Baseline:  Goal status: IN PROGRESS 11/06/23  5. Patient to report no pain in R forearm with ADLs.  Baseline:  Goal status: MET 11/06/23  6.  Patient will score >19/24 on the DGI to decrease fall risk.   Baseline: TBD Goal status: goal d/c   PLAN:  PT FREQUENCY: 2x/week  PT DURATION: 6 weeks  PLANNED INTERVENTIONS: 97164- PT Re-evaluation, 97110-Therapeutic exercises, 97530- Therapeutic activity, [id number]- Neuromuscular re-education, 97535- Self Care, [id number]- Manual therapy, [id number]- Electrical stimulation (unattended), 97016- Vasopneumatic device, 97033- Ionotophoresis 4mg /ml Dexamethasone , Patient/Family education, Balance training, Taping, Dry Needling, Joint mobilization, Spinal mobilization, Cryotherapy, and Moist heat  PLAN FOR NEXT SESSION: continue strengthening per tolerance. Note: Prone shoulder strength affected by Melanoma surgery so limited in ability: progress scapular strength per protocol. .   Pt able to lift up to 20# per MD note.   Mliss Cummins, PT 11/15/23, 9:32 AM

## 2023-11-28 ENCOUNTER — Other Ambulatory Visit: Payer: Self-pay | Admitting: Physician Assistant

## 2023-11-28 DIAGNOSIS — M0609 Rheumatoid arthritis without rheumatoid factor, multiple sites: Secondary | ICD-10-CM

## 2023-11-28 NOTE — Telephone Encounter (Signed)
 Last Fill: 09/10/2023  Labs: 09/05/2023 Glucose is 151. Rest of CMP WNL.   RBC count and hemoglobin are borderline low. Rest of CBC WNL.     Next Visit: 04/09/2024  Last Visit: 11/08/2023  DX: Rheumatoid arthritis of multiple sites with negative rheumatoid factor   Current Dose per office note 11/08/2023: Methotrexate  1.0 ml sq injections once weekly   Okay to refill Methotrexate ?

## 2023-11-29 ENCOUNTER — Encounter: Admitting: Physical Therapy

## 2023-11-30 ENCOUNTER — Ambulatory Visit: Attending: Surgical

## 2023-11-30 DIAGNOSIS — M25611 Stiffness of right shoulder, not elsewhere classified: Secondary | ICD-10-CM | POA: Diagnosis not present

## 2023-11-30 DIAGNOSIS — R2681 Unsteadiness on feet: Secondary | ICD-10-CM | POA: Insufficient documentation

## 2023-11-30 DIAGNOSIS — R600 Localized edema: Secondary | ICD-10-CM | POA: Insufficient documentation

## 2023-11-30 DIAGNOSIS — M6281 Muscle weakness (generalized): Secondary | ICD-10-CM | POA: Insufficient documentation

## 2023-11-30 DIAGNOSIS — M79601 Pain in right arm: Secondary | ICD-10-CM | POA: Diagnosis not present

## 2023-11-30 NOTE — Therapy (Signed)
 OUTPATIENT PHYSICAL THERAPY SHOULDER TREATMENT      Patient Name: David [name] MRN: [id number] DOB:20-May-1951, 73 y.o., male Today's Date: 11/30/2023  END OF SESSION:  PT End of Session - 11/30/23 1019     Visit Number 16    Date for PT Re-Evaluation 12/21/23    Authorization Type BC[employer] MCR    Progress Note Due on Visit 21    PT Start Time 1020    PT Stop Time 1105    PT Time Calculation (min) 45 min    Activity Tolerance Patient tolerated treatment well    Behavior During Therapy WFL for tasks assessed/performed         Past Medical History:  Diagnosis Date   Arthritis    Balance problem    Barrett's esophagus    BBB (bundle branch block) 03/15/2016   Carpal tunnel syndrome    DDD (degenerative disc disease), lumbar 03/15/2016   Depression    Dysrhythmia    had some tachycardia with steroids   Elevated cholesterol 03/15/2016   Headache(784.0)    Ocular Migraines - takes Ambien  for it   Heart murmur    HTN (hypertension) 03/15/2016   Hyperlipemia    Hypertension    Malignant melanoma of right side of neck (HCC) 03/15/2016   73 years old   Neuralgia 03/15/2016   Ocular migraine    Osteoarthritis    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    RLS (restless legs syndrome)    Sleep apnea    uses CPAP   Snores    Status post gastric banding    Wears glasses    driving   Past Surgical History:  Procedure Laterality Date   ANTERIOR CERVICAL DECOMPRESSION/DISCECTOMY FUSION 4 LEVELS N/A 02/26/2020   Procedure: ANTERIOR CERVICAL DECOMPRESSION/DISCECTOMY FUSION, INTERBODY PROSTHESIS, PLATE/SCREWS CERVICAL THREE-FOUR CERVICAL FOUR-FIVE CERVICAL FIVE-SIX- CERVICAL SIX-SEVEN;  Surgeon: Mavis Purchase, MD;  Location: [facility] OR;  Service: Neurosurgery;  Laterality: N/A;   BASAL CELL CARCINOMA EXCISION Right    right temple area   CARPAL TUNNEL RELEASE Right 09/26/2012   Procedure: CARPAL TUNNEL  RELEASE;  Surgeon: Franky JONELLE Curia, MD;  Location: [facility];  Service: Orthopedics;  Laterality: Right;   CARPAL TUNNEL RELEASE Left 10/24/2012   Procedure: CARPAL TUNNEL RELEASE;  Surgeon: Franky JONELLE Curia, MD;  Location: [facility];  Service: Orthopedics;  Laterality: Left;   CIRCUMCISION     as a baby   EXCISION MELANOMA WITH SENTINEL LYMPH NODE BIOPSY  1984   rt neck with mulpipal nodes and some muscle excised rt neck-shoulder   KNEE ARTHROPLASTY     knee replacement     LAPAROSCOPIC GASTRIC BANDING  03/15/2009   LAPAROSCOPIC GASTRIC BANDING     MUSCLE BIOPSY  2004   rt thigh to r/o myocitis   nerve conduction velosity test     REVERSE SHOULDER ARTHROPLASTY Right 08/21/2023   Procedure: ARTHROPLASTY, SHOULDER, TOTAL, REVERSE;  Surgeon: Addie Cordella Hamilton, MD;  Location: [facility] OR;  Service: Orthopedics;  Laterality: Right;  RIGHT REVERSE SHOULDER ARTHROPLASTY   SHOULDER SURGERY Right 12/2021   TONSILLECTOMY     TOTAL KNEE ARTHROPLASTY Bilateral 12/05/2010   TRIGGER FINGER RELEASE Right 09/26/2012   Procedure: RELEASE TRIGGER FINGER/A-1 PULLEY RING AND SMALL ;  Surgeon: Franky JONELLE Curia, MD;  Location: [facility];  Service: Orthopedics;  Laterality: Right;   TRIGGER FINGER RELEASE Left  10/24/2012   Procedure: RELEASE TRIGGER FINGER/A-1 PULLEY LEFT MIDDLE AND LEFT RING;  Surgeon: Franky JONELLE Curia, MD;  Location: [facility];  Service: Orthopedics;  Laterality: Left;   UPPER GI ENDOSCOPY     Patient Active Problem List   Diagnosis Date Noted   Rotator cuff arthropathy, right 08/21/2023   S/P reverse total shoulder arthroplasty, right 08/21/2023   Sepsis due to undetermined organism (HCC) 08/20/2022   Cervical spondylosis with radiculopathy 02/26/2020   SBO (small bowel obstruction) (HCC) 08/21/2017   Trochanteric bursitis of left hip 01/18/2017   Lateral epicondylitis, right elbow 01/18/2017   Rheumatoid arthritis (HCC) 03/15/2016    DJD (degenerative joint disease), cervical 03/15/2016   DDD (degenerative disc disease), lumbar 03/15/2016   HTN (hypertension) 03/15/2016   Obesity 03/15/2016   Elevated cholesterol 03/15/2016   Neuralgia 03/15/2016   Malignant melanoma of right side of neck (HCC) 03/15/2016   BBB (bundle branch block) 03/15/2016   High risk medication use 03/15/2016   Osteoarthritis of lumbar spine 03/15/2016   Primary osteoarthritis of both hands 03/15/2016   Primary osteoarthritis of both knees 03/15/2016   Mesenteric adenitis 06/29/2015    PCP: Stamey, Chiquita POUR, FNP   REFERRING PROVIDER: Shirly Carlin CROME, PA-C   REFERRING DIAG: [phone] (ICD-10-CM) - S/P reverse total shoulder arthroplasty, Right (With subscapularis repair)  THERAPY DIAG:  Stiffness of right shoulder, not elsewhere classified  Muscle weakness (generalized)  Unsteadiness on feet  Pain in right arm  Localized edema  Rationale for Evaluation and Treatment: Rehabilitation  ONSET DATE: 08/21/23 DOS  SUBJECTIVE:                                                                                                                                                                                      SUBJECTIVE STATEMENT: Patient reports he did his exercises while on his cruise to [place] ; states he does not have any pain in shoulder. Patient states he wants to be able to have enough range and strength to return to fishing.   11/13/23: 13 weeks post op Hand dominance: Right  PERTINENT HISTORY: H/o falls,  ACDF, B TKA, HTN, h/o malignant melanoma in R neck affecting R trapezius muscle and R UE ROM  PAIN:  Are you having pain? No  PRECAUTIONS: Shoulder As of 10/03/23: Able to lift up to but not beyond 20#.  At Eval: Okay to very lightly lift with the operative extremity but no lifting anything heavier than a coffee cup or cell phone. Start physical therapy to focus on passive range of motion and active range of motion with  deltoid isometrics. Do not want to externally rotate past 30  degrees to protect subscapularis repair.   RED FLAGS: None   WEIGHT BEARING RESTRICTIONS: Yes R shoulder  FALLS:  Has patient fallen in last 6 months? Yes. Number of falls 4 tripping over things in cluttered garage  LIVING ENVIRONMENT: Lives with: lives with their spouse Lives in: House/apartment Stairs: Yes: Internal: 15 steps; on left going up and bilateral but cannot reach both and External: 3 steps; no railing on deck Has following equipment at home: Single point cane and Crutches  OCCUPATION: retired  PLOF: Independent  PATIENT GOALS:to be able to sleep in bed on side without pain, currently on couch or in bed on back  NEXT MD VISIT: Early June Billi)  OBJECTIVE:  Note: Objective measures were completed at Evaluation unless otherwise noted.  DIAGNOSTIC FINDINGS:  none  PATIENT SURVEYS:  Quick Dash  72.7 / 100 = 72.7 %  11/06/23  36.4 / 100 = 36.4 %  COGNITION: Overall cognitive status: Within functional limits for tasks assessed     SENSATION: Mild pull in post distal/lateral  wrist  POSTURE: Rounded shoulders  UPPER EXTREMITY ROM:   A/P ROM Right eval Left eval Right 5/8//25 Right 5/27 Right 10/26/23 Right  6/10  Shoulder flexion 130/142   150 active supine 70 active supine 150 active supine  Shoulder extension        Shoulder abduction 100 P   118 active 111 active supine   Shoulder adduction        Shoulder internal rotation    65    Shoulder external rotation 30/30   50    Elbow flexion        Elbow extension -23  -16 -11/0 -2 AAROM   Wrist flexion WFL       Wrist extension        Wrist ulnar deviation        Wrist radial deviation        Wrist pronation        Wrist supination        (Blank rows = not tested) R wrist limited since melanoma surgery  A/P ROM Right 6/12 Right 6/17  Shoulder flexion 150 supine 153 supine 79 deg sitting  Shoulder extension    Shoulder abduction   110  Shoulder adduction    Shoulder internal rotation  48  Shoulder external rotation  70  Elbow flexion    Elbow extension    Wrist flexion WFL   Wrist extension    Wrist ulnar deviation    Wrist radial deviation    Wrist pronation    Wrist supination        UPPER EXTREMITY MMT: NT due to surgery  PALPATION:  TTP in ant and post shoulder, wrist extensors    [facility] Adult PT Treatment:                                                DATE: 11/30/2023 Therapeutic Exercise: Supine:  Shoulder flexion + RTB  Shoulder abd + RTB Seated UT pin & stretch (Lt) Manual Therapy: STM RTC, posterior shoulder girdle, medial scapula borders Neuromuscular re-ed: Inclined supine --> shoulder flexion & abduction + YTB --> tactile cues for scap stability Seated & Side Lying --> shoulder flexion & abduction + tactile cues for scapulohumeral assist    [facility] Adult PT Treatment:  DATE: 11/15/2023 Therapeutic Exercise: Green shoulder ext 2x10 Blue Row 3x10 Standing shoulder flex and ABD with bent arm  2 x 10 ea  using mirror Standing wall slide flexion with 2#AW 2x 10 challenging Fitter black band x 20 forward flexion Standing flex red (punch) x 20 Standing IR red x 20 Standing ER walkout  x 20  Neuromuscular re-ed: S/L flexion 2# x 10, 3# x 20 S/L ER x 20 (slightly rolled backward) S/L ABD 1# wt x 10 ea palm down and thumb up S/L horizontal ABD 1# 2 x 10 Prone triceps ext 2# x 10  Prone T no wt x 10 Prone row 3# stopped  due to pain Supine shoulder circles 3# Supine shoulder flexion  x 20 Flex with 2# wt to 90 R only with scap retraction - use support under arm x 20 Supine ER with 2# wt on towel  with PT providing stop for end range x 20  Scaption x 20   [facility] Adult PT Treatment:                                                DATE: 11/13/2023  Therapeutic Exercise: On red swiss ball: Bent over shoulder ext to neutral 2# x 20;  triceps ext  with 2# wt x 20 Green shoulder ext 2x10 Blue Row 3x10 Standing shoulder flex and ABD x 10 ea  Standing wall slide flexion with 2#AW x 15 challenging Standing flex red (punch) x 20 Standing IR red x 20 Neuromuscular re-ed: S/L flexion 2# x 16, 3# x 20 S/L ER x 20 (slightly rolled backward) S/L ABD x 15, then with 1# wt x 15 S/L horizontal ABD x 15, 1# x 15 Supine shoulder flexion to 90 deg x 20 Flex with 2# wt to 90 R only with scap retraction - use support under arm x 20 Supine ER with 2# wt on towel  with PT providing stop for end range x 20  Scaption x 20                                                                         PATIENT EDUCATION: Education details: PT eval findings, anticipated POC, initial HEP, and reviewed precautions and limitations  Person educated: Patient Education method: Explanation, Demonstration, and Handouts Education comprehension: verbalized understanding and returned demonstration  HOME EXERCISE PROGRAM: Access Code: [email or web address] URL: [email or web address] Date: 11/15/2023 Prepared by: Mliss  Exercises - Seated Shoulder External Rotation AAROM with Cane and Hand in Neutral  - 1-2 x daily - 3 x weekly - 2 sets - 10 reps - Supine Shoulder Flexion Extension AAROM with Dowel  - 2 x daily - 7 x weekly - 1-3 sets - 10 reps - Wrist Flexion Extension AROM with Fingers Curled and Palm Down  - 2 x daily - 7 x weekly - 2 sets - 10 reps - Forearm Pronation and Supination With Arm Supported  - 2 x daily - 7 x weekly - 2 sets - 10 reps - Seated Scapular Retraction  - 1 x daily - 7 x weekly - 3  sets - 10 reps - Isometric Shoulder Abduction at Wall  - 1 x daily - 7 x weekly - 1 sets - 10 reps - 3 seconds hold - Isometric Shoulder Extension at Wall  - 1 x daily - 7 x weekly - 1 sets - 10 reps - 3 seconds hold - Isometric Shoulder Flexion at Wall  - 1 x daily - 7 x weekly - 1 sets - 10 reps - 3 seconds hold - Sidelying Shoulder Flexion 15 Degrees  (Mirrored)  - 1 x daily - 3-4 x weekly - 1-3 sets - 10 reps - Sidelying Shoulder Horizontal Abduction  - 1 x daily - 3-4 x weekly - 1-3 sets - 10 reps - Prone Elbow Extension with Dumbbell  - 1 x daily - 3 x weekly - 1-3 sets - 10 reps - Prone Shoulder Extension - Single Arm  - 1 x daily - 3 x weekly - 2 sets - 10 reps - Prone Single Arm Shoulder Horizontal Abduction with Scapular Retraction and Palm Down (Mirrored)  - 1 x daily - 3 x weekly - 1-3 sets - 10 reps  ASSESSMENT:  CLINICAL IMPRESSION:  Patient continues to demonstrate decreased shoulder flexion and abduction AROM with transition from supine to seated/standing. Decreased scapula mobility noted with arm raises in inclined position and sitting, as well as continued shoulder elevation in all positions with arm raises. Tactile cues to promote scapulohumeral rhythm mobility provided in seated and side lying positions with shoulder flexion and abduction; slight increase in those active ranges of motion demonstrated in sitting after intervention. Patient will continue to benefit from skilled therapy to address strength and mobility deficits.    OBJECTIVE IMPAIRMENTS: decreased activity tolerance, decreased balance, decreased ROM, decreased strength, increased edema, increased muscle spasms, impaired flexibility, impaired sensation, impaired UE functional use, postural dysfunction, obesity, and pain.    GOALS: Goals reviewed with patient? Yes  SHORT TERM GOALS: Target date: 10/11/2023   Patient will be independent with initial HEP.  Baseline:  Goal status: IN PROGRESS  2. Balance screen performed and appropriate goals set if indicated.  Baseline:  10/01/23: TUG - 8 sec; 5xSTS - 12 sec Goal status: MET    LONG TERM GOALS: Target date: 12/21/23  Patient will be independent with advanced/ongoing HEP to improve outcomes and carryover.  Baseline:  Goal status: IN PROGRESS 11/06/23  2.   Decreased Quick Dash by >= 10 points demonstrating  improved functional ability.  Baseline: 72.7 10/26/23: 25.0 Goal status: MET 10/26/23  3.  Patient to improve R shoulder AROM to [facility] without pain provocation to allow for increased ease of ADLs.  Baseline:  Goal status: IN PROGRESS 11/06/23  4.  Patient will demonstrate improved UE strength to 4+/5 to perform ADLS  Baseline:  Goal status: IN PROGRESS 11/06/23  5. Patient to report no pain in R forearm with ADLs.  Baseline:  Goal status: MET 11/06/23  6.  Patient will score >19/24 on the DGI to decrease fall risk.   Baseline: TBD Goal status: goal d/c   PLAN:  PT FREQUENCY: 2x/week  PT DURATION: 6 weeks  PLANNED INTERVENTIONS: 97164- PT Re-evaluation, 97110-Therapeutic exercises, 97530- Therapeutic activity, 97112- Neuromuscular re-education, 97535- Self Care, [id number]- Manual therapy, [id number]- Electrical stimulation (unattended), 97016- Vasopneumatic device, 97033- Ionotophoresis 4mg /ml Dexamethasone , Patient/Family education, Balance training, Taping, Dry Needling, Joint mobilization, Spinal mobilization, Cryotherapy, and Moist heat  PLAN FOR NEXT SESSION: Progress scapular strength per protocol; continue strengthening; scapula mobility.  Note: Prone shoulder strength  affected by Melanoma surgery so limited in ability. Pt able to lift up to 20# per MD note.   Lamarr Price, PTA 11/30/23, 12:17 PM

## 2023-12-04 ENCOUNTER — Encounter: Admitting: Physical Therapy

## 2023-12-04 DIAGNOSIS — Z800 Family history of malignant neoplasm of digestive organs: Secondary | ICD-10-CM | POA: Diagnosis not present

## 2023-12-04 DIAGNOSIS — K219 Gastro-esophageal reflux disease without esophagitis: Secondary | ICD-10-CM | POA: Diagnosis not present

## 2023-12-04 DIAGNOSIS — K2289 Other specified disease of esophagus: Secondary | ICD-10-CM | POA: Diagnosis not present

## 2023-12-04 DIAGNOSIS — K3189 Other diseases of stomach and duodenum: Secondary | ICD-10-CM | POA: Diagnosis not present

## 2023-12-04 DIAGNOSIS — Z9889 Other specified postprocedural states: Secondary | ICD-10-CM | POA: Diagnosis not present

## 2023-12-04 DIAGNOSIS — K2270 Barrett's esophagus without dysplasia: Secondary | ICD-10-CM | POA: Diagnosis not present

## 2023-12-05 ENCOUNTER — Other Ambulatory Visit: Payer: Self-pay | Admitting: Physician Assistant

## 2023-12-05 DIAGNOSIS — M0609 Rheumatoid arthritis without rheumatoid factor, multiple sites: Secondary | ICD-10-CM

## 2023-12-05 NOTE — Therapy (Unsigned)
 OUTPATIENT PHYSICAL THERAPY SHOULDER TREATMENT      Patient Name: David [name] MRN: [id number] DOB:[birth year], 73 y.o., male Today's Date: 12/05/2023  END OF SESSION:   Past Medical History:  Diagnosis Date   Arthritis    Balance problem    Barrett's esophagus    BBB (bundle branch block) 03/15/2016   Carpal tunnel syndrome    DDD (degenerative disc disease), lumbar 03/15/2016   Depression    Dysrhythmia    had some tachycardia with steroids   Elevated cholesterol 03/15/2016   Headache(784.0)    Ocular Migraines - takes Ambien  for it   Heart murmur    HTN (hypertension) 03/15/2016   Hyperlipemia    Hypertension    Malignant melanoma of right side of neck (HCC) 03/15/2016   73 years old   Neuralgia 03/15/2016   Ocular migraine    Osteoarthritis    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    RLS (restless legs syndrome)    Sleep apnea    uses CPAP   Snores    Status post gastric banding    Wears glasses    driving   Past Surgical History:  Procedure Laterality Date   ANTERIOR CERVICAL DECOMPRESSION/DISCECTOMY FUSION 4 LEVELS N/A 02/26/2020   Procedure: ANTERIOR CERVICAL DECOMPRESSION/DISCECTOMY FUSION, INTERBODY PROSTHESIS, PLATE/SCREWS CERVICAL THREE-FOUR CERVICAL FOUR-FIVE CERVICAL FIVE-SIX- CERVICAL SIX-SEVEN;  Surgeon: Mavis Purchase, MD;  Location: [facility] OR;  Service: Neurosurgery;  Laterality: N/A;   BASAL CELL CARCINOMA EXCISION Right    right temple area   CARPAL TUNNEL RELEASE Right 09/26/2012   Procedure: CARPAL TUNNEL RELEASE;  Surgeon: Franky JONELLE Curia, MD;  Location: [facility];  Service: Orthopedics;  Laterality: Right;   CARPAL TUNNEL RELEASE Left 10/24/2012   Procedure: CARPAL TUNNEL RELEASE;  Surgeon: Franky JONELLE Curia, MD;  Location: [facility];  Service: Orthopedics;  Laterality: Left;   CIRCUMCISION     as a baby   EXCISION MELANOMA WITH SENTINEL LYMPH  NODE BIOPSY  1984   rt neck with mulpipal nodes and some muscle excised rt neck-shoulder   KNEE ARTHROPLASTY     knee replacement     LAPAROSCOPIC GASTRIC BANDING  03/15/2009   LAPAROSCOPIC GASTRIC BANDING     MUSCLE BIOPSY  2004   rt thigh to r/o myocitis   nerve conduction velosity test     REVERSE SHOULDER ARTHROPLASTY Right 08/21/2023   Procedure: ARTHROPLASTY, SHOULDER, TOTAL, REVERSE;  Surgeon: Addie Cordella Hamilton, MD;  Location: [facility] OR;  Service: Orthopedics;  Laterality: Right;  RIGHT REVERSE SHOULDER ARTHROPLASTY   SHOULDER SURGERY Right 12/2021   TONSILLECTOMY     TOTAL KNEE ARTHROPLASTY Bilateral 12/05/2010   TRIGGER FINGER RELEASE Right 09/26/2012   Procedure: RELEASE TRIGGER FINGER/A-1 PULLEY RING AND SMALL ;  Surgeon: Franky JONELLE Curia, MD;  Location: [facility];  Service: Orthopedics;  Laterality: Right;   TRIGGER FINGER RELEASE Left 10/24/2012   Procedure: RELEASE TRIGGER FINGER/A-1 PULLEY LEFT MIDDLE AND LEFT RING;  Surgeon: Franky JONELLE Curia, MD;  Location: [facility];  Service: Orthopedics;  Laterality: Left;   UPPER GI ENDOSCOPY     Patient Active Problem List   Diagnosis Date Noted   Rotator cuff arthropathy, right 08/21/2023   S/P reverse total shoulder arthroplasty, right 08/21/2023   Sepsis due to undetermined organism (HCC) 08/20/2022   Cervical spondylosis with radiculopathy 02/26/2020   SBO (small bowel obstruction) (  HCC) 08/21/2017   Trochanteric bursitis of left hip 01/18/2017   Lateral epicondylitis, right elbow 01/18/2017   Rheumatoid arthritis (HCC) 03/15/2016   DJD (degenerative joint disease), cervical 03/15/2016   DDD (degenerative disc disease), lumbar 03/15/2016   HTN (hypertension) 03/15/2016   Obesity 03/15/2016   Elevated cholesterol 03/15/2016   Neuralgia 03/15/2016   Malignant melanoma of right side of neck (HCC) 03/15/2016   BBB (bundle branch block) 03/15/2016   High risk medication use 03/15/2016   Osteoarthritis  of lumbar spine 03/15/2016   Primary osteoarthritis of both hands 03/15/2016   Primary osteoarthritis of both knees 03/15/2016   Mesenteric adenitis 06/29/2015    PCP: Stamey, Chiquita POUR, FNP REFERRING PROVIDER: Crecencio Chiquita POUR, FNP  REFERRING DIAG: [phone] (ICD-10-CM) - S/P reverse total shoulder arthroplasty, Right (With subscapularis repair)  THERAPY DIAG:  No diagnosis found.  Rationale for Evaluation and Treatment: Rehabilitation  ONSET DATE: 08/21/23 DOS  SUBJECTIVE:                                                                                                                                                                                      SUBJECTIVE STATEMENT: ***Patient reports he did his exercises while on his cruise to [place] ; states he does not have any pain in shoulder. Patient states he wants to be able to have enough range and strength to return to fishing.   11/13/23: 13 weeks post op Hand dominance: Right  PERTINENT HISTORY: H/o falls,  ACDF, B TKA, HTN, h/o malignant melanoma in R neck affecting R trapezius muscle and R UE ROM  PAIN:  Are you having pain? No  PRECAUTIONS: Shoulder As of 10/03/23: Able to lift up to but not beyond 20#.  At Eval: Okay to very lightly lift with the operative extremity but no lifting anything heavier than a coffee cup or cell phone. Start physical therapy to focus on passive range of motion and active range of motion with deltoid isometrics. Do not want to externally rotate past 30 degrees to protect subscapularis repair.   WEIGHT BEARING RESTRICTIONS: Yes R shoulder  FALLS:  Has patient fallen in last 6 months? Yes. Number of falls 4 tripping over things in cluttered garage  LIVING ENVIRONMENT: Lives with: lives with their spouse Lives in: House/apartment Stairs: Yes: Internal: 15 steps; on left going up and bilateral but cannot reach both and External: 3 steps; no railing on deck Has following equipment at home: Single  point cane and Crutches  OCCUPATION: retired  PLOF: Independent  PATIENT GOALS:to be able to sleep in bed on side without pain, currently on couch or in bed on back  NEXT MD VISIT:  Early June Billi)  OBJECTIVE:  Note: Objective measures were completed at Evaluation unless otherwise noted.  DIAGNOSTIC FINDINGS:  none  PATIENT SURVEYS:  Quick Dash  72.7 / 100 = 72.7 %  11/06/23  36.4 / 100 = 36.4 %  COGNITION: Overall cognitive status: Within functional limits for tasks assessed     SENSATION: Mild pull in post distal/lateral  wrist  POSTURE: Rounded shoulders  UPPER EXTREMITY ROM:   A/P ROM Right eval Left eval Right 5/8//25 Right 5/27 Right 10/26/23 Right  6/10  Shoulder flexion 130/142   150 active supine 70 active supine 150 active supine  Shoulder extension        Shoulder abduction 100 P   118 active 111 active supine   Shoulder adduction        Shoulder internal rotation    65    Shoulder external rotation 30/30   50    Elbow flexion        Elbow extension -23  -16 -11/0 -2 AAROM   Wrist flexion WFL       Wrist extension        Wrist ulnar deviation        Wrist radial deviation        Wrist pronation        Wrist supination        (Blank rows = not tested) R wrist limited since melanoma surgery  A/P ROM Right 6/12 Right 6/17  Shoulder flexion 150 supine 153 supine 79 deg sitting  Shoulder extension    Shoulder abduction  110  Shoulder adduction    Shoulder internal rotation  48  Shoulder external rotation  70  Elbow flexion    Elbow extension    Wrist flexion WFL   Wrist extension    Wrist ulnar deviation    Wrist radial deviation    Wrist pronation    Wrist supination        UPPER EXTREMITY MMT: NT due to surgery  PALPATION:  TTP in ant and post shoulder, wrist extensors    [facility] Adult PT Treatment:                                                DATE: 12/05/23 Therapeutic Exercise: *** Manual Therapy: *** Neuromuscular  re-ed: *** Therapeutic Activity: *** Gait: *** Modalities: *** Self Care: ***  [facility] Adult PT Treatment:                                                DATE: 11/30/2023 Therapeutic Exercise: Supine:  Shoulder flexion + RTB  Shoulder abd + RTB Seated UT pin & stretch (Lt) Manual Therapy: STM RTC, posterior shoulder girdle, medial scapula borders Neuromuscular re-ed: Inclined supine --> shoulder flexion & abduction + YTB --> tactile cues for scap stability Seated & Side Lying --> shoulder flexion & abduction + tactile cues for scapulohumeral assist  [facility] Adult PT Treatment:                                                DATE: 11/15/2023 Therapeutic Exercise:  Green shoulder ext 2x10 Blue Row 3x10 Standing shoulder flex and ABD with bent arm  2 x 10 ea  using mirror Standing wall slide flexion with 2#AW 2x 10 challenging Fitter black band x 20 forward flexion Standing flex red (punch) x 20 Standing IR red x 20 Standing ER walkout  x 20  Neuromuscular re-ed: S/L flexion 2# x 10, 3# x 20 S/L ER x 20 (slightly rolled backward) S/L ABD 1# wt x 10 ea palm down and thumb up S/L horizontal ABD 1# 2 x 10 Prone triceps ext 2# x 10  Prone T no wt x 10 Prone row 3# stopped  due to pain Supine shoulder circles 3# Supine shoulder flexion  x 20 Flex with 2# wt to 90 R only with scap retraction - use support under arm x 20 Supine ER with 2# wt on towel  with PT providing stop for end range x 20  Scaption x 20   [facility] Adult PT Treatment:                                                DATE: 11/13/2023  Therapeutic Exercise: On red swiss ball: Bent over shoulder ext to neutral 2# x 20;  triceps ext with 2# wt x 20 Green shoulder ext 2x10 Blue Row 3x10 Standing shoulder flex and ABD x 10 ea  Standing wall slide flexion with 2#AW x 15 challenging Standing flex red (punch) x 20 Standing IR red x 20 Neuromuscular re-ed: S/L flexion 2# x 16, 3# x 20 S/L ER x 20 (slightly rolled  backward) S/L ABD x 15, then with 1# wt x 15 S/L horizontal ABD x 15, 1# x 15 Supine shoulder flexion to 90 deg x 20 Flex with 2# wt to 90 R only with scap retraction - use support under arm x 20 Supine ER with 2# wt on towel  with PT providing stop for end range x 20  Scaption x 20                                                                         PATIENT EDUCATION: Education details: PT eval findings, anticipated POC, initial HEP, and reviewed precautions and limitations  Person educated: Patient Education method: Explanation, Demonstration, and Handouts Education comprehension: verbalized understanding and returned demonstration  HOME EXERCISE PROGRAM: Access Code: [email or web address] URL: [email or web address] Date: 11/15/2023 Prepared by: Mliss  Exercises - Seated Shoulder External Rotation AAROM with Cane and Hand in Neutral  - 1-2 x daily - 3 x weekly - 2 sets - 10 reps - Supine Shoulder Flexion Extension AAROM with Dowel  - 2 x daily - 7 x weekly - 1-3 sets - 10 reps - Wrist Flexion Extension AROM with Fingers Curled and Palm Down  - 2 x daily - 7 x weekly - 2 sets - 10 reps - Forearm Pronation and Supination With Arm Supported  - 2 x daily - 7 x weekly - 2 sets - 10 reps - Seated Scapular Retraction  - 1 x daily - 7 x weekly - 3 sets - 10 reps -  Isometric Shoulder Abduction at Wall  - 1 x daily - 7 x weekly - 1 sets - 10 reps - 3 seconds hold - Isometric Shoulder Extension at Wall  - 1 x daily - 7 x weekly - 1 sets - 10 reps - 3 seconds hold - Isometric Shoulder Flexion at Wall  - 1 x daily - 7 x weekly - 1 sets - 10 reps - 3 seconds hold - Sidelying Shoulder Flexion 15 Degrees (Mirrored)  - 1 x daily - 3-4 x weekly - 1-3 sets - 10 reps - Sidelying Shoulder Horizontal Abduction  - 1 x daily - 3-4 x weekly - 1-3 sets - 10 reps - Prone Elbow Extension with Dumbbell  - 1 x daily - 3 x weekly - 1-3 sets - 10 reps - Prone Shoulder Extension - Single Arm  - 1 x daily - 3  x weekly - 2 sets - 10 reps - Prone Single Arm Shoulder Horizontal Abduction with Scapular Retraction and Palm Down (Mirrored)  - 1 x daily - 3 x weekly - 1-3 sets - 10 reps  ASSESSMENT:  CLINICAL IMPRESSION:  Patient continues to demonstrate decreased shoulder flexion and abduction AROM with transition from supine to seated/standing. Decreased scapula mobility noted with arm raises in inclined position and sitting, as well as continued shoulder elevation in all positions with arm raises. Tactile cues to promote scapulohumeral rhythm mobility provided in seated and side lying positions with shoulder flexion and abduction; slight increase in those active ranges of motion demonstrated in sitting after intervention. Patient will continue to benefit from skilled therapy to address strength and mobility deficits.    OBJECTIVE IMPAIRMENTS: decreased activity tolerance, decreased balance, decreased ROM, decreased strength, increased edema, increased muscle spasms, impaired flexibility, impaired sensation, impaired UE functional use, postural dysfunction, obesity, and pain.    GOALS: Goals reviewed with patient? Yes  SHORT TERM GOALS: Target date: 10/11/2023   Patient will be independent with initial HEP.  Baseline:  Goal status: IN PROGRESS  2. Balance screen performed and appropriate goals set if indicated.  Baseline:  10/01/23: TUG - 8 sec; 5xSTS - 12 sec Goal status: MET  LONG TERM GOALS: Target date: 12/21/23  Patient will be independent with advanced/ongoing HEP to improve outcomes and carryover.  Baseline:  Goal status: IN PROGRESS 11/06/23  2.   Decreased Quick Dash by >= 10 points demonstrating improved functional ability.  Baseline: 72.7 10/26/23: 25.0 Goal status: MET 10/26/23  3.  Patient to improve R shoulder AROM to [facility] [place] without pain provocation to allow for increased ease of ADLs.  Baseline:  Goal status: IN PROGRESS 11/06/23  4.  Patient will demonstrate improved UE strength to  4+/5 to perform ADLS  Baseline:  Goal status: IN PROGRESS 11/06/23  5. Patient to report no pain in R forearm with ADLs.  Baseline:  Goal status: MET 11/06/23  6.  Patient will score >19/24 on the DGI to decrease fall risk.   Baseline: TBD Goal status: goal d/c   PLAN:  PT FREQUENCY: 2x/week  PT DURATION: 6 weeks  PLANNED INTERVENTIONS: 97164- PT Re-evaluation, 97110-Therapeutic exercises, 97530- Therapeutic activity, 97112- Neuromuscular re-education, 97535- Self Care, [id number]- Manual therapy, [id number]- Electrical stimulation (unattended), 97016- Vasopneumatic device, 97033- Ionotophoresis 4mg /ml Dexamethasone , Patient/Family education, Balance training, Taping, Dry Needling, Joint mobilization, Spinal mobilization, Cryotherapy, and Moist heat  PLAN FOR NEXT SESSION: Progress scapular strength per protocol; continue strengthening; scapula mobility.  Note: Prone shoulder strength affected by Melanoma surgery so limited in  ability. Pt able to lift up to 20# per MD note.   Reese Senk, PT 12/05/23, 7:38 PM

## 2023-12-06 ENCOUNTER — Ambulatory Visit

## 2023-12-06 DIAGNOSIS — R2681 Unsteadiness on feet: Secondary | ICD-10-CM

## 2023-12-06 DIAGNOSIS — M79601 Pain in right arm: Secondary | ICD-10-CM

## 2023-12-06 DIAGNOSIS — M6281 Muscle weakness (generalized): Secondary | ICD-10-CM

## 2023-12-06 DIAGNOSIS — M25611 Stiffness of right shoulder, not elsewhere classified: Secondary | ICD-10-CM | POA: Diagnosis not present

## 2023-12-06 DIAGNOSIS — R600 Localized edema: Secondary | ICD-10-CM | POA: Diagnosis not present

## 2023-12-06 NOTE — Therapy (Signed)
 OUTPATIENT PHYSICAL THERAPY SHOULDER TREATMENT      Patient Name: David [name] MRN: [id number] DOB:June 17, 1950, 73 y.o., male Today's Date: 12/06/2023  END OF SESSION:  PT End of Session - 12/06/23 1110     Visit Number 17    Date for PT Re-Evaluation 12/21/23    Authorization Type [employer] MCR    Authorization - Visit Number 17    Progress Note Due on Visit 21    PT Start Time 1110    PT Stop Time 1155    PT Time Calculation (min) 45 min    Activity Tolerance Patient tolerated treatment well    Behavior During Therapy WFL for tasks assessed/performed         Past Medical History:  Diagnosis Date   Arthritis    Balance problem    Barrett's esophagus    BBB (bundle branch block) 03/15/2016   Carpal tunnel syndrome    DDD (degenerative disc disease), lumbar 03/15/2016   Depression    Dysrhythmia    had some tachycardia with steroids   Elevated cholesterol 03/15/2016   Headache(784.0)    Ocular Migraines - takes Ambien  for it   Heart murmur    HTN (hypertension) 03/15/2016   Hyperlipemia    Hypertension    Malignant melanoma of right side of neck (HCC) 03/15/2016   73 years old   Neuralgia 03/15/2016   Ocular migraine    Osteoarthritis    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    RLS (restless legs syndrome)    Sleep apnea    uses CPAP   Snores    Status post gastric banding    Wears glasses    driving   Past Surgical History:  Procedure Laterality Date   ANTERIOR CERVICAL DECOMPRESSION/DISCECTOMY FUSION 4 LEVELS N/A 02/26/2020   Procedure: ANTERIOR CERVICAL DECOMPRESSION/DISCECTOMY FUSION, INTERBODY PROSTHESIS, PLATE/SCREWS CERVICAL THREE-FOUR CERVICAL FOUR-FIVE CERVICAL FIVE-SIX- CERVICAL SIX-SEVEN;  Surgeon: Mavis Purchase, MD;  Location: [facility] OR;  Service: Neurosurgery;  Laterality: N/A;   BASAL CELL CARCINOMA EXCISION Right    right temple area   CARPAL TUNNEL RELEASE Right 09/26/2012    Procedure: CARPAL TUNNEL RELEASE;  Surgeon: Franky JONELLE Curia, MD;  Location: [facility];  Service: Orthopedics;  Laterality: Right;   CARPAL TUNNEL RELEASE Left 10/24/2012   Procedure: CARPAL TUNNEL RELEASE;  Surgeon: Franky JONELLE Curia, MD;  Location: [facility];  Service: Orthopedics;  Laterality: Left;   CIRCUMCISION     as a baby   EXCISION MELANOMA WITH SENTINEL LYMPH NODE BIOPSY  1984   rt neck with mulpipal nodes and some muscle excised rt neck-shoulder   KNEE ARTHROPLASTY     knee replacement     LAPAROSCOPIC GASTRIC BANDING  03/15/2009   LAPAROSCOPIC GASTRIC BANDING     MUSCLE BIOPSY  2004   rt thigh to r/o myocitis   nerve conduction velosity test     REVERSE SHOULDER ARTHROPLASTY Right 08/21/2023   Procedure: ARTHROPLASTY, SHOULDER, TOTAL, REVERSE;  Surgeon: Addie Cordella Hamilton, MD;  Location: [facility] OR;  Service: Orthopedics;  Laterality: Right;  RIGHT REVERSE SHOULDER ARTHROPLASTY   SHOULDER SURGERY Right 12/2021   TONSILLECTOMY     TOTAL KNEE ARTHROPLASTY Bilateral 12/05/2010   TRIGGER FINGER RELEASE Right 09/26/2012   Procedure: RELEASE TRIGGER FINGER/A-1 PULLEY RING AND SMALL ;  Surgeon: Franky JONELLE Curia, MD;  Location: [facility];  Service: Orthopedics;  Laterality: Right;   TRIGGER FINGER RELEASE Left 10/24/2012   Procedure: RELEASE TRIGGER FINGER/A-1 PULLEY LEFT MIDDLE AND LEFT RING;  Surgeon: Franky JONELLE Curia, MD;  Location: [facility];  Service: Orthopedics;  Laterality: Left;   UPPER GI ENDOSCOPY     Patient Active Problem List   Diagnosis Date Noted   Rotator cuff arthropathy, right 08/21/2023   S/P reverse total shoulder arthroplasty, right 08/21/2023   Sepsis due to undetermined organism (HCC) 08/20/2022   Cervical spondylosis with radiculopathy 02/26/2020   SBO (small bowel obstruction) (HCC) 08/21/2017   Trochanteric bursitis of left hip 01/18/2017   Lateral epicondylitis, right elbow 01/18/2017   Rheumatoid  arthritis (HCC) 03/15/2016   DJD (degenerative joint disease), cervical 03/15/2016   DDD (degenerative disc disease), lumbar 03/15/2016   HTN (hypertension) 03/15/2016   Obesity 03/15/2016   Elevated cholesterol 03/15/2016   Neuralgia 03/15/2016   Malignant melanoma of right side of neck (HCC) 03/15/2016   BBB (bundle branch block) 03/15/2016   High risk medication use 03/15/2016   Osteoarthritis of lumbar spine 03/15/2016   Primary osteoarthritis of both hands 03/15/2016   Primary osteoarthritis of both knees 03/15/2016   Mesenteric adenitis 06/29/2015    PCP: Stamey, Chiquita POUR, FNP   REFERRING PROVIDER: Crecencio Chiquita POUR, FNP   REFERRING DIAG: [phone] (ICD-10-CM) - S/P reverse total shoulder arthroplasty, Right (With subscapularis repair)  THERAPY DIAG:  Stiffness of right shoulder, not elsewhere classified  Muscle weakness (generalized)  Unsteadiness on feet  Pain in right arm  Localized edema  Rationale for Evaluation and Treatment: Rehabilitation  ONSET DATE: 08/21/23 DOS  SUBJECTIVE:                                                                                                                                                                                      SUBJECTIVE STATEMENT: Patient reports his shoulder is sore around incision after endoscopy; states he noticed he was able to raise a little higher after last PT session. Patient states he has popping in Rt shoulder when pushing off of arm to get out of bed.  11/13/23: 13 weeks post op Hand dominance: Right  PERTINENT HISTORY: H/o falls,  ACDF, B TKA, HTN, h/o malignant melanoma in R neck affecting R trapezius muscle and R UE ROM  PAIN:  Are you having pain? No  PRECAUTIONS: Shoulder As of 10/03/23: Able to lift up to but not beyond 20#.  At Eval: Okay to very lightly lift with the operative extremity but no lifting anything heavier than a coffee cup or cell phone. Start physical therapy to focus on  passive range of motion and active range of  motion with deltoid isometrics. Do not want to externally rotate past 30 degrees to protect subscapularis repair.   RED FLAGS: None   WEIGHT BEARING RESTRICTIONS: Yes R shoulder  FALLS:  Has patient fallen in last 6 months? Yes. Number of falls 4 tripping over things in cluttered garage  LIVING ENVIRONMENT: Lives with: lives with their spouse Lives in: House/apartment Stairs: Yes: Internal: 15 steps; on left going up and bilateral but cannot reach both and External: 3 steps; no railing on deck Has following equipment at home: Single point cane and Crutches  OCCUPATION: retired  PLOF: Independent  PATIENT GOALS:to be able to sleep in bed on side without pain, currently on couch or in bed on back  NEXT MD VISIT: Early June Billi)  OBJECTIVE:  Note: Objective measures were completed at Evaluation unless otherwise noted.  DIAGNOSTIC FINDINGS:  none  PATIENT SURVEYS:  Quick Dash  72.7 / 100 = 72.7 %  11/06/23  36.4 / 100 = 36.4 %  COGNITION: Overall cognitive status: Within functional limits for tasks assessed     SENSATION: Mild pull in post distal/lateral  wrist  POSTURE: Rounded shoulders  UPPER EXTREMITY ROM:   A/P ROM Right eval Left eval Right 5/8//25 Right 5/27 Right 10/26/23 Right  6/10  Shoulder flexion 130/142   150 active supine 70 active supine 150 active supine  Shoulder extension        Shoulder abduction 100 P   118 active 111 active supine   Shoulder adduction        Shoulder internal rotation    65    Shoulder external rotation 30/30   50    Elbow flexion        Elbow extension -23  -16 -11/0 -2 AAROM   Wrist flexion WFL       Wrist extension        Wrist ulnar deviation        Wrist radial deviation        Wrist pronation        Wrist supination        (Blank rows = not tested) R wrist limited since melanoma surgery  A/P ROM Right 6/12 Right 6/17  Shoulder flexion 150 supine 153 supine 79  deg sitting  Shoulder extension    Shoulder abduction  110  Shoulder adduction    Shoulder internal rotation  48  Shoulder external rotation  70  Elbow flexion    Elbow extension    Wrist flexion WFL   Wrist extension    Wrist ulnar deviation    Wrist radial deviation    Wrist pronation    Wrist supination        UPPER EXTREMITY MMT: NT due to surgery  PALPATION:  TTP in ant and post shoulder, wrist extensors    [facility] Adult PT Treatment:                                                DATE: 12/06/2023 Neuromuscular re-ed: S/L shoulder flexion & abduction + tactile cues for scapulohumeral assist Wall clock at wall (Rt)  Shoulder flexion + black super band providing shoulder stablization Bent over resisted scap retraction holding 5#DB --> tactile cue on scapula Therapeutic Activity: Standing table slides in flexion + tactile cues for shoulder stability Standing wall slides in flexion & scaption x10 each -->  attempted abduction but discontinued d/t pain   [facility] Adult PT Treatment:                                                DATE: 11/30/2023 Therapeutic Exercise: Supine:  Shoulder flexion + RTB  Shoulder abd + RTB Seated UT pin & stretch (Lt) Manual Therapy: STM RTC, posterior shoulder girdle, medial scapula borders Neuromuscular re-ed: Inclined supine --> shoulder flexion & abduction + YTB --> tactile cues for scap stability Seated & Side Lying --> shoulder flexion & abduction + tactile cues for scapulohumeral assist    [facility] Adult PT Treatment:                                                DATE: 11/15/2023 Therapeutic Exercise: Green shoulder ext 2x10 Blue Row 3x10 Standing shoulder flex and ABD with bent arm  2 x 10 ea  using mirror Standing wall slide flexion with 2#AW 2x 10 challenging Fitter black band x 20 forward flexion Standing flex red (punch) x 20 Standing IR red x 20 Standing ER walkout  x 20  Neuromuscular re-ed: S/L flexion 2# x 10, 3# x 20 S/L ER  x 20 (slightly rolled backward) S/L ABD 1# wt x 10 ea palm down and thumb up S/L horizontal ABD 1# 2 x 10 Prone triceps ext 2# x 10  Prone T no wt x 10 Prone row 3# stopped  due to pain Supine shoulder circles 3# Supine shoulder flexion  x 20 Flex with 2# wt to 90 R only with scap retraction - use support under arm x 20 Supine ER with 2# wt on towel  with PT providing stop for end range x 20  Scaption x 20                                                                        PATIENT EDUCATION: Education details: Updated HEP  Person educated: Patient Education method: Explanation, Demonstration, and Handouts Education comprehension: verbalized understanding and returned demonstration  HOME EXERCISE PROGRAM: Access Code: [email or web address] URL: [email or web address] Date: 12/06/2023 Prepared by: Lamarr Price  Exercises - Seated Shoulder External Rotation AAROM with Cane and Hand in Neutral  - 1-2 x daily - 3 x weekly - 2 sets - 10 reps - Supine Shoulder Flexion Extension AAROM with Dowel  - 2 x daily - 7 x weekly - 1-3 sets - 10 reps - Wrist Flexion Extension AROM with Fingers Curled and Palm Down  - 2 x daily - 7 x weekly - 2 sets - 10 reps - Forearm Pronation and Supination With Arm Supported  - 2 x daily - 7 x weekly - 2 sets - 10 reps - Seated Scapular Retraction  - 1 x daily - 7 x weekly - 3 sets - 10 reps - Isometric Shoulder Abduction at Wall  - 1 x daily - 7 x weekly - 1 sets - 10 reps - 3 seconds hold - Isometric  Shoulder Extension at Wall  - 1 x daily - 7 x weekly - 1 sets - 10 reps - 3 seconds hold - Isometric Shoulder Flexion at Wall  - 1 x daily - 7 x weekly - 1 sets - 10 reps - 3 seconds hold - Sidelying Shoulder Flexion 15 Degrees (Mirrored)  - 1 x daily - 3-4 x weekly - 1-3 sets - 10 reps - Sidelying Shoulder Horizontal Abduction  - 1 x daily - 3-4 x weekly - 1-3 sets - 10 reps - Prone Elbow Extension with Dumbbell  - 1 x daily - 3 x weekly - 1-3 sets - 10  reps - Prone Shoulder Extension - Single Arm  - 1 x daily - 3 x weekly - 2 sets - 10 reps - Prone Single Arm Shoulder Horizontal Abduction with Scapular Retraction and Palm Down (Mirrored)  - 1 x daily - 3 x weekly - 1-3 sets - 10 reps - Wall Clock  - 1 x daily - 7 x weekly - 3 sets - 10 reps   ASSESSMENT:  CLINICAL IMPRESSION:  Continued focus on scapula mobility and shoulder stabilization activities. Frequent tactile and verbal cueing provided throughout session to maintain proper body mechanics. Improved shoulder stabilization demonstrated during wall slides in flexion and scaption; abduction plane attempted but discontinued due to pain. Patient continues to compensate with elevated shoulder/scapula when raising arm above 90 degrees.   OBJECTIVE IMPAIRMENTS: decreased activity tolerance, decreased balance, decreased ROM, decreased strength, increased edema, increased muscle spasms, impaired flexibility, impaired sensation, impaired UE functional use, postural dysfunction, obesity, and pain.    GOALS: Goals reviewed with patient? Yes  SHORT TERM GOALS: Target date: 10/11/2023   Patient will be independent with initial HEP.  Baseline:  Goal status: IN PROGRESS  2. Balance screen performed and appropriate goals set if indicated.  Baseline:  10/01/23: TUG - 8 sec; 5xSTS - 12 sec Goal status: MET    LONG TERM GOALS: Target date: 12/21/23  Patient will be independent with advanced/ongoing HEP to improve outcomes and carryover.  Baseline:  Goal status: IN PROGRESS 11/06/23  2.   Decreased Quick Dash by >= 10 points demonstrating improved functional ability.  Baseline: 72.7 10/26/23: 25.0 Goal status: MET 10/26/23  3.  Patient to improve R shoulder AROM to [facility] without pain provocation to allow for increased ease of ADLs.  Baseline:  Goal status: IN PROGRESS 11/06/23  4.  Patient will demonstrate improved UE strength to 4+/5 to perform ADLS  Baseline:  Goal status: IN PROGRESS  11/06/23  5. Patient to report no pain in R forearm with ADLs.  Baseline:  Goal status: MET 11/06/23  6.  Patient will score >19/24 on the DGI to decrease fall risk.   Baseline: TBD Goal status: goal d/c   PLAN:  PT FREQUENCY: 2x/week  PT DURATION: 6 weeks  PLANNED INTERVENTIONS: 97164- PT Re-evaluation, 97110-Therapeutic exercises, 97530- Therapeutic activity, [id number]- Neuromuscular re-education, 97535- Self Care, [id number]- Manual therapy, [id number]- Electrical stimulation (unattended), 97016- Vasopneumatic device, 97033- Ionotophoresis 4mg /ml Dexamethasone , Patient/Family education, Balance training, Taping, Dry Needling, Joint mobilization, Spinal mobilization, Cryotherapy, and Moist heat  PLAN FOR NEXT SESSION: Progress scapula mobility & stabilization. Progress scapular strength per protocol; continue strengthening; Note: Prone shoulder strength affected by Melanoma surgery so limited in ability. Pt able to lift up to 20# per MD note.   Lamarr Price, PTA 12/06/23, 12:02 PM

## 2023-12-06 NOTE — Telephone Encounter (Signed)
 Last Fill: 08/08/2023  Labs: 09/05/2023 Glucose is 151. Rest of CMP WNL.   RBC count and hemoglobin are borderline low. Rest of CBC WNL.     Next Visit: 04/09/2024  Last Visit: 11/08/2023  DX:  Rheumatoid arthritis of multiple sites with negative rheumatoid factor    Current Dose per office note 11/08/2023: sulfasalazine  500 mg 2 tablets po BID.   Okay to refill Sulfasalazine ?

## 2023-12-10 NOTE — Therapy (Signed)
 OUTPATIENT PHYSICAL THERAPY SHOULDER TREATMENT      Patient Name: David [name] MRN: [id number] DOB:08-15-1950, 73 y.o., male Today's Date: 12/11/2023  END OF SESSION:  PT End of Session - 12/11/23 1323     Visit Number 18    Date for PT Re-Evaluation 12/21/23    Authorization Type [employer] MCR    Authorization - Visit Number 18    Progress Note Due on Visit 21    PT Start Time 1317    PT Stop Time 1402    PT Time Calculation (min) 45 min    Activity Tolerance Patient tolerated treatment well    Behavior During Therapy WFL for tasks assessed/performed          Past Medical History:  Diagnosis Date   Arthritis    Balance problem    Barrett's esophagus    BBB (bundle branch block) 03/15/2016   Carpal tunnel syndrome    DDD (degenerative disc disease), lumbar 03/15/2016   Depression    Dysrhythmia    had some tachycardia with steroids   Elevated cholesterol 03/15/2016   Headache(784.0)    Ocular Migraines - takes Ambien  for it   Heart murmur    HTN (hypertension) 03/15/2016   Hyperlipemia    Hypertension    Malignant melanoma of right side of neck (HCC) 03/15/2016   73 years old   Neuralgia 03/15/2016   Ocular migraine    Osteoarthritis    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    RLS (restless legs syndrome)    Sleep apnea    uses CPAP   Snores    Status post gastric banding    Wears glasses    driving   Past Surgical History:  Procedure Laterality Date   ANTERIOR CERVICAL DECOMPRESSION/DISCECTOMY FUSION 4 LEVELS N/A 02/26/2020   Procedure: ANTERIOR CERVICAL DECOMPRESSION/DISCECTOMY FUSION, INTERBODY PROSTHESIS, PLATE/SCREWS CERVICAL THREE-FOUR CERVICAL FOUR-FIVE CERVICAL FIVE-SIX- CERVICAL SIX-SEVEN;  Surgeon: Mavis Purchase, MD;  Location: [facility] OR;  Service: Neurosurgery;  Laterality: N/A;   BASAL CELL CARCINOMA EXCISION Right    right temple area   CARPAL TUNNEL RELEASE Right  09/26/2012   Procedure: CARPAL TUNNEL RELEASE;  Surgeon: Franky JONELLE Curia, MD;  Location: [facility];  Service: Orthopedics;  Laterality: Right;   CARPAL TUNNEL RELEASE Left 10/24/2012   Procedure: CARPAL TUNNEL RELEASE;  Surgeon: Franky JONELLE Curia, MD;  Location: [facility];  Service: Orthopedics;  Laterality: Left;   CIRCUMCISION     as a baby   EXCISION MELANOMA WITH SENTINEL LYMPH NODE BIOPSY  1984   rt neck with mulpipal nodes and some muscle excised rt neck-shoulder   KNEE ARTHROPLASTY     knee replacement     LAPAROSCOPIC GASTRIC BANDING  03/15/2009   LAPAROSCOPIC GASTRIC BANDING     MUSCLE BIOPSY  2004   rt thigh to r/o myocitis   nerve conduction velosity test     REVERSE SHOULDER ARTHROPLASTY Right 08/21/2023   Procedure: ARTHROPLASTY, SHOULDER, TOTAL, REVERSE;  Surgeon: Addie Cordella Hamilton, MD;  Location: [facility] OR;  Service: Orthopedics;  Laterality: Right;  RIGHT REVERSE SHOULDER ARTHROPLASTY   SHOULDER SURGERY Right 12/2021   TONSILLECTOMY     TOTAL KNEE ARTHROPLASTY Bilateral 12/05/2010   TRIGGER FINGER RELEASE Right 09/26/2012   Procedure: RELEASE TRIGGER FINGER/A-1 PULLEY RING AND SMALL ;  Surgeon: Franky JONELLE Curia, MD;  Location: [facility];  Service: Orthopedics;  Laterality: Right;   TRIGGER FINGER RELEASE Left 10/24/2012   Procedure: RELEASE TRIGGER FINGER/A-1 PULLEY LEFT MIDDLE AND LEFT RING;  Surgeon: Franky JONELLE Curia, MD;  Location: [facility];  Service: Orthopedics;  Laterality: Left;   UPPER GI ENDOSCOPY     Patient Active Problem List   Diagnosis Date Noted   Rotator cuff arthropathy, right 08/21/2023   S/P reverse total shoulder arthroplasty, right 08/21/2023   Sepsis due to undetermined organism (HCC) 08/20/2022   Cervical spondylosis with radiculopathy 02/26/2020   SBO (small bowel obstruction) (HCC) 08/21/2017   Trochanteric bursitis of left hip 01/18/2017   Lateral epicondylitis, right elbow 01/18/2017    Rheumatoid arthritis (HCC) 03/15/2016   DJD (degenerative joint disease), cervical 03/15/2016   DDD (degenerative disc disease), lumbar 03/15/2016   HTN (hypertension) 03/15/2016   Obesity 03/15/2016   Elevated cholesterol 03/15/2016   Neuralgia 03/15/2016   Malignant melanoma of right side of neck (HCC) 03/15/2016   BBB (bundle branch block) 03/15/2016   High risk medication use 03/15/2016   Osteoarthritis of lumbar spine 03/15/2016   Primary osteoarthritis of both hands 03/15/2016   Primary osteoarthritis of both knees 03/15/2016   Mesenteric adenitis 06/29/2015    PCP: Stamey, Chiquita POUR, FNP   REFERRING PROVIDER: Shirly Carlin CROME, PA-C   REFERRING DIAG: [phone] (ICD-10-CM) - S/P reverse total shoulder arthroplasty, Right (With subscapularis repair)  THERAPY DIAG:  Stiffness of right shoulder, not elsewhere classified  Muscle weakness (generalized)  Unsteadiness on feet  Pain in right arm  Rationale for Evaluation and Treatment: Rehabilitation  ONSET DATE: 08/21/23 DOS  SUBJECTIVE:                                                                                                                                                                                      SUBJECTIVE STATEMENT: Sore from doing my new exercises  12/11/23: 17 weeks post op Hand dominance: Right  PERTINENT HISTORY: H/o falls,  ACDF, B TKA, HTN, h/o malignant melanoma in R neck affecting R trapezius muscle and R UE ROM  PAIN:  Are you having pain? No  PRECAUTIONS: Shoulder As of 10/03/23: Able to lift up to but not beyond 20#.  At Eval: Okay to very lightly lift with the operative extremity but no lifting anything heavier than a coffee cup or cell phone. Start physical therapy to focus on passive range of motion and active range of motion with deltoid isometrics. Do not want to externally rotate past 30 degrees to protect subscapularis repair.   RED FLAGS: None   WEIGHT BEARING RESTRICTIONS:  Yes R shoulder  FALLS:  Has patient fallen in last 6 months?  Yes. Number of falls 4 tripping over things in cluttered garage  LIVING ENVIRONMENT: Lives with: lives with their spouse Lives in: House/apartment Stairs: Yes: Internal: 15 steps; on left going up and bilateral but cannot reach both and External: 3 steps; no railing on deck Has following equipment at home: Single point cane and Crutches  OCCUPATION: retired  PLOF: Independent  PATIENT GOALS:to be able to sleep in bed on side without pain, currently on couch or in bed on back  NEXT MD VISIT: Early June Billi)  OBJECTIVE:  Note: Objective measures were completed at Evaluation unless otherwise noted.  DIAGNOSTIC FINDINGS:  none  PATIENT SURVEYS:  Quick Dash  72.7 / 100 = 72.7 %  11/06/23  36.4 / 100 = 36.4 %  COGNITION: Overall cognitive status: Within functional limits for tasks assessed     SENSATION: Mild pull in post distal/lateral  wrist  POSTURE: Rounded shoulders  UPPER EXTREMITY ROM:   A/P ROM Right eval Left eval Right 5/8//25 Right 5/27 Right 10/26/23 Right  6/10  Shoulder flexion 130/142   150 active supine 70 active supine 150 active supine  Shoulder extension        Shoulder abduction 100 P   118 active 111 active supine   Shoulder adduction        Shoulder internal rotation    65    Shoulder external rotation 30/30   50    Elbow flexion        Elbow extension -23  -16 -11/0 -2 AAROM   Wrist flexion WFL       Wrist extension        Wrist ulnar deviation        Wrist radial deviation        Wrist pronation        Wrist supination        (Blank rows = not tested) R wrist limited since melanoma surgery  A/P ROM Right 6/12 Right 6/17  Shoulder flexion 150 supine 153 supine 79 deg sitting  Shoulder extension    Shoulder abduction  110  Shoulder adduction    Shoulder internal rotation  48  Shoulder external rotation  70  Elbow flexion    Elbow extension    Wrist flexion WFL    Wrist extension    Wrist ulnar deviation    Wrist radial deviation    Wrist pronation    Wrist supination        UPPER EXTREMITY MMT: NT due to surgery  PALPATION:  TTP in ant and post shoulder, wrist extensors     [facility] Adult PT Treatment:                                                DATE: 12/11/2023 Neuromuscular re-ed: S/L shoulder flexion & abduction Wall clock at wall (Rt)  Shoulder flexion  no assist required today Bent over scapular retraction to left back pocket Bent over resisted scap retraction holding 5#DB --> tactile cue on scapula Bent over horizontal ABD (less than 90 deg ) - difficult without TC to depress scapula Serratus push bent over on table B and unilaterally Standing elbow extension with towel behind elbow and blocking anterior shoulder Therapeutic Activity: Standing table slides in flexion and ABD + tactile cues for shoulder stability Standing wall slides in ABD x10 each - pain on return  [facility] Adult PT Treatment:                                                DATE: 12/06/2023 Neuromuscular re-ed: S/L shoulder flexion & abduction + tactile cues for scapulohumeral assist Wall clock at wall (Rt)  Shoulder flexion + black super band providing shoulder stablization Bent over resisted scap retraction holding 5#DB --> tactile cue on scapula Therapeutic Activity: Standing table slides in flexion + tactile cues for shoulder stability Standing wall slides in flexion & scaption x10 each --> attempted abduction but discontinued d/t pain   [facility] Adult PT Treatment:                                                DATE: 11/30/2023 Therapeutic Exercise: Supine:  Shoulder flexion + RTB  Shoulder abd + RTB Seated UT pin & stretch (Lt) Manual Therapy: STM RTC, posterior shoulder girdle, medial scapula borders Neuromuscular re-ed: Inclined supine --> shoulder flexion & abduction + YTB --> tactile cues for scap stability Seated & Side Lying --> shoulder flexion &  abduction + tactile cues for scapulohumeral assist    [facility] Adult PT Treatment:                                                DATE: 11/15/2023 Therapeutic Exercise: Green shoulder ext 2x10 Blue Row 3x10 Standing shoulder flex and ABD with bent arm  2 x 10 ea  using mirror Standing wall slide flexion with 2#AW 2x 10 challenging Fitter black band x 20 forward flexion Standing flex red (punch) x 20 Standing IR red x 20 Standing ER walkout  x 20  Neuromuscular re-ed: S/L flexion 2# x 10, 3# x 20 S/L ER x 20 (slightly rolled backward) S/L ABD 1# wt x 10 ea palm down and thumb up S/L horizontal ABD 1# 2 x 10 Prone triceps ext 2# x 10  Prone T no wt x 10 Prone row 3# stopped  due to pain Supine shoulder circles 3# Supine shoulder flexion  x 20 Flex with 2# wt to 90 R only with scap retraction - use support under arm x 20 Supine ER with 2# wt on towel  with PT providing stop for end range x 20  Scaption x 20                                                                        PATIENT EDUCATION: Education details: Updated HEP  Person educated: Patient Education method: Explanation, Demonstration, and Handouts Education comprehension: verbalized understanding and returned demonstration  HOME EXERCISE PROGRAM: Access Code: [email or web address] URL: [email or web address] Date: 12/06/2023 Prepared by: Lamarr Price  Exercises - Seated Shoulder External Rotation AAROM with Cane and Hand in Neutral  - 1-2 x daily - 3 x weekly - 2 sets - 10 reps - Supine  Shoulder Flexion Extension AAROM with Dowel  - 2 x daily - 7 x weekly - 1-3 sets - 10 reps - Wrist Flexion Extension AROM with Fingers Curled and Palm Down  - 2 x daily - 7 x weekly - 2 sets - 10 reps - Forearm Pronation and Supination With Arm Supported  - 2 x daily - 7 x weekly - 2 sets - 10 reps - Seated Scapular Retraction  - 1 x daily - 7 x weekly - 3 sets - 10 reps - Isometric Shoulder Abduction at Wall  - 1 x daily - 7 x  weekly - 1 sets - 10 reps - 3 seconds hold - Isometric Shoulder Extension at Wall  - 1 x daily - 7 x weekly - 1 sets - 10 reps - 3 seconds hold - Isometric Shoulder Flexion at Wall  - 1 x daily - 7 x weekly - 1 sets - 10 reps - 3 seconds hold - Sidelying Shoulder Flexion 15 Degrees (Mirrored)  - 1 x daily - 3-4 x weekly - 1-3 sets - 10 reps - Sidelying Shoulder Horizontal Abduction  - 1 x daily - 3-4 x weekly - 1-3 sets - 10 reps - Prone Elbow Extension with Dumbbell  - 1 x daily - 3 x weekly - 1-3 sets - 10 reps - Prone Shoulder Extension - Single Arm  - 1 x daily - 3 x weekly - 2 sets - 10 reps - Prone Single Arm Shoulder Horizontal Abduction with Scapular Retraction and Palm Down (Mirrored)  - 1 x daily - 3 x weekly - 1-3 sets - 10 reps - Wall Clock  - 1 x daily - 7 x weekly - 3 sets - 10 reps   ASSESSMENT:  CLINICAL IMPRESSION:   Continued focus on scapula mobility and shoulder stabilization activities. Significant improvement in ability to keep shoulder depressed with OH activities. Still needing intermittent  tactile and verbal cueing . Less pain with ABD, mainly on return.    OBJECTIVE IMPAIRMENTS: decreased activity tolerance, decreased balance, decreased ROM, decreased strength, increased edema, increased muscle spasms, impaired flexibility, impaired sensation, impaired UE functional use, postural dysfunction, obesity, and pain.    GOALS: Goals reviewed with patient? Yes  SHORT TERM GOALS: Target date: 10/11/2023   Patient will be independent with initial HEP.  Baseline:  Goal status: IN PROGRESS  2. Balance screen performed and appropriate goals set if indicated.  Baseline:  10/01/23: TUG - 8 sec; 5xSTS - 12 sec Goal status: MET    LONG TERM GOALS: Target date: 12/21/23  Patient will be independent with advanced/ongoing HEP to improve outcomes and carryover.  Baseline:  Goal status: IN PROGRESS 11/06/23  2.   Decreased Quick Dash by >= 10 points demonstrating improved  functional ability.  Baseline: 72.7 10/26/23: 25.0 Goal status: MET 10/26/23  3.  Patient to improve R shoulder AROM to [facility] without pain provocation to allow for increased ease of ADLs.  Baseline:  Goal status: IN PROGRESS 11/06/23  4.  Patient will demonstrate improved UE strength to 4+/5 to perform ADLS  Baseline:  Goal status: IN PROGRESS 11/06/23  5. Patient to report no pain in R forearm with ADLs.  Baseline:  Goal status: MET 11/06/23  6.  Patient will score >19/24 on the DGI to decrease fall risk.   Baseline: TBD Goal status: goal d/c   PLAN:  PT FREQUENCY: 2x/week  PT DURATION: 6 weeks  PLANNED INTERVENTIONS: 97164- PT Re-evaluation, 97110-Therapeutic exercises, 97530- Therapeutic  activity, [id number]- Neuromuscular re-education, [phone]- Self Care, [id number]- Manual therapy, [id number]- Electrical stimulation (unattended), 97016- Vasopneumatic device, [phone]- Ionotophoresis 4mg /ml Dexamethasone , Patient/Family education, Balance training, Taping, Dry Needling, Joint mobilization, Spinal mobilization, Cryotherapy, and Moist heat  PLAN FOR NEXT SESSION: Progress scapula mobility & stabilization. Progress scapular strength per protocol; continue strengthening; Note: Prone shoulder strength affected by Melanoma surgery so limited in ability. Pt able to lift up to 20# per MD note.   Mliss Cummins, PT  12/11/23, 2:05 PM

## 2023-12-11 ENCOUNTER — Ambulatory Visit: Admitting: Physical Therapy

## 2023-12-11 ENCOUNTER — Encounter: Payer: Self-pay | Admitting: Physical Therapy

## 2023-12-11 DIAGNOSIS — R2681 Unsteadiness on feet: Secondary | ICD-10-CM

## 2023-12-11 DIAGNOSIS — M79601 Pain in right arm: Secondary | ICD-10-CM

## 2023-12-11 DIAGNOSIS — M25611 Stiffness of right shoulder, not elsewhere classified: Secondary | ICD-10-CM | POA: Diagnosis not present

## 2023-12-11 DIAGNOSIS — M6281 Muscle weakness (generalized): Secondary | ICD-10-CM | POA: Diagnosis not present

## 2023-12-11 DIAGNOSIS — R600 Localized edema: Secondary | ICD-10-CM | POA: Diagnosis not present

## 2023-12-12 NOTE — Therapy (Addendum)
 OUTPATIENT PHYSICAL THERAPY SHOULDER TREATMENT  PHYSICAL THERAPY DISCHARGE SUMMARY  Visits from Start of Care: 19  Current functional level related to goals / functional outcomes: See progress note for discharge status    Remaining deficits: Unknown    Education / Equipment: HEP   Patient agrees to discharge. Patient goals were met. Patient is being discharged due to meeting the stated rehab goals.  Celyn P. Ina PT, MPH 02/14/24 10:56 AM For Mliss JINNY Cummins, PT       Patient Name: David [name] MRN: [id number] DOB:1950/11/05, 73 y.o., male Today's Date: 12/13/2023  END OF SESSION:  PT End of Session - 12/13/23 1148     Visit Number 19    Date for PT Re-Evaluation 12/21/23    Authorization Type [employer] MCR    Authorization - Visit Number 19    Progress Note Due on Visit 21    PT Start Time 1149    PT Stop Time 1235    PT Time Calculation (min) 46 min    Activity Tolerance Patient tolerated treatment well    Behavior During Therapy WFL for tasks assessed/performed           Past Medical History:  Diagnosis Date   Arthritis    Balance problem    Barrett's esophagus    BBB (bundle branch block) 03/15/2016   Carpal tunnel syndrome    DDD (degenerative disc disease), lumbar 03/15/2016   Depression    Dysrhythmia    had some tachycardia with steroids   Elevated cholesterol 03/15/2016   Headache(784.0)    Ocular Migraines - takes Ambien  for it   Heart murmur    HTN (hypertension) 03/15/2016   Hyperlipemia    Hypertension    Malignant melanoma of right side of neck (HCC) 03/15/2016   73 years old   Neuralgia 03/15/2016   Ocular migraine    Osteoarthritis    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    RLS (restless legs syndrome)    Sleep apnea    uses CPAP   Snores    Status post gastric banding    Wears glasses    driving   Past Surgical History:  Procedure Laterality Date    ANTERIOR CERVICAL DECOMPRESSION/DISCECTOMY FUSION 4 LEVELS N/A 02/26/2020   Procedure: ANTERIOR CERVICAL DECOMPRESSION/DISCECTOMY FUSION, INTERBODY PROSTHESIS, PLATE/SCREWS CERVICAL THREE-FOUR CERVICAL FOUR-FIVE CERVICAL FIVE-SIX- CERVICAL SIX-SEVEN;  Surgeon: Mavis Purchase, MD;  Location: [facility] OR;  Service: Neurosurgery;  Laterality: N/A;   BASAL CELL CARCINOMA EXCISION Right    right temple area   CARPAL TUNNEL RELEASE Right 09/26/2012   Procedure: CARPAL TUNNEL RELEASE;  Surgeon: Franky JONELLE Curia, MD;  Location: [facility];  Service: Orthopedics;  Laterality: Right;   CARPAL TUNNEL RELEASE Left 10/24/2012   Procedure: CARPAL TUNNEL RELEASE;  Surgeon: Franky JONELLE Curia, MD;  Location: [facility];  Service: Orthopedics;  Laterality: Left;   CIRCUMCISION     as a baby   EXCISION MELANOMA WITH SENTINEL LYMPH NODE BIOPSY  1984   rt neck with mulpipal nodes and some muscle excised rt neck-shoulder   KNEE ARTHROPLASTY     knee replacement     LAPAROSCOPIC GASTRIC BANDING  03/15/2009   LAPAROSCOPIC GASTRIC BANDING     MUSCLE BIOPSY  2004   rt thigh to r/o myocitis   nerve conduction velosity test     REVERSE SHOULDER ARTHROPLASTY Right 08/21/2023  Procedure: ARTHROPLASTY, SHOULDER, TOTAL, REVERSE;  Surgeon: Addie Cordella Hamilton, MD;  Location: [facility] OR;  Service: Orthopedics;  Laterality: Right;  RIGHT REVERSE SHOULDER ARTHROPLASTY   SHOULDER SURGERY Right 12/2021   TONSILLECTOMY     TOTAL KNEE ARTHROPLASTY Bilateral 12/05/2010   TRIGGER FINGER RELEASE Right 09/26/2012   Procedure: RELEASE TRIGGER FINGER/A-1 PULLEY RING AND SMALL ;  Surgeon: Franky JONELLE Curia, MD;  Location: [facility];  Service: Orthopedics;  Laterality: Right;   TRIGGER FINGER RELEASE Left 10/24/2012   Procedure: RELEASE TRIGGER FINGER/A-1 PULLEY LEFT MIDDLE AND LEFT RING;  Surgeon: Franky JONELLE Curia, MD;  Location: [facility];  Service: Orthopedics;  Laterality: Left;   UPPER GI  ENDOSCOPY     Patient Active Problem List   Diagnosis Date Noted   Rotator cuff arthropathy, right 08/21/2023   S/P reverse total shoulder arthroplasty, right 08/21/2023   Sepsis due to undetermined organism (HCC) 08/20/2022   Cervical spondylosis with radiculopathy 02/26/2020   SBO (small bowel obstruction) (HCC) 08/21/2017   Trochanteric bursitis of left hip 01/18/2017   Lateral epicondylitis, right elbow 01/18/2017   Rheumatoid arthritis (HCC) 03/15/2016   DJD (degenerative joint disease), cervical 03/15/2016   DDD (degenerative disc disease), lumbar 03/15/2016   HTN (hypertension) 03/15/2016   Obesity 03/15/2016   Elevated cholesterol 03/15/2016   Neuralgia 03/15/2016   Malignant melanoma of right side of neck (HCC) 03/15/2016   BBB (bundle branch block) 03/15/2016   High risk medication use 03/15/2016   Osteoarthritis of lumbar spine 03/15/2016   Primary osteoarthritis of both hands 03/15/2016   Primary osteoarthritis of both knees 03/15/2016   Mesenteric adenitis 06/29/2015    PCP: Stamey, Chiquita POUR, FNP   REFERRING PROVIDER: Shirly Carlin CROME, PA-C   REFERRING DIAG: [phone] (ICD-10-CM) - S/P reverse total shoulder arthroplasty, Right (With subscapularis repair)  THERAPY DIAG:  Stiffness of right shoulder, not elsewhere classified  Muscle weakness (generalized)  Unsteadiness on feet  Pain in right arm  Localized edema  Rationale for Evaluation and Treatment: Rehabilitation  ONSET DATE: 08/21/23 DOS  SUBJECTIVE:                                                                                                                                                                                      SUBJECTIVE STATEMENT: Sore from exercises at home.   12/11/23: 17 weeks post op Hand dominance: Right  PERTINENT HISTORY: H/o falls,  ACDF, B TKA, HTN, h/o malignant melanoma in R neck affecting R trapezius muscle and R UE ROM  PAIN:  Are you having pain?  No  PRECAUTIONS: Shoulder As of 10/03/23: Able to lift up to but  not beyond 20#.  At Eval: Okay to very lightly lift with the operative extremity but no lifting anything heavier than a coffee cup or cell phone. Start physical therapy to focus on passive range of motion and active range of motion with deltoid isometrics. Do not want to externally rotate past 30 degrees to protect subscapularis repair.   RED FLAGS: None   WEIGHT BEARING RESTRICTIONS: Yes R shoulder  FALLS:  Has patient fallen in last 6 months? Yes. Number of falls 4 tripping over things in cluttered garage  LIVING ENVIRONMENT: Lives with: lives with their spouse Lives in: House/apartment Stairs: Yes: Internal: 15 steps; on left going up and bilateral but cannot reach both and External: 3 steps; no railing on deck Has following equipment at home: Single point cane and Crutches  OCCUPATION: retired  PLOF: Independent  PATIENT GOALS:to be able to sleep in bed on side without pain, currently on couch or in bed on back  NEXT MD VISIT: Early June Billi)  OBJECTIVE:  Note: Objective measures were completed at Evaluation unless otherwise noted.  DIAGNOSTIC FINDINGS:  none  PATIENT SURVEYS:  Quick Dash  72.7 / 100 = 72.7 %  11/06/23  36.4 / 100 = 36.4 %  COGNITION: Overall cognitive status: Within functional limits for tasks assessed     SENSATION: Mild pull in post distal/lateral  wrist  POSTURE: Rounded shoulders  UPPER EXTREMITY ROM:   A/P ROM Right eval Left eval Right 5/8//25 Right 5/27 Right 10/26/23 Right  6/10  Shoulder flexion 130/142   150 active supine 70 active supine 150 active supine  Shoulder extension        Shoulder abduction 100 P   118 active 111 active supine   Shoulder adduction        Shoulder internal rotation    65    Shoulder external rotation 30/30   50    Elbow flexion        Elbow extension -23  -16 -11/0 -2 AAROM   Wrist flexion WFL       Wrist extension        Wrist  ulnar deviation        Wrist radial deviation        Wrist pronation        Wrist supination        (Blank rows = not tested) R wrist limited since melanoma surgery  A/P ROM Right 6/12 Right 6/17  Shoulder flexion 150 supine 153 supine 79 deg sitting  Shoulder extension    Shoulder abduction  110  Shoulder adduction    Shoulder internal rotation  48  Shoulder external rotation  70  Elbow flexion    Elbow extension    Wrist flexion WFL   Wrist extension    Wrist ulnar deviation    Wrist radial deviation    Wrist pronation    Wrist supination        UPPER EXTREMITY MMT: NT due to surgery  PALPATION:  TTP in ant and post shoulder, wrist extensors    [facility] Adult PT Treatment:                                                DATE: 12/13/2023 Neuromuscular re-ed: S/L shoulder flexion & abduction S/L ER x 20 Bent over scapular retraction  Therapeutic Activity: Standing wall slides in  flex  x10 each  Shoulder flexion and scaption 1# wt 3x5 Blue weighted ball B biceps curl to OH press  Bent over lawn mower with red tband x 10 cues to use legs and trunk for assist Bow and arrow in door red x 10 cues to rotate body at end range Self Care: POC and ADLs discussed - power washing, gun range   [facility] Adult PT Treatment:                                                DATE: 12/11/2023 Neuromuscular re-ed: S/L shoulder flexion & abduction Wall clock at wall (Rt)  Shoulder flexion  no assist required today Bent over scapular retraction to left back pocket Bent over resisted scap retraction holding 5#DB --> tactile cue on scapula Bent over horizontal ABD (less than 90 deg ) - difficult without TC to depress scapula Serratus push bent over on table B and unilaterally Standing elbow extension with towel behind elbow and blocking anterior shoulder Therapeutic Activity: Standing table slides in flexion and ABD + tactile cues for shoulder stability Standing wall slides in ABD x10 each -  pain on return  [facility] Adult PT Treatment:                                                DATE: 12/06/2023 Neuromuscular re-ed: S/L shoulder flexion & abduction + tactile cues for scapulohumeral assist Wall clock at wall (Rt)  Shoulder flexion + black super band providing shoulder stablization Bent over resisted scap retraction holding 5#DB --> tactile cue on scapula Therapeutic Activity: Standing table slides in flexion + tactile cues for shoulder stability Standing wall slides in flexion & scaption x10 each --> attempted abduction but discontinued d/t pain   [facility] Adult PT Treatment:                                                DATE: 11/30/2023 Therapeutic Exercise: Supine:  Shoulder flexion + RTB  Shoulder abd + RTB Seated UT pin & stretch (Lt) Manual Therapy: STM RTC, posterior shoulder girdle, medial scapula borders Neuromuscular re-ed: Inclined supine --> shoulder flexion & abduction + YTB --> tactile cues for scap stability Seated & Side Lying --> shoulder flexion & abduction + tactile cues for scapulohumeral assist    [facility] Adult PT Treatment:                                                DATE: 11/15/2023 Therapeutic Exercise: Green shoulder ext 2x10 Blue Row 3x10 Standing shoulder flex and ABD with bent arm  2 x 10 ea  using mirror Standing wall slide flexion with 2#AW 2x 10 challenging Fitter black band x 20 forward flexion Standing flex red (punch) x 20 Standing IR red x 20 Standing ER walkout  x 20  Neuromuscular re-ed: S/L flexion 2# x 10, 3# x 20 S/L ER x 20 (slightly rolled backward) S/L ABD 1# wt x 10  ea palm down and thumb up S/L horizontal ABD 1# 2 x 10 Prone triceps ext 2# x 10  Prone T no wt x 10 Prone row 3# stopped  due to pain Supine shoulder circles 3# Supine shoulder flexion  x 20 Flex with 2# wt to 90 R only with scap retraction - use support under arm x 20 Supine ER with 2# wt on towel  with PT providing stop for end range x 20  Scaption x 20                                                                         PATIENT EDUCATION: Education details: Updated HEP  Person educated: Patient Education method: Explanation, Demonstration, and Handouts Education comprehension: verbalized understanding and returned demonstration  HOME EXERCISE PROGRAM: Access Code: [email or web address] URL: [email or web address] Date: 12/06/2023 Prepared by: Lamarr Price  Exercises - Seated Shoulder External Rotation AAROM with Cane and Hand in Neutral  - 1-2 x daily - 3 x weekly - 2 sets - 10 reps - Supine Shoulder Flexion Extension AAROM with Dowel  - 2 x daily - 7 x weekly - 1-3 sets - 10 reps - Wrist Flexion Extension AROM with Fingers Curled and Palm Down  - 2 x daily - 7 x weekly - 2 sets - 10 reps - Forearm Pronation and Supination With Arm Supported  - 2 x daily - 7 x weekly - 2 sets - 10 reps - Seated Scapular Retraction  - 1 x daily - 7 x weekly - 3 sets - 10 reps - Isometric Shoulder Abduction at Wall  - 1 x daily - 7 x weekly - 1 sets - 10 reps - 3 seconds hold - Isometric Shoulder Extension at Wall  - 1 x daily - 7 x weekly - 1 sets - 10 reps - 3 seconds hold - Isometric Shoulder Flexion at Wall  - 1 x daily - 7 x weekly - 1 sets - 10 reps - 3 seconds hold - Sidelying Shoulder Flexion 15 Degrees (Mirrored)  - 1 x daily - 3-4 x weekly - 1-3 sets - 10 reps - Sidelying Shoulder Horizontal Abduction  - 1 x daily - 3-4 x weekly - 1-3 sets - 10 reps - Prone Elbow Extension with Dumbbell  - 1 x daily - 3 x weekly - 1-3 sets - 10 reps - Prone Shoulder Extension - Single Arm  - 1 x daily - 3 x weekly - 2 sets - 10 reps - Prone Single Arm Shoulder Horizontal Abduction with Scapular Retraction and Palm Down (Mirrored)  - 1 x daily - 3 x weekly - 1-3 sets - 10 reps - Wall Clock  - 1 x daily - 7 x weekly - 3 sets - 10 reps   ASSESSMENT:  CLINICAL IMPRESSION:   Patient demonstrates improved scapular strength with shoulder flex and scaption. We  worked on functional activities today and strengthening of the shoulder. Patient demonstrates good scapular control and is much more cognizant of it when performing TE. Patient feels he is ready to wrap up PT. Plan to see him one more visit to assess goals and finalize HEP.    OBJECTIVE IMPAIRMENTS: decreased activity tolerance, decreased balance, decreased ROM, decreased  strength, increased edema, increased muscle spasms, impaired flexibility, impaired sensation, impaired UE functional use, postural dysfunction, obesity, and pain.    GOALS: Goals reviewed with patient? Yes  SHORT TERM GOALS: Target date: 10/11/2023   Patient will be independent with initial HEP.  Baseline:  Goal status: IN PROGRESS  2. Balance screen performed and appropriate goals set if indicated.  Baseline:  10/01/23: TUG - 8 sec; 5xSTS - 12 sec Goal status: MET    LONG TERM GOALS: Target date: 12/21/23  Patient will be independent with advanced/ongoing HEP to improve outcomes and carryover.  Baseline:  Goal status: IN PROGRESS 11/06/23  2.   Decreased Quick Dash by >= 10 points demonstrating improved functional ability.  Baseline: 72.7 10/26/23: 25.0 Goal status: MET 10/26/23  3.  Patient to improve R shoulder AROM to [facility] without pain provocation to allow for increased ease of ADLs.  Baseline:  Goal status: IN PROGRESS 11/06/23  4.  Patient will demonstrate improved UE strength to 4+/5 to perform ADLS  Baseline:  Goal status: IN PROGRESS 11/06/23  5. Patient to report no pain in R forearm with ADLs.  Baseline:  Goal status: MET 11/06/23  6.  Patient will score >19/24 on the DGI to decrease fall risk.   Baseline: TBD Goal status: goal d/c   PLAN:  PT FREQUENCY: 2x/week  PT DURATION: 6 weeks  PLANNED INTERVENTIONS: 97164- PT Re-evaluation, 97110-Therapeutic exercises, 97530- Therapeutic activity, [id number]- Neuromuscular re-education, 97535- Self Care, [id number]- Manual therapy, [id number]- Electrical stimulation  (unattended), 97016- Vasopneumatic device, 97033- Ionotophoresis 4mg /ml Dexamethasone , Patient/Family education, Balance training, Taping, Dry Needling, Joint mobilization, Spinal mobilization, Cryotherapy, and Moist heat  PLAN FOR NEXT SESSION: Check goals, finalize HEP with functional strengthening and d/c.   Mliss Cummins, PT  12/13/23, 12:47 PM

## 2023-12-13 ENCOUNTER — Encounter: Payer: Self-pay | Admitting: Physical Therapy

## 2023-12-13 ENCOUNTER — Ambulatory Visit (INDEPENDENT_AMBULATORY_CARE_PROVIDER_SITE_OTHER): Admitting: Physical Therapy

## 2023-12-13 DIAGNOSIS — R600 Localized edema: Secondary | ICD-10-CM

## 2023-12-13 DIAGNOSIS — M25611 Stiffness of right shoulder, not elsewhere classified: Secondary | ICD-10-CM

## 2023-12-13 DIAGNOSIS — M6281 Muscle weakness (generalized): Secondary | ICD-10-CM

## 2023-12-13 DIAGNOSIS — R2681 Unsteadiness on feet: Secondary | ICD-10-CM | POA: Diagnosis not present

## 2023-12-13 DIAGNOSIS — M79601 Pain in right arm: Secondary | ICD-10-CM

## 2023-12-17 ENCOUNTER — Other Ambulatory Visit: Payer: Self-pay

## 2023-12-17 DIAGNOSIS — K080 Exfoliation of teeth due to systemic causes: Secondary | ICD-10-CM | POA: Diagnosis not present

## 2023-12-17 DIAGNOSIS — M0609 Rheumatoid arthritis without rheumatoid factor, multiple sites: Secondary | ICD-10-CM

## 2023-12-17 MED ORDER — HYDROXYCHLOROQUINE SULFATE 200 MG PO TABS
200.0000 mg | ORAL_TABLET | Freq: Two times a day (BID) | ORAL | 0 refills | Status: DC
Start: 2023-12-17 — End: 2024-02-29

## 2023-12-17 NOTE — Telephone Encounter (Signed)
 Last Fill: 08/23/2023  Eye exam: 11/10/2022 WNL   Labs: 09/05/2023 Glucose is 151. Rest of CMP WNL.   RBC count and hemoglobin are borderline low. Rest of CBC WNL.  We will continue to monitor closely.   Next Visit: 04/09/2024  Last Visit: 11/08/2023  DX: Rheumatoid arthritis of multiple sites with negative rheumatoid factor   Current Dose per office note 11/08/2023: plaquenil  200 mg 1 tablet twice daily   Okay to refill Plaquenil ?

## 2023-12-24 ENCOUNTER — Ambulatory Visit: Admitting: Surgical

## 2023-12-24 DIAGNOSIS — M75122 Complete rotator cuff tear or rupture of left shoulder, not specified as traumatic: Secondary | ICD-10-CM | POA: Diagnosis not present

## 2023-12-24 DIAGNOSIS — Z96611 Presence of right artificial shoulder joint: Secondary | ICD-10-CM | POA: Diagnosis not present

## 2023-12-24 NOTE — Therapy (Incomplete)
 OUTPATIENT PHYSICAL THERAPY SHOULDER TREATMENT AND DISCHARGE SUMMARY     Patient Name: David [name] MRN: [id number] DOB:30-Jul-1950, 73 y.o., male Today's Date: 12/24/2023  END OF SESSION:     Past Medical History:  Diagnosis Date   Arthritis    Balance problem    Barrett's esophagus    BBB (bundle branch block) 03/15/2016   Carpal tunnel syndrome    DDD (degenerative disc disease), lumbar 03/15/2016   Depression    Dysrhythmia    had some tachycardia with steroids   Elevated cholesterol 03/15/2016   Headache(784.0)    Ocular Migraines - takes Ambien  for it   Heart murmur    HTN (hypertension) 03/15/2016   Hyperlipemia    Hypertension    Malignant melanoma of right side of neck (HCC) 03/15/2016   73 years old   Neuralgia 03/15/2016   Ocular migraine    Osteoarthritis    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    RLS (restless legs syndrome)    Sleep apnea    uses CPAP   Snores    Status post gastric banding    Wears glasses    driving   Past Surgical History:  Procedure Laterality Date   ANTERIOR CERVICAL DECOMPRESSION/DISCECTOMY FUSION 4 LEVELS N/A 02/26/2020   Procedure: ANTERIOR CERVICAL DECOMPRESSION/DISCECTOMY FUSION, INTERBODY PROSTHESIS, PLATE/SCREWS CERVICAL THREE-FOUR CERVICAL FOUR-FIVE CERVICAL FIVE-SIX- CERVICAL SIX-SEVEN;  Surgeon: Mavis Purchase, MD;  Location: [facility] OR;  Service: Neurosurgery;  Laterality: N/A;   BASAL CELL CARCINOMA EXCISION Right    right temple area   CARPAL TUNNEL RELEASE Right 09/26/2012   Procedure: CARPAL TUNNEL RELEASE;  Surgeon: Franky JONELLE Curia, MD;  Location: [facility];  Service: Orthopedics;  Laterality: Right;   CARPAL TUNNEL RELEASE Left 10/24/2012   Procedure: CARPAL TUNNEL RELEASE;  Surgeon: Franky JONELLE Curia, MD;  Location: [facility];  Service: Orthopedics;  Laterality: Left;   CIRCUMCISION     as a baby   EXCISION  MELANOMA WITH SENTINEL LYMPH NODE BIOPSY  1984   rt neck with mulpipal nodes and some muscle excised rt neck-shoulder   KNEE ARTHROPLASTY     knee replacement     LAPAROSCOPIC GASTRIC BANDING  03/15/2009   LAPAROSCOPIC GASTRIC BANDING     MUSCLE BIOPSY  2004   rt thigh to r/o myocitis   nerve conduction velosity test     REVERSE SHOULDER ARTHROPLASTY Right 08/21/2023   Procedure: ARTHROPLASTY, SHOULDER, TOTAL, REVERSE;  Surgeon: Addie Cordella Hamilton, MD;  Location: [facility] OR;  Service: Orthopedics;  Laterality: Right;  RIGHT REVERSE SHOULDER ARTHROPLASTY   SHOULDER SURGERY Right 12/2021   TONSILLECTOMY     TOTAL KNEE ARTHROPLASTY Bilateral 12/05/2010   TRIGGER FINGER RELEASE Right 09/26/2012   Procedure: RELEASE TRIGGER FINGER/A-1 PULLEY RING AND SMALL ;  Surgeon: Franky JONELLE Curia, MD;  Location: [facility];  Service: Orthopedics;  Laterality: Right;   TRIGGER FINGER RELEASE Left 10/24/2012   Procedure: RELEASE TRIGGER FINGER/A-1 PULLEY LEFT MIDDLE AND LEFT RING;  Surgeon: Franky JONELLE Curia, MD;  Location: [facility];  Service: Orthopedics;  Laterality: Left;   UPPER GI ENDOSCOPY     Patient Active Problem List   Diagnosis Date Noted   Rotator cuff arthropathy, right 08/21/2023   S/P reverse total shoulder arthroplasty, right 08/21/2023   Sepsis due to undetermined organism (HCC) 08/20/2022   Cervical spondylosis with radiculopathy 02/26/2020  SBO (small bowel obstruction) (HCC) 08/21/2017   Trochanteric bursitis of left hip 01/18/2017   Lateral epicondylitis, right elbow 01/18/2017   Rheumatoid arthritis (HCC) 03/15/2016   DJD (degenerative joint disease), cervical 03/15/2016   DDD (degenerative disc disease), lumbar 03/15/2016   HTN (hypertension) 03/15/2016   Obesity 03/15/2016   Elevated cholesterol 03/15/2016   Neuralgia 03/15/2016   Malignant melanoma of right side of neck (HCC) 03/15/2016   BBB (bundle branch block) 03/15/2016   High risk medication use  03/15/2016   Osteoarthritis of lumbar spine 03/15/2016   Primary osteoarthritis of both hands 03/15/2016   Primary osteoarthritis of both knees 03/15/2016   Mesenteric adenitis 06/29/2015    PCP: Stamey, Chiquita POUR, FNP   REFERRING PROVIDER: Shirly Carlin CROME, PA-C   REFERRING DIAG: [phone] (ICD-10-CM) - S/P reverse total shoulder arthroplasty, Right (With subscapularis repair)  THERAPY DIAG:  No diagnosis found.  Rationale for Evaluation and Treatment: Rehabilitation  ONSET DATE: 08/21/23 DOS  SUBJECTIVE:                                                                                                                                                                                      SUBJECTIVE STATEMENT: ***  12/11/23: 17 weeks post op Hand dominance: Right  PERTINENT HISTORY: H/o falls,  ACDF, B TKA, HTN, h/o malignant melanoma in R neck affecting R trapezius muscle and R UE ROM  PAIN:  Are you having pain? No  PRECAUTIONS: Shoulder As of 10/03/23: Able to lift up to but not beyond 20#.  At Eval: Okay to very lightly lift with the operative extremity but no lifting anything heavier than a coffee cup or cell phone. Start physical therapy to focus on passive range of motion and active range of motion with deltoid isometrics. Do not want to externally rotate past 30 degrees to protect subscapularis repair.   RED FLAGS: None   WEIGHT BEARING RESTRICTIONS: Yes R shoulder  FALLS:  Has patient fallen in last 6 months? Yes. Number of falls 4 tripping over things in cluttered garage  LIVING ENVIRONMENT: Lives with: lives with their spouse Lives in: House/apartment Stairs: Yes: Internal: 15 steps; on left going up and bilateral but cannot reach both and External: 3 steps; no railing on deck Has following equipment at home: Single point cane and Crutches  OCCUPATION: retired  PLOF: Independent  PATIENT GOALS:to be able to sleep in bed on side without pain, currently on  couch or in bed on back  NEXT MD VISIT: Early June Billi)  OBJECTIVE:  Note: Objective measures were completed at Evaluation unless otherwise noted.  DIAGNOSTIC FINDINGS:  none  PATIENT SURVEYS:  Quick  Dash  72.7 / 100 = 72.7 %  11/06/23  36.4 / 100 = 36.4 %  COGNITION: Overall cognitive status: Within functional limits for tasks assessed     SENSATION: Mild pull in post distal/lateral  wrist  POSTURE: Rounded shoulders  UPPER EXTREMITY ROM:   A/P ROM Right eval Left eval Right 5/8//25 Right 5/27 Right 10/26/23 Right  6/10  Shoulder flexion 130/142   150 active supine 70 active supine 150 active supine  Shoulder extension        Shoulder abduction 100 P   118 active 111 active supine   Shoulder adduction        Shoulder internal rotation    65    Shoulder external rotation 30/30   50    Elbow flexion        Elbow extension -23  -16 -11/0 -2 AAROM   Wrist flexion WFL       Wrist extension        Wrist ulnar deviation        Wrist radial deviation        Wrist pronation        Wrist supination        (Blank rows = not tested) R wrist limited since melanoma surgery  A/P ROM Right 6/12 Right 6/17 Right 8/5  Shoulder flexion 150 supine 153 supine 79 deg sitting   Shoulder extension     Shoulder abduction  110   Shoulder adduction     Shoulder internal rotation  48   Shoulder external rotation  70   Elbow flexion     Elbow extension     Wrist flexion WFL    Wrist extension     Wrist ulnar deviation     Wrist radial deviation     Wrist pronation     Wrist supination         UPPER EXTREMITY MMT: NT due to surgery    12/25/23  PALPATION:  TTP in ant and post shoulder, wrist extensors    [facility] Adult PT Treatment:                                                DATE: 12/25/2023 Neuromuscular re-ed: S/L shoulder flexion & abduction S/L ER x 20 Bent over scapular retraction  Therapeutic Activity: ROM AND MMT Standing wall slides in flex  x10 each   Shoulder flexion and scaption 1# wt 3x5 Blue weighted ball B biceps curl to [employer] over lawn mower with red tband x 10 cues to use legs and trunk for assist Bow and arrow in door red x 10 cues to rotate body at end range Self Care: POC and ADLs discussed - power washing, gun range   [facility] Adult PT Treatment:                                                DATE: 12/13/2023 Neuromuscular re-ed: S/L shoulder flexion & abduction S/L ER x 20 Bent over scapular retraction  Therapeutic Activity: Standing wall slides in flex  x10 each  Shoulder flexion and scaption 1# wt 3x5 Blue weighted ball B biceps curl to [employer] over lawn mower with red tband  x 10 cues to use legs and trunk for assist Bow and arrow in door red x 10 cues to rotate body at end range Self Care: POC and ADLs discussed - power washing, gun range   [facility] Adult PT Treatment:                                                DATE: 12/11/2023 Neuromuscular re-ed: S/L shoulder flexion & abduction Wall clock at wall (Rt)  Shoulder flexion  no assist required today Bent over scapular retraction to left back pocket Bent over resisted scap retraction holding 5#DB --> tactile cue on scapula Bent over horizontal ABD (less than 90 deg ) - difficult without TC to depress scapula Serratus push bent over on table B and unilaterally Standing elbow extension with towel behind elbow and blocking anterior shoulder Therapeutic Activity: Standing table slides in flexion and ABD + tactile cues for shoulder stability Standing wall slides in ABD x10 each - pain on return  [facility] Adult PT Treatment:                                                DATE: 12/06/2023 Neuromuscular re-ed: S/L shoulder flexion & abduction + tactile cues for scapulohumeral assist Wall clock at wall (Rt)  Shoulder flexion + black super band providing shoulder stablization Bent over resisted scap retraction holding 5#DB --> tactile cue on scapula Therapeutic  Activity: Standing table slides in flexion + tactile cues for shoulder stability Standing wall slides in flexion & scaption x10 each --> attempted abduction but discontinued d/t pain   [facility] Adult PT Treatment:                                                DATE: 11/30/2023 Therapeutic Exercise: Supine:  Shoulder flexion + RTB  Shoulder abd + RTB Seated UT pin & stretch (Lt) Manual Therapy: STM RTC, posterior shoulder girdle, medial scapula borders Neuromuscular re-ed: Inclined supine --> shoulder flexion & abduction + YTB --> tactile cues for scap stability Seated & Side Lying --> shoulder flexion & abduction + tactile cues for scapulohumeral assist                                                                        PATIENT EDUCATION: Education details: Updated HEP  Person educated: Patient Education method: Explanation, Demonstration, and Handouts Education comprehension: verbalized understanding and returned demonstration  HOME EXERCISE PROGRAM: Access Code: [email or web address] URL: [email or web address] Date: 12/06/2023 Prepared by: Lamarr Price  Exercises - Seated Shoulder External Rotation AAROM with Cane and Hand in Neutral  - 1-2 x daily - 3 x weekly - 2 sets - 10 reps - Supine Shoulder Flexion Extension AAROM with Dowel  - 2 x daily - 7 x weekly - 1-3 sets - 10 reps - Wrist Flexion Extension AROM with  Fingers Curled and Palm Down  - 2 x daily - 7 x weekly - 2 sets - 10 reps - Forearm Pronation and Supination With Arm Supported  - 2 x daily - 7 x weekly - 2 sets - 10 reps - Seated Scapular Retraction  - 1 x daily - 7 x weekly - 3 sets - 10 reps - Isometric Shoulder Abduction at Wall  - 1 x daily - 7 x weekly - 1 sets - 10 reps - 3 seconds hold - Isometric Shoulder Extension at Wall  - 1 x daily - 7 x weekly - 1 sets - 10 reps - 3 seconds hold - Isometric Shoulder Flexion at Wall  - 1 x daily - 7 x weekly - 1 sets - 10 reps - 3 seconds hold - Sidelying Shoulder  Flexion 15 Degrees (Mirrored)  - 1 x daily - 3-4 x weekly - 1-3 sets - 10 reps - Sidelying Shoulder Horizontal Abduction  - 1 x daily - 3-4 x weekly - 1-3 sets - 10 reps - Prone Elbow Extension with Dumbbell  - 1 x daily - 3 x weekly - 1-3 sets - 10 reps - Prone Shoulder Extension - Single Arm  - 1 x daily - 3 x weekly - 2 sets - 10 reps - Prone Single Arm Shoulder Horizontal Abduction with Scapular Retraction and Palm Down (Mirrored)  - 1 x daily - 3 x weekly - 1-3 sets - 10 reps - Wall Clock  - 1 x daily - 7 x weekly - 3 sets - 10 reps   ASSESSMENT:  CLINICAL IMPRESSION:   ***   OBJECTIVE IMPAIRMENTS: decreased activity tolerance, decreased balance, decreased ROM, decreased strength, increased edema, increased muscle spasms, impaired flexibility, impaired sensation, impaired UE functional use, postural dysfunction, obesity, and pain.    GOALS: Goals reviewed with patient? Yes  SHORT TERM GOALS: Target date: 10/11/2023   Patient will be independent with initial HEP.  Baseline:  Goal status: IN PROGRESS  2. Balance screen performed and appropriate goals set if indicated.  Baseline:  10/01/23: TUG - 8 sec; 5xSTS - 12 sec Goal status: MET    LONG TERM GOALS: Target date: 12/21/23  Patient will be independent with advanced/ongoing HEP to improve outcomes and carryover.  Baseline:  Goal status: IN PROGRESS 11/06/23  2.   Decreased Quick Dash by >= 10 points demonstrating improved functional ability.  Baseline: 72.7 10/26/23: 25.0 Goal status: MET 10/26/23  3.  Patient to improve R shoulder AROM to [facility] without pain provocation to allow for increased ease of ADLs.  Baseline:  Goal status: IN PROGRESS 11/06/23  4.  Patient will demonstrate improved UE strength to 4+/5 to perform ADLS  Baseline:  Goal status: IN PROGRESS 11/06/23  5. Patient to report no pain in R forearm with ADLs.  Baseline:  Goal status: MET 11/06/23  6.  Patient will score >19/24 on the DGI to decrease fall  risk.   Baseline: TBD Goal status: goal d/c   PLAN:  PT FREQUENCY: 2x/week  PT DURATION: 6 weeks  PLANNED INTERVENTIONS: 02835- PT Re-evaluation, 97110-Therapeutic exercises, 97530- Therapeutic activity, 97112- Neuromuscular re-education, 97535- Self Care, 02859- Manual therapy, G0283- Electrical stimulation (unattended), 97016- Vasopneumatic device, 97033- Ionotophoresis 4mg /ml Dexamethasone , Patient/Family education, Balance training, Taping, Dry Needling, Joint mobilization, Spinal mobilization, Cryotherapy, and Moist heat  PLAN FOR NEXT SESSION: see d/c summary  Mliss Cummins, PT  12/24/23, 9:27 PM  PHYSICAL THERAPY DISCHARGE SUMMARY  Visits from Start of Care:  20  Current functional level related to goals / functional outcomes: See above   Remaining deficits: See above   Education / Equipment: HEP   Patient agrees to discharge. Patient goals were {OP Goals:25702::met}. Patient is being discharged due to {OP Discharge Reasons:25703::meeting the stated rehab goals.}   Mliss Cummins, PT 12/24/23 9:32 PM  [facility] at [facility] [address] [place], [place] [address]  [phone] (office) [phone] (fax)

## 2023-12-25 ENCOUNTER — Ambulatory Visit: Payer: Self-pay | Admitting: Physical Therapy

## 2023-12-26 DIAGNOSIS — H5213 Myopia, bilateral: Secondary | ICD-10-CM | POA: Diagnosis not present

## 2023-12-30 ENCOUNTER — Encounter: Payer: Self-pay | Admitting: Surgical

## 2023-12-30 NOTE — Progress Notes (Signed)
 Post-Op Visit Note   Patient: David [name]           Date of Birth: [birth year]           MRN: [id number] Visit Date: 12/24/2023 PCP: Crecencio Chiquita POUR, FNP   Assessment & Plan:  Chief Complaint:  Chief Complaint  Patient presents with   Right Shoulder - Routine Post Op, Follow-up    08/21/2023 Right RSA   Left Shoulder - Pain, Follow-up   Visit Diagnoses:  1. Complete tear of left rotator cuff, unspecified whether traumatic   2. S/P reverse total shoulder arthroplasty, right     Plan: Patient is a 73 male who presents s/p right reverse shoulder arthroplasty on/1/25.  Overall his right shoulder is doing well and he is pleased with how he is doing.  He is continuing to do some therapy exercises.  Feels more functional with the arm than he did prior to surgery.  On exam, he has 50 degrees X rotation, 70 degrees abduction, 110 degrees forward elevation passively.  Incision looks to be healing well.  There is 1 area of slight eschar just medial to the actual incision.  There is no expressible drainage or any surrounding redness.  Does not appear to be sinus tract.  Overall he is doing well regarding his right shoulder and he will continue with therapy exercises and follow-up as needed.  Regarding the left shoulder, he has retracted subscapularis tear.  Wants to hold off on any surgical intervention as long as he can.  Had good relief from glenohumeral injection several months ago and would like to just repeat these as needed.  On exam he has 85 degrees X rotation, 90 degrees abduction, 180 degrees forward elevation passively and actively.  Good rotator cuff strength of supra, infra rated 5/5.  Subscap strength rated 3/5.  2+ radial pulse of the left and right upper extremities.  Axillary nerve intact with deltoid firing of the left shoulder and right shoulder.  Plan follow-up as needed and he can return for glenohumeral injections in the left shoulder every 4 months as needed  Follow-Up  Instructions: No follow-ups on file.   Orders:  No orders of the defined types were placed in this encounter.  No orders of the defined types were placed in this encounter.   Imaging: No results found.  PMFS History: Patient Active Problem List   Diagnosis Date Noted   Rotator cuff arthropathy, right 08/21/2023   S/P reverse total shoulder arthroplasty, right 08/21/2023   Sepsis due to undetermined organism (HCC) 08/20/2022   Cervical spondylosis with radiculopathy 02/26/2020   SBO (small bowel obstruction) (HCC) 08/21/2017   Trochanteric bursitis of left hip 01/18/2017   Lateral epicondylitis, right elbow 01/18/2017   Rheumatoid arthritis (HCC) 03/15/2016   DJD (degenerative joint disease), cervical 03/15/2016   DDD (degenerative disc disease), lumbar 03/15/2016   HTN (hypertension) 03/15/2016   Obesity 03/15/2016   Elevated cholesterol 03/15/2016   Neuralgia 03/15/2016   Malignant melanoma of right side of neck (HCC) 03/15/2016   BBB (bundle branch block) 03/15/2016   High risk medication use 03/15/2016   Osteoarthritis of lumbar spine 03/15/2016   Primary osteoarthritis of both hands 03/15/2016   Primary osteoarthritis of both knees 03/15/2016   Mesenteric adenitis 06/29/2015   Past Medical History:  Diagnosis Date   Arthritis    Balance problem    Barrett's esophagus    BBB (bundle branch block) 03/15/2016   Carpal tunnel syndrome  DDD (degenerative disc disease), lumbar 03/15/2016   Depression    Dysrhythmia    had some tachycardia with steroids   Elevated cholesterol 03/15/2016   Headache(784.0)    Ocular Migraines - takes Ambien  for it   Heart murmur    HTN (hypertension) 03/15/2016   Hyperlipemia    Hypertension    Malignant melanoma of right side of neck (HCC) 03/15/2016   73 years old   Neuralgia 03/15/2016   Ocular migraine    Osteoarthritis    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    RLS (restless legs syndrome)    Sleep apnea    uses CPAP   Snores    Status post gastric banding    Wears glasses    driving    Family History  Problem Relation Age of Onset   Heart disease Mother    Alzheimer's disease Father     Past Surgical History:  Procedure Laterality Date   ANTERIOR CERVICAL DECOMPRESSION/DISCECTOMY FUSION 4 LEVELS N/A 02/26/2020   Procedure: ANTERIOR CERVICAL DECOMPRESSION/DISCECTOMY FUSION, INTERBODY PROSTHESIS, PLATE/SCREWS CERVICAL THREE-FOUR CERVICAL FOUR-FIVE CERVICAL FIVE-SIX- CERVICAL SIX-SEVEN;  Surgeon: Mavis Purchase, MD;  Location: [facility] OR;  Service: Neurosurgery;  Laterality: N/A;   BASAL CELL CARCINOMA EXCISION Right    right temple area   CARPAL TUNNEL RELEASE Right 09/26/2012   Procedure: CARPAL TUNNEL RELEASE;  Surgeon: Franky JONELLE Curia, MD;  Location: [facility];  Service: Orthopedics;  Laterality: Right;   CARPAL TUNNEL RELEASE Left 10/24/2012   Procedure: CARPAL TUNNEL RELEASE;  Surgeon: Franky JONELLE Curia, MD;  Location: [facility];  Service: Orthopedics;  Laterality: Left;   CIRCUMCISION     as a baby   EXCISION MELANOMA WITH SENTINEL LYMPH NODE BIOPSY  1984   rt neck with mulpipal nodes and some muscle excised rt neck-shoulder   KNEE ARTHROPLASTY     knee replacement     LAPAROSCOPIC GASTRIC BANDING  03/15/2009   LAPAROSCOPIC GASTRIC BANDING     MUSCLE BIOPSY  2004   rt thigh to r/o myocitis   nerve conduction velosity test     REVERSE SHOULDER ARTHROPLASTY Right 08/21/2023   Procedure: ARTHROPLASTY, SHOULDER, TOTAL, REVERSE;  Surgeon: Addie Cordella Hamilton, MD;  Location: [facility] OR;  Service: Orthopedics;  Laterality: Right;  RIGHT REVERSE SHOULDER ARTHROPLASTY   SHOULDER SURGERY Right 12/2021   TONSILLECTOMY     TOTAL KNEE ARTHROPLASTY Bilateral 12/05/2010   TRIGGER FINGER RELEASE Right 09/26/2012   Procedure: RELEASE TRIGGER FINGER/A-1 PULLEY RING AND SMALL ;  Surgeon: Franky JONELLE Curia, MD;   Location: [facility];  Service: Orthopedics;  Laterality: Right;   TRIGGER FINGER RELEASE Left 10/24/2012   Procedure: RELEASE TRIGGER FINGER/A-1 PULLEY LEFT MIDDLE AND LEFT RING;  Surgeon: Franky JONELLE Curia, MD;  Location: [facility];  Service: Orthopedics;  Laterality: Left;   UPPER GI ENDOSCOPY     Social History   Occupational History   Not on file  Tobacco Use   Smoking status: Former    Current packs/day: 0.00    Average packs/day: 1 pack/day for 30.0 years (30.0 ttl pk-yrs)    Types: Cigarettes    Start date: 09/24/1971    Quit date: 09/23/2001    Years since quitting: 22.2    Passive exposure: Never   Smokeless tobacco: Never  Vaping Use   Vaping status: Never Used  Substance and Sexual Activity  Alcohol use: Yes    Comment: maybe one drink once a month   Drug use: No   Sexual activity: Not on file

## 2024-01-14 ENCOUNTER — Other Ambulatory Visit: Payer: Self-pay

## 2024-01-14 MED ORDER — TUBERCULIN-ALLERGY SYRINGES 27G X 1/2" 1 ML MISC: 3 refills | Status: AC

## 2024-01-14 NOTE — Telephone Encounter (Signed)
 Refill request received via fax from [facility] [employer] for Syringes  Last Fill: 10/11/2022  Next Visit: 04/09/2024  Last Visit: 11/08/2023  Dx: Rheumatoid arthritis of multiple sites with negative rheumatoid factor (HCC)   Current Dose per office note on 11/08/2023: for Methotrexate  injection  Okay to refill Syringes?

## 2024-02-05 DIAGNOSIS — G2581 Restless legs syndrome: Secondary | ICD-10-CM | POA: Diagnosis not present

## 2024-02-05 DIAGNOSIS — K219 Gastro-esophageal reflux disease without esophagitis: Secondary | ICD-10-CM | POA: Diagnosis not present

## 2024-02-05 DIAGNOSIS — G4733 Obstructive sleep apnea (adult) (pediatric): Secondary | ICD-10-CM | POA: Diagnosis not present

## 2024-02-05 DIAGNOSIS — I10 Essential (primary) hypertension: Secondary | ICD-10-CM | POA: Diagnosis not present

## 2024-02-14 DIAGNOSIS — H0102B Squamous blepharitis left eye, upper and lower eyelids: Secondary | ICD-10-CM | POA: Diagnosis not present

## 2024-02-14 DIAGNOSIS — H25813 Combined forms of age-related cataract, bilateral: Secondary | ICD-10-CM | POA: Diagnosis not present

## 2024-02-14 DIAGNOSIS — H2181 Floppy iris syndrome: Secondary | ICD-10-CM | POA: Diagnosis not present

## 2024-02-14 DIAGNOSIS — H35371 Puckering of macula, right eye: Secondary | ICD-10-CM | POA: Diagnosis not present

## 2024-02-14 LAB — OPHTHALMOLOGY REPORT-SCANNED

## 2024-02-23 ENCOUNTER — Other Ambulatory Visit: Payer: Self-pay | Admitting: Physician Assistant

## 2024-02-23 DIAGNOSIS — M0609 Rheumatoid arthritis without rheumatoid factor, multiple sites: Secondary | ICD-10-CM

## 2024-02-25 NOTE — Telephone Encounter (Signed)
 Last Fill: 11/28/2023  Labs: 09/05/2023 Glucose is 151. Rest of CMP WNL.   RBC count and hemoglobin are borderline low. Rest of CBC WNL.  We will continue to monitor closely.   Next Visit: 04/09/2024  Last Visit: 11/08/2023  DX:  Rheumatoid arthritis of multiple sites with negative rheumatoid factor (HCC)   Current Dose per office note 11/08/2023: Methotrexate  1.0 ml sq injections once weekly   Attempted to contact the patient and spoke with patient's wife, Devere, who is on the [facility]. Myles Devere the patient is well over due for labs and needs to come to the office to get his lab work done. Devere states she will tell him right away to come to the office to get them drawn. Orders already in order review for CBC and CMP.   Okay to refill Methotrexate ?

## 2024-02-28 ENCOUNTER — Other Ambulatory Visit: Payer: Self-pay | Admitting: Physician Assistant

## 2024-02-28 DIAGNOSIS — M0609 Rheumatoid arthritis without rheumatoid factor, multiple sites: Secondary | ICD-10-CM

## 2024-02-29 NOTE — Telephone Encounter (Signed)
 Last Fill: 12/17/2023  Eye exam: 11/10/2022    Labs: 09/05/2023 Glucose is 151. Rest of CMP WNL.   RBC count and hemoglobin are borderline low. Rest of CBC WNL.  We will continue to monitor closely.   Next Visit: 04/09/2024  Last Visit: 11/08/2023  DX: Rheumatoid arthritis of multiple sites with negative rheumatoid factor   Current Dose per office note on 11/08/2023: plaquenil  200 mg 1 tablet twice daily   Advised patient and his wife that patient is due to update labs and PLQ eye exam. Patient states he updated PLQ eye exam at [facility]. Standing lab orders are in place. I called [facility] to request recent eye exam.   Okay to refill Plaquenil ?

## 2024-03-03 DIAGNOSIS — H25811 Combined forms of age-related cataract, right eye: Secondary | ICD-10-CM | POA: Diagnosis not present

## 2024-03-14 ENCOUNTER — Other Ambulatory Visit: Payer: Self-pay | Admitting: *Deleted

## 2024-03-14 DIAGNOSIS — Z79899 Other long term (current) drug therapy: Secondary | ICD-10-CM

## 2024-03-14 LAB — CBC WITH DIFFERENTIAL/PLATELET
Absolute Lymphocytes: 1561 {cells}/uL (ref 850–3900)
Absolute Monocytes: 945 {cells}/uL (ref 200–950)
Basophils Absolute: 40 {cells}/uL (ref 0–200)
Basophils Relative: 0.6 %
Eosinophils Absolute: 228 {cells}/uL (ref 15–500)
Eosinophils Relative: 3.4 %
HCT: 45 % (ref 38.5–50.0)
Hemoglobin: 14.5 g/dL (ref 13.2–17.1)
MCH: 31.8 pg (ref 27.0–33.0)
MCHC: 32.2 g/dL (ref 32.0–36.0)
MCV: 98.7 fL (ref 80.0–100.0)
MPV: 9.6 fL (ref 7.5–12.5)
Monocytes Relative: 14.1 %
Neutro Abs: 3926 {cells}/uL (ref 1500–7800)
Neutrophils Relative %: 58.6 %
Platelets: 277 Thousand/uL (ref 140–400)
RBC: 4.56 Million/uL (ref 4.20–5.80)
RDW: 13.7 % (ref 11.0–15.0)
Total Lymphocyte: 23.3 %
WBC: 6.7 Thousand/uL (ref 3.8–10.8)

## 2024-03-14 LAB — COMPREHENSIVE METABOLIC PANEL WITH GFR
AG Ratio: 1.9 (calc) (ref 1.0–2.5)
ALT: 26 U/L (ref 9–46)
AST: 35 U/L (ref 10–35)
Albumin: 4.4 g/dL (ref 3.6–5.1)
Alkaline phosphatase (APISO): 70 U/L (ref 35–144)
BUN: 20 mg/dL (ref 7–25)
CO2: 32 mmol/L (ref 20–32)
Calcium: 9.8 mg/dL (ref 8.6–10.3)
Chloride: 101 mmol/L (ref 98–110)
Creat: 1.07 mg/dL (ref 0.70–1.28)
Globulin: 2.3 g/dL (ref 1.9–3.7)
Glucose, Bld: 89 mg/dL (ref 65–99)
Potassium: 4.9 mmol/L (ref 3.5–5.3)
Sodium: 139 mmol/L (ref 135–146)
Total Bilirubin: 0.4 mg/dL (ref 0.2–1.2)
Total Protein: 6.7 g/dL (ref 6.1–8.1)
eGFR: 74 mL/min/1.73m2 (ref >=60)

## 2024-03-17 ENCOUNTER — Ambulatory Visit: Payer: Self-pay | Admitting: Rheumatology

## 2024-03-17 ENCOUNTER — Other Ambulatory Visit: Payer: Self-pay | Admitting: *Deleted

## 2024-03-17 DIAGNOSIS — M0609 Rheumatoid arthritis without rheumatoid factor, multiple sites: Secondary | ICD-10-CM

## 2024-03-17 MED ORDER — SULFASALAZINE 500 MG PO TABS: 1000.0000 mg | ORAL_TABLET | Freq: Two times a day (BID) | ORAL | 0 refills | Status: DC

## 2024-03-17 NOTE — Telephone Encounter (Signed)
 Last Fill: 12/06/2023  Labs: 03/14/2024 CBC and CMP are normal.   Next Visit: 04/09/2024  Last Visit: 11/08/2023  DX: Rheumatoid arthritis of multiple sites with negative rheumatoid factor   Current Dose per office note 11/08/2023: sulfasalazine  500 mg 2 tablets po BID.   Okay to refill Sulfasalazine ?

## 2024-03-22 ENCOUNTER — Other Ambulatory Visit: Payer: Self-pay | Admitting: Physician Assistant

## 2024-03-22 DIAGNOSIS — M0609 Rheumatoid arthritis without rheumatoid factor, multiple sites: Secondary | ICD-10-CM

## 2024-03-22 HISTORY — PX: CATARACT EXTRACTION: SUR2

## 2024-03-24 ENCOUNTER — Encounter: Payer: Self-pay | Admitting: Radiology

## 2024-03-24 NOTE — Telephone Encounter (Signed)
 Last Fill: 02/25/2024  Labs: 03/14/2024 WNL  Next Visit: 04/09/2024  Last Visit: 11/08/2023  DX: Rheumatoid arthritis of multiple sites with negative rheumatoid factor (HCC)   Current Dose per office note 11/08/2023: Methotrexate  1.0 ml sq injections once weekly   Okay to refill Methotrexate ?

## 2024-03-25 DIAGNOSIS — H25811 Combined forms of age-related cataract, right eye: Secondary | ICD-10-CM | POA: Diagnosis not present

## 2024-03-25 DIAGNOSIS — H2181 Floppy iris syndrome: Secondary | ICD-10-CM | POA: Diagnosis not present

## 2024-03-25 DIAGNOSIS — H268 Other specified cataract: Secondary | ICD-10-CM | POA: Diagnosis not present

## 2024-03-26 NOTE — Progress Notes (Signed)
 Office Visit Note  Patient: David [name]             Date of Birth: [birth year]           MRN: [id number]             PCP: Crecencio Chiquita POUR, FNP Referring: Crecencio Chiquita POUR, FNP Visit Date: 04/09/2024 Occupation: Data Unavailable  Subjective:  Stiffness in both hands   History of Present Illness: David [name] is a 73 y.o. male with history of seronegative rheumatoid arthritis.  Patient remains on  Methotrexate  1.0 ml sq injections once weekly, folic acid  2 mg daily, plaquenil  200 mg 1 tablet twice daily, and sulfasalazine  500 mg 2 tablets po BID.   He continues to tolerate triple therapy without any side effects.  Patient recently held these medications for about 1 to 2 weeks due to undergoing right eye cataract surgery last week.  Patient states that the surgery was successful.  He has noted some increase stiffness in both hands especially first thing in the mornings.  Patient states that overall his right shoulder replacement has been doing well.  He is considering proceeding with a left total shoulder replacement next year.  If his symptoms persist or worsen he is planning to return to Dr. Darrick for cortisone injection in the meantime. He denies any recent or recurrent infections.  Activities of Daily Living:  Patient reports morning stiffness for 2-3 hours.   Patient Reports nocturnal pain.  Difficulty dressing/grooming: Reports Difficulty climbing stairs: Denies Difficulty getting out of chair: Denies Difficulty using hands for taps, buttons, cutlery, and/or writing: Reports  Review of Systems  Constitutional:  Negative for fatigue.  HENT:  Positive for mouth dryness. Negative for mouth sores.   Eyes:  Negative for dryness.  Respiratory:  Negative for shortness of breath.   Cardiovascular:  Negative for chest pain and palpitations.  Gastrointestinal:  Negative for blood in stool, constipation and diarrhea.  Endocrine: Positive for increased urination.  Genitourinary:   Negative for involuntary urination.  Musculoskeletal:  Positive for joint pain, gait problem, joint pain, joint swelling, myalgias, muscle weakness, morning stiffness, muscle tenderness and myalgias.  Skin:  Positive for sensitivity to sunlight. Negative for color change, rash and hair loss.  Allergic/Immunologic: Negative for susceptible to infections.  Neurological:  Negative for dizziness and headaches.  Hematological:  Negative for swollen glands.  Psychiatric/Behavioral:  Positive for sleep disturbance. Negative for depressed mood. The patient is not nervous/anxious.     PMFS History:  Patient Active Problem List   Diagnosis Date Noted   Rotator cuff arthropathy, right 08/21/2023   S/P reverse total shoulder arthroplasty, right 08/21/2023   Sepsis due to undetermined organism (HCC) 08/20/2022   Cervical spondylosis with radiculopathy 02/26/2020   SBO (small bowel obstruction) (HCC) 08/21/2017   Trochanteric bursitis of left hip 01/18/2017   Lateral epicondylitis, right elbow 01/18/2017   Rheumatoid arthritis (HCC) 03/15/2016   DJD (degenerative joint disease), cervical 03/15/2016   DDD (degenerative disc disease), lumbar 03/15/2016   HTN (hypertension) 03/15/2016   Obesity 03/15/2016   Elevated cholesterol 03/15/2016   Neuralgia 03/15/2016   Malignant melanoma of right side of neck (HCC) 03/15/2016   BBB (bundle branch block) 03/15/2016   High risk medication use 03/15/2016   Osteoarthritis of lumbar spine 03/15/2016   Primary osteoarthritis of both hands 03/15/2016   Primary osteoarthritis of both knees 03/15/2016   Mesenteric adenitis 06/29/2015    Past Medical History:  Diagnosis Date  Arthritis    Balance problem    Barrett's esophagus    BBB (bundle branch block) 03/15/2016   Carpal tunnel syndrome    DDD (degenerative disc disease), lumbar 03/15/2016   Depression    Dysrhythmia    had some tachycardia with steroids   Elevated cholesterol 03/15/2016    Headache(784.0)    Ocular Migraines - takes Ambien  for it   Heart murmur    HTN (hypertension) 03/15/2016   Hyperlipemia    Hypertension    Malignant melanoma of right side of neck (HCC) 03/15/2016   73 years old   Neuralgia 03/15/2016   Ocular migraine    Osteoarthritis    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    RLS (restless legs syndrome)    Sleep apnea    uses CPAP   Snores    Status post gastric banding    Wears glasses    driving    Family History  Problem Relation Age of Onset   Heart disease Mother    Alzheimer's disease Father    Past Surgical History:  Procedure Laterality Date   ANTERIOR CERVICAL DECOMPRESSION/DISCECTOMY FUSION 4 LEVELS N/A 02/26/2020   Procedure: ANTERIOR CERVICAL DECOMPRESSION/DISCECTOMY FUSION, INTERBODY PROSTHESIS, PLATE/SCREWS CERVICAL THREE-FOUR CERVICAL FOUR-FIVE CERVICAL FIVE-SIX- CERVICAL SIX-SEVEN;  Surgeon: Mavis Purchase, MD;  Location: [facility] OR;  Service: Neurosurgery;  Laterality: N/A;   BASAL CELL CARCINOMA EXCISION Right    right temple area   CARPAL TUNNEL RELEASE Right 09/26/2012   Procedure: CARPAL TUNNEL RELEASE;  Surgeon: Franky JONELLE Curia, MD;  Location: [facility];  Service: Orthopedics;  Laterality: Right;   CARPAL TUNNEL RELEASE Left 10/24/2012   Procedure: CARPAL TUNNEL RELEASE;  Surgeon: Franky JONELLE Curia, MD;  Location: [facility];  Service: Orthopedics;  Laterality: Left;   CATARACT EXTRACTION Right 03/2024   CIRCUMCISION     as a baby   EXCISION MELANOMA WITH SENTINEL LYMPH NODE BIOPSY  1984   rt neck with mulpipal nodes and some muscle excised rt neck-shoulder   KNEE ARTHROPLASTY     knee replacement     LAPAROSCOPIC GASTRIC BANDING  03/15/2009   LAPAROSCOPIC GASTRIC BANDING     MUSCLE BIOPSY  2004   rt thigh to r/o myocitis   nerve conduction velosity test     REVERSE SHOULDER ARTHROPLASTY Right 08/21/2023    Procedure: ARTHROPLASTY, SHOULDER, TOTAL, REVERSE;  Surgeon: Addie Cordella Hamilton, MD;  Location: [facility] OR;  Service: Orthopedics;  Laterality: Right;  RIGHT REVERSE SHOULDER ARTHROPLASTY   SHOULDER SURGERY Right 12/2021   TONSILLECTOMY     TOTAL KNEE ARTHROPLASTY Bilateral 12/05/2010   TRIGGER FINGER RELEASE Right 09/26/2012   Procedure: RELEASE TRIGGER FINGER/A-1 PULLEY RING AND SMALL ;  Surgeon: Franky JONELLE Curia, MD;  Location: [facility];  Service: Orthopedics;  Laterality: Right;   TRIGGER FINGER RELEASE Left 10/24/2012   Procedure: RELEASE TRIGGER FINGER/A-1 PULLEY LEFT MIDDLE AND LEFT RING;  Surgeon: Franky JONELLE Curia, MD;  Location: [facility];  Service: Orthopedics;  Laterality: Left;   UPPER GI ENDOSCOPY     Social History   Tobacco Use   Smoking status: Former    Current packs/day: 0.00    Average packs/day: 1 pack/day for 30.0 years (30.0 ttl pk-yrs)    Types: Cigarettes    Start date: 09/24/1971    Quit date: 09/23/2001    Years since quitting: 22.5  Passive exposure: Never   Smokeless tobacco: Never  Vaping Use   Vaping status: Never Used  Substance Use Topics   Alcohol use: Yes    Comment: maybe one drink once a year   Drug use: No   Social History   Social History Narrative   1 step daughter     Immunization History  Administered Date(s) Administered   Moderna Sars-Covid-2 Vaccination 06/10/2019, 07/08/2019   PFIZER(Purple Top)SARS-COV-2 Vaccination 05/21/2020     Objective: Vital Signs: BP 137/82   Pulse 76   Temp 98 F (36.7 C)   Resp 16   Ht 5' 8 (1.727 m)   Wt 252 lb 12.8 oz (114.7 kg)   BMI 38.44 kg/m    Physical Exam Vitals and nursing note reviewed.  Constitutional:      Appearance: He is well-developed.  HENT:     Head: Normocephalic and atraumatic.  Eyes:     Conjunctiva/sclera: Conjunctivae normal.     Pupils: Pupils are equal, round, and reactive to light.  Cardiovascular:     Rate and Rhythm: Normal rate and  regular rhythm.     Heart sounds: Normal heart sounds.  Pulmonary:     Effort: Pulmonary effort is normal.     Breath sounds: Normal breath sounds.  Abdominal:     General: Bowel sounds are normal.     Palpations: Abdomen is soft.  Musculoskeletal:     Cervical back: Normal range of motion and neck supple.  Skin:    General: Skin is warm and dry.     Capillary Refill: Capillary refill takes less than 2 seconds.  Neurological:     Mental Status: He is alert and oriented to person, place, and time.  Psychiatric:        Behavior: Behavior normal.      Musculoskeletal Exam: Patient remained seated during examination.  C-spine has limited range of motion.  Right shoulder replacement has limited abduction.  Left shoulder has limited internal rotation.  Right elbow flexion contracture.  Synovial thickening of both wrist joints.  Synovial thickening of all MCP, PIP, DIP joints.  No synovitis noted.  Both knee replacements have good range of motion with no warmth or effusion.  Ankle joints have good range of motion with no tenderness or joint swelling.  CDAI Exam: CDAI Score: -- Patient Global: --; Provider Global: -- Swollen: --; Tender: -- Joint Exam 04/09/2024   No joint exam has been documented for this visit   There is currently no information documented on the homunculus. Go to the Rheumatology activity and complete the homunculus joint exam.  Investigation: No additional findings.  Imaging: No results found.  Recent Labs: Lab Results  Component Value Date   WBC 6.7 03/14/2024   HGB 14.5 03/14/2024   PLT 277 03/14/2024   NA 139 03/14/2024   K 4.9 03/14/2024   CL 101 03/14/2024   CO2 32 03/14/2024   GLUCOSE 89 03/14/2024   BUN 20 03/14/2024   CREATININE 1.07 03/14/2024   BILITOT 0.4 03/14/2024   ALKPHOS 52 08/20/2022   AST 35 03/14/2024   ALT 26 03/14/2024   PROT 6.7 03/14/2024   ALBUMIN  3.1 (L) 08/20/2022   CALCIUM 9.8 03/14/2024   GFRAA 91 11/12/2020     Speciality Comments: PLQ Eye Exam: 11/10/2022 WNL @ [facility]. Follow up in 1 month for a Visual [employer].   Called to obtain visual field (12/27/22)-MG  Procedures:  No procedures performed Allergies: Oxycodone -acetaminophen , Codeine, Dilaudid  [hydromorphone   hcl], Fentanyl , Nucynta [tapentadol], and Penicillins   Assessment / Plan:     Visit Diagnoses: Rheumatoid arthritis of multiple sites with negative rheumatoid factor (HCC): He has no synovitis on examination today.  He has clinically been doing well on methotrexate  1.0 ml sq injections once weekly, folic acid  2 mg daily, Plaquenil  200 mg 1 tablet twice daily, and sulfasalazine  500 mg 2 tablets twice daily.  He is tolerating combination therapy without any side effects.  He recently held these medications for 1 to 2 weeks due to undergoing right eye cataract surgery last week.  Surgery was successful with no complications.  He has not had any recent or recurrent infections.  He has noticed some increase stiffness in both hands lasting several hours first thing in the mornings.  He has not noticed any joint swelling.  No inflammation was noted on exam.  He will remain on the current treatment regimen.  He was advised to notify us  if he develops any signs or symptoms of a flare.  He will follow-up in the office in 5 months or sooner if needed.  High risk medication use - Methotrexate  1.0 ml sq injections once weekly, folic acid  2 mg daily, plaquenil  200 mg 1 tablet twice daily, and sulfasalazine  500 mg 2 tablets po BID. CBC and CMP updated on 03/14/24.  His next lab work will be due in January and every 3 months to monitor for drug toxicity. PLQ Eye Exam: 11/10/2022 WNL @ [facility]. Follow up in 1 month for a [facility] [employer].  No recent or recurrent infections. Discussed the importance of holding methotrexate  and sulfasalazine  if he develops signs or symptoms of an infection and to resume once the infection has completely  cleared.  Status post reverse total shoulder replacement, right - August 21, 2023 by Dr. Addie.  Overall doing well.  Internal rotation has improved.  Primary osteoarthritis of both hands: He has PIP and DIP thickening consistent with osteoarthritis of both hands.  He has been experiencing increase stiffness in both hands first thing in the mornings.  Contracture of right elbow: No tenderness along the joint line.  Hx of total knee replacement, bilateral - Dr. Ernie.  Doing well.  No warmth or effusion.  DDD (degenerative disc disease), cervical - S/p fusion in 2021.  C-spine has limited range of motion with lateral rotation.  Degeneration of intervertebral disc of lumbar region without discogenic back pain or lower extremity pain: Limited mobility.  No symptoms of radiculopathy.  Elevated CK - CK 453 on 08/20/22. CK improved to 286 on 12/27/22.  No muscular weakness at this time.  Other medical conditions are listed as follows:  History of melanoma  History of sepsis  Impetigo  History of obesity  Orders: No orders of the defined types were placed in this encounter.  No orders of the defined types were placed in this encounter.   Follow-Up Instructions: Return in about 5 months (around 09/07/2024) for Rheumatoid arthritis.   David CHRISTELLA Craze, PA-C  Note - This record has been created using Dragon software.  Chart creation errors have been sought, but may not always  have been located. Such creation errors do not reflect on  the standard of medical care.

## 2024-04-07 ENCOUNTER — Other Ambulatory Visit: Payer: Self-pay | Admitting: Rheumatology

## 2024-04-07 NOTE — Telephone Encounter (Signed)
 Last Fill: 04/11/2023  Next Visit: 04/09/2024  Last Visit: 11/08/2023  DX: Rheumatoid arthritis of multiple sites with negative rheumatoid factor   Current Dose per office note on 11/08/2023: folic acid  2 mg daily   Okay to refill folic acid ?

## 2024-04-09 ENCOUNTER — Other Ambulatory Visit: Payer: Self-pay

## 2024-04-09 ENCOUNTER — Encounter: Payer: Self-pay | Admitting: Physician Assistant

## 2024-04-09 ENCOUNTER — Ambulatory Visit: Attending: Physician Assistant | Admitting: Physician Assistant

## 2024-04-09 VITALS — BP 137/82 | HR 76 | Temp 98.0°F | Resp 16 | Ht 68.0 in | Wt 252.8 lb

## 2024-04-09 DIAGNOSIS — L0100 Impetigo, unspecified: Secondary | ICD-10-CM

## 2024-04-09 DIAGNOSIS — Z96653 Presence of artificial knee joint, bilateral: Secondary | ICD-10-CM

## 2024-04-09 DIAGNOSIS — M0609 Rheumatoid arthritis without rheumatoid factor, multiple sites: Secondary | ICD-10-CM

## 2024-04-09 DIAGNOSIS — Z8639 Personal history of other endocrine, nutritional and metabolic disease: Secondary | ICD-10-CM

## 2024-04-09 DIAGNOSIS — Z79899 Other long term (current) drug therapy: Secondary | ICD-10-CM

## 2024-04-09 DIAGNOSIS — M19041 Primary osteoarthritis, right hand: Secondary | ICD-10-CM

## 2024-04-09 DIAGNOSIS — Z8619 Personal history of other infectious and parasitic diseases: Secondary | ICD-10-CM

## 2024-04-09 DIAGNOSIS — Z96611 Presence of right artificial shoulder joint: Secondary | ICD-10-CM | POA: Diagnosis not present

## 2024-04-09 DIAGNOSIS — M24521 Contracture, right elbow: Secondary | ICD-10-CM

## 2024-04-09 DIAGNOSIS — M51369 Other intervertebral disc degeneration, lumbar region without mention of lumbar back pain or lower extremity pain: Secondary | ICD-10-CM

## 2024-04-09 DIAGNOSIS — M19042 Primary osteoarthritis, left hand: Secondary | ICD-10-CM

## 2024-04-09 DIAGNOSIS — Z85820 Personal history of malignant melanoma of skin: Secondary | ICD-10-CM

## 2024-04-09 DIAGNOSIS — M5030 Other cervical disc degeneration, unspecified cervical region: Secondary | ICD-10-CM

## 2024-04-09 DIAGNOSIS — R748 Abnormal levels of other serum enzymes: Secondary | ICD-10-CM

## 2024-04-09 MED ORDER — HYDROXYCHLOROQUINE SULFATE 200 MG PO TABS: 200.0000 mg | ORAL_TABLET | Freq: Two times a day (BID) | ORAL | 0 refills | Status: AC

## 2024-04-09 NOTE — Progress Notes (Unsigned)
 Patient seen in office today, please review and sign. Pended a refill for Plaquenil  for 90 days, change to 30 days if needed.   Contacted [facility] and was advised the patient's last OCT was 11/10/2022 and his last VF was 03/21/2024,  they are faxing over eye exam. Contacted the patient and advised we are waiting on his exam to be faxed over and if they did not do the OCT we will need him to schedule one. Patient verbalized understanding.

## 2024-04-10 LAB — OPHTHALMOLOGY REPORT-SCANNED

## 2024-05-08 ENCOUNTER — Ambulatory Visit: Admitting: Surgical

## 2024-05-08 ENCOUNTER — Other Ambulatory Visit: Payer: Self-pay

## 2024-05-08 DIAGNOSIS — M75122 Complete rotator cuff tear or rupture of left shoulder, not specified as traumatic: Secondary | ICD-10-CM | POA: Diagnosis not present

## 2024-05-08 MED ORDER — BUPIVACAINE HCL 0.25 % IJ SOLN
9.0000 mL | INTRAMUSCULAR | Status: AC | PRN
  Administered 2024-05-08: 16:00:00 9 mL via INTRA_ARTICULAR

## 2024-05-08 MED ORDER — TRIAMCINOLONE ACETONIDE 40 MG/ML IJ SUSP
40.0000 mg | INTRAMUSCULAR | Status: AC | PRN
  Administered 2024-05-08: 16:00:00 40 mg via INTRA_ARTICULAR

## 2024-05-08 NOTE — Progress Notes (Cosign Needed)
 Office Visit Note   Patient: David [name]           Date of Birth: 1950/11/17           MRN: [id number] Visit Date: 05/08/2024 Requested by: Crecencio Chiquita POUR, FNP [address] [facility] [address] [place],  [place] [address] PCP: Crecencio, Chiquita POUR, FNP  Subjective: Chief Complaint  Patient presents with   Left Shoulder - Pain    HPI: David [name] is a 73 y.o. male who presents to the office reporting left shoulder pain.  Patient has history of partial-thickness cartilage loss of the glenohumeral joint with tendinosis of the bicep tendon that has progressed to rupture of the long head of the bicep tendon with Popeye deformity as well as complete tear of the subscap with 2.8 cm of retraction.  He is here today for consideration of left glenohumeral injection which gave him good relief about 6 months ago.  Has been bothering him again for several months without any history of injury.  Still recovering from right reverse shoulder arthroplasty and he feels he is still making progress every month with his shoulder replacement.  Wants to postpone his left shoulder replacement until his right shoulder makes maximal medical improvement.  Has pain in the anterior aspect of the shoulder and diffuse shoulder that radiates into the bicep region              ROS: All systems reviewed are negative as they relate to the chief complaint within the history of present illness.  Patient denies fevers or chills.  Assessment & Plan: Visit Diagnoses:  1. Complete tear of left rotator cuff, unspecified whether traumatic     Plan: Impression is 73 year old male who is here for irreparable subscap tear with partial-thickness glenohumeral arthritis.  We discussed options available to patient and he would like to repeat injection.  Injection administered under ultrasound guidance into the left glenohumeral joint.  We will see how this does.  If no improvement in the bicep region pain, could consider Toradol  injection in about 3  to 4 weeks locally to the area of the long head of the bicep tendon.  Follow-Up Instructions: No follow-ups on file.   Orders:  Orders Placed This Encounter  Procedures   US  Guided Needle Placement - No Linked Charges   No orders of the defined types were placed in this encounter.     Procedures: Large Joint Inj: L glenohumeral on 05/08/2024 3:47 PM Indications: pain and diagnostic evaluation Details: 22 G 3.5 in needle, ultrasound-guided posterior approach Medications: 9 mL bupivacaine  0.25 %; 40 mg triamcinolone  acetonide 40 MG/ML Outcome: tolerated well, no immediate complications Procedure, treatment alternatives, risks and benefits explained, specific risks discussed. Consent was given by the patient. Immediately prior to procedure a time out was called to verify the correct patient, procedure, equipment, support staff and site/side marked as required. Patient was prepped and draped in the usual sterile fashion.       Clinical Data: No additional findings.  Objective: Vital Signs: There were no vitals taken for this visit.  Physical Exam:  Constitutional: Patient appears well-developed HEENT:  Head: Normocephalic Eyes:EOM are normal Neck: Normal range of motion Cardiovascular: Normal rate Pulmonary/chest: Effort normal Neurologic: Patient is alert Skin: Skin is warm Psychiatric: Patient has normal mood and affect  Ortho Exam: Ortho exam demonstrates left shoulder with 60 degrees X rotation, 80 degrees abduction, 160 degrees forward elevation passively.  Similar active range of motion.  Axillary  nerve intact with deltoid firing.  Intact EPL, FPL, finger abduction, pronation/supination, bicep, tricep, deltoid.  Tenderness over the anterior aspect of the glenohumeral joint as well as tenderness in the proximal aspect of the bicep muscle at the site of his Popeye deformity.  No supination weakness.  No bicep flexion weakness.  Decent internal rotation strength rated 5 -/5  of and his history of subscap injury.  No AC joint tenderness.  Specialty Comments:  No specialty comments available.  Imaging: No results found.   PMFS History: Patient Active Problem List   Diagnosis Date Noted   Rotator cuff arthropathy, right 08/21/2023   S/P reverse total shoulder arthroplasty, right 08/21/2023   Sepsis due to undetermined organism (HCC) 08/20/2022   Cervical spondylosis with radiculopathy 02/26/2020   SBO (small bowel obstruction) (HCC) 08/21/2017   Trochanteric bursitis of left hip 01/18/2017   Lateral epicondylitis, right elbow 01/18/2017   Rheumatoid arthritis (HCC) 03/15/2016   DJD (degenerative joint disease), cervical 03/15/2016   DDD (degenerative disc disease), lumbar 03/15/2016   HTN (hypertension) 03/15/2016   Obesity 03/15/2016   Elevated cholesterol 03/15/2016   Neuralgia 03/15/2016   Malignant melanoma of right side of neck (HCC) 03/15/2016   BBB (bundle branch block) 03/15/2016   High risk medication use 03/15/2016   Osteoarthritis of lumbar spine 03/15/2016   Primary osteoarthritis of both hands 03/15/2016   Primary osteoarthritis of both knees 03/15/2016   Mesenteric adenitis 06/29/2015   Past Medical History:  Diagnosis Date   Arthritis    Balance problem    Barrett's esophagus    BBB (bundle branch block) 03/15/2016   Carpal tunnel syndrome    DDD (degenerative disc disease), lumbar 03/15/2016   Depression    Dysrhythmia    had some tachycardia with steroids   Elevated cholesterol 03/15/2016   Headache(784.0)    Ocular Migraines - takes Ambien  for it   Heart murmur    HTN (hypertension) 03/15/2016   Hyperlipemia    Hypertension    Malignant melanoma of right side of neck (HCC) 03/15/2016   73 years old   Neuralgia 03/15/2016   Ocular migraine    Osteoarthritis    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    RLS (restless legs syndrome)    Sleep apnea     uses CPAP   Snores    Status post gastric banding    Wears glasses    driving    Family History  Problem Relation Age of Onset   Heart disease Mother    Alzheimer's disease Father     Past Surgical History:  Procedure Laterality Date   ANTERIOR CERVICAL DECOMPRESSION/DISCECTOMY FUSION 4 LEVELS N/A 02/26/2020   Procedure: ANTERIOR CERVICAL DECOMPRESSION/DISCECTOMY FUSION, INTERBODY PROSTHESIS, PLATE/SCREWS CERVICAL THREE-FOUR CERVICAL FOUR-FIVE CERVICAL FIVE-SIX- CERVICAL SIX-SEVEN;  Surgeon: Mavis Purchase, MD;  Location: [facility];  Service: Neurosurgery;  Laterality: N/A;   BASAL CELL CARCINOMA EXCISION Right    right temple area   CARPAL TUNNEL RELEASE Right 09/26/2012   Procedure: CARPAL TUNNEL RELEASE;  Surgeon: Franky JONELLE Curia, MD;  Location: [facility];  Service: Orthopedics;  Laterality: Right;   CARPAL TUNNEL RELEASE Left 10/24/2012   Procedure: CARPAL TUNNEL RELEASE;  Surgeon: Franky JONELLE Curia, MD;  Location: [facility];  Service: Orthopedics;  Laterality: Left;   CATARACT EXTRACTION Right 03/2024   CIRCUMCISION     as a baby   EXCISION  MELANOMA WITH SENTINEL LYMPH NODE BIOPSY  1984   rt neck with mulpipal nodes and some muscle excised rt neck-shoulder   KNEE ARTHROPLASTY     knee replacement     LAPAROSCOPIC GASTRIC BANDING  03/15/2009   LAPAROSCOPIC GASTRIC BANDING     MUSCLE BIOPSY  2004   rt thigh to r/o myocitis   nerve conduction velosity test     REVERSE SHOULDER ARTHROPLASTY Right 08/21/2023   Procedure: ARTHROPLASTY, SHOULDER, TOTAL, REVERSE;  Surgeon: Addie Cordella Hamilton, MD;  Location: [facility] OR;  Service: Orthopedics;  Laterality: Right;  RIGHT REVERSE SHOULDER ARTHROPLASTY   SHOULDER SURGERY Right 12/2021   TONSILLECTOMY     TOTAL KNEE ARTHROPLASTY Bilateral 12/05/2010   TRIGGER FINGER RELEASE Right 09/26/2012   Procedure: RELEASE TRIGGER FINGER/A-1 PULLEY RING AND SMALL ;  Surgeon: Franky JONELLE Curia, MD;  Location: [facility];  Service: Orthopedics;  Laterality: Right;   TRIGGER FINGER RELEASE Left 10/24/2012   Procedure: RELEASE TRIGGER FINGER/A-1 PULLEY LEFT MIDDLE AND LEFT RING;  Surgeon: Franky JONELLE Curia, MD;  Location: [facility];  Service: Orthopedics;  Laterality: Left;   UPPER GI ENDOSCOPY     Social History   Occupational History   Not on file  Tobacco Use   Smoking status: Former    Current packs/day: 0.00    Average packs/day: 1 pack/day for 30.0 years (30.0 ttl pk-yrs)    Types: Cigarettes    Start date: 09/24/1971    Quit date: 09/23/2001    Years since quitting: 22.6    Passive exposure: Never   Smokeless tobacco: Never  Vaping Use   Vaping status: Never Used  Substance and Sexual Activity   Alcohol use: Yes    Comment: maybe one drink once a year   Drug use: No   Sexual activity: Not on file

## 2024-06-11 ENCOUNTER — Other Ambulatory Visit: Payer: Self-pay | Admitting: Physician Assistant

## 2024-06-11 DIAGNOSIS — M0609 Rheumatoid arthritis without rheumatoid factor, multiple sites: Secondary | ICD-10-CM

## 2024-06-11 NOTE — Telephone Encounter (Signed)
 Last Fill: 03/24/2024  Labs: 03/14/2024 CBC/CMP WNL  Next Visit: 09/08/2024  Last Visit: 04/09/2024  DX: Rheumatoid arthritis of multiple sites with negative rheumatoid factor   Current Dose per office note 04/09/2024: Methotrexate  1.0 ml sq injections once weekly   Patient's wife advised patient is due to update labs. She will remind patient.   Okay to refill Methotrexate ?

## 2024-06-24 ENCOUNTER — Other Ambulatory Visit: Payer: Self-pay | Admitting: Rheumatology

## 2024-06-24 DIAGNOSIS — Z79899 Other long term (current) drug therapy: Secondary | ICD-10-CM

## 2024-06-24 DIAGNOSIS — M0609 Rheumatoid arthritis without rheumatoid factor, multiple sites: Secondary | ICD-10-CM

## 2024-06-25 ENCOUNTER — Other Ambulatory Visit: Payer: Self-pay

## 2024-06-25 DIAGNOSIS — Z79899 Other long term (current) drug therapy: Secondary | ICD-10-CM

## 2024-06-25 NOTE — Telephone Encounter (Signed)
 Last Fill: 03/17/2024  Labs: 03/14/2024 normal  Next Visit: 09/08/2024  Last Visit: 04/09/2024  DX: Rheumatoid arthritis of multiple sites with negative rheumatoid factor (HCC)   Current Dose per office note 04/09/2024: sulfasalazine  500 mg 2 tablets po BID.   Contacted the patient and advised David [name] is due to update labs. Patient states David [name] plans to update labs today or as soon as David [name] can. Will pend CBC and CMP.   Okay to refill Sulfasalazine ?

## 2024-06-26 ENCOUNTER — Ambulatory Visit: Payer: Self-pay | Admitting: Physician Assistant

## 2024-06-26 LAB — COMPREHENSIVE METABOLIC PANEL WITH GFR
AG Ratio: 1.8 (calc) (ref 1.0–2.5)
ALT: 20 U/L (ref 9–46)
AST: 24 U/L (ref 10–35)
Albumin: 4.3 g/dL (ref 3.6–5.1)
Alkaline phosphatase (APISO): 74 U/L (ref 35–144)
BUN: 14 mg/dL (ref 7–25)
CO2: 31 mmol/L (ref 20–32)
Calcium: 9.4 mg/dL (ref 8.6–10.3)
Chloride: 100 mmol/L (ref 98–110)
Creat: 1.07 mg/dL (ref 0.70–1.28)
Globulin: 2.4 g/dL (ref 1.9–3.7)
Glucose, Bld: 96 mg/dL (ref 65–99)
Potassium: 4.4 mmol/L (ref 3.5–5.3)
Sodium: 138 mmol/L (ref 135–146)
Total Bilirubin: 0.4 mg/dL (ref 0.2–1.2)
Total Protein: 6.7 g/dL (ref 6.1–8.1)
eGFR: 74 mL/min/{1.73_m2}

## 2024-06-26 LAB — CBC WITH DIFFERENTIAL/PLATELET
Absolute Lymphocytes: 2254 {cells}/uL (ref 850–3900)
Absolute Monocytes: 878 {cells}/uL (ref 200–950)
Basophils Absolute: 58 {cells}/uL (ref 0–200)
Basophils Relative: 0.8 %
Eosinophils Absolute: 144 {cells}/uL (ref 15–500)
Eosinophils Relative: 2 %
HCT: 45.5 % (ref 39.4–51.1)
Hemoglobin: 14.6 g/dL (ref 13.2–17.1)
MCH: 30.6 pg (ref 27.0–33.0)
MCHC: 32.1 g/dL (ref 31.6–35.4)
MCV: 95.4 fL (ref 81.4–101.7)
MPV: 9.6 fL (ref 7.5–12.5)
Monocytes Relative: 12.2 %
Neutro Abs: 3866 {cells}/uL (ref 1500–7800)
Neutrophils Relative %: 53.7 %
Platelets: 274 10*3/uL (ref 140–400)
RBC: 4.77 Million/uL (ref 4.20–5.80)
RDW: 13.9 % (ref 11.0–15.0)
Total Lymphocyte: 31.3 %
WBC: 7.2 10*3/uL (ref 3.8–10.8)

## 2024-06-26 NOTE — Progress Notes (Signed)
 CBC and CMP WNL

## 2024-09-08 ENCOUNTER — Ambulatory Visit: Admitting: Physician Assistant
